# Patient Record
Sex: Female | Born: 1960 | Race: White | Hispanic: No | Marital: Single | Smoking: Never smoker
Health system: Southern US, Community
[De-identification: ages and names within clinical notes are randomized; demographics above are authoritative.]

## PROBLEM LIST (undated history)

## (undated) DIAGNOSIS — E785 Hyperlipidemia, unspecified: Secondary | ICD-10-CM

## (undated) DIAGNOSIS — I639 Cerebral infarction, unspecified: Secondary | ICD-10-CM

## (undated) DIAGNOSIS — H547 Unspecified visual loss: Secondary | ICD-10-CM

## (undated) DIAGNOSIS — I219 Acute myocardial infarction, unspecified: Secondary | ICD-10-CM

## (undated) DIAGNOSIS — E119 Type 2 diabetes mellitus without complications: Secondary | ICD-10-CM

## (undated) DIAGNOSIS — I10 Essential (primary) hypertension: Secondary | ICD-10-CM

## (undated) DIAGNOSIS — Z8669 Personal history of other diseases of the nervous system and sense organs: Secondary | ICD-10-CM

## (undated) HISTORY — PX: CARDIAC CATHETERIZATION: SHX172

## (undated) HISTORY — PX: TOTAL ABDOMINAL HYSTERECTOMY: SHX209

## (undated) HISTORY — PX: SKIN GRAFT: SHX250

## (undated) HISTORY — PX: FRACTURE SURGERY: SHX138

## (undated) HISTORY — PX: BACK SURGERY: SHX140

## (undated) HISTORY — PX: ABDOMINAL HYSTERECTOMY: SHX81

---

## 2011-02-07 DIAGNOSIS — D069 Carcinoma in situ of cervix, unspecified: Secondary | ICD-10-CM

## 2011-02-07 DIAGNOSIS — E782 Mixed hyperlipidemia: Secondary | ICD-10-CM | POA: Insufficient documentation

## 2011-02-07 DIAGNOSIS — R55 Syncope and collapse: Secondary | ICD-10-CM | POA: Insufficient documentation

## 2011-02-07 DIAGNOSIS — G43719 Chronic migraine without aura, intractable, without status migrainosus: Secondary | ICD-10-CM | POA: Insufficient documentation

## 2011-02-07 DIAGNOSIS — IMO0001 Reserved for inherently not codable concepts without codable children: Secondary | ICD-10-CM | POA: Insufficient documentation

## 2011-02-07 DIAGNOSIS — G4700 Insomnia, unspecified: Secondary | ICD-10-CM | POA: Insufficient documentation

## 2011-02-07 DIAGNOSIS — F411 Generalized anxiety disorder: Secondary | ICD-10-CM | POA: Insufficient documentation

## 2011-02-07 DIAGNOSIS — G4733 Obstructive sleep apnea (adult) (pediatric): Secondary | ICD-10-CM | POA: Insufficient documentation

## 2011-02-07 HISTORY — DX: Carcinoma in situ of cervix, unspecified: D06.9

## 2012-04-02 DIAGNOSIS — Z86711 Personal history of pulmonary embolism: Secondary | ICD-10-CM | POA: Insufficient documentation

## 2012-04-02 DIAGNOSIS — I2699 Other pulmonary embolism without acute cor pulmonale: Secondary | ICD-10-CM | POA: Insufficient documentation

## 2012-04-02 HISTORY — DX: Personal history of pulmonary embolism: Z86.711

## 2012-04-03 DIAGNOSIS — I82409 Acute embolism and thrombosis of unspecified deep veins of unspecified lower extremity: Secondary | ICD-10-CM | POA: Insufficient documentation

## 2012-04-03 HISTORY — DX: Acute embolism and thrombosis of unspecified deep veins of unspecified lower extremity: I82.409

## 2012-06-04 DIAGNOSIS — E669 Obesity, unspecified: Secondary | ICD-10-CM | POA: Insufficient documentation

## 2012-06-04 DIAGNOSIS — E66812 Obesity, class 2: Secondary | ICD-10-CM | POA: Insufficient documentation

## 2012-06-04 DIAGNOSIS — R002 Palpitations: Secondary | ICD-10-CM | POA: Insufficient documentation

## 2012-07-07 DIAGNOSIS — M25511 Pain in right shoulder: Secondary | ICD-10-CM | POA: Insufficient documentation

## 2012-07-07 DIAGNOSIS — M7541 Impingement syndrome of right shoulder: Secondary | ICD-10-CM | POA: Insufficient documentation

## 2013-06-10 DIAGNOSIS — E039 Hypothyroidism, unspecified: Secondary | ICD-10-CM | POA: Diagnosis present

## 2013-07-11 DIAGNOSIS — I10 Essential (primary) hypertension: Secondary | ICD-10-CM | POA: Diagnosis present

## 2013-08-22 DIAGNOSIS — M25562 Pain in left knee: Secondary | ICD-10-CM | POA: Insufficient documentation

## 2013-08-22 DIAGNOSIS — Z79899 Other long term (current) drug therapy: Secondary | ICD-10-CM | POA: Insufficient documentation

## 2013-10-27 DIAGNOSIS — I214 Non-ST elevation (NSTEMI) myocardial infarction: Secondary | ICD-10-CM

## 2013-10-27 HISTORY — DX: Non-ST elevation (NSTEMI) myocardial infarction: I21.4

## 2013-12-02 ENCOUNTER — Encounter: Payer: Self-pay | Admitting: Family

## 2014-01-22 DIAGNOSIS — E1169 Type 2 diabetes mellitus with other specified complication: Secondary | ICD-10-CM | POA: Insufficient documentation

## 2014-03-03 DIAGNOSIS — I639 Cerebral infarction, unspecified: Secondary | ICD-10-CM | POA: Insufficient documentation

## 2014-04-22 DIAGNOSIS — H47619 Cortical blindness, unspecified side of brain: Secondary | ICD-10-CM

## 2014-06-13 ENCOUNTER — Emergency Department: Payer: Self-pay | Admitting: Emergency Medicine

## 2014-06-13 LAB — URINALYSIS, COMPLETE
BILIRUBIN, UR: NEGATIVE
Glucose,UR: NEGATIVE mg/dL (ref 0–75)
Ketone: NEGATIVE
Nitrite: NEGATIVE
Ph: 5 (ref 4.5–8.0)
Protein: 100
RBC,UR: 6504 /HPF (ref 0–5)
SQUAMOUS EPITHELIAL: NONE SEEN
Specific Gravity: 1.012 (ref 1.003–1.030)
WBC UR: 149 /HPF (ref 0–5)

## 2014-06-14 ENCOUNTER — Emergency Department: Payer: Self-pay | Admitting: Emergency Medicine

## 2014-06-15 LAB — URINE CULTURE

## 2014-06-30 ENCOUNTER — Emergency Department: Payer: Self-pay | Admitting: Emergency Medicine

## 2014-07-01 LAB — URINALYSIS, COMPLETE
BILIRUBIN, UR: NEGATIVE
Bacteria: NONE SEEN
Glucose,UR: NEGATIVE mg/dL (ref 0–75)
Ketone: NEGATIVE
Nitrite: NEGATIVE
PH: 8 (ref 4.5–8.0)
Protein: NEGATIVE
RBC,UR: 55 /HPF (ref 0–5)
Specific Gravity: 1.012 (ref 1.003–1.030)
Squamous Epithelial: <1

## 2014-07-03 ENCOUNTER — Emergency Department: Payer: Self-pay | Admitting: Emergency Medicine

## 2014-07-03 LAB — URINE CULTURE

## 2014-08-02 ENCOUNTER — Emergency Department: Payer: Self-pay | Admitting: Emergency Medicine

## 2014-08-02 LAB — URINALYSIS, COMPLETE
Bacteria: NONE SEEN
Bilirubin,UR: NEGATIVE
Glucose,UR: NEGATIVE mg/dL (ref 0–75)
Ketone: NEGATIVE
Nitrite: NEGATIVE
Ph: 5 (ref 4.5–8.0)
Protein: 30
RBC,UR: 64 /HPF (ref 0–5)
Specific Gravity: 1.012 (ref 1.003–1.030)
Squamous Epithelial: 2
WBC UR: 408 /HPF (ref 0–5)

## 2014-08-05 ENCOUNTER — Emergency Department: Payer: Self-pay | Admitting: Emergency Medicine

## 2014-08-05 LAB — URINE CULTURE

## 2014-08-05 LAB — URINALYSIS, COMPLETE
Bilirubin,UR: NEGATIVE
GLUCOSE, UR: NEGATIVE mg/dL (ref 0–75)
Ketone: NEGATIVE
NITRITE: NEGATIVE
PH: 8 (ref 4.5–8.0)
Specific Gravity: 1.026 (ref 1.003–1.030)

## 2014-09-18 ENCOUNTER — Emergency Department: Payer: Self-pay | Admitting: Emergency Medicine

## 2014-12-18 DIAGNOSIS — K219 Gastro-esophageal reflux disease without esophagitis: Secondary | ICD-10-CM | POA: Insufficient documentation

## 2014-12-21 ENCOUNTER — Emergency Department: Payer: Medicare Other

## 2014-12-21 ENCOUNTER — Other Ambulatory Visit: Payer: Self-pay

## 2014-12-21 ENCOUNTER — Encounter: Payer: Self-pay | Admitting: *Deleted

## 2014-12-21 ENCOUNTER — Emergency Department
Admission: EM | Admit: 2014-12-21 | Discharge: 2014-12-21 | Disposition: A | Discharge status: Home / Self Care | Payer: Medicare Other | Attending: Emergency Medicine | Admitting: Emergency Medicine

## 2014-12-21 DIAGNOSIS — I10 Essential (primary) hypertension: Secondary | ICD-10-CM | POA: Insufficient documentation

## 2014-12-21 DIAGNOSIS — E119 Type 2 diabetes mellitus without complications: Secondary | ICD-10-CM | POA: Insufficient documentation

## 2014-12-21 DIAGNOSIS — M6281 Muscle weakness (generalized): Secondary | ICD-10-CM | POA: Diagnosis present

## 2014-12-21 DIAGNOSIS — G459 Transient cerebral ischemic attack, unspecified: Secondary | ICD-10-CM | POA: Diagnosis not present

## 2014-12-21 HISTORY — DX: Cerebral infarction, unspecified: I63.9

## 2014-12-21 HISTORY — DX: Essential (primary) hypertension: I10

## 2014-12-21 HISTORY — DX: Type 2 diabetes mellitus without complications: E11.9

## 2014-12-21 NOTE — ED Provider Notes (Signed)
[facility] Emergency Department Provider Note  ____________________________________________  Time seen: 10:30 AM  I have reviewed the triage vital signs and the nursing notes.   HISTORY  Chief Complaint Weakness     HPI Victoria Medina is a 54 y.o. female who presents after having an episode of worsening numbness on her left side today. 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 Today she noted to the nurse that the left side of her body felt more numb than usual but had no motor changes. She reports she has a chronic left facial droop. She is angry that she is in the emergency department she feels the nurse overreacted. She feels at baseline currently. Discussed with nurse at [facility] who also agrees chronic left facial droop     Past Medical History  Diagnosis Date  . Stroke   . Diabetes mellitus without complication   . Hypertension     There are no active problems to display for this patient.   History reviewed. No pertinent past surgical history.  No current outpatient prescriptions on file.  Allergies Review of patient's allergies indicates no known allergies.  No family history on file.  Social History History  Substance Use Topics  . Smoking status: Never Smoker   . Smokeless tobacco: Not on file  . Alcohol Use: No    Review of Systems  Constitutional: Negative for fever. Eyes: Legally blind ENT: Negative for sore throat. Cardiovascular: Negative for chest pain. Respiratory: Negative for shortness of breath. Gastrointestinal: Negative for abdominal pain, vomiting and diarrhea. Genitourinary: Negative for dysuria. Musculoskeletal: Negative for back pain. Skin: Negative for rash. Neurological: Negative for headaches, left-sided numbness resolved Psychiatric: No anxiety or depression  10-point ROS otherwise  negative.  ____________________________________________   PHYSICAL EXAM:  VITAL SIGNS: BP 144/90 mmHg  Pulse 72  Temp(Src) 98 F (36.7 C) (Oral)  Resp 22  SpO2 99%   ED Triage Vitals  Enc Vitals Group     BP --      Pulse --      Resp --      Temp --      Temp src --      SpO2 --      Weight --      Height --      Head Cir --      Peak Flow --      Pain Score --      Pain Loc --      Pain Edu? --      Excl. in GC? --      Constitutional: Alert and oriented. Well appearing and in no distress. Eyes: Conjunctivae are normal. Normal extraocular movements. ENT   Head: Normocephalic and atraumatic.   Nose: No congestion/rhinnorhea.   Mouth/Throat: Mucous membranes are moist.   Neck: No stridor. Hematological/Lymphatic/Immunilogical: No cervical lymphadenopathy. Cardiovascular: Normal rate, regular rhythm. Normal and symmetric distal pulses are present in all extremities. No murmurs, rubs, or gallops. Respiratory: Normal respiratory effort without tachypnea nor retractions. Breath sounds are clear and equal bilaterally. No wheezes/rales/rhonchi. Gastrointestinal: Soft and nontender. No distention. There is no CVA tenderness. Genitourinary: deferred Musculoskeletal: Nontender with normal range of motion in all extremities. No joint effusions.  No lower extremity tenderness nor edema. Neurologic:  Normal speech and language. Patient with left lower facial droop which probably is chronic. Speech is normal.  Skin:  Skin is warm, dry and intact. No rash noted. Psychiatric: Mood and affect are normal. Speech and behavior are normal. Patient exhibits appropriate insight and judgment.  ____________________________________________    LABS (pertinent positives/negatives)  Patient refuses to allow blood draw  ____________________________________________   EKG Interpreted by me  Date: 12/21/2014  Rate: 67  Rhythm: normal sinus rhythm  QRS Axis: normal   Intervals: normal  ST/T Wave abnormalities: normal  Conduction Disutrbances: none  Narrative Interpretation: unremarkable      ____________________________________________    RADIOLOGY  CT head with age indeterminate infarct  ____________________________________________   PROCEDURES  Procedure(s) performed:None  Critical Care performed: None    ____________________________________________   INITIAL IMPRESSION / ASSESSMENT AND PLAN / ED COURSE  Pertinent labs & imaging results that were available during my care of the patient were reviewed by me and considered in my medical decision making (see chart for details).  Patient does not feel that she has had a stroke and I don't believe so either. She is adamant that she feels at baseline. Apparently her mother is coming and I will discuss with her when she arrives  Patient is not a TPA candidate because minor if any symptoms have already resolved ____________________________________________ Discussed with neurology Dr. Hermenia Bers who notes that given no symptoms and emergency department, and dual antiplatelet therapy and eliquis appropriate blood pressure and patient's resistance to stay in the hospital ok for discharge. Patient has follow-up with her neurologist this week  FINAL CLINICAL IMPRESSION(S) / ED DIAGNOSES  Final diagnoses:  Transient cerebral ischemia, unspecified transient cerebral ischemia type     Jene Every, MD 12/21/14 1219

## 2014-12-21 NOTE — ED Notes (Signed)
Per EMS pt from [facility] with c/o left side weakness, unsure exact onset. Hx of previous stroke. VSS. Possible A-Fib on monitor. BG 333, no hx of diabetes.

## 2014-12-21 NOTE — Discharge Instructions (Signed)
Transient Ischemic Attack °A transient ischemic attack (TIA) is a "warning stroke" that causes stroke-like symptoms. A TIA does not cause lasting damage to the brain. It is important to know when to get help and what to do to prevent stroke or death.  °HOME CARE  °· Take all medicines exactly as told by your doctor. Understand all your medicine instructions. °· You may need to take aspirin or warfarin medicine. Take warfarin exactly as told. °¨ Taking too much or too little warfarin is dangerous. Blood tests must be done as often as told by your doctor. These blood tests help your doctor make sure the amount of warfarin you are taking is right. A PT blood test measures how long it takes for blood to clot. Your PT is used to calculate another value called an INR. Your PT and INR help your doctor adjust your warfarin dosage. °¨ Food can cause problems with warfarin and affect the results of your blood tests. This is true for foods high in vitamin K. Spinach, kale, broccoli, cabbage, collard and turnip greens, Brussels sprouts, peas, cauliflower, seaweed, and parsley are high in vitamin K as well as beef and pork liver, green tea, and soybean oil. Eat the same amount of food high in vitamin K. Avoid major changes in your diet. Tell your doctor before changing your diet. Talk to a food specialist (dietitian) if you have questions. °¨ Many medicines can cause problems with warfarin and affect your PT and INR. Tell your doctor about all medicines you take. This includes vitamins and dietary pills (supplements). Be careful with aspirin and medicines that relieve redness, soreness, and puffiness (inflammation). Do not take or stop medicines unless your doctor tells you to. °¨ Warfarin can cause a lot of bruising or bleeding. Hold pressure over cuts for longer than normal. Talk to your doctor about other side effects of warfarin. °¨ Avoid sports or activities that may cause injury or bleeding. °¨ Be careful when you shave,  floss your teeth, or use sharp objects. °¨ Avoid alcoholic drinks or drink very little alcohol while taking warfarin. Tell your doctor if you change how much alcohol you drink. °¨ Tell your dentist and other doctors that you take warfarin before procedures. °· Eat 5 or more servings of fruits and vegetables a day. °· Follow your diet program as told, if you are given one. °· Keep a healthy weight. °· Stay active. Try to get at least 30 minutes of activity on most or all days. °· Do not smoke. °· Limit how much alcohol you drink even if you are not taking warfarin. Moderate alcohol use is: °¨ No more than 2 drinks each day for men. °¨ No more than 1 drink each day for women who are not pregnant. °· Stop abusing drugs. °· Keep your home safe so you do not fall. Try: °¨ Putting grab bars in the bedroom and bathroom. °¨ Raising toilet seats. °¨ Putting a seat in the shower. °· Keep all doctor visits a told. °GET HELP IF: °· Your personality changes. °· You have trouble swallowing. °· You are seeing two of everything. °· You are dizzy. °· You have a fever. °· Your skin starts to break down. °GET HELP RIGHT AWAY IF:  °The symptoms below may be a sign of an emergency. Do not wait to see if the symptoms go away. Call for help (911 in [place] Do not drive yourself to the hospital. °· You have sudden weakness or numbness on   the face, arm, or leg (especially on one side of the body). °· You have sudden trouble walking or moving your arms or legs. °· You have sudden confusion. °· You have trouble talking or understanding. °· You have sudden trouble seeing in one or both eyes. °· You lose your balance or your movements are not smooth. °· You have a sudden, severe headache with no known cause. °· You have new chest pain or you feel your heart beating in a unsteady way. °· You are partly or totally unaware of what is going on around you. °MAKE SURE YOU:  °· Understand these instructions. °· Will watch your condition. °· Will get  help right away if you are not doing well or get worse. °Document Released: 04/25/2008 Document Revised: 12/01/2013 Document Reviewed: 10/22/2013 °ExitCare® Patient Information ©2015 [employer]. This information is not intended to replace advice given to you by your health care provider. Make sure you discuss any questions you have with your health care provider. ° °

## 2015-02-22 ENCOUNTER — Other Ambulatory Visit: Payer: Self-pay

## 2015-02-22 ENCOUNTER — Emergency Department: Payer: Medicare Other

## 2015-02-22 ENCOUNTER — Emergency Department
Admission: EM | Admit: 2015-02-22 | Discharge: 2015-02-22 | Disposition: A | Discharge status: Other Acute Inpatient Hospital | Payer: Medicare Other | Attending: Emergency Medicine | Admitting: Emergency Medicine

## 2015-02-22 DIAGNOSIS — M6281 Muscle weakness (generalized): Secondary | ICD-10-CM | POA: Diagnosis not present

## 2015-02-22 DIAGNOSIS — I69328 Other speech and language deficits following cerebral infarction: Secondary | ICD-10-CM | POA: Insufficient documentation

## 2015-02-22 DIAGNOSIS — I10 Essential (primary) hypertension: Secondary | ICD-10-CM | POA: Diagnosis not present

## 2015-02-22 DIAGNOSIS — N390 Urinary tract infection, site not specified: Secondary | ICD-10-CM | POA: Insufficient documentation

## 2015-02-22 DIAGNOSIS — E119 Type 2 diabetes mellitus without complications: Secondary | ICD-10-CM | POA: Insufficient documentation

## 2015-02-22 DIAGNOSIS — R410 Disorientation, unspecified: Secondary | ICD-10-CM | POA: Insufficient documentation

## 2015-02-22 DIAGNOSIS — I69398 Other sequelae of cerebral infarction: Secondary | ICD-10-CM | POA: Insufficient documentation

## 2015-02-22 DIAGNOSIS — R4182 Altered mental status, unspecified: Secondary | ICD-10-CM | POA: Diagnosis present

## 2015-02-22 DIAGNOSIS — R479 Unspecified speech disturbances: Secondary | ICD-10-CM

## 2015-02-22 DIAGNOSIS — F809 Developmental disorder of speech and language, unspecified: Secondary | ICD-10-CM

## 2015-02-22 LAB — URINALYSIS COMPLETE WITH MICROSCOPIC (ARMC ONLY)
Bilirubin Urine: NEGATIVE
Glucose, UA: NEGATIVE mg/dL
HGB URINE DIPSTICK: NEGATIVE
KETONES UR: NEGATIVE mg/dL
NITRITE: POSITIVE — AB
Protein, ur: NEGATIVE mg/dL
SPECIFIC GRAVITY, URINE: 1.004 — AB (ref 1.005–1.030)
pH: 6 (ref 5.0–8.0)

## 2015-02-22 LAB — COMPREHENSIVE METABOLIC PANEL
ALT: 18 U/L (ref 14–54)
AST: 23 U/L (ref 15–41)
Albumin: 4.1 g/dL (ref 3.5–5.0)
Alkaline Phosphatase: 103 U/L (ref 38–126)
Anion gap: 11 (ref 5–15)
BUN: 12 mg/dL (ref 6–20)
CO2: 28 mmol/L (ref 22–32)
Calcium: 9.1 mg/dL (ref 8.9–10.3)
Chloride: 102 mmol/L (ref 101–111)
Creatinine, Ser: 0.84 mg/dL (ref 0.44–1.00)
GFR calc Af Amer: >60 mL/min (ref >60)
GFR calc non Af Amer: >60 mL/min (ref >60)
Glucose, Bld: 169 mg/dL — ABNORMAL HIGH (ref 65–99)
POTASSIUM: 4.1 mmol/L (ref 3.5–5.1)
SODIUM: 141 mmol/L (ref 135–145)
TOTAL PROTEIN: 7.1 g/dL (ref 6.5–8.1)
Total Bilirubin: 0.4 mg/dL (ref 0.3–1.2)

## 2015-02-22 LAB — CBC WITH DIFFERENTIAL/PLATELET
BASOS ABS: 0 10*3/uL (ref 0–0.1)
Basophils Relative: 1 %
EOS PCT: 2 %
Eosinophils Absolute: 0.2 10*3/uL (ref 0–0.7)
HCT: 37.6 % (ref 35.0–47.0)
HEMOGLOBIN: 12.4 g/dL (ref 12.0–16.0)
LYMPHS ABS: 1.8 10*3/uL (ref 1.0–3.6)
Lymphocytes Relative: 18 %
MCH: 27.6 pg (ref 26.0–34.0)
MCHC: 33.1 g/dL (ref 32.0–36.0)
MCV: 83.4 fL (ref 80.0–100.0)
Monocytes Absolute: 0.7 10*3/uL (ref 0.2–0.9)
Monocytes Relative: 7 %
Neutro Abs: 7.3 10*3/uL — ABNORMAL HIGH (ref 1.4–6.5)
Neutrophils Relative %: 72 %
Platelets: 302 10*3/uL (ref 150–440)
RBC: 4.51 MIL/uL (ref 3.80–5.20)
RDW: 14.5 % (ref 11.5–14.5)
WBC: 10.1 10*3/uL (ref 3.6–11.0)

## 2015-02-22 LAB — APTT: APTT: 26 s (ref 24–36)

## 2015-02-22 MED ORDER — CEPHALEXIN 500 MG PO CAPS: 500.0000 mg | ORAL_CAPSULE | Freq: Three times a day (TID) | ORAL | Status: AC

## 2015-02-22 NOTE — ED Notes (Signed)
fsbs by ems 170 per dr. Zenda Alpers.

## 2015-02-22 NOTE — Discharge Instructions (Signed)
Please seek medical attention for any high fevers, chest pain, shortness of breath, change in behavior, persistent vomiting, bloody stool or any other new or concerning symptoms. ° °Urinary Tract Infection °Urinary tract infections (UTIs) can develop anywhere along your urinary tract. Your urinary tract is your body's drainage system for removing wastes and extra water. Your urinary tract includes two kidneys, two ureters, a bladder, and a urethra. Your kidneys are a pair of bean-shaped organs. Each kidney is about the size of your fist. They are located below your ribs, one on each side of your spine. °CAUSES °Infections are caused by microbes, which are microscopic organisms, including fungi, viruses, and bacteria. These organisms are so small that they can only be seen through a microscope. Bacteria are the microbes that most commonly cause UTIs. °SYMPTOMS  °Symptoms of UTIs may vary by age and gender of the patient and by the location of the infection. Symptoms in young women typically include a frequent and intense urge to urinate and a painful, burning feeling in the bladder or urethra during urination. Older women and men are more likely to be tired, shaky, and weak and have muscle aches and abdominal pain. A fever may mean the infection is in your kidneys. Other symptoms of a kidney infection include pain in your back or sides below the ribs, nausea, and vomiting. °DIAGNOSIS °To diagnose a UTI, your caregiver will ask you about your symptoms. Your caregiver also will ask to provide a urine sample. The urine sample will be tested for bacteria and white blood cells. White blood cells are made by your body to help fight infection. °TREATMENT  °Typically, UTIs can be treated with medication. Because most UTIs are caused by a bacterial infection, they usually can be treated with the use of antibiotics. The choice of antibiotic and length of treatment depend on your symptoms and the type of bacteria causing your  infection. °HOME CARE INSTRUCTIONS °· If you were prescribed antibiotics, take them exactly as your caregiver instructs you. Finish the medication even if you feel better after you have only taken some of the medication. °· Drink enough water and fluids to keep your urine clear or pale yellow. °· Avoid caffeine, tea, and carbonated beverages. They tend to irritate your bladder. °· Empty your bladder often. Avoid holding urine for long periods of time. °· Empty your bladder before and after sexual intercourse. °· After a bowel movement, women should cleanse from front to back. Use each tissue only once. °SEEK MEDICAL CARE IF:  °· You have back pain. °· You develop a fever. °· Your symptoms do not begin to resolve within 3 days. °SEEK IMMEDIATE MEDICAL CARE IF:  °· You have severe back pain or lower abdominal pain. °· You develop chills. °· You have nausea or vomiting. °· You have continued burning or discomfort with urination. °MAKE SURE YOU:  °· Understand these instructions. °· Will watch your condition. °· Will get help right away if you are not doing well or get worse. °Document Released: 04/26/2005 Document Revised: 01/16/2012 Document Reviewed: 08/25/2011 °ExitCare® Patient Information ©2015 [employer]. This information is not intended to replace advice given to you by your health care provider. Make sure you discuss any questions you have with your health care provider. ° °

## 2015-02-22 NOTE — ED Provider Notes (Signed)
-----------------------------------------   10:38 AM on 02/22/2015 -----------------------------------------  Urinalysis concerning for urinary tract infection. Will plan on discharging him back to living facility with antibiotic prescription.  Phineas Semen, MD 02/22/15 1038

## 2015-02-22 NOTE — ED Provider Notes (Signed)
[facility] Emergency Department Provider Note  ____________________________________________  Time seen: Approximately 6:06 AM  I have reviewed the triage vital signs and the nursing notes.   HISTORY  Chief Complaint Altered Mental Status    HPI Victoria Medina is a 54 y.o. female who was brought in from her assisted living facility with confusion. According to EMS this morning the patient was getting into the shower and seemed confused. The patient has had some strokes in the past that affects her left side. According to EMS the staff reported that the patient would not respond like normal and seemed to be staring off. EMS reports that the patient was able to answer questions appropriately with them and did not seem to have any difficulty. The patient reports that she does not remember exactly what happened this morning but felt that she had difficulty speaking. The patient reports that the symptoms have resolved. She does have some baseline left sided weakness due to her 2 previous strokes. The patient reports that she also has blindness due to her strokes.   Past Medical History  Diagnosis Date  . Stroke   . Diabetes mellitus without complication   . Hypertension     There are no active problems to display for this patient.   No past surgical history on file.  No current outpatient prescriptions on file.  Allergies Review of patient's allergies indicates no known allergies.  No family history on file.  Social History History  Substance Use Topics  . Smoking status: Never Smoker   . Smokeless tobacco: Not on file  . Alcohol Use: No    Review of Systems Constitutional: No fever/chills Eyes: No visual changes. ENT: No sore throat. Cardiovascular: Denies chest pain. Respiratory: Denies shortness of breath. Gastrointestinal: No abdominal pain.  No nausea, no vomiting.  No diarrhea.  No constipation. Genitourinary: Negative for  dysuria. Musculoskeletal: Negative for back pain. Skin: Negative for rash. Neurological: Speech difficulties and left-sided baseline weakness with no headache  10-point ROS otherwise negative.  ____________________________________________   PHYSICAL EXAM:  VITAL SIGNS: ED Triage Vitals  Enc Vitals Group     BP 02/22/15 0616 123/98 mmHg     Pulse Rate 02/22/15 0616 77     Resp 02/22/15 0616 14     Temp 02/22/15 0616 97.8 F (36.6 C)     Temp Source 02/22/15 0616 Oral     SpO2 02/22/15 0616 98 %     Weight 02/22/15 0616 182 lb 3 oz (82.64 kg)     Height 02/22/15 0616  (1.549 m)     Head Cir --      Peak Flow --      Pain Score --      Pain Loc --      Pain Edu? --      Excl. in GC? --     Constitutional: Alert and oriented to person and location unsure of full date. Well appearing and in no acute distress. Eyes: Conjunctivae are normal. Patient with history of blindness Head: Atraumatic. Nose: No congestion/rhinnorhea. Mouth/Throat: Mucous membranes are moist.  Oropharynx non-erythematous. Cardiovascular: Normal rate, regular rhythm. Grossly normal heart sounds.  Good peripheral circulation. Respiratory: Normal respiratory effort.  No retractions. Lungs CTAB. Gastrointestinal: Soft and nontender. No distention. Positive bowel sounds Genitourinary: Deferred Musculoskeletal: No lower extremity tenderness nor edema.  No joint effusions. Neurologic:  Normal speech and language. Patient with contractures to left upper extremity, weakness also in left upper extremity patient with  some decreased sensation as well on the left side. Otherwise good strength in right upper and lower extremity patient with left-sided facial droop which is baseline from previous strokes Skin:  Skin is warm, dry and intact. No rash noted. Psychiatric: Mood and affect are normal.  ____________________________________________   LABS (all labs ordered are listed, but only abnormal results are  displayed)  Labs Reviewed  CBC WITH DIFFERENTIAL/PLATELET - Abnormal; Notable for the following:    Neutro Abs 7.3 (*)    All other components within normal limits  APTT  COMPREHENSIVE METABOLIC PANEL  URINALYSIS COMPLETEWITH MICROSCOPIC ([facility] ONLY)   ____________________________________________  EKG  ED ECG REPORT I, Rebecka Apley, the attending physician, personally viewed and interpreted this ECG.   Date: 02/22/2015  EKG Time: 617  Rate: 76  Rhythm: normal sinus rhythm  Axis: normal  Intervals:none  ST&T Change: none  ____________________________________________  RADIOLOGY  CT head:  ____________________________________________   PROCEDURES  Procedure(s) performed: None  Critical Care performed: No  ____________________________________________   INITIAL IMPRESSION / ASSESSMENT AND PLAN / ED COURSE  Pertinent labs & imaging results that were available during my care of the patient were reviewed by me and considered in my medical decision making (see chart for details).  His is a 54 year old female with a history of 2 strokes who comes in tonight with some difficulty with speech. The patient is at her baseline now does not have any difficulty with word finding has some left-sided weakness but again that is her baseline from her previous strokes. We will check some blood work as well as the patient's urine for possible infection and do a CT scan of her head. We will determine if the patient has any infection that may be causing some reemergence of her stroke symptoms or if there may be another cause to the symptoms. The patient's care was signed out to Dr. Phineas Semen who will follow-up the results of the patient's blood work, urinalysis and CT head. ____________________________________________   FINAL CLINICAL IMPRESSION(S) / ED DIAGNOSES  Final diagnoses:  Confusion  Speech and language deficits      Rebecka Apley, MD 02/22/15 [phone]

## 2015-02-22 NOTE — ED Notes (Signed)
Called and left message with Crystal employee of [facility] assisted [facility] to inform that pt was ready for discharge

## 2015-02-22 NOTE — ED Notes (Signed)
Spoke with Crystal at [facility] and advised that pt was ready to be discharged, Crystal advised that she was at a Dr.'s appointment and when she was finished she would be to get pt

## 2015-02-22 NOTE — ED Notes (Signed)
Pt \\from  asssisted living facility per ems. Per ems staff at facility was changing pt when they felt she became confused and was not responding to them as per normal. Pt is currently alert and oriented x3, previous cva affecting left side and causing blindness. Pt with clear speech. Moves right arm and leg without difficulty.

## 2015-02-24 LAB — URINE CULTURE

## 2015-05-04 ENCOUNTER — Emergency Department: Payer: Medicare Other

## 2015-05-04 ENCOUNTER — Encounter: Payer: Self-pay | Admitting: Radiology

## 2015-05-04 ENCOUNTER — Emergency Department
Admission: EM | Admit: 2015-05-04 | Discharge: 2015-05-04 | Disposition: A | Discharge status: Home / Self Care | Payer: Medicare Other | Attending: Student | Admitting: Student

## 2015-05-04 ENCOUNTER — Other Ambulatory Visit: Payer: Self-pay

## 2015-05-04 DIAGNOSIS — N390 Urinary tract infection, site not specified: Secondary | ICD-10-CM | POA: Insufficient documentation

## 2015-05-04 DIAGNOSIS — Z91040 Latex allergy status: Secondary | ICD-10-CM | POA: Diagnosis not present

## 2015-05-04 DIAGNOSIS — Z79899 Other long term (current) drug therapy: Secondary | ICD-10-CM | POA: Insufficient documentation

## 2015-05-04 DIAGNOSIS — Y998 Other external cause status: Secondary | ICD-10-CM | POA: Diagnosis not present

## 2015-05-04 DIAGNOSIS — Y9389 Activity, other specified: Secondary | ICD-10-CM | POA: Diagnosis not present

## 2015-05-04 DIAGNOSIS — Z043 Encounter for examination and observation following other accident: Secondary | ICD-10-CM | POA: Insufficient documentation

## 2015-05-04 DIAGNOSIS — E119 Type 2 diabetes mellitus without complications: Secondary | ICD-10-CM | POA: Insufficient documentation

## 2015-05-04 DIAGNOSIS — Z7902 Long term (current) use of antithrombotics/antiplatelets: Secondary | ICD-10-CM | POA: Insufficient documentation

## 2015-05-04 DIAGNOSIS — W1839XA Other fall on same level, initial encounter: Secondary | ICD-10-CM | POA: Diagnosis not present

## 2015-05-04 DIAGNOSIS — Z7982 Long term (current) use of aspirin: Secondary | ICD-10-CM | POA: Insufficient documentation

## 2015-05-04 DIAGNOSIS — Y92128 Other place in nursing home as the place of occurrence of the external cause: Secondary | ICD-10-CM | POA: Diagnosis not present

## 2015-05-04 DIAGNOSIS — H548 Legal blindness, as defined in USA: Secondary | ICD-10-CM | POA: Diagnosis not present

## 2015-05-04 DIAGNOSIS — Z7951 Long term (current) use of inhaled steroids: Secondary | ICD-10-CM | POA: Insufficient documentation

## 2015-05-04 DIAGNOSIS — Z7901 Long term (current) use of anticoagulants: Secondary | ICD-10-CM | POA: Insufficient documentation

## 2015-05-04 DIAGNOSIS — R4182 Altered mental status, unspecified: Secondary | ICD-10-CM | POA: Diagnosis present

## 2015-05-04 DIAGNOSIS — I69992 Facial weakness following unspecified cerebrovascular disease: Secondary | ICD-10-CM | POA: Insufficient documentation

## 2015-05-04 DIAGNOSIS — I10 Essential (primary) hypertension: Secondary | ICD-10-CM | POA: Insufficient documentation

## 2015-05-04 LAB — CBC WITH DIFFERENTIAL/PLATELET
Basophils Absolute: 0 10*3/uL (ref 0–0.1)
Basophils Relative: 0 %
Eosinophils Absolute: 0.2 10*3/uL (ref 0–0.7)
Eosinophils Relative: 2 %
HEMATOCRIT: 40.2 % (ref 35.0–47.0)
Hemoglobin: 13.2 g/dL (ref 12.0–16.0)
Lymphocytes Relative: 18 %
Lymphs Abs: 2.1 10*3/uL (ref 1.0–3.6)
MCH: 27.4 pg (ref 26.0–34.0)
MCHC: 32.8 g/dL (ref 32.0–36.0)
MCV: 83.5 fL (ref 80.0–100.0)
MONO ABS: 0.7 10*3/uL (ref 0.2–0.9)
MONOS PCT: 6 %
NEUTROS ABS: 8.5 10*3/uL — AB (ref 1.4–6.5)
Neutrophils Relative %: 74 %
Platelets: 317 10*3/uL (ref 150–440)
RBC: 4.82 MIL/uL (ref 3.80–5.20)
RDW: 14.5 % (ref 11.5–14.5)
WBC: 11.6 10*3/uL — ABNORMAL HIGH (ref 3.6–11.0)

## 2015-05-04 LAB — COMPREHENSIVE METABOLIC PANEL
ALBUMIN: 4 g/dL (ref 3.5–5.0)
ALT: 14 U/L (ref 14–54)
ANION GAP: 10 (ref 5–15)
AST: 17 U/L (ref 15–41)
Alkaline Phosphatase: 99 U/L (ref 38–126)
BILIRUBIN TOTAL: 0.7 mg/dL (ref 0.3–1.2)
BUN: 9 mg/dL (ref 6–20)
CO2: 28 mmol/L (ref 22–32)
Calcium: 9.3 mg/dL (ref 8.9–10.3)
Chloride: 104 mmol/L (ref 101–111)
Creatinine, Ser: 0.73 mg/dL (ref 0.44–1.00)
GFR calc non Af Amer: >60 mL/min (ref >60)
Glucose, Bld: 110 mg/dL — ABNORMAL HIGH (ref 65–99)
POTASSIUM: 4.3 mmol/L (ref 3.5–5.1)
Sodium: 142 mmol/L (ref 135–145)
Total Protein: 7.4 g/dL (ref 6.5–8.1)

## 2015-05-04 LAB — URINALYSIS COMPLETE WITH MICROSCOPIC (ARMC ONLY)
Bilirubin Urine: NEGATIVE
GLUCOSE, UA: NEGATIVE mg/dL
HGB URINE DIPSTICK: NEGATIVE
KETONES UR: NEGATIVE mg/dL
NITRITE: POSITIVE — AB
PROTEIN: NEGATIVE mg/dL
RBC / HPF: NONE SEEN RBC/hpf (ref 0–5)
SPECIFIC GRAVITY, URINE: 1.003 — AB (ref 1.005–1.030)
pH: 6 (ref 5.0–8.0)

## 2015-05-04 LAB — TROPONIN I: Troponin I: <0.03 ng/mL (ref <0.031)

## 2015-05-04 MED ORDER — NITROFURANTOIN MONOHYD MACRO 100 MG PO CAPS
100.0000 mg | ORAL_CAPSULE | Freq: Once | ORAL | Status: AC
  Administered 2015-05-04: 100 mg via ORAL
  Filled 2015-05-04: qty 1

## 2015-05-04 MED ORDER — NITROFURANTOIN MONOHYD MACRO 100 MG PO CAPS: 100.0000 mg | ORAL_CAPSULE | Freq: Two times a day (BID) | ORAL | Status: AC

## 2015-05-04 NOTE — ED Notes (Signed)
Unable to draw labs. Tommy,medic, casey,rn, and myself all tried multiple times. Dr. Inocencio Homes notified. Lab called and to bedside to draw labs.

## 2015-05-04 NOTE — ED Provider Notes (Signed)
[facility] Emergency Department Provider Note  ____________________________________________  Time seen: On EMS arrival  I have reviewed the triage vital signs and the nursing notes.   HISTORY  Chief Complaint Altered Mental Status    HPI Victoria Medina is a 54 y.o. female with history of multiple CVAs with residual left-sided deficit, legal blindness, diabetes, hypertension who presents for evaluation of worsening confusion over the past several days, intermittent, gradual onset. Patient reports that she has felt "more confused lately". She lives at [facility]. Earlier this morning she was attempting to get up and slid out of her chair but she denies any pain complaints or injury as a result of that fall. She did not hit her head or lose consciousness. Her assisted living facility sent her here this afternoon for further evaluation because of concern for worsening confusion. The patient has no other complaints at this time. She does not feel confused at this moment. Currently her symptoms are mild. No modifying factors to she denies any chest pain, difficulty breathing, vomiting, diarrhea, fevers or chills. She denies any pain or burning with urination though she does have a history of frequent urinary tract infections. No modifying factors. She has otherwise been in her usual state of health.   Past Medical History  Diagnosis Date  . Stroke (HCC)   . Diabetes mellitus without complication (HCC)   . Hypertension     There are no active problems to display for this patient.   History reviewed. No pertinent past surgical history.  Current Outpatient Rx  Name  Route  Sig  Dispense  Refill  . acetaminophen (TYLENOL) 325 MG tablet   Oral   Take 650 mg by mouth every 6 (six) hours as needed for mild pain, fever or headache.          Marland Kitchen apixaban (ELIQUIS) 2.5 MG TABS tablet   Oral   Take 2.5 mg by mouth 2 (two) times daily.          Marland Kitchen aspirin EC 81 MG tablet   Oral   Take 81 mg by mouth daily.         Marland Kitchen atorvastatin (LIPITOR) 20 MG tablet   Oral   Take 20 mg by mouth at bedtime.         . bisacodyl (DULCOLAX) 10 MG suppository   Rectal   Place 10 mg rectally daily as needed for moderate constipation.         . busPIRone (BUSPAR) 10 MG tablet   Oral   Take 10 mg by mouth 2 (two) times daily.         . clonazePAM (KLONOPIN) 1 MG tablet   Oral   Take 1 mg by mouth every 8 (eight) hours as needed for anxiety.          . clopidogrel (PLAVIX) 75 MG tablet   Oral   Take 75 mg by mouth daily.         Marland Kitchen dexlansoprazole (DEXILANT) 60 MG capsule   Oral   Take 60 mg by mouth daily.         Marland Kitchen ezetimibe (ZETIA) 10 MG tablet   Oral   Take 10 mg by mouth daily.         . ferrous sulfate 325 (65 FE) MG tablet   Oral   Take 325 mg by mouth daily.         . fluticasone (FLONASE) 50 MCG/ACT nasal spray  Each Nare   Place 2 sprays into both nostrils 2 (two) times daily.          Marland Kitchen levothyroxine (SYNTHROID, LEVOTHROID) 75 MCG tablet   Oral   Take 75 mcg by mouth daily before breakfast.         . magnesium oxide (MAG-OX) 400 MG tablet   Oral   Take 400 mg by mouth 2 (two) times daily.         . metFORMIN (GLUCOPHAGE) 500 MG tablet   Oral   Take 500 mg by mouth 2 (two) times daily.         . metoprolol (LOPRESSOR) 100 MG tablet   Oral   Take 100 mg by mouth 2 (two) times daily.         . mirtazapine (REMERON) 15 MG tablet   Oral   Take 22.5 mg by mouth at bedtime.         . nitroGLYCERIN (NITROSTAT) 0.4 MG SL tablet   Sublingual   Place 0.4 mg under the tongue every 5 (five) minutes as needed for chest pain.          Marland Kitchen nystatin (MYCOSTATIN) powder   Topical   Apply 1 application topically 3 (three) times daily as needed (for rash in groin or on breasts).          . ondansetron (ZOFRAN) 4 MG tablet   Oral   Take 4 mg by mouth 3 (three) times daily as needed for  nausea or vomiting.          . polyethylene glycol (MIRALAX / GLYCOLAX) packet   Oral   Take 17 g by mouth daily.         Marland Kitchen Propylene Glycol (SYSTANE BALANCE) 0.6 % SOLN   Ophthalmic   Apply 1 drop to eye 4 (four) times daily as needed (for dry eyes).          . pseudoephedrine (SUDAFED) 30 MG tablet   Oral   Take 30 mg by mouth every 6 (six) hours as needed for congestion.          . risperiDONE (RISPERDAL) 2 MG tablet   Oral   Take 2 mg by mouth at bedtime.         . senna-docusate (SENOKOT-S) 8.6-50 MG tablet   Oral   Take 2 tablets by mouth 2 (two) times daily.         . Suvorexant 5 MG TABS   Oral   Take 5 mg by mouth at bedtime as needed (for sleep).          . tamsulosin (FLOMAX) 0.4 MG CAPS capsule   Oral   Take 0.4 mg by mouth daily.         Marland Kitchen tiZANidine (ZANAFLEX) 2 MG tablet   Oral   Take 2 mg by mouth 3 (three) times daily as needed for muscle spasms.         . vitamin B-12 (CYANOCOBALAMIN) 1000 MCG tablet   Oral   Take 1,000 mcg by mouth daily.           Allergies Levaquin and Latex  History reviewed. No pertinent family history.  Social History Social History  Substance Use Topics  . Smoking status: Never Smoker   . Smokeless tobacco: None  . Alcohol Use: No    Review of Systems Constitutional: No fever/chills Eyes: No visual changes. ENT: No sore throat. Cardiovascular: Denies chest pain. Respiratory: Denies shortness of breath. Gastrointestinal: No  abdominal pain.  No nausea, no vomiting.  No diarrhea.  No constipation. Genitourinary: Negative for dysuria. Musculoskeletal: Negative for back pain. Skin: Negative for rash. Neurological: Negative for headaches, positive for chronic focal weakness numbness of the left arm, leg and face  10-point ROS otherwise negative.  ____________________________________________   PHYSICAL EXAM:  VITAL SIGNS: ED Triage Vitals  Enc Vitals Group     BP --      Pulse Rate  05/04/15 1529 78     Resp --      Temp 05/04/15 1529 97.7 F (36.5 C)     Temp Source 05/04/15 1529 Oral     SpO2 05/04/15 1529 99 %     Weight 05/04/15 1529 180 lb (81.647 kg)     Height 05/04/15 1529  (1.549 m)     Head Cir --      Peak Flow --      Pain Score --      Pain Loc --      Pain Edu? --      Excl. in GC? --     Constitutional: Alert and oriented. Well appearing and in no acute distress. Eyes: Conjunctivae are normal. PERRL. EOMI. Head: Atraumatic. Nose: No congestion/rhinnorhea. Mouth/Throat: Mucous membranes are moist.  Oropharynx non-erythematous. Neck: No stridor.no midline C-spine tenderness to palpation.  Cardiovascular: Normal rate, regular rhythm. Grossly normal heart sounds.  Good peripheral circulation. Respiratory: Normal respiratory effort.  No retractions. Lungs CTAB. Gastrointestinal: Soft and nontender. No distention. No CVA tenderness. Genitourinary: deferred Musculoskeletal: No lower extremity tenderness nor edema.  No joint effusions. pelvis stable to rock and compression. No midline tenderness to palpation throat the T or L-spine.  Neurologic:  Normal speech and language. 4-5 strength in the left upper extremity and the left lower extremity as well as left facial droop which patient reports is chronic, 5 out of 5 strength in all other extremities. Decreased sensation to light touch in the left face, left arm, left leg, sensation intact on the right.  Skin:  Skin is warm, dry and intact. No rash noted. Psychiatric: Mood and affect are normal. Speech and behavior are normal.  ____________________________________________   LABS (all labs ordered are listed, but only abnormal results are displayed)  Labs Reviewed  CBC WITH DIFFERENTIAL/PLATELET - Abnormal; Notable for the following:    WBC 11.6 (*)    Neutro Abs 8.5 (*)    All other components within normal limits  COMPREHENSIVE METABOLIC PANEL - Abnormal; Notable for the following:     Glucose, Bld 110 (*)    All other components within normal limits  URINALYSIS COMPLETEWITH MICROSCOPIC ([facility] ONLY) - Abnormal; Notable for the following:    Color, Urine YELLOW (*)    APPearance CLEAR (*)    Specific Gravity, Urine 1.003 (*)    Nitrite POSITIVE (*)    Leukocytes, UA TRACE (*)    Bacteria, UA RARE (*)    Squamous Epithelial / LPF 0-5 (*)    All other components within normal limits  URINE CULTURE  TROPONIN I   ____________________________________________  EKG  ED ECG REPORT I, Gayla Doss, the attending physician, personally viewed and interpreted this ECG.   Date: 05/04/2015  EKG Time: 16:45  Rate: 77  Rhythm: normal sinus rhythm  Axis: normal  Intervals:none  ST&T Change: No acute ST change. Q waves in lead 3, aVF. T-wave inversions in V2, V3. Overall, EKG is similar to 02/22/2015  ____________________________________________  RADIOLOGY  CT head IMPRESSION:  Old bilateral occipital infarcts and thalamic lacunar infarcts. No acute intracranial abnormality.    CXR IMPRESSION: No acute cardiopulmonary abnormality seen. ____________________________________________   PROCEDURES  Procedure(s) performed: None  Critical Care performed: No  ____________________________________________   INITIAL IMPRESSION / ASSESSMENT AND PLAN / ED COURSE  Pertinent labs & imaging results that were available during my care of the patient were reviewed by me and considered in my medical decision making (see chart for details).  Victoria Medina is a 54 y.o. female with history of multiple CVAs with residual left-sided deficit, diabetes, hypertension who presents for evaluation of worsening confusion. On exam, she is very well-appearing and in no acute distress. Vital signs stable, she is afebrile. She is alert and oriented 4 with normal speech, normal language, old chronic strength sensation deficits in the left side which she reports are not worse from baseline. She  is debating whether not she wants anything done stating that she "really just wants to go home". She does not appear confused at this time and finally has agreed to evaluation/workup so we will obtain basic labs, EKG, chest x-ray, CT head and urinalysis. Reassess for disposition.   ----------------------------------------- 7:55 PM on 05/04/2015 -----------------------------------------  Patient remains alert, oriented, pain free, vital signs stable. Labs reviewed and are notable for mild leukocytosis with white blood cell count 11.6. Normal CMP. Troponin negative. Chest x-ray negative for any acute crit pulmonary abnormality. CT head unchanged from prior. Urinalysis is consistent with nitrite positive urinary tract infection. GFR, we'll treat with Macrobid and send culture. The patient is requesting discharge at this time stating that she feels well. DC back to [facility] with return precautions and close PCP follow-up. ____________________________________________   FINAL CLINICAL IMPRESSION(S) / ED DIAGNOSES  Final diagnoses:  UTI (lower urinary tract infection)      Gayla Doss, MD 05/04/15 [phone]

## 2015-05-04 NOTE — ED Notes (Signed)
Lab notified to add urine culture, spoke with Lelon Mast, states will add.

## 2015-05-04 NOTE — ED Notes (Signed)
Ems from [facility] for increasing confusion and s/p fall from recliner this am.

## 2015-05-06 LAB — URINE CULTURE
Culture: >=100000
SPECIAL REQUESTS: NORMAL

## 2015-07-21 ENCOUNTER — Emergency Department
Admission: EM | Admit: 2015-07-21 | Discharge: 2015-07-21 | Disposition: A | Discharge status: Home / Self Care | Payer: Medicare Other | Attending: Emergency Medicine | Admitting: Emergency Medicine

## 2015-07-21 DIAGNOSIS — R531 Weakness: Secondary | ICD-10-CM | POA: Diagnosis not present

## 2015-07-21 DIAGNOSIS — Z91040 Latex allergy status: Secondary | ICD-10-CM | POA: Insufficient documentation

## 2015-07-21 DIAGNOSIS — E119 Type 2 diabetes mellitus without complications: Secondary | ICD-10-CM | POA: Diagnosis not present

## 2015-07-21 DIAGNOSIS — I10 Essential (primary) hypertension: Secondary | ICD-10-CM | POA: Diagnosis not present

## 2015-07-21 DIAGNOSIS — R410 Disorientation, unspecified: Secondary | ICD-10-CM | POA: Diagnosis present

## 2015-07-21 HISTORY — DX: Unspecified visual loss: H54.7

## 2015-07-21 LAB — URINALYSIS COMPLETE WITH MICROSCOPIC (ARMC ONLY)
BACTERIA UA: NONE SEEN
BILIRUBIN URINE: NEGATIVE
Glucose, UA: NEGATIVE mg/dL
Hgb urine dipstick: NEGATIVE
KETONES UR: NEGATIVE mg/dL
Nitrite: NEGATIVE
PH: 7 (ref 5.0–8.0)
Protein, ur: NEGATIVE mg/dL
Specific Gravity, Urine: 1.004 — ABNORMAL LOW (ref 1.005–1.030)

## 2015-07-21 LAB — CBC WITH DIFFERENTIAL/PLATELET
BASOS ABS: 0 10*3/uL (ref 0–0.1)
Basophils Relative: 0 %
EOS PCT: 3 %
Eosinophils Absolute: 0.3 10*3/uL (ref 0–0.7)
HEMATOCRIT: 40.8 % (ref 35.0–47.0)
HEMOGLOBIN: 12.9 g/dL (ref 12.0–16.0)
LYMPHS ABS: 2 10*3/uL (ref 1.0–3.6)
LYMPHS PCT: 17 %
MCH: 26.6 pg (ref 26.0–34.0)
MCHC: 31.6 g/dL — ABNORMAL LOW (ref 32.0–36.0)
MCV: 84 fL (ref 80.0–100.0)
Monocytes Absolute: 1.1 10*3/uL — ABNORMAL HIGH (ref 0.2–0.9)
Monocytes Relative: 9 %
NEUTROS ABS: 8.3 10*3/uL — AB (ref 1.4–6.5)
NEUTROS PCT: 71 %
PLATELETS: 316 10*3/uL (ref 150–440)
RBC: 4.85 MIL/uL (ref 3.80–5.20)
RDW: 15.5 % — ABNORMAL HIGH (ref 11.5–14.5)
WBC: 11.7 10*3/uL — AB (ref 3.6–11.0)

## 2015-07-21 LAB — COMPREHENSIVE METABOLIC PANEL
ALK PHOS: 98 U/L (ref 38–126)
ALT: 12 U/L — AB (ref 14–54)
AST: 14 U/L — AB (ref 15–41)
Albumin: 3.9 g/dL (ref 3.5–5.0)
Anion gap: 11 (ref 5–15)
BUN: 14 mg/dL (ref 6–20)
CALCIUM: 9.3 mg/dL (ref 8.9–10.3)
CHLORIDE: 103 mmol/L (ref 101–111)
CO2: 26 mmol/L (ref 22–32)
CREATININE: 0.77 mg/dL (ref 0.44–1.00)
GFR calc Af Amer: >60 mL/min (ref >60)
Glucose, Bld: 151 mg/dL — ABNORMAL HIGH (ref 65–99)
Potassium: 4.4 mmol/L (ref 3.5–5.1)
Sodium: 140 mmol/L (ref 135–145)
Total Bilirubin: 0.4 mg/dL (ref 0.3–1.2)
Total Protein: 7.1 g/dL (ref 6.5–8.1)

## 2015-07-21 MED ORDER — DIAZEPAM 5 MG PO TABS
ORAL_TABLET | ORAL | Status: AC
  Filled 2015-07-21: qty 1

## 2015-07-21 MED ORDER — MUPIROCIN 2 % EX OINT: TOPICAL_OINTMENT | CUTANEOUS | Status: AC

## 2015-07-21 NOTE — ED Notes (Signed)
Pt comes into the ED via EMS from [facility] with c/o from staff that the pt has had some confusion, possible UTI, states this happens when she had a UTI in the past. Pt is a/o to place and name. Pt is blind

## 2015-07-21 NOTE — ED Provider Notes (Signed)
[facility] Emergency Department Provider Note     Time seen: ----------------------------------------- 11:14 AM on 07/21/2015 -----------------------------------------    I have reviewed the triage vital signs and the nursing notes.   HISTORY  Chief Complaint Urinary Tract Infection    HPI Victoria Medina is a 54 y.o. female who presents ER being brought by EMS for some confusion. She was thought to possibly have a UTI as this happens when she gets UTI in the past. She is alert and oriented to place and time, states she is blind. Patient denies any complaints at this time.   Past Medical History  Diagnosis Date  . Stroke (HCC)   . Diabetes mellitus without complication (HCC)   . Hypertension     There are no active problems to display for this patient.   No past surgical history on file.  Allergies Levaquin and Latex  Social History Social History  Substance Use Topics  . Smoking status: Never Smoker   . Smokeless tobacco: Not on file  . Alcohol Use: No    Review of Systems Constitutional: Negative for fever. Eyes: Negative for visual changes. ENT: Negative for sore throat. Cardiovascular: Negative for chest pain. Respiratory: Negative for shortness of breath. Gastrointestinal: Negative for abdominal pain, vomiting and diarrhea. Genitourinary: Negative for dysuria. Musculoskeletal: Negative for back pain. Skin: Negative for rash. Neurological: Negative for headaches, focal weakness or numbness.  10-point ROS otherwise negative.  ____________________________________________   PHYSICAL EXAM:  VITAL SIGNS: ED Triage Vitals  Enc Vitals Group     BP --      Pulse --      Resp --      Temp --      Temp src --      SpO2 --      Weight --      Height --      Head Cir --      Peak Flow --      Pain Score --      Pain Loc --      Pain Edu? --      Excl. in GC? --     Constitutional: Alert and oriented, in no  distress. ENT   Head: Normocephalic and atraumatic.   Nose: No congestion/rhinnorhea.   Mouth/Throat: Mucous membranes are moist.   Neck: No stridor. Cardiovascular: Normal rate, regular rhythm. Normal and symmetric distal pulses are present in all extremities. No murmurs, rubs, or gallops. Respiratory: Normal respiratory effort without tachypnea nor retractions. Breath sounds are clear and equal bilaterally. No wheezes/rales/rhonchi. Gastrointestinal: Soft and nontender. No distention. No abdominal bruits.  Musculoskeletal: No joint effusions.  No lower extremity tenderness nor edema. Neurologic:  Normal speech and language. No gross focal neurologic deficits are appreciated. Speech is normal.  Skin:  Skin is warm, dry and intact. No rash noted. Psychiatric: Mood and affect are normal. ____________________________________________  EKG: Interpreted by me. Normal sinus rhythm with a rate of 60 bpm, normal PR interval, normal QS with, normal QT interval. Nonspecific T-wave changes  ____________________________________________  ED COURSE:  Pertinent labs & imaging results that were available during my care of the patient were reviewed by me and considered in my medical decision making (see chart for details). We'll check basic labs and reevaluate. ____________________________________________    LABS (pertinent positives/negatives)  Labs Reviewed  CBC WITH DIFFERENTIAL/PLATELET - Abnormal; Notable for the following:    WBC 11.7 (*)    MCHC 31.6 (*)    RDW 15.5 (*)  Neutro Abs 8.3 (*)    Monocytes Absolute 1.1 (*)    All other components within normal limits  COMPREHENSIVE METABOLIC PANEL - Abnormal; Notable for the following:    Glucose, Bld 151 (*)    AST 14 (*)    ALT 12 (*)    All other components within normal limits  URINALYSIS COMPLETEWITH MICROSCOPIC ([facility] ONLY) - Abnormal; Notable for the following:    Color, Urine STRAW (*)    APPearance CLEAR (*)     Specific Gravity, Urine 1.004 (*)    Leukocytes, UA TRACE (*)    Squamous Epithelial / LPF 0-5 (*)    All other components within normal limits    ____________________________________________  FINAL ASSESSMENT AND PLAN  Weakness  Plan: Patient with labs and imaging as dictated above. Minimal leukocytosis, she is stable for outpatient follow-up with clear urine and grossly unremarkable lab work. Patient still denies complaints.   Emily Filbert, MD   Emily Filbert, MD 07/21/15 1226

## 2015-07-21 NOTE — Discharge Instructions (Signed)

## 2015-11-22 ENCOUNTER — Emergency Department: Payer: Medicare Other

## 2015-11-22 ENCOUNTER — Emergency Department
Admission: EM | Admit: 2015-11-22 | Discharge: 2015-11-22 | Disposition: A | Discharge status: Home / Self Care | Payer: Medicare Other | Attending: Emergency Medicine | Admitting: Emergency Medicine

## 2015-11-22 ENCOUNTER — Encounter: Payer: Self-pay | Admitting: Emergency Medicine

## 2015-11-22 DIAGNOSIS — I69354 Hemiplegia and hemiparesis following cerebral infarction affecting left non-dominant side: Secondary | ICD-10-CM | POA: Insufficient documentation

## 2015-11-22 DIAGNOSIS — Z91040 Latex allergy status: Secondary | ICD-10-CM | POA: Diagnosis not present

## 2015-11-22 DIAGNOSIS — E119 Type 2 diabetes mellitus without complications: Secondary | ICD-10-CM | POA: Insufficient documentation

## 2015-11-22 DIAGNOSIS — I10 Essential (primary) hypertension: Secondary | ICD-10-CM | POA: Diagnosis not present

## 2015-11-22 DIAGNOSIS — R200 Anesthesia of skin: Secondary | ICD-10-CM

## 2015-11-22 DIAGNOSIS — Z7982 Long term (current) use of aspirin: Secondary | ICD-10-CM | POA: Diagnosis not present

## 2015-11-22 DIAGNOSIS — Z7951 Long term (current) use of inhaled steroids: Secondary | ICD-10-CM | POA: Insufficient documentation

## 2015-11-22 DIAGNOSIS — Z7984 Long term (current) use of oral hypoglycemic drugs: Secondary | ICD-10-CM | POA: Diagnosis not present

## 2015-11-22 DIAGNOSIS — Z79899 Other long term (current) drug therapy: Secondary | ICD-10-CM | POA: Diagnosis not present

## 2015-11-22 DIAGNOSIS — Z7902 Long term (current) use of antithrombotics/antiplatelets: Secondary | ICD-10-CM | POA: Diagnosis not present

## 2015-11-22 LAB — URINALYSIS COMPLETE WITH MICROSCOPIC (ARMC ONLY)
BILIRUBIN URINE: NEGATIVE
Bacteria, UA: NONE SEEN
GLUCOSE, UA: NEGATIVE mg/dL
KETONES UR: NEGATIVE mg/dL
Nitrite: NEGATIVE
Protein, ur: NEGATIVE mg/dL
SPECIFIC GRAVITY, URINE: 1.005 (ref 1.005–1.030)
pH: 6 (ref 5.0–8.0)

## 2015-11-22 LAB — DIFFERENTIAL
BASOS ABS: 0.1 10*3/uL (ref 0–0.1)
Basophils Relative: 1 %
Eosinophils Absolute: 0.1 10*3/uL (ref 0–0.7)
Eosinophils Relative: 1 %
LYMPHS PCT: 18 %
Lymphs Abs: 2.2 10*3/uL (ref 1.0–3.6)
MONO ABS: 1 10*3/uL — AB (ref 0.2–0.9)
MONOS PCT: 8 %
NEUTROS ABS: 8.6 10*3/uL — AB (ref 1.4–6.5)
Neutrophils Relative %: 72 %

## 2015-11-22 LAB — CBC
HEMATOCRIT: 39.3 % (ref 35.0–47.0)
Hemoglobin: 13.2 g/dL (ref 12.0–16.0)
MCH: 27.6 pg (ref 26.0–34.0)
MCHC: 33.5 g/dL (ref 32.0–36.0)
MCV: 82.4 fL (ref 80.0–100.0)
Platelets: 339 10*3/uL (ref 150–440)
RBC: 4.78 MIL/uL (ref 3.80–5.20)
RDW: 15.7 % — ABNORMAL HIGH (ref 11.5–14.5)
WBC: 11.9 10*3/uL — AB (ref 3.6–11.0)

## 2015-11-22 LAB — COMPREHENSIVE METABOLIC PANEL
ALK PHOS: 91 U/L (ref 38–126)
ALT: 10 U/L — AB (ref 14–54)
AST: 17 U/L (ref 15–41)
Albumin: 3.8 g/dL (ref 3.5–5.0)
Anion gap: 11 (ref 5–15)
BUN: 9 mg/dL (ref 6–20)
CALCIUM: 9 mg/dL (ref 8.9–10.3)
CO2: 27 mmol/L (ref 22–32)
CREATININE: 0.7 mg/dL (ref 0.44–1.00)
Chloride: 103 mmol/L (ref 101–111)
GFR calc non Af Amer: >60 mL/min (ref >60)
Glucose, Bld: 125 mg/dL — ABNORMAL HIGH (ref 65–99)
Potassium: 4.2 mmol/L (ref 3.5–5.1)
SODIUM: 141 mmol/L (ref 135–145)
Total Bilirubin: 0.6 mg/dL (ref 0.3–1.2)
Total Protein: 6.8 g/dL (ref 6.5–8.1)

## 2015-11-22 LAB — APTT: APTT: 25 s (ref 24–36)

## 2015-11-22 LAB — TROPONIN I

## 2015-11-22 LAB — PROTIME-INR
INR: 1.03
PROTHROMBIN TIME: 13.7 s (ref 11.4–15.0)

## 2015-11-22 NOTE — ED Notes (Signed)
Patient transported to MRI, Mayra transported and will sit with pt at MRI per MRI tech request.

## 2015-11-22 NOTE — ED Notes (Addendum)
Arrived to CT at 1517

## 2015-11-22 NOTE — ED Notes (Signed)
IN/Out cath, assisted by Sunny Schlein, RN

## 2015-11-22 NOTE — ED Notes (Signed)
Unable to collect blood at this time with 2 attempts

## 2015-11-22 NOTE — ED Notes (Signed)
Lab notified to send phlebotomy tech collect blood, states will send.

## 2015-11-22 NOTE — Discharge Instructions (Signed)
There are many causes of numbness in the legs. Today, there is no evidence for new stroke. Please follow-up with your primary care physician for further evaluation.  Return to the emergency department if you develop headache, vision or speech changes, nausea or vomiting, and new numbness tingling or weakness, or any other symptoms concerning to you.  Paresthesia Paresthesia is an abnormal burning or prickling sensation. This sensation is generally felt in the hands, arms, legs, or feet. However, it may occur in any part of the body. Usually, it is not painful. The feeling may be described as:  Tingling or numbness.  Pins and needles.  Skin crawling.  Buzzing.  Limbs falling asleep.  Itching. Most people experience temporary (transient) paresthesia at some time in their lives. Paresthesia may occur when you breathe too quickly (hyperventilation). It can also occur without any apparent cause. Commonly, paresthesia occurs when pressure is placed on a nerve. The sensation quickly goes away after the pressure is removed. For some people, however, paresthesia is a long-lasting (chronic) condition that is caused by an underlying disorder. If you continue to have paresthesia, you may need further medical evaluation. HOME CARE INSTRUCTIONS Watch your condition for any changes. Taking the following actions may help to lessen any discomfort that you are feeling:  Avoid drinking alcohol.  Try acupuncture or massage to help relieve your symptoms.  Keep all follow-up visits as directed by your health care provider. This is important. SEEK MEDICAL CARE IF:  You continue to have episodes of paresthesia.  Your burning or prickling feeling gets worse when you walk.  You have pain, cramps, or dizziness.  You develop a rash. SEEK IMMEDIATE MEDICAL CARE IF:  You feel weak.  You have trouble walking or moving.  You have problems with speech, understanding, or vision.  You feel confused.  You  cannot control your bladder or bowel movements.  You have numbness after an injury.  You faint.   This information is not intended to replace advice given to you by your health care provider. Make sure you discuss any questions you have with your health care provider.   Document Released: 07/07/2002 Document Revised: 12/01/2014 Document Reviewed: 07/13/2014 Elsevier Interactive Patient Education [employer]

## 2015-11-22 NOTE — ED Notes (Signed)
Pt presents to ED via EMS with c/o left leg numbness onset around noon today. No other complaints. Pt from [facility] living.

## 2015-11-22 NOTE — ED Provider Notes (Signed)
[facility] Emergency Department Provider Note  ____________________________________________  Time seen: Approximately 3:17 PM  I have reviewed the triage vital signs and the nursing notes.   HISTORY  Chief Complaint Numbness    HPI Victoria Medina is a 55 y.o. 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 Patient reports that she was at lunch at noon and she noted that her left leg seemed more numb than its usual baseline.She denies any headache, changes in mental status or speech, or new weakness. She has not had any trauma.   Past Medical History  Diagnosis Date  . Stroke (HCC)   . Diabetes mellitus without complication (HCC)   . Hypertension   . Blind     There are no active problems to display for this patient.   Past Surgical History  Procedure Laterality Date  . Abdominal hysterectomy    . Skin graft Right   . Fracture surgery    . Back surgery      Current Outpatient Rx  Name  Route  Sig  Dispense  Refill  . acetaminophen (TYLENOL) 325 MG tablet   Oral   Take 650 mg by mouth every 6 (six) hours as needed for mild pain, fever or headache.          Marland Kitchen aspirin EC 81 MG tablet   Oral   Take 81 mg by mouth daily.         Marland Kitchen atorvastatin (LIPITOR) 20 MG tablet   Oral   Take 20 mg by mouth at bedtime.         . busPIRone (BUSPAR) 10 MG tablet   Oral   Take 10 mg by mouth 2 (two) times daily.         . clonazePAM (KLONOPIN) 1 MG tablet   Oral   Take 1 mg by mouth every 8 (eight) hours as needed for anxiety.          . clopidogrel (PLAVIX) 75 MG tablet   Oral   Take 75 mg by mouth daily.         Marland Kitchen dexlansoprazole (DEXILANT) 60 MG capsule   Oral   Take 60 mg by mouth daily.         . ferrous sulfate 325 (65 FE) MG tablet   Oral   Take 325 mg by mouth daily.          . fluticasone (FLONASE) 50 MCG/ACT nasal spray   Each Nare   Place 2 sprays into both nostrils 2 (two) times daily.          Marland Kitchen gabapentin (NEURONTIN) 300 MG capsule   Oral   Take 300 mg by mouth 2 (two) times daily.         Marland Kitchen levothyroxine (SYNTHROID, LEVOTHROID) 75 MCG tablet   Oral   Take 75 mcg by mouth daily.          . magnesium oxide (MAG-OX) 400 MG tablet   Oral   Take 400 mg by mouth 2 (two) times daily.         . metFORMIN (GLUCOPHAGE) 500 MG tablet   Oral   Take 500 mg by mouth 2 (two) times daily.         . metoprolol (LOPRESSOR) 100 MG tablet   Oral   Take 100 mg by mouth 2 (two) times daily.         Marland Kitchen  mirtazapine (REMERON) 15 MG tablet   Oral   Take 22.5 mg by mouth at bedtime.         . mupirocin ointment (BACTROBAN) 2 %      Apply to affected area 3 times daily   22 g   0   . nitroGLYCERIN (NITROSTAT) 0.4 MG SL tablet   Sublingual   Place 0.4 mg under the tongue every 5 (five) minutes as needed for chest pain.          Marland Kitchen nystatin (MYCOSTATIN) powder   Topical   Apply 1 application topically 3 (three) times daily as needed (for rash in groin or on breasts).          . ondansetron (ZOFRAN) 4 MG tablet   Oral   Take 4 mg by mouth 3 (three) times daily as needed for nausea or vomiting.          . polyethylene glycol (MIRALAX / GLYCOLAX) packet   Oral   Take 17 g by mouth daily.         Marland Kitchen Propylene Glycol (SYSTANE BALANCE) 0.6 % SOLN   Ophthalmic   Apply 1 drop to eye 4 (four) times daily as needed (for dry eyes).          . pseudoephedrine (SUDAFED) 30 MG tablet   Oral   Take 30 mg by mouth every 6 (six) hours as needed for congestion.          . risperiDONE (RISPERDAL) 2 MG tablet   Oral   Take 2 mg by mouth at bedtime.         Marland Kitchen tiZANidine (ZANAFLEX) 2 MG tablet   Oral   Take 2 mg by mouth 3 (three) times daily as needed for muscle spasms.         . vitamin B-12 (CYANOCOBALAMIN) 1000 MCG tablet    Oral   Take 1,000 mcg by mouth daily.           Allergies Levaquin and Latex  History reviewed. No pertinent family history.  Social History Social History  Substance Use Topics  . Smoking status: Never Smoker   . Smokeless tobacco: None  . Alcohol Use: No    Review of Systems Constitutional: No fever/chills. No lightheadedness or syncope. No trauma. Eyes: Blind at baseline, able to see some shadows and this is unchanged today. ENT: No sore throat. No congestion or rhinorrhea. Cardiovascular: Denies chest pain. Denies palpitations. Respiratory: Denies shortness of breath.  No cough. Gastrointestinal: No abdominal pain.  No nausea, no vomiting.  No diarrhea.  No constipation. Genitourinary: Negative for dysuria. Musculoskeletal: Negative for back pain. Skin: Negative for rash. Neurological: Negative for headaches. No new weakness; patient has baseline left upper extremity weakness with contractures, and left lower extremity weakness. Positive worse left lower extremity numbness compared to baseline.  10-point ROS otherwise negative.  ____________________________________________   PHYSICAL EXAM:  VITAL SIGNS: ED Triage Vitals  Enc Vitals Group     BP --      Pulse --      Resp --      Temp --      Temp src --      SpO2 --      Weight --      Height --      Head Cir --      Peak Flow --      Pain Score --      Pain Loc --  Pain Edu? --      Excl. in GC? --     Constitutional: Alert and oriented. Chronically ill appearing but in no acute distress. Answers questions appropriately. Eyes: Conjunctivae are normal.  EOMI; pupils are equal and symmetric. No scleral icterus. Patient is blind and able to see shadows when I put my fingers in front of her eyes but not otherwise able to make out any other shapes. Head: Atraumatic. Nose: No congestion/rhinnorhea. Mouth/Throat: Mucous membranes are moist.  Neck: No stridor.  Supple.   Cardiovascular: Normal rate,  regular rhythm. No murmurs, rubs or gallops.  Respiratory: Normal respiratory effort.  No accessory muscle use or retractions. Lungs CTAB.  No wheezes, rales or ronchi. Gastrointestinal: Overweight. Soft, nontender and nondistended.  No guarding or rebound.  No peritoneal signs. Musculoskeletal: No LE edema. No ttp in the calves or palpable cords.  Negative Homan's sign. No midline cervical, thoracic or lumbar tenderness to palpation. Neurologic: Alert and oriented 3. Speech is clear. Left-sided facial droop with asymmetric smile. Tongue is midline.  No pronator drift in the right upper extremity.. 5 out of 5 right grip, biceps, triceps, hip flexors, plantar flexion and dorsiflexion. Left upper extremity has significant contractures and she is not able to fully extend it. She has 4 out of 5 left grip strength, 5 out of 5 left biceps strength, and 3 out of 5 left triceps strength; 3 out of 5 left hip flexor strength, 5 out of 5 dorsiflexion and plantarflexion. Normal sensation to light touch in the right upper and lower extremities, and face. Decreased sensation to light touch in the left face, left upper extremity and left lower extremity. Unable to perform heel-to-shin due to left leg weakness. Skin:  Skin is warm, dry and intact. No rash noted. Psychiatric: Mood and affect are normal. Speech and behavior are normal.  Normal judgement.  ____________________________________________   LABS (all labs ordered are listed, but only abnormal results are displayed)  Labs Reviewed  CBC - Abnormal; Notable for the following:    WBC 11.9 (*)    RDW 15.7 (*)    All other components within normal limits  DIFFERENTIAL - Abnormal; Notable for the following:    Neutro Abs 8.6 (*)    Monocytes Absolute 1.0 (*)    All other components within normal limits  COMPREHENSIVE METABOLIC PANEL - Abnormal; Notable for the following:    Glucose, Bld 125 (*)    ALT 10 (*)    All other components within normal limits   URINALYSIS COMPLETEWITH MICROSCOPIC ([facility] ONLY) - Abnormal; Notable for the following:    Color, Urine YELLOW (*)    APPearance CLEAR (*)    Hgb urine dipstick 1+ (*)    Leukocytes, UA 1+ (*)    Squamous Epithelial / LPF 0-5 (*)    All other components within normal limits  APTT  PROTIME-INR  TROPONIN I   ____________________________________________  EKG  ED ECG REPORT I, Rockne Menghini, the attending physician, personally viewed and interpreted this ECG.   Date: 11/22/2015  EKG Time: 1537  Rate: 76  Rhythm: normal sinus rhythm  Axis: Normal  Intervals:none  ST&T Change: No ST elevation. Nonspecific T-wave inversions in V1 and V2.  ____________________________________________  RADIOLOGY  Ct Head Wo Contrast  11/22/2015  CLINICAL DATA:  Code stroke EXAM: CT HEAD WITHOUT CONTRAST TECHNIQUE: Contiguous axial images were obtained from the base of the skull through the vertex without intravenous contrast. COMPARISON:  05/04/2015 FINDINGS: Large old infarcts in  the acceptable lobes bilaterally, stable. Bilateral old thalamic lacunar infarcts. Diffuse cerebral atrophy, advanced for patient's age. No acute infarction or hemorrhage. No acute calvarial abnormality. Paranasal sinuses are clear. IMPRESSION: Old bilateral occipital and thalamic infarcts. Atrophy. No acute findings. Critical Value/emergent results were called by telephone at the time of interpretation on 11/22/2015 at 3:31 pm to Dr. Rockne Menghini , who verbally acknowledged these results. Electronically Signed   By: Charlett Nose M.D.   On: 11/22/2015 15:33   Mr Brain Wo Contrast  11/22/2015  CLINICAL DATA:  Left leg numbness beginning today. EXAM: MRI HEAD WITHOUT CONTRAST TECHNIQUE: Multiplanar, multiecho pulse sequences of the brain and surrounding structures were obtained without intravenous contrast. COMPARISON:  Head CT earlier today FINDINGS: There is no evidence of acute infarct, intracranial hemorrhage,  mass, or midline shift. Chronic bilateral PCA territory infarcts are again noted involving the occipital lobes, posterior temporal lobes, and thalami. There is ex vacuo dilatation of the right greater than left lateral ventricles. Chronic bilateral basal ganglia lacunar infarcts are also present. Prominent CSF spaces overlying the anterior frontal lobes are chronic and may reflect atrophy rather than subdural hygromas. Patchy cerebral white matter T2 hyperintensities are greatest in the periventricular white matter and nonspecific but compatible with mild-to-moderate chronic small vessel ischemic disease. Small chronic infarcts are noted in the right cerebellum. Orbits are unremarkable. No significant inflammatory changes are seen in the paranasal sinuses or mastoid air cells. Abnormal appearance of the distal right vertebral artery may reflect chronic occlusion. Other major intracranial vascular flow voids are preserved. IMPRESSION: 1. No acute intracranial abnormality. 2. Chronic bilateral PCA infarcts. 3. Chronic bilateral basal ganglia and right cerebellar infarcts. 4. 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 Electronically Signed   By: Sebastian Ache M.D.   On: 11/22/2015 20:01    ____________________________________________   PROCEDURES  Procedure(s) performed: None  Critical Care performed: No ____________________________________________   INITIAL IMPRESSION / ASSESSMENT AND PLAN / ED COURSE  Pertinent labs & imaging results that were available during my care of the patient were reviewed by me and considered in my medical decision making (see chart for details).  55 y.o. female with a history of CVA and resulting remote left facial droop, hemiparesis, and left lower extremity numbness presenting with acute worsening of her left lower extremity numbness which started at noon. I am evaluating the patient at 307, and we have missed the window for TPA. In addition, her  acute symptoms are mild and would not significantly impact her chronic neurologic condition. She is not a candidate for TPA, and I have discussed this with the patient.  We will evaluate the patient for stroke, but also consider other causes. I did not see any evidence of low back pain which would be suggestive of a radiculopathy causing her numbness. She does have diabetes so neuropathic pain is possible although acute onset is very unusual. I do not see any evidence of trauma, nor skin infection that would be the cause of the numbness.  After her initial evaluation in the emergency department, I will discuss her case with the neurologist on-call, and make a final disposition at that time.  ----------------------------------------- 6:15 PM on 11/22/2015 ----------------------------------------- The patient's workup in the emergency department is reassuring. Her CT scan does not show any new strokes. Her laboratory studies are reassuring, and I am awaiting her urinalysis. I have talked to the patient and she still feels like her left lower extremity numbness is above her  baseline. However, it is possible that I may be able to obtain an MRI from the emergency department, rather than admitting the patient for further evaluation. If the MRI is negative, I am not sure that admission would add anything more to her clinical course. I will talk to Dr. Thad Ranger, the neurologist on-call, to see if this sounds like a reasonable plan but it is likely that we will be able to disposition this patient from the emergency department rather than admitting her for a test that we can complete here.  ----------------------------------------- 8:16 PM on 11/22/2015 -----------------------------------------  I had spoken with Dr. Thad Ranger who agreed that if MRI is negative here, there is no additional benefit to inpatient hospitalization. At this time, the MRI has been read as negative. The patient's symptoms are unlikely  to be due from a new stroke. We'll plan to discharge her home and have her follow-up with her PMD. She understands return precautions as well as follow-up instructions per  ____________________________________________  FINAL CLINICAL IMPRESSION(S) / ED DIAGNOSES  Final diagnoses:  Numbness of left lower extremity      NEW MEDICATIONS STARTED DURING THIS VISIT:  New Prescriptions   No medications on file     Rockne Menghini, MD 11/22/15 2017

## 2015-12-11 ENCOUNTER — Emergency Department
Admission: EM | Admit: 2015-12-11 | Discharge: 2015-12-11 | Disposition: A | Discharge status: Skilled Nursing Facility | Payer: Medicare Other | Attending: Emergency Medicine | Admitting: Emergency Medicine

## 2015-12-11 ENCOUNTER — Other Ambulatory Visit: Payer: Medicare Other

## 2015-12-11 ENCOUNTER — Encounter: Payer: Self-pay | Admitting: Emergency Medicine

## 2015-12-11 ENCOUNTER — Emergency Department: Payer: Medicare Other

## 2015-12-11 DIAGNOSIS — Z0000 Encounter for general adult medical examination without abnormal findings: Secondary | ICD-10-CM | POA: Diagnosis not present

## 2015-12-11 DIAGNOSIS — I10 Essential (primary) hypertension: Secondary | ICD-10-CM | POA: Diagnosis not present

## 2015-12-11 DIAGNOSIS — R531 Weakness: Secondary | ICD-10-CM | POA: Diagnosis present

## 2015-12-11 DIAGNOSIS — E785 Hyperlipidemia, unspecified: Secondary | ICD-10-CM | POA: Insufficient documentation

## 2015-12-11 DIAGNOSIS — Z91040 Latex allergy status: Secondary | ICD-10-CM | POA: Diagnosis not present

## 2015-12-11 DIAGNOSIS — Z79899 Other long term (current) drug therapy: Secondary | ICD-10-CM | POA: Insufficient documentation

## 2015-12-11 DIAGNOSIS — E119 Type 2 diabetes mellitus without complications: Secondary | ICD-10-CM | POA: Insufficient documentation

## 2015-12-11 DIAGNOSIS — R29810 Facial weakness: Secondary | ICD-10-CM | POA: Diagnosis not present

## 2015-12-11 DIAGNOSIS — Z8673 Personal history of transient ischemic attack (TIA), and cerebral infarction without residual deficits: Secondary | ICD-10-CM | POA: Insufficient documentation

## 2015-12-11 DIAGNOSIS — Z139 Encounter for screening, unspecified: Secondary | ICD-10-CM

## 2015-12-11 DIAGNOSIS — Z7984 Long term (current) use of oral hypoglycemic drugs: Secondary | ICD-10-CM | POA: Insufficient documentation

## 2015-12-11 HISTORY — DX: Hyperlipidemia, unspecified: E78.5

## 2015-12-11 LAB — URINALYSIS COMPLETE WITH MICROSCOPIC (ARMC ONLY)
Bacteria, UA: NONE SEEN
Bilirubin Urine: NEGATIVE
GLUCOSE, UA: NEGATIVE mg/dL
HGB URINE DIPSTICK: NEGATIVE
KETONES UR: NEGATIVE mg/dL
Leukocytes, UA: NEGATIVE
NITRITE: NEGATIVE
Protein, ur: NEGATIVE mg/dL
Specific Gravity, Urine: 1.005 (ref 1.005–1.030)
pH: 6 (ref 5.0–8.0)

## 2015-12-11 LAB — BASIC METABOLIC PANEL
ANION GAP: 12 (ref 5–15)
BUN: 7 mg/dL (ref 6–20)
CALCIUM: 9.2 mg/dL (ref 8.9–10.3)
CHLORIDE: 100 mmol/L — AB (ref 101–111)
CO2: 28 mmol/L (ref 22–32)
Creatinine, Ser: 0.74 mg/dL (ref 0.44–1.00)
GFR calc non Af Amer: >60 mL/min (ref >60)
Glucose, Bld: 113 mg/dL — ABNORMAL HIGH (ref 65–99)
Potassium: 4.1 mmol/L (ref 3.5–5.1)
SODIUM: 140 mmol/L (ref 135–145)

## 2015-12-11 LAB — CBC WITH DIFFERENTIAL/PLATELET
BASOS ABS: 0 10*3/uL (ref 0–0.1)
Eosinophils Absolute: 0.1 10*3/uL (ref 0–0.7)
Eosinophils Relative: 1 %
HEMATOCRIT: 40 % (ref 35.0–47.0)
HEMOGLOBIN: 13.1 g/dL (ref 12.0–16.0)
Lymphocytes Relative: 11 %
Lymphs Abs: 1.5 10*3/uL (ref 1.0–3.6)
MCH: 27.3 pg (ref 26.0–34.0)
MCHC: 32.7 g/dL (ref 32.0–36.0)
MCV: 83.4 fL (ref 80.0–100.0)
Monocytes Absolute: 1 10*3/uL — ABNORMAL HIGH (ref 0.2–0.9)
Monocytes Relative: 7 %
NEUTROS ABS: 10.9 10*3/uL — AB (ref 1.4–6.5)
Neutrophils Relative %: 81 %
Platelets: 330 10*3/uL (ref 150–440)
RBC: 4.8 MIL/uL (ref 3.80–5.20)
RDW: 14.9 % — ABNORMAL HIGH (ref 11.5–14.5)
WBC: 13.5 10*3/uL — ABNORMAL HIGH (ref 3.6–11.0)

## 2015-12-11 LAB — TROPONIN I

## 2015-12-11 NOTE — ED Notes (Signed)
Called and spoke with Vernona Rieger from [facility] and informed her that patient is ready for discharge. Vernona Rieger will come and pick patient up.

## 2015-12-11 NOTE — ED Provider Notes (Signed)
[facility] Emergency Department Provider Note   ____________________________________________  Time seen: Seen upon arrival to the emergency department  I have reviewed the triage vital signs and the nursing notes.   HISTORY  Chief Complaint Weakness   HPI Victoria Medina is a 55 y.o. female with a history of CVA, diabetes and hypertension is presenting today with weakness. Per EMS, the patient has been weak over the last 2 days. She has a history of UTI which the concern they brought her in for. Patient states that she was leaning to the right when she went to the bathroom today which was one of the reasons why her skilled nursing facility staff was concerned. She denies any pain. Says that she doesn't know why she is here and feels fine. Denies any burning with urination. Denies any increased weakness, numbness.   Past Medical History  Diagnosis Date  . Stroke (HCC)   . Diabetes mellitus without complication (HCC)   . Hypertension   . Blind   . Hyperlipemia     There are no active problems to display for this patient.   Past Surgical History  Procedure Laterality Date  . Abdominal hysterectomy    . Skin graft Right   . Fracture surgery    . Back surgery      Current Outpatient Rx  Name  Route  Sig  Dispense  Refill  . acetaminophen (TYLENOL) 325 MG tablet   Oral   Take 650 mg by mouth every 6 (six) hours as needed for mild pain, fever or headache.          Marland Kitchen aspirin EC 81 MG tablet   Oral   Take 81 mg by mouth daily.         Marland Kitchen atorvastatin (LIPITOR) 20 MG tablet   Oral   Take 20 mg by mouth at bedtime.         . busPIRone (BUSPAR) 10 MG tablet   Oral   Take 10 mg by mouth 2 (two) times daily.         . clonazePAM (KLONOPIN) 1 MG tablet   Oral   Take 1 mg by mouth every 8 (eight) hours as needed for anxiety.          . clopidogrel (PLAVIX) 75 MG tablet   Oral   Take 75 mg by mouth daily.         Marland Kitchen dexlansoprazole  (DEXILANT) 60 MG capsule   Oral   Take 60 mg by mouth daily.         . ferrous sulfate 325 (65 FE) MG tablet   Oral   Take 325 mg by mouth daily.         . fluticasone (FLONASE) 50 MCG/ACT nasal spray   Each Nare   Place 2 sprays into both nostrils 2 (two) times daily.          Marland Kitchen gabapentin (NEURONTIN) 300 MG capsule   Oral   Take 300 mg by mouth 2 (two) times daily.         Marland Kitchen levothyroxine (SYNTHROID, LEVOTHROID) 75 MCG tablet   Oral   Take 75 mcg by mouth daily.          . magnesium oxide (MAG-OX) 400 MG tablet   Oral   Take 400 mg by mouth 2 (two) times daily.         . metFORMIN (GLUCOPHAGE) 500 MG tablet   Oral   Take 500 mg by  mouth 2 (two) times daily.         . metoprolol (LOPRESSOR) 100 MG tablet   Oral   Take 100 mg by mouth 2 (two) times daily.         . mirtazapine (REMERON) 15 MG tablet   Oral   Take 22.5 mg by mouth at bedtime.         . mupirocin ointment (BACTROBAN) 2 %      Apply to affected area 3 times daily   22 g   0   . nitroGLYCERIN (NITROSTAT) 0.4 MG SL tablet   Sublingual   Place 0.4 mg under the tongue every 5 (five) minutes as needed for chest pain.          Marland Kitchen nystatin (MYCOSTATIN) powder   Topical   Apply 1 application topically 3 (three) times daily as needed (for rash in groin or on breasts).          . ondansetron (ZOFRAN) 4 MG tablet   Oral   Take 4 mg by mouth 3 (three) times daily as needed for nausea or vomiting.          . polyethylene glycol (MIRALAX / GLYCOLAX) packet   Oral   Take 17 g by mouth daily.         Marland Kitchen Propylene Glycol (SYSTANE BALANCE) 0.6 % SOLN   Ophthalmic   Apply 1 drop to eye 4 (four) times daily as needed (for dry eyes).          . pseudoephedrine (SUDAFED) 30 MG tablet   Oral   Take 30 mg by mouth every 6 (six) hours as needed for congestion.          . risperiDONE (RISPERDAL) 2 MG tablet   Oral   Take 2 mg by mouth at bedtime.         Marland Kitchen tiZANidine (ZANAFLEX) 2  MG tablet   Oral   Take 2 mg by mouth 3 (three) times daily as needed for muscle spasms.         . vitamin B-12 (CYANOCOBALAMIN) 1000 MCG tablet   Oral   Take 1,000 mcg by mouth daily.           Allergies Levaquin and Latex  No family history on file.  Social History Social History  Substance Use Topics  . Smoking status: Never Smoker   . Smokeless tobacco: None  . Alcohol Use: No    Review of Systems Constitutional: No fever/chills Eyes: No visual changes. ENT: No sore throat. Cardiovascular: Denies chest pain. Respiratory: Denies shortness of breath. Gastrointestinal: No abdominal pain.  No nausea, no vomiting.  No diarrhea.  No constipation. Genitourinary: Negative for dysuria. Musculoskeletal: Negative for back pain. Skin: Negative for rash. Neurological: Negative for headaches, numbness.  10-point ROS otherwise negative.  ____________________________________________   PHYSICAL EXAM:  VITAL SIGNS: ED Triage Vitals  Enc Vitals Group     BP 12/11/15 1138 114/81 mmHg     Pulse Rate 12/11/15 1138 66     Resp 12/11/15 1138 18     Temp 12/11/15 1138 97.8 F (36.6 C)     Temp src --      SpO2 12/11/15 1138 97 %     Weight --      Height --      Head Cir --      Peak Flow --      Pain Score 12/11/15 1139 0     Pain Loc --  Pain Edu? --      Excl. in GC? --     Constitutional: Alert and oriented. Well appearing and in no acute distress. Eyes: Conjunctivae are normal. PERRL. EOMI. Head: Atraumatic. Nose: No congestion/rhinnorhea. Mouth/Throat: Mucous membranes are moist.  Oropharynx non-erythematous. Neck: No stridor.   Cardiovascular: Normal rate, regular rhythm. Grossly normal heart sounds.  Respiratory: Normal respiratory effort.  No retractions. Lungs CTAB. Gastrointestinal: Soft and nontender. No distention.  Musculoskeletal: No lower extremity tenderness nor edema.  No joint effusions. Neurologic:  Patient with dysarthria but able to  understand her speech. Left-sided facial droop as well as 3 out of 5 strength to the left hand with 4-5 strength to the left lower extremity. Patient says this is normal for her. Skin:  Skin is warm, dry and intact. No rash noted. Psychiatric: Mood and affect are normal.   ____________________________________________   LABS (all labs ordered are listed, but only abnormal results are displayed)  Labs Reviewed  CBC WITH DIFFERENTIAL/PLATELET - Abnormal; Notable for the following:    WBC 13.5 (*)    RDW 14.9 (*)    Neutro Abs 10.9 (*)    Monocytes Absolute 1.0 (*)    All other components within normal limits  BASIC METABOLIC PANEL - Abnormal; Notable for the following:    Chloride 100 (*)    Glucose, Bld 113 (*)    All other components within normal limits  URINALYSIS COMPLETEWITH MICROSCOPIC ([facility] ONLY) - Abnormal; Notable for the following:    Color, Urine YELLOW (*)    APPearance CLEAR (*)    Squamous Epithelial / LPF 0-5 (*)    All other components within normal limits  TROPONIN I   ____________________________________________  EKG  ED ECG REPORT I, Arelia Longest, the attending physician, personally viewed and interpreted this ECG.   Date: 12/11/2015  EKG Time: 1147  Rate: 67  Rhythm: normal sinus rhythm  Axis: Normal  Intervals:none  ST&T Change: No ST segment elevation or depression. No abnormal T-wave inversion.  ____________________________________________  RADIOLOGY     DG Chest 1 View (Final result) Result time: 12/11/15 12:46:45   Final result by Rad Results In Interface (12/11/15 12:46:45)   Narrative:   CLINICAL DATA: Non ambulatory pt brought form nursing home because she was "leaning to the right." Non smoker.  EXAM: CHEST 1 VIEW  COMPARISON: 05/04/2015  FINDINGS: Lungs are clear. Heart size and mediastinal contours are within normal limits. No effusion. No pneumothorax. Cervical fixation hardware partially  seen.  IMPRESSION: No acute cardiopulmonary disease.   Electronically Signed By: Corlis Leak M.D. On: 12/11/2015 12:46          CT Head Wo Contrast (Final result) Result time: 12/11/15 12:46:00   Final result by Rad Results In Interface (12/11/15 12:46:00)   Narrative:   CLINICAL DATA: Patient brought in by [facility] from [facility], patient was sent out for weakness that staff reports has been going on for 2 days. Patient states that she was sent out because she was leaning to the right when she got up to go to the bathroom  EXAM: CT HEAD WITHOUT CONTRAST  TECHNIQUE: Contiguous axial images were obtained from the base of the skull through the vertex without intravenous contrast.  COMPARISON: MR 11/22/2015 and previous  FINDINGS: Brain: Stable bilateral occipital lobe encephalomalacia. Old bilateral thalamic lacunar infarcts. Mild atrophy with prominence of frontal extra-axial CSF spaces as before. No evidence of acute infarction, hemorrhage, extra-axial collection, ventriculomegaly, or mass effect.  Vascular:  No hyperdense vessel or unexpected calcification. Atherosclerotic and physiologic intracranial calcifications.  Skull: Negative for fracture or focal lesion.  Sinuses/Orbits: No acute findings.  Other: None.  IMPRESSION: 1. Negative for bleed or other acute intracranial process. 2. Stable bilateral occipital and thalamic infarcts.   Electronically Signed By: Corlis Leak M.D. On: 12/11/2015 12:46     ____________________________________________   PROCEDURES  ____________________________________________   INITIAL IMPRESSION / ASSESSMENT AND PLAN / ED COURSE  Pertinent labs & imaging results that were available during my care of the patient were reviewed by me and considered in my medical decision making (see chart for details).  ----------------------------------------- 1:53 PM on  12/11/2015 -----------------------------------------  A reassuring workup. Mild leukocytosis but this appears to be chronic. The patient has no complaints at this time. No obvious infective source and the CAT scan of the brain appears stable. Will be discharged back to her skilled nursing facility. ____________________________________________   FINAL CLINICAL IMPRESSION(S) / ED DIAGNOSES  Weakness. Medical screening exam.    NEW MEDICATIONS STARTED DURING THIS VISIT:  New Prescriptions   No medications on file     Note:  This document was prepared using Dragon voice recognition software and may include unintentional dictation errors.    Myrna Blazer, MD 12/11/15 1357

## 2015-12-11 NOTE — ED Notes (Signed)
Patient brought in by [facility] from [facility], patient was sent out for weakness that staff reports has been going on for 2 days. Patient states that she was sent out because she was leaning to the right when she got up to go to the bathroom. Patient does not appear to be in any distress at this time, breathing is equal and unlabored.

## 2015-12-11 NOTE — Discharge Instructions (Signed)

## 2016-05-09 ENCOUNTER — Emergency Department: Payer: Medicare Other

## 2016-05-09 ENCOUNTER — Emergency Department
Admission: EM | Admit: 2016-05-09 | Discharge: 2016-05-09 | Disposition: A | Discharge status: Skilled Nursing Facility | Payer: Medicare Other | Attending: Emergency Medicine | Admitting: Emergency Medicine

## 2016-05-09 DIAGNOSIS — R410 Disorientation, unspecified: Secondary | ICD-10-CM | POA: Insufficient documentation

## 2016-05-09 DIAGNOSIS — Z91040 Latex allergy status: Secondary | ICD-10-CM | POA: Diagnosis not present

## 2016-05-09 DIAGNOSIS — I10 Essential (primary) hypertension: Secondary | ICD-10-CM | POA: Insufficient documentation

## 2016-05-09 DIAGNOSIS — E119 Type 2 diabetes mellitus without complications: Secondary | ICD-10-CM | POA: Diagnosis not present

## 2016-05-09 DIAGNOSIS — Z7984 Long term (current) use of oral hypoglycemic drugs: Secondary | ICD-10-CM | POA: Diagnosis not present

## 2016-05-09 DIAGNOSIS — R4182 Altered mental status, unspecified: Secondary | ICD-10-CM | POA: Diagnosis present

## 2016-05-09 LAB — COMPREHENSIVE METABOLIC PANEL
ALBUMIN: 4.1 g/dL (ref 3.5–5.0)
ALT: 9 U/L — ABNORMAL LOW (ref 14–54)
ANION GAP: 8 (ref 5–15)
AST: 13 U/L — ABNORMAL LOW (ref 15–41)
Alkaline Phosphatase: 84 U/L (ref 38–126)
BUN: 9 mg/dL (ref 6–20)
CHLORIDE: 104 mmol/L (ref 101–111)
CO2: 29 mmol/L (ref 22–32)
Calcium: 8.7 mg/dL — ABNORMAL LOW (ref 8.9–10.3)
Creatinine, Ser: 0.6 mg/dL (ref 0.44–1.00)
GFR calc non Af Amer: >60 mL/min (ref >60)
Glucose, Bld: 145 mg/dL — ABNORMAL HIGH (ref 65–99)
Potassium: 3.5 mmol/L (ref 3.5–5.1)
SODIUM: 141 mmol/L (ref 135–145)
Total Bilirubin: 0.5 mg/dL (ref 0.3–1.2)
Total Protein: 6.9 g/dL (ref 6.5–8.1)

## 2016-05-09 LAB — URINALYSIS COMPLETE WITH MICROSCOPIC (ARMC ONLY)
BACTERIA UA: NONE SEEN
BILIRUBIN URINE: NEGATIVE
Glucose, UA: NEGATIVE mg/dL
Hgb urine dipstick: NEGATIVE
Ketones, ur: NEGATIVE mg/dL
LEUKOCYTES UA: NEGATIVE
Nitrite: NEGATIVE
PH: 8 (ref 5.0–8.0)
Protein, ur: NEGATIVE mg/dL
RBC / HPF: NONE SEEN RBC/hpf (ref 0–5)
Specific Gravity, Urine: 1.008 (ref 1.005–1.030)

## 2016-05-09 LAB — TROPONIN I: Troponin I: <0.03 ng/mL (ref <0.03)

## 2016-05-09 LAB — CBC
HCT: 37.2 % (ref 35.0–47.0)
Hemoglobin: 12.6 g/dL (ref 12.0–16.0)
MCH: 27.9 pg (ref 26.0–34.0)
MCHC: 33.9 g/dL (ref 32.0–36.0)
MCV: 82.3 fL (ref 80.0–100.0)
PLATELETS: 316 10*3/uL (ref 150–440)
RBC: 4.52 MIL/uL (ref 3.80–5.20)
RDW: 13.8 % (ref 11.5–14.5)
WBC: 12.2 10*3/uL — ABNORMAL HIGH (ref 3.6–11.0)

## 2016-05-09 NOTE — ED Triage Notes (Signed)
Pt arrives via [facility] from [facility] with reports of confusion this afternoon  Pt with a history of CVA approx 2 years ago affecting her left side  She has been living at [facility] since August 2016  Pt reports that today she was confused and could not remember a staff members name   She has been ambulatory at times with one person assist and a gait belt   facial droop present - unknown if it is new

## 2016-05-09 NOTE — ED Notes (Signed)
Patient mother called, with verbal permission from patient, and updated on discharge.

## 2016-05-09 NOTE — ED Provider Notes (Signed)
[facility] Emergency Department Provider Note  Time seen: 5:33 PM  I have reviewed the triage vital signs and the nursing notes.   HISTORY  Chief Complaint Altered Mental Status and Hand Pain    HPI Victoria Medina is a 55 y.o. female with a past medical history of diabetes, hypertension, hyperlipidemia, CVA affecting her left side presents to the emergency department for memory difficulty. According to the patient she currently lives at an assisted living facility, today she was having trouble remembering people's names which is unlike her. Patient states she had a stroke 2 years ago affecting her left face and left upper extremity. She states these symptoms are unchanged. Patient ambulates with assistance, which she states is unchanged. She states the only new symptom today was having trouble remembering people's names. Sensation states she now is feeling largely back to normal. She is alert and oriented 4.  Past Medical History:  Diagnosis Date  . Blind   . Diabetes mellitus without complication (HCC)   . Hyperlipemia   . Hypertension   . Stroke [facility](HCC)     There are no active problems to display for this patient.   Past Surgical History:  Procedure Laterality Date  . ABDOMINAL HYSTERECTOMY    . BACK SURGERY    . FRACTURE SURGERY    . SKIN GRAFT Right     Prior to Admission medications   Medication Sig Start Date End Date Taking? Authorizing Provider  acetaminophen (TYLENOL) 325 MG tablet Take 650 mg by mouth every 6 (six) hours as needed for mild pain, fever or headache.     Historical Provider, MD  atorvastatin (LIPITOR) 20 MG tablet Take 20 mg by mouth every evening.     Historical Provider, MD  busPIRone (BUSPAR) 10 MG tablet Take 10 mg by mouth 2 (two) times daily.    Historical Provider, MD  clonazePAM (KLONOPIN) 0.5 MG tablet Take 0.5 mg by mouth every 8 (eight) hours as needed for anxiety. *limit 3 doses per 24 hour*    Historical Provider, MD   clopidogrel (PLAVIX) 75 MG tablet Take 75 mg by mouth daily.    Historical Provider, MD  dexlansoprazole (DEXILANT) 60 MG capsule Take 60 mg by mouth daily.    Historical Provider, MD  ezetimibe (ZETIA) 10 MG tablet Take 10 mg by mouth daily.    Historical Provider, MD  fluticasone (FLONASE) 50 MCG/ACT nasal spray Place 2 sprays into both nostrils 2 (two) times daily.     Historical Provider, MD  gabapentin (NEURONTIN) 300 MG capsule Take 300 mg by mouth 2 (two) times daily.    Historical Provider, MD  guaiFENesin-dextromethorphan (ROBITUSSIN DM) 100-10 MG/5ML syrup Take 15 mLs by mouth 2 (two) times daily as needed for cough.    Historical Provider, MD  levothyroxine (SYNTHROID, LEVOTHROID) 50 MCG tablet Take 50 mcg by mouth daily before breakfast.    Historical Provider, MD  magnesium oxide (MAG-OX) 400 MG tablet Take 400 mg by mouth daily.     Historical Provider, MD  metFORMIN (GLUCOPHAGE) 500 MG tablet Take 500 mg by mouth 2 (two) times daily.    Historical Provider, MD  metoprolol (LOPRESSOR) 100 MG tablet Take 100 mg by mouth 2 (two) times daily.    Historical Provider, MD  mirtazapine (REMERON) 15 MG tablet Take 22.5 mg by mouth at bedtime.    Historical Provider, MD  mupirocin ointment (BACTROBAN) 2 % Apply to affected area 3 times daily 07/21/15 07/20/16  Emily FilbertJonathan E Williams,  MD  nitroGLYCERIN (NITROSTAT) 0.4 MG SL tablet Place 0.4 mg under the tongue every 5 (five) minutes x 3 doses as needed for chest pain.     Historical Provider, MD  nystatin (MYCOSTATIN) powder Apply 1 application topically 3 (three) times daily as needed (for rash in groin or on breasts).     Historical Provider, MD  ondansetron (ZOFRAN) 4 MG tablet Take 4 mg by mouth 3 (three) times daily as needed for nausea or vomiting.     Historical Provider, MD  POLYETHYLENE GLYCOL 3350 PO Take 17 g by mouth daily. *Mix in 4-6 oz of fluid*    Historical Provider, MD  Propylene Glycol (SYSTANE BALANCE) 0.6 % SOLN Apply 1 drop  to eye 4 (four) times daily as needed (for dry eyes).     Historical Provider, MD  pseudoephedrine (SUDAFED) 30 MG tablet Take 30 mg by mouth every 6 (six) hours as needed for congestion.     Historical Provider, MD  risperiDONE (RISPERDAL) 2 MG tablet Take 2 mg by mouth at bedtime.    Historical Provider, MD  senna-docusate (SENOKOT S) 8.6-50 MG tablet Take 1 tablet by mouth 2 (two) times daily as needed for mild constipation or moderate constipation.    Historical Provider, MD  tamsulosin (FLOMAX) 0.4 MG CAPS capsule Take 0.4 mg by mouth daily.    Historical Provider, MD  tiZANidine (ZANAFLEX) 2 MG tablet Take 2 mg by mouth 3 (three) times daily as needed for muscle spasms.    Historical Provider, MD  vitamin B-12 (CYANOCOBALAMIN) 1000 MCG tablet Take 1,000 mcg by mouth daily.    Historical Provider, MD  Vitamin D, Ergocalciferol, (DRISDOL) 50000 units CAPS capsule Take 50,000 Units by mouth every 7 (seven) days.    Historical Provider, MD    Allergies  Allergen Reactions  . Levaquin [Levofloxacin In D5w] Anaphylaxis and Swelling  . Iodinated Diagnostic Agents Itching    Severe itching  . Latex Dermatitis    No family history on file.  Social History Social History  Substance Use Topics  . Smoking status: Never Smoker  . Smokeless tobacco: Not on file  . Alcohol use No    Review of Systems Constitutional: Negative for fever. Cardiovascular: Negative for chest pain. Respiratory: Negative for shortness of breath. Gastrointestinal: Negative for abdominal pain Musculoskeletal: Negative for back pain Neurological: Negative for Headache. 10-point ROS otherwise negative.  ____________________________________________   PHYSICAL EXAM:  Constitutional: Alert and oriented. Well appearing and in no distress. Oriented 4. Eyes: Normal exam ENT   Head: Normocephalic and atraumatic   Mouth/Throat: Mucous membranes are moist. Cardiovascular: Normal rate, regular rhythm. No  murmur Respiratory: Normal respiratory effort without tachypnea nor retractions. Breath sounds are clear Gastrointestinal: Soft and nontender. No distention.   Musculoskeletal: Nontender with normal range of motion in all extremities. Neurologic:  Patient has left upper extremity contracture as well as left facial droop which is baseline per patient. Good strength in right upper extremity, no right facial deficit. Skin:  Skin is warm, dry and intact.  Psychiatric: Mood and affect are normal. Speech and behavior are normal.   ____________________________________________    EKG  EKG reviewed and interpreted by myself shows normal sinus rhythm at 91 bpm, narrow QRS, normal axis, largely normal intervals. Nonspecific ST changes. No ST elevations.  ____________________________________________    RADIOLOGY  CT head is negative for acute abnormality.  ____________________________________________   INITIAL IMPRESSION / ASSESSMENT AND PLAN / ED COURSE  Pertinent labs & imaging results  that were available during my care of the patient were reviewed by me and considered in my medical decision making (see chart for details).  The patient presents the emergency department with difficulty remembering people's names. No other deficits per patient. She has chronic left upper extremity weakness and contracture, with left facial droop. No apparent new deficits. We will check labs including urinalysis, obtain a head CT and closely monitor. Overall the patient appears well, no distress. Nontoxic.  Labs are largely within normal limits. Slight leukocytosis of 12,000, urinalysis is normal. Troponin is negative. CT head shows no acute abnormality. Patient appears well in the emergency department. States she feels well. We will discharge the patient home with PCP follow-up tomorrow.  ____________________________________________   FINAL CLINICAL IMPRESSION(S) / ED DIAGNOSES  Memory deficit    Minna Antis, MD 05/09/16 2009

## 2016-05-09 NOTE — ED Notes (Signed)
Attempted to call [facility]. No answer at this time. Charge nurse aware.

## 2016-05-09 NOTE — ED Notes (Signed)
Patients blue crocs were left behind. Patients mother called and notified. Mother states she will pick them up tomorrow.

## 2016-05-09 NOTE — ED Notes (Signed)
Patient transported to CT 

## 2016-05-09 NOTE — ED Notes (Signed)
In and out cath completed by this RN assisted by [place], NT. Patient tolerated well.

## 2016-05-09 NOTE — ED Notes (Signed)
Assisted the pt with talking on the phone to her mother  Zuleima Chatfield [phone]  Mother requests a call back prior to transporting pt back to [facility]

## 2016-05-23 IMAGING — MR MR HEAD W/O CM
10 series · 48 of 48 positions shown · non-contrast
Comparison: Head CT earlier today

CLINICAL DATA: Left leg numbness beginning today.

EXAM:
MRI HEAD WITHOUT CONTRAST
TECHNIQUE: Multiplanar, multiecho pulse sequences of the brain and surrounding
structures were obtained without intravenous contrast.

[Series 2: T1 · sagittal · 5.0mm · 0.45mm/px · 3 of 29 slices shown (1 of 2)]
[im 1/29]
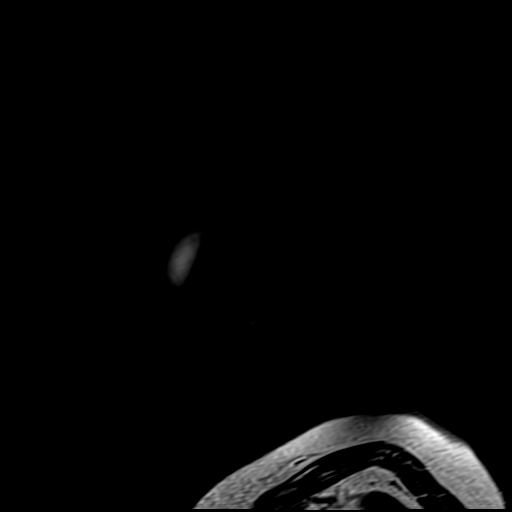
[im 15/29]
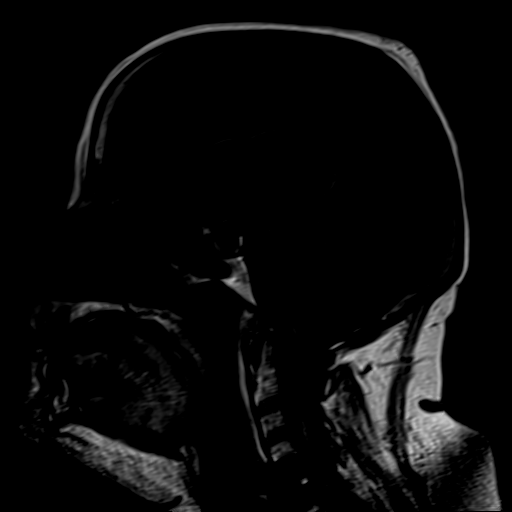
[im 29/29]
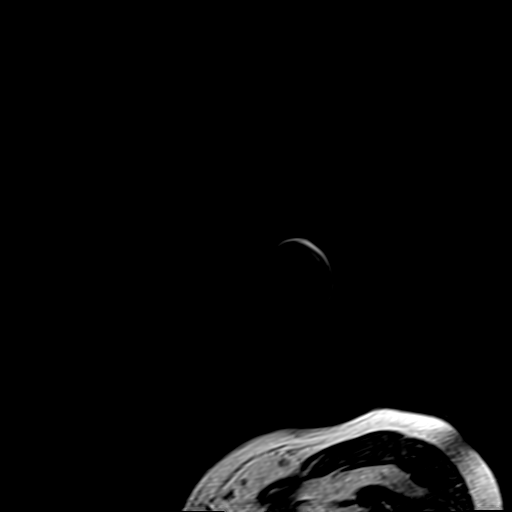

[Series 4: DWI · axial · 3.0mm · 1.80mm/px · z∈[-54,+106]mm · 7 of 55 slices shown (1 of 4)]
[im 1/55]
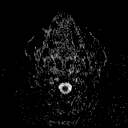
[im 10/55]
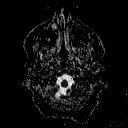
[im 19/55]
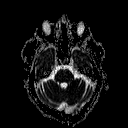
[im 28/55]
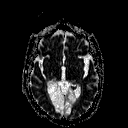
[im 37/55]
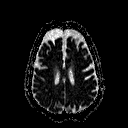
[im 46/55]
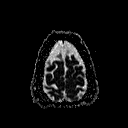
[im 55/55]
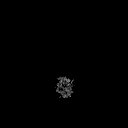

[Series 6: DWI · coronal · 3.0mm · 1.80mm/px · 6 of 49 slices shown (2 of 4)]
[im 1/49]
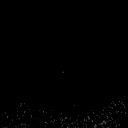
[im 10/49]
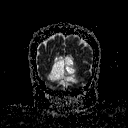
[im 20/49]
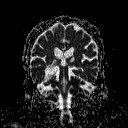
[im 29/49]
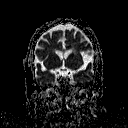
[im 39/49]
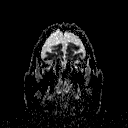
[im 49/49]
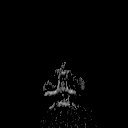

[Series 7: T2 · axial · 5.0mm · 0.60mm/px · z∈[-51,+103]mm · 3 of 25 slices shown (1 of 3)]
[im 1/25]
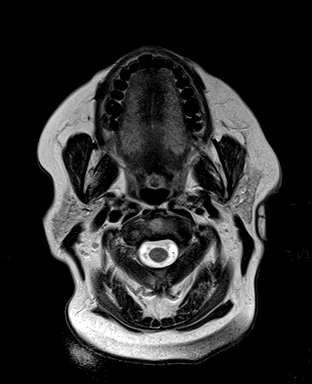
[im 13/25]
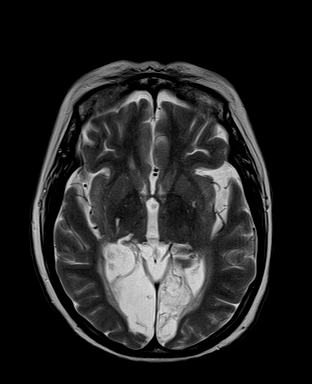
[im 25/25]
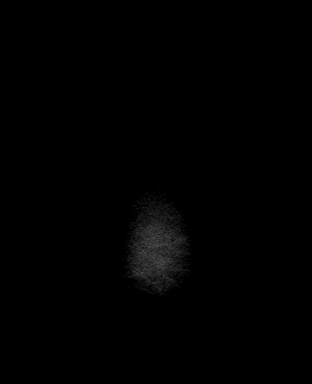

[Series 8: FLAIR · axial · 5.0mm · 0.45mm/px · z∈[-51,+103]mm · 3 of 25 slices shown]
[im 1/25]
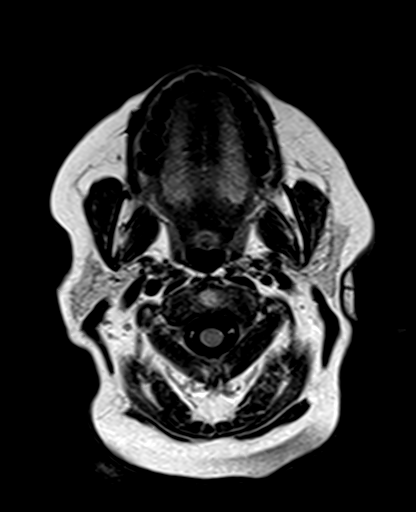
[im 13/25]
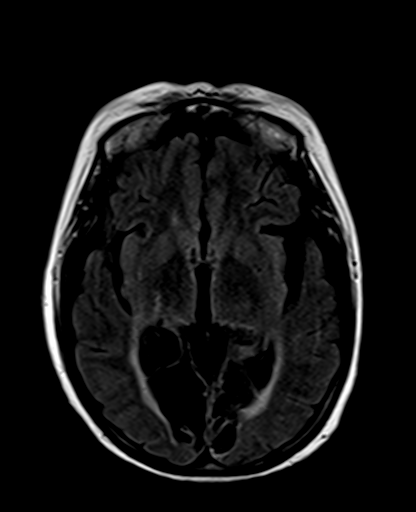
[im 25/25]
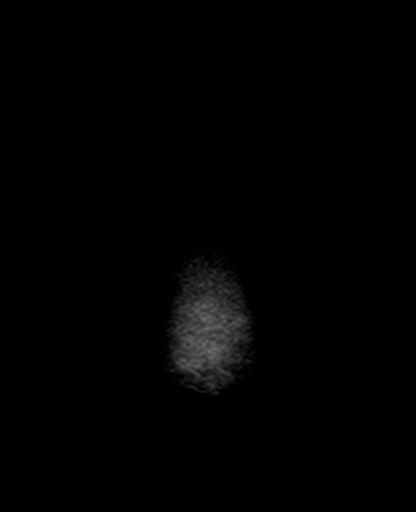

[Series 9: T2 · axial · 5.0mm · 0.45mm/px · z∈[-51,+103]mm · 3 of 25 slices shown (2 of 3)]
[im 1/25]
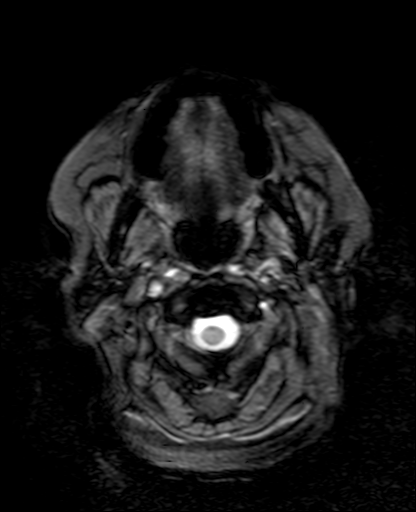
[im 13/25]
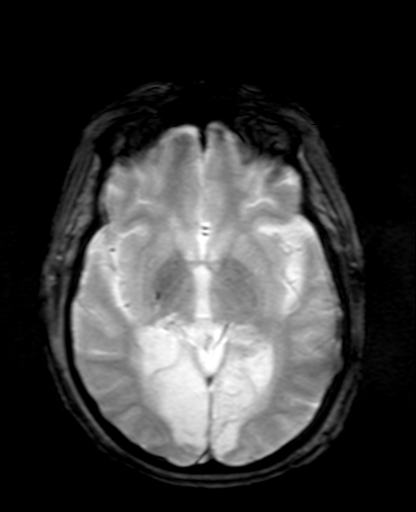
[im 25/25]
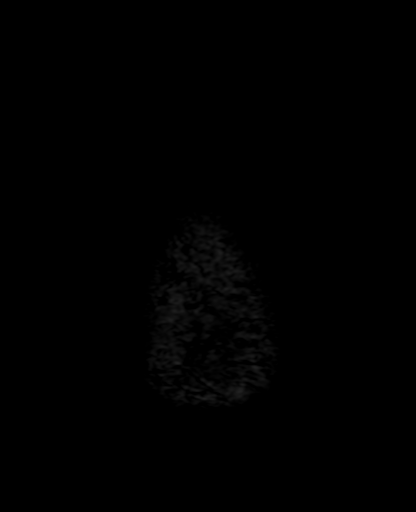

[Series 10: T1 · axial · 3.0mm · 1.00mm/px · z∈[-59,+116]mm · 7 of 60 slices shown (2 of 2)]
[im 1/60]
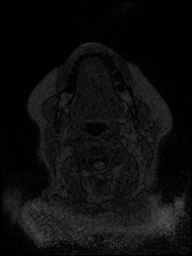
[im 10/60]
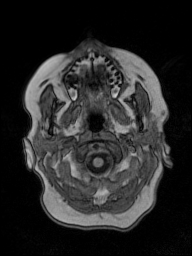
[im 20/60]
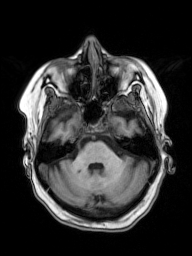
[im 30/60]
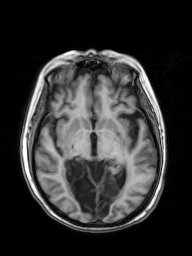
[im 40/60]
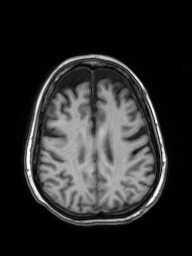
[im 50/60]
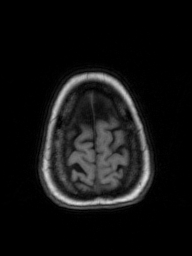
[im 60/60]
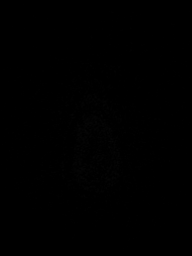

[Series 11: T2 · coronal · 5.0mm · 0.49mm/px · 3 of 29 slices shown (3 of 3)]
[im 1/29]
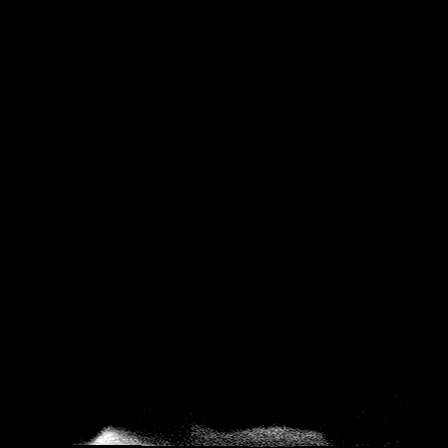
[im 15/29]
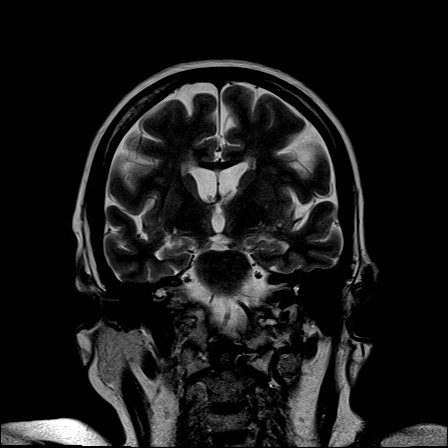
[im 29/29]
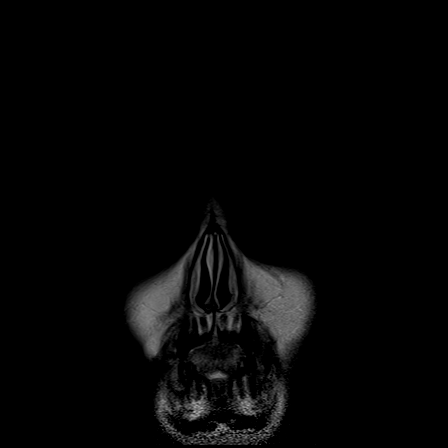

[Series 100: DWI · axial · 3.0mm · 1.80mm/px · z∈[-54,+106]mm · 7 of 55 slices shown (3 of 4)]
[im 1/55]
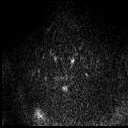
[im 10/55]
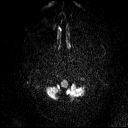
[im 19/55]
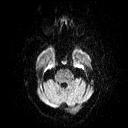
[im 28/55]
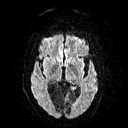
[im 37/55]
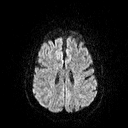
[im 46/55]
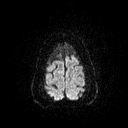
[im 55/55]
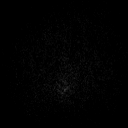

[Series 101: DWI · coronal · 3.0mm · 1.80mm/px · 6 of 49 slices shown (4 of 4)]
[im 1/49]
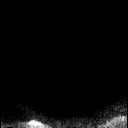
[im 10/49]
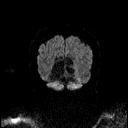
[im 20/49]
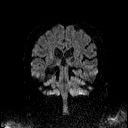
[im 29/49]
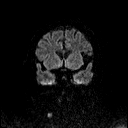
[im 39/49]
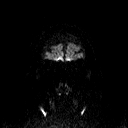
[im 49/49]
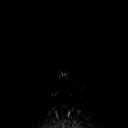

[48 of 48 positions shown; findings below may reference images not displayed]

FINDINGS: There is no evidence of acute infarct, intracranial hemorrhage,
mass, or midline shift. Chronic bilateral PCA territory infarcts are
again noted involving the occipital lobes, posterior temporal lobes,
and thalami. There is ex vacuo dilatation of the right greater than
left lateral ventricles. Chronic bilateral basal ganglia lacunar
infarcts are also present.

Prominent CSF spaces overlying the anterior frontal lobes are
chronic and may reflect atrophy rather than subdural hygromas.
Patchy cerebral white matter T2 hyperintensities are greatest in the
periventricular white matter and nonspecific but compatible with
mild-to-moderate chronic small vessel ischemic disease. Small
chronic infarcts are noted in the right cerebellum.

Orbits are unremarkable. No significant inflammatory changes are
seen in the paranasal sinuses or mastoid air cells. Abnormal
appearance of the distal right vertebral artery may reflect chronic
occlusion. Other major intracranial vascular flow voids are
preserved.
IMPRESSION: 1. No acute intracranial abnormality.
2. Chronic bilateral PCA infarcts.
3. Chronic bilateral basal ganglia and right cerebellar infarcts.
4. Suspected chronic occlusion of the distal right vertebral artery.

## 2016-05-28 ENCOUNTER — Emergency Department: Payer: Medicare Other

## 2016-05-28 ENCOUNTER — Emergency Department
Admission: EM | Admit: 2016-05-28 | Discharge: 2016-05-28 | Disposition: A | Discharge status: Home / Self Care | Payer: Medicare Other | Attending: Emergency Medicine | Admitting: Emergency Medicine

## 2016-05-28 ENCOUNTER — Encounter: Payer: Self-pay | Admitting: Intensive Care

## 2016-05-28 DIAGNOSIS — M62838 Other muscle spasm: Secondary | ICD-10-CM

## 2016-05-28 DIAGNOSIS — R200 Anesthesia of skin: Secondary | ICD-10-CM

## 2016-05-28 DIAGNOSIS — Z7984 Long term (current) use of oral hypoglycemic drugs: Secondary | ICD-10-CM | POA: Diagnosis not present

## 2016-05-28 DIAGNOSIS — I10 Essential (primary) hypertension: Secondary | ICD-10-CM | POA: Diagnosis not present

## 2016-05-28 DIAGNOSIS — E119 Type 2 diabetes mellitus without complications: Secondary | ICD-10-CM | POA: Diagnosis not present

## 2016-05-28 DIAGNOSIS — Z79899 Other long term (current) drug therapy: Secondary | ICD-10-CM | POA: Insufficient documentation

## 2016-05-28 LAB — COMPREHENSIVE METABOLIC PANEL
ALBUMIN: 3.7 g/dL (ref 3.5–5.0)
ALT: 8 U/L — ABNORMAL LOW (ref 14–54)
ANION GAP: 7 (ref 5–15)
AST: 14 U/L — ABNORMAL LOW (ref 15–41)
Alkaline Phosphatase: 80 U/L (ref 38–126)
BILIRUBIN TOTAL: 0.4 mg/dL (ref 0.3–1.2)
BUN: 14 mg/dL (ref 6–20)
CO2: 30 mmol/L (ref 22–32)
Calcium: 8.7 mg/dL — ABNORMAL LOW (ref 8.9–10.3)
Chloride: 102 mmol/L (ref 101–111)
Creatinine, Ser: 0.56 mg/dL (ref 0.44–1.00)
GFR calc Af Amer: >60 mL/min (ref >60)
GFR calc non Af Amer: >60 mL/min (ref >60)
GLUCOSE: 159 mg/dL — AB (ref 65–99)
Potassium: 3.5 mmol/L (ref 3.5–5.1)
SODIUM: 139 mmol/L (ref 135–145)
TOTAL PROTEIN: 6.7 g/dL (ref 6.5–8.1)

## 2016-05-28 LAB — TROPONIN I: Troponin I: <0.03 ng/mL (ref <0.03)

## 2016-05-28 LAB — URINALYSIS COMPLETE WITH MICROSCOPIC (ARMC ONLY)
BACTERIA UA: NONE SEEN
Bilirubin Urine: NEGATIVE
Glucose, UA: NEGATIVE mg/dL
HGB URINE DIPSTICK: NEGATIVE
Ketones, ur: NEGATIVE mg/dL
LEUKOCYTES UA: NEGATIVE
NITRITE: NEGATIVE
PH: 7 (ref 5.0–8.0)
PROTEIN: NEGATIVE mg/dL
Specific Gravity, Urine: 1.006 (ref 1.005–1.030)

## 2016-05-28 LAB — CBC WITH DIFFERENTIAL/PLATELET
BASOS PCT: 0 %
Basophils Absolute: 0 10*3/uL (ref 0–0.1)
Eosinophils Absolute: 0 10*3/uL (ref 0–0.7)
Eosinophils Relative: 0 %
HEMATOCRIT: 37.7 % (ref 35.0–47.0)
HEMOGLOBIN: 12.7 g/dL (ref 12.0–16.0)
Lymphocytes Relative: 16 %
Lymphs Abs: 1.9 10*3/uL (ref 1.0–3.6)
MCH: 27.8 pg (ref 26.0–34.0)
MCHC: 33.6 g/dL (ref 32.0–36.0)
MCV: 82.8 fL (ref 80.0–100.0)
MONOS PCT: 9 %
Monocytes Absolute: 1 10*3/uL — ABNORMAL HIGH (ref 0.2–0.9)
NEUTROS ABS: 9 10*3/uL — AB (ref 1.4–6.5)
NEUTROS PCT: 75 %
Platelets: 321 10*3/uL (ref 150–440)
RBC: 4.55 MIL/uL (ref 3.80–5.20)
RDW: 13.7 % (ref 11.5–14.5)
WBC: 12 10*3/uL — ABNORMAL HIGH (ref 3.6–11.0)

## 2016-05-28 MED ORDER — BACLOFEN 10 MG PO TABS: 10.0000 mg | ORAL_TABLET | Freq: Two times a day (BID) | ORAL | 0 refills | Status: AC

## 2016-05-28 MED ORDER — TIZANIDINE HCL 2 MG PO TABS
2.0000 mg | ORAL_TABLET | Freq: Once | ORAL | Status: DC
  Filled 2016-05-28: qty 1

## 2016-05-28 MED ORDER — DIAZEPAM 5 MG PO TABS
5.0000 mg | ORAL_TABLET | Freq: Once | ORAL | Status: AC
Start: 2016-05-28 — End: 2016-05-28
  Administered 2016-05-28: 5 mg via ORAL

## 2016-05-28 MED ORDER — DIAZEPAM 5 MG PO TABS
ORAL_TABLET | ORAL | Status: AC
  Filled 2016-05-28: qty 1

## 2016-05-28 NOTE — ED Notes (Signed)
Attempted to call MRI tech, left voicemail.

## 2016-05-28 NOTE — ED Notes (Signed)
Pt returned from CT via stretcher.

## 2016-05-28 NOTE — ED Notes (Signed)
Pt had BM, pt was cleaned by [facility], Rn.   In and out completed to collect urine sample. Pt cleaned prior to in and out. Pt tolerated well.

## 2016-05-28 NOTE — ED Notes (Signed)
MRI tech [place] on the phone screening pt before MRI.

## 2016-05-28 NOTE — ED Notes (Signed)
Pt called out stating she needed to use restroom. Pt placed on bedpan, stated she couldn't go. Stated she wanted to sit on bedpan for a few minutes in attempt to go.

## 2016-05-28 NOTE — ED Notes (Signed)
Pt returned from MRI with Judeth Cornfield EDT in stretcher.

## 2016-05-28 NOTE — ED Notes (Addendum)
Pt clothes removed and gown placed on pt. Pt has white sweatshirt, gray sweat pants. Pt jewelry removed and placed in specimen cup- 1 ring and 1 gold necklace. Pt clothes and jewelry placed in belongings bag at bedside. Pt white fuzzy socks left on feet.

## 2016-05-28 NOTE — ED Notes (Addendum)
Pt reporting L hand pain, stated "its bent". Tried to straighten hand out, fingers went back to being bent. Dr. Lenard Lance made aware. Pt hand has been bent the whole time while in ED, as well as wrist and arm on L side. Pt is able to lift arm but not straighten it.

## 2016-05-28 NOTE — ED Triage Notes (Signed)
Per EMS pt from [facility] and has a heavy feeling in her L leg. Pt denies feeling different started 1 hr ago, numbness in L side of face is more severe than normal. Per EMS h/s of diabetes, and pt is blind in both eyes.

## 2016-05-28 NOTE — ED Notes (Signed)
Pt unable to sign for D/C at this time. Pt transported back to [facility] by EMS.

## 2016-05-28 NOTE — ED Notes (Signed)
Pt stated she was done using bedpan. Chuck replaced under pt. Pt cleaned front and back. New chuck placed under pt and brief back on. Pt verbalized comfort.

## 2016-05-28 NOTE — ED Notes (Signed)
Got pt dressed, placed white sweat shirt and gray sweat pants back on pt. Placed silver and red ring on R middle finger and gold necklace around neck. Pt still wearing white fuzzy socks.

## 2016-05-28 NOTE — ED Notes (Signed)
Pt transported to MRI with Judeth Cornfield EDT

## 2016-05-28 NOTE — ED Provider Notes (Signed)
-----------------------------------------   8:23 PM on 05/28/2016 -----------------------------------------  Patient care assumed from Dr. Shaune Pollack. MRI is finally resulted showing extensive chronic infarction, but no acute intracranial abnormality. Patient continues to state some right upper extremity pain which she describes as more of a muscle cramping/spasm. Treated with oral Valium in the emergency department with some relief. I discussed the patient with Dr. Charlyne Quale, who recommends discharging with a short course of baclofen, but stop believe the patient needs to be admitted to the hospital. Patient is agreeable to this plan.   Minna Antis, MD 05/28/16 2024

## 2016-05-28 NOTE — ED Provider Notes (Signed)
[facility] Emergency Department Provider Note ____________________________________________   I have reviewed the triage vital signs and the triage nursing note.  HISTORY  Chief Complaint Weakness   Historian Patient seems to be a bit of a poor historian Additional history obtained from care provider at [facility]  HPI Victoria Medina is a 55 y.o. 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 I asked her several times and several times she stated that it was similar to prior numbness, but then I asked her if there is anything different today and she said that her leg was numb, and states it was right before she told the staff member.  I asked the staff member who states that the patient told her that her leg was numb and that it felt like a prior stroke, and so that staff member sent her in for evaluation. There is no additional concern of any new weakness, any change in mental status.  Onset it sounds like would be 1 PM.    Past Medical History:  Diagnosis Date  . Blind   . Diabetes mellitus without complication (HCC)   . Hyperlipemia   . Hypertension   . Stroke [facility])     There are no active problems to display for this patient.   Past Surgical History:  Procedure Laterality Date  . ABDOMINAL HYSTERECTOMY    . BACK SURGERY    . FRACTURE SURGERY    . SKIN GRAFT Right     Prior to Admission medications   Medication Sig Start Date End Date Taking? Authorizing Provider  atorvastatin (LIPITOR) 20 MG tablet Take 20 mg by mouth every evening.    Yes Historical Provider, MD  busPIRone (BUSPAR) 10 MG tablet Take 10 mg by mouth 2 (two) times daily.   Yes Historical Provider, MD  clonazePAM (KLONOPIN) 0.5 MG  tablet Take 0.5 mg by mouth every 8 (eight) hours as needed for anxiety. *limit 3 doses per 24 hour*   Yes Historical Provider, MD  clopidogrel (PLAVIX) 75 MG tablet Take 75 mg by mouth daily.   Yes Historical Provider, MD  dexlansoprazole (DEXILANT) 60 MG capsule Take 60 mg by mouth daily.   Yes Historical Provider, MD  ezetimibe (ZETIA) 10 MG tablet Take 10 mg by mouth daily.   Yes Historical Provider, MD  fluticasone (FLONASE) 50 MCG/ACT nasal spray Place 2 sprays into both nostrils 2 (two) times daily.    Yes Historical Provider, MD  gabapentin (NEURONTIN) 300 MG capsule Take 300 mg by mouth daily.    Yes Historical Provider, MD  guaiFENesin-dextromethorphan (ROBITUSSIN DM) 100-10 MG/5ML syrup Take 15 mLs by mouth 2 (two) times daily as needed for cough.   Yes Historical Provider, MD  levothyroxine (SYNTHROID, LEVOTHROID) 25 MCG tablet Take 25 mcg by mouth daily before breakfast.   Yes Historical Provider, MD  metFORMIN (GLUCOPHAGE) 500 MG tablet Take 500 mg by mouth 2 (two) times daily.   Yes Historical Provider, MD  metoprolol (LOPRESSOR) 100 MG tablet Take 100 mg by mouth 2 (two) times daily.   Yes Historical Provider, MD  mirtazapine (REMERON) 15 MG tablet Take 22.5 mg by mouth at bedtime.   Yes Historical Provider, MD  nitroGLYCERIN (NITROSTAT) 0.4 MG SL tablet Place 0.4 mg  under the tongue every 5 (five) minutes x 3 doses as needed for chest pain.    Yes Historical Provider, MD  nystatin (MYCOSTATIN) powder Apply 1 application topically 3 (three) times daily as needed (for rash in groin or on breasts).    Yes Historical Provider, MD  ondansetron (ZOFRAN) 4 MG tablet Take 4 mg by mouth 3 (three) times daily as needed for nausea or vomiting.    Yes Historical Provider, MD  POLYETHYLENE GLYCOL 3350 PO Take 17 g by mouth daily. *Mix in 4-6 oz of fluid*   Yes Historical Provider, MD  pseudoephedrine (SUDAFED) 30 MG tablet Take 30 mg by mouth every 6 (six) hours as needed for congestion.    Yes  Historical Provider, MD  risperiDONE (RISPERDAL) 2 MG tablet Take 2 mg by mouth at bedtime.   Yes Historical Provider, MD  tamsulosin (FLOMAX) 0.4 MG CAPS capsule Take 0.4 mg by mouth daily.   Yes Historical Provider, MD  tiZANidine (ZANAFLEX) 2 MG tablet Take 2 mg by mouth 3 (three) times daily as needed for muscle spasms.   Yes Historical Provider, MD  vitamin B-12 (CYANOCOBALAMIN) 1000 MCG tablet Take 1,000 mcg by mouth daily.   Yes Historical Provider, MD  Vitamin D, Ergocalciferol, (DRISDOL) 50000 units CAPS capsule Take 50,000 Units by mouth every 7 (seven) days.   Yes Historical Provider, MD  acetaminophen (TYLENOL) 325 MG tablet Take 650 mg by mouth every 6 (six) hours as needed for mild pain, fever or headache.     Historical Provider, MD  mupirocin ointment (BACTROBAN) 2 % Apply to affected area 3 times daily 07/21/15 07/20/16  Emily FilbertJonathan E Williams, MD  Propylene Glycol (SYSTANE BALANCE) 0.6 % SOLN Apply 1 drop to eye 4 (four) times daily as needed (for dry eyes).     Historical Provider, MD  senna-docusate (SENOKOT S) 8.6-50 MG tablet Take 1 tablet by mouth 2 (two) times daily as needed for mild constipation or moderate constipation.    Historical Provider, MD    Allergies  Allergen Reactions  . Levaquin [Levofloxacin In D5w] Anaphylaxis and Swelling  . Iodinated Diagnostic Agents Itching    Severe itching  . Latex Dermatitis    No family history on file.  Social History Social History  Substance Use Topics  . Smoking status: Never Smoker  . Smokeless tobacco: Never Used  . Alcohol use No    Review of Systems  Constitutional: Negative for fever or recent illness. Eyes: She has been blind since 2014 after a stroke, denies any new changes with the vision. ENT: Negative for sore throat. Cardiovascular: Negative for chest pain. Respiratory: Negative for shortness of breath. Gastrointestinal: Negative for abdominal pain, vomiting and diarrhea. Genitourinary: Negative for  dysuria. Musculoskeletal: Negative for back pain. Skin: Negative for rash. Neurological: Negative for headache. 10 point Review of Systems otherwise negative ____________________________________________   PHYSICAL EXAM:  VITAL SIGNS: ED Triage Vitals  Enc Vitals Group     BP 05/28/16 1425 125/86     Pulse Rate 05/28/16 1425 88     Resp 05/28/16 1425 (!) 22     Temp 05/28/16 1425 98.3 F (36.8 C)     Temp Source 05/28/16 1425 Oral     SpO2 05/28/16 1425 98 %     Weight 05/28/16 1422 168 lb (76.2 kg)     Height 05/28/16 1422 5\' 1"  (1.549 m)     Head Circumference --      Peak Flow --  Pain Score 05/28/16 1423 0     Pain Loc --      Pain Edu? --      Excl. in GC? --      Constitutional: Alert and Cooperative. Well appearing and in no distress. HEENT   Head: Normocephalic and atraumatic.      Eyes: Conjunctivae are normal. PERRL.      Ears:         Nose: No congestion/rhinnorhea.   Mouth/Throat: Mucous membranes are moist.   Neck: No stridor. Cardiovascular/Chest: Normal rate, regular rhythm.  No murmurs, rubs, or gallops. Respiratory: Normal respiratory effort without tachypnea nor retractions. Breath sounds are clear and equal bilaterally. No wheezes/rales/rhonchi. Gastrointestinal: Soft. No distention, no guarding, no rebound. Nontender.    Genitourinary/rectal:Deferred Musculoskeletal: Left arm contracture. Neurologic:  No obvious facial droop. No slurred speech. She has occasional stuttering, but seems to be making sense with her answers. She is able to follow commands. Left arm weakness, held in contracture. Seemed to have normal strength in bilateral lower extremities, and right upper extremity. Numbness/tingling in the left upper extremity and left lower extremity. Skin:  Skin is warm, dry and intact. No rash noted. Psychiatric: No agitation.   ____________________________________________  LABS (pertinent positives/negatives)  Labs Reviewed  CBC  WITH DIFFERENTIAL/PLATELET - Abnormal; Notable for the following:       Result Value   WBC 12.0 (*)    Neutro Abs 9.0 (*)    Monocytes Absolute 1.0 (*)    All other components within normal limits  COMPREHENSIVE METABOLIC PANEL  TROPONIN I  URINALYSIS COMPLETEWITH MICROSCOPIC ([facility] ONLY)    ____________________________________________    EKG I, Governor Rooks, MD, the attending physician have personally viewed and interpreted all ECGs.  85 bpm. Normal sinus rhythm. Narrow QRS. Normal axis. Normal ST and T-wave ____________________________________________  RADIOLOGY All Xrays were viewed by me. Imaging interpreted by Radiologist.  CT head without contrast: IMPRESSION: 1. No acute intracranial process to explain the patient's left lower extremity numbness. Chronic bilateral occipital infarcts. White matter changes.   MRI brain without contrast: Pending __________________________________________  PROCEDURES  Procedure(s) performed: None  Critical Care performed: None  ____________________________________________   ED COURSE / ASSESSMENT AND PLAN  Pertinent labs & imaging results that were available during my care of the patient were reviewed by me and considered in my medical decision making (see chart for details).  Ms. Mathenia was sent in by staff member after she stated that she had acute onset of worsening numbness on top of the chronic left lower extremity numbness. When I spoke with her it was very difficult initially to find out if the symptoms were actually new, but ultimately it does seem to me that she is complaining that around 1 PM she had acute change which was worsening of numbness on top of her chronic paresthesia to the left lower extremity.  Out of an abundance of caution for the possibility of acute stroke, I will go ahead and pursue this.  Per staff member, no additional concerning findings of recent illnesses, headache, altered mental status or  fevers.  I spoke with on-call neurologist, Dr. Madalyn Rob, who recommends no TPA in this patient who isn't NIH stroke scale essentially 1, due to paresthesia.In discussion, we will CT head and if negative plan for MRI of the brain. If MRI the brain does not show an acute stroke, may likely just discharged back to her facility as there would be very little added benefit to hospitalization.  CT  head shows no acute findings.  Patient care transferred to oncoming physician Dr. Lenard Lance at shift change, 3:20 PM. MRI of the brain and laboratory studies are all pending. Disposition after results and recheck.   CONSULTATIONS:  Dr. Madalyn Rob, neurologist, by phone, discussed case and formulated plan.   Patient / Family / Caregiver informed of clinical course, medical decision-making process, and agree with plan.    ___________________________________________   FINAL CLINICAL IMPRESSION(S) / ED DIAGNOSES   Final diagnoses:  Left leg numbness              Note: This dictation was prepared with Dragon dictation. Any transcriptional errors that result from this process are unintentional    Governor Rooks, MD 05/28/16 1521

## 2016-11-15 ENCOUNTER — Emergency Department: Payer: Medicare Other

## 2016-11-15 ENCOUNTER — Encounter: Payer: Self-pay | Admitting: Emergency Medicine

## 2016-11-15 ENCOUNTER — Emergency Department
Admission: EM | Admit: 2016-11-15 | Discharge: 2016-11-15 | Disposition: A | Discharge status: Home / Self Care | Payer: Medicare Other | Attending: Emergency Medicine | Admitting: Emergency Medicine

## 2016-11-15 DIAGNOSIS — W06XXXA Fall from bed, initial encounter: Secondary | ICD-10-CM | POA: Insufficient documentation

## 2016-11-15 DIAGNOSIS — Z7984 Long term (current) use of oral hypoglycemic drugs: Secondary | ICD-10-CM | POA: Insufficient documentation

## 2016-11-15 DIAGNOSIS — Y999 Unspecified external cause status: Secondary | ICD-10-CM | POA: Diagnosis not present

## 2016-11-15 DIAGNOSIS — Z79899 Other long term (current) drug therapy: Secondary | ICD-10-CM | POA: Insufficient documentation

## 2016-11-15 DIAGNOSIS — Z23 Encounter for immunization: Secondary | ICD-10-CM | POA: Diagnosis not present

## 2016-11-15 DIAGNOSIS — E119 Type 2 diabetes mellitus without complications: Secondary | ICD-10-CM | POA: Insufficient documentation

## 2016-11-15 DIAGNOSIS — Y929 Unspecified place or not applicable: Secondary | ICD-10-CM | POA: Insufficient documentation

## 2016-11-15 DIAGNOSIS — Y939 Activity, unspecified: Secondary | ICD-10-CM | POA: Diagnosis not present

## 2016-11-15 DIAGNOSIS — I10 Essential (primary) hypertension: Secondary | ICD-10-CM | POA: Diagnosis not present

## 2016-11-15 DIAGNOSIS — S0101XA Laceration without foreign body of scalp, initial encounter: Secondary | ICD-10-CM | POA: Diagnosis not present

## 2016-11-15 DIAGNOSIS — S0990XA Unspecified injury of head, initial encounter: Secondary | ICD-10-CM | POA: Diagnosis present

## 2016-11-15 DIAGNOSIS — W19XXXA Unspecified fall, initial encounter: Secondary | ICD-10-CM

## 2016-11-15 MED ORDER — TETANUS-DIPHTH-ACELL PERTUSSIS 5-2.5-18.5 LF-MCG/0.5 IM SUSP
0.5000 mL | Freq: Once | INTRAMUSCULAR | Status: AC
  Administered 2016-11-15: 0.5 mL via INTRAMUSCULAR
  Filled 2016-11-15: qty 0.5

## 2016-11-15 NOTE — ED Notes (Signed)
Patient transported to CT 

## 2016-11-15 NOTE — ED Provider Notes (Signed)
[facility] Emergency Department Provider Note  ____________________________________________   I have reviewed the triage vital signs and the nursing notes.   HISTORY  Chief Complaint Fall    HPI Victoria Medina is a 56 y.o. female was very unfortunate having had multiple prior strokes, she is on blood thinners. She states that she was having a dream last night and she refers rolling out of bed and bumping her head. Did not lose consciousness. Has no other complaints. Not up-to-date on her tetanus shot. Does not feel she injured anything else. Has a small laceration to the back of her head. No headache. No numbness or weakness of her baseline. She remembers waking up as she rolled out of bed. Her chest pain or shortness of breath no prodrome or subsequent syncopal symptoms. She feels completely normal at this time.       Past Medical History:  Diagnosis Date  . Blind   . Diabetes mellitus without complication (HCC)   . Hyperlipemia   . Hypertension   . Stroke [facility])     There are no active problems to display for this patient.   Past Surgical History:  Procedure Laterality Date  . ABDOMINAL HYSTERECTOMY    . BACK SURGERY    . FRACTURE SURGERY    . SKIN GRAFT Right     Prior to Admission medications   Medication Sig Start Date End Date Taking? Authorizing Provider  atorvastatin (LIPITOR) 20 MG tablet Take 20 mg by mouth every evening.    Yes Historical Provider, MD  busPIRone (BUSPAR) 10 MG tablet Take 10 mg by mouth 2 (two) times daily.   Yes Historical Provider, MD  clonazePAM (KLONOPIN) 0.5 MG tablet Take 0.5 mg by mouth every 8 (eight) hours as needed for anxiety. *limit 3 doses per 24 hour*   Yes Historical Provider, MD  fluticasone (FLONASE) 50 MCG/ACT nasal spray Place 2 sprays into both nostrils 2 (two) times daily.    Yes Historical Provider, MD  metFORMIN (GLUCOPHAGE) 500 MG tablet Take 500 mg by mouth 2 (two) times daily.   Yes Historical  Provider, MD  metoprolol (LOPRESSOR) 100 MG tablet Take 100 mg by mouth 2 (two) times daily.   Yes Historical Provider, MD  mirtazapine (REMERON) 15 MG tablet Take 22.5 mg by mouth at bedtime.   Yes Historical Provider, MD  nitroGLYCERIN (NITROSTAT) 0.4 MG SL tablet Place 0.4 mg under the tongue every 5 (five) minutes x 3 doses as needed for chest pain.    Yes Historical Provider, MD  pseudoephedrine (SUDAFED) 30 MG tablet Take 30 mg by mouth every 6 (six) hours as needed for congestion.    Yes Historical Provider, MD  risperiDONE (RISPERDAL) 2 MG tablet Take 2 mg by mouth at bedtime.   Yes Historical Provider, MD  tiZANidine (ZANAFLEX) 2 MG tablet Take 2 mg by mouth 3 (three) times daily as needed for muscle spasms.   Yes Historical Provider, MD  Vitamin D, Ergocalciferol, (DRISDOL) 50000 units CAPS capsule Take 50,000 Units by mouth every 7 (seven) days.   Yes Historical Provider, MD    Allergies Levaquin [levofloxacin in d5w]; Iodinated diagnostic agents; and Latex  No family history on file.  Social History Social History  Substance Use Topics  . Smoking status: Never Smoker  . Smokeless tobacco: Never Used  . Alcohol use No    Review of Systems Constitutional: No fever/chills Eyes: No visual changes. ENT: No sore throat. No stiff neck no neck pain Cardiovascular:  Denies chest pain. Respiratory: Denies shortness of breath. Gastrointestinal:   no vomiting.  No diarrhea.  No constipation. Genitourinary: Negative for dysuria. Musculoskeletal: Negative lower extremity swelling Skin: Negative for rash. Neurological: Negative for severe headaches, focal weakness or numbness. 10-point ROS otherwise negative.  ____________________________________________   PHYSICAL EXAM:  VITAL SIGNS: ED Triage Vitals  Enc Vitals Group     BP 11/15/16 0800 106/76     Pulse Rate 11/15/16 0800 (!) 51     Resp 11/15/16 0800 16     Temp --      Temp src --      SpO2 11/15/16 0800 98 %      Weight 11/15/16 0640 173 lb 4.5 oz (78.6 kg)     Height 11/15/16 0640 5' 1.5" (1.562 m)     Head Circumference --      Peak Flow --      Pain Score 11/15/16 0639 3     Pain Loc --      Pain Edu? --      Excl. in GC? --     Constitutional: Alert and oriented. Well appearing and in no acute distress. Eyes: No obvious injury Head: Small laceration, approximate 27 m noted to the occiput no active bleeding and no skull fracture. Nose: No congestion/rhinnorhea. Mouth/Throat: Mucous membranes are moist.  Oropharynx non-erythematous. Neck: No stridor.   Nontender with no meningismus Cardiovascular: Normal rate, regular rhythm. Grossly normal heart sounds.  Good peripheral circulation. Respiratory: Normal respiratory effort.  No retractions. Lungs CTAB. Abdominal: Soft and nontender. No distention. No guarding no rebound Back:  There is no focal tenderness or step off.  there is no midline tenderness there are no lesions noted. there is no CVA tenderness Musculoskeletal: No lower extremity tenderness, no upper extremity tenderness. No joint effusions, no DVT signs strong distal pulses no edema Neurologic:  Patient with left-sided contractures from prior CVA, Skin:  Skin is warm, dry and intact. No rash noted. Psychiatric: Mood and affect are normal. Speech and behavior are normal.  ____________________________________________   LABS (all labs ordered are listed, but only abnormal results are displayed)  Labs Reviewed - No data to display ____________________________________________  EKG  I personally interpreted any EKGs ordered by me or triage Sinus rate 60 bpm, baseline tremor noted, st elevation or depression, nonspecific ST changes, ____________________________________________  RADIOLOGY  I reviewed any imaging ordered by me or triage that were performed during my shift and, if possible, patient and/or family made aware of any abnormal  findings. ____________________________________________   PROCEDURES  Procedure(s) performed: LACERATION REPAIR Performed by: Jeanmarie Plant Authorized by: Jeanmarie Plant Consent: Verbal consent obtained. Risks and benefits: risks, benefits and alternatives were discussed Consent given by: patient Patient identity confirmed: provided demographic data Prepped and Draped in normal sterile fashion Wound explored  Laceration Location: Occiput  Laceration Length: 1.7 cm  No Foreign Bodies seen or palpated  Anesthesia: local infiltration  Local anesthetic: None at patient request   Anesthetic total: 0 ml  Irrigation method: syringe Amount of cleaning: standard  Skin closure: Staples   Number of sutures: 2   Technique: Staples   Patient tolerance: Patient tolerated the procedure well with no immediate complications.   Procedures  Critical Care performed: None  ____________________________________________   INITIAL IMPRESSION / ASSESSMENT AND PLAN / ED COURSE  Pertinent labs & imaging results that were available during my care of the patient were reviewed by me and considered in my medical decision making (  see chart for details).  Administration with a non-syncopal fall, rolled out of bed, has small laceration to the occiput. As she is less tenderness I did do a CT scan which trace is no evidence of significant injury. Patient is in no acute distress, at her baseline. Nothing to suggest syncope. I did offer her blood work which she would prefer to defer and there is no evidence that it is going to change management at this time. In addition, patient will be given a tetanus shot. I did repair the laceration using staples. I offered to use lidocaine but she says "just staple it is fine". She preferred not to have lidocaine as she feels that is more painful      ____________________________________________   FINAL CLINICAL IMPRESSION(S) / ED DIAGNOSES  Final  diagnoses:  Fall, initial encounter  Laceration of scalp, initial encounter      This chart was dictated using voice recognition software.  Despite best efforts to proofread,  errors can occur which can change meaning.      Jeanmarie Plant, MD 11/15/16 [phone]

## 2016-11-15 NOTE — ED Notes (Signed)
Left with [facility] back to [facility]. VSS on leaving. Pt alert and oriented.

## 2016-11-15 NOTE — ED Triage Notes (Addendum)
Patient comes in via [facility] from [facility]. Patient had a unwitnessed fall around 0300. Patient was asleep. Per EMS per the facility their guess is she rolled out of bed and hit her head on the nightstand. Per EMS patient has about a 1in laceration to the back of head. No bleeding at this time. Per patient did not black out

## 2016-11-15 NOTE — ED Notes (Signed)
Report called to diamond at [facility]. Verbalized understanding of report.

## 2017-08-21 DIAGNOSIS — I25110 Atherosclerotic heart disease of native coronary artery with unstable angina pectoris: Secondary | ICD-10-CM

## 2017-08-21 DIAGNOSIS — G8194 Hemiplegia, unspecified affecting left nondominant side: Secondary | ICD-10-CM

## 2017-08-21 DIAGNOSIS — K219 Gastro-esophageal reflux disease without esophagitis: Secondary | ICD-10-CM | POA: Diagnosis not present

## 2017-08-21 DIAGNOSIS — F39 Unspecified mood [affective] disorder: Secondary | ICD-10-CM | POA: Diagnosis not present

## 2017-08-21 DIAGNOSIS — I10 Essential (primary) hypertension: Secondary | ICD-10-CM

## 2017-08-21 DIAGNOSIS — E119 Type 2 diabetes mellitus without complications: Secondary | ICD-10-CM

## 2017-08-28 DIAGNOSIS — L259 Unspecified contact dermatitis, unspecified cause: Secondary | ICD-10-CM

## 2017-09-20 DIAGNOSIS — E119 Type 2 diabetes mellitus without complications: Secondary | ICD-10-CM

## 2017-09-20 DIAGNOSIS — I10 Essential (primary) hypertension: Secondary | ICD-10-CM

## 2017-09-20 DIAGNOSIS — G8194 Hemiplegia, unspecified affecting left nondominant side: Secondary | ICD-10-CM

## 2017-09-20 DIAGNOSIS — F39 Unspecified mood [affective] disorder: Secondary | ICD-10-CM | POA: Diagnosis not present

## 2017-09-20 DIAGNOSIS — I2510 Atherosclerotic heart disease of native coronary artery without angina pectoris: Secondary | ICD-10-CM

## 2017-10-17 DIAGNOSIS — I2510 Atherosclerotic heart disease of native coronary artery without angina pectoris: Secondary | ICD-10-CM | POA: Diagnosis not present

## 2017-10-17 DIAGNOSIS — E039 Hypothyroidism, unspecified: Secondary | ICD-10-CM

## 2017-10-17 DIAGNOSIS — I69398 Other sequelae of cerebral infarction: Secondary | ICD-10-CM

## 2017-10-17 DIAGNOSIS — F39 Unspecified mood [affective] disorder: Secondary | ICD-10-CM | POA: Diagnosis not present

## 2017-10-17 DIAGNOSIS — I10 Essential (primary) hypertension: Secondary | ICD-10-CM

## 2017-10-17 DIAGNOSIS — E119 Type 2 diabetes mellitus without complications: Secondary | ICD-10-CM | POA: Diagnosis not present

## 2017-10-17 DIAGNOSIS — K219 Gastro-esophageal reflux disease without esophagitis: Secondary | ICD-10-CM

## 2017-11-01 DIAGNOSIS — G8194 Hemiplegia, unspecified affecting left nondominant side: Secondary | ICD-10-CM

## 2017-11-01 DIAGNOSIS — K219 Gastro-esophageal reflux disease without esophagitis: Secondary | ICD-10-CM | POA: Diagnosis not present

## 2017-11-01 DIAGNOSIS — E1159 Type 2 diabetes mellitus with other circulatory complications: Secondary | ICD-10-CM

## 2017-11-01 DIAGNOSIS — I2510 Atherosclerotic heart disease of native coronary artery without angina pectoris: Secondary | ICD-10-CM

## 2017-11-01 DIAGNOSIS — F39 Unspecified mood [affective] disorder: Secondary | ICD-10-CM | POA: Diagnosis not present

## 2017-11-01 DIAGNOSIS — I10 Essential (primary) hypertension: Secondary | ICD-10-CM

## 2018-01-16 DIAGNOSIS — E039 Hypothyroidism, unspecified: Secondary | ICD-10-CM

## 2018-01-16 DIAGNOSIS — I2510 Atherosclerotic heart disease of native coronary artery without angina pectoris: Secondary | ICD-10-CM

## 2018-01-16 DIAGNOSIS — I69398 Other sequelae of cerebral infarction: Secondary | ICD-10-CM

## 2018-01-16 DIAGNOSIS — E1151 Type 2 diabetes mellitus with diabetic peripheral angiopathy without gangrene: Secondary | ICD-10-CM

## 2018-01-16 DIAGNOSIS — K219 Gastro-esophageal reflux disease without esophagitis: Secondary | ICD-10-CM

## 2018-01-16 DIAGNOSIS — F39 Unspecified mood [affective] disorder: Secondary | ICD-10-CM

## 2018-01-16 DIAGNOSIS — I10 Essential (primary) hypertension: Secondary | ICD-10-CM

## 2018-02-04 DIAGNOSIS — G479 Sleep disorder, unspecified: Secondary | ICD-10-CM

## 2018-03-14 DIAGNOSIS — F39 Unspecified mood [affective] disorder: Secondary | ICD-10-CM

## 2018-03-14 DIAGNOSIS — E1159 Type 2 diabetes mellitus with other circulatory complications: Secondary | ICD-10-CM

## 2018-03-14 DIAGNOSIS — I2510 Atherosclerotic heart disease of native coronary artery without angina pectoris: Secondary | ICD-10-CM

## 2018-03-14 DIAGNOSIS — I10 Essential (primary) hypertension: Secondary | ICD-10-CM

## 2018-03-14 DIAGNOSIS — G8194 Hemiplegia, unspecified affecting left nondominant side: Secondary | ICD-10-CM

## 2018-05-24 DIAGNOSIS — I69398 Other sequelae of cerebral infarction: Secondary | ICD-10-CM

## 2018-05-24 DIAGNOSIS — K219 Gastro-esophageal reflux disease without esophagitis: Secondary | ICD-10-CM

## 2018-05-24 DIAGNOSIS — F39 Unspecified mood [affective] disorder: Secondary | ICD-10-CM

## 2018-05-24 DIAGNOSIS — E039 Hypothyroidism, unspecified: Secondary | ICD-10-CM

## 2018-05-24 DIAGNOSIS — E1159 Type 2 diabetes mellitus with other circulatory complications: Secondary | ICD-10-CM

## 2018-05-24 DIAGNOSIS — I10 Essential (primary) hypertension: Secondary | ICD-10-CM

## 2018-05-24 DIAGNOSIS — I2510 Atherosclerotic heart disease of native coronary artery without angina pectoris: Secondary | ICD-10-CM

## 2018-07-08 DIAGNOSIS — F39 Unspecified mood [affective] disorder: Secondary | ICD-10-CM

## 2018-07-08 DIAGNOSIS — E1159 Type 2 diabetes mellitus with other circulatory complications: Secondary | ICD-10-CM

## 2018-07-08 DIAGNOSIS — G8194 Hemiplegia, unspecified affecting left nondominant side: Secondary | ICD-10-CM

## 2018-07-08 DIAGNOSIS — I10 Essential (primary) hypertension: Secondary | ICD-10-CM

## 2018-07-29 DIAGNOSIS — F411 Generalized anxiety disorder: Secondary | ICD-10-CM

## 2018-08-13 DIAGNOSIS — R112 Nausea with vomiting, unspecified: Secondary | ICD-10-CM

## 2018-09-25 DIAGNOSIS — E039 Hypothyroidism, unspecified: Secondary | ICD-10-CM

## 2018-09-25 DIAGNOSIS — I739 Peripheral vascular disease, unspecified: Secondary | ICD-10-CM

## 2018-09-25 DIAGNOSIS — E1151 Type 2 diabetes mellitus with diabetic peripheral angiopathy without gangrene: Secondary | ICD-10-CM

## 2018-09-25 DIAGNOSIS — F39 Unspecified mood [affective] disorder: Secondary | ICD-10-CM

## 2018-09-25 DIAGNOSIS — I69351 Hemiplegia and hemiparesis following cerebral infarction affecting right dominant side: Secondary | ICD-10-CM

## 2018-09-25 DIAGNOSIS — I10 Essential (primary) hypertension: Secondary | ICD-10-CM

## 2018-09-25 DIAGNOSIS — K219 Gastro-esophageal reflux disease without esophagitis: Secondary | ICD-10-CM

## 2018-10-07 DIAGNOSIS — J0140 Acute pansinusitis, unspecified: Secondary | ICD-10-CM

## 2018-11-07 DIAGNOSIS — G8194 Hemiplegia, unspecified affecting left nondominant side: Secondary | ICD-10-CM

## 2018-11-07 DIAGNOSIS — I10 Essential (primary) hypertension: Secondary | ICD-10-CM

## 2018-11-07 DIAGNOSIS — E1159 Type 2 diabetes mellitus with other circulatory complications: Secondary | ICD-10-CM

## 2018-11-07 DIAGNOSIS — F39 Unspecified mood [affective] disorder: Secondary | ICD-10-CM

## 2019-01-15 DIAGNOSIS — F39 Unspecified mood [affective] disorder: Secondary | ICD-10-CM

## 2019-01-15 DIAGNOSIS — E1140 Type 2 diabetes mellitus with diabetic neuropathy, unspecified: Secondary | ICD-10-CM

## 2019-01-15 DIAGNOSIS — I10 Essential (primary) hypertension: Secondary | ICD-10-CM

## 2019-01-15 DIAGNOSIS — I2510 Atherosclerotic heart disease of native coronary artery without angina pectoris: Secondary | ICD-10-CM

## 2019-01-15 DIAGNOSIS — E039 Hypothyroidism, unspecified: Secondary | ICD-10-CM

## 2019-01-15 DIAGNOSIS — I69351 Hemiplegia and hemiparesis following cerebral infarction affecting right dominant side: Secondary | ICD-10-CM

## 2019-01-15 DIAGNOSIS — K219 Gastro-esophageal reflux disease without esophagitis: Secondary | ICD-10-CM

## 2019-01-17 DIAGNOSIS — B351 Tinea unguium: Secondary | ICD-10-CM

## 2019-02-17 DIAGNOSIS — F419 Anxiety disorder, unspecified: Secondary | ICD-10-CM

## 2019-02-17 DIAGNOSIS — K219 Gastro-esophageal reflux disease without esophagitis: Secondary | ICD-10-CM

## 2019-03-13 DIAGNOSIS — F1320 Sedative, hypnotic or anxiolytic dependence, uncomplicated: Secondary | ICD-10-CM

## 2019-03-13 DIAGNOSIS — E1159 Type 2 diabetes mellitus with other circulatory complications: Secondary | ICD-10-CM

## 2019-03-13 DIAGNOSIS — F39 Unspecified mood [affective] disorder: Secondary | ICD-10-CM

## 2019-03-13 DIAGNOSIS — I10 Essential (primary) hypertension: Secondary | ICD-10-CM

## 2019-03-13 DIAGNOSIS — G8194 Hemiplegia, unspecified affecting left nondominant side: Secondary | ICD-10-CM

## 2019-04-21 DIAGNOSIS — R29810 Facial weakness: Secondary | ICD-10-CM

## 2019-05-01 DIAGNOSIS — F329 Major depressive disorder, single episode, unspecified: Secondary | ICD-10-CM

## 2019-05-21 DIAGNOSIS — I10 Essential (primary) hypertension: Secondary | ICD-10-CM

## 2019-05-21 DIAGNOSIS — E1159 Type 2 diabetes mellitus with other circulatory complications: Secondary | ICD-10-CM

## 2019-05-21 DIAGNOSIS — F39 Unspecified mood [affective] disorder: Secondary | ICD-10-CM

## 2019-05-21 DIAGNOSIS — I69359 Hemiplegia and hemiparesis following cerebral infarction affecting unspecified side: Secondary | ICD-10-CM

## 2019-05-21 DIAGNOSIS — H47619 Cortical blindness, unspecified side of brain: Secondary | ICD-10-CM

## 2019-05-26 DIAGNOSIS — F39 Unspecified mood [affective] disorder: Secondary | ICD-10-CM

## 2019-07-15 DIAGNOSIS — G8194 Hemiplegia, unspecified affecting left nondominant side: Secondary | ICD-10-CM

## 2019-07-15 DIAGNOSIS — F39 Unspecified mood [affective] disorder: Secondary | ICD-10-CM

## 2019-07-15 DIAGNOSIS — E1159 Type 2 diabetes mellitus with other circulatory complications: Secondary | ICD-10-CM

## 2019-07-15 DIAGNOSIS — I10 Essential (primary) hypertension: Secondary | ICD-10-CM

## 2019-08-28 DIAGNOSIS — F419 Anxiety disorder, unspecified: Secondary | ICD-10-CM

## 2019-09-12 DIAGNOSIS — I10 Essential (primary) hypertension: Secondary | ICD-10-CM

## 2019-09-12 DIAGNOSIS — F39 Unspecified mood [affective] disorder: Secondary | ICD-10-CM

## 2019-09-12 DIAGNOSIS — F1320 Sedative, hypnotic or anxiolytic dependence, uncomplicated: Secondary | ICD-10-CM

## 2019-09-12 DIAGNOSIS — E039 Hypothyroidism, unspecified: Secondary | ICD-10-CM

## 2019-09-12 DIAGNOSIS — E1159 Type 2 diabetes mellitus with other circulatory complications: Secondary | ICD-10-CM

## 2019-09-12 DIAGNOSIS — I69354 Hemiplegia and hemiparesis following cerebral infarction affecting left non-dominant side: Secondary | ICD-10-CM

## 2019-09-12 DIAGNOSIS — K219 Gastro-esophageal reflux disease without esophagitis: Secondary | ICD-10-CM

## 2019-10-28 DIAGNOSIS — M25561 Pain in right knee: Secondary | ICD-10-CM

## 2019-11-24 DIAGNOSIS — F39 Unspecified mood [affective] disorder: Secondary | ICD-10-CM

## 2019-11-24 DIAGNOSIS — G8194 Hemiplegia, unspecified affecting left nondominant side: Secondary | ICD-10-CM

## 2019-11-24 DIAGNOSIS — E1159 Type 2 diabetes mellitus with other circulatory complications: Secondary | ICD-10-CM

## 2019-11-24 DIAGNOSIS — F1320 Sedative, hypnotic or anxiolytic dependence, uncomplicated: Secondary | ICD-10-CM

## 2019-11-24 DIAGNOSIS — I10 Essential (primary) hypertension: Secondary | ICD-10-CM

## 2020-01-14 DIAGNOSIS — I10 Essential (primary) hypertension: Secondary | ICD-10-CM

## 2020-01-14 DIAGNOSIS — I69359 Hemiplegia and hemiparesis following cerebral infarction affecting unspecified side: Secondary | ICD-10-CM

## 2020-01-14 DIAGNOSIS — F39 Unspecified mood [affective] disorder: Secondary | ICD-10-CM

## 2020-01-14 DIAGNOSIS — E039 Hypothyroidism, unspecified: Secondary | ICD-10-CM

## 2020-01-14 DIAGNOSIS — K219 Gastro-esophageal reflux disease without esophagitis: Secondary | ICD-10-CM

## 2020-01-14 DIAGNOSIS — F1320 Sedative, hypnotic or anxiolytic dependence, uncomplicated: Secondary | ICD-10-CM

## 2020-01-14 DIAGNOSIS — E1151 Type 2 diabetes mellitus with diabetic peripheral angiopathy without gangrene: Secondary | ICD-10-CM

## 2020-01-22 ENCOUNTER — Emergency Department
Admission: EM | Admit: 2020-01-22 | Discharge: 2020-01-22 | Disposition: A | Discharge status: Home / Self Care | Payer: Medicare Other | Source: Home / Self Care | Attending: Emergency Medicine | Admitting: Emergency Medicine

## 2020-01-22 ENCOUNTER — Encounter: Payer: Self-pay | Admitting: *Deleted

## 2020-01-22 ENCOUNTER — Other Ambulatory Visit: Payer: Self-pay

## 2020-01-22 ENCOUNTER — Emergency Department: Payer: Medicare Other

## 2020-01-22 DIAGNOSIS — Z79899 Other long term (current) drug therapy: Secondary | ICD-10-CM | POA: Insufficient documentation

## 2020-01-22 DIAGNOSIS — I10 Essential (primary) hypertension: Secondary | ICD-10-CM | POA: Insufficient documentation

## 2020-01-22 DIAGNOSIS — I2119 ST elevation (STEMI) myocardial infarction involving other coronary artery of inferior wall: Secondary | ICD-10-CM | POA: Diagnosis not present

## 2020-01-22 DIAGNOSIS — Z91040 Latex allergy status: Secondary | ICD-10-CM | POA: Insufficient documentation

## 2020-01-22 DIAGNOSIS — R079 Chest pain, unspecified: Secondary | ICD-10-CM

## 2020-01-22 DIAGNOSIS — I213 ST elevation (STEMI) myocardial infarction of unspecified site: Secondary | ICD-10-CM | POA: Diagnosis not present

## 2020-01-22 DIAGNOSIS — E119 Type 2 diabetes mellitus without complications: Secondary | ICD-10-CM | POA: Insufficient documentation

## 2020-01-22 DIAGNOSIS — Z7984 Long term (current) use of oral hypoglycemic drugs: Secondary | ICD-10-CM | POA: Insufficient documentation

## 2020-01-22 LAB — BASIC METABOLIC PANEL
Anion gap: 15 (ref 5–15)
BUN: 14 mg/dL (ref 6–20)
CO2: 25 mmol/L (ref 22–32)
Calcium: 9.8 mg/dL (ref 8.9–10.3)
Chloride: 98 mmol/L (ref 98–111)
Creatinine, Ser: 0.89 mg/dL (ref 0.44–1.00)
GFR calc Af Amer: >60 mL/min (ref >60)
GFR calc non Af Amer: >60 mL/min (ref >60)
Glucose, Bld: 161 mg/dL — ABNORMAL HIGH (ref 70–99)
Potassium: 4.2 mmol/L (ref 3.5–5.1)
Sodium: 138 mmol/L (ref 135–145)

## 2020-01-22 LAB — CBC
HCT: 38 % (ref 36.0–46.0)
Hemoglobin: 11.6 g/dL — ABNORMAL LOW (ref 12.0–15.0)
MCH: 22.1 pg — ABNORMAL LOW (ref 26.0–34.0)
MCHC: 30.5 g/dL (ref 30.0–36.0)
MCV: 72.5 fL — ABNORMAL LOW (ref 80.0–100.0)
Platelets: 477 10*3/uL — ABNORMAL HIGH (ref 150–400)
RBC: 5.24 MIL/uL — ABNORMAL HIGH (ref 3.87–5.11)
RDW: 16.2 % — ABNORMAL HIGH (ref 11.5–15.5)
WBC: 12.9 10*3/uL — ABNORMAL HIGH (ref 4.0–10.5)
nRBC: 0 % (ref 0.0–0.2)

## 2020-01-22 LAB — HEPATIC FUNCTION PANEL
ALT: 12 U/L (ref 0–44)
AST: 17 U/L (ref 15–41)
Albumin: 4.6 g/dL (ref 3.5–5.0)
Alkaline Phosphatase: 86 U/L (ref 38–126)
Bilirubin, Direct: <0.1 mg/dL (ref 0.0–0.2)
Total Bilirubin: 0.6 mg/dL (ref 0.3–1.2)
Total Protein: 8.7 g/dL — ABNORMAL HIGH (ref 6.5–8.1)

## 2020-01-22 LAB — LIPASE, BLOOD: Lipase: 46 U/L (ref 11–51)

## 2020-01-22 LAB — TROPONIN I (HIGH SENSITIVITY)
Troponin I (High Sensitivity): 16 ng/L (ref <18)
Troponin I (High Sensitivity): 17 ng/L (ref <18)

## 2020-01-22 LAB — BRAIN NATRIURETIC PEPTIDE: B Natriuretic Peptide: 73.1 pg/mL (ref 0.0–100.0)

## 2020-01-22 MED ORDER — DIPHENHYDRAMINE HCL 25 MG PO CAPS
25.0000 mg | ORAL_CAPSULE | Freq: Once | ORAL | Status: AC
  Administered 2020-01-22: 25 mg via ORAL
  Filled 2020-01-22: qty 1

## 2020-01-22 MED ORDER — ACETAMINOPHEN 500 MG PO TABS
1000.0000 mg | ORAL_TABLET | Freq: Once | ORAL | Status: DC
  Filled 2020-01-22: qty 2

## 2020-01-22 MED ORDER — KETOROLAC TROMETHAMINE 30 MG/ML IJ SOLN
15.0000 mg | Freq: Once | INTRAMUSCULAR | Status: AC
  Administered 2020-01-22: 15 mg via INTRAVENOUS
  Filled 2020-01-22: qty 1

## 2020-01-22 MED ORDER — SODIUM CHLORIDE 0.9% FLUSH
3.0000 mL | Freq: Once | INTRAVENOUS | Status: AC
  Administered 2020-01-22: 3 mL via INTRAVENOUS

## 2020-01-22 MED ORDER — MORPHINE SULFATE (PF) 4 MG/ML IV SOLN
4.0000 mg | Freq: Once | INTRAVENOUS | Status: AC
  Administered 2020-01-22: 4 mg via INTRAVENOUS
  Filled 2020-01-22: qty 1

## 2020-01-22 MED ORDER — ONDANSETRON HCL 4 MG/2ML IJ SOLN
4.0000 mg | Freq: Once | INTRAMUSCULAR | Status: AC
  Administered 2020-01-22: 4 mg via INTRAVENOUS
  Filled 2020-01-22: qty 2

## 2020-01-22 MED ORDER — OXYCODONE HCL 5 MG PO TABS
5.0000 mg | ORAL_TABLET | Freq: Once | ORAL | Status: AC
  Administered 2020-01-22: 5 mg via ORAL
  Filled 2020-01-22: qty 1

## 2020-01-22 MED ORDER — OXYCODONE HCL 5 MG PO TABS: 5.0000 mg | ORAL_TABLET | Freq: Four times a day (QID) | ORAL | 0 refills | Status: DC | PRN

## 2020-01-22 NOTE — ED Triage Notes (Signed)
Pt brought in via ems from [facility].  Pt is having chest pain for 2 days.  States pain is in right wrist going into right arm and neck.  Pt alert.  Hx cva.

## 2020-01-22 NOTE — ED Notes (Signed)
Pt to xray

## 2020-01-22 NOTE — ED Notes (Signed)
Pt states she is allergic to opiates and usually takes benadryl with them. Will advise MD before administration.

## 2020-01-22 NOTE — ED Provider Notes (Signed)
[facility] Emergency Department Provider Note  ____________________________________________   First MD Initiated Contact with Patient 01/22/20 1841     (approximate)  I have reviewed the triage vital signs and the nursing notes.   HISTORY  Chief Complaint Chest Pain    HPI Tyson Masin is a 59 y.o. female with diabetes, hypertension, hyperlipidemia, stroke who is now blind who comes in for chest pain. Patient is coming from [facility].  Patient comes in for right-sided arm pain.  She states that the pain is over the entire arm.  It starts in her right wrist and goes up into her shoulder and across her chest.  The pain also radiates up into her neck.  Is been constant for 2 days, sharp, worse with pushing on it, nothing has made it better.  No shortness of breath.  No coughing.  Denies any falls.  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  She still able to ambulate with assistance.  She does not use a walker.  She denies any weakness in the right arm. Denies any falls/injury.          Past Medical History:  Diagnosis Date  . Blind   . Diabetes mellitus without complication (HCC)   . Hyperlipemia   . Hypertension   . Stroke [facility])     There are no problems to display for this patient.   Past Surgical History:  Procedure Laterality Date  . ABDOMINAL HYSTERECTOMY    . BACK SURGERY    . FRACTURE SURGERY    . SKIN GRAFT Right     Prior to Admission medications   Medication Sig Start Date End Date Taking? Authorizing Provider  atorvastatin (LIPITOR) 20 MG tablet Take 20 mg by mouth every evening.     [provider]  busPIRone (BUSPAR) 10 MG tablet Take 10 mg by mouth 2 (two) times daily.    [provider]  clonazePAM (KLONOPIN) 0.5 MG tablet Take 0.5 mg by mouth every 8 (eight) hours as needed for anxiety. *limit 3 doses per 24 hour*    [provider]  fluticasone (FLONASE) 50 MCG/ACT  nasal spray Place 2 sprays into both nostrils 2 (two) times daily.     [provider]  metFORMIN (GLUCOPHAGE) 500 MG tablet Take 500 mg by mouth 2 (two) times daily.    [provider]  metoprolol (LOPRESSOR) 100 MG tablet Take 100 mg by mouth 2 (two) times daily.    [provider]  mirtazapine (REMERON) 15 MG tablet Take 22.5 mg by mouth at bedtime.    [provider]  nitroGLYCERIN (NITROSTAT) 0.4 MG SL tablet Place 0.4 mg under the tongue every 5 (five) minutes x 3 doses as needed for chest pain.     [provider]  pseudoephedrine (SUDAFED) 30 MG tablet Take 30 mg by mouth every 6 (six) hours as needed for congestion.     [provider]  risperiDONE (RISPERDAL) 2 MG tablet Take 2 mg by mouth at bedtime.    [provider]  tiZANidine (ZANAFLEX) 2 MG tablet Take 2 mg by mouth 3 (three) times daily as needed for muscle spasms.    [provider]  Vitamin D, Ergocalciferol, (DRISDOL) 50000 units CAPS capsule Take 50,000 Units by mouth every 7 (seven) days.    [provider]    Allergies Levaquin [levofloxacin in d5w], Iodinated diagnostic agents, and Latex  No family history on file.  Social History Social History   Tobacco Use  . Smoking status: Never Smoker  . Smokeless tobacco: Never Used  Substance Use Topics  . Alcohol use: No  . Drug use: No      Review of Systems Constitutional: No fever/chills Eyes: No visual changes. ENT: No sore throat. Cardiovascular: Positive chest pain Respiratory: Denies shortness of breath. Gastrointestinal: No abdominal pain.  No nausea, no vomiting.  No diarrhea.  No constipation. Genitourinary: Negative for dysuria. Musculoskeletal: Negative for back pain.  Positive right arm pain Skin: Negative for rash. Neurological: Negative for headaches, focal weakness or numbness. All other ROS negative ____________________________________________   PHYSICAL  EXAM:  VITAL SIGNS: Blood pressure (!) 164/123, pulse 87, temperature 97.9 F (36.6 C), temperature source Oral, resp. rate 16, height 5\' 1"  (1.549 m), weight 77.1 kg, SpO2 100 %.  Constitutional: Alert and oriented. Well appearing and in no acute distress. Eyes: Conjunctivae are normal. Head: Atraumatic. Nose: No congestion/rhinnorhea. Mouth/Throat: Mucous membranes are moist.   Neck: No stridor. Trachea Midline. FROM Cardiovascular: Normal rate, regular rhythm. Grossly normal heart sounds.  Good peripheral circulation.  Tenderness on the right chest wall. Respiratory: Normal respiratory effort.  No retractions. Lungs CTAB. Gastrointestinal: Soft and nontender. No distention. No abdominal bruits.  Musculoskeletal: Tenderness with palpation over the right shoulder.  No swelling evident.  2+ distal pulse.  No joint effusions. Neurologic: Baseline facial droop, left arm contractures and blindness from prior stroke Skin:  Skin is warm, dry and intact. No rash noted. Psychiatric: Mood and affect are normal. Speech and behavior are normal. GU: Deferred   ____________________________________________   LABS (all labs ordered are listed, but only abnormal results are displayed)  Labs Reviewed  BASIC METABOLIC PANEL - Abnormal; Notable for the following components:      Result Value   Glucose, Bld 161 (*)    All other components within normal limits  CBC - Abnormal; Notable for the following components:   WBC 12.9 (*)    RBC 5.24 (*)    Hemoglobin 11.6 (*)    MCV 72.5 (*)    MCH 22.1 (*)    RDW 16.2 (*)    Platelets 477 (*)    All other components within normal limits  HEPATIC FUNCTION PANEL - Abnormal; Notable for the following components:   Total Protein 8.7 (*)    All other components within normal limits  LIPASE, BLOOD  BRAIN NATRIURETIC PEPTIDE  TROPONIN I (HIGH SENSITIVITY)  TROPONIN I (HIGH SENSITIVITY)   ____________________________________________   ED ECG  REPORT I, , the attending physician, personally viewed and interpreted this ECG.  No ST elevation, T wave inversion in aVL V2, normal intervals ____________________________________________  RADIOLOGY Concha Se, personally viewed and evaluated these images (plain radiographs) as part of my medical decision making, as well as reviewing the written report by the radiologist.  ED MD interpretation:  No widen mediastinum   Official radiology report(s): DG Chest 2 View  Result Date: 01/22/2020 CLINICAL DATA:  Chest pain EXAM: CHEST - 2 VIEW COMPARISON:  12/11/2015 FINDINGS: The heart size and mediastinal contours are within normal limits. Aortic atherosclerosis. Both lungs are clear. The visualized skeletal structures are unremarkable. IMPRESSION: No active cardiopulmonary disease. Electronically Signed   By: 12/13/2015 M.D.   On: 01/22/2020 19:34    ____________________________________________   PROCEDURES  Procedure(s) performed (including Critical Care):  Procedures   ____________________________________________   INITIAL IMPRESSION / ASSESSMENT AND PLAN / ED COURSE  [place] Kirchner was evaluated in Emergency Department on 01/22/2020 for the symptoms described in the history of present illness. She was evaluated in the context of the global COVID-19 pandemic, which necessitated consideration that the patient might be at risk for infection with the SARS-CoV-2 virus that causes COVID-19. Institutional protocols and algorithms that pertain to the evaluation of patients at risk for COVID-19 are in a state of rapid change based on information released by regulatory bodies including the CDC and federal and state organizations. These policies and algorithms were followed during the patient's care in the ED.    Most Likely DDx:  -MSK (atypical chest pain)  Given reproducible nature but will get cardiac markers to evaluate for ACS given risk factors/age. Will give dose of IV  morphine/zofran while waiting results.    DDx that was also considered d/t potential to cause harm, but was found less likely based on history and physical (as detailed above): -PNA (no fevers, cough but CXR to evaluate) -PNX (reassured with equal b/l breath sounds, CXR to evaluate) -Symptomatic anemia (will get H&H) -Pulmonary embolism as no sob at rest, not pleuritic in nature, no hypoxia -Aortic Dissection as no tearing pain and no radiation to the mid back, pulses equal -Pericarditis no rub on exam, EKG changes or hx to suggest dx -Tamponade (no notable SOB, tachycardic, hypotensive) -Esophageal rupture (no h/o diffuse vomitting/no crepitus)   Cardiac markers negative x2. Chest xray negative. Pt is HTN however. D/w mom at bedside and daughter that most likely this is msk. Pt does report now having choked on a peanut earlier this morning going into a terrible coughing fit and possibly straining her muscles then.  The pain is very reproducible on exam.  However, given HTN I did discuss possibility of dissection.  Seens less likely but we discussed CT scan. Pt has allergy to contrast and does not want to get CT.  She states she is DNR and even if she had dissection she would not want surgical intervention.  She just wants to have the pain dealt with. She states 3 years ago she was on oral dilaudid and they took her off of it when she came here from [place]. She states she just wants her pain to be better. Mom also understands small risk of something else going on but agrees they both do not want further workup.  I discussed we could prescribe some oxycodone to help with pain until she could get f.u with PCP.  She states oxycodone is like candy to her and does nothing.  I explained I do not feel comfortable prescribing anything stronger especially given she is already on valium and a muscle relaxant at facility. She expressed understanding and states that oxycodone would be okay then.  I  recommended she get palliaitive care involved given she really does not want any invasive medical interventions in future and just wants to focus on comfort. She and mom will look into that. No recent fills for opioids in database so given a short course of oxycodone until she f/u with PCP.  ____________________________________________   FINAL CLINICAL IMPRESSION(S) / ED DIAGNOSES   Final diagnoses:  Chest pain, unspecified type     MEDICATIONS GIVEN DURING THIS VISIT:  Medications  sodium chloride flush (NS) 0.9 % injection 3 mL (3 mLs Intravenous Given 01/22/20 2039)  morphine 4 MG/ML injection 4 mg (4 mg Intravenous Given 01/22/20 2038)  ondansetron (ZOFRAN) injection 4 mg (4 mg Intravenous Given 01/22/20 2038)  diphenhydrAMINE (BENADRYL)  capsule 25 mg (25 mg Oral Given 01/22/20 2005)  oxyCODONE (Oxy IR/ROXICODONE) immediate release tablet 5 mg (5 mg Oral Given 01/22/20 2251)  ketorolac (TORADOL) 30 MG/ML injection 15 mg (15 mg Intravenous Given 01/22/20 2251)     ED Discharge Orders         Ordered    oxyCODONE (ROXICODONE) 5 MG immediate release tablet  Every 6 hours PRN     Discontinue  Reprint     01/22/20 2258           Note:  This document was prepared using Dragon voice recognition software and may include unintentional dictation errors.   Vanessa [place], MD 01/23/20 1146

## 2020-01-22 NOTE — ED Notes (Signed)
Pt given ice as requested.Blood sent to the lab. BP taken off the right lower leg and right arm. Pt given pain meds.

## 2020-01-22 NOTE — ED Notes (Signed)
Report off to sherie rn 

## 2020-01-22 NOTE — ED Notes (Signed)
Pt brought in via ems from [facility].  Pt reports right side chest pain for 2 days.  Pt states pain in right wrist, arm and neck and right chest.  Hx cva x2 per pt.  nsr on monitor.  md at bedside

## 2020-01-22 NOTE — Discharge Instructions (Signed)
I think you most likely strained her muscles from coughing.  We are going to give you a short prescription of oxycodone to help with your pain.  You should talk with your doctors about discussing with palliative to help control your pain.  You return to the ER if you develop any worsening symptoms or any other concerns

## 2020-01-22 NOTE — ED Notes (Signed)
Pt remains hypertensive but states she wont take beta blockers or muscle relaxers. Pt states she was told she needed a CABG and refused.

## 2020-01-24 ENCOUNTER — Observation Stay (HOSPITAL_BASED_OUTPATIENT_CLINIC_OR_DEPARTMENT_OTHER)
Admission: EM | Admit: 2020-01-24 | Discharge: 2020-01-24 | Disposition: A | Discharge status: Nursing Home (Includes ICF) | Payer: Medicare Other | Source: Home / Self Care | Attending: Emergency Medicine | Admitting: Emergency Medicine

## 2020-01-24 ENCOUNTER — Emergency Department: Payer: Medicare Other

## 2020-01-24 ENCOUNTER — Other Ambulatory Visit: Payer: Self-pay

## 2020-01-24 DIAGNOSIS — Z91040 Latex allergy status: Secondary | ICD-10-CM | POA: Insufficient documentation

## 2020-01-24 DIAGNOSIS — E039 Hypothyroidism, unspecified: Secondary | ICD-10-CM | POA: Insufficient documentation

## 2020-01-24 DIAGNOSIS — I214 Non-ST elevation (NSTEMI) myocardial infarction: Secondary | ICD-10-CM

## 2020-01-24 DIAGNOSIS — F419 Anxiety disorder, unspecified: Secondary | ICD-10-CM | POA: Insufficient documentation

## 2020-01-24 DIAGNOSIS — H47612 Cortical blindness, left side of brain: Secondary | ICD-10-CM | POA: Insufficient documentation

## 2020-01-24 DIAGNOSIS — E119 Type 2 diabetes mellitus without complications: Secondary | ICD-10-CM | POA: Insufficient documentation

## 2020-01-24 DIAGNOSIS — Z881 Allergy status to other antibiotic agents status: Secondary | ICD-10-CM | POA: Insufficient documentation

## 2020-01-24 DIAGNOSIS — I10 Essential (primary) hypertension: Secondary | ICD-10-CM | POA: Diagnosis present

## 2020-01-24 DIAGNOSIS — Z79899 Other long term (current) drug therapy: Secondary | ICD-10-CM | POA: Insufficient documentation

## 2020-01-24 DIAGNOSIS — Z7982 Long term (current) use of aspirin: Secondary | ICD-10-CM | POA: Insufficient documentation

## 2020-01-24 DIAGNOSIS — H47619 Cortical blindness, unspecified side of brain: Secondary | ICD-10-CM

## 2020-01-24 DIAGNOSIS — R4781 Slurred speech: Secondary | ICD-10-CM | POA: Diagnosis present

## 2020-01-24 DIAGNOSIS — I700 Atherosclerosis of aorta: Secondary | ICD-10-CM | POA: Insufficient documentation

## 2020-01-24 DIAGNOSIS — Z888 Allergy status to other drugs, medicaments and biological substances status: Secondary | ICD-10-CM | POA: Insufficient documentation

## 2020-01-24 DIAGNOSIS — Z7984 Long term (current) use of oral hypoglycemic drugs: Secondary | ICD-10-CM | POA: Insufficient documentation

## 2020-01-24 DIAGNOSIS — K8020 Calculus of gallbladder without cholecystitis without obstruction: Secondary | ICD-10-CM | POA: Insufficient documentation

## 2020-01-24 DIAGNOSIS — Z66 Do not resuscitate: Secondary | ICD-10-CM | POA: Insufficient documentation

## 2020-01-24 DIAGNOSIS — Z5329 Procedure and treatment not carried out because of patient's decision for other reasons: Secondary | ICD-10-CM | POA: Insufficient documentation

## 2020-01-24 DIAGNOSIS — Z955 Presence of coronary angioplasty implant and graft: Secondary | ICD-10-CM | POA: Insufficient documentation

## 2020-01-24 DIAGNOSIS — I639 Cerebral infarction, unspecified: Secondary | ICD-10-CM | POA: Diagnosis present

## 2020-01-24 DIAGNOSIS — H547 Unspecified visual loss: Secondary | ICD-10-CM

## 2020-01-24 DIAGNOSIS — Z20822 Contact with and (suspected) exposure to covid-19: Secondary | ICD-10-CM | POA: Insufficient documentation

## 2020-01-24 DIAGNOSIS — E785 Hyperlipidemia, unspecified: Secondary | ICD-10-CM | POA: Insufficient documentation

## 2020-01-24 DIAGNOSIS — I69354 Hemiplegia and hemiparesis following cerebral infarction affecting left non-dominant side: Secondary | ICD-10-CM | POA: Insufficient documentation

## 2020-01-24 DIAGNOSIS — I2510 Atherosclerotic heart disease of native coronary artery without angina pectoris: Secondary | ICD-10-CM | POA: Insufficient documentation

## 2020-01-24 DIAGNOSIS — R072 Precordial pain: Secondary | ICD-10-CM

## 2020-01-24 LAB — PROTIME-INR
INR: 1 (ref 0.8–1.2)
Prothrombin Time: 12.8 seconds (ref 11.4–15.2)

## 2020-01-24 LAB — CBC
HCT: 32.7 % — ABNORMAL LOW (ref 36.0–46.0)
Hemoglobin: 10.4 g/dL — ABNORMAL LOW (ref 12.0–15.0)
MCH: 22.2 pg — ABNORMAL LOW (ref 26.0–34.0)
MCHC: 31.8 g/dL (ref 30.0–36.0)
MCV: 69.9 fL — ABNORMAL LOW (ref 80.0–100.0)
Platelets: 453 10*3/uL — ABNORMAL HIGH (ref 150–400)
RBC: 4.68 MIL/uL (ref 3.87–5.11)
RDW: 16.2 % — ABNORMAL HIGH (ref 11.5–15.5)
WBC: 14.5 10*3/uL — ABNORMAL HIGH (ref 4.0–10.5)
nRBC: 0 % (ref 0.0–0.2)

## 2020-01-24 LAB — BASIC METABOLIC PANEL
Anion gap: 12 (ref 5–15)
BUN: 13 mg/dL (ref 6–20)
CO2: 22 mmol/L (ref 22–32)
Calcium: 8.9 mg/dL (ref 8.9–10.3)
Chloride: 100 mmol/L (ref 98–111)
Creatinine, Ser: 0.8 mg/dL (ref 0.44–1.00)
GFR calc Af Amer: >60 mL/min (ref >60)
GFR calc non Af Amer: >60 mL/min (ref >60)
Glucose, Bld: 114 mg/dL — ABNORMAL HIGH (ref 70–99)
Potassium: 4.4 mmol/L (ref 3.5–5.1)
Sodium: 134 mmol/L — ABNORMAL LOW (ref 135–145)

## 2020-01-24 LAB — SARS CORONAVIRUS 2 BY RT PCR (HOSPITAL ORDER, PERFORMED IN ~~LOC~~ HOSPITAL LAB): SARS Coronavirus 2: NEGATIVE

## 2020-01-24 LAB — APTT: aPTT: 26 seconds (ref 24–36)

## 2020-01-24 LAB — TROPONIN I (HIGH SENSITIVITY)
Troponin I (High Sensitivity): 244 ng/L (ref <18)
Troponin I (High Sensitivity): 436 ng/L (ref <18)

## 2020-01-24 MED ORDER — ONDANSETRON 4 MG PO TBDP
4.0000 mg | ORAL_TABLET | Freq: Once | ORAL | Status: AC
  Administered 2020-01-24: 4 mg via ORAL
  Filled 2020-01-24: qty 1

## 2020-01-24 MED ORDER — ACETAMINOPHEN 500 MG PO TABS: 1000.0000 mg | ORAL_TABLET | Freq: Once | ORAL | Status: DC

## 2020-01-24 MED ORDER — HEPARIN BOLUS VIA INFUSION
3900.0000 [IU] | Freq: Once | INTRAVENOUS | Status: AC
  Administered 2020-01-24: 3900 [IU] via INTRAVENOUS
  Filled 2020-01-24: qty 3900

## 2020-01-24 MED ORDER — FLUTICASONE PROPIONATE 50 MCG/ACT NA SUSP: 2.0000 | Freq: Two times a day (BID) | NASAL | Status: DC

## 2020-01-24 MED ORDER — TIZANIDINE HCL 2 MG PO TABS
2.0000 mg | ORAL_TABLET | Freq: Three times a day (TID) | ORAL | Status: DC | PRN
  Filled 2020-01-24: qty 1

## 2020-01-24 MED ORDER — LORAZEPAM 2 MG/ML IJ SOLN
1.0000 mg | Freq: Once | INTRAMUSCULAR | Status: AC
  Administered 2020-01-24: 1 mg via INTRAVENOUS
  Filled 2020-01-24: qty 1

## 2020-01-24 MED ORDER — MORPHINE SULFATE (PF) 4 MG/ML IV SOLN
4.0000 mg | Freq: Once | INTRAVENOUS | Status: AC
  Administered 2020-01-24: 4 mg via INTRAVENOUS
  Filled 2020-01-24: qty 1

## 2020-01-24 MED ORDER — HEPARIN (PORCINE) 25000 UT/250ML-% IV SOLN
800.0000 [IU]/h | INTRAVENOUS | Status: DC
  Administered 2020-01-24: 800 [IU]/h via INTRAVENOUS
  Filled 2020-01-24: qty 250

## 2020-01-24 MED ORDER — METHYLPREDNISOLONE SODIUM SUCC 125 MG IJ SOLR
125.0000 mg | Freq: Once | INTRAMUSCULAR | Status: AC
  Administered 2020-01-24: 125 mg via INTRAVENOUS
  Filled 2020-01-24: qty 2

## 2020-01-24 MED ORDER — BUSPIRONE HCL 5 MG PO TABS: 10.0000 mg | ORAL_TABLET | Freq: Two times a day (BID) | ORAL | Status: DC

## 2020-01-24 MED ORDER — METFORMIN HCL 500 MG PO TABS
500.0000 mg | ORAL_TABLET | Freq: Once | ORAL | Status: AC
  Administered 2020-01-24: 500 mg via ORAL
  Filled 2020-01-24: qty 1

## 2020-01-24 MED ORDER — DIAZEPAM 5 MG PO TABS
10.0000 mg | ORAL_TABLET | Freq: Two times a day (BID) | ORAL | Status: DC
  Filled 2020-01-24: qty 2

## 2020-01-24 MED ORDER — ASPIRIN 300 MG RE SUPP: 300.0000 mg | RECTAL | Status: DC

## 2020-01-24 MED ORDER — DIAZEPAM 5 MG PO TABS
10.0000 mg | ORAL_TABLET | Freq: Once | ORAL | Status: AC
  Administered 2020-01-24: 10 mg via ORAL
  Filled 2020-01-24: qty 2

## 2020-01-24 MED ORDER — LEVOTHYROXINE SODIUM 50 MCG PO TABS: 25.0000 ug | ORAL_TABLET | Freq: Every day | ORAL | Status: DC

## 2020-01-24 MED ORDER — METOPROLOL TARTRATE 50 MG PO TABS: 50.0000 mg | ORAL_TABLET | Freq: Two times a day (BID) | ORAL | Status: DC

## 2020-01-24 MED ORDER — MORPHINE SULFATE (PF) 2 MG/ML IV SOLN: 2.0000 mg | INTRAVENOUS | Status: DC | PRN

## 2020-01-24 MED ORDER — OLOPATADINE HCL 0.1 % OP SOLN
1.0000 [drp] | Freq: Two times a day (BID) | OPHTHALMIC | Status: DC
  Filled 2020-01-24: qty 5

## 2020-01-24 MED ORDER — IBUPROFEN 600 MG PO TABS
600.0000 mg | ORAL_TABLET | Freq: Once | ORAL | Status: AC
  Administered 2020-01-24: 600 mg via ORAL
  Filled 2020-01-24: qty 1

## 2020-01-24 MED ORDER — ONDANSETRON HCL 4 MG/2ML IJ SOLN
4.0000 mg | Freq: Once | INTRAMUSCULAR | Status: AC
  Administered 2020-01-24: 4 mg via INTRAVENOUS
  Filled 2020-01-24: qty 2

## 2020-01-24 MED ORDER — BISACODYL 10 MG RE SUPP
10.0000 mg | RECTAL | Status: DC | PRN
  Filled 2020-01-24: qty 1

## 2020-01-24 MED ORDER — DIPHENHYDRAMINE HCL 50 MG/ML IJ SOLN
50.0000 mg | Freq: Once | INTRAMUSCULAR | Status: AC
  Administered 2020-01-24: 50 mg via INTRAVENOUS
  Filled 2020-01-24: qty 1

## 2020-01-24 MED ORDER — PANTOPRAZOLE SODIUM 20 MG PO TBEC
20.0000 mg | DELAYED_RELEASE_TABLET | Freq: Every day | ORAL | Status: DC
  Filled 2020-01-24 (×2): qty 1

## 2020-01-24 MED ORDER — MIRTAZAPINE 15 MG PO TABS: 22.5000 mg | ORAL_TABLET | Freq: Every day | ORAL | Status: DC

## 2020-01-24 MED ORDER — ATORVASTATIN CALCIUM 20 MG PO TABS: 80.0000 mg | ORAL_TABLET | Freq: Every day | ORAL | Status: DC

## 2020-01-24 MED ORDER — NITROGLYCERIN IN D5W 200-5 MCG/ML-% IV SOLN: 0.0000 ug/min | INTRAVENOUS | Status: DC

## 2020-01-24 MED ORDER — FENTANYL CITRATE (PF) 100 MCG/2ML IJ SOLN: 50.0000 ug | Freq: Once | INTRAMUSCULAR | Status: DC

## 2020-01-24 MED ORDER — VITAMIN B-12 1000 MCG PO TABS
1000.0000 ug | ORAL_TABLET | Freq: Every day | ORAL | Status: DC
  Filled 2020-01-24 (×2): qty 1

## 2020-01-24 MED ORDER — ONDANSETRON HCL 4 MG/2ML IJ SOLN: 4.0000 mg | Freq: Four times a day (QID) | INTRAMUSCULAR | Status: DC | PRN

## 2020-01-24 MED ORDER — NITROGLYCERIN 0.4 MG SL SUBL: 0.4000 mg | SUBLINGUAL_TABLET | SUBLINGUAL | Status: DC | PRN

## 2020-01-24 MED ORDER — ASPIRIN 81 MG PO CHEW
324.0000 mg | CHEWABLE_TABLET | Freq: Once | ORAL | Status: AC
  Administered 2020-01-24: 324 mg via ORAL
  Filled 2020-01-24: qty 4

## 2020-01-24 MED ORDER — ACETAMINOPHEN 325 MG PO TABS: 650.0000 mg | ORAL_TABLET | ORAL | Status: DC | PRN

## 2020-01-24 MED ORDER — ASPIRIN 81 MG PO CHEW: 324.0000 mg | CHEWABLE_TABLET | ORAL | Status: DC

## 2020-01-24 MED ORDER — INSULIN ASPART 100 UNIT/ML ~~LOC~~ SOLN: 0.0000 [IU] | Freq: Three times a day (TID) | SUBCUTANEOUS | Status: DC

## 2020-01-24 MED ORDER — CLONAZEPAM 0.5 MG PO TABS: 0.5000 mg | ORAL_TABLET | Freq: Three times a day (TID) | ORAL | Status: DC | PRN

## 2020-01-24 MED ORDER — IOHEXOL 350 MG/ML SOLN
75.0000 mL | Freq: Once | INTRAVENOUS | Status: AC | PRN
  Administered 2020-01-24: 75 mL via INTRAVENOUS

## 2020-01-24 MED ORDER — ASPIRIN EC 81 MG PO TBEC: 81.0000 mg | DELAYED_RELEASE_TABLET | Freq: Every day | ORAL | Status: DC

## 2020-01-24 MED ORDER — RISPERIDONE 1 MG PO TABS: 2.0000 mg | ORAL_TABLET | Freq: Every day | ORAL | Status: DC

## 2020-01-24 NOTE — ED Notes (Signed)
Introduced myself to pt. Provided pt w/ cup of ice and warm blanket.

## 2020-01-24 NOTE — ED Notes (Signed)
Pt trx to CT.  

## 2020-01-24 NOTE — TOC Progression Note (Signed)
Transition of Care [facility]) - Progression Note    Patient Details  Name: Victoria Medina MRN: [id number] Date of Birth: 1961/03/22  Transition of [facility] CM/SW [place], LCSW Phone Number: 01/24/2020, 4:57 PM  Clinical Narrative:  [facility] CM/SW met with the patient who noted that she wants to return to [facility].  Stated that she does not want to be admitted.  Stated that she would like to return to facility with palliative or hospice care.      Expected Discharge Plan and Services: Discharge to [facility] w/Hospice services.      Social Determinants of Health (SDOH) Interventions   Readmission Risk Interventions No flowsheet data found.

## 2020-01-24 NOTE — ED Notes (Signed)
Pt refused DC VS. Pt c/o BP cuff being too tight and painful.

## 2020-01-24 NOTE — ED Provider Notes (Signed)
Patient is leaving AGAINST MEDICAL ADVICE and is on palliative care, having been seen by social work   Emily Filbert, MD 01/24/20 [phone]

## 2020-01-24 NOTE — H&P (Signed)
History and Physical    [name] CWC:[id number] DOB: August 04, 1960 DOA: 01/24/2020  PCP: Venia Carbon, MD   Patient coming from: [place] have personally briefly reviewed patient's old medical records in [place]  Chief Complaint: Chest pain  HPI: Victoria Medina is a 59 y.o. 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  She describes the pain as a sharp pain rated 10 x 10 in intensity at its worst and worse with movement.  Chest pain is associated with nausea and vomiting but she denies having any shortness of breath or diaphoresis. Patient was seen in the emergency room a couple of days ago and at that time her chest pain was thought to be musculoskeletal and she was discharged home after she had 2 - troponin tests Her troponin today is elevated 244 >> 436 Twelve-lead EKG shows sinus rhythm with diffuse repolarization changes She continues to have chest pain and received IV morphine in the ER but is requesting for ibuprofen since that helps her pain better. Patient is a DO NOT RESUSCITATE   ED Course: Patient is a 59 year old with a history of CVA with left sided hemiparesis, diabetes mellitus and hypertension who presents to the emergency room for evaluation of chest pain which is midsternal with radiation to both shoulders associated with nausea and vomiting.  Patient does not have any acute ST-T wave changes on her EKG but has a bump in her troponin.  She will be admitted to the hospital for further evaluation.  Review of Systems: As per HPI otherwise 10 point review of systems negative.   Past Medical History:  Diagnosis Date  . Blind   . Diabetes mellitus without complication ([place])   . Hyperlipemia   .  Hypertension   . Stroke [facility])     Past Surgical History:  Procedure Laterality Date  . ABDOMINAL HYSTERECTOMY    . BACK SURGERY    . FRACTURE SURGERY    . SKIN GRAFT Right      reports that she has never smoked. She has never used smokeless tobacco. She reports that she does not drink alcohol and does not use drugs.  Allergies  Allergen Reactions  . Levaquin [Levofloxacin In D5w] Anaphylaxis and Swelling  . Iodinated Diagnostic Agents Itching    Severe itching  . Latex Dermatitis    History reviewed. No pertinent family history.   Prior to Admission and present the patient and I placed again medications   Medication Sig Start Date End Date Taking? Authorizing Provider  aspirin EC 81 MG tablet Take 81 mg by mouth daily. Swallow whole.   Yes [provider]  Azelastine HCl 0.15 % SOLN Place 2 sprays into both nostrils 2 (two) times daily. 01/14/20  Yes [provider]  clonazePAM (KLONOPIN) 2 MG tablet Take 0.5 mg by mouth every 8 (eight) hours as needed for anxiety. *limit 3 doses per 24 hour*    Yes [provider]  diazepam (VALIUM) 10 MG tablet Take 10 mg by mouth 2 (two) times daily.   Yes [provider]  lansoprazole (PREVACID) 15 MG capsule Take 15 mg by mouth daily at 12 noon.   Yes [provider]  levothyroxine (SYNTHROID) 25 MCG tablet Take 25  mcg by mouth daily before breakfast.   Yes [provider]  metFORMIN (GLUCOPHAGE) 500 MG tablet Take 500 mg by mouth 2 (two) times daily.   Yes [provider]  Olopatadine HCl 0.2 % SOLN Place 1 drop into both eyes daily at 6 (six) AM.   Yes [provider]  tiZANidine (ZANAFLEX) 2 MG tablet Take 2 mg by mouth 3 (three) times daily as needed for muscle spasms.   Yes [provider]  vitamin B-12 (CYANOCOBALAMIN) 1000 MCG tablet Take 1,000 mcg by mouth daily.   Yes [provider]  Vitamin D, Ergocalciferol, (DRISDOL) 50000 units CAPS capsule  Take 50,000 Units by mouth every 7 (seven) days.   Yes [provider]  atorvastatin (LIPITOR) 20 MG tablet Take 20 mg by mouth every evening.  Patient not taking: Reported on 01/22/2020    [provider]  bisacodyl (DULCOLAX) 10 MG suppository Place 10 mg rectally as needed for moderate constipation.    [provider]  busPIRone (BUSPAR) 10 MG tablet Take 10 mg by mouth 2 (two) times daily. Patient not taking: Reported on 01/22/2020    [provider]  fluticasone (FLONASE) 50 MCG/ACT nasal spray Place 2 sprays into both nostrils 2 (two) times daily.  Patient not taking: Reported on 01/22/2020    [provider]  ibuprofen (ADVIL) 200 MG tablet Take 400 mg by mouth every 8 (eight) hours as needed for mild pain.    [provider]  magnesium citrate SOLN Take 0.5 Bottles by mouth once a week.    [provider]  metoprolol (LOPRESSOR) 100 MG tablet Take 100 mg by mouth 2 (two) times daily. Patient not taking: Reported on 01/24/2020    [provider]  mirtazapine (REMERON) 15 MG tablet Take 22.5 mg by mouth at bedtime. Patient not taking: Reported on 01/22/2020    [provider]  nitroGLYCERIN (NITROSTAT) 0.4 MG SL tablet Place 0.4 mg under the tongue every 5 (five) minutes x 3 doses as needed for chest pain.     [provider]  ondansetron (ZOFRAN) 4 MG tablet Take 4 mg by mouth every 8 (eight) hours as needed for nausea or vomiting.    [provider]  oxyCODONE (ROXICODONE) 5 MG immediate release tablet Take 1 tablet (5 mg total) by mouth every 6 (six) hours as needed for up to 3 days. 01/22/20 01/25/20  Concha Se, MD  pseudoephedrine (SUDAFED) 30 MG tablet Take 30 mg by mouth every 6 (six) hours as needed for congestion.     [provider]  risperiDONE (RISPERDAL) 2 MG tablet Take 2 mg by mouth at bedtime. Patient not taking: Reported on 01/22/2020    [provider]     Physical Exam: Vitals:   01/24/20 0903 01/24/20 1032 01/24/20 1100 01/24/20 1249  BP: (!) 166/75 103/87 (!) 131/92 (!) 163/81  Pulse: 80 92 79 92  Resp: (!) 23 19 18 16   Temp:      TempSrc:      SpO2: 100% 100% 95% 99%  Weight:      Height:         Vitals:   01/24/20 0903 01/24/20 1032 01/24/20 1100 01/24/20 1249  BP: (!) 166/75 103/87 (!) 131/92 (!) 163/81  Pulse: 80 92 79 92  Resp: (!) 23 19 18 16   Temp:      TempSrc:      SpO2: 100% 100% 95% 99%  Weight:  Height:        Constitutional: NAD, alert and oriented x 3 Eyes: PERRL, lids and conjunctivae normal ENMT: Mucous membranes are moist.  Neck: normal, supple, no masses, no thyromegaly Respiratory: clear to auscultation bilaterally, no wheezing, no crackles. Normal respiratory effort. No accessory muscle use.  Cardiovascular: Regular rate and rhythm, no murmurs / rubs / gallops. No extremity edema. 2+ pedal pulses. No carotid bruits.  Abdomen: no tenderness, no masses palpated. No hepatosplenomegaly. Bowel sounds positive.  Musculoskeletal: no clubbing / cyanosis.  Left-sided hemiparesis with contracture of her left upper extremity.  Skin: no rashes, lesions, ulcers.  Neurologic: Contracture of left upper extremity and left-sided hemiparesis told her to call Dr. Psychiatric: Normal mood and affect.   Labs on Admission: I have personally reviewed following labs and imaging studies  CBC: Recent Labs  Lab 01/22/20 1906 01/24/20 0846  WBC 12.9* 14.5*  HGB 11.6* 10.4*  HCT 38.0 32.7*  MCV 72.5* 69.9*  PLT 477* 453*   Basic Metabolic Panel: Recent Labs  Lab 01/22/20 1906 01/24/20 0859  NA 138 134*  K 4.2 4.4  CL 98 100  CO2 25 22  GLUCOSE 161* 114*  BUN 14 13  CREATININE 0.89 0.80  CALCIUM 9.8 8.9   GFR: Estimated Creatinine Clearance: 72 mL/min (by C-G formula based on SCr of 0.8 mg/dL). Liver Function Tests: Recent Labs  Lab 01/22/20 1906  AST 17  ALT 12  ALKPHOS 86  BILITOT 0.6   PROT 8.7*  ALBUMIN 4.6   Recent Labs  Lab 01/22/20 1906  LIPASE 46   No results for input(s): AMMONIA in the last 168 hours. Coagulation Profile: Recent Labs  Lab 01/24/20 1315  INR 1.0   Cardiac Enzymes: No results for input(s): CKTOTAL, CKMB, CKMBINDEX, TROPONINI in the last 168 hours. BNP (last 3 results) No results for input(s): PROBNP in the last 8760 hours. HbA1C: No results for input(s): HGBA1C in the last 72 hours. CBG: No results for input(s): GLUCAP in the last 168 hours. Lipid Profile: No results for input(s): CHOL, HDL, LDLCALC, TRIG, CHOLHDL, LDLDIRECT in the last 72 hours. Thyroid Function Tests: No results for input(s): TSH, T4TOTAL, FREET4, T3FREE, THYROIDAB in the last 72 hours. Anemia Panel: No results for input(s): VITAMINB12, FOLATE, FERRITIN, TIBC, IRON, RETICCTPCT in the last 72 hours. Urine analysis:    Component Value Date/Time   COLORURINE YELLOW (A) 05/28/2016 1545   APPEARANCEUR CLEAR (A) 05/28/2016 1545   APPEARANCEUR CLOUDY 08/05/2014 1906   LABSPEC 1.006 05/28/2016 1545   LABSPEC 1.026 08/05/2014 1906   PHURINE 7.0 05/28/2016 1545   GLUCOSEU NEGATIVE 05/28/2016 1545   GLUCOSEU NEGATIVE 08/05/2014 1906   HGBUR NEGATIVE 05/28/2016 1545   BILIRUBINUR NEGATIVE 05/28/2016 1545   BILIRUBINUR NEGATIVE 08/05/2014 1906   KETONESUR NEGATIVE 05/28/2016 1545   PROTEINUR NEGATIVE 05/28/2016 1545   NITRITE NEGATIVE 05/28/2016 1545   LEUKOCYTESUR NEGATIVE 05/28/2016 1545   LEUKOCYTESUR 2+ 08/05/2014 1906    Radiological Exams on Admission: DG Chest 2 View  Result Date: 01/22/2020 CLINICAL DATA:  Chest pain EXAM: CHEST - 2 VIEW COMPARISON:  12/11/2015 FINDINGS: The heart size and mediastinal contours are within normal limits. Aortic atherosclerosis. Both lungs are clear. The visualized skeletal structures are unremarkable. IMPRESSION: No active cardiopulmonary disease. Electronically Signed   By: Jasmine Pang M.D.   On: 01/22/2020 19:34   CT  Angio Chest PE W and/or Wo Contrast  Result Date: 01/24/2020 CLINICAL DATA:  59 year old with chest wall pain. Concern for pulmonary embolism.  EXAM: CT ANGIOGRAPHY CHEST TECHNIQUE: Multidetector CT imaging through the chest using the standard protocol during bolus administration of intravenous contrast. Multiplanar reconstructed images and MIPs were obtained and reviewed to evaluate the vascular anatomy. CONTRAST:  43mL OMNIPAQUE IOHEXOL 350 MG/ML SOLN COMPARISON:  Chest radiograph 01/24/2020 FINDINGS: CTA CHEST FINDINGS Cardiovascular: Satisfactory opacification of the pulmonary arteries to the segmental level. No evidence of pulmonary embolism. Normal heart size. No pericardial effusion. Coronary artery calcifications. Normal caliber of the thoracic aorta with atherosclerotic calcifications. Mediastinum/Nodes: Small axillary lymph nodes. No significant mediastinal or hilar lymph node enlargement. Lungs/Pleura: Trachea and mainstem bronchi are patent. No large pleural effusions. Lungs are clear without airspace disease or consolidation. Upper abdomen: High-density structure in the gallbladder that measures roughly 1.1 cm and suggestive for gallstone. No acute abnormality in the upper abdomen. Musculoskeletal: No acute bone abnormality. Review of the MIP images confirms the above findings. IMPRESSION: 1. No acute chest abnormality.  No evidence for pulmonary embolism. 2. Aortic Atherosclerosis (ICD10-I70.0). Coronary artery calcifications. 3. Cholelithiasis. Electronically Signed   By: Richarda Overlie M.D.   On: 01/24/2020 12:10   DG Chest Portable 1 View  Result Date: 01/24/2020 CLINICAL DATA:  Chest pain for 5 days. EXAM: PORTABLE CHEST 1 VIEW COMPARISON:  01/22/2020 FINDINGS: Patient is rotated to the left. Low lung volumes are seen. Heart size is within normal limits. Aortic atherosclerosis noted. Both lungs are clear. IMPRESSION: Low lung volumes. No active disease. Electronically Signed   By: Danae Orleans  M.D.   On: 01/24/2020 09:55    EKG: Independently reviewed.  Sinus rhythm Nonspecific repolarization changes  Assessment/Plan Principal Problem:   NSTEMI (non-ST elevated myocardial infarction) (HCC) Active Problems:   Stroke (HCC)   Diabetes mellitus without complication (HCC)   Hypertension   Blind   Hypothyroidism (acquired)   Anxiety    Non-ST elevation MI In a patient with a history of diabetes mellitus and CVA Patient presents for evaluation of midsternal chest pain with radiation to both shoulders associated with nausea and vomiting She brought her troponin from 244 >> 436 We will start patient on a heparin drip as well as nitroglycerin drip Place patient on aspirin, statins and beta-blockers   Diabetes mellitus We will place patient on a clear liquid diet Sliding scale coverage with Accu-Cheks before meals and at bedtime   Hypertension Continue metoprolol   Cortical blindness She requires assistance with activities of daily living    Hypothyroidism Continue Synthroid   Anxiety disorder Continue buspirone, clonazepam and Valium  DVT prophylaxis: Heparin Code Status: DNR Family Communication: Greater than 50% of time was spent discussing plan of care with patient at the bedside, she verbalizes understanding and agrees with the plan of care.  All questions and concerns have been addressed. Disposition Plan: Back to previous home environment Consults called: Cardiology    Lucile Shutters MD [facility]     01/24/2020, 2:38 PM

## 2020-01-24 NOTE — ED Notes (Signed)
ED tech Cala Bradford provided pt w/ saltines and diet coke per pt request.

## 2020-01-24 NOTE — ED Notes (Signed)
Pt insisted IV be removed for comfort. I spoke w/ charge nurse Brandy and removed IV.

## 2020-01-24 NOTE — ED Notes (Signed)
Pt stated to this RN "I "died" in 19-Nov-2012. I don't want to be in the hospital I want to be comfortable in my bed".

## 2020-01-24 NOTE — ED Notes (Signed)
Spoke w/ Sammuel Cooper RN at [facility] and told her that Ms. Carton was leaving AMA and a palliative care consult had been started.

## 2020-01-24 NOTE — ED Triage Notes (Signed)
Pt comes EMS from [place] with chest wall pain starting Monday. Was seen Monday and cleared. Chest pain continued. Tender to any touch. Pt has significant cardiac and neuro history.

## 2020-01-24 NOTE — ED Notes (Signed)
Provided update to pt's sister Tawni Carnes per pt's request.

## 2020-01-24 NOTE — ED Notes (Signed)
Critical troponin of 244 reported to Dr Scotty Court

## 2020-01-24 NOTE — ED Provider Notes (Signed)
[facility] Emergency Department Provider Note  ____________________________________________  Time seen: Approximately 8:48 AM  I have reviewed the triage vital signs and the nursing notes.   HISTORY  Chief Complaint Chest Pain    HPI Victoria Medina is a 59 y.o. 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  Denies shortness of breath.  Rated as severe, 10/10.  Patient has a valid DNR accompanying her as well as a physician note indicating goal of care that patient should not be hospitalized.  Recent ED evaluation notes that patient is considering transitioning to comfort care but has not yet made this decision.  After her recent ED visit, she was prescribed muscle relaxant and pain medication without relief.   Patient also notes generalized abdominal pain for the past 5 days as well.   Past Medical History:  Diagnosis Date  . Blind   . Diabetes mellitus without complication (HCC)   . Hyperlipemia   . Hypertension   . Stroke [facility])      There are no problems to display for this patient.    Past Surgical History:  Procedure Laterality Date  . ABDOMINAL HYSTERECTOMY    . BACK SURGERY    . FRACTURE SURGERY    . SKIN GRAFT Right      Prior to Admission medications   Medication Sig Start Date End Date Taking? Authorizing Provider  aspirin EC 81 MG tablet Take 81 mg by mouth daily. Swallow whole.    [provider]  atorvastatin (LIPITOR) 20 MG tablet Take 20 mg by mouth every evening.  Patient not taking: Reported on 01/22/2020    [provider]  Azelastine HCl 0.15 % SOLN Place 2 sprays into both nostrils 2 (two) times daily. 01/14/20   [provider]  bisacodyl (DULCOLAX) 10 MG suppository Place 10 mg rectally  as needed for moderate constipation.    [provider]  busPIRone (BUSPAR) 10 MG tablet Take 10 mg by mouth 2 (two) times daily. Patient not taking: Reported on 01/22/2020    [provider]  clonazePAM (KLONOPIN) 2 MG tablet Take 0.5 mg by mouth every 8 (eight) hours as needed for anxiety. *limit 3 doses per 24 hour*     [provider]  diazepam (VALIUM) 10 MG tablet Take 10 mg by mouth 2 (two) times daily.    [provider]  fluticasone (FLONASE) 50 MCG/ACT nasal spray Place 2 sprays into both nostrils 2 (two) times daily.  Patient not taking: Reported on 01/22/2020    [provider]  ibuprofen (ADVIL) 200 MG tablet Take 400 mg by mouth every 8 (eight) hours as needed for mild pain.    [provider]  lansoprazole (PREVACID) 15 MG capsule Take 15 mg by mouth daily at 12 noon.    [provider]  levothyroxine (SYNTHROID) 25 MCG tablet Take 25 mcg by mouth daily before breakfast.    [provider]  magnesium citrate SOLN Take 0.5 Bottles by mouth once a week.    [provider]  metFORMIN (GLUCOPHAGE) 500 MG tablet Take 500 mg by mouth 2 (two) times daily.    [provider]  metoprolol (LOPRESSOR) 100 MG tablet Take 100 mg by mouth 2 (two) times daily.    [provider]  mirtazapine (REMERON) 15 MG tablet Take 22.5 mg by mouth at bedtime. Patient not taking: Reported on 01/22/2020    [provider]  nitroGLYCERIN (NITROSTAT) 0.4 MG SL tablet Place 0.4 mg under the tongue every 5 (five) minutes x 3 doses as needed for chest pain.     [provider]  Olopatadine HCl 0.2 % SOLN Place 1 drop into both eyes daily at 6 (six) AM.    [provider]  ondansetron (ZOFRAN) 4 MG tablet Take 4 mg by mouth every 8 (eight) hours as needed for nausea or vomiting.    [provider]  oxyCODONE (ROXICODONE) 5 MG immediate release tablet Take 1 tablet (5 mg total) by mouth  every 6 (six) hours as needed for up to 3 days. 01/22/20 01/25/20  Vanessa Kershaw, MD  pseudoephedrine (SUDAFED) 30 MG tablet Take 30 mg by mouth every 6 (six) hours as needed for congestion.     [provider]  risperiDONE (RISPERDAL) 2 MG tablet Take 2 mg by mouth at bedtime. Patient not taking: Reported on 01/22/2020    [provider]  tiZANidine (ZANAFLEX) 2 MG tablet Take 2 mg by mouth 3 (three) times daily as needed for muscle spasms.    [provider]  vitamin B-12 (CYANOCOBALAMIN) 1000 MCG tablet Take 1,000 mcg by mouth daily.    [provider]  Vitamin D, Ergocalciferol, (DRISDOL) 50000 units CAPS capsule Take 50,000 Units by mouth every 7 (seven) days.    [provider]     Allergies Levaquin [levofloxacin in d5w], Iodinated diagnostic agents, and Latex   History reviewed. No pertinent family history.  Social History Social History   Tobacco Use  . Smoking status: Never Smoker  . Smokeless tobacco: Never Used  Substance Use Topics  . Alcohol use: No  . Drug use: No    Review of Systems  Constitutional:   No fever or chills.  ENT:   No sore throat. No rhinorrhea. Cardiovascular: Positive chest pain as above without syncope. Respiratory:   No dyspnea or cough. Gastrointestinal:   Positive generalized abdominal pain x5 days, no vomiting constipation or diarrhea Musculoskeletal:   Chronic left-sided paralysis All other systems reviewed and are negative except as documented above in ROS and HPI.  ____________________________________________   PHYSICAL EXAM:  VITAL SIGNS: ED Triage Vitals  Enc Vitals Group     BP 01/24/20 0845 (!) 148/91     Pulse Rate 01/24/20 0845 85     Resp 01/24/20 0845 (!) 27     Temp 01/24/20 0845 98.7 F (37.1 C)     Temp Source 01/24/20 0845 Oral     SpO2 01/24/20 0845 100 %     Weight 01/24/20 0840 169 lb 12.1 oz (77 kg)     Height 01/24/20 0840 5\' 1"  (1.549 m)     Head Circumference --       Peak Flow --      Pain Score 01/24/20 0840 10     Pain Loc --      Pain Edu? --      Excl. in [place]? --     Vital signs reviewed, nursing assessments reviewed.   Constitutional:   Alert and oriented. Non-toxic appearance. Eyes:   Conjunctivae are normal. EOMI. PERRL. ENT      Head:   Normocephalic and atraumatic.      Nose:   Normal      Mouth/Throat: Moist mucosa, no tongue laceration, no pharyngeal erythema.  Neck:   No meningismus. Full ROM. Hematological/Lymphatic/Immunilogical:   No cervical lymphadenopathy. Cardiovascular:   RRR. Symmetric bilateral radial and DP pulses.  No murmurs. Cap refill less than 2 seconds. Respiratory:   Normal respiratory effort without tachypnea/retractions. Breath sounds are clear and equal bilaterally. No wheezes/rales/rhonchi. Gastrointestinal:   Soft with mild generalized tenderness, nonfocal. Non distended. There is no CVA tenderness.  No rebound, rigidity, or guarding.  Musculoskeletal:   Normal range of motion in all extremities. No joint effusions.  No lower extremity tenderness.  No edema. Neurologic:   Normal speech and language.  Right-sided motor intact, left-sided paralysis at baseline No acute focal neurologic deficits are appreciated.  Chronic neuro deficits noted, at baseline. Skin:    Skin is warm, dry and intact. No rash noted.  No petechiae, purpura, or bullae.  ____________________________________________    LABS (pertinent positives/negatives) (all labs ordered are listed, but only abnormal results are displayed) Labs Reviewed  BASIC METABOLIC PANEL - Abnormal; Notable for the following components:      Result Value   Sodium 134 (*)    Glucose, Bld 114 (*)    All other components within normal limits  CBC - Abnormal; Notable for the following components:   WBC 14.5 (*)    Hemoglobin 10.4 (*)    HCT 32.7 (*)    MCV 69.9 (*)    MCH 22.2 (*)    RDW 16.2 (*)    Platelets 453 (*)    All other components within  normal limits  TROPONIN I (HIGH SENSITIVITY) - Abnormal; Notable for the following components:   Troponin I (High Sensitivity) 244 (*)    All other components within normal limits  TROPONIN I (HIGH SENSITIVITY) - Abnormal; Notable for the following components:   Troponin I (High Sensitivity) 436 (*)    All other components within normal limits  SARS CORONAVIRUS 2 BY RT PCR (HOSPITAL ORDER, PERFORMED IN  HOSPITAL LAB)  APTT  PROTIME-INR  POC URINE PREG, ED   ____________________________________________   EKG  Interpreted by me Sinus rhythm rate of 83, normal axis and intervals.  Normal QRS ST segments and T waves.  ____________________________________________    RADIOLOGY  CT Angio Chest PE W and/or Wo Contrast  Result Date: 01/24/2020 CLINICAL DATA:  59 year old with chest wall pain. Concern for pulmonary embolism. EXAM: CT ANGIOGRAPHY CHEST TECHNIQUE: Multidetector CT imaging through the chest using the standard protocol during bolus administration of intravenous contrast. Multiplanar reconstructed images and MIPs were obtained and reviewed to evaluate the vascular anatomy. CONTRAST:  74mL OMNIPAQUE IOHEXOL 350 MG/ML SOLN COMPARISON:  Chest radiograph 01/24/2020 FINDINGS: CTA CHEST FINDINGS Cardiovascular: Satisfactory opacification of the pulmonary arteries to the segmental level. No evidence of pulmonary embolism. Normal heart size. No pericardial effusion. Coronary artery calcifications. Normal caliber of the thoracic aorta with atherosclerotic calcifications. Mediastinum/Nodes: Small axillary lymph nodes. No significant mediastinal or hilar lymph node enlargement. Lungs/Pleura: Trachea and mainstem bronchi are patent. No large pleural effusions. Lungs are clear without airspace disease or consolidation. Upper abdomen: High-density structure in the gallbladder that measures roughly 1.1 cm and suggestive for gallstone. No acute abnormality in the upper abdomen.  Musculoskeletal: No acute bone abnormality. Review of the MIP images confirms the above findings. IMPRESSION: 1. No acute chest abnormality.  No evidence for pulmonary embolism. 2. Aortic Atherosclerosis (ICD10-I70.0). Coronary artery calcifications. 3. Cholelithiasis. Electronically Signed   By: Richarda Overlie M.D.   On: 01/24/2020 12:10   DG Chest Portable 1 View  Result Date: 01/24/2020 CLINICAL DATA:  Chest pain for 5 days. EXAM: PORTABLE CHEST 1 VIEW COMPARISON:  01/22/2020 FINDINGS: Patient is rotated to the left. Low lung volumes are seen. Heart size is within normal limits. Aortic atherosclerosis noted. Both lungs are clear. IMPRESSION: Low lung volumes. No active disease. Electronically Signed   By: Danae Orleans M.D.   On: 01/24/2020 09:55    ____________________________________________   PROCEDURES .Critical Care Performed by: Sharman Cheek, MD Authorized by: Sharman Cheek, MD   Critical care provider statement:    Critical care time (minutes):  35   Critical care time was exclusive of:  Separately billable procedures and treating other patients   Critical care was necessary to treat or prevent imminent or life-threatening deterioration of the following conditions:  Cardiac failure   Critical care was time spent personally by me on the following activities:  Development of treatment plan with patient or surrogate, discussions with consultants, evaluation of patient's response to treatment, examination of patient, obtaining history from patient or surrogate, ordering and performing treatments and interventions, ordering and review of laboratory studies, ordering and review of radiographic studies, pulse oximetry, re-evaluation of patient's condition, review of old charts and blood draw for specimens Comments:        Angiocath insertion  Date/Time: 01/24/2020 1:09 PM Performed by: Sharman Cheek, MD Authorized by: Sharman Cheek, MD  Preparation: Patient was prepped and  draped in the usual sterile fashion. Local anesthesia used: no  Anesthesia: Local anesthesia used: no  Sedation: Patient sedated: no  Patient tolerance: patient tolerated the procedure well with no immediate complications Comments: Continuous [place] visualization, L basilic vein     ____________________________________________  DIFFERENTIAL DIAGNOSIS   Chest wall pain, dissection, PE, ptx  CLINICAL IMPRESSION / ASSESSMENT AND PLAN / ED COURSE  Medications ordered in the ED: Medications  nitroGLYCERIN (NITROSTAT) SL tablet 0.4 mg (has no administration in time range)  aspirin chewable tablet 324 mg (has no administration in time range)  acetaminophen (TYLENOL) tablet 1,000 mg (has no administration in time range)  fentaNYL (SUBLIMAZE) injection 50 mcg (has no administration in time range)  ondansetron (ZOFRAN) injection 4 mg (4 mg Intravenous Given 01/24/20 1020)  morphine 4 MG/ML injection 4 mg (4 mg Intravenous Given 01/24/20 1020)  diphenhydrAMINE (BENADRYL) injection 50 mg (50 mg Intravenous Given 01/24/20 1028)  methylPREDNISolone sodium succinate (SOLU-MEDROL) 125 mg/2 mL injection 125 mg (125 mg Intravenous Given 01/24/20 1028)  LORazepam (ATIVAN) injection 1 mg (1 mg Intravenous Given 01/24/20 1105)  iohexol (OMNIPAQUE) 350 MG/ML injection 75 mL (75 mLs Intravenous Contrast Given 01/24/20 1134)    Pertinent labs & imaging results that were available during my care of the patient were reviewed by me and considered in my medical decision making (see chart for details).  Abryana Lykens was evaluated in Emergency Department on 01/24/2020 for the symptoms described in the history of present illness. She was evaluated in the context of the global COVID-19 pandemic, which necessitated consideration that the patient might be at risk for infection with the SARS-CoV-2 virus that causes COVID-19. Institutional protocols and algorithms that pertain to the evaluation of patients at risk for  COVID-19 are in a state of rapid change based on information released by regulatory bodies including the CDC and federal and state organizations. These policies and algorithms were followed during the patient's care in the ED.   Patient presents with severe anterior chest pain.  Patient represents it is reproducible on exam, but she is also severely  anxious, so I wonder if she is exaggerating the tenderness.  More worried that she may be having PE or dissection or non-STEMI.  Will repeat labs, obtain CT angiogram of the chest.  Initial vital signs unremarkable.  Will give IV morphine 4 mg and Zofran for symptom relief.  Clinical Course as of Jan 24 1308  Sat Jan 24, 2020  1014 PIV placed by me. Pt confirms only contrast reaction symptom is itching, and it was long time ago. Trop 244 is new compared to 2 days ago. Pt denies having directive against hospitalization. She is amenable to further workup and management of this finding and her acute severe pain.  For her itching reaction to contrast, this is unlikely to represent a significant allergy, and the possibility of PE or TAD is potentially life threatening. Will proceed with CT without delay after giving benadryl and solumedrol .    [PS]    Clinical Course User Index [PS] Sharman Cheek, MD     ----------------------------------------- 1:09 PM on 01/24/2020 -----------------------------------------  CT scan unremarkable, no PE or dissection.  Troponin has been uptrending from 2 44-4 36.  Will treat as non-STEMI, start heparin, give aspirin, sublingual nitroglycerin for pain.  Case discussed with the hospitalist for further management.  ____________________________________________   FINAL CLINICAL IMPRESSION(S) / ED DIAGNOSES    Final diagnoses:  NSTEMI (non-ST elevated myocardial infarction) (HCC)  Precordial pain  Type 2 diabetes mellitus without complication, unspecified whether long term insulin use [facility])     ED Discharge  Orders    None      Portions of this note were generated with dragon dictation software. Dictation errors may occur despite best attempts at proofreading.   Sharman Cheek, MD 01/24/20 1311

## 2020-01-24 NOTE — ED Notes (Signed)
Turned on TV for pt. Provided pt w/ ice.

## 2020-01-24 NOTE — ED Notes (Signed)
Pt very difficult stick. Has been stuck x3 by two RNs. Only green top sent at this time. MD aware.

## 2020-01-24 NOTE — ED Notes (Addendum)
Pt called out for Ibuprofen and metformin. Messaged provider.

## 2020-01-24 NOTE — ED Notes (Signed)
Pt unable to sign ED AMA form r/t blindness.

## 2020-01-24 NOTE — ED Notes (Signed)
Pt cleansed of stool and external catheter applied.

## 2020-01-24 NOTE — Progress Notes (Signed)
Patient admitted to the hospital for non-ST elevation MI.  She has a history of coronary artery disease and is status post stent angioplasty.  Plan of care was discussed with patient again after conversation with cardiology who recommended possible coronary angiogram which she declines at this time and does not want any further medical treatment.  She will want to be discharged back to the skilled nursing facility with hospice Called and spoke to patient's sister and healthcare power of attorney Armina Galloway who is in agreement with her sister and will like hospice to be set up and the patient discharged back to [facility] and spoke to Yolande Jolly, the hospice liaison

## 2020-01-24 NOTE — Progress Notes (Signed)
ANTICOAGULATION CONSULT NOTE - Initial Consult  Pharmacy Consult for Heparin Indication: chest pain/ACS  Allergies  Allergen Reactions  . Levaquin [Levofloxacin In D5w] Anaphylaxis and Swelling  . Iodinated Diagnostic Agents Itching    Severe itching  . Latex Dermatitis    Patient Measurements: Height: 5\' 1"  (154.9 cm) Weight: 77 kg (169 lb 12.1 oz) IBW/kg (Calculated) : 47.8 Heparin Dosing Weight: 65 kg  Vital Signs: Temp: 98.7 F (37.1 C) (06/26 0845) Temp Source: Oral (06/26 0845) BP: 163/81 (06/26 1249) Pulse Rate: 92 (06/26 1249)  Labs: Recent Labs    01/22/20 1906 01/22/20 1906 01/22/20 2044 01/24/20 0846 01/24/20 0859 01/24/20 1102  HGB 11.6*  --   --  10.4*  --   --   HCT 38.0  --   --  32.7*  --   --   PLT 477*  --   --  453*  --   --   CREATININE 0.89  --   --   --  0.80  --   TROPONINIHS 16   < > 17  --  244* 436*   < > = values in this interval not displayed.    Estimated Creatinine Clearance: 72 mL/min (by C-G formula based on SCr of 0.8 mg/dL).   Medical History: Past Medical History:  Diagnosis Date  . Blind   . Diabetes mellitus without complication (HCC)   . Hyperlipemia   . Hypertension   . Stroke [facility])       Assessment: 59 yo female here with chest pain and elevated troponins to start on heparin drip. No oral anticoagulants seen on PTA list.    Goal of Therapy:  Heparin level 0.3-0.7 units/ml Monitor platelets by anticoagulation protocol: Yes   Plan:  Baseline aPTT and INR ordered Give 3900 units bolus x 1 Start heparin infusion at 800 units/hr Check anti-Xa level in 6 hours and daily while on heparin Continue to monitor H&H and platelets - CBC in AM  Pharmacy will continue to follow.   41 01/24/2020,1:08 PM

## 2020-01-24 NOTE — ED Notes (Signed)
Rounding on pt, provided pt w/ cup of ice.

## 2020-01-24 NOTE — ED Notes (Signed)
Heparin drip DC per verbal order Dr Joylene Igo.

## 2020-01-25 ENCOUNTER — Encounter
Admission: EM | Disposition: A | Discharge status: Skilled Nursing Facility | Payer: Self-pay | Source: Home / Self Care | Attending: Internal Medicine

## 2020-01-25 ENCOUNTER — Encounter: Payer: Self-pay | Admitting: Emergency Medicine

## 2020-01-25 ENCOUNTER — Inpatient Hospital Stay
Admission: EM | Admit: 2020-01-25 | Discharge: 2020-01-27 | DRG: 247 | Disposition: A | Discharge status: Skilled Nursing Facility | Payer: Medicare Other | Source: Skilled Nursing Facility | Attending: Internal Medicine | Admitting: Internal Medicine

## 2020-01-25 DIAGNOSIS — Z8679 Personal history of other diseases of the circulatory system: Secondary | ICD-10-CM | POA: Diagnosis not present

## 2020-01-25 DIAGNOSIS — Z66 Do not resuscitate: Secondary | ICD-10-CM | POA: Diagnosis present

## 2020-01-25 DIAGNOSIS — Z881 Allergy status to other antibiotic agents status: Secondary | ICD-10-CM

## 2020-01-25 DIAGNOSIS — Z87892 Personal history of anaphylaxis: Secondary | ICD-10-CM | POA: Diagnosis not present

## 2020-01-25 DIAGNOSIS — E039 Hypothyroidism, unspecified: Secondary | ICD-10-CM | POA: Diagnosis present

## 2020-01-25 DIAGNOSIS — I2510 Atherosclerotic heart disease of native coronary artery without angina pectoris: Secondary | ICD-10-CM | POA: Diagnosis present

## 2020-01-25 DIAGNOSIS — I213 ST elevation (STEMI) myocardial infarction of unspecified site: Secondary | ICD-10-CM | POA: Diagnosis present

## 2020-01-25 DIAGNOSIS — I222 Subsequent non-ST elevation (NSTEMI) myocardial infarction: Secondary | ICD-10-CM | POA: Diagnosis present

## 2020-01-25 DIAGNOSIS — I2119 ST elevation (STEMI) myocardial infarction involving other coronary artery of inferior wall: Secondary | ICD-10-CM | POA: Diagnosis present

## 2020-01-25 DIAGNOSIS — Z955 Presence of coronary angioplasty implant and graft: Secondary | ICD-10-CM

## 2020-01-25 DIAGNOSIS — I428 Other cardiomyopathies: Secondary | ICD-10-CM

## 2020-01-25 DIAGNOSIS — F419 Anxiety disorder, unspecified: Secondary | ICD-10-CM | POA: Diagnosis present

## 2020-01-25 DIAGNOSIS — I10 Essential (primary) hypertension: Secondary | ICD-10-CM | POA: Diagnosis not present

## 2020-01-25 DIAGNOSIS — Z91040 Latex allergy status: Secondary | ICD-10-CM

## 2020-01-25 DIAGNOSIS — I252 Old myocardial infarction: Secondary | ICD-10-CM | POA: Diagnosis not present

## 2020-01-25 DIAGNOSIS — I639 Cerebral infarction, unspecified: Secondary | ICD-10-CM | POA: Diagnosis present

## 2020-01-25 DIAGNOSIS — I2121 ST elevation (STEMI) myocardial infarction involving left circumflex coronary artery: Secondary | ICD-10-CM

## 2020-01-25 DIAGNOSIS — E785 Hyperlipidemia, unspecified: Secondary | ICD-10-CM

## 2020-01-25 DIAGNOSIS — Z91041 Radiographic dye allergy status: Secondary | ICD-10-CM

## 2020-01-25 DIAGNOSIS — H47619 Cortical blindness, unspecified side of brain: Secondary | ICD-10-CM

## 2020-01-25 DIAGNOSIS — I5022 Chronic systolic (congestive) heart failure: Secondary | ICD-10-CM

## 2020-01-25 DIAGNOSIS — Z20822 Contact with and (suspected) exposure to covid-19: Secondary | ICD-10-CM | POA: Diagnosis present

## 2020-01-25 DIAGNOSIS — E871 Hypo-osmolality and hyponatremia: Secondary | ICD-10-CM | POA: Diagnosis not present

## 2020-01-25 DIAGNOSIS — E119 Type 2 diabetes mellitus without complications: Secondary | ICD-10-CM

## 2020-01-25 DIAGNOSIS — Z79899 Other long term (current) drug therapy: Secondary | ICD-10-CM

## 2020-01-25 DIAGNOSIS — R4781 Slurred speech: Secondary | ICD-10-CM | POA: Diagnosis present

## 2020-01-25 DIAGNOSIS — I69354 Hemiplegia and hemiparesis following cerebral infarction affecting left non-dominant side: Secondary | ICD-10-CM | POA: Diagnosis not present

## 2020-01-25 DIAGNOSIS — H547 Unspecified visual loss: Secondary | ICD-10-CM

## 2020-01-25 DIAGNOSIS — I110 Hypertensive heart disease with heart failure: Secondary | ICD-10-CM | POA: Diagnosis present

## 2020-01-25 DIAGNOSIS — I214 Non-ST elevation (NSTEMI) myocardial infarction: Secondary | ICD-10-CM | POA: Diagnosis not present

## 2020-01-25 DIAGNOSIS — Z7982 Long term (current) use of aspirin: Secondary | ICD-10-CM | POA: Diagnosis not present

## 2020-01-25 DIAGNOSIS — I25119 Atherosclerotic heart disease of native coronary artery with unspecified angina pectoris: Secondary | ICD-10-CM | POA: Diagnosis not present

## 2020-01-25 HISTORY — PX: CORONARY/GRAFT ACUTE MI REVASCULARIZATION: CATH118305

## 2020-01-25 HISTORY — PX: LEFT HEART CATH AND CORONARY ANGIOGRAPHY: CATH118249

## 2020-01-25 HISTORY — DX: ST elevation (STEMI) myocardial infarction involving other coronary artery of inferior wall: I21.19

## 2020-01-25 LAB — CBC WITH DIFFERENTIAL/PLATELET
Abs Immature Granulocytes: 0.09 10*3/uL — ABNORMAL HIGH (ref 0.00–0.07)
Basophils Absolute: 0 10*3/uL (ref 0.0–0.1)
Basophils Relative: 0 %
Eosinophils Absolute: 0 10*3/uL (ref 0.0–0.5)
Eosinophils Relative: 0 %
HCT: 30.9 % — ABNORMAL LOW (ref 36.0–46.0)
Hemoglobin: 9.9 g/dL — ABNORMAL LOW (ref 12.0–15.0)
Immature Granulocytes: 1 %
Lymphocytes Relative: 12 %
Lymphs Abs: 1.9 10*3/uL (ref 0.7–4.0)
MCH: 22.4 pg — ABNORMAL LOW (ref 26.0–34.0)
MCHC: 32 g/dL (ref 30.0–36.0)
MCV: 70.1 fL — ABNORMAL LOW (ref 80.0–100.0)
Monocytes Absolute: 1.2 10*3/uL — ABNORMAL HIGH (ref 0.1–1.0)
Monocytes Relative: 8 %
Neutro Abs: 12.6 10*3/uL — ABNORMAL HIGH (ref 1.7–7.7)
Neutrophils Relative %: 79 %
Platelets: 480 10*3/uL — ABNORMAL HIGH (ref 150–400)
RBC: 4.41 MIL/uL (ref 3.87–5.11)
RDW: 16.2 % — ABNORMAL HIGH (ref 11.5–15.5)
WBC: 15.8 10*3/uL — ABNORMAL HIGH (ref 4.0–10.5)
nRBC: 0 % (ref 0.0–0.2)

## 2020-01-25 LAB — BASIC METABOLIC PANEL
Anion gap: 17 — ABNORMAL HIGH (ref 5–15)
BUN: 17 mg/dL (ref 6–20)
CO2: 19 mmol/L — ABNORMAL LOW (ref 22–32)
Calcium: 9.3 mg/dL (ref 8.9–10.3)
Chloride: 98 mmol/L (ref 98–111)
Creatinine, Ser: 1.1 mg/dL — ABNORMAL HIGH (ref 0.44–1.00)
GFR calc Af Amer: >60 mL/min (ref >60)
GFR calc non Af Amer: 55 mL/min — ABNORMAL LOW (ref >60)
Glucose, Bld: 137 mg/dL — ABNORMAL HIGH (ref 70–99)
Potassium: 4.3 mmol/L (ref 3.5–5.1)
Sodium: 134 mmol/L — ABNORMAL LOW (ref 135–145)

## 2020-01-25 LAB — GLUCOSE, CAPILLARY
Glucose-Capillary: 111 mg/dL — ABNORMAL HIGH (ref 70–99)
Glucose-Capillary: 167 mg/dL — ABNORMAL HIGH (ref 70–99)

## 2020-01-25 LAB — TROPONIN I (HIGH SENSITIVITY)
Troponin I (High Sensitivity): 736 ng/L (ref <18)
Troponin I (High Sensitivity): 4886 ng/L (ref <18)

## 2020-01-25 LAB — PROTIME-INR
INR: 1 (ref 0.8–1.2)
Prothrombin Time: 12.9 seconds (ref 11.4–15.2)

## 2020-01-25 LAB — MRSA PCR SCREENING: MRSA by PCR: POSITIVE — AB

## 2020-01-25 SURGERY — CORONARY/GRAFT ACUTE MI REVASCULARIZATION
Anesthesia: Moderate Sedation

## 2020-01-25 MED ORDER — LIDOCAINE HCL (PF) 1 % IJ SOLN
INTRAMUSCULAR | Status: AC
  Filled 2020-01-25: qty 30

## 2020-01-25 MED ORDER — NITROGLYCERIN 0.4 MG SL SUBL: 0.4000 mg | SUBLINGUAL_TABLET | SUBLINGUAL | Status: DC | PRN

## 2020-01-25 MED ORDER — SODIUM CHLORIDE 0.9% FLUSH: 3.0000 mL | INTRAVENOUS | Status: DC | PRN

## 2020-01-25 MED ORDER — ONDANSETRON HCL 4 MG PO TABS: 4.0000 mg | ORAL_TABLET | Freq: Three times a day (TID) | ORAL | Status: DC | PRN

## 2020-01-25 MED ORDER — DIPHENHYDRAMINE HCL 50 MG/ML IJ SOLN
INTRAMUSCULAR | Status: DC | PRN
  Administered 2020-01-25: 25 mg via INTRAVENOUS

## 2020-01-25 MED ORDER — SODIUM CHLORIDE 0.9 % IV BOLUS
500.0000 mL | Freq: Once | INTRAVENOUS | Status: AC
  Administered 2020-01-25: 500 mL via INTRAVENOUS

## 2020-01-25 MED ORDER — NITROGLYCERIN 1 MG/10 ML FOR IR/CATH LAB
INTRA_ARTERIAL | Status: DC | PRN
  Administered 2020-01-25: 200 ug via INTRACORONARY

## 2020-01-25 MED ORDER — AZELASTINE HCL 0.1 % NA SOLN
2.0000 | Freq: Two times a day (BID) | NASAL | Status: DC
  Filled 2020-01-25: qty 30

## 2020-01-25 MED ORDER — METHYLPREDNISOLONE SODIUM SUCC 125 MG IJ SOLR
INTRAMUSCULAR | Status: DC | PRN
  Administered 2020-01-25: 125 mg via INTRAVENOUS

## 2020-01-25 MED ORDER — BUSPIRONE HCL 10 MG PO TABS
10.0000 mg | ORAL_TABLET | Freq: Two times a day (BID) | ORAL | Status: DC
  Filled 2020-01-25 (×6): qty 1

## 2020-01-25 MED ORDER — ONDANSETRON HCL 4 MG/2ML IJ SOLN
INTRAMUSCULAR | Status: AC
  Filled 2020-01-25: qty 2

## 2020-01-25 MED ORDER — TICAGRELOR 90 MG PO TABS
ORAL_TABLET | ORAL | Status: AC
  Filled 2020-01-25: qty 2

## 2020-01-25 MED ORDER — OLOPATADINE HCL 0.1 % OP SOLN
1.0000 [drp] | Freq: Two times a day (BID) | OPHTHALMIC | Status: DC
  Administered 2020-01-26 – 2020-01-27 (×2): 1 [drp] via OPHTHALMIC
  Filled 2020-01-25: qty 5

## 2020-01-25 MED ORDER — MIDAZOLAM HCL 2 MG/2ML IJ SOLN
INTRAMUSCULAR | Status: AC
  Filled 2020-01-25: qty 2

## 2020-01-25 MED ORDER — HEPARIN SODIUM (PORCINE) 1000 UNIT/ML IJ SOLN
INTRAMUSCULAR | Status: DC | PRN
  Administered 2020-01-25: 7000 [IU] via INTRAVENOUS
  Administered 2020-01-25: 3000 [IU] via INTRAVENOUS

## 2020-01-25 MED ORDER — ATORVASTATIN CALCIUM 20 MG PO TABS
80.0000 mg | ORAL_TABLET | Freq: Every evening | ORAL | Status: DC
  Filled 2020-01-25: qty 4

## 2020-01-25 MED ORDER — HEPARIN SODIUM (PORCINE) 1000 UNIT/ML IJ SOLN
INTRAMUSCULAR | Status: AC
  Filled 2020-01-25: qty 1

## 2020-01-25 MED ORDER — VERAPAMIL HCL 2.5 MG/ML IV SOLN
INTRAVENOUS | Status: AC
  Filled 2020-01-25: qty 2

## 2020-01-25 MED ORDER — MIRTAZAPINE 15 MG PO TABS: 22.5000 mg | ORAL_TABLET | Freq: Every day | ORAL | Status: DC

## 2020-01-25 MED ORDER — LIDOCAINE HCL (PF) 1 % IJ SOLN
INTRAMUSCULAR | Status: DC | PRN
  Administered 2020-01-25: 2 mL via SUBCUTANEOUS

## 2020-01-25 MED ORDER — METHYLPREDNISOLONE SODIUM SUCC 125 MG IJ SOLR
INTRAMUSCULAR | Status: AC
  Filled 2020-01-25: qty 2

## 2020-01-25 MED ORDER — RISPERIDONE 1 MG PO TABS
2.0000 mg | ORAL_TABLET | Freq: Every day | ORAL | Status: DC
  Filled 2020-01-25 (×3): qty 2

## 2020-01-25 MED ORDER — IOHEXOL 300 MG/ML  SOLN
INTRAMUSCULAR | Status: DC | PRN
  Administered 2020-01-25: 100 mL

## 2020-01-25 MED ORDER — ONDANSETRON HCL 4 MG/2ML IJ SOLN: 4.0000 mg | Freq: Four times a day (QID) | INTRAMUSCULAR | Status: DC | PRN

## 2020-01-25 MED ORDER — VITAMIN D (ERGOCALCIFEROL) 1.25 MG (50000 UNIT) PO CAPS: 50000.0000 [IU] | ORAL_CAPSULE | ORAL | Status: DC

## 2020-01-25 MED ORDER — ASPIRIN EC 81 MG PO TBEC
81.0000 mg | DELAYED_RELEASE_TABLET | Freq: Every day | ORAL | Status: DC
  Administered 2020-01-26 – 2020-01-27 (×2): 81 mg via ORAL
  Filled 2020-01-25 (×2): qty 1

## 2020-01-25 MED ORDER — TICAGRELOR 90 MG PO TABS
90.0000 mg | ORAL_TABLET | Freq: Two times a day (BID) | ORAL | Status: DC
  Administered 2020-01-26 – 2020-01-27 (×3): 90 mg via ORAL
  Filled 2020-01-25 (×3): qty 1

## 2020-01-25 MED ORDER — BISACODYL 10 MG RE SUPP: 10.0000 mg | RECTAL | Status: DC | PRN

## 2020-01-25 MED ORDER — ENOXAPARIN SODIUM 40 MG/0.4ML ~~LOC~~ SOLN
40.0000 mg | SUBCUTANEOUS | Status: DC
  Filled 2020-01-25: qty 0.4

## 2020-01-25 MED ORDER — VITAMIN B-12 1000 MCG PO TABS
1000.0000 ug | ORAL_TABLET | Freq: Every day | ORAL | Status: DC
  Administered 2020-01-26 – 2020-01-27 (×2): 1000 ug via ORAL
  Filled 2020-01-25 (×2): qty 1

## 2020-01-25 MED ORDER — SODIUM CHLORIDE 0.9 % IV SOLN: 250.0000 mL | INTRAVENOUS | Status: DC | PRN

## 2020-01-25 MED ORDER — HEPARIN SODIUM (PORCINE) 5000 UNIT/ML IJ SOLN: 4000.0000 [IU] | Freq: Once | INTRAMUSCULAR | Status: DC

## 2020-01-25 MED ORDER — MAGNESIUM CITRATE PO SOLN
0.5000 | ORAL | Status: DC
  Filled 2020-01-25: qty 296

## 2020-01-25 MED ORDER — OXYCODONE HCL 5 MG PO TABS
5.0000 mg | ORAL_TABLET | Freq: Four times a day (QID) | ORAL | Status: DC | PRN
  Administered 2020-01-26: 5 mg via ORAL
  Filled 2020-01-25 (×2): qty 1

## 2020-01-25 MED ORDER — TIZANIDINE HCL 2 MG PO TABS
2.0000 mg | ORAL_TABLET | Freq: Three times a day (TID) | ORAL | Status: DC | PRN
  Filled 2020-01-25: qty 1

## 2020-01-25 MED ORDER — VERAPAMIL HCL 2.5 MG/ML IV SOLN
INTRAVENOUS | Status: DC | PRN
  Administered 2020-01-25: 2.5 mg via INTRA_ARTERIAL

## 2020-01-25 MED ORDER — METOPROLOL TARTRATE 50 MG PO TABS
100.0000 mg | ORAL_TABLET | Freq: Two times a day (BID) | ORAL | Status: DC
  Administered 2020-01-25 – 2020-01-27 (×2): 100 mg via ORAL
  Filled 2020-01-25 (×3): qty 2

## 2020-01-25 MED ORDER — FLUTICASONE PROPIONATE 50 MCG/ACT NA SUSP
2.0000 | Freq: Two times a day (BID) | NASAL | Status: DC
  Administered 2020-01-26 – 2020-01-27 (×4): 2 via NASAL
  Filled 2020-01-25: qty 16

## 2020-01-25 MED ORDER — FENTANYL CITRATE (PF) 100 MCG/2ML IJ SOLN: 100.0000 ug | Freq: Once | INTRAMUSCULAR | Status: AC

## 2020-01-25 MED ORDER — MIDAZOLAM HCL 2 MG/2ML IJ SOLN
INTRAMUSCULAR | Status: DC | PRN
  Administered 2020-01-25: 2 mg via INTRAVENOUS

## 2020-01-25 MED ORDER — TICAGRELOR 90 MG PO TABS
ORAL_TABLET | ORAL | Status: DC | PRN
  Administered 2020-01-25: 180 mg via ORAL

## 2020-01-25 MED ORDER — SODIUM CHLORIDE 0.9% FLUSH
3.0000 mL | Freq: Two times a day (BID) | INTRAVENOUS | Status: DC
  Administered 2020-01-25 – 2020-01-27 (×4): 3 mL via INTRAVENOUS

## 2020-01-25 MED ORDER — HEPARIN (PORCINE) 25000 UT/250ML-% IV SOLN: 800.0000 [IU]/h | INTRAVENOUS | Status: DC

## 2020-01-25 MED ORDER — DIPHENHYDRAMINE HCL 50 MG/ML IJ SOLN
INTRAMUSCULAR | Status: AC
  Filled 2020-01-25: qty 1

## 2020-01-25 MED ORDER — HEPARIN (PORCINE) 25000 UT/250ML-% IV SOLN
INTRAVENOUS | Status: AC
  Filled 2020-01-25: qty 250

## 2020-01-25 MED ORDER — HEPARIN BOLUS VIA INFUSION
3900.0000 [IU] | Freq: Once | INTRAVENOUS | Status: DC
  Filled 2020-01-25: qty 3900

## 2020-01-25 MED ORDER — PANTOPRAZOLE SODIUM 40 MG PO TBEC
40.0000 mg | DELAYED_RELEASE_TABLET | Freq: Every day | ORAL | Status: DC
  Administered 2020-01-26 – 2020-01-27 (×2): 40 mg via ORAL
  Filled 2020-01-25 (×2): qty 1

## 2020-01-25 MED ORDER — CLONAZEPAM 0.5 MG PO TABS
0.5000 mg | ORAL_TABLET | Freq: Three times a day (TID) | ORAL | Status: DC | PRN
  Administered 2020-01-26 (×3): 0.5 mg via ORAL
  Filled 2020-01-25 (×3): qty 1

## 2020-01-25 MED ORDER — ATROPINE SULFATE 1 MG/10ML IJ SOSY
PREFILLED_SYRINGE | INTRAMUSCULAR | Status: AC
  Filled 2020-01-25: qty 10

## 2020-01-25 MED ORDER — HEPARIN (PORCINE) IN NACL 1000-0.9 UT/500ML-% IV SOLN
INTRAVENOUS | Status: DC | PRN
  Administered 2020-01-25: 500 mL

## 2020-01-25 MED ORDER — SODIUM CHLORIDE 0.9 % IV SOLN: INTRAVENOUS | Status: AC

## 2020-01-25 MED ORDER — FENTANYL CITRATE (PF) 100 MCG/2ML IJ SOLN
INTRAMUSCULAR | Status: AC
  Administered 2020-01-25: 100 ug via INTRAVENOUS
  Filled 2020-01-25: qty 2

## 2020-01-25 MED ORDER — INSULIN ASPART 100 UNIT/ML ~~LOC~~ SOLN
0.0000 [IU] | Freq: Three times a day (TID) | SUBCUTANEOUS | Status: DC
  Administered 2020-01-25: 3 [IU] via SUBCUTANEOUS
  Filled 2020-01-25 (×3): qty 1

## 2020-01-25 MED ORDER — NITROGLYCERIN 1 MG/10 ML FOR IR/CATH LAB
INTRA_ARTERIAL | Status: AC
  Filled 2020-01-25: qty 10

## 2020-01-25 MED ORDER — FENTANYL CITRATE (PF) 100 MCG/2ML IJ SOLN
INTRAMUSCULAR | Status: AC
  Filled 2020-01-25: qty 2

## 2020-01-25 MED ORDER — ACETAMINOPHEN 325 MG PO TABS: 650.0000 mg | ORAL_TABLET | ORAL | Status: DC | PRN

## 2020-01-25 MED ORDER — PSEUDOEPHEDRINE HCL 30 MG PO TABS
30.0000 mg | ORAL_TABLET | Freq: Four times a day (QID) | ORAL | Status: DC | PRN
  Filled 2020-01-25: qty 1

## 2020-01-25 MED ORDER — ONDANSETRON HCL 4 MG/2ML IJ SOLN
INTRAMUSCULAR | Status: DC | PRN
  Administered 2020-01-25: 4 mg via INTRAVENOUS

## 2020-01-25 MED ORDER — LEVOTHYROXINE SODIUM 25 MCG PO TABS
25.0000 ug | ORAL_TABLET | Freq: Every day | ORAL | Status: DC
  Administered 2020-01-27: 25 ug via ORAL
  Filled 2020-01-25: qty 1

## 2020-01-25 SURGICAL SUPPLY — 16 items
BALLN MINITREK RX 2.0X12 (BALLOONS) ×2
BALLN ~~LOC~~ TREK RX 2.5X20 (BALLOONS) ×2
BALLOON MINITREK RX 2.0X12 (BALLOONS) ×1 IMPLANT
BALLOON ~~LOC~~ TREK RX 2.5X20 (BALLOONS) ×1 IMPLANT
CATH INFINITI JR4 5F (CATHETERS) ×2 IMPLANT
CATH LAUNCHER 6FR EBU3.5 (CATHETERS) ×2 IMPLANT
DEVICE INFLAT 30 PLUS (MISCELLANEOUS) ×2 IMPLANT
DEVICE RAD TR BAND REGULAR (VASCULAR PRODUCTS) ×2 IMPLANT
GLIDESHEATH SLEND SS 6F .021 (SHEATH) ×2 IMPLANT
GUIDEWIRE INQWIRE 1.5J.035X260 (WIRE) ×1 IMPLANT
INQWIRE 1.5J .035X260CM (WIRE) ×2
KIT MANI 3VAL PERCEP (MISCELLANEOUS) ×2 IMPLANT
PACK CARDIAC CATH (CUSTOM PROCEDURE TRAY) ×2 IMPLANT
STENT RESOLUTE ONYX 2.25X26 (Permanent Stent) ×2 IMPLANT
WIRE HITORQ VERSACORE ST 145CM (WIRE) ×2 IMPLANT
WIRE RUNTHROUGH .014X180CM (WIRE) ×2 IMPLANT

## 2020-01-25 NOTE — Discharge Summary (Signed)
Physician Discharge Summary  Victoria Medina YSA:[id number] DOB: [birth year] DOA: 01/24/2020  PCP: Karie Schwalbe, MD  Admit date: 01/24/2020 Discharge date: 01/24/20  Admitted From: SNF  Disposition:  [place]    Recommendations for Outpatient Follow-up:  1. Palliative Care follow up 2. Please obtain BMP/CBC in one week 3. Please follow up on the following pending results:  Home Health: No  Equipment/Devices: None Discharge Condition:Signed out AMA CODE STATUS:DNR Diet recommendation: Heart Healthy / Carb Modified   Brief/Interim Summary:  Victoria Medina is a 59 y.o. female with medical history significant for diabetes mellitus, hypertension, CVA with left-sided hemiplegia, coronary artery disease status post stent angioplasty and cortical blindness who presented to the emergency room for evaluation of midsternal chest pain with radiation to both arms and back which started about 5 days prior to her presentation and has been constant since then.  She described the pain as a sharp pain and rated it 10 x 10 in intensity at its worst and worse with movement.  Chest pain was associated with nausea and vomiting but she denied having any shortness of breath or diaphoresis. Patient was seen in the emergency room a couple of days ago and at that time her chest pain was thought to be musculoskeletal and she was discharged home after she had 2 - troponin tests Her troponin today is elevated 244 >> 436 Twelve-lead EKG shows sinus rhythm with diffuse repolarization changes She continues to have chest pain and received IV morphine in the ER but is requesting for ibuprofen since that helps her pain better. Patient is a DO NOT RESUSCITATE Patient was started on a heparin drip and cardiology was consulted.  Cardiologist was of the opinion the patient may need a coronary angiogram if chest pain persisted.  This was discussed with patient who declined a coronary angiogram or any further aggressive measures.   She signed out AGAINST MEDICAL ADVICE and went back to [facility] facility. Palliative care/hospice was consulted and patient will be seen in the facility   Discharge Diagnoses:  Principal Problem:   NSTEMI (non-ST elevated myocardial infarction) [facility]) Active Problems:   Stroke [facility])   Diabetes mellitus without complication (HCC)   Hypertension   Blind   Hypothyroidism (acquired)   Anxiety    Discharge Instructions   Allergies as of 01/24/2020      Reactions   Levaquin [levofloxacin In D5w] Anaphylaxis, Swelling   Iodinated Diagnostic Agents Itching   Severe itching   Latex Dermatitis      Medication List    ASK your doctor about these medications   aspirin EC 81 MG tablet Take 81 mg by mouth daily. Swallow whole.   atorvastatin 20 MG tablet Commonly known as: LIPITOR Take 20 mg by mouth every evening.   Azelastine HCl 0.15 % Soln Place 2 sprays into both nostrils 2 (two) times daily.   bisacodyl 10 MG suppository Commonly known as: DULCOLAX Place 10 mg rectally as needed for moderate constipation.   busPIRone 10 MG tablet Commonly known as: BUSPAR Take 10 mg by mouth 2 (two) times daily.   clonazePAM 2 MG tablet Commonly known as: KLONOPIN Take 0.5 mg by mouth every 8 (eight) hours as needed for anxiety. *limit 3 doses per 24 hour*   diazepam 10 MG tablet Commonly known as: VALIUM Take 10 mg by mouth 2 (two) times daily.   fluticasone 50 MCG/ACT nasal spray Commonly known as: FLONASE Place 2 sprays into both nostrils 2 (two) times daily.  ibuprofen 200 MG tablet Commonly known as: ADVIL Take 400 mg by mouth every 8 (eight) hours as needed for mild pain.   lansoprazole 15 MG capsule Commonly known as: PREVACID Take 15 mg by mouth daily at 12 noon.   levothyroxine 25 MCG tablet Commonly known as: SYNTHROID Take 25 mcg by mouth daily before breakfast.   magnesium citrate Soln Take 0.5 Bottles by mouth once a week.   metFORMIN 500 MG  tablet Commonly known as: GLUCOPHAGE Take 500 mg by mouth 2 (two) times daily.   metoprolol tartrate 100 MG tablet Commonly known as: LOPRESSOR Take 100 mg by mouth 2 (two) times daily.   mirtazapine 15 MG tablet Commonly known as: REMERON Take 22.5 mg by mouth at bedtime.   nitroGLYCERIN 0.4 MG SL tablet Commonly known as: NITROSTAT Place 0.4 mg under the tongue every 5 (five) minutes x 3 doses as needed for chest pain.   Olopatadine HCl 0.2 % Soln Place 1 drop into both eyes daily at 6 (six) AM.   ondansetron 4 MG tablet Commonly known as: ZOFRAN Take 4 mg by mouth every 8 (eight) hours as needed for nausea or vomiting.   oxyCODONE 5 MG immediate release tablet Commonly known as: Roxicodone Take 1 tablet (5 mg total) by mouth every 6 (six) hours as needed for up to 3 days.   pseudoephedrine 30 MG tablet Commonly known as: SUDAFED Take 30 mg by mouth every 6 (six) hours as needed for congestion.   risperiDONE 2 MG tablet Commonly known as: RISPERDAL Take 2 mg by mouth at bedtime.   tiZANidine 2 MG tablet Commonly known as: ZANAFLEX Take 2 mg by mouth 3 (three) times daily as needed for muscle spasms.   vitamin B-12 1000 MCG tablet Commonly known as: CYANOCOBALAMIN Take 1,000 mcg by mouth daily.   Vitamin D (Ergocalciferol) 1.25 MG (50000 UNIT) Caps capsule Commonly known as: DRISDOL Take 50,000 Units by mouth every 7 (seven) days.       Follow-up Information    Schedule an appointment as soon as possible for a visit  with Venia Carbon, MD.   Specialties: Internal Medicine, Pediatrics Why: For recheck Contact information: [address] [place] [address] [phone]              Allergies  Allergen Reactions  . Levaquin [Levofloxacin In D5w] Anaphylaxis and Swelling  . Iodinated Diagnostic Agents Itching    Severe itching  . Latex Dermatitis    Consultations:  Dr Acie Fredrickson (Cardiology)   Procedures/Studies: DG Chest 2  View  Result Date: 01/22/2020 CLINICAL DATA:  Chest pain EXAM: CHEST - 2 VIEW COMPARISON:  12/11/2015 FINDINGS: The heart size and mediastinal contours are within normal limits. Aortic atherosclerosis. Both lungs are clear. The visualized skeletal structures are unremarkable. IMPRESSION: No active cardiopulmonary disease. Electronically Signed   By: Donavan Foil M.D.   On: 01/22/2020 19:34   CT Angio Chest PE W and/or Wo Contrast  Result Date: 01/24/2020 CLINICAL DATA:  59 year old with chest wall pain. Concern for pulmonary embolism. EXAM: CT ANGIOGRAPHY CHEST TECHNIQUE: Multidetector CT imaging through the chest using the standard protocol during bolus administration of intravenous contrast. Multiplanar reconstructed images and MIPs were obtained and reviewed to evaluate the vascular anatomy. CONTRAST:  60mL OMNIPAQUE IOHEXOL 350 MG/ML SOLN COMPARISON:  Chest radiograph 01/24/2020 FINDINGS: CTA CHEST FINDINGS Cardiovascular: Satisfactory opacification of the pulmonary arteries to the segmental level. No evidence of pulmonary embolism. Normal heart size. No pericardial effusion.  Coronary artery calcifications. Normal caliber of the thoracic aorta with atherosclerotic calcifications. Mediastinum/Nodes: Small axillary lymph nodes. No significant mediastinal or hilar lymph node enlargement. Lungs/Pleura: Trachea and mainstem bronchi are patent. No large pleural effusions. Lungs are clear without airspace disease or consolidation. Upper abdomen: High-density structure in the gallbladder that measures roughly 1.1 cm and suggestive for gallstone. No acute abnormality in the upper abdomen. Musculoskeletal: No acute bone abnormality. Review of the MIP images confirms the above findings. IMPRESSION: 1. No acute chest abnormality.  No evidence for pulmonary embolism. 2. Aortic Atherosclerosis (ICD10-I70.0). Coronary artery calcifications. 3. Cholelithiasis. Electronically Signed   By: Richarda Overlie M.D.   On: 01/24/2020  12:10   DG Chest Portable 1 View  Result Date: 01/24/2020 CLINICAL DATA:  Chest pain for 5 days. EXAM: PORTABLE CHEST 1 VIEW COMPARISON:  01/22/2020 FINDINGS: Patient is rotated to the left. Low lung volumes are seen. Heart size is within normal limits. Aortic atherosclerosis noted. Both lungs are clear. IMPRESSION: Low lung volumes. No active disease. Electronically Signed   By: Danae Orleans M.D.   On: 01/24/2020 09:55    (Echo, Carotid, EGD, Colonoscopy, ERCP)    Subjective:   Discharge Exam: Vitals:   01/24/20 1448 01/24/20 1842  BP: (!) 183/88 (!) 183/156  Pulse: 89 98  Resp: 17 17  Temp:    SpO2: 98% 98%   Vitals:   01/24/20 1100 01/24/20 1249 01/24/20 1448 01/24/20 1842  BP: (!) 131/92 (!) 163/81 (!) 183/88 (!) 183/156  Pulse: 79 92 89 98  Resp: 18 16 17 17   Temp:      TempSrc:      SpO2: 95% 99% 98% 98%  Weight:      Height:        General: Pt is alert, awake, not in acute distress Cardiovascular: RRR, S1/S2 +, no rubs, no gallops Respiratory: CTA bilaterally, no wheezing, no rhonchi Abdominal: Soft, NT, ND, bowel sounds + Extremities: no edema, no cyanosis    The results of significant diagnostics from this hospitalization (including imaging, microbiology, ancillary and laboratory) are listed below for reference.     Microbiology: Recent Results (from the past 240 hour(s))  SARS Coronavirus 2 by RT PCR (hospital order, performed in [facility] hospital lab) Nasopharyngeal Nasopharyngeal Swab     Status: None   Collection Time: 01/24/20 11:02 AM   Specimen: Nasopharyngeal Swab  Result Value Ref Range Status   SARS Coronavirus 2 NEGATIVE NEGATIVE Final    Comment: (NOTE) SARS-CoV-2 target nucleic acids are NOT DETECTED.  The SARS-CoV-2 RNA is generally detectable in upper and lower respiratory specimens during the acute phase of infection. The lowest concentration of SARS-CoV-2 viral copies this assay can detect is 250 copies / mL. A negative result  does not preclude SARS-CoV-2 infection and should not be used as the sole basis for treatment or other patient management decisions.  A negative result may occur with improper specimen collection / handling, submission of specimen other than nasopharyngeal swab, presence of viral mutation(s) within the areas targeted by this assay, and inadequate number of viral copies (<250 copies / mL). A negative result must be combined with clinical observations, patient history, and epidemiological information.  Fact Sheet for Patients:   01/26/20  Fact Sheet for Healthcare Providers: BoilerBrush.com.cy  This test is not yet approved or  cleared by the https://pope.com/ FDA and has been authorized for detection and/or diagnosis of SARS-CoV-2 by FDA under an Emergency Use Authorization (EUA).  This EUA  will remain in effect (meaning this test can be used) for the duration of the COVID-19 declaration under Section 564(b)(1) of the Act, 21 U.S.C. section 360bbb-3(b)(1), unless the authorization is terminated or revoked sooner.  Performed at [facility], [address] [place], [place] [address]      Labs: BNP (last 3 results) Recent Labs    01/22/20 1906  BNP 73.1   Basic Metabolic Panel: Recent Labs  Lab 01/22/20 1906 01/24/20 0859  NA 138 134*  K 4.2 4.4  CL 98 100  CO2 25 22  GLUCOSE 161* 114*  BUN 14 13  CREATININE 0.89 0.80  CALCIUM 9.8 8.9   Liver Function Tests: Recent Labs  Lab 01/22/20 1906  AST 17  ALT 12  ALKPHOS 86  BILITOT 0.6  PROT 8.7*  ALBUMIN 4.6   Recent Labs  Lab 01/22/20 1906  LIPASE 46   No results for input(s): AMMONIA in the last 168 hours. CBC: Recent Labs  Lab 01/22/20 1906 01/24/20 0846  WBC 12.9* 14.5*  HGB 11.6* 10.4*  HCT 38.0 32.7*  MCV 72.5* 69.9*  PLT 477* 453*   Cardiac Enzymes: No results for input(s): CKTOTAL, CKMB, CKMBINDEX, TROPONINI in the last 168  hours. BNP: Invalid input(s): POCBNP CBG: No results for input(s): GLUCAP in the last 168 hours. D-Dimer No results for input(s): DDIMER in the last 72 hours. Hgb A1c No results for input(s): HGBA1C in the last 72 hours. Lipid Profile No results for input(s): CHOL, HDL, LDLCALC, TRIG, CHOLHDL, LDLDIRECT in the last 72 hours. Thyroid function studies No results for input(s): TSH, T4TOTAL, T3FREE, THYROIDAB in the last 72 hours.  Invalid input(s): FREET3 Anemia work up No results for input(s): VITAMINB12, FOLATE, FERRITIN, TIBC, IRON, RETICCTPCT in the last 72 hours. Urinalysis    Component Value Date/Time   COLORURINE YELLOW (A) 05/28/2016 1545   APPEARANCEUR CLEAR (A) 05/28/2016 1545   APPEARANCEUR CLOUDY 08/05/2014 1906   LABSPEC 1.006 05/28/2016 1545   LABSPEC 1.026 08/05/2014 1906   PHURINE 7.0 05/28/2016 1545   GLUCOSEU NEGATIVE 05/28/2016 1545   GLUCOSEU NEGATIVE 08/05/2014 1906   HGBUR NEGATIVE 05/28/2016 1545   BILIRUBINUR NEGATIVE 05/28/2016 1545   BILIRUBINUR NEGATIVE 08/05/2014 1906   KETONESUR NEGATIVE 05/28/2016 1545   PROTEINUR NEGATIVE 05/28/2016 1545   NITRITE NEGATIVE 05/28/2016 1545   LEUKOCYTESUR NEGATIVE 05/28/2016 1545   LEUKOCYTESUR 2+ 08/05/2014 1906   Sepsis Labs Invalid input(s): PROCALCITONIN,  WBC,  LACTICIDVEN Microbiology Recent Results (from the past 240 hour(s))  SARS Coronavirus 2 by RT PCR (hospital order, performed in [facility] hospital lab) Nasopharyngeal Nasopharyngeal Swab     Status: None   Collection Time: 01/24/20 11:02 AM   Specimen: Nasopharyngeal Swab  Result Value Ref Range Status   SARS Coronavirus 2 NEGATIVE NEGATIVE Final    Comment: (NOTE) SARS-CoV-2 target nucleic acids are NOT DETECTED.  The SARS-CoV-2 RNA is generally detectable in upper and lower respiratory specimens during the acute phase of infection. The lowest concentration of SARS-CoV-2 viral copies this assay can detect is 250 copies / mL. A negative  result does not preclude SARS-CoV-2 infection and should not be used as the sole basis for treatment or other patient management decisions.  A negative result may occur with improper specimen collection / handling, submission of specimen other than nasopharyngeal swab, presence of viral mutation(s) within the areas targeted by this assay, and inadequate number of viral copies (<250 copies / mL). A negative result must be combined with clinical observations,  patient history, and epidemiological information.  Fact Sheet for Patients:   BoilerBrush.com.cy  Fact Sheet for Healthcare Providers: https://pope.com/  This test is not yet approved or  cleared by the [place] FDA and has been authorized for detection and/or diagnosis of SARS-CoV-2 by FDA under an Emergency Use Authorization (EUA).  This EUA will remain in effect (meaning this test can be used) for the duration of the COVID-19 declaration under Section 564(b)(1) of the Act, 21 U.S.C. section 360bbb-3(b)(1), unless the authorization is terminated or revoked sooner.  Performed at [facility], [address] [place], [place] [address]      Time coordinating discharge: Over 30 minutes  SIGNED:   Lucile Shutters, MD  [facility] 01/25/2020, 9:34 AM Pager   If 7PM-7AM, please contact night-coverage [email or web address] Password [email or web address]

## 2020-01-25 NOTE — ED Triage Notes (Signed)
Pt to ED by EMS with c/o of chest pain. Pt seen yesterday for NSTEMI and left AMA. Pt returned due to continued c/o chest pain.

## 2020-01-25 NOTE — Progress Notes (Signed)
Pt refusing to take Lipitor, states that Lipitor makes her have heart attacks. Pt educated on medication and what it does to help the heart. Pt still refuses to take medication. Documented on MAR.

## 2020-01-25 NOTE — Progress Notes (Signed)
Civil engineer, contracting Documentation  Liaison received referral for pt to return to [facility] under either Palliative or hospice care. Case discussed with [facility] MD who agreed that palliative care may be more appropriate for pt.   Writer called pt's sister, Tawni Carnes. In depth discussion had on hospice vs palliative and sister agrees to have palliative evaluate pt once pt is discharged to [facility].   Please reach out to [facility] with any questions and thank you for the referral.   Trena Platt, RN  [facility] Liaison  [phone]

## 2020-01-25 NOTE — H&P (Signed)
History and Physical    [name] NOB:[id number] DOB: [birth year] DOA: 01/25/2020  PCP: Karie Schwalbe, MD   Patient coming from: [place]  I have personally briefly reviewed patient's old medical records in [facility]  Chief Complaint: Chest pain  HPI: [place] Medina is a 59 y.o. 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  She was seen in the emergency room yesterday and admitted for an acute non-ST elevation.  Coronary angiogram was offered which she declined and opted to be discharged home with palliative care.  She returns to the emergency room for evaluation of chest pain mostly midsternal with radiation to the back.  She describes the pain is severe and rates it a 10 x 10 in intensity at its worst.  She received nitroglycerin and aspirin without any improvement in her pain. Chest pain is associated with nausea and vomiting but she denies having any shortness of breath or diaphoresis. Patient was noted to have ST elevation in inferior lateral leads with reciprocal ST depression and T wave inversion in V2. Code STEMI was called and patient was taken emergently to the Cath Lab where she was found to have an occluded mid left circumflex and is status post stent angioplasty.  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  Patient signed out AGAINST MEDICAL ADVICE and returns to the ER this morning with worsening chest pain.  Twelve-lead EKG shows ST elevation in the  inferior lateral leads.  Patient was taken emergently to the cardiac Cath Lab.  Review of Systems: As per HPI otherwise 10 point review of systems negative.    Past Medical History:  Diagnosis Date  . Blind   . Diabetes mellitus without complication (HCC)   . Hyperlipemia   . Hypertension   . Stroke [facility])     Past Surgical History:  Procedure Laterality Date  . ABDOMINAL HYSTERECTOMY    . BACK SURGERY    . FRACTURE SURGERY    . SKIN GRAFT Right      reports that she has never smoked. She has never used smokeless tobacco. She reports that she does not drink alcohol and does not use drugs.  Allergies  Allergen Reactions  . Levaquin [Levofloxacin In D5w] Anaphylaxis and Swelling  . Iodinated Diagnostic Agents Itching    Severe itching  . Latex Dermatitis    History reviewed. No pertinent family history.   Prior to Admission medications   Medication Sig Start Date End Date Taking? Authorizing Provider  aspirin EC 81 MG tablet Take 81 mg by mouth daily. Swallow whole.   Yes [provider]  Azelastine HCl 0.15 % SOLN Place 2 sprays into both nostrils 2 (two) times daily. 01/14/20  Yes [provider]  clonazePAM (KLONOPIN) 2 MG tablet Take 0.5 mg by mouth every 8 (eight) hours as needed for anxiety. *limit 3 doses per 24 hour*    Yes [provider]  diazepam (VALIUM) 10 MG tablet Take 10 mg by mouth 2 (two) times daily.   Yes [provider]  lansoprazole (PREVACID) 15 MG capsule Take 15 mg by mouth daily at 12 noon.   Yes [provider]  levothyroxine (SYNTHROID) 25 MCG tablet Take 25 mcg by mouth daily before breakfast.   Yes [provider]  metFORMIN (GLUCOPHAGE) 500 MG tablet Take 500 mg by mouth 2 (two) times daily.   Yes [provider]  nitroGLYCERIN (NITROSTAT) 0.4 MG SL tablet Place 0.4 mg under the tongue every 5 (five) minutes x 3 doses as needed for chest pain.    Yes [provider]    Olopatadine HCl 0.2 % SOLN Place 1 drop into both eyes daily at 6 (six) AM.   Yes [provider]  tiZANidine (ZANAFLEX) 2 MG tablet Take 2 mg by mouth 3 (three) times daily as needed for muscle spasms.   Yes [provider]  vitamin B-12 (CYANOCOBALAMIN) 1000 MCG tablet Take 1,000 mcg by mouth daily.   Yes [provider]  Vitamin D, Ergocalciferol, (DRISDOL) 50000 units CAPS capsule Take 50,000 Units by mouth every 7 (seven) days.   Yes [provider]  atorvastatin (LIPITOR) 20 MG tablet Take 20 mg by mouth every evening.  Patient not taking: Reported on 01/22/2020    [provider]  bisacodyl (DULCOLAX) 10 MG suppository Place 10 mg rectally as needed for moderate constipation.    [provider]  busPIRone (BUSPAR) 10 MG tablet Take 10 mg by mouth 2 (two) times daily. Patient not taking: Reported on 01/22/2020    [provider]  fluticasone (FLONASE) 50 MCG/ACT nasal spray Place 2 sprays into both nostrils 2 (two) times daily.  Patient not taking: Reported on 01/22/2020    [provider]  ibuprofen (ADVIL) 200 MG tablet Take 400 mg by mouth every 8 (eight) hours as needed for mild pain.    [provider]  magnesium citrate SOLN Take 0.5 Bottles by mouth once a week.    [provider]  metoprolol (LOPRESSOR) 100 MG tablet Take 100 mg by mouth 2 (two) times daily. Patient not taking: Reported on 01/24/2020    [provider]  mirtazapine (REMERON) 15 MG tablet Take 22.5 mg by mouth at bedtime. Patient not taking: Reported on 01/22/2020    [provider]  ondansetron (ZOFRAN) 4 MG tablet Take 4 mg by mouth every 8 (eight) hours as needed for nausea or vomiting.    [provider]  oxyCODONE (ROXICODONE) 5 MG immediate release tablet Take 1 tablet (5 mg total) by mouth every 6 (six) hours as needed for up to 3 days. 01/22/20 01/25/20  Vanessa Adel, MD  pseudoephedrine (SUDAFED)  30 MG tablet Take 30 mg by mouth every 6 (six) hours as needed for congestion.     [provider]  risperiDONE (RISPERDAL) 2 MG tablet Take 2 mg by mouth at bedtime. Patient not taking: Reported on 01/22/2020    [provider]    Physical Exam: Vitals:   01/25/20 1019 01/25/20 1218  BP: (!) 116/103 (!) 152/90  Pulse: 77   Resp: 15 16  Temp: 97.8 F (36.6 C) (!) 96.6 F (35.9 C)  TempSrc: Oral Axillary  SpO2: 100% 100%  Weight:  80.7 kg  Height:  5\' 2"  (1.575 m)     Vitals:   01/25/20 1019 01/25/20 1218  BP: (!) 116/103 (!) 152/90  Pulse: 77   Resp: 15 16  Temp: 97.8 F (36.6 C) (!) 96.6 F (35.9 C)  TempSrc: Oral Axillary  SpO2: 100% 100%  Weight:  80.7 kg  Height:  5\' 2"  (1.575 m)    Constitutional: NAD, alert and oriented x 3 Eyes: PERRL, lids and conjunctivae normal ENMT: Mucous membranes are moist.  Neck: normal, supple, no masses, no thyromegaly Respiratory: clear to auscultation bilaterally, no wheezing, no crackles. Normal respiratory effort. No accessory muscle use.  Cardiovascular: Regular rate and rhythm, no murmurs / rubs / gallops. No extremity edema. 2+ pedal pulses. No carotid bruits.  Abdomen: no tenderness, no masses palpated. No hepatosplenomegaly. Bowel sounds positive.  Musculoskeletal: no clubbing / cyanosis. No joint deformity upper and lower extremities.  Skin: no rashes, lesions, ulcers.  Neurologic: Contracture of left upper extremity Psychiatric: Normal mood and affect.   Labs on Admission: I have personally reviewed following labs and imaging studies  CBC: Recent Labs  Lab 01/22/20 1906 01/24/20 0846 01/25/20 1029  WBC 12.9* 14.5* 15.8*  NEUTROABS  --   --  12.6*  HGB 11.6* 10.4* 9.9*  HCT 38.0 32.7* 30.9*  MCV 72.5* 69.9* 70.1*  PLT 477* 453* 480*   Basic Metabolic Panel: Recent Labs  Lab 01/22/20 1906 01/24/20 0859 01/25/20 1029  NA 138 134* 134*  K 4.2 4.4 4.3  CL 98 100 98  CO2 25 22 19*  GLUCOSE  161* 114* 137*  BUN 14 13 17   CREATININE 0.89 0.80 1.10*  CALCIUM 9.8 8.9 9.3   GFR: Estimated Creatinine Clearance: 54.8 mL/min (A) (by C-G formula based on SCr of 1.1 mg/dL (H)). Liver Function Tests: Recent Labs  Lab 01/22/20 1906  AST 17  ALT 12  ALKPHOS 86  BILITOT 0.6  PROT 8.7*  ALBUMIN 4.6   Recent Labs  Lab 01/22/20 1906  LIPASE 46   No results for input(s): AMMONIA in the last 168 hours. Coagulation Profile: Recent Labs  Lab 01/24/20 1315 01/25/20 1029  INR 1.0 1.0   Cardiac Enzymes: No results for input(s): CKTOTAL, CKMB, CKMBINDEX, TROPONINI in the last 168 hours. BNP (last 3 results) No results for input(s): PROBNP in the last 8760 hours. HbA1C: No results for input(s): HGBA1C in the last 72 hours. CBG: Recent Labs  Lab 01/25/20 1221  GLUCAP 111*   Lipid Profile: No results for input(s): CHOL, HDL, LDLCALC, TRIG, CHOLHDL, LDLDIRECT in the last 72 hours. Thyroid Function Tests: No results for input(s): TSH, T4TOTAL, FREET4, T3FREE, THYROIDAB in the last 72 hours. Anemia Panel: No results for input(s): VITAMINB12, FOLATE, FERRITIN, TIBC, IRON, RETICCTPCT in the last 72 hours. Urine analysis:    Component Value Date/Time   COLORURINE YELLOW (A) 05/28/2016 1545   APPEARANCEUR CLEAR (A) 05/28/2016 1545   APPEARANCEUR CLOUDY 08/05/2014 1906   LABSPEC 1.006 05/28/2016 1545   LABSPEC 1.026 08/05/2014 1906   PHURINE 7.0 05/28/2016 1545   GLUCOSEU NEGATIVE 05/28/2016 1545   GLUCOSEU NEGATIVE 08/05/2014 1906   HGBUR NEGATIVE 05/28/2016 1545   BILIRUBINUR NEGATIVE 05/28/2016 1545   BILIRUBINUR NEGATIVE 08/05/2014 1906   KETONESUR NEGATIVE 05/28/2016 1545   PROTEINUR NEGATIVE 05/28/2016 1545   NITRITE NEGATIVE 05/28/2016 1545   LEUKOCYTESUR NEGATIVE 05/28/2016 1545   LEUKOCYTESUR 2+ 08/05/2014 1906    Radiological Exams on Admission: CT Angio Chest PE W and/or Wo Contrast  Result Date: 01/24/2020 CLINICAL DATA:  59 year old with chest wall  pain. Concern for pulmonary embolism. EXAM: CT ANGIOGRAPHY CHEST TECHNIQUE: Multidetector CT imaging through the chest using the standard protocol during bolus administration of intravenous contrast. Multiplanar reconstructed images and MIPs were obtained and reviewed to evaluate the vascular  anatomy. CONTRAST:  10mL OMNIPAQUE IOHEXOL 350 MG/ML SOLN COMPARISON:  Chest radiograph 01/24/2020 FINDINGS: CTA CHEST FINDINGS Cardiovascular: Satisfactory opacification of the pulmonary arteries to the segmental level. No evidence of pulmonary embolism. Normal heart size. No pericardial effusion. Coronary artery calcifications. Normal caliber of the thoracic aorta with atherosclerotic calcifications. Mediastinum/Nodes: Small axillary lymph nodes. No significant mediastinal or hilar lymph node enlargement. Lungs/Pleura: Trachea and mainstem bronchi are patent. No large pleural effusions. Lungs are clear without airspace disease or consolidation. Upper abdomen: High-density structure in the gallbladder that measures roughly 1.1 cm and suggestive for gallstone. No acute abnormality in the upper abdomen. Musculoskeletal: No acute bone abnormality. Review of the MIP images confirms the above findings. IMPRESSION: 1. No acute chest abnormality.  No evidence for pulmonary embolism. 2. Aortic Atherosclerosis (ICD10-I70.0). Coronary artery calcifications. 3. Cholelithiasis. Electronically Signed   By: Richarda Overlie M.D.   On: 01/24/2020 12:10   DG Chest Portable 1 View  Result Date: 01/24/2020 CLINICAL DATA:  Chest pain for 5 days. EXAM: PORTABLE CHEST 1 VIEW COMPARISON:  01/22/2020 FINDINGS: Patient is rotated to the left. Low lung volumes are seen. Heart size is within normal limits. Aortic atherosclerosis noted. Both lungs are clear. IMPRESSION: Low lung volumes. No active disease. Electronically Signed   By: Danae Orleans M.D.   On: 01/24/2020 09:55    EKG: Independently reviewed.  ST elevation in the inferior lateral  leads  Assessment/Plan Principal Problem:   Acute ST elevation myocardial infarction (STEMI) of inferolateral wall (HCC) Active Problems:   Stroke (HCC)   Diabetes mellitus without complication (HCC)   Hypertension   Blind   Hypothyroidism (acquired)    Acute ST elevation MI In a patient with known coronary artery disease Patient is status post emergent coronary angiogram with PCI and stent to the mid LCX Continue DAPT, statins, nitrates and beta-blockers   Diabetes mellitus Maintain consistent carbohydrate diet Sliding scale coverage with Accu-Cheks before meals and at bedtime   Hypertension Continue metoprolol   Cortical blindness She requires assistance with activities of daily living    Hypothyroidism Continue Synthroid   Anxiety disorder Continue buspirone, clonazepam and Valium    DVT prophylaxis: Lovenox Code Status: DNR  Family Communication: Greater than 50 percent of time was spent discussing plan of care with patient at the bedside.  All questions and concerns were addressed.  She verbalizes understanding and agrees with the plan. Disposition Plan: Back to previous home environment Consults called: Cardiology    Lucile Shutters MD [facility]     01/25/2020, 12:32 PM

## 2020-01-25 NOTE — Progress Notes (Signed)
°   01/25/20 1400  Clinical Encounter Type  Visited With Patient  Visit Type Initial  Referral From Nurse  Consult/Referral To Chaplain  Spiritual Encounters  Spiritual Needs Emotional;Other (Comment)  CH visited patient in ICU bed 13. Pt had knees pressed against chest upon arrival. Head of bed elevated several inches. 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 No further needs expressed at this time.

## 2020-01-25 NOTE — Consult Note (Signed)
Cardiology Consultation:   Patient ID: Victoria Medina MRN: [id number]; DOB: May 15, 1961  Admit date: 01/25/2020 Date of Consult: 01/25/2020  Primary Care Provider: Venia Carbon, MD [facility] Cardiologist: Thurston Hole) previously followed at [facility] cardiology [place] Electrophysiologist:  None    Patient Profile:   Victoria Medina is a 59 y.o. female with a hx of coronary artery disease with extensive stenting in 2019, previous stroke with left-sided hemiplegia who is being seen today for the evaluation of inferolateral ST elevation myocardial infarction at the request of Dr. Joni Fears.  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  She has multiple chronic medical conditions including diabetes mellitus, essential hypertension and hyperlipidemia.  She came to the hospital yesterday with left-sided chest pain described as heaviness and tightness and was found to have mildly elevated troponin.  It was recommended to admit to the hospital and start unfractionated heparin.  However, the patient wanted to be discharged home with a palliative care approach and she left the hospital without treatment for myocardial infarction.  She week up today with worsening chest pain with associated shortness of breath and nausea.  Unlike the day before, the pain was continuous and severe and thus she came back to the ED.  She had an EKG done which was highly suspicious for posterior STEMI.  She had posterior leads that clearly showed posterior ST elevation.  The patient's EKG was consistent with inferolateral ST elevation myocardial infarction with posterior involvement. A code STEMI was activated.  I had to discuss with the  patient risks and benefits of the catheterization extensively to try to understand her goals of treatment.  I also had to explain to her that during cardiac catheterization, she will have to be full code temporarily.  She was hesitant but due to her continued chest pain and no relief with narcotic medications, she opted to proceed with emergent left heart catheterization.  I discussed the procedure in details as well as risks and benefits.   Past Medical History:  Diagnosis Date  . Blind   . Diabetes mellitus without complication ([place])   . Hyperlipemia   . Hypertension   . Stroke [facility])     Past Surgical History:  Procedure Laterality Date  . ABDOMINAL HYSTERECTOMY    . BACK SURGERY    . FRACTURE SURGERY    . SKIN GRAFT Right      Home Medications:  Prior to Admission medications   Medication Sig Start Date End Date Taking? Authorizing Provider  aspirin EC 81 MG tablet Take 81 mg by mouth daily. Swallow whole.   Yes [provider]  Azelastine HCl 0.15 % SOLN Place 2 sprays into both nostrils 2 (two) times daily. 01/14/20  Yes [provider]  clonazePAM (KLONOPIN) 2 MG tablet Take 0.5 mg by mouth every 8 (eight) hours as needed for anxiety. *limit 3 doses per 24 hour*    Yes [provider]  diazepam (VALIUM) 10 MG tablet Take 10 mg by mouth 2 (two) times daily.   Yes [provider]  lansoprazole (PREVACID) 15 MG capsule Take 15 mg by mouth daily at 12 noon.   Yes [provider]  levothyroxine (SYNTHROID) 25 MCG tablet Take  25 mcg by mouth daily before breakfast.   Yes [provider]  metFORMIN (GLUCOPHAGE) 500 MG tablet Take 500 mg by mouth 2 (two) times daily.   Yes [provider]  nitroGLYCERIN (NITROSTAT) 0.4 MG SL tablet Place 0.4 mg under the tongue every 5 (five) minutes x 3 doses as needed for chest pain.    Yes [provider]  Olopatadine HCl 0.2 % SOLN Place 1 drop into both eyes daily at 6 (six) AM.    Yes [provider]  tiZANidine (ZANAFLEX) 2 MG tablet Take 2 mg by mouth 3 (three) times daily as needed for muscle spasms.   Yes [provider]  vitamin B-12 (CYANOCOBALAMIN) 1000 MCG tablet Take 1,000 mcg by mouth daily.   Yes [provider]  Vitamin D, Ergocalciferol, (DRISDOL) 50000 units CAPS capsule Take 50,000 Units by mouth every 7 (seven) days.   Yes [provider]  atorvastatin (LIPITOR) 20 MG tablet Take 20 mg by mouth every evening.  Patient not taking: Reported on 01/22/2020    [provider]  bisacodyl (DULCOLAX) 10 MG suppository Place 10 mg rectally as needed for moderate constipation.    [provider]  busPIRone (BUSPAR) 10 MG tablet Take 10 mg by mouth 2 (two) times daily. Patient not taking: Reported on 01/22/2020    [provider]  fluticasone (FLONASE) 50 MCG/ACT nasal spray Place 2 sprays into both nostrils 2 (two) times daily.  Patient not taking: Reported on 01/22/2020    [provider]  ibuprofen (ADVIL) 200 MG tablet Take 400 mg by mouth every 8 (eight) hours as needed for mild pain.    [provider]  magnesium citrate SOLN Take 0.5 Bottles by mouth once a week.    [provider]  metoprolol (LOPRESSOR) 100 MG tablet Take 100 mg by mouth 2 (two) times daily. Patient not taking: Reported on 01/24/2020    [provider]  mirtazapine (REMERON) 15 MG tablet Take 22.5 mg by mouth at bedtime. Patient not taking: Reported on 01/22/2020    [provider]  ondansetron (ZOFRAN) 4 MG tablet Take 4 mg by mouth every 8 (eight) hours as needed for nausea or vomiting.    [provider]  oxyCODONE (ROXICODONE) 5 MG immediate release tablet Take 1 tablet (5 mg total) by mouth every 6 (six) hours as needed for up to 3 days. 01/22/20 01/25/20  Concha Se, MD  pseudoephedrine (SUDAFED) 30 MG tablet Take 30 mg by mouth every 6 (six) hours as needed for congestion.      [provider]  risperiDONE (RISPERDAL) 2 MG tablet Take 2 mg by mouth at bedtime. Patient not taking: Reported on 01/22/2020    [provider]    Inpatient Medications: Scheduled Meds: . [START ON 01/26/2020] aspirin EC  81 mg Oral Daily  . atorvastatin  80 mg Oral QPM  . azelastine  2 spray Each Nare BID  . busPIRone  10 mg Oral BID  . [START ON 01/26/2020] enoxaparin (LOVENOX) injection  40 mg Subcutaneous Q24H  . fluticasone  2 spray Each Nare BID  . insulin aspart  0-15 Units Subcutaneous TID WC  . [START ON 01/26/2020] levothyroxine  25 mcg Oral Q0600  . magnesium citrate  0.5 Bottle Oral Weekly  . metoprolol tartrate  100 mg Oral BID  . mirtazapine  22.5 mg Oral QHS  . olopatadine  1 drop Both Eyes BID  . ondansetron      . [  START ON 01/26/2020] pantoprazole  40 mg Oral Daily  . risperiDONE  2 mg Oral QHS  . sodium chloride flush  3 mL Intravenous Q12H  . ticagrelor  90 mg Oral BID  . vitamin B-12  1,000 mcg Oral Daily  . Vitamin D (Ergocalciferol)  50,000 Units Oral Q7 days   Continuous Infusions: . sodium chloride    . sodium chloride    . heparin     PRN Meds: sodium chloride, acetaminophen, bisacodyl, clonazePAM, nitroGLYCERIN, ondansetron (ZOFRAN) IV, ondansetron, oxyCODONE, pseudoephedrine, sodium chloride flush, tiZANidine  Allergies:    Allergies  Allergen Reactions  . Levaquin [Levofloxacin In D5w] Anaphylaxis and Swelling  . Iodinated Diagnostic Agents Itching    Severe itching  . Latex Dermatitis    Social History:   Social History   Socioeconomic History  . Marital status: Single    Spouse name: Not on file  . Number of children: Not on file  . Years of education: Not on file  . Highest education level: Not on file  Occupational History  . Not on file  Tobacco Use  . Smoking status: Never Smoker  . Smokeless tobacco: Never Used  Substance and Sexual Activity  . Alcohol use: No  . Drug use: No  . Sexual activity: Not on  file  Other Topics Concern  . Not on file  Social History Narrative  . Not on file   Social Determinants of Health   Financial Resource Strain:   . Difficulty of Paying Living Expenses:   Food Insecurity:   . Worried About Programme researcher, broadcasting/film/video in the Last Year:   . Barista in the Last Year:   Transportation Needs:   . Freight forwarder (Medical):   Marland Kitchen Lack of Transportation (Non-Medical):   Physical Activity:   . Days of Exercise per Week:   . Minutes of Exercise per Session:   Stress:   . Feeling of Stress :   Social Connections:   . Frequency of Communication with Friends and Family:   . Frequency of Social Gatherings with Friends and Family:   . Attends Religious Services:   . Active Member of Clubs or Organizations:   . Attends Banker Meetings:   Marland Kitchen Marital Status:   Intimate Partner Violence:   . Fear of Current or Ex-Partner:   . Emotionally Abused:   Marland Kitchen Physically Abused:   . Sexually Abused:     Family History:   No family history of premature coronary artery disease.  ROS:  Please see the history of present illness.   All other ROS reviewed and negative.     Physical Exam/Data:   Vitals:   01/25/20 1019 01/25/20 1218  BP: (!) 116/103 (!) 152/90  Pulse: 77   Resp: 15 16  Temp: 97.8 F (36.6 C) (!) 96.6 F (35.9 C)  TempSrc: Oral Axillary  SpO2: 100% 100%  Weight:  80.7 kg  Height:   (1.575 m)   No intake or output data in the 24 hours ending 01/25/20 1242 Last 3 Weights 01/25/2020 01/24/2020 01/22/2020  Weight (lbs) 177 lb 14.6 oz 169 lb 12.1 oz 170 lb  Weight (kg) 80.7 kg 77 kg 77.111 kg     Body mass index is 32.54 kg/m.  General:  Well nourished, well developed, in no acute distress HEENT: normal Lymph: no adenopathy Neck: no JVD Endocrine:  No thryomegaly Vascular: No carotid bruits; FA pulses 2+ bilaterally without bruits  Cardiac:  normal S1, S2; RRR; no murmur  Lungs:  clear to auscultation bilaterally, no  wheezing, rhonchi or rales  Abd: soft, nontender, no hepatomegaly  Ext: no edema Musculoskeletal:  No deformities, BUE and BLE strength normal and equal Skin: warm and dry  Neuro:  CNs 2-12 intact, no focal abnormalities noted Psych:  Normal affect   EKG:  The EKG was personally reviewed and demonstrates: Normal sinus rhythm with minor inferior ST elevation with significant ST depression in the anterior leads with prominent R waves highly suggestive of posterior STEMI.  Posterior lead EKG confirmed posterior ST elevation. Telemetry:  Telemetry was personally reviewed and demonstrates: Sinus rhythm with PVCs  Relevant CV Studies: Echocardiogram is pending  Laboratory Data:  High Sensitivity Troponin:   Recent Labs  Lab 01/22/20 1906 01/22/20 2044 01/24/20 0859 01/24/20 1102 01/25/20 1029  TROPONINIHS 16 17 244* 436* 736*     Chemistry Recent Labs  Lab 01/22/20 1906 01/24/20 0859 01/25/20 1029  NA 138 134* 134*  K 4.2 4.4 4.3  CL 98 100 98  CO2 25 22 19*  GLUCOSE 161* 114* 137*  BUN 14 13 17   CREATININE 0.89 0.80 1.10*  CALCIUM 9.8 8.9 9.3  GFRNONAA >60 >60 55*  GFRAA >60 >60 >60  ANIONGAP 15 12 17*    Recent Labs  Lab 01/22/20 1906  PROT 8.7*  ALBUMIN 4.6  AST 17  ALT 12  ALKPHOS 86  BILITOT 0.6   Hematology Recent Labs  Lab 01/22/20 1906 01/24/20 0846 01/25/20 1029  WBC 12.9* 14.5* 15.8*  RBC 5.24* 4.68 4.41  HGB 11.6* 10.4* 9.9*  HCT 38.0 32.7* 30.9*  MCV 72.5* 69.9* 70.1*  MCH 22.1* 22.2* 22.4*  MCHC 30.5 31.8 32.0  RDW 16.2* 16.2* 16.2*  PLT 477* 453* 480*   BNP Recent Labs  Lab 01/22/20 1906  BNP 73.1    DDimer No results for input(s): DDIMER in the last 168 hours.   Radiology/Studies:  DG Chest 2 View  Result Date: 01/22/2020 CLINICAL DATA:  Chest pain EXAM: CHEST - 2 VIEW COMPARISON:  12/11/2015 FINDINGS: The heart size and mediastinal contours are within normal limits. Aortic atherosclerosis. Both lungs are clear. The visualized  skeletal structures are unremarkable. IMPRESSION: No active cardiopulmonary disease. Electronically Signed   By: 12/13/2015 M.D.   On: 01/22/2020 19:34   CT Angio Chest PE W and/or Wo Contrast  Result Date: 01/24/2020 CLINICAL DATA:  59 year old with chest wall pain. Concern for pulmonary embolism. EXAM: CT ANGIOGRAPHY CHEST TECHNIQUE: Multidetector CT imaging through the chest using the standard protocol during bolus administration of intravenous contrast. Multiplanar reconstructed images and MIPs were obtained and reviewed to evaluate the vascular anatomy. CONTRAST:  61mL OMNIPAQUE IOHEXOL 350 MG/ML SOLN COMPARISON:  Chest radiograph 01/24/2020 FINDINGS: CTA CHEST FINDINGS Cardiovascular: Satisfactory opacification of the pulmonary arteries to the segmental level. No evidence of pulmonary embolism. Normal heart size. No pericardial effusion. Coronary artery calcifications. Normal caliber of the thoracic aorta with atherosclerotic calcifications. Mediastinum/Nodes: Small axillary lymph nodes. No significant mediastinal or hilar lymph node enlargement. Lungs/Pleura: Trachea and mainstem bronchi are patent. No large pleural effusions. Lungs are clear without airspace disease or consolidation. Upper abdomen: High-density structure in the gallbladder that measures roughly 1.1 cm and suggestive for gallstone. No acute abnormality in the upper abdomen. Musculoskeletal: No acute bone abnormality. Review of the MIP images confirms the above findings. IMPRESSION: 1. No acute chest abnormality.  No evidence for pulmonary embolism. 2. Aortic Atherosclerosis (ICD10-I70.0). Coronary artery calcifications.  3. Cholelithiasis. Electronically Signed   By: Richarda Overlie M.D.   On: 01/24/2020 12:10   CARDIAC CATHETERIZATION  Result Date: 01/25/2020  There is moderate to severe left ventricular systolic dysfunction.  LV end diastolic pressure is severely elevated.  Prox RCA lesion is 10% stenosed.  Mid RCA lesion is 50%  stenosed.  Dist RCA lesion is 30% stenosed.  Prox LAD lesion is 20% stenosed.  Prox LAD to Mid LAD lesion is 90% stenosed.  Mid LAD to Dist LAD lesion is 20% stenosed.  Mid LAD-1 lesion is 20% stenosed.  Mid LAD-2 lesion is 70% stenosed.  2nd Diag lesion is 80% stenosed.  Prox Cx lesion is 40% stenosed.  Mid Cx lesion is 100% stenosed.  3rd Mrg lesion is 20% stenosed.  Post intervention, there is a 0% residual stenosis.  A drug-eluting stent was successfully placed using a STENT RESOLUTE ONYX 2.25X26.  Dist Cx lesion is 70% stenosed.  1.  Significant underlying three-vessel coronary artery disease with multiple stents in the LAD, left circumflex and right coronary artery.  The culprit for myocardial infarction is an occluded mid left circumflex at the mid to distal edge of the previously placed stent with diffuse disease extending into the stented segment of OM 3.  Patent LAD stents.  However, multiple areas of significant stenosis between the stented segments. 2.  Moderately to severely reduced LV systolic function with an EF of 30 to 35%.  Severely elevated left ventricular end-diastolic pressure at 32 mmHg. 3.  Successful angioplasty and drug-eluting stent placement to the mid left circumflex extending into OM 3 to overlap with the previously placed stent. Recommendations: Dual antiplatelet therapy for at least 1 year. Aggressive treatment of risk factors. The patient is likely not a candidate for further revascularization given her comorbidities and desire for conservative approach. Please note that there was delay in bringing the patient to the Cath Lab given that I had to have a discussion with her about the risks and benefits of cardiac catheterization as she refused any further evaluation the day before and she left the hospital to pursue palliative care.  The patient agreed to have catheterization done when her chest pain worsened with no relief with narcotic medications.   DG Chest Portable  1 View  Result Date: 01/24/2020 CLINICAL DATA:  Chest pain for 5 days. EXAM: PORTABLE CHEST 1 VIEW COMPARISON:  01/22/2020 FINDINGS: Patient is rotated to the left. Low lung volumes are seen. Heart size is within normal limits. Aortic atherosclerosis noted. Both lungs are clear. IMPRESSION: Low lung volumes. No active disease. Electronically Signed   By: Danae Orleans M.D.   On: 01/24/2020 09:55    TIMI Risk Score for ST  Elevation MI:   The patient's TIMI risk score is 4, which indicates a 7.3% risk of all cause mortality at 30 days.     Assessment and Plan:   1. Inferolateral ST elevation myocardial infarction with posterior involvement: Emergent cardiac catheterization was done via the right radial artery which showed occluded mid left circumflex.  This was treated successfully with PCI and drug-eluting stent placement extending into OM 3.  This established TIMI-3 flow.  The patient has multiple stents in the LAD, left circumflex and right coronary arteries.  In total, I counted 6 stents.  The stents in the LAD are patent.  However, there is severe disease in multiple areas in the nonstented segments.  I doubt that she will be a candidate for further revascularization.  Recommend  continuing dual antiplatelet therapy for at least 12 months.  Aggressive treatment of risk factors recommended.  Obtain an echocardiogram which was ordered.  I referred her to cardiac rehab. 2. Chronic systolic heart failure with nonischemic cardiomyopathy: Previous reported ejection fraction was 35% at [facility] in 2015.  Current EF appears to be around 30 to 35%.  Will obtain an echocardiogram.  LVEDP was significantly elevated at 32 to 34 mmHg.  If the patient has shortness of breath, we can use IV furosemide as needed.  Continue metoprolol but we should consider switching her to carvedilol and add an ACE inhibitor/ARB if renal function allows. 3. Previous CVA with left-sided hemiplegia and blindness 4. Hyperlipidemia: I  increase atorvastatin to 80 mg daily.      For questions or updates, please contact [facility] Please consult [email or web address] for contact info under    Signed, Lorine Bears, MD  01/25/2020 12:42 PM

## 2020-01-25 NOTE — ED Provider Notes (Signed)
[facility] Emergency Department Provider Note  ____________________________________________  Time seen: Approximately 10:25 AM  I have reviewed the triage vital signs and the nursing notes.   HISTORY  Chief Complaint Chest Pain    HPI Victoria Medina is a 59 y.o. female with a history of hypertension diabetes stroke chronic left-sided hemiplegia and cortical blindness he was seen in the ED yesterday, diagnosed with non-STEMI, plan for admission on heparin and cardiology consult.  Patient decided at that time to leave the hospital AGAINST MEDICAL ADVICE because her pain had improved and she wanted to pursue palliative care.  Patient reports this morning pain has returned and is more intense, started at 7:30 AM and unremitting.  Given aspirin and nitroglycerin by EMS with no improvement.  It is constant severe radiating to the back.  No aggravating or alleviating factors.      Past Medical History:  Diagnosis Date  . Blind   . Diabetes mellitus without complication (HCC)   . Hyperlipemia   . Hypertension   . Stroke [facility])      Patient Active Problem List   Diagnosis Date Noted  . NSTEMI (non-ST elevated myocardial infarction) (HCC) 01/24/2020  . Stroke (HCC)   . Diabetes mellitus without complication (HCC)   . Hypertension   . Blind   . Hypothyroidism (acquired)   . Anxiety      Past Surgical History:  Procedure Laterality Date  . ABDOMINAL HYSTERECTOMY    . BACK SURGERY    . FRACTURE SURGERY    . SKIN GRAFT Right      Prior to Admission medications   Medication Sig Start Date End Date Taking? Authorizing Provider  aspirin EC 81 MG tablet Take 81 mg by mouth daily. Swallow whole.   Yes [provider]  Azelastine HCl 0.15 % SOLN Place 2 sprays into both nostrils 2 (two) times daily. 01/14/20  Yes [provider]  clonazePAM (KLONOPIN) 2 MG tablet Take 0.5 mg by mouth every 8 (eight) hours as needed for anxiety. *limit 3  doses per 24 hour*    Yes [provider]  diazepam (VALIUM) 10 MG tablet Take 10 mg by mouth 2 (two) times daily.   Yes [provider]  lansoprazole (PREVACID) 15 MG capsule Take 15 mg by mouth daily at 12 noon.   Yes [provider]  levothyroxine (SYNTHROID) 25 MCG tablet Take 25 mcg by mouth daily before breakfast.   Yes [provider]  metFORMIN (GLUCOPHAGE) 500 MG tablet Take 500 mg by mouth 2 (two) times daily.   Yes [provider]  nitroGLYCERIN (NITROSTAT) 0.4 MG SL tablet Place 0.4 mg under the tongue every 5 (five) minutes x 3 doses as needed for chest pain.    Yes [provider]  Olopatadine HCl 0.2 % SOLN Place 1 drop into both eyes daily at 6 (six) AM.   Yes [provider]  tiZANidine (ZANAFLEX) 2 MG tablet Take 2 mg by mouth 3 (three) times daily as needed for muscle spasms.   Yes [provider]  vitamin B-12 (CYANOCOBALAMIN) 1000 MCG tablet Take 1,000 mcg by mouth daily.   Yes [provider]  Vitamin D, Ergocalciferol, (DRISDOL) 50000 units CAPS capsule Take 50,000 Units by mouth every 7 (seven) days.   Yes [provider]  atorvastatin (LIPITOR) 20 MG tablet Take 20 mg by mouth every evening.  Patient not taking: Reported on 01/22/2020    [provider]  bisacodyl (DULCOLAX) 10  MG suppository Place 10 mg rectally as needed for moderate constipation.    [provider]  busPIRone (BUSPAR) 10 MG tablet Take 10 mg by mouth 2 (two) times daily. Patient not taking: Reported on 01/22/2020    [provider]  fluticasone (FLONASE) 50 MCG/ACT nasal spray Place 2 sprays into both nostrils 2 (two) times daily.  Patient not taking: Reported on 01/22/2020    [provider]  ibuprofen (ADVIL) 200 MG tablet Take 400 mg by mouth every 8 (eight) hours as needed for mild pain.    [provider]  magnesium citrate SOLN Take 0.5 Bottles by mouth once a week.     [provider]  metoprolol (LOPRESSOR) 100 MG tablet Take 100 mg by mouth 2 (two) times daily. Patient not taking: Reported on 01/24/2020    [provider]  mirtazapine (REMERON) 15 MG tablet Take 22.5 mg by mouth at bedtime. Patient not taking: Reported on 01/22/2020    [provider]  ondansetron (ZOFRAN) 4 MG tablet Take 4 mg by mouth every 8 (eight) hours as needed for nausea or vomiting.    [provider]  oxyCODONE (ROXICODONE) 5 MG immediate release tablet Take 1 tablet (5 mg total) by mouth every 6 (six) hours as needed for up to 3 days. 01/22/20 01/25/20  Concha Se, MD  pseudoephedrine (SUDAFED) 30 MG tablet Take 30 mg by mouth every 6 (six) hours as needed for congestion.     [provider]  risperiDONE (RISPERDAL) 2 MG tablet Take 2 mg by mouth at bedtime. Patient not taking: Reported on 01/22/2020    [provider]     Allergies Levaquin [levofloxacin in d5w], Iodinated diagnostic agents, and Latex   History reviewed. No pertinent family history.  Social History Social History   Tobacco Use  . Smoking status: Never Smoker  . Smokeless tobacco: Never Used  Substance Use Topics  . Alcohol use: No  . Drug use: No    Review of Systems  Constitutional:   No fever or chills.  ENT:   No sore throat. No rhinorrhea. Cardiovascular:   Positive chest pain without syncope. Respiratory:   No dyspnea or cough. Gastrointestinal:   Negative for abdominal pain, vomiting and diarrhea.  Musculoskeletal:   Negative for focal pain or swelling All other systems reviewed and are negative except as documented above in ROS and HPI.  ____________________________________________   PHYSICAL EXAM:  VITAL SIGNS: ED Triage Vitals  Enc Vitals Group     BP 01/25/20 1019 (!) 116/103     Pulse Rate 01/25/20 1019 77     Resp 01/25/20 1019 15     Temp 01/25/20 1019 97.8 F (36.6 C)     Temp Source 01/25/20 1019 Oral     SpO2  01/25/20 1019 100 %     Weight --      Height --      Head Circumference --      Peak Flow --      Pain Score 01/25/20 1020 10     Pain Loc --      Pain Edu? --      Excl. in GC? --     Vital signs reviewed, nursing assessments reviewed.   Constitutional:   Alert and oriented. Non-toxic appearance. Eyes:   Conjunctivae are normal. EOMI. PERRL.  Chronic blindness ENT      Head:   Normocephalic and atraumatic.      Nose:   Wearing  a mask.      Mouth/Throat:   Wearing a mask.      Neck:   No meningismus. Full ROM. Hematological/Lymphatic/Immunilogical:   No cervical lymphadenopathy. Cardiovascular:   RRR. Symmetric bilateral radial and DP pulses.  No murmurs. Cap refill less than 2 seconds. Respiratory:   Normal respiratory effort without tachypnea/retractions. Breath sounds are clear and equal bilaterally. No wheezes/rales/rhonchi. Gastrointestinal:   Soft and nontender. Non distended. There is no CVA tenderness.  No rebound, rigidity, or guarding.  Musculoskeletal:   Normal range of motion in all extremities. No joint effusions.  No lower extremity tenderness.  No edema. Neurologic:   Normal speech and language.  Motor grossly intact. No acute focal neurologic deficits are appreciated.  Skin:    Skin is warm, dry and intact. No rash noted.  No petechiae, purpura, or bullae.  ____________________________________________    LABS (pertinent positives/negatives) (all labs ordered are listed, but only abnormal results are displayed) Labs Reviewed  BASIC METABOLIC PANEL  CBC WITH DIFFERENTIAL/PLATELET  PROTIME-INR  HEPARIN LEVEL (UNFRACTIONATED)  TROPONIN I (HIGH SENSITIVITY)   ____________________________________________   EKG  EKG interpreted by me Sinus rhythm rate of 78, normal axis and intervals.  Normal QRS.  ST elevation in inferior and lateral leads with reciprocal ST depression and T wave inversion in V2. Posterior leads show ST elevation in  V7-9.  ____________________________________________    RADIOLOGY  CT Angio Chest PE W and/or Wo Contrast  Result Date: 01/24/2020 CLINICAL DATA:  59 year old with chest wall pain. Concern for pulmonary embolism. EXAM: CT ANGIOGRAPHY CHEST TECHNIQUE: Multidetector CT imaging through the chest using the standard protocol during bolus administration of intravenous contrast. Multiplanar reconstructed images and MIPs were obtained and reviewed to evaluate the vascular anatomy. CONTRAST:  40mL OMNIPAQUE IOHEXOL 350 MG/ML SOLN COMPARISON:  Chest radiograph 01/24/2020 FINDINGS: CTA CHEST FINDINGS Cardiovascular: Satisfactory opacification of the pulmonary arteries to the segmental level. No evidence of pulmonary embolism. Normal heart size. No pericardial effusion. Coronary artery calcifications. Normal caliber of the thoracic aorta with atherosclerotic calcifications. Mediastinum/Nodes: Small axillary lymph nodes. No significant mediastinal or hilar lymph node enlargement. Lungs/Pleura: Trachea and mainstem bronchi are patent. No large pleural effusions. Lungs are clear without airspace disease or consolidation. Upper abdomen: High-density structure in the gallbladder that measures roughly 1.1 cm and suggestive for gallstone. No acute abnormality in the upper abdomen. Musculoskeletal: No acute bone abnormality. Review of the MIP images confirms the above findings. IMPRESSION: 1. No acute chest abnormality.  No evidence for pulmonary embolism. 2. Aortic Atherosclerosis (ICD10-I70.0). Coronary artery calcifications. 3. Cholelithiasis. Electronically Signed   By: Richarda Overlie M.D.   On: 01/24/2020 12:10    ____________________________________________   PROCEDURES .Critical Care Performed by: Sharman Cheek, MD Authorized by: Sharman Cheek, MD   Critical care provider statement:    Critical care time (minutes):  35   Critical care time was exclusive of:  Separately billable procedures and treating  other patients   Critical care was necessary to treat or prevent imminent or life-threatening deterioration of the following conditions:  Cardiac failure   Critical care was time spent personally by me on the following activities:  Development of treatment plan with patient or surrogate, discussions with consultants, evaluation of patient's response to treatment, examination of patient, obtaining history from patient or surrogate, ordering and performing treatments and interventions, ordering and review of laboratory studies, ordering and review of radiographic studies, pulse oximetry, re-evaluation of patient's condition and review of old charts  ____________________________________________    CLINICAL IMPRESSION / ASSESSMENT AND PLAN / ED COURSE  Medications ordered in the ED: Medications  heparin 25000-0.45 UT/250ML-% infusion (has no administration in time range)  ondansetron (ZOFRAN) 4 MG/2ML injection (has no administration in time range)  heparin bolus via infusion 3,900 Units (has no administration in time range)  heparin ADULT infusion 100 units/mL (25000 units/2100mL sodium chloride 0.45%) (has no administration in time range)  sodium chloride 0.9 % bolus 500 mL (500 mLs Intravenous New Bag/Given 01/25/20 1047)  fentaNYL (SUBLIMAZE) injection 100 mcg (100 mcg Intravenous Given 01/25/20 1037)    Pertinent labs & imaging results that were available during my care of the patient were reviewed by me and considered in my medical decision making (see chart for details).  Victoria Medina was evaluated in Emergency Department on 01/25/2020 for the symptoms described in the history of present illness. She was evaluated in the context of the global COVID-19 pandemic, which necessitated consideration that the patient might be at risk for infection with the SARS-CoV-2 virus that causes COVID-19. Institutional protocols and algorithms that pertain to the evaluation of patients at risk for COVID-19  are in a state of rapid change based on information released by regulatory bodies including the CDC and federal and state organizations. These policies and algorithms were followed during the patient's care in the ED.   Patient returns today with recurrence of pain, EKG is diagnostic of STEMI.  Discussed the patient's goals of care with her and advised her of high likelihood of death if we proceed with palliative management only.  She now recants her decision for palliative care.  When advised that cardiology may wish to do an invasive cardiac catheterization procedure and place a stent and plan to hospitalize her, she now agrees with this plan.  We will start IV, check labs, give IV heparin bolus.  Code STEMI activated.  Clinical Course as of Jan 24 1101  Sun Jan 25, 2020  1043 D/w Dr. Fletcher Anon, who is en route to hospital and will eval shortly.   [PS]  1059 Dr. Fletcher Anon plans to proceed to cath. Covid test negative yesterday   [PS]    Clinical Course User Index [PS] Carrie Mew, MD     ____________________________________________   FINAL CLINICAL IMPRESSION(S) / ED DIAGNOSES    Final diagnoses:  ST elevation myocardial infarction (STEMI), unspecified artery ([place])  Type 2 diabetes mellitus without complication, unspecified whether long term insulin use [facility])     ED Discharge Orders    None      Portions of this note were generated with dragon dictation software. Dictation errors may occur despite best attempts at proofreading.   Carrie Mew, MD 01/25/20 [phone]

## 2020-01-25 NOTE — Progress Notes (Signed)
ANTICOAGULATION CONSULT NOTE - Initial Consult  Pharmacy Consult for Heparin Indication: chest pain/ACS  Allergies  Allergen Reactions  . Levaquin [Levofloxacin In D5w] Anaphylaxis and Swelling  . Iodinated Diagnostic Agents Itching    Severe itching  . Latex Dermatitis    Patient Measurements:   Heparin Dosing Weight: 65 kg  Vital Signs: Temp: 97.8 F (36.6 C) (06/27 1019) Temp Source: Oral (06/27 1019) BP: 116/103 (06/27 1019) Pulse Rate: 77 (06/27 1019)  Labs: Recent Labs    01/22/20 1906 01/22/20 1906 01/22/20 2044 01/24/20 0846 01/24/20 0859 01/24/20 1102 01/24/20 1315  HGB 11.6*  --   --  10.4*  --   --   --   HCT 38.0  --   --  32.7*  --   --   --   PLT 477*  --   --  453*  --   --   --   APTT  --   --   --   --   --   --  26  LABPROT  --   --   --   --   --   --  12.8  INR  --   --   --   --   --   --  1.0  CREATININE 0.89  --   --   --  0.80  --   --   TROPONINIHS 16   < > 17  --  244* 436*  --    < > = values in this interval not displayed.    Estimated Creatinine Clearance: 72 mL/min (by C-G formula based on SCr of 0.8 mg/dL).   Medical History: Past Medical History:  Diagnosis Date  . Blind   . Diabetes mellitus without complication (HCC)   . Hyperlipemia   . Hypertension   . Stroke [facility])       Assessment: 59 yo female here with chest pain and elevated troponins; started on heparin drip 6/26. Pt left AMA and heparin drip was stopped 6/26 at 1645. No oral anticoagulants seen on PTA list.  Pt returns 6/27 with continued chest pain.  Baseline aPTT and INR - 6/26 aPTT 26, INR 1.0  Goal of Therapy:  Heparin level 0.3-0.7 units/ml Monitor platelets by anticoagulation protocol: Yes   Plan:  Give 3900 units bolus x 1 Start heparin infusion at 800 units/hr Check anti-Xa level in 6 hours and daily while on heparin Continue to monitor H&H and platelets - CBC in AM  Pharmacy will continue to follow.   Crist Fat L 01/25/2020,10:53 AM

## 2020-01-26 ENCOUNTER — Inpatient Hospital Stay (HOSPITAL_COMMUNITY)
Admit: 2020-01-26 | Discharge: 2020-01-26 | Disposition: A | Discharge status: Home / Self Care | Payer: Medicare Other | Attending: Cardiovascular Disease | Admitting: Cardiovascular Disease

## 2020-01-26 ENCOUNTER — Encounter: Payer: Self-pay | Admitting: Cardiovascular Disease

## 2020-01-26 DIAGNOSIS — I214 Non-ST elevation (NSTEMI) myocardial infarction: Secondary | ICD-10-CM

## 2020-01-26 LAB — BASIC METABOLIC PANEL
Anion gap: 14 (ref 5–15)
BUN: 18 mg/dL (ref 6–20)
CO2: 22 mmol/L (ref 22–32)
Calcium: 9.2 mg/dL (ref 8.9–10.3)
Chloride: 94 mmol/L — ABNORMAL LOW (ref 98–111)
Creatinine, Ser: 0.87 mg/dL (ref 0.44–1.00)
GFR calc Af Amer: >60 mL/min (ref >60)
GFR calc non Af Amer: >60 mL/min (ref >60)
Glucose, Bld: 124 mg/dL — ABNORMAL HIGH (ref 70–99)
Potassium: 3.9 mmol/L (ref 3.5–5.1)
Sodium: 130 mmol/L — ABNORMAL LOW (ref 135–145)

## 2020-01-26 LAB — GLUCOSE, CAPILLARY
Glucose-Capillary: 117 mg/dL — ABNORMAL HIGH (ref 70–99)
Glucose-Capillary: 210 mg/dL — ABNORMAL HIGH (ref 70–99)
Glucose-Capillary: 130 mg/dL — ABNORMAL HIGH (ref 70–99)
Glucose-Capillary: 138 mg/dL — ABNORMAL HIGH (ref 70–99)

## 2020-01-26 LAB — CBC
HCT: 36.9 % (ref 36.0–46.0)
Hemoglobin: 11.5 g/dL — ABNORMAL LOW (ref 12.0–15.0)
MCH: 22.3 pg — ABNORMAL LOW (ref 26.0–34.0)
MCHC: 31.2 g/dL (ref 30.0–36.0)
MCV: 71.7 fL — ABNORMAL LOW (ref 80.0–100.0)
Platelets: 562 10*3/uL — ABNORMAL HIGH (ref 150–400)
RBC: 5.15 MIL/uL — ABNORMAL HIGH (ref 3.87–5.11)
RDW: 16.2 % — ABNORMAL HIGH (ref 11.5–15.5)
WBC: 24.4 10*3/uL — ABNORMAL HIGH (ref 4.0–10.5)
nRBC: 0 % (ref 0.0–0.2)

## 2020-01-26 LAB — HEMOGLOBIN A1C
Hgb A1c MFr Bld: 6.1 % — ABNORMAL HIGH (ref 4.8–5.6)
Mean Plasma Glucose: 128.37 mg/dL

## 2020-01-26 LAB — LIPID PANEL
Cholesterol: 310 mg/dL — ABNORMAL HIGH (ref 0–200)
HDL: 59 mg/dL (ref >40)
LDL Cholesterol: 197 mg/dL — ABNORMAL HIGH (ref 0–99)
Total CHOL/HDL Ratio: 5.3 RATIO
Triglycerides: 269 mg/dL — ABNORMAL HIGH (ref <150)
VLDL: 54 mg/dL — ABNORMAL HIGH (ref 0–40)

## 2020-01-26 LAB — ECHOCARDIOGRAM COMPLETE
Height: 62 in
Weight: 2846.58 oz

## 2020-01-26 LAB — POCT ACTIVATED CLOTTING TIME
Activated Clotting Time: 208 seconds
Activated Clotting Time: 219 seconds

## 2020-01-26 MED ORDER — LOSARTAN POTASSIUM 25 MG PO TABS
25.0000 mg | ORAL_TABLET | Freq: Every day | ORAL | Status: DC
  Filled 2020-01-26 (×2): qty 1

## 2020-01-26 NOTE — Progress Notes (Signed)
Patient extremely noncompliant. Refusing most medication except for oxycodone and clonazepam. Patient states "My doctor took me off of all of those months ago and half of them I've never heard of or have been on." See [facility] documentation.   Patient also expresses wishes to discharge as fast as possible and plans to go strictly to palliative care.   TR band removed around 1930, patient also noncompliant with advised limb precautions/education  Patient will not leave vitals monitor on. Educated and patient states "I don't care, I get all tangled up in them and I want to go home."

## 2020-01-26 NOTE — Progress Notes (Addendum)
Difficult day. Patient refused all sliding scale insulin today. She also pulled external catheter out of place and soaked her bed at least twice. She also refused to eat anything and tried to order sushi for dinner. Ms Hietpas also refused most of her other medications except her 81 mg ASA and Brilinta. Dr. Kirke Corin promised her she could go back to [facility] tomorrow.

## 2020-01-26 NOTE — Progress Notes (Signed)
*  PRELIMINARY RESULTS* Echocardiogram 2D Echocardiogram has been performed.  Victoria Medina 01/26/2020, 9:55 AM 

## 2020-01-26 NOTE — Progress Notes (Signed)
Progress Note    Kristyl Athens  PRF:[id number] DOB: 01-Jun-1961  DOA: 01/25/2020 PCP: Karie Schwalbe, MD      Brief Narrative:    Medical records reviewed and are as summarized below:  Akia Montalban is a 59 y.o. female       Assessment/Plan:   Principal Problem:   Acute ST elevation myocardial infarction (STEMI) of inferolateral wall (HCC) Active Problems:   Stroke (HCC)   Diabetes mellitus without complication (HCC)   Hypertension   Blind   Hypothyroidism (acquired)   Acute ST elevation MI .  Troponin peaked at 4,886 S/p left heart cath with placement of drug-eluting stent to the mid left circumflex.  EF by cath was 30 to 35%. 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  Patient has refused to take any statins. Continue antihypertensives. Of note, patient said she just wants to die and she is interested in hospice. She has refused to take most of her medicines except aspirin and Brilinta. Hospice team has been consulted. Monitor CBC  Body mass index is 32.54 kg/m.  (Morbid obesity)  Diet Order            Diet Carb Modified Fluid consistency: Thin; Room service appropriate? Yes  Diet effective now                       Medications:   . aspirin EC  81 mg Oral Daily  . azelastine  2 spray Each Nare BID  . busPIRone  10 mg Oral BID  . enoxaparin (LOVENOX) injection  40 mg Subcutaneous Q24H  . fluticasone  2 spray Each Nare BID  . insulin aspart  0-15 Units Subcutaneous TID WC  . levothyroxine  25 mcg Oral Q0600  . losartan  25 mg Oral Daily  . magnesium citrate  0.5 Bottle Oral Weekly  . metoprolol tartrate  100 mg Oral BID  . mirtazapine  22.5 mg Oral QHS  . olopatadine  1 drop Both Eyes BID  . pantoprazole   40 mg Oral Daily  . risperiDONE  2 mg Oral QHS  . sodium chloride flush  3 mL Intravenous Q12H  . ticagrelor  90 mg Oral BID  . vitamin B-12  1,000 mcg Oral Daily  . Vitamin D (Ergocalciferol)  50,000 Units Oral Q7 days   Continuous Infusions: . sodium chloride Stopped (01/25/20 1900)     Anti-infectives (From admission, onward)   None             Family Communication/Anticipated D/C date and plan/Code Status   DVT prophylaxis: enoxaparin (LOVENOX) injection 40 mg Start: 01/26/20 0800     Code Status: DNR  Family Communication: None Disposition Plan:    Status is: Inpatient  Remains inpatient appropriate because:Inpatient level of care appropriate due to severity of illness   Dispo: The patient is from: Home              Anticipated d/c is to: Home              Anticipated d/c date is: 1 day              Patient currently is not medically stable to d/c.           Subjective:   Patient said "I  just want to die. I want assisted suicide".  No chest pain or shortness of breath.  Objective:    Vitals:   01/25/20 2200 01/25/20 2300 01/26/20 0000 01/26/20 0100  BP: (!) 109/91 (!) 133/92 (!) 163/84   Pulse: 82 76 74 73  Resp: (!) 27 (!) 23 20 (!) 30  Temp:      TempSrc:      SpO2: 100% 97% 100% 100%  Weight:      Height:       No data found.   Intake/Output Summary (Last 24 hours) at 01/26/2020 0858 Last data filed at 01/26/2020 0300 Gross per 24 hour  Intake --  Output 950 ml  Net -950 ml   Filed Weights   01/25/20 1218  Weight: 80.7 kg    Exam:  GEN: NAD SKIN: Warm and dry EYES: EOMI, blind ENT: MMM CV: RRR PULM: CTA B ABD: soft, ND, NT, +BS CNS: AAO x 3, left facial palsy otherwise non focal EXT: No edema or tenderness. She has contracture of b/l hands and feet   Data Reviewed:   I have personally reviewed following labs and imaging studies:  Labs: Labs show the following:   Basic Metabolic Panel: Recent Labs  Lab  01/22/20 1906 01/22/20 1906 01/24/20 0859 01/24/20 0859 01/25/20 1029 01/26/20 0600  NA 138  --  134*  --  134* 130*  K 4.2   < > 4.4   < > 4.3 3.9  CL 98  --  100  --  98 94*  CO2 25  --  22  --  19* 22  GLUCOSE 161*  --  114*  --  137* 124*  BUN 14  --  13  --  17 18  CREATININE 0.89  --  0.80  --  1.10* 0.87  CALCIUM 9.8  --  8.9  --  9.3 9.2   < > = values in this interval not displayed.   GFR Estimated Creatinine Clearance: 69.3 mL/min (by C-G formula based on SCr of 0.87 mg/dL). Liver Function Tests: Recent Labs  Lab 01/22/20 1906  AST 17  ALT 12  ALKPHOS 86  BILITOT 0.6  PROT 8.7*  ALBUMIN 4.6   Recent Labs  Lab 01/22/20 1906  LIPASE 46   No results for input(s): AMMONIA in the last 168 hours. Coagulation profile Recent Labs  Lab 01/24/20 1315 01/25/20 1029  INR 1.0 1.0    CBC: Recent Labs  Lab 01/22/20 1906 01/24/20 0846 01/25/20 1029 01/26/20 0600  WBC 12.9* 14.5* 15.8* 24.4*  NEUTROABS  --   --  12.6*  --   HGB 11.6* 10.4* 9.9* 11.5*  HCT 38.0 32.7* 30.9* 36.9  MCV 72.5* 69.9* 70.1* 71.7*  PLT 477* 453* 480* 562*   Cardiac Enzymes: No results for input(s): CKTOTAL, CKMB, CKMBINDEX, TROPONINI in the last 168 hours. BNP (last 3 results) No results for input(s): PROBNP in the last 8760 hours. CBG: Recent Labs  Lab 01/25/20 1221 01/25/20 1609 01/26/20 0727  GLUCAP 111* 167* 117*   D-Dimer: No results for input(s): DDIMER in the last 72 hours. Hgb A1c: Recent Labs    01/25/20 1051  HGBA1C 6.1*   Lipid Profile: Recent Labs    01/26/20 0600  CHOL 310*  HDL 59  LDLCALC 197*  TRIG 269*  CHOLHDL 5.3   Thyroid function studies: No results for input(s): TSH, T4TOTAL, T3FREE, THYROIDAB in the last 72 hours.  Invalid input(s): FREET3 Anemia work up: No results for  input(s): VITAMINB12, FOLATE, FERRITIN, TIBC, IRON, RETICCTPCT in the last 72 hours. Sepsis Labs: Recent Labs  Lab 01/22/20 1906 01/24/20 0846 01/25/20 1029  01/26/20 0600  WBC 12.9* 14.5* 15.8* 24.4*    Microbiology Recent Results (from the past 240 hour(s))  SARS Coronavirus 2 by RT PCR (hospital order, performed in [facility] hospital lab) Nasopharyngeal Nasopharyngeal Swab     Status: None   Collection Time: 01/24/20 11:02 AM   Specimen: Nasopharyngeal Swab  Result Value Ref Range Status   SARS Coronavirus 2 NEGATIVE NEGATIVE Final    Comment: (NOTE) SARS-CoV-2 target nucleic acids are NOT DETECTED.  The SARS-CoV-2 RNA is generally detectable in upper and lower respiratory specimens during the acute phase of infection. The lowest concentration of SARS-CoV-2 viral copies this assay can detect is 250 copies / mL. A negative result does not preclude SARS-CoV-2 infection and should not be used as the sole basis for treatment or other patient management decisions.  A negative result may occur with improper specimen collection / handling, submission of specimen other than nasopharyngeal swab, presence of viral mutation(s) within the areas targeted by this assay, and inadequate number of viral copies (<250 copies / mL). A negative result must be combined with clinical observations, patient history, and epidemiological information.  Fact Sheet for Patients:   BoilerBrush.com.cy  Fact Sheet for Healthcare Providers: https://pope.com/  This test is not yet approved or  cleared by the [place] FDA and has been authorized for detection and/or diagnosis of SARS-CoV-2 by FDA under an Emergency Use Authorization (EUA).  This EUA will remain in effect (meaning this test can be used) for the duration of the COVID-19 declaration under Section 564(b)(1) of the Act, 21 U.S.C. section 360bbb-3(b)(1), unless the authorization is terminated or revoked sooner.  Performed at [facility], [address] [place], [place] [address]   MRSA PCR Screening     Status: Abnormal   Collection  Time: 01/25/20 12:24 PM   Specimen: Nasopharyngeal  Result Value Ref Range Status   MRSA by PCR POSITIVE (A) NEGATIVE Final    Comment:        The GeneXpert MRSA Assay (FDA approved for NASAL specimens only), is one component of a comprehensive MRSA colonization surveillance program. It is not intended to diagnose MRSA infection nor to guide or monitor treatment for MRSA infections. RESULT CALLED TO, READ BACK BY AND VERIFIED WITH: TIFFANY FAIRING AT 1336 01/25/20.PMF Performed at [facility], [address] [place], [place] [address]     Procedures and diagnostic studies:  CT Angio Chest PE W and/or Wo Contrast  Result Date: 01/24/2020 CLINICAL DATA:  59 year old with chest wall pain. Concern for pulmonary embolism. EXAM: CT ANGIOGRAPHY CHEST TECHNIQUE: Multidetector CT imaging through the chest using the standard protocol during bolus administration of intravenous contrast. Multiplanar reconstructed images and MIPs were obtained and reviewed to evaluate the vascular anatomy. CONTRAST:  70mL OMNIPAQUE IOHEXOL 350 MG/ML SOLN COMPARISON:  Chest radiograph 01/24/2020 FINDINGS: CTA CHEST FINDINGS Cardiovascular: Satisfactory opacification of the pulmonary arteries to the segmental level. No evidence of pulmonary embolism. Normal heart size. No pericardial effusion. Coronary artery calcifications. Normal caliber of the thoracic aorta with atherosclerotic calcifications. Mediastinum/Nodes: Small axillary lymph nodes. No significant mediastinal or hilar lymph node enlargement. Lungs/Pleura: Trachea and mainstem bronchi are patent. No large pleural effusions. Lungs are clear without airspace disease or consolidation. Upper abdomen: High-density structure in the gallbladder that measures roughly 1.1 cm and suggestive for gallstone. No acute abnormality  in the upper abdomen. Musculoskeletal: No acute bone abnormality. Review of the MIP images confirms the above findings. IMPRESSION: 1. No  acute chest abnormality.  No evidence for pulmonary embolism. 2. Aortic Atherosclerosis (ICD10-I70.0). Coronary artery calcifications. 3. Cholelithiasis. Electronically Signed   By: Richarda Overlie M.D.   On: 01/24/2020 12:10   CARDIAC CATHETERIZATION  Result Date: 01/25/2020  There is moderate to severe left ventricular systolic dysfunction.  LV end diastolic pressure is severely elevated.  Prox RCA lesion is 10% stenosed.  Mid RCA lesion is 50% stenosed.  Dist RCA lesion is 30% stenosed.  Prox LAD lesion is 20% stenosed.  Prox LAD to Mid LAD lesion is 90% stenosed.  Mid LAD to Dist LAD lesion is 20% stenosed.  Mid LAD-1 lesion is 20% stenosed.  Mid LAD-2 lesion is 70% stenosed.  2nd Diag lesion is 80% stenosed.  Prox Cx lesion is 40% stenosed.  Mid Cx lesion is 100% stenosed.  3rd Mrg lesion is 20% stenosed.  Post intervention, there is a 0% residual stenosis.  A drug-eluting stent was successfully placed using a STENT RESOLUTE ONYX 2.25X26.  Dist Cx lesion is 70% stenosed.  1.  Significant underlying three-vessel coronary artery disease with multiple stents in the LAD, left circumflex and right coronary artery.  The culprit for myocardial infarction is an occluded mid left circumflex at the mid to distal edge of the previously placed stent with diffuse disease extending into the stented segment of OM 3.  Patent LAD stents.  However, multiple areas of significant stenosis between the stented segments. 2.  Moderately to severely reduced LV systolic function with an EF of 30 to 35%.  Severely elevated left ventricular end-diastolic pressure at 32 mmHg. 3.  Successful angioplasty and drug-eluting stent placement to the mid left circumflex extending into OM 3 to overlap with the previously placed stent. Recommendations: Dual antiplatelet therapy for at least 1 year. Aggressive treatment of risk factors. The patient is likely not a candidate for further revascularization given her comorbidities and  desire for conservative approach. Please note that there was delay in bringing the patient to the Cath Lab given that I had to have a discussion with her about the risks and benefits of cardiac catheterization as she refused any further evaluation the day before and she left the hospital to pursue palliative care.  The patient agreed to have catheterization done when her chest pain worsened with no relief with narcotic medications.   DG Chest Portable 1 View  Result Date: 01/24/2020 CLINICAL DATA:  Chest pain for 5 days. EXAM: PORTABLE CHEST 1 VIEW COMPARISON:  01/22/2020 FINDINGS: Patient is rotated to the left. Low lung volumes are seen. Heart size is within normal limits. Aortic atherosclerosis noted. Both lungs are clear. IMPRESSION: Low lung volumes. No active disease. Electronically Signed   By: Danae Orleans M.D.   On: 01/24/2020 09:55               LOS: 1 day   Ayrton Mcvay  [facility]     01/26/2020, 8:58 AM

## 2020-01-26 NOTE — Progress Notes (Signed)
Progress Note  Patient Name: Victoria Medina Date of Encounter: 01/26/2020  Primary Cardiologist: Previously followed by [facility] cardiology, new to [facility] - consult by Kirke Corin  Subjective   Refusing most medications overnight except oxycodone and Klonipin. Due for ASA and Brilinta this morning. Post cath renal function normal. WBC 15.8-->24.4. HGB 9.9-->11.5. BP elevated this morning.   Inpatient Medications    Scheduled Meds: . aspirin EC  81 mg Oral Daily  . atorvastatin  80 mg Oral QPM  . azelastine  2 spray Each Nare BID  . busPIRone  10 mg Oral BID  . enoxaparin (LOVENOX) injection  40 mg Subcutaneous Q24H  . fluticasone  2 spray Each Nare BID  . insulin aspart  0-15 Units Subcutaneous TID WC  . levothyroxine  25 mcg Oral Q0600  . magnesium citrate  0.5 Bottle Oral Weekly  . metoprolol tartrate  100 mg Oral BID  . mirtazapine  22.5 mg Oral QHS  . olopatadine  1 drop Both Eyes BID  . pantoprazole  40 mg Oral Daily  . risperiDONE  2 mg Oral QHS  . sodium chloride flush  3 mL Intravenous Q12H  . ticagrelor  90 mg Oral BID  . vitamin B-12  1,000 mcg Oral Daily  . Vitamin D (Ergocalciferol)  50,000 Units Oral Q7 days   Continuous Infusions: . sodium chloride Stopped (01/25/20 1900)   PRN Meds: sodium chloride, acetaminophen, bisacodyl, clonazePAM, nitroGLYCERIN, ondansetron (ZOFRAN) IV, ondansetron, oxyCODONE, pseudoephedrine, sodium chloride flush, tiZANidine   Vital Signs    Vitals:   01/25/20 2200 01/25/20 2300 01/26/20 0000 01/26/20 0100  BP: (!) 109/91 (!) 133/92 (!) 163/84   Pulse: 82 76 74 73  Resp: (!) 27 (!) 23 20 (!) 30  Temp:      TempSrc:      SpO2: 100% 97% 100% 100%  Weight:      Height:        Intake/Output Summary (Last 24 hours) at 01/26/2020 0756 Last data filed at 01/26/2020 0300 Gross per 24 hour  Intake --  Output 950 ml  Net -950 ml   Filed Weights   01/25/20 1218  Weight: 80.7 kg    Telemetry    NSR, 74 bpm, prior inferior infarct,  anterolateral TWI  - Personally Reviewed  ECG    SR with rare PVCs - Personally Reviewed  Physical Exam   GEN: No acute distress.   Neck: No JVD. Cardiac: RRR, no murmurs, rubs, or gallops. Right radial cardiac cath site is normal with no hematoma.  Respiratory: Clear to auscultation bilaterally.  GI: Soft, nontender, non-distended.   MS: No edema; No deformity. Neuro:  Alert and oriented x 3; Nonfocal.  Psych: Normal affect.  Labs    Chemistry Recent Labs  Lab 01/22/20 1906 01/22/20 1906 01/24/20 0859 01/25/20 1029 01/26/20 0600  NA 138   < > 134* 134* 130*  K 4.2   < > 4.4 4.3 3.9  CL 98   < > 100 98 94*  CO2 25   < > 22 19* 22  GLUCOSE 161*   < > 114* 137* 124*  BUN 14   < > 13 17 18   CREATININE 0.89   < > 0.80 1.10* 0.87  CALCIUM 9.8   < > 8.9 9.3 9.2  PROT 8.7*  --   --   --   --   ALBUMIN 4.6  --   --   --   --   AST 17  --   --   --   --  ALT 12  --   --   --   --   ALKPHOS 86  --   --   --   --   BILITOT 0.6  --   --   --   --   GFRNONAA >60   < > >60 55* >60  GFRAA >60   < > >60 >60 >60  ANIONGAP 15   < > 12 17* 14   < > = values in this interval not displayed.     Hematology Recent Labs  Lab 01/24/20 0846 01/25/20 1029 01/26/20 0600  WBC 14.5* 15.8* 24.4*  RBC 4.68 4.41 5.15*  HGB 10.4* 9.9* 11.5*  HCT 32.7* 30.9* 36.9  MCV 69.9* 70.1* 71.7*  MCH 22.2* 22.4* 22.3*  MCHC 31.8 32.0 31.2  RDW 16.2* 16.2* 16.2*  PLT 453* 480* 562*    Cardiac EnzymesNo results for input(s): TROPONINI in the last 168 hours. No results for input(s): TROPIPOC in the last 168 hours.   BNP Recent Labs  Lab 01/22/20 1906  BNP 73.1     DDimer No results for input(s): DDIMER in the last 168 hours.   Radiology    CT Angio Chest PE W and/or Wo Contrast  Result Date: 01/24/2020 IMPRESSION: 1. No acute chest abnormality.  No evidence for pulmonary embolism. 2. Aortic Atherosclerosis (ICD10-I70.0). Coronary artery calcifications. 3. Cholelithiasis.  Electronically Signed   By: Markus Daft M.D.   On: 01/24/2020 12:10    DG Chest Portable 1 View  Result Date: 01/24/2020 IMPRESSION: Low lung volumes. No active disease. Electronically Signed   By: Marlaine Hind M.D.   On: 01/24/2020 09:55    Cardiac Studies   LHC 01/25/2020:  There is moderate to severe left ventricular systolic dysfunction.  LV end diastolic pressure is severely elevated.  Prox RCA lesion is 10% stenosed.  Mid RCA lesion is 50% stenosed.  Dist RCA lesion is 30% stenosed.  Prox LAD lesion is 20% stenosed.  Prox LAD to Mid LAD lesion is 90% stenosed.  Mid LAD to Dist LAD lesion is 20% stenosed.  Mid LAD-1 lesion is 20% stenosed.  Mid LAD-2 lesion is 70% stenosed.  2nd Diag lesion is 80% stenosed.  Prox Cx lesion is 40% stenosed.  Mid Cx lesion is 100% stenosed.  3rd Mrg lesion is 20% stenosed.  Post intervention, there is a 0% residual stenosis.  A drug-eluting stent was successfully placed using a STENT RESOLUTE ONYX 2.25X26.  Dist Cx lesion is 70% stenosed.   1.  Significant underlying three-vessel coronary artery disease with multiple stents in the LAD, left circumflex and right coronary artery.  The culprit for myocardial infarction is an occluded mid left circumflex at the mid to distal edge of the previously placed stent with diffuse disease extending into the stented segment of OM 3.  Patent LAD stents.  However, multiple areas of significant stenosis between the stented segments. 2.  Moderately to severely reduced LV systolic function with an EF of 30 to 35%.  Severely elevated left ventricular end-diastolic pressure at 32 mmHg. 3.  Successful angioplasty and drug-eluting stent placement to the mid left circumflex extending into OM 3 to overlap with the previously placed stent.  Recommendations: Dual antiplatelet therapy for at least 1 year. Aggressive treatment of risk factors. The patient is likely not a candidate for further  revascularization given her comorbidities and desire for conservative approach.  Please note that there was delay in bringing the patient to the Cath Lab given  that I had to have a discussion with her about the risks and benefits of cardiac catheterization as she refused any further evaluation the day before and she left the hospital to pursue palliative care.  The patient agreed to have catheterization done when her chest pain worsened with no relief with narcotic medications.  __________  2D echo pending  Patient Profile     59 y.o. female with history of CAD with extensive stenting in 2019, CVA with left-sided hemiplegia, HFrEF, HTN, and HLD who we are seeing for inferolateral STEMI.   Assessment & Plan    1. CAD involving the native coronary arteries with inferolateral STEMI: Status post PCI and drug-eluting stent placement to the mid left circumflex into OM 3.  Currently chest pain-free.  Unfortunately, the patient is refusing most of her medications.  I had a prolonged discussion with her and explained the risk of stent thrombosis.  She is agreeable to take dual antiplatelet therapy.  She has residual disease in the LAD but she is not a candidate for further revascularization as she is threatening to leave AMA again but she agreed to stay another day. Transfer to to 2 A with plans for discharge likely tomorrow.  2. HFrEF secondary to ICM: Continue metoprolol.  I added losartan.  Echocardiogram is pending.  3. Prior CVA: No new neurologic symptoms.  4. HTN: -Blood pressure elevated this morning -Give AM metoprolol .  I added losartan given low EF.  5. HLD: -The patient does not want to take any statins because she thinks it caused her diabetes to get out of control.  I could not convince her otherwise.  6.  Psychosocial: The patient does not seem to have trust in medical providers overall.  This would make her management extremely difficult.   7.  Leukocytosis: White cell count  gradually increased to 24,000.  This could be reactive due to myocardial infarction but underlying infection cannot be completely excluded.  Work-up per internal medicine.  Discussed plan of care with nursing.  For questions or updates, please contact [facility] Please consult [email or web address] for contact info under Cardiology/STEMI.    Signed, Lorine Bears, MD [facility] 01/26/2020, 7:56 AM

## 2020-01-27 ENCOUNTER — Telehealth: Payer: Self-pay | Admitting: Cardiovascular Disease

## 2020-01-27 DIAGNOSIS — I2119 ST elevation (STEMI) myocardial infarction involving other coronary artery of inferior wall: Principal | ICD-10-CM

## 2020-01-27 DIAGNOSIS — I5022 Chronic systolic (congestive) heart failure: Secondary | ICD-10-CM

## 2020-01-27 DIAGNOSIS — I25119 Atherosclerotic heart disease of native coronary artery with unspecified angina pectoris: Secondary | ICD-10-CM

## 2020-01-27 LAB — CBC WITH DIFFERENTIAL/PLATELET
Abs Immature Granulocytes: 0.11 10*3/uL — ABNORMAL HIGH (ref 0.00–0.07)
Basophils Absolute: 0 10*3/uL (ref 0.0–0.1)
Basophils Relative: 0 %
Eosinophils Absolute: 0.1 10*3/uL (ref 0.0–0.5)
Eosinophils Relative: 1 %
HCT: 37 % (ref 36.0–46.0)
Hemoglobin: 11.5 g/dL — ABNORMAL LOW (ref 12.0–15.0)
Immature Granulocytes: 1 %
Lymphocytes Relative: 13 %
Lymphs Abs: 1.8 10*3/uL (ref 0.7–4.0)
MCH: 22.2 pg — ABNORMAL LOW (ref 26.0–34.0)
MCHC: 31.1 g/dL (ref 30.0–36.0)
MCV: 71.6 fL — ABNORMAL LOW (ref 80.0–100.0)
Monocytes Absolute: 1.4 10*3/uL — ABNORMAL HIGH (ref 0.1–1.0)
Monocytes Relative: 10 %
Neutro Abs: 10.5 10*3/uL — ABNORMAL HIGH (ref 1.7–7.7)
Neutrophils Relative %: 75 %
Platelets: 473 10*3/uL — ABNORMAL HIGH (ref 150–400)
RBC: 5.17 MIL/uL — ABNORMAL HIGH (ref 3.87–5.11)
RDW: 16.3 % — ABNORMAL HIGH (ref 11.5–15.5)
WBC: 14 10*3/uL — ABNORMAL HIGH (ref 4.0–10.5)
nRBC: 0 % (ref 0.0–0.2)

## 2020-01-27 LAB — BASIC METABOLIC PANEL
Anion gap: 12 (ref 5–15)
BUN: 21 mg/dL — ABNORMAL HIGH (ref 6–20)
CO2: 25 mmol/L (ref 22–32)
Calcium: 8.8 mg/dL — ABNORMAL LOW (ref 8.9–10.3)
Chloride: 96 mmol/L — ABNORMAL LOW (ref 98–111)
Creatinine, Ser: 0.93 mg/dL (ref 0.44–1.00)
GFR calc Af Amer: >60 mL/min (ref >60)
GFR calc non Af Amer: >60 mL/min (ref >60)
Glucose, Bld: 134 mg/dL — ABNORMAL HIGH (ref 70–99)
Potassium: 3.3 mmol/L — ABNORMAL LOW (ref 3.5–5.1)
Sodium: 133 mmol/L — ABNORMAL LOW (ref 135–145)

## 2020-01-27 LAB — GLUCOSE, CAPILLARY: Glucose-Capillary: 133 mg/dL — ABNORMAL HIGH (ref 70–99)

## 2020-01-27 LAB — MAGNESIUM: Magnesium: 1.8 mg/dL (ref 1.7–2.4)

## 2020-01-27 MED ORDER — METOPROLOL TARTRATE 100 MG PO TABS: 100.0000 mg | ORAL_TABLET | Freq: Two times a day (BID) | ORAL | Status: DC

## 2020-01-27 MED ORDER — TICAGRELOR 90 MG PO TABS: 90.0000 mg | ORAL_TABLET | Freq: Two times a day (BID) | ORAL | Status: AC

## 2020-01-27 MED ORDER — POTASSIUM CHLORIDE CRYS ER 20 MEQ PO TBCR
40.0000 meq | EXTENDED_RELEASE_TABLET | Freq: Once | ORAL | Status: AC
  Administered 2020-01-27: 40 meq via ORAL
  Filled 2020-01-27: qty 2

## 2020-01-27 MED ORDER — CHLORHEXIDINE GLUCONATE CLOTH 2 % EX PADS: 6.0000 | MEDICATED_PAD | Freq: Every day | CUTANEOUS | Status: DC

## 2020-01-27 MED ORDER — CLONAZEPAM 0.5 MG PO TABS: 0.5000 mg | ORAL_TABLET | Freq: Three times a day (TID) | ORAL | 0 refills | Status: DC | PRN

## 2020-01-27 MED ORDER — LOSARTAN POTASSIUM 25 MG PO TABS: 25.0000 mg | ORAL_TABLET | Freq: Every day | ORAL | Status: DC

## 2020-01-27 MED ORDER — MUPIROCIN 2 % EX OINT
1.0000 "application " | TOPICAL_OINTMENT | Freq: Two times a day (BID) | CUTANEOUS | Status: DC
  Administered 2020-01-27: 1 via NASAL
  Filled 2020-01-27: qty 22

## 2020-01-27 NOTE — TOC Initial Note (Signed)
Transition of Care [facility]) - Initial/Assessment Note    Patient Details  Name: Victoria Medina MRN: [id number] Date of Birth: [birth year]  Transition of Care [facility]) CM/SW Contact:    Maree Krabbe, LCSW Phone Number: 01/27/2020, 1:36 PM  Clinical Narrative:  Pt is from [facility]. Pt will return. Facility is aware and prepared for pt.                 Expected Discharge Plan: Skilled Nursing Facility Barriers to Discharge: No Barriers Identified   Patient Goals and CMS Choice Patient states their goals for this hospitalization and ongoing recovery are:: to return to snf   Choice offered to / list presented to : Patient  Expected Discharge Plan and Services Expected Discharge Plan: Skilled Nursing Facility In-house Referral: Clinical Social Work   Post Acute Care Choice: Skilled Nursing Facility Living arrangements for the past 2 months: Skilled Nursing Facility Expected Discharge Date: 01/27/20                                    Prior Living Arrangements/Services Living arrangements for the past 2 months: Skilled Nursing Facility Lives with:: Self Patient language and need for interpreter reviewed:: Yes Do you feel safe going back to the place where you live?: Yes      Need for Family Participation in Patient Care: Yes (Comment) Care giver support system in place?: Yes (comment)   Criminal Activity/Legal Involvement Pertinent to Current Situation/Hospitalization: No - Comment as needed  Activities of Daily Living      Permission Sought/Granted Permission sought to share information with : Family Supports    Share Information with NAME: Publishing copy granted to share info w AGENCY: [facility]  Permission granted to share info w Relationship: mother     Emotional Assessment Appearance:: Appears stated age Attitude/Demeanor/Rapport: Engaged   Orientation: : Oriented to Self, Oriented to Place, Oriented to  Time, Oriented to Situation Alcohol / Substance  Use: Not Applicable Psych Involvement: No (comment)  Admission diagnosis:  ST elevation myocardial infarction (STEMI), unspecified artery (HCC) [I21.3] Type 2 diabetes mellitus without complication, unspecified whether long term insulin use (HCC) [E11.9] Acute ST elevation myocardial infarction (STEMI) of inferolateral wall (HCC) [I21.19] Patient Active Problem List   Diagnosis Date Noted  . Acute ST elevation myocardial infarction (STEMI) of inferolateral wall (HCC) 01/25/2020  . NSTEMI (non-ST elevated myocardial infarction) (HCC) 01/24/2020  . Stroke (HCC)   . Diabetes mellitus without complication (HCC)   . Hypertension   . Blind   . Hypothyroidism (acquired)   . Anxiety    PCP:  Karie Schwalbe, MD Pharmacy:   [facility] [place], [place] - [address] [place] [address] Phone: [phone] Fax: [phone]  [facility] [place], [place] - [address] [place] [address] Phone: [phone] Fax: [phone]     Social Determinants of Health (SDOH) Interventions    Readmission Risk Interventions No flowsheet data found.

## 2020-01-27 NOTE — Progress Notes (Addendum)
Progress Note  Patient Name: Victoria Medina Date of Encounter: 01/27/2020  Primary Cardiologist: New to [facility] - consult by Arida  Subjective   Transferred out of the ICU to 2A. No chest pain, dyspnea, palpitations, or dizziness. Pulled out her IV and refused to have this replaced. Refusing statin. Taking ASA and Brilinta. Tired, states she has not slept in a week. Wants to be discharged today.   Inpatient Medications    Scheduled Meds: . aspirin EC  81 mg Oral Daily  . azelastine  2 spray Each Nare BID  . busPIRone  10 mg Oral BID  . enoxaparin (LOVENOX) injection  40 mg Subcutaneous Q24H  . fluticasone  2 spray Each Nare BID  . insulin aspart  0-15 Units Subcutaneous TID WC  . levothyroxine  25 mcg Oral Q0600  . losartan  25 mg Oral Daily  . magnesium citrate  0.5 Bottle Oral Weekly  . metoprolol tartrate  100 mg Oral BID  . mirtazapine  22.5 mg Oral QHS  . olopatadine  1 drop Both Eyes BID  . pantoprazole  40 mg Oral Daily  . potassium chloride  40 mEq Oral Once  . risperiDONE  2 mg Oral QHS  . sodium chloride flush  3 mL Intravenous Q12H  . ticagrelor  90 mg Oral BID  . vitamin B-12  1,000 mcg Oral Daily  . Vitamin D (Ergocalciferol)  50,000 Units Oral Q7 days   Continuous Infusions: . sodium chloride Stopped (01/25/20 1900)   PRN Meds: sodium chloride, acetaminophen, bisacodyl, clonazePAM, nitroGLYCERIN, ondansetron (ZOFRAN) IV, ondansetron, oxyCODONE, pseudoephedrine, sodium chloride flush, tiZANidine   Vital Signs    Vitals:   01/26/20 1700 01/26/20 1800 01/26/20 2127 01/27/20 0454  BP:   119/74 108/72  Pulse: 76 71 73 73  Resp: 18 (!) 21    Temp:   98.4 F (36.9 C) 98.6 F (37 C)  TempSrc:   Oral Oral  SpO2: 100% 99% 100% 95%  Weight:    79.6 kg  Height:        Intake/Output Summary (Last 24 hours) at 01/27/2020 0746 Last data filed at 01/27/2020 0600 Gross per 24 hour  Intake 993 ml  Output 1200 ml  Net -207 ml   Filed Weights   01/25/20 1218  01/27/20 0454  Weight: 80.7 kg 79.6 kg    Telemetry    SR, 80s to 90s bpm - Personally Reviewed  ECG    No new tracings - Personally Reviewed  Physical Exam   GEN: No acute distress.   Neck: No JVD. Cardiac: RRR, no murmurs, rubs, or gallops. Femoral cath site soft with mild TTP. Respiratory: Clear to auscultation bilaterally.  GI: Soft, nontender, non-distended.   MS: No edema; No deformity. Neuro:  Alert and oriented x 3; Nonfocal.  Psych: Normal affect.  Labs    Chemistry Recent Labs  Lab 01/22/20 1906 01/24/20 0859 01/25/20 1029 01/26/20 0600 01/27/20 0559  NA 138   < > 134* 130* 133*  K 4.2   < > 4.3 3.9 3.3*  CL 98   < > 98 94* 96*  CO2 25   < > 19* 22 25  GLUCOSE 161*   < > 137* 124* 134*  BUN 14   < > 17 18 21*  CREATININE 0.89   < > 1.10* 0.87 0.93  CALCIUM 9.8   < > 9.3 9.2 8.8*  PROT 8.7*  --   --   --   --  ALBUMIN 4.6  --   --   --   --   AST 17  --   --   --   --   ALT 12  --   --   --   --   ALKPHOS 86  --   --   --   --   BILITOT 0.6  --   --   --   --   GFRNONAA >60   < > 55* >60 >60  GFRAA >60   < > >60 >60 >60  ANIONGAP 15   < > 17* 14 12   < > = values in this interval not displayed.     Hematology Recent Labs  Lab 01/25/20 1029 01/26/20 0600 01/27/20 0559  WBC 15.8* 24.4* 14.0*  RBC 4.41 5.15* 5.17*  HGB 9.9* 11.5* 11.5*  HCT 30.9* 36.9 37.0  MCV 70.1* 71.7* 71.6*  MCH 22.4* 22.3* 22.2*  MCHC 32.0 31.2 31.1  RDW 16.2* 16.2* 16.3*  PLT 480* 562* 473*    Cardiac EnzymesNo results for input(s): TROPONINI in the last 168 hours. No results for input(s): TROPIPOC in the last 168 hours.   BNP Recent Labs  Lab 01/22/20 1906  BNP 73.1     DDimer No results for input(s): DDIMER in the last 168 hours.   Radiology    CXR 01/24/2020: IMPRESSION: Low lung volumes. No active disease. __________  CTA chest 01/24/2020: IMPRESSION: 1. No acute chest abnormality.  No evidence for pulmonary embolism. 2. Aortic Atherosclerosis  (ICD10-I70.0). Coronary artery calcifications. 3. Cholelithiasis.   Cardiac Studies   LHC 01/25/2020:  There is moderate to severe left ventricular systolic dysfunction.  LV end diastolic pressure is severely elevated.  Prox RCA lesion is 10% stenosed.  Mid RCA lesion is 50% stenosed.  Dist RCA lesion is 30% stenosed.  Prox LAD lesion is 20% stenosed.  Prox LAD to Mid LAD lesion is 90% stenosed.  Mid LAD to Dist LAD lesion is 20% stenosed.  Mid LAD-1 lesion is 20% stenosed.  Mid LAD-2 lesion is 70% stenosed.  2nd Diag lesion is 80% stenosed.  Prox Cx lesion is 40% stenosed.  Mid Cx lesion is 100% stenosed.  3rd Mrg lesion is 20% stenosed.  Post intervention, there is a 0% residual stenosis.  A drug-eluting stent was successfully placed using a STENT RESOLUTE ONYX [id number].  Dist Cx lesion is 70% stenosed.  1. Significant underlying three-vessel coronary artery disease with multiple stents in the LAD, left circumflex and right coronary artery. The culprit for myocardial infarction is an occluded mid left circumflex at the mid to distal edge of the previously placed stent with diffuse disease extending into the stented segment of OM 3. Patent LAD stents. However, multiple areas of significant stenosis between the stented segments. 2. Moderately to severely reduced LV systolic function with an EF of 30 to 35%. Severely elevated left ventricular end-diastolic pressure at 32 mmHg. 3. Successful angioplasty and drug-eluting stent placement to the mid left circumflex extending into OM 3 to overlap with the previously placed stent.  Recommendations: Dual antiplatelet therapy for at least 1 year. Aggressive treatment of risk factors. The patient is likely not a candidate for further revascularization given her comorbidities and desire for conservative approach.  Please note that there was delay in bringing the patient to the Cath Lab given that I had to have a  discussion with her about the risks and benefits of cardiac catheterization as she refused any further evaluation the  day before and she left the hospital to pursue palliative care. The patient agreed to have catheterization done when her chest pain worsened with no relief with narcotic medications.  __________  2D echo 01/26/2020: 1. Left ventricular ejection fraction, by estimation, is 40 to 45%. The  left ventricle has mildly decreased function. The left ventricle  demonstrates regional wall motion abnormalities (see scoring  diagram/findings for description). There is mild left  ventricular hypertrophy of the basal-septal segment. Left ventricular  diastolic parameters were normal. There is moderate hypokinesis of the  left ventricular, entire inferior wall and inferolateral wall.  2. Right ventricular systolic function is normal. The right ventricular  size is normal. Tricuspid regurgitation signal is inadequate for assessing  PA pressure.  3. The mitral valve is normal in structure. Trivial mitral valve  regurgitation. No evidence of mitral stenosis.  4. The aortic valve is normal in structure. Aortic valve regurgitation is  not visualized. No aortic stenosis is present.  5. The inferior vena cava is normal in size with greater than 50%  respiratory variability, suggesting right atrial pressure of 3 mmHg.   Patient Profile     59 y.o. female with history of CAD with extensive stenting in 2019, CVA with left-sided hemiplegia, HFrEF, HTN, and HLD who we are seeing for inferolateral STEMI.  Assessment & Plan    1. CAD involving the native coronary arteries with inferolateral STEMI: -No further chest pain -Status post PCI/DES tot he mid LCx into the OM3 -DAPT without interruption with ASA and Brilinta for at least the next 12 months -Residual LAD disease has been medically managed at this time as she is without chest pain and has threatened to leave AMA multiple times, has  pulled out her IV and refused replacement, and indicates she "is going home today" -Metoprolol  -Losartan  -Refused statins as below  -Post cath instructions -Cardiac rehab -Patient will need follow up in our office in 7-14 days (I will send a message) -Ok for discharge from our perspective   2. HFrEF secondary to ICM: -She appears euvolemic and well compensated -Echo as above -Continue metoprolol and losartan -In follow up, escalate GDMT as able/if patient will allow, including addition of MRA or transition to [facility]   -CHF education   3. Prior CVA: -No new symptoms  4. HTN: -Blood pressure well controlled -Continue current therapy as above  5. HLD: -LDL 197 this admission with a goal LDL < 70 -Refusing statins despite long discussion -Outpatient follow up to discuss possible Zetia vs PCSK-9 inhibitor   6. Leukocytosis: -Improving -No obvious signs of infection -Likely inflammatory in the setting of her MI -Follow up with PCP as outpatient   7. Psychosocial: -She seems to have a significant distrust in the medical community which complicates her overall care, especially in the setting of her comorbid conditions   For questions or updates, please contact [facility] Please consult [email or web address] for contact info under Cardiology/STEMI.    Signed, Eula Listen, PA-C [facility] Pager: [phone] 01/27/2020, 7:46 AM

## 2020-01-27 NOTE — Progress Notes (Signed)
Patient refused to have 1200 blood sugar checked.

## 2020-01-27 NOTE — [place] FL2 (Signed)
[place] MEDICAID FL2 LEVEL OF CARE SCREENING TOOL     IDENTIFICATION  Patient Name: Victoria Medina Birthdate: [birth year] Sex: female Admission Date (Current Location): 01/25/2020  [place] and [place] Number:  Chiropodist and Address:  [facility], [address], [place], [place] [address]      Provider Number: [id number]  Attending Physician Name and Address:  Lurene Shadow, MD  Relative Name and Phone Number:       Current Level of Care: Hospital Recommended Level of Care: Skilled Nursing Facility Prior Approval Number:    Date Approved/Denied:   PASRR Number:    Discharge Plan: SNF    Current Diagnoses: Patient Active Problem List   Diagnosis Date Noted  . Acute ST elevation myocardial infarction (STEMI) of inferolateral wall (HCC) 01/25/2020  . NSTEMI (non-ST elevated myocardial infarction) (HCC) 01/24/2020  . Stroke (HCC)   . Diabetes mellitus without complication (HCC)   . Hypertension   . Blind   . Hypothyroidism (acquired)   . Anxiety     Orientation RESPIRATION BLADDER Height & Weight     Self, Time, Situation, Place  Normal External catheter, Incontinent (placed 6/26) Weight: 175 lb 8 oz (79.6 kg) Height:  5\' 2"  (157.5 cm)  BEHAVIORAL SYMPTOMS/MOOD NEUROLOGICAL BOWEL NUTRITION STATUS      Incontinent Diet (regular diet, thin liquids)  AMBULATORY STATUS COMMUNICATION OF NEEDS Skin   Limited Assist Verbally Other (Comment) (closed incision right arm)                       Personal Care Assistance Level of Assistance  Dressing, Feeding, Bathing Bathing Assistance: Limited assistance Feeding assistance: Independent Dressing Assistance: Limited assistance     Functional Limitations Info  Sight, Hearing, Speech Sight Info: Adequate Hearing Info: Adequate Speech Info: Adequate    SPECIAL CARE FACTORS FREQUENCY  PT (By licensed PT), OT (By licensed OT)     PT Frequency: 5x OT Frequency: 5x             Contractures Contractures Info: Not present    Additional Factors Info  Code Status, Allergies Code Status Info: DNR Allergies Info: Levaquin (Levofloxacin In D5w), Iodinated Diagnostic Agents, Latex           Current Medications (01/27/2020):  This is the current hospital active medication list Current Facility-Administered Medications  Medication Dose Route Frequency Provider Last Rate Last Admin  . 0.9 %  sodium chloride infusion  250 mL Intravenous PRN 01/29/2020, MD   Stopping Infusion hung by another clincian at 01/25/20 1900  . acetaminophen (TYLENOL) tablet 650 mg  650 mg Oral Q4H PRN 01/27/20, MD      . aspirin EC tablet 81 mg  81 mg Oral Daily Iran Ouch A, MD   81 mg at 01/27/20 0825  . azelastine (ASTELIN) 0.1 % nasal spray 2 spray  2 spray Each Nare BID 01/29/20 A, MD      . bisacodyl (DULCOLAX) suppository 10 mg  10 mg Rectal PRN Lorine Bears, MD      . busPIRone (BUSPAR) tablet 10 mg  10 mg Oral BID Iran Ouch A, MD      . Chlorhexidine Gluconate Cloth 2 % PADS 6 each  6 each Topical Q0600 Lorine Bears, MD      . clonazePAM Lurene Shadow) tablet 0.5 mg  0.5 mg Oral TID PRN Scarlette Calico, MD   0.5 mg at 01/26/20 2041  .  enoxaparin (LOVENOX) injection 40 mg  40 mg Subcutaneous Q24H Arida, Muhammad A, MD      . fluticasone (FLONASE) 50 MCG/ACT nasal spray 2 spray  2 spray Each Nare BID Iran Ouch, MD   2 spray at 01/27/20 0825  . insulin aspart (novoLOG) injection 0-15 Units  0-15 Units Subcutaneous TID WC Agbata, Tochukwu, MD   3 Units at 01/25/20 1652  . levothyroxine (SYNTHROID) tablet 25 mcg  25 mcg Oral Q0600 Iran Ouch, MD   25 mcg at 01/27/20 [phone]  . losartan (COZAAR) tablet 25 mg  25 mg Oral Daily Arida, Muhammad A, MD      . magnesium citrate solution 0.5 Bottle  0.5 Bottle Oral Weekly Arida, Muhammad A, MD      . metoprolol tartrate (LOPRESSOR) tablet 100 mg  100 mg Oral BID Lorine Bears A, MD   100 mg at  01/27/20 0824  . mirtazapine (REMERON) tablet 22.5 mg  22.5 mg Oral QHS Lorine Bears A, MD      . mupirocin ointment (BACTROBAN) 2 % 1 application  1 application Nasal BID Lurene Shadow, MD   1 application at 01/27/20 1203  . nitroGLYCERIN (NITROSTAT) SL tablet 0.4 mg  0.4 mg Sublingual Q5 Min x 3 PRN Lorine Bears A, MD      . olopatadine (PATANOL) 0.1 % ophthalmic solution 1 drop  1 drop Both Eyes BID Iran Ouch, MD   1 drop at 01/27/20 0828  . ondansetron (ZOFRAN) injection 4 mg  4 mg Intravenous Q6H PRN Lorine Bears A, MD      . ondansetron (ZOFRAN) tablet 4 mg  4 mg Oral Q8H PRN Lorine Bears A, MD      . oxyCODONE (Oxy IR/ROXICODONE) immediate release tablet 5 mg  5 mg Oral Q6H PRN Iran Ouch, MD   5 mg at 01/26/20 0046  . pantoprazole (PROTONIX) EC tablet 40 mg  40 mg Oral Daily Lorine Bears A, MD   40 mg at 01/27/20 0825  . pseudoephedrine (SUDAFED) tablet 30 mg  30 mg Oral Q6H PRN Lorine Bears A, MD      . risperiDONE (RISPERDAL) tablet 2 mg  2 mg Oral QHS Arida, Muhammad A, MD      . sodium chloride flush (NS) 0.9 % injection 3 mL  3 mL Intravenous Q12H Iran Ouch, MD   3 mL at 01/27/20 0829  . sodium chloride flush (NS) 0.9 % injection 3 mL  3 mL Intravenous PRN Lorine Bears A, MD      . ticagrelor (BRILINTA) tablet 90 mg  90 mg Oral BID Lorine Bears A, MD   90 mg at 01/27/20 0824  . tiZANidine (ZANAFLEX) tablet 2 mg  2 mg Oral TID PRN Iran Ouch, MD      . vitamin B-12 (CYANOCOBALAMIN) tablet 1,000 mcg  1,000 mcg Oral Daily Iran Ouch, MD   1,000 mcg at 01/27/20 0824  . Vitamin D (Ergocalciferol) (DRISDOL) capsule 50,000 Units  50,000 Units Oral Q7 days Iran Ouch, MD         Discharge Medications: Please see discharge summary for a list of discharge medications.  Relevant Imaging Results:  Relevant Lab Results:   Additional Information SSN:[id number]  Reuel Boom Darleny Sem, LCSW

## 2020-01-27 NOTE — Telephone Encounter (Signed)
-----   Message from Sondra Barges, PA-C sent at 01/27/2020  8:46 AM EDT ----- She will need TCM follow up with Dr. Kirke Corin or an APP in 7-14 days. Thanks

## 2020-01-27 NOTE — Progress Notes (Signed)
Victoria Medina to be D/C'd Skilled nursing facility per MD order.  Discussed prescriptions and follow up appointments with the shelia RN at [facility]. Prescription placed in packet. medication list explained in detail with RN at facility. Allergies as of 01/27/2020      Reactions   Levaquin [levofloxacin In D5w] Anaphylaxis, Swelling   Iodinated Diagnostic Agents Itching   Severe itching   Latex Dermatitis      Medication List    STOP taking these medications   atorvastatin 20 MG tablet Commonly known as: LIPITOR   busPIRone 10 MG tablet Commonly known as: BUSPAR   diazepam 10 MG tablet Commonly known as: VALIUM   fluticasone 50 MCG/ACT nasal spray Commonly known as: FLONASE   ibuprofen 200 MG tablet Commonly known as: ADVIL   mirtazapine 15 MG tablet Commonly known as: REMERON   oxyCODONE 5 MG immediate release tablet Commonly known as: Roxicodone   pseudoephedrine 30 MG tablet Commonly known as: SUDAFED   risperiDONE 2 MG tablet Commonly known as: RISPERDAL     TAKE these medications   aspirin EC 81 MG tablet Take 81 mg by mouth daily. Swallow whole.   Azelastine HCl 0.15 % Soln Place 2 sprays into both nostrils 2 (two) times daily.   bisacodyl 10 MG suppository Commonly known as: DULCOLAX Place 10 mg rectally as needed for moderate constipation.   clonazePAM 0.5 MG tablet Commonly known as: KLONOPIN Take 1 tablet (0.5 mg total) by mouth every 8 (eight) hours as needed for anxiety. *limit 3 doses per 24 hour* What changed: medication strength   lansoprazole 15 MG capsule Commonly known as: PREVACID Take 15 mg by mouth daily at 12 noon.   levothyroxine 25 MCG tablet Commonly known as: SYNTHROID Take 25 mcg by mouth daily before breakfast.   losartan 25 MG tablet Commonly known as: COZAAR Take 1 tablet (25 mg total) by mouth daily. Start taking on: January 28, 2020   magnesium citrate Soln Take 0.5 Bottles by mouth once a week.   metFORMIN 500 MG  tablet Commonly known as: GLUCOPHAGE Take 500 mg by mouth 2 (two) times daily.   metoprolol tartrate 100 MG tablet Commonly known as: LOPRESSOR Take 100 mg by mouth 2 (two) times daily.   nitroGLYCERIN 0.4 MG SL tablet Commonly known as: NITROSTAT Place 0.4 mg under the tongue every 5 (five) minutes x 3 doses as needed for chest pain.   Olopatadine HCl 0.2 % Soln Place 1 drop into both eyes daily at 6 (six) AM.   ondansetron 4 MG tablet Commonly known as: ZOFRAN Take 4 mg by mouth every 8 (eight) hours as needed for nausea or vomiting.   ticagrelor 90 MG Tabs tablet Commonly known as: BRILINTA Take 1 tablet (90 mg total) by mouth 2 (two) times daily.   tiZANidine 2 MG tablet Commonly known as: ZANAFLEX Take 2 mg by mouth 3 (three) times daily as needed for muscle spasms.   vitamin B-12 1000 MCG tablet Commonly known as: CYANOCOBALAMIN Take 1,000 mcg by mouth daily.   Vitamin D (Ergocalciferol) 1.25 MG (50000 UNIT) Caps capsule Commonly known as: DRISDOL Take 50,000 Units by mouth every 7 (seven) days.            Discharge Care Instructions  (From admission, onward)         Start     Ordered   01/27/20 0000  Discharge wound care:       Comments: Keep area clean and dry  01/27/20 1154          Vitals:   01/27/20 0808 01/27/20 1206  BP: 125/87 113/79  Pulse: 90 65  Resp: 18 18  Temp: 98.3 F (36.8 C)   SpO2: 100% 98%    Skin clean, dry and intact without evidence of skin break down, no evidence of skin tears noted. Cath site clean, no bleeding noted.  Pt denies pain at this time. No complaints noted.  An After Visit Summary was printed and placed in packet. Waiting for transport to [facility]   [employer]

## 2020-01-27 NOTE — Telephone Encounter (Signed)
TOC- patient is being discharged today. Patient to be called 01/28/20.

## 2020-01-27 NOTE — Progress Notes (Signed)
Waking pt up for VS, noticed that the pt's IV was out. Discussed with pt that the IV needs to be put back in . Pt stated that she does not want the IV to be put back in because she is leaving today. Attempted to educate pt as to why needing an IV on this floor. Pt still continue to say that she is leaving today and that she does not want to be stuck again. Notified NP. Will pass on to first shift.

## 2020-01-27 NOTE — Plan of Care (Signed)
  Problem: Cardiovascular: Goal: Vascular access site(s) Level 0-1 will be maintained Outcome: Progressing   

## 2020-01-27 NOTE — Progress Notes (Addendum)
Ems on floor to transport patient to [facility]. Packet was given to ems

## 2020-01-27 NOTE — Discharge Summary (Addendum)
Physician Discharge Summary  Victoria Medina ZOX:[id number] DOB: 10-24-1960 DOA: 01/25/2020  PCP: Victoria Schwalbe, MD  Admit date: 01/25/2020 Discharge date: 01/27/2020  Discharge disposition: Skilled nursing facility   Recommendations for Outpatient Follow-Up:   1. Outpatient follow-up with cardiologist in 7 to 10 days 2. Outpatient follow-up cardiac rehab as scheduled   Discharge Diagnosis:   Principal Problem:   Acute ST elevation myocardial infarction (STEMI) of inferolateral wall (HCC) Active Problems:   Stroke (HCC)   Diabetes mellitus without complication (HCC)   Hypertension   Blind   Hypothyroidism (acquired)    Discharge Condition: Stable.  Diet recommendation:  Diet Order            Diet - low sodium heart healthy           Diet Carb Modified                     Code Status: DNR     Hospital Course:   [name] is a 59 y.o. 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  When she presented to the ED the first time, she signed out AGAINST MEDICAL ADVICE.  She complained of the second time when she was found to have acute ST elevation MI.  Code STEMI was called and patient was sent to the Cath Lab for emergent left heart catheterization. A drug-eluting stent was placed in the mid left circumflex.  EF by cath was 30 to 35%. Dual antiplatelet therapy with aspirin and Brilinta was recommended.  Metoprolol and losartan have been added as well.  Patient refuses to take any statins despite explanation of benefits because she said she had side effects from it in the past.    Patient had indicated several times that she just wanted to die and that she did not want to live anymore.  Palliative  care/hospice team was notified.  She will be followed up in the outpatient setting.  Her condition has improved.  Chest pain has resolved and she is deemed stable for discharge to SNF today.  From cardiologist standpoint, she is okay for discharge.       Discharge Exam:   Vitals:   01/27/20 0808 01/27/20 1206  BP: 125/87 113/79  Pulse: 90 65  Resp: 18 18  Temp: 98.3 F (36.8 C)   SpO2: 100% 98%   Vitals:   01/26/20 2127 01/27/20 0454 01/27/20 0808 01/27/20 1206  BP: 119/74 108/72 125/87 113/79  Pulse: 73 73 90 65  Resp:   18 18  Temp: 98.4 F (36.9 C) 98.6 F (37 C) 98.3 F (36.8 C)   TempSrc: Oral Oral Oral   SpO2: 100% 95% 100% 98%  Weight:  79.6 kg    Height:         GEN: NAD SKIN: Warm and dry EYES: EOMI, blind ENT: MMM CV: RRR PULM: CTA B ABD: soft, ND, NT, +BS CNS: AAO x 3, left facial palsy. EXT: No edema or tenderness.  She has contractures of bilateral hands and feet.   The results of significant diagnostics from this hospitalization (including imaging, microbiology, ancillary and laboratory) are listed below for reference.     Procedures and Diagnostic Studies:   ECHOCARDIOGRAM COMPLETE  Result Date: 01/26/2020    ECHOCARDIOGRAM  REPORT   Patient Name:   Victoria Medina Date of Exam: 01/26/2020 Medical Rec #:  [id number]    Height:       62.0 in Accession #:    [id number]   Weight:       177.9 lb Date of Birth:  1960-08-27   BSA:          1.819 m Patient Age:    58 years     BP:           163/84 mmHg Patient Gender: F            HR:           73 bpm. Exam Location:  [facility] Procedure: 2D Echo, Color Doppler and Cardiac Doppler Indications:     Acute myocardial infarction  History:         Patient has no prior history of Echocardiogram examinations.                  Prior CABG, Stroke; Risk Factors:Hypertension, Diabetes and                  Dyslipidemia.  Sonographer:     Humphrey Rolls RDCS (AE) Referring Phys:  [id number] Diagnosing Phys: Lorine Bears  MD  Sonographer Comments: Suboptimal parasternal window. IMPRESSIONS  1. Left ventricular ejection fraction, by estimation, is 40 to 45%. The left ventricle has mildly decreased function. The left ventricle demonstrates regional wall motion abnormalities (see scoring diagram/findings for description). There is mild left ventricular hypertrophy of the basal-septal segment. Left ventricular diastolic parameters were normal. There is moderate hypokinesis of the left ventricular, entire inferior wall and inferolateral wall.  2. Right ventricular systolic function is normal. The right ventricular size is normal. Tricuspid regurgitation signal is inadequate for assessing PA pressure.  3. The mitral valve is normal in structure. Trivial mitral valve regurgitation. No evidence of mitral stenosis.  4. The aortic valve is normal in structure. Aortic valve regurgitation is not visualized. No aortic stenosis is present.  5. The inferior vena cava is normal in size with greater than 50% respiratory variability, suggesting right atrial pressure of 3 mmHg. FINDINGS  Left Ventricle: Left ventricular ejection fraction, by estimation, is 40 to 45%. The left ventricle has mildly decreased function. The left ventricle demonstrates regional wall motion abnormalities. Moderate hypokinesis of the left ventricular, entire inferior wall and inferolateral wall. The left ventricular internal cavity size was normal in size. There is mild left ventricular hypertrophy of the basal-septal segment. Left ventricular diastolic parameters were normal. Right Ventricle: The right ventricular size is normal. No increase in right ventricular wall thickness. Right ventricular systolic function is normal. Tricuspid regurgitation signal is inadequate for assessing PA pressure. Left Atrium: Left atrial size was normal in size. Right Atrium: Right atrial size was normal in size. Pericardium: There is no evidence of pericardial effusion. Mitral Valve: The  mitral valve is normal in structure. Normal mobility of the mitral valve leaflets. Trivial mitral valve regurgitation. No evidence of mitral valve stenosis. MV peak gradient, 2.4 mmHg. The mean mitral valve gradient is 1.0 mmHg. Tricuspid Valve: The tricuspid valve is normal in structure. Tricuspid valve regurgitation is not demonstrated. No evidence of tricuspid stenosis. Aortic Valve: The aortic valve is normal in structure. Aortic valve regurgitation is not visualized. No aortic stenosis is present. Aortic valve mean gradient measures 2.0 mmHg. Aortic valve peak gradient measures 3.5 mmHg. Aortic valve area, by VTI measures 1.70 cm. Pulmonic Valve:  The pulmonic valve was normal in structure. Pulmonic valve regurgitation is trivial. No evidence of pulmonic stenosis. Aorta: The aortic root is normal in size and structure. Venous: The inferior vena cava is normal in size with greater than 50% respiratory variability, suggesting right atrial pressure of 3 mmHg. IAS/Shunts: No atrial level shunt detected by color flow Doppler.  LEFT VENTRICLE PLAX 2D LVIDd:         4.04 cm  Diastology LVIDs:         3.03 cm  LV e' lateral:   4.24 cm/s LV PW:         1.16 cm  LV E/e' lateral: 13.0 LV IVS:        0.93 cm  LV e' medial:    6.64 cm/s LVOT diam:     1.90 cm  LV E/e' medial:  8.3 LV SV:         31 LV SV Index:   17 LVOT Area:     2.84 cm  LEFT ATRIUM             Index LA diam:        2.40 cm 1.32 cm/m LA Vol (A2C):   20.8 ml 11.44 ml/m LA Vol (A4C):   26.4 ml 14.51 ml/m LA Biplane Vol: 25.4 ml 13.96 ml/m  AORTIC VALVE                   PULMONIC VALVE AV Area (Vmax):    2.05 cm    PV Vmax:       0.96 m/s AV Area (Vmean):   1.90 cm    PV Vmean:      65.600 cm/s AV Area (VTI):     1.70 cm    PV VTI:        0.190 m AV Vmax:           94.00 cm/s  PV Peak grad:  3.7 mmHg AV Vmean:          67.200 cm/s PV Mean grad:  2.0 mmHg AV VTI:            0.184 m AV Peak Grad:      3.5 mmHg AV Mean Grad:      2.0 mmHg LVOT Vmax:          67.90 cm/s LVOT Vmean:        45.100 cm/s LVOT VTI:          0.110 m LVOT/AV VTI ratio: 0.60  AORTA Ao Root diam: 3.10 cm MITRAL VALVE MV Area (PHT): 4.17 cm    SHUNTS MV Peak grad:  2.4 mmHg    Systemic VTI:  0.11 m MV Mean grad:  1.0 mmHg    Systemic Diam: 1.90 cm MV Vmax:       0.77 m/s MV Vmean:      45.5 cm/s MV Decel Time: 182 msec MV E velocity: 55.30 cm/s MV A velocity: 66.80 cm/s MV E/A ratio:  0.83 Lorine Bears MD Electronically signed by Lorine Bears MD Signature Date/Time: 01/26/2020/12:17:09 PM    Final      Labs:   Basic Metabolic Panel: Recent Labs  Lab 01/22/20 1906 01/22/20 1906 01/24/20 0859 01/24/20 0859 01/25/20 1029 01/25/20 1029 01/26/20 0600 01/27/20 0559  NA 138  --  134*  --  134*  --  130* 133*  K 4.2   < > 4.4   < > 4.3   < > 3.9 3.3*  CL 98  --  100  --  98  --  94* 96*  CO2 25  --  22  --  19*  --  22 25  GLUCOSE 161*  --  114*  --  137*  --  124* 134*  BUN 14  --  13  --  17  --  18 21*  CREATININE 0.89  --  0.80  --  1.10*  --  0.87 0.93  CALCIUM 9.8  --  8.9  --  9.3  --  9.2 8.8*  MG  --   --   --   --   --   --   --  1.8   < > = values in this interval not displayed.   GFR Estimated Creatinine Clearance: 64.4 mL/min (by C-G formula based on SCr of 0.93 mg/dL). Liver Function Tests: Recent Labs  Lab 01/22/20 1906  AST 17  ALT 12  ALKPHOS 86  BILITOT 0.6  PROT 8.7*  ALBUMIN 4.6   Recent Labs  Lab 01/22/20 1906  LIPASE 46   No results for input(s): AMMONIA in the last 168 hours. Coagulation profile Recent Labs  Lab 01/24/20 1315 01/25/20 1029  INR 1.0 1.0    CBC: Recent Labs  Lab 01/22/20 1906 01/24/20 0846 01/25/20 1029 01/26/20 0600 01/27/20 0559  WBC 12.9* 14.5* 15.8* 24.4* 14.0*  NEUTROABS  --   --  12.6*  --  10.5*  HGB 11.6* 10.4* 9.9* 11.5* 11.5*  HCT 38.0 32.7* 30.9* 36.9 37.0  MCV 72.5* 69.9* 70.1* 71.7* 71.6*  PLT 477* 453* 480* 562* 473*   Cardiac Enzymes: No results for input(s): CKTOTAL, CKMB,  CKMBINDEX, TROPONINI in the last 168 hours. BNP: Invalid input(s): POCBNP CBG: Recent Labs  Lab 01/26/20 0727 01/26/20 1151 01/26/20 1557 01/26/20 1938 01/27/20 0809  GLUCAP 117* 210* 130* 138* 133*   D-Dimer No results for input(s): DDIMER in the last 72 hours. Hgb A1c Recent Labs    01/25/20 1051  HGBA1C 6.1*   Lipid Profile Recent Labs    01/26/20 0600  CHOL 310*  HDL 59  LDLCALC 197*  TRIG 269*  CHOLHDL 5.3   Thyroid function studies No results for input(s): TSH, T4TOTAL, T3FREE, THYROIDAB in the last 72 hours.  Invalid input(s): FREET3 Anemia work up No results for input(s): VITAMINB12, FOLATE, FERRITIN, TIBC, IRON, RETICCTPCT in the last 72 hours. Microbiology Recent Results (from the past 240 hour(s))  SARS Coronavirus 2 by RT PCR (hospital order, performed in [facility] hospital lab) Nasopharyngeal Nasopharyngeal Swab     Status: None   Collection Time: 01/24/20 11:02 AM   Specimen: Nasopharyngeal Swab  Result Value Ref Range Status   SARS Coronavirus 2 NEGATIVE NEGATIVE Final    Comment: (NOTE) SARS-CoV-2 target nucleic acids are NOT DETECTED.  The SARS-CoV-2 RNA is generally detectable in upper and lower respiratory specimens during the acute phase of infection. The lowest concentration of SARS-CoV-2 viral copies this assay can detect is 250 copies / mL. A negative result does not preclude SARS-CoV-2 infection and should not be used as the sole basis for treatment or other patient management decisions.  A negative result may occur with improper specimen collection / handling, submission of specimen other than nasopharyngeal swab, presence of viral mutation(s) within the areas targeted by this assay, and inadequate number of viral copies (<250 copies / mL). A negative result must be combined with clinical observations, patient history, and epidemiological information.  Fact Sheet for Patients:   BoilerBrush.com.cy  Fact  Sheet for Healthcare Providers: https://pope.com/  This test is not yet approved or  cleared by the [place] and has been authorized for detection and/or diagnosis of SARS-CoV-2 by FDA under an Emergency Use Authorization (EUA).  This EUA will remain in effect (meaning this test can be used) for the duration of the COVID-19 declaration under Section 564(b)(1) of the Act, 21 U.S.C. section 360bbb-3(b)(1), unless the authorization is terminated or revoked sooner.  Performed at [facility], [address] [place], [place] [address]   MRSA PCR Screening     Status: Abnormal   Collection Time: 01/25/20 12:24 PM   Specimen: Nasopharyngeal  Result Value Ref Range Status   MRSA by PCR POSITIVE (A) NEGATIVE Final    Comment:        The GeneXpert MRSA Assay (FDA approved for NASAL specimens only), is one component of a comprehensive MRSA colonization surveillance program. It is not intended to diagnose MRSA infection nor to guide or monitor treatment for MRSA infections. RESULT CALLED TO, READ BACK BY AND VERIFIED WITH: TIFFANY FAIRING AT 1336 01/25/20.PMF Performed at [facility], [address] [place], [place] [address]      Discharge Instructions:   Discharge Instructions    AMB Referral to Cardiac Rehabilitation - Phase II   Complete by: As directed    Diagnosis:  STEMI Coronary Stents     After initial evaluation and assessments completed: Virtual Based Care may be provided alone or in conjunction with Phase 2 Cardiac Rehab based on patient barriers.: Yes   Diet - low sodium heart healthy   Complete by: As directed    Diet Carb Modified   Complete by: As directed    Discharge wound care:   Complete by: As directed    Keep area clean and dry   Increase activity slowly   Complete by: As directed      Allergies as of 01/27/2020      Reactions   Levaquin [levofloxacin In D5w] Anaphylaxis, Swelling   Iodinated  Diagnostic Agents Itching   Severe itching   Latex Dermatitis      Medication List    STOP taking these medications   atorvastatin 20 MG tablet Commonly known as: LIPITOR   busPIRone 10 MG tablet Commonly known as: BUSPAR   diazepam 10 MG tablet Commonly known as: VALIUM   fluticasone 50 MCG/ACT nasal spray Commonly known as: FLONASE   ibuprofen 200 MG tablet Commonly known as: ADVIL   mirtazapine 15 MG tablet Commonly known as: REMERON   oxyCODONE 5 MG immediate release tablet Commonly known as: Roxicodone   pseudoephedrine 30 MG tablet Commonly known as: SUDAFED   risperiDONE 2 MG tablet Commonly known as: RISPERDAL     TAKE these medications   aspirin EC 81 MG tablet Take 81 mg by mouth daily. Swallow whole.   Azelastine HCl 0.15 % Soln Place 2 sprays into both nostrils 2 (two) times daily.   bisacodyl 10 MG suppository Commonly known as: DULCOLAX Place 10 mg rectally as needed for moderate constipation.   clonazePAM 0.5 MG tablet Commonly known as: KLONOPIN Take 1 tablet (0.5 mg total) by mouth every 8 (eight) hours as needed for anxiety. *limit 3 doses per 24 hour* What changed: medication strength   lansoprazole 15 MG capsule Commonly known as: PREVACID Take 15 mg by mouth daily at 12 noon.   levothyroxine 25 MCG tablet Commonly known as: SYNTHROID Take 25 mcg by mouth daily before breakfast.  losartan 25 MG tablet Commonly known as: COZAAR Take 1 tablet (25 mg total) by mouth daily. Start taking on: January 28, 2020   magnesium citrate Soln Take 0.5 Bottles by mouth once a week.   metFORMIN 500 MG tablet Commonly known as: GLUCOPHAGE Take 500 mg by mouth 2 (two) times daily.   metoprolol tartrate 100 MG tablet Commonly known as: LOPRESSOR Take 100 mg by mouth 2 (two) times daily.   nitroGLYCERIN 0.4 MG SL tablet Commonly known as: NITROSTAT Place 0.4 mg under the tongue every 5 (five) minutes x 3 doses as needed for chest pain.    Olopatadine HCl 0.2 % Soln Place 1 drop into both eyes daily at 6 (six) AM.   ondansetron 4 MG tablet Commonly known as: ZOFRAN Take 4 mg by mouth every 8 (eight) hours as needed for nausea or vomiting.   ticagrelor 90 MG Tabs tablet Commonly known as: BRILINTA Take 1 tablet (90 mg total) by mouth 2 (two) times daily.   tiZANidine 2 MG tablet Commonly known as: ZANAFLEX Take 2 mg by mouth 3 (three) times daily as needed for muscle spasms.   vitamin B-12 1000 MCG tablet Commonly known as: CYANOCOBALAMIN Take 1,000 mcg by mouth daily.   Vitamin D (Ergocalciferol) 1.25 MG (50000 UNIT) Caps capsule Commonly known as: DRISDOL Take 50,000 Units by mouth every 7 (seven) days.            Discharge Care Instructions  (From admission, onward)         Start     Ordered   01/27/20 0000  Discharge wound care:       Comments: Keep area clean and dry   01/27/20 1154            Time coordinating discharge: 32 minutes  Signed:  Darlin Stenseth  [facility] 01/27/2020, 12:27 PM

## 2020-01-27 NOTE — TOC Transition Note (Signed)
Transition of Care [facility]) - CM/SW Discharge Note   Patient Details  Name: Victoria Medina MRN: [id number] Date of Birth: 1961/05/02  Transition of Care [facility]) CM/SW Contact:  Maree Krabbe, LCSW Phone Number: 01/27/2020, 1:45 PM   Clinical Narrative:  Clinical Social Worker facilitated patient discharge including contacting patient family and facility to confirm patient discharge plans.  Clinical information faxed to facility and family agreeable with plan.  CSW arranged ambulance transport via [facility] to [facility] [phone]) .  RN to call [phone] for report prior to discharge.    Final next level of care: Skilled Nursing Facility Barriers to Discharge: No Barriers Identified   Patient Goals and CMS Choice Patient states their goals for this hospitalization and ongoing recovery are:: to return to snf   Choice offered to / list presented to : Patient  Discharge Placement              Patient chooses bed at: [facility] Patient to be transferred to facility by: [facility] Name of family member notified: pt aware Patient and family notified of of transfer: 01/27/20  Discharge Plan and Services In-house Referral: Clinical Social Work   Post Acute Care Choice: Skilled Nursing Facility                               Social Determinants of Health (SDOH) Interventions     Readmission Risk Interventions No flowsheet data found.

## 2020-01-27 NOTE — Telephone Encounter (Signed)
TCM....  Patient I  discharged      They are scheduled to see Dan Humphreys 7/6 at 10 am   They were seen for STEMI  They need to be seen within 7-10 days      Please call

## 2020-01-27 NOTE — Progress Notes (Signed)
Report called to shelia RN at [facility]. Discharged packet and patients status gone over with rn. Stent card and DNR form placed in packet. Patient to be discharge on Ra. Transport was called by case Research officer, political party. Will continue to monitor closely

## 2020-01-28 NOTE — Telephone Encounter (Signed)
TOC- contacted the telephone number listed in the patients chart. The person that answered the phone identified themselves as the patients sister and healthcare POA. She advised that the patient is a resident at [facility] and all f/u appt would need to be coordinated with them.  Contacted [facility] and asked to speak with the nurse taking care of the patient I was told that she was not available but that I could a message.  Left a message for the nurse. The patient has a scheduled TOC appt with [facility] on 02/04/20 @ 10am with Gillian Shields, NP. They are to contact our office if any questions or concerns.  Closing this encounter.

## 2020-02-04 ENCOUNTER — Ambulatory Visit (INDEPENDENT_AMBULATORY_CARE_PROVIDER_SITE_OTHER): Payer: Medicare Other | Admitting: Family

## 2020-02-04 ENCOUNTER — Other Ambulatory Visit: Payer: Self-pay

## 2020-02-04 ENCOUNTER — Encounter: Payer: Self-pay | Admitting: Family

## 2020-02-04 VITALS — BP 118/98 | HR 80 | Ht 61.5 in | Wt 172.5 lb

## 2020-02-04 DIAGNOSIS — I255 Ischemic cardiomyopathy: Secondary | ICD-10-CM

## 2020-02-04 DIAGNOSIS — I5020 Unspecified systolic (congestive) heart failure: Secondary | ICD-10-CM | POA: Diagnosis not present

## 2020-02-04 DIAGNOSIS — E785 Hyperlipidemia, unspecified: Secondary | ICD-10-CM

## 2020-02-04 DIAGNOSIS — I25118 Atherosclerotic heart disease of native coronary artery with other forms of angina pectoris: Secondary | ICD-10-CM

## 2020-02-04 DIAGNOSIS — I10 Essential (primary) hypertension: Secondary | ICD-10-CM | POA: Diagnosis not present

## 2020-02-04 DIAGNOSIS — I639 Cerebral infarction, unspecified: Secondary | ICD-10-CM | POA: Diagnosis not present

## 2020-02-04 NOTE — Progress Notes (Signed)
Office Visit    Patient Name: Victoria Medina Date of Encounter: 02/04/2020  Primary Care Provider:  Karie Schwalbe, MD Primary Cardiologist:  Lorine Bears, MD Electrophysiologist:  None   Chief Complaint    Victoria Medina is a 59 y.o. female with a hx of CAD with extensive stenting in 2019 and STEMI 12/2019, DM2, HTN, HLD, CVA with left-sided hemiplegia, blindness presents today for hospital follow up.    Past Medical History    Past Medical History:  Diagnosis Date  . Blind   . Diabetes mellitus without complication (HCC)   . Hyperlipemia   . Hypertension   . Stroke [facility])    Past Surgical History:  Procedure Laterality Date  . ABDOMINAL HYSTERECTOMY    . BACK SURGERY    . CORONARY/GRAFT ACUTE MI REVASCULARIZATION N/A 01/25/2020   Procedure: Coronary/Graft Acute MI Revascularization;  Surgeon: Iran Ouch, MD;  Location: [facility] INVASIVE CV LAB;  Service: Cardiovascular;  Laterality: N/A;  . FRACTURE SURGERY    . LEFT HEART CATH AND CORONARY ANGIOGRAPHY N/A 01/25/2020   Procedure: LEFT HEART CATH AND CORONARY ANGIOGRAPHY;  Surgeon: Iran Ouch, MD;  Location: [facility] INVASIVE CV LAB;  Service: Cardiovascular;  Laterality: N/A;  . SKIN GRAFT Right    Allergies  Allergies  Allergen Reactions  . Levaquin [Levofloxacin In D5w] Anaphylaxis and Swelling  . Iodinated Diagnostic Agents Itching    Severe itching  . Latex Dermatitis    History of Present Illness    Victoria Medina is a 59 y.o. female with a hx of CAD with extensive stenting in 2019 and STEMI 12/2019, DM2, HTN, HLD, CVA with left-sided hemiplegia, blindness last seen while hospitalized. She is a resident at [facility].   Seen in ED 01/22/20 for chest pain and arm pain. CXR with aortic atherosclerosis and no acute findings. Cardiac enzymes negative x2. She was discharged home and declined CT scan. Seen in ED 01/24/20 for recurrent right upper chest pain. Troponin uptrending from 244 - 436. She was started on  Heparin. However, she left the ED as she preferred treatment with palliative care approach. She re-presented to the ED 01/25/20 and was agreeable to cardiac catheterization. Her LHC showed significant underlying three vessel disease with multiple stents in LAD, LCx, RCA.  Multiple areas of significant stenosis between stented segments. Her EF was reduced with LVEF 30-35% by cath. Underwent PCi/DES to the mid LCx into the OM3. Recommended for DAPT Aspirin/Brilinta for 12 months. Her echo 01/26/20 showed LVEF 40-45%, wall motion abnormalities, mild LVH, no significant valvular abnormalities.   Present today with her mother. Shares with me that she "just wants to die peacefully" and is working with her primary care provider to see palliative care. She is not interested in additional medication interventions at this time.   Reports no recurrent chest pain, pressure, tightness nor arm pain. We reviewed the importance of Brilinta and aspirin for one year and she and her mother verbalized understanding. Her metoprolol has been discontinued since discharge for low blood pressure per her report and she is not interested in resuming.   EKGs/Labs/Other Studies Reviewed:   The following studies were reviewed today:  LHC 01/25/2020:  There is moderate to severe left ventricular systolic dysfunction.  LV end diastolic pressure is severely elevated.  Prox RCA lesion is 10% stenosed.  Mid RCA lesion is 50% stenosed.  Dist RCA lesion is 30% stenosed.  Prox LAD lesion is 20% stenosed.  Prox LAD to Mid LAD  lesion is 90% stenosed.  Mid LAD to Dist LAD lesion is 20% stenosed.  Mid LAD-1 lesion is 20% stenosed.  Mid LAD-2 lesion is 70% stenosed.  2nd Diag lesion is 80% stenosed.  Prox Cx lesion is 40% stenosed.  Mid Cx lesion is 100% stenosed.  3rd Mrg lesion is 20% stenosed.  Post intervention, there is a 0% residual stenosis.  A drug-eluting stent was successfully placed using a STENT RESOLUTE  ONYX 2.25X26.  Dist Cx lesion is 70% stenosed.   1.  Significant underlying three-vessel coronary artery disease with multiple stents in the LAD, left circumflex and right coronary artery.  The culprit for myocardial infarction is an occluded mid left circumflex at the mid to distal edge of the previously placed stent with diffuse disease extending into the stented segment of OM 3.  Patent LAD stents.  However, multiple areas of significant stenosis between the stented segments. 2.  Moderately to severely reduced LV systolic function with an EF of 30 to 35%.  Severely elevated left ventricular end-diastolic pressure at 32 mmHg. 3.  Successful angioplasty and drug-eluting stent placement to the mid left circumflex extending into OM 3 to overlap with the previously placed stent.   Recommendations: Dual antiplatelet therapy for at least 1 year. Aggressive treatment of risk factors. The patient is likely not a candidate for further revascularization given her comorbidities and desire for conservative approach.   Please note that there was delay in bringing the patient to the Cath Lab given that I had to have a discussion with her about the risks and benefits of cardiac catheterization as she refused any further evaluation the day before and she left the hospital to pursue palliative care.  The patient agreed to have catheterization done when her chest pain worsened with no relief with narcotic medications.   __________   2D echo 01/26/2020: 1. Left ventricular ejection fraction, by estimation, is 40 to 45%. The  left ventricle has mildly decreased function. The left ventricle  demonstrates regional wall motion abnormalities (see scoring  diagram/findings for description). There is mild left  ventricular hypertrophy of the basal-septal segment. Left ventricular  diastolic parameters were normal. There is moderate hypokinesis of the  left ventricular, entire inferior wall and inferolateral wall.    2. Right ventricular systolic function is normal. The right ventricular  size is normal. Tricuspid regurgitation signal is inadequate for assessing  PA pressure.   3. The mitral valve is normal in structure. Trivial mitral valve  regurgitation. No evidence of mitral stenosis.   4. The aortic valve is normal in structure. Aortic valve regurgitation is  not visualized. No aortic stenosis is present.   5. The inferior vena cava is normal in size with greater than 50%  respiratory variability, suggesting right atrial pressure of 3 mmHg.    EKG:  EKG is ordered today.  The ekg ordered today demonstrates NSR with stable posterior ST/T wave changes, no acute St/T wave changes.   Recent Labs: 01/22/2020: ALT 12; B Natriuretic Peptide 73.1 01/27/2020: BUN 21; Creatinine, Ser 0.93; Hemoglobin 11.5; Magnesium 1.8; Platelets 473; Potassium 3.3; Sodium 133  Recent Lipid Panel    Component Value Date/Time   CHOL 310 (H) 01/26/2020 0600   TRIG 269 (H) 01/26/2020 0600   HDL 59 01/26/2020 0600   CHOLHDL 5.3 01/26/2020 0600   VLDL 54 (H) 01/26/2020 0600   LDLCALC 197 (H) 01/26/2020 0600    Home Medications   Current Meds  Medication Sig  . aspirin  EC 81 MG tablet Take 81 mg by mouth daily. Swallow whole.  . Azelastine HCl 0.15 % SOLN Place 2 sprays into both nostrils 2 (two) times daily.  . bisacodyl (DULCOLAX) 10 MG suppository Place 10 mg rectally as needed for moderate constipation.  . clonazePAM (KLONOPIN) 0.5 MG tablet Take 1 tablet (0.5 mg total) by mouth every 8 (eight) hours as needed for anxiety. *limit 3 doses per 24 hour*  . lansoprazole (PREVACID) 15 MG capsule Take 15 mg by mouth daily at 12 noon.  Marland Kitchen levothyroxine (SYNTHROID) 25 MCG tablet Take 25 mcg by mouth daily before breakfast.  . losartan (COZAAR) 25 MG tablet Take 1 tablet (25 mg total) by mouth daily.  . metFORMIN (GLUCOPHAGE) 500 MG tablet Take 500 mg by mouth 2 (two) times daily.  . nitroGLYCERIN (NITROSTAT) 0.4 MG SL  tablet Place 0.4 mg under the tongue every 5 (five) minutes x 3 doses as needed for chest pain.   Marland Kitchen Olopatadine HCl 0.2 % SOLN Place 1 drop into both eyes daily at 6 (six) AM.  . ondansetron (ZOFRAN) 4 MG tablet Take 4 mg by mouth every 8 (eight) hours as needed for nausea or vomiting.  . ticagrelor (BRILINTA) 90 MG TABS tablet Take 1 tablet (90 mg total) by mouth 2 (two) times daily.  Marland Kitchen tiZANidine (ZANAFLEX) 2 MG tablet Take 2 mg by mouth 3 (three) times daily as needed for muscle spasms.  . vitamin B-12 (CYANOCOBALAMIN) 1000 MCG tablet Take 1,000 mcg by mouth daily.  . Vitamin D, Ergocalciferol, (DRISDOL) 50000 units CAPS capsule Take 50,000 Units by mouth every 7 (seven) days.  . [DISCONTINUED] magnesium citrate SOLN Take 0.5 Bottles by mouth once a week.     Review of Systems     Review of Systems  Constitutional: Positive for malaise/fatigue. Negative for chills and fever.  Cardiovascular: Negative for chest pain, dyspnea on exertion, irregular heartbeat, leg swelling, near-syncope, orthopnea, palpitations and syncope.  Respiratory: Negative for cough, shortness of breath and wheezing.   Gastrointestinal: Negative for melena, nausea and vomiting.  Genitourinary: Negative for hematuria.  Neurological: Negative for dizziness, light-headedness and weakness.  Psychiatric/Behavioral: Positive for depression.   All other systems reviewed and are otherwise negative except as noted above.  Physical Exam    VS:  BP (!) 118/98 (BP Location: Right Arm, Patient Position: Sitting, Cuff Size: Normal)   Pulse 80   Ht 5' 1.5" (1.562 m)   Wt 172 lb 8 oz (78.2 kg)   SpO2 92%   BMI 32.07 kg/m  , BMI Body mass index is 32.07 kg/m. GEN: Well nourished, overweight, well developed, in no acute distress. HEENT: normal. Neck: Supple, no JVD, carotid bruits, or masses. Cardiac: RRR, no murmurs, rubs, or gallops. No clubbing, cyanosis, edema.  Radials/DP/PT 2+ and equal bilaterally.  Respiratory:   Respirations regular and unlabored, clear to auscultation bilaterally. GI: Soft, nontender, nondistended. MS: No deformity or atrophy. Skin: Warm and dry, no rash. Scattered ecchymosis to bilateral upper extremities from recent admission and IV sticks. R radial cath site with no erythema, hematoma, nor signs of infection.  Neuro:  L sided hemiplegia.  Psych: Normal affect.  Assessment & Plan    1. CAD - Multiple previous stents with significant stenosis in-between stented segments with recent STEMI 12/2019 with DES to mid LCx into the OM3. Recommended for DAPT x12 months with Aspirin and Brilinta. Denies bleeding complications. Will defer cardiac rehab consult as she is discussing palliative consult with her primary  care provider. GDMT includes aspirin, brilinta. Metoprolol has been discontinued at her care facility and she is not interested in resuming. If she is agreeable in the future, would favor adding back low dose beta blocker due to CAD/HFrEF/ICM. No statin as she declines. If she has recurrent anginal symptoms, would favor anti-anginal therapy rather than intervention per her wishes for palliative care.   2. HFrEF secondary to ICM - EF 40-45% by echocardiogram 01/26/20. Euvolemic on exam. NYHA class difficult to ascertain due to her sedentary lifestyle and difficult history taking. GDMT includes ARB. Metoprolol discontinued at her facility for hypotension per her report and she is not interested in resuming. No indication for standing diuretic at this time. She is not interested in escalation of GDMT.   3. Hx of CVA - with subsequent L sided hemiplegia and blindness. She is interested in palliative care and her primary care provider is facilitating.  4. HTN - BP well controlled. Reports no lightheadedness, dizziness with low normal BP.   5. HLD, LDL goal <70 - She declines statin therapy despite CAD. LDL while admitted of 197.  Disposition: Follow up in 4-6 month(s) with Dr. Mardelle Matte, NP 02/04/2020, 8:56 PM

## 2020-02-04 NOTE — Patient Instructions (Addendum)
Medication Instructions:  NO medication changes today.   *If you need a refill on your cardiac medications before your next appointment, please call your pharmacy*   Lab Work: None ordered today.   Testing/Procedures: Your EKG today was stable.    Follow-Up: At [facility], you and your health needs are our priority.  As part of our continuing mission to provide you with exceptional heart care, we have created designated Provider Care Teams.  These Care Teams include your primary Cardiologist (physician) and Advanced Practice Providers (APPs -  Physician Assistants and Nurse Practitioners) who all work together to provide you with the care you need, when you need it.  We recommend signing up for the patient portal called "MyChart".  Sign up information is provided on this After Visit Summary.  MyChart is used to connect with patients for Virtual Visits (Telemedicine).  Patients are able to view lab/test results, encounter notes, upcoming appointments, etc.  Non-urgent messages can be sent to your provider as well.   To learn more about what you can do with MyChart, go to [email or web address].    Your next appointment:   4-6 month(s)  The format for your next appointment:   In Person  Provider:   You may see Lorine Bears, MD or one of the following Advanced Practice Providers on your designated Care Team:    Nicolasa Ducking, NP  Eula Listen, PA-C  Marisue Ivan, PA-C  Other Instructions  We recommend continuing your Brilinta for at least one year to protect your stent

## 2020-02-06 DIAGNOSIS — R1013 Epigastric pain: Secondary | ICD-10-CM

## 2020-02-06 DIAGNOSIS — R1011 Right upper quadrant pain: Secondary | ICD-10-CM

## 2020-02-06 DIAGNOSIS — R112 Nausea with vomiting, unspecified: Secondary | ICD-10-CM

## 2020-02-09 ENCOUNTER — Other Ambulatory Visit: Payer: Self-pay

## 2020-02-09 ENCOUNTER — Non-Acute Institutional Stay: Payer: Medicaid Other | Admitting: Adult Health Nurse Practitioner

## 2020-02-09 DIAGNOSIS — I639 Cerebral infarction, unspecified: Secondary | ICD-10-CM

## 2020-02-09 DIAGNOSIS — Z515 Encounter for palliative care: Secondary | ICD-10-CM

## 2020-02-09 NOTE — Progress Notes (Signed)
Therapist, nutritional Palliative Care Consult Note Telephone: [phone]  Fax: [phone]  PATIENT NAME: Victoria Medina DOB: 06-07-1961 MRN: [id number]  PRIMARY CARE PROVIDER:   Karie Schwalbe, MD  REFERRING PROVIDER:  Karie Schwalbe, MD [address] [place],  [place] [address]  RESPONSIBLE PARTY:   Self Victoria Medina, sister [phone] (lives in [place]) Victoria Medina, mother [phone]    RECOMMENDATIONS and PLAN:  1.  Advance care planning.  Patient is DNR.  States that she does not want aggressive interventions such as ventilation or life support.  Does state that her sister, Victoria Medina, is her POA and would also like her to be her Victoria Medina POA.  Patient is able to make her own decisions but is unable to sign for herself due to blindness.  2.  History of CVA.  Patient states that in July 2015 she had 5 MIs and then had 2 CVAs resulting in left-sided hemiparesis and blindness.  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  States that she liked to paint and to read.  Now she is unable to do the things that she used to enjoy.  States that she can walk as long as someone is assisting her.  She requires help with ADLs including feeding.  Continue supportive care at facility  3.  Anxiety.  Patient does endorse that she wished she was no longer here; referring that she would rather pass away and continue being dependent for everything and not being able to do the things she used to enjoy.  She denies depression or SI.  Does endorse having anxiety.  States that she worked with a therapist years ago and had tried many anxiety and depression medication such as BuSpar, Zoloft, Lexapro etc. states that none were effective.  States that the only thing that has helped her with her anxiety is diazepam.  States that for about 4 years she was taking diazepam 20 mg every 8 hours as needed.  States that this helped  her anxiety.  Currently she is on diazepam 10 mg every 8 hours as needed.  States that she still has anxiety.  States that her PCP will not increase her diazepam.  Discussed that 20 mg is a pretty high dose and most providers would be uncomfortable prescribing the high-dose.  Discussed providing a counselor or therapist that she can talk to about how she feels.  States that she has done this in the past and does not feel will be helpful for her right now.  Patient states that she has lost so much control and independence that controlling her medications is all that she has left.  If she does not feel something is going to be helpful then she does not want to take it.  Patient states that she is tired of people talking down to her.  States that she has degrees in biology and psychology and is able to understand what is going on with her and her medications.  As patient endorses that nothing other than the diazepam helps her anxiety and that she is unwilling to see a therapist at this time, there is really nothing that can be done until she is ready.  We will continue discussions and provide emotional support with each visit.  Continue encouragement in activities and supportive care at facility.  Denies pain, headaches, fever, shortness of breath, cough, N/V/D, constipation today.  Palliative  will continue to monitor for symptom management/decline and make recommendations as needed.  We will follow up in 2 to 4 weeks.  I spent 80 minutes providing this consultation,  from 10:30 to 11:50 including time spent with patient, chart review, provider coordination, documentation. More than 50% of the time in this consultation was spent coordinating communication.   HISTORY OF PRESENT ILLNESS:  Victoria Medina is a 59 y.o. year old female with multiple medical problems including left hemiparesis and blindness s/p CVA, DMT2, atherosclerotic Medina disease, HLD, hypothyroidism, GERD, CHF, h/o of MI. Palliative Care was asked  to help address goals of care.  Patient was recently hospitalized 6/27 through 01/27/2020 for STEMI in which she was taken to Cath Lab and drug-eluting stent placed in mid left circumflex.  She had questions about why they kept her him pain.  States that she was giving nitro as needed and was given morphine at the hospital.  States that morphine does nothing for her.  Did discuss that that is standard treatment with MI.  Denies chest pain today.   States that Thursday night she was vomiting and her abdominal muscles are still sore from this.  Has not had any nausea or vomiting since but states that she has slowly increasing what she eats.  She believes this may have been caused by something she may have eaten   CODE STATUS: DNR  PPS: 30% HOSPICE ELIGIBILITY/DIAGNOSIS: TBD  PHYSICAL EXAM:  HR 100 O2 99% on RA General: patient lying in bed in NAD Cardiovascular: regular rate and rhythm Pulmonary: Lung sounds clear; normal respiratory effort Abdomen: soft, nontender, + bowel sounds GU: no suprapubic tenderness Extremities: no edema, contractures of left hand Skin: no rashes on exposed  Neurological: Weakness but otherwise nonfocal   PAST MEDICAL HISTORY:  Past Medical History:  Diagnosis Date  . Blind   . Diabetes mellitus without complication (HCC)   . Hyperlipemia   . Hypertension   . Stroke Victoria Medina)     SOCIAL HX:  Social History   Tobacco Use  . Smoking status: Never Smoker  . Smokeless tobacco: Never Used  Substance Use Topics  . Alcohol use: No    ALLERGIES:  Allergies  Allergen Reactions  . Levaquin [Levofloxacin In D5w] Anaphylaxis and Swelling  . Iodinated Diagnostic Agents Itching    Severe itching  . Latex Dermatitis     PERTINENT MEDICATIONS:  Outpatient Encounter Medications as of 02/09/2020  Medication Sig  . aspirin EC 81 MG tablet Take 81 mg by mouth daily. Swallow whole.  . Azelastine HCl 0.15 % SOLN Place 2 sprays into both nostrils 2 (two) times daily.    . bisacodyl (DULCOLAX) 10 MG suppository Place 10 mg rectally as needed for moderate constipation.  . clonazePAM (KLONOPIN) 0.5 MG tablet Take 1 tablet (0.5 mg total) by mouth every 8 (eight) hours as needed for anxiety. *limit 3 doses per 24 hour*  . lansoprazole (PREVACID) 15 MG capsule Take 15 mg by mouth daily at 12 noon.  Marland Kitchen levothyroxine (SYNTHROID) 25 MCG tablet Take 25 mcg by mouth daily before breakfast.  . losartan (COZAAR) 25 MG tablet Take 1 tablet (25 mg total) by mouth daily.  . metFORMIN (GLUCOPHAGE) 500 MG tablet Take 500 mg by mouth 2 (two) times daily.  . nitroGLYCERIN (NITROSTAT) 0.4 MG SL tablet Place 0.4 mg under the tongue every 5 (five) minutes x 3 doses as needed for chest pain.   Marland Kitchen Olopatadine HCl 0.2 % SOLN Place 1 drop  into both eyes daily at 6 (six) AM.  . ondansetron (ZOFRAN) 4 MG tablet Take 4 mg by mouth every 8 (eight) hours as needed for nausea or vomiting.  . ticagrelor (BRILINTA) 90 MG TABS tablet Take 1 tablet (90 mg total) by mouth 2 (two) times daily.  Marland Kitchen tiZANidine (ZANAFLEX) 2 MG tablet Take 2 mg by mouth 3 (three) times daily as needed for muscle spasms.  . vitamin B-12 (CYANOCOBALAMIN) 1000 MCG tablet Take 1,000 mcg by mouth daily.  . Vitamin D, Ergocalciferol, (DRISDOL) 50000 units CAPS capsule Take 50,000 Units by mouth every 7 (seven) days.   No facility-administered encounter medications on file as of 02/09/2020.     Kambrey Hagger Marlena Clipper, NP

## 2020-02-10 ENCOUNTER — Telehealth: Payer: Self-pay | Admitting: Adult Health Nurse Practitioner

## 2020-02-10 NOTE — Telephone Encounter (Signed)
Spoke with mother to update on visit yesterday.  Mother shared some past history.  Gave her contact info and encouraged to call with any concerns.   Kacey Dysert K. Garner Nash NP

## 2020-02-26 ENCOUNTER — Telehealth: Payer: Self-pay | Admitting: Internal Medicine

## 2020-02-26 DIAGNOSIS — R339 Retention of urine, unspecified: Secondary | ICD-10-CM

## 2020-02-26 NOTE — Telephone Encounter (Signed)
Referral Request - Has patient seen PCP for this complaint? Yes.   *If NO, is insurance requiring patient see PCP for this issue before PCP can refer them? Referral for which specialty: Urologsit Preferred provider/office: [facility]  Reason for referral: urinary retention. Elnita Maxwell from [facility] stated [place] urologist called her stating referral is needing to come from PCP office and that it needs to state she is a [place] resident. Call number to inform about appointment is [phone].

## 2020-02-26 NOTE — Telephone Encounter (Signed)
Order placed

## 2020-03-05 ENCOUNTER — Other Ambulatory Visit: Payer: Self-pay

## 2020-03-05 ENCOUNTER — Ambulatory Visit (INDEPENDENT_AMBULATORY_CARE_PROVIDER_SITE_OTHER): Payer: Medicare Other | Admitting: Urology

## 2020-03-05 ENCOUNTER — Encounter: Payer: Self-pay | Admitting: Urology

## 2020-03-05 VITALS — BP 138/92 | HR 80 | Ht 66.0 in | Wt 175.0 lb

## 2020-03-05 DIAGNOSIS — N319 Neuromuscular dysfunction of bladder, unspecified: Secondary | ICD-10-CM

## 2020-03-05 DIAGNOSIS — N390 Urinary tract infection, site not specified: Secondary | ICD-10-CM | POA: Diagnosis not present

## 2020-03-05 NOTE — Progress Notes (Signed)
03/05/2020 11:06 AM   Victoria Medina Jan 20, 1961 [id number]  Referring provider: Karie Schwalbe, MD [address] [place],  [place] [address]  Chief Complaint  Patient presents with  . Urinary Retention    HPI: 59 y.o. female seen at request of Dr. Alphonsus Sias for evaluation of urinary retention.   History 2 major CVAs  Urinary retention in 2015 and had an indwelling Foley catheter for 1 year  Developed bladder spasms and leakage around the catheter which was subsequently removed  Had been doing well however over the past few months noted hesitancy and straining to urinate which is worse in the morning  Wears diaper at night and will occasionally have nocturnal enuresis however will also have sensation of full bladder and be able to void  Currently being catheterized every 12 hours  No recurrent UTI  Significant fluid intake with estimate more than a gallon of liquids per day  Recent creatinine 0.93   PMH: Past Medical History:  Diagnosis Date  . Blind   . Diabetes mellitus without complication (HCC)   . Hyperlipemia   . Hypertension   . Stroke [facility])     Surgical History: Past Surgical History:  Procedure Laterality Date  . ABDOMINAL HYSTERECTOMY    . BACK SURGERY    . CORONARY/GRAFT ACUTE MI REVASCULARIZATION N/A 01/25/2020   Procedure: Coronary/Graft Acute MI Revascularization;  Surgeon: Iran Ouch, MD;  Location: [facility] INVASIVE CV LAB;  Service: Cardiovascular;  Laterality: N/A;  . FRACTURE SURGERY    . LEFT HEART CATH AND CORONARY ANGIOGRAPHY N/A 01/25/2020   Procedure: LEFT HEART CATH AND CORONARY ANGIOGRAPHY;  Surgeon: Iran Ouch, MD;  Location: [facility] INVASIVE CV LAB;  Service: Cardiovascular;  Laterality: N/A;  . SKIN GRAFT Right     Home Medications:  Allergies as of 03/05/2020      Reactions   Levaquin [levofloxacin In D5w] Anaphylaxis, Swelling   Iodinated Diagnostic Agents Itching   Severe itching   Latex Dermatitis        Medication List       Accurate as of March 05, 2020 11:06 AM. If you have any questions, ask your nurse or doctor.        aspirin EC 81 MG tablet Take 81 mg by mouth daily. Swallow whole.   Azelastine HCl 0.15 % Soln Place 2 sprays into both nostrils 2 (two) times daily.   bisacodyl 10 MG suppository Commonly known as: DULCOLAX Place 10 mg rectally as needed for moderate constipation.   clonazePAM 0.5 MG tablet Commonly known as: KLONOPIN Take 1 tablet (0.5 mg total) by mouth every 8 (eight) hours as needed for anxiety. *limit 3 doses per 24 hour*   lansoprazole 15 MG capsule Commonly known as: PREVACID Take 15 mg by mouth daily at 12 noon.   levothyroxine 25 MCG tablet Commonly known as: SYNTHROID Take 25 mcg by mouth daily before breakfast.   losartan 25 MG tablet Commonly known as: COZAAR Take 1 tablet (25 mg total) by mouth daily.   metFORMIN 500 MG tablet Commonly known as: GLUCOPHAGE Take 500 mg by mouth 2 (two) times daily.   nitroGLYCERIN 0.4 MG SL tablet Commonly known as: NITROSTAT Place 0.4 mg under the tongue every 5 (five) minutes x 3 doses as needed for chest pain.   Olopatadine HCl 0.2 % Soln Place 1 drop into both eyes daily at 6 (six) AM.   ondansetron 4 MG tablet Commonly known as: ZOFRAN Take 4 mg by mouth  every 8 (eight) hours as needed for nausea or vomiting.   ticagrelor 90 MG Tabs tablet Commonly known as: BRILINTA Take 1 tablet (90 mg total) by mouth 2 (two) times daily.   tiZANidine 2 MG tablet Commonly known as: ZANAFLEX Take 2 mg by mouth 3 (three) times daily as needed for muscle spasms.   vitamin B-12 1000 MCG tablet Commonly known as: CYANOCOBALAMIN Take 1,000 mcg by mouth daily.   Vitamin D (Ergocalciferol) 1.25 MG (50000 UNIT) Caps capsule Commonly known as: DRISDOL Take 50,000 Units by mouth every 7 (seven) days.       Allergies:  Allergies  Allergen Reactions  . Levaquin [Levofloxacin In D5w] Anaphylaxis and  Swelling  . Iodinated Diagnostic Agents Itching    Severe itching  . Latex Dermatitis    Family History: Family History  Problem Relation Age of Onset  . Heart attack Mother   . Hypertension Mother   . Hypercholesterolemia Mother   . Diabetes Mother   . Stroke Father   . Heart attack Father   . Hypertension Father   . Hypercholesterolemia Father   . Diabetes Father   . Diabetes Maternal Grandmother   . Cancer - Other Maternal Grandmother     Social History:  reports that she has never smoked. She has never used smokeless tobacco. She reports that she does not drink alcohol and does not use drugs.   Physical Exam: BP (!) 138/92 (BP Location: Right Arm, Patient Position: Sitting, Cuff Size: Normal)   Pulse 80   Ht 5\' 6"  (1.676 m)   Wt 175 lb (79.4 kg)   BMI 28.25 kg/m   Constitutional:  Alert, No acute distress. HEENT: [place] AT, moist mucus membranes.  Trachea midline, no masses. Cardiovascular: No clubbing, cyanosis, or edema. Respiratory: Normal respiratory effort, no increased work of breathing. Skin: No rashes, bruises or suspicious lesions. Psychiatric: Normal mood and affect.   Assessment & Plan:    1.  Neurogenic bladder  Secondary to CVA  Doing well on intermittent catheterization and would increase interval to every 8 hours  Follow-up 1 year with renal ultrasound   , MD  [facility] [address] [place], [place] [id number]

## 2020-03-07 ENCOUNTER — Encounter: Payer: Self-pay | Admitting: Urology

## 2020-03-12 ENCOUNTER — Other Ambulatory Visit: Payer: Self-pay

## 2020-03-12 ENCOUNTER — Non-Acute Institutional Stay: Payer: Medicaid Other | Admitting: Adult Health Nurse Practitioner

## 2020-03-12 DIAGNOSIS — I639 Cerebral infarction, unspecified: Secondary | ICD-10-CM

## 2020-03-12 DIAGNOSIS — Z515 Encounter for palliative care: Secondary | ICD-10-CM

## 2020-03-13 NOTE — Progress Notes (Signed)
Therapist, nutritional Palliative Care Consult Note Telephone: [phone]  Fax: [phone]  PATIENT NAME: Victoria Medina DOB: 10/28/1960 MRN: [id number]  PRIMARY CARE PROVIDER:   Karie Schwalbe, MD  REFERRING PROVIDER:  Karie Schwalbe, MD [address] [place],  [place] [address]  RESPONSIBLE PARTY:   Self Xylina Rhoads, sister [phone] (lives in [place]) Nehemie Casserly, mother [phone]    RECOMMENDATIONS and PLAN:  1.  Advance care planning.  Patient is DNR.  States that she does not want aggressive interventions such as ventilation or life support. Patient is able to make her own decisions but is unable to sign for herself due to blindness.  2.  Functional status.  States that she can walk as long as someone is assisting her.  She requires help with ADLs including feeding. No reported falls.  Continue supportive care at facility  3.  Nutritional status.  Patient states that she does not like the food at the facility and her mother brings her a lot of meals.  States that she is a vegetarian.  States that she has spoken with RD at facility. Current weight is 175 pounds with BMI of 28.25.  Continue offering meals per patient preference.  4. Support.  Patient talked today about past experiences that she enjoyed and about books she liked to read.  Talked about books that she looks forward to coming out as audio books so she can listen to them.  Offered therapeutic listening as patient seemed to be enjoying the discussion    I spent 50 minutes providing this consultation,  from 11:25 to 12:15 including time spent with patient, chart review, provider coordination, documentation. More than 50% of the time in this consultation was spent coordinating communication.   HISTORY OF PRESENT ILLNESS:  Victoria Medina is a 59 y.o. year old female with multiple medical problems including left hemiparesis and blindness s/p CVA, DMT2, atherosclerotic heart  disease, HLD, hypothyroidism, GERD, CHF, h/o of MI. Palliative Care was asked to help address goals of care. in July 2015 she had 5 MIs and then had 2 CVAs resulting in left-sided hemiparesis and blindness.  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  States that she liked to paint and to read.  Now she is unable to do the things that she used to enjoy.  States that she has had stomach issues all her life and it is not unusual for her to have episodes of vomiting.  Does state that she vomited this morning.  Also states having an episode of chest pain this morning that was relieved with PRN nitro.  Denies fever, any other pain, headaches, diarrhea, constipation, increased SOB or cough, dysuria, hematuria.    CODE STATUS: DNR  PPS: 30% HOSPICE ELIGIBILITY/DIAGNOSIS: TBD  PHYSICAL EXAM:   General: patient lying in bed in NAD Cardiovascular: regular rate and rhythm Pulmonary: Lung sounds clear; normal respiratory effort Abdomen: soft, nontender, + bowel sounds GU: no suprapubic tenderness Extremities: no edema, contractures of left hand Skin: no rashes on exposed  Neurological: Weakness but otherwise nonfocal  PAST MEDICAL HISTORY:  Past Medical History:  Diagnosis Date  . Blind   . Diabetes mellitus without complication (HCC)   . Hyperlipemia   . Hypertension   . Stroke [facility])     SOCIAL HX:  Social History   Tobacco Use  . Smoking status: Never Smoker  . Smokeless tobacco:  Never Used  Substance Use Topics  . Alcohol use: No    ALLERGIES:  Allergies  Allergen Reactions  . Levaquin [Levofloxacin In D5w] Anaphylaxis and Swelling  . Iodinated Diagnostic Agents Itching    Severe itching  . Latex Dermatitis     PERTINENT MEDICATIONS:  Outpatient Encounter Medications as of 03/12/2020  Medication Sig  . aspirin EC 81 MG tablet Take 81 mg by mouth daily. Swallow whole.  . Azelastine HCl 0.15 % SOLN Place 2 sprays into both  nostrils 2 (two) times daily.  . bisacodyl (DULCOLAX) 10 MG suppository Place 10 mg rectally as needed for moderate constipation.  . clonazePAM (KLONOPIN) 0.5 MG tablet Take 1 tablet (0.5 mg total) by mouth every 8 (eight) hours as needed for anxiety. *limit 3 doses per 24 hour*  . lansoprazole (PREVACID) 15 MG capsule Take 15 mg by mouth daily at 12 noon. (Patient not taking: Reported on 03/05/2020)  . levothyroxine (SYNTHROID) 25 MCG tablet Take 25 mcg by mouth daily before breakfast.  . losartan (COZAAR) 25 MG tablet Take 1 tablet (25 mg total) by mouth daily.  . metFORMIN (GLUCOPHAGE) 500 MG tablet Take 500 mg by mouth 2 (two) times daily.  . nitroGLYCERIN (NITROSTAT) 0.4 MG SL tablet Place 0.4 mg under the tongue every 5 (five) minutes x 3 doses as needed for chest pain.   Marland Kitchen Olopatadine HCl 0.2 % SOLN Place 1 drop into both eyes daily at 6 (six) AM.  . ondansetron (ZOFRAN) 4 MG tablet Take 4 mg by mouth every 8 (eight) hours as needed for nausea or vomiting.  . ticagrelor (BRILINTA) 90 MG TABS tablet Take 1 tablet (90 mg total) by mouth 2 (two) times daily.  Marland Kitchen tiZANidine (ZANAFLEX) 2 MG tablet Take 2 mg by mouth 3 (three) times daily as needed for muscle spasms.  . vitamin B-12 (CYANOCOBALAMIN) 1000 MCG tablet Take 1,000 mcg by mouth daily.  . Vitamin D, Ergocalciferol, (DRISDOL) 50000 units CAPS capsule Take 50,000 Units by mouth every 7 (seven) days.   No facility-administered encounter medications on file as of 03/12/2020.     Corney Knighton Marlena Clipper, NP

## 2020-04-20 DIAGNOSIS — N319 Neuromuscular dysfunction of bladder, unspecified: Secondary | ICD-10-CM

## 2020-05-09 ENCOUNTER — Emergency Department: Payer: Medicare Other

## 2020-05-09 ENCOUNTER — Other Ambulatory Visit: Payer: Self-pay

## 2020-05-09 ENCOUNTER — Emergency Department
Admission: EM | Admit: 2020-05-09 | Discharge: 2020-05-09 | Discharge status: Against Medical Advice | Payer: Medicare Other | Attending: Emergency Medicine | Admitting: Emergency Medicine

## 2020-05-09 DIAGNOSIS — Z7982 Long term (current) use of aspirin: Secondary | ICD-10-CM | POA: Diagnosis not present

## 2020-05-09 DIAGNOSIS — Z7984 Long term (current) use of oral hypoglycemic drugs: Secondary | ICD-10-CM | POA: Diagnosis not present

## 2020-05-09 DIAGNOSIS — Z5329 Procedure and treatment not carried out because of patient's decision for other reasons: Secondary | ICD-10-CM | POA: Diagnosis not present

## 2020-05-09 DIAGNOSIS — I10 Essential (primary) hypertension: Secondary | ICD-10-CM | POA: Diagnosis not present

## 2020-05-09 DIAGNOSIS — Z79899 Other long term (current) drug therapy: Secondary | ICD-10-CM | POA: Insufficient documentation

## 2020-05-09 DIAGNOSIS — E119 Type 2 diabetes mellitus without complications: Secondary | ICD-10-CM | POA: Diagnosis not present

## 2020-05-09 DIAGNOSIS — Z91040 Latex allergy status: Secondary | ICD-10-CM | POA: Diagnosis not present

## 2020-05-09 DIAGNOSIS — E039 Hypothyroidism, unspecified: Secondary | ICD-10-CM | POA: Insufficient documentation

## 2020-05-09 DIAGNOSIS — R0789 Other chest pain: Secondary | ICD-10-CM | POA: Diagnosis not present

## 2020-05-09 LAB — BASIC METABOLIC PANEL
Anion gap: 12 (ref 5–15)
BUN: 13 mg/dL (ref 6–20)
CO2: 23 mmol/L (ref 22–32)
Calcium: 9 mg/dL (ref 8.9–10.3)
Chloride: 103 mmol/L (ref 98–111)
Creatinine, Ser: 0.84 mg/dL (ref 0.44–1.00)
GFR, Estimated: >60 mL/min (ref >60)
Glucose, Bld: 105 mg/dL — ABNORMAL HIGH (ref 70–99)
Potassium: 4 mmol/L (ref 3.5–5.1)
Sodium: 138 mmol/L (ref 135–145)

## 2020-05-09 LAB — TROPONIN I (HIGH SENSITIVITY): Troponin I (High Sensitivity): 8 ng/L (ref <18)

## 2020-05-09 LAB — CBC
HCT: 31.9 % — ABNORMAL LOW (ref 36.0–46.0)
Hemoglobin: 9.7 g/dL — ABNORMAL LOW (ref 12.0–15.0)
MCH: 22.3 pg — ABNORMAL LOW (ref 26.0–34.0)
MCHC: 30.4 g/dL (ref 30.0–36.0)
MCV: 73.3 fL — ABNORMAL LOW (ref 80.0–100.0)
Platelets: 501 10*3/uL — ABNORMAL HIGH (ref 150–400)
RBC: 4.35 MIL/uL (ref 3.87–5.11)
RDW: 17.2 % — ABNORMAL HIGH (ref 11.5–15.5)
WBC: 16.5 10*3/uL — ABNORMAL HIGH (ref 4.0–10.5)
nRBC: 0 % (ref 0.0–0.2)

## 2020-05-09 NOTE — ED Notes (Signed)
Pt assisted to the bathroom at this time, pt was 2 assist. Pt asking to leaved advised her again that [facility] would not allow her to return without being seen by provider.

## 2020-05-09 NOTE — ED Notes (Signed)
FIRST NURSE NOTE: Pt making comments about wanting to leave, per papework from [facility] patient will be accepted back to their facility once she is seen and evaluated and has had proper treatment and diagnosis.  Informed patient of this, pt states all she wanted was an EKG. Advised patient that EKG is not entirely diagnostic and does not explain her sxs and that lab work is needed to rule anything else out especially a heart attack.

## 2020-05-09 NOTE — ED Triage Notes (Signed)
Pt arrives via ems from [facility] with c/o CP, pain increases with deep breath. Pt denies any radiation of pain, stays in center of chest. Hx of MI, CVA, blindness that resulted from previous stroke. EMS administered 324 Asprin. Pt refused IV with ems. Ems unable to admin NTG prior to arrival. NAD noted at this time.

## 2020-05-09 NOTE — ED Provider Notes (Signed)
[facility] Emergency Department Provider Note  ____________________________________________   First MD Initiated Contact with Patient 05/09/20 1357     (approximate)  I have reviewed the triage vital signs and the nursing notes.   HISTORY  Chief Complaint Chest Pain    HPI Victoria Medina is a 59 y.o. female with history of hypertension, hyperlipidemia, diabetes, coronary disease, here with atypical chest pain.  The patient is somewhat agitated on my interview, stating that she wants to go home.  She states that she was sent here by her facility because she used nitroglycerin twice.  She states that she woke up this morning with a sharp, somewhat positional, but also pleuritic left-sided chest pain.  This pain resolved after nitroglycerin.  However, the facility was concerned given her history so sent her here.  She would like to defer any further work-up.  She has had no chest pain in the last several hours in the waiting room.  Denies any cough.  Denies any mopped assist.  Denies any lower extremity swelling.  No recent immobilization or trauma.  No other complaints.        Past Medical History:  Diagnosis Date  . Blind   . Diabetes mellitus without complication (HCC)   . Hyperlipemia   . Hypertension   . Stroke [facility])     Patient Active Problem List   Diagnosis Date Noted  . Acute ST elevation myocardial infarction (STEMI) of inferolateral wall (HCC) 01/25/2020  . NSTEMI (non-ST elevated myocardial infarction) (HCC) 01/24/2020  . Stroke (HCC)   . Diabetes mellitus without complication (HCC)   . Hypertension   . Blind   . Hypothyroidism (acquired)   . Anxiety     Past Surgical History:  Procedure Laterality Date  . ABDOMINAL HYSTERECTOMY    . BACK SURGERY    . CORONARY/GRAFT ACUTE MI REVASCULARIZATION N/A 01/25/2020   Procedure: Coronary/Graft Acute MI Revascularization;  Surgeon: Iran Ouch, MD;  Location: [facility] INVASIVE CV LAB;   Service: Cardiovascular;  Laterality: N/A;  . FRACTURE SURGERY    . LEFT HEART CATH AND CORONARY ANGIOGRAPHY N/A 01/25/2020   Procedure: LEFT HEART CATH AND CORONARY ANGIOGRAPHY;  Surgeon: Iran Ouch, MD;  Location: [facility] INVASIVE CV LAB;  Service: Cardiovascular;  Laterality: N/A;  . SKIN GRAFT Right     Prior to Admission medications   Medication Sig Start Date End Date Taking? Authorizing Provider  aspirin EC 81 MG tablet Take 81 mg by mouth daily. Swallow whole.    [provider]  Azelastine HCl 0.15 % SOLN Place 2 sprays into both nostrils 2 (two) times daily. 01/14/20   [provider]  bisacodyl (DULCOLAX) 10 MG suppository Place 10 mg rectally as needed for moderate constipation.    [provider]  clonazePAM (KLONOPIN) 0.5 MG tablet Take 1 tablet (0.5 mg total) by mouth every 8 (eight) hours as needed for anxiety. *limit 3 doses per 24 hour* 01/27/20   Lurene Shadow, MD  lansoprazole (PREVACID) 15 MG capsule Take 15 mg by mouth daily at 12 noon. Patient not taking: Reported on 03/05/2020    [provider]  levothyroxine (SYNTHROID) 25 MCG tablet Take 25 mcg by mouth daily before breakfast.    [provider]  losartan (COZAAR) 25 MG tablet Take 1 tablet (25 mg total) by mouth daily. 01/28/20   Lurene Shadow, MD  metFORMIN (GLUCOPHAGE) 500 MG tablet Take 500 mg by mouth 2 (two) times daily.    [provider]  nitroGLYCERIN (NITROSTAT) 0.4 MG SL tablet Place 0.4 mg under the tongue every 5 (five) minutes x 3 doses as needed for chest pain.     [provider]  Olopatadine HCl 0.2 % SOLN Place 1 drop into both eyes daily at 6 (six) AM.    [provider]  ondansetron (ZOFRAN) 4 MG tablet Take 4 mg by mouth every 8 (eight) hours as needed for nausea or vomiting.    [provider]  ticagrelor (BRILINTA) 90 MG TABS tablet Take 1 tablet (90 mg total) by mouth 2 (two) times daily. 01/27/20 01/26/21  Lurene Shadow, MD  tiZANidine (ZANAFLEX) 2 MG tablet Take 2 mg by mouth 3 (three) times daily as needed for muscle spasms.    [provider]  vitamin B-12 (CYANOCOBALAMIN) 1000 MCG tablet Take 1,000 mcg by mouth daily.    [provider]  Vitamin D, Ergocalciferol, (DRISDOL) 50000 units CAPS capsule Take 50,000 Units by mouth every 7 (seven) days.    [provider]    Allergies Levaquin [levofloxacin in d5w], Iodinated diagnostic agents, and Latex  Family History  Problem Relation Age of Onset  . Heart attack Mother   . Hypertension Mother   . Hypercholesterolemia Mother   . Diabetes Mother   . Stroke Father   . Heart attack Father   . Hypertension Father   . Hypercholesterolemia Father   . Diabetes Father   . Diabetes Maternal Grandmother   . Cancer - Other Maternal Grandmother     Social History Social History   Tobacco Use  . Smoking status: Never Smoker  . Smokeless tobacco: Never Used  Substance Use Topics  . Alcohol use: No  . Drug use: No    Review of Systems  Review of Systems  Constitutional: Negative for fatigue and fever.  HENT: Negative for congestion and sore throat.   Eyes: Negative for visual disturbance.  Respiratory: Negative for cough and shortness of breath.   Cardiovascular: Positive for chest pain.  Gastrointestinal: Negative for abdominal pain, diarrhea, nausea and vomiting.  Genitourinary: Negative for flank pain.  Musculoskeletal: Negative for back pain and neck pain.  Skin: Negative for rash and wound.  Neurological: Negative for weakness.  All other systems reviewed and are negative.    ____________________________________________  PHYSICAL EXAM:      VITAL SIGNS: ED Triage Vitals  Enc Vitals Group     BP 05/09/20 0900 136/84     Pulse Rate 05/09/20 0900 96     Resp 05/09/20 0900 16     Temp 05/09/20 0900 98.1 F (36.7 C)     Temp Source 05/09/20 0900 Oral     SpO2 05/09/20 0900 98 %     Weight --       Height --      Head Circumference --      Peak Flow --      Pain Score 05/09/20 0907 3     Pain Loc --      Pain Edu? --      Excl. in GC? --      Physical Exam Vitals and nursing note reviewed.  Constitutional:      General: She is not in acute distress.    Appearance: She is well-developed.  HENT:     Head: Normocephalic and atraumatic.  Eyes:     Conjunctiva/sclera: Conjunctivae normal.  Cardiovascular:     Rate and Rhythm: Normal rate and regular rhythm.  Heart sounds: Normal heart sounds. No murmur heard.  No friction rub.  Pulmonary:     Effort: Pulmonary effort is normal. No respiratory distress.     Breath sounds: Normal breath sounds. No decreased breath sounds, wheezing or rales.  Abdominal:     General: There is no distension.     Palpations: Abdomen is soft.     Tenderness: There is no abdominal tenderness.  Musculoskeletal:     Cervical back: Neck supple.  Skin:    General: Skin is warm.     Capillary Refill: Capillary refill takes less than 2 seconds.  Neurological:     Mental Status: She is alert and oriented to person, place, and time.     Motor: No abnormal muscle tone.       ____________________________________________   LABS (all labs ordered are listed, but only abnormal results are displayed)  Labs Reviewed  BASIC METABOLIC PANEL - Abnormal; Notable for the following components:      Result Value   Glucose, Bld 105 (*)    All other components within normal limits  CBC - Abnormal; Notable for the following components:   WBC 16.5 (*)    Hemoglobin 9.7 (*)    HCT 31.9 (*)    MCV 73.3 (*)    MCH 22.3 (*)    RDW 17.2 (*)    Platelets 501 (*)    All other components within normal limits  TROPONIN I (HIGH SENSITIVITY)  TROPONIN I (HIGH SENSITIVITY)    ____________________________________________  EKG: Normal sinus rhythm, ventricular rate 98.  QRS 76, QTc 469.  No acute ST elevations or depressions.  No evidence of acute ischemia or  infarction compared to prior. ________________________________________  RADIOLOGY All imaging, including plain films, CT scans, and ultrasounds, independently reviewed by me, and interpretations confirmed via formal radiology reads.  ED MD interpretation:     Official radiology report(s): No results found.  ____________________________________________  PROCEDURES   Procedure(s) performed (including Critical Care):  Procedures  ____________________________________________  INITIAL IMPRESSION / MDM / ASSESSMENT AND PLAN / ED COURSE  As part of my medical decision making, I reviewed the following data within the electronic MEDICAL RECORD NUMBER Nursing notes reviewed and incorporated, Old chart reviewed, Notes from prior ED visits, and [place] Controlled Substance Database       *Jaynell Castagnola was evaluated in Emergency Department on 05/09/2020 for the symptoms described in the history of present illness. She was evaluated in the context of the global COVID-19 pandemic, which necessitated consideration that the patient might be at risk for infection with the SARS-CoV-2 virus that causes COVID-19. Institutional protocols and algorithms that pertain to the evaluation of patients at risk for COVID-19 are in a state of rapid change based on information released by regulatory bodies including the CDC and federal and state organizations. These policies and algorithms were followed during the patient's care in the ED.  Some ED evaluations and interventions may be delayed as a result of limited staffing during the pandemic.*     Medical Decision Making: 59 year old female here with transient, now resolved chest pain.  Troponin is negative and EKG is nonischemic.  On arrival to the room, patient is adamant that she refuses further work-up and states that her symptoms have been resolved since receiving nitroglycerin.  I reviewed her prior admissions, including recent cath which showed diffuse disease which  is currently being treated medically.  Do not suspect she has any recurrent high-grade stenosis, but based on her history,  I had a very long discussion with her recommending at least second troponin, as well as possible CT angio for PE work-up.  I discussed that without doing so, I would be unable to rule out blood clot, heart attack, or other potentially life-threatening illness.  Patient is calm, able to voice the risks of refusing further treatment, and is adamant about returning to her facility.  Given resolution of her pain with normal EKG and negative troponin, I do not suspect she is having active ischemia.  She does not appear intoxicated or otherwise lacks capacity.  Will have her sign out AMA, but encouraged to return with any worsening pain, shortness of breath, or other concerning symptoms.  ____________________________________________  FINAL CLINICAL IMPRESSION(S) / ED DIAGNOSES  Final diagnoses:  Atypical chest pain  Left against medical advice     MEDICATIONS GIVEN DURING THIS VISIT:  Medications - No data to display   ED Discharge Orders    None       Note:  This document was prepared using Dragon voice recognition software and may include unintentional dictation errors.   Shaune Pollack, MD 05/09/20 [phone]

## 2020-05-09 NOTE — ED Notes (Signed)
Pt refuses further testing and feels confident and ready to leave. Will call [facility] to give AMA report.

## 2020-05-09 NOTE — ED Notes (Signed)
ED Provider at bedside. 

## 2020-05-09 NOTE — ED Notes (Signed)
Spoke with [facility] regarding pt AMA and discharge status, per MD. Was advised that per [facility] policy, if pt comes EMS they have to be d/c ems and [facility] doesn't have transport. Will work on transport.

## 2020-05-09 NOTE — Discharge Instructions (Addendum)
As we discussed, I would highly recommend staying for further work-up.  At this point, we cannot rule out heart attack, blood clot, or other possibly life-threatening illnesses.  That being said, your initial labs were reassuring.  I would recommend calling your doctor tomorrow to discuss your ER visit and set up a urgent outpatient follow-up.  If you decide that you would like further care, return to the ER.  For now, you can continue your nitroglycerin every 5 to 10 minutes as needed for chest pain.  Do not take this more than 3 times over the course of an hour.

## 2020-05-09 NOTE — ED Notes (Signed)
Refused CXR

## 2020-05-12 DIAGNOSIS — I2510 Atherosclerotic heart disease of native coronary artery without angina pectoris: Secondary | ICD-10-CM

## 2020-05-12 DIAGNOSIS — R079 Chest pain, unspecified: Secondary | ICD-10-CM

## 2020-05-17 ENCOUNTER — Encounter: Payer: Self-pay | Admitting: Physician Assistant

## 2020-05-17 ENCOUNTER — Other Ambulatory Visit: Payer: Self-pay

## 2020-05-17 ENCOUNTER — Ambulatory Visit (INDEPENDENT_AMBULATORY_CARE_PROVIDER_SITE_OTHER): Payer: Medicare Other | Admitting: Physician Assistant

## 2020-05-17 VITALS — BP 120/85 | HR 101 | Ht 66.0 in | Wt 175.0 lb

## 2020-05-17 DIAGNOSIS — N319 Neuromuscular dysfunction of bladder, unspecified: Secondary | ICD-10-CM | POA: Diagnosis not present

## 2020-05-17 DIAGNOSIS — R301 Vesical tenesmus: Secondary | ICD-10-CM

## 2020-05-17 LAB — BLADDER SCAN AMB NON-IMAGING: Scan Result: 12

## 2020-05-17 MED ORDER — SULFAMETHOXAZOLE-TRIMETHOPRIM 800-160 MG PO TABS: 1.0000 | ORAL_TABLET | Freq: Two times a day (BID) | ORAL | 0 refills | Status: AC

## 2020-05-17 MED ORDER — MIRABEGRON ER 25 MG PO TB24: 25.0000 mg | ORAL_TABLET | Freq: Every day | ORAL | 2 refills | Status: DC

## 2020-05-17 NOTE — Progress Notes (Signed)
05/17/2020 10:42 AM   [name] 12-25-1960 [id number]  CC: Chief Complaint  Patient presents with  . Urinary Retention    HPI: Victoria Medina is a 59 y.o. female with a history of neurogenic bladder managed by CIC who presents today with reports of increased difficulty urinating.  She was seen in clinic most recently by Dr. Lonna Cobb on 03/05/2020.  She was counseled to increase CIC to every 8 hours at that time.  Today she reports that the staff at her facility have been catheterizing her less than once daily.  She reports ongoing difficulty urinating and abdominal discomfort.  She states she does not have pain with urination and is not concerned for urinary tract infections, but feels that she is in urinary retention "all the time."  On further discussion, she reports sensations of urgency and lower abdominal pain associated with larger volumes of urinary drainage, 400+ mL.  However, she also reports some painful bladder sensations when she knows her bladder to be empty.  She states that the nurse at [facility] told her that she should be on medication for bladder spasms.  She also reports a history of developing rare side effects from medications and is concerned about starting a medication for bladder spasms due to this.  In-office catheterized UA today positive for trace ketones, trace intact blood, trace protein, and 1+ leukocyte esterase; urine microscopy with >30 WBCs/HPF, granular and hyaline casts, and many bacteria. PVR 56mL.  PMH: Past Medical History:  Diagnosis Date  . Blind   . Diabetes mellitus without complication (HCC)   . Hyperlipemia   . Hypertension   . Stroke [facility])     Surgical History: Past Surgical History:  Procedure Laterality Date  . ABDOMINAL HYSTERECTOMY    . BACK SURGERY    . CORONARY/GRAFT ACUTE MI REVASCULARIZATION N/A 01/25/2020   Procedure: Coronary/Graft Acute MI Revascularization;  Surgeon: Iran Ouch, MD;  Location: [facility] INVASIVE CV  LAB;  Service: Cardiovascular;  Laterality: N/A;  . FRACTURE SURGERY    . LEFT HEART CATH AND CORONARY ANGIOGRAPHY N/A 01/25/2020   Procedure: LEFT HEART CATH AND CORONARY ANGIOGRAPHY;  Surgeon: Iran Ouch, MD;  Location: [facility] INVASIVE CV LAB;  Service: Cardiovascular;  Laterality: N/A;  . SKIN GRAFT Right     Home Medications:  Allergies as of 05/17/2020      Reactions   Levaquin [levofloxacin In D5w] Anaphylaxis, Swelling   Iodinated Diagnostic Agents Itching   Severe itching   Latex Dermatitis      Medication List       Accurate as of May 17, 2020 10:42 AM. If you have any questions, ask your nurse or doctor.        aspirin EC 81 MG tablet Take 81 mg by mouth daily. Swallow whole.   Azelastine HCl 0.15 % Soln Place 2 sprays into both nostrils 2 (two) times daily.   bisacodyl 10 MG suppository Commonly known as: DULCOLAX Place 10 mg rectally as needed for moderate constipation.   clonazePAM 0.5 MG tablet Commonly known as: KLONOPIN Take 1 tablet (0.5 mg total) by mouth every 8 (eight) hours as needed for anxiety. *limit 3 doses per 24 hour*   lansoprazole 15 MG capsule Commonly known as: PREVACID Take 15 mg by mouth daily at 12 noon.   levothyroxine 25 MCG tablet Commonly known as: SYNTHROID Take 25 mcg by mouth daily before breakfast.   losartan 25 MG tablet Commonly known as: COZAAR Take 1 tablet (25 mg  total) by mouth daily.   metFORMIN 500 MG tablet Commonly known as: GLUCOPHAGE Take 500 mg by mouth 2 (two) times daily.   nitroGLYCERIN 0.4 MG SL tablet Commonly known as: NITROSTAT Place 0.4 mg under the tongue every 5 (five) minutes x 3 doses as needed for chest pain.   Olopatadine HCl 0.2 % Soln Place 1 drop into both eyes daily at 6 (six) AM.   ondansetron 4 MG tablet Commonly known as: ZOFRAN Take 4 mg by mouth every 8 (eight) hours as needed for nausea or vomiting.   ticagrelor 90 MG Tabs tablet Commonly known as: BRILINTA Take 1  tablet (90 mg total) by mouth 2 (two) times daily.   tiZANidine 2 MG tablet Commonly known as: ZANAFLEX Take 2 mg by mouth 3 (three) times daily as needed for muscle spasms.   vitamin B-12 1000 MCG tablet Commonly known as: CYANOCOBALAMIN Take 1,000 mcg by mouth daily.   Vitamin D (Ergocalciferol) 1.25 MG (50000 UNIT) Caps capsule Commonly known as: DRISDOL Take 50,000 Units by mouth every 7 (seven) days.       Allergies:  Allergies  Allergen Reactions  . Levaquin [Levofloxacin In D5w] Anaphylaxis and Swelling  . Iodinated Diagnostic Agents Itching    Severe itching  . Latex Dermatitis    Family History: Family History  Problem Relation Age of Onset  . Heart attack Mother   . Hypertension Mother   . Hypercholesterolemia Mother   . Diabetes Mother   . Stroke Father   . Heart attack Father   . Hypertension Father   . Hypercholesterolemia Father   . Diabetes Father   . Diabetes Maternal Grandmother   . Cancer - Other Maternal Grandmother     Social History:   reports that she has never smoked. She has never used smokeless tobacco. She reports that she does not drink alcohol and does not use drugs.  Physical Exam: BP 120/85   Pulse (!) 101   Ht 5\' 6"  (1.676 m)   Wt 175 lb (79.4 kg)   BMI 28.25 kg/m   Constitutional:  Alert and oriented, no acute distress, nontoxic appearing HEENT: [place], AT, blind Cardiovascular: No clubbing, cyanosis, or edema Respiratory: Normal respiratory effort, no increased work of breathing Skin: No rashes, bruises or suspicious lesions Neurologic: Grossly intact, no focal deficits, moving all 4 extremities Psychiatric: Normal mood and affect  Laboratory Data: Results for orders placed or performed in visit on 05/17/20  Microscopic Examination   Urine  Result Value Ref Range   WBC, UA >30 (A) 0 - 5 /hpf   RBC 0-2 0 - 2 /hpf   Epithelial Cells (non renal) 0-10 0 - 10 /hpf   Casts Present (A) None seen /lpf   Cast Type Granular casts  (A) N/A   Bacteria, UA Many (A) None seen/Few  Urinalysis, Complete  Result Value Ref Range   Specific Gravity, UA >1.030 (H) 1.005 - 1.030   pH, UA 5.5 5.0 - 7.5   Color, UA Orange Yellow   Appearance Ur Cloudy (A) Clear   Leukocytes,UA 1+ (A) Negative   Protein,UA Trace (A) Negative/Trace   Glucose, UA Negative Negative   Ketones, UA Trace (A) Negative   RBC, UA Trace (A) Negative   Bilirubin, UA Negative Negative   Urobilinogen, Ur 0.2 0.2 - 1.0 mg/dL   Nitrite, UA Negative Negative   Microscopic Examination See below:   BLADDER SCAN AMB NON-IMAGING  Result Value Ref Range   Scan Result 12  Assessment & Plan:   1. Neurogenic bladder PVR WNL today.  I counseled the patient that I recommend continued CIC for management of her neurogenic bladder.  Note written for [facility] staff to start CIC on a scheduled basis every 8 hours and as needed. - BLADDER SCAN AMB NON-IMAGING  2. Painful bladder spasm UA suspicious for possible infection today, will start empiric Bactrim and send for culture for further evaluation.  Additionally, starting Myrbetriq 25 mg daily for management of painful bladder spasms.  We will plan for symptom recheck in 1 month. - Urinalysis, Complete - mirabegron ER (MYRBETRIQ) 25 MG TB24 tablet; Take 1 tablet (25 mg total) by mouth daily.  Dispense: 30 tablet; Refill: 2 - CULTURE, URINE COMPREHENSIVE - sulfamethoxazole-trimethoprim (BACTRIM DS) 800-160 MG tablet; Take 1 tablet by mouth 2 (two) times daily for 5 days.  Dispense: 10 tablet; Refill: 0   Return in about 4 weeks (around 06/14/2020) for Symptom recheck with PVR.  Carman Ching, PA-C  [facility] [address] [place], [place] [address] [phone]

## 2020-05-18 LAB — MICROSCOPIC EXAMINATION: WBC, UA: >30 /hpf — AB (ref 0–5)

## 2020-05-18 LAB — URINALYSIS, COMPLETE
Bilirubin, UA: NEGATIVE
Glucose, UA: NEGATIVE
Nitrite, UA: NEGATIVE
Specific Gravity, UA: >1.03 — ABNORMAL HIGH (ref 1.005–1.030)
Urobilinogen, Ur: 0.2 mg/dL (ref 0.2–1.0)
pH, UA: 5.5 (ref 5.0–7.5)

## 2020-05-23 LAB — CULTURE, URINE COMPREHENSIVE

## 2020-05-25 ENCOUNTER — Telehealth: Payer: Self-pay | Admitting: Physician Assistant

## 2020-05-25 MED ORDER — FOSFOMYCIN TROMETHAMINE 3 G PO PACK
3.0000 g | PACK | Freq: Once | ORAL | 0 refills | Status: AC
Start: 2020-05-25 — End: 2020-05-25

## 2020-05-25 NOTE — Telephone Encounter (Signed)
Called [employer] [phone] to attempt PA, spoke with shasha and was notified PA is not done over the phone and must be completed on covermymeds. Key# [id number] given and PA was started with expedited review on covermymeds PA was approved [id number] Spoke with Larita Fife at pharmacy and prescription was filled Notified patient's POA [place]

## 2020-05-25 NOTE — Telephone Encounter (Signed)
Spoke with patient's nurse at [name] [phone]) she was notified of results. She states that the medication is not covered under her insurance and pharmacy is not able to fill. It was expressed that this is the only oral medication to treat the second bacteria and patient needs the medication. [facility] coupon [facility] was discussed. Unfortunately patient does not have a local POA. Called and spoke with patient's mother Victoria Medina [phone]) she is also in a home and unable to drive. She instructed to contact [name] patient's POA in [place]. [name] handles patient's financials and can be of more help.  Contacted [employer] [phone] and they asked for goodrx coupon to be faxed to them to see if they are able to use. They will call back if not. Medication out of pocket cost is $138

## 2020-05-25 NOTE — Telephone Encounter (Signed)
Please contact the patient and inform her that her recent urine culture grew 2 different bacteria. The first is susceptible to the Bactrim I prescribed, however the second is more resistant. Given her Levaquin allergy, the only other available oral antibiotic that may treat this second bacteria is fosfomycin. I have sent a prescription for fosfomycin to her pharmacy and we will plan to recheck her urine at her next clinic visit.

## 2020-06-21 ENCOUNTER — Encounter: Payer: Self-pay | Admitting: Physician Assistant

## 2020-06-21 ENCOUNTER — Ambulatory Visit (INDEPENDENT_AMBULATORY_CARE_PROVIDER_SITE_OTHER): Payer: Medicare Other | Admitting: Physician Assistant

## 2020-06-21 ENCOUNTER — Other Ambulatory Visit: Payer: Self-pay

## 2020-06-21 VITALS — BP 126/90 | HR 96 | Ht 60.0 in | Wt 175.0 lb

## 2020-06-21 DIAGNOSIS — N319 Neuromuscular dysfunction of bladder, unspecified: Secondary | ICD-10-CM

## 2020-06-21 NOTE — Progress Notes (Signed)
06/21/2020 3:05 PM   Victoria Medina [birth year] [id number]  CC: Chief Complaint  Patient presents with  . Other    HPI: Victoria Medina is a 59 y.o. female with a history of neurogenic bladder previously managed by CIC and painful bladder spasms who presents today for symptom recheck on Myrbetriq 25 mg daily.  I saw her in clinic most recently on 05/17/2020.  At that visit, she reported concerns for urinary retention secondary to inadequate frequency of intermittent catheterization.  Additionally, urine culture ultimately grew Klebsiella pneumoniae and Pseudomonas aeruginosa; I treated her with Bactrim DS twice daily x5 days and fosfomycin 3 g x 1 dose.  Today she reports no improvement in bladder spasms following antibiotics.  Additionally, an indwelling Foley catheter has been placed since her last clinic visit.  She is unsure when this occurred but states it was placed at her request.  She states she is much happier with the catheter in place and would like to keep a chronic Foley, as it reduces her frequency of urination and eliminates her concern for infrequent CIC.  Patient wishes to continue monthly Foley catheter changes in our clinic, stating she does not trust the staff at [facility] to do this as scheduled.  PMH: Past Medical History:  Diagnosis Date  . Blind   . Diabetes mellitus without complication (HCC)   . Hyperlipemia   . Hypertension   . Stroke [facility])     Surgical History: Past Surgical History:  Procedure Laterality Date  . ABDOMINAL HYSTERECTOMY    . BACK SURGERY    . CORONARY/GRAFT ACUTE MI REVASCULARIZATION N/A 01/25/2020   Procedure: Coronary/Graft Acute MI Revascularization;  Surgeon: Iran Ouch, MD;  Location: [facility] INVASIVE CV LAB;  Service: Cardiovascular;  Laterality: N/A;  . FRACTURE SURGERY    . LEFT HEART CATH AND CORONARY ANGIOGRAPHY N/A 01/25/2020   Procedure: LEFT HEART CATH AND CORONARY ANGIOGRAPHY;  Surgeon: Iran Ouch, MD;  Location:  [facility] INVASIVE CV LAB;  Service: Cardiovascular;  Laterality: N/A;  . SKIN GRAFT Right     Home Medications:  Allergies as of 06/21/2020      Reactions   Levaquin [levofloxacin In D5w] Anaphylaxis, Swelling   Iodinated Diagnostic Agents Itching   Severe itching   Latex Dermatitis      Medication List       Accurate as of June 21, 2020  3:05 PM. If you have any questions, ask your nurse or doctor.        aspirin EC 81 MG tablet Take 81 mg by mouth daily. Swallow whole.   Azelastine HCl 0.15 % Soln Place 2 sprays into both nostrils 2 (two) times daily.   bisacodyl 10 MG suppository Commonly known as: DULCOLAX Place 10 mg rectally as needed for moderate constipation.   clonazePAM 0.5 MG tablet Commonly known as: KLONOPIN Take 1 tablet (0.5 mg total) by mouth every 8 (eight) hours as needed for anxiety. *limit 3 doses per 24 hour*   lansoprazole 15 MG capsule Commonly known as: PREVACID Take 15 mg by mouth daily at 12 noon.   levothyroxine 25 MCG tablet Commonly known as: SYNTHROID Take 25 mcg by mouth daily before breakfast.   losartan 25 MG tablet Commonly known as: COZAAR Take 1 tablet (25 mg total) by mouth daily.   metFORMIN 500 MG tablet Commonly known as: GLUCOPHAGE Take 500 mg by mouth 2 (two) times daily.   mirabegron ER 25 MG Tb24 tablet Commonly known as: Myrbetriq Take 1  tablet (25 mg total) by mouth daily.   nitroGLYCERIN 0.4 MG SL tablet Commonly known as: NITROSTAT Place 0.4 mg under the tongue every 5 (five) minutes x 3 doses as needed for chest pain.   Olopatadine HCl 0.2 % Soln Place 1 drop into both eyes daily at 6 (six) AM.   ondansetron 4 MG tablet Commonly known as: ZOFRAN Take 4 mg by mouth every 8 (eight) hours as needed for nausea or vomiting.   ticagrelor 90 MG Tabs tablet Commonly known as: BRILINTA Take 1 tablet (90 mg total) by mouth 2 (two) times daily.   tiZANidine 2 MG tablet Commonly known as: ZANAFLEX Take 2 mg by  mouth 3 (three) times daily as needed for muscle spasms.   vitamin B-12 1000 MCG tablet Commonly known as: CYANOCOBALAMIN Take 1,000 mcg by mouth daily.   Vitamin D (Ergocalciferol) 1.25 MG (50000 UNIT) Caps capsule Commonly known as: DRISDOL Take 50,000 Units by mouth every 7 (seven) days.       Allergies:  Allergies  Allergen Reactions  . Levaquin [Levofloxacin In D5w] Anaphylaxis and Swelling  . Iodinated Diagnostic Agents Itching    Severe itching  . Latex Dermatitis    Family History: Family History  Problem Relation Age of Onset  . Heart attack Mother   . Hypertension Mother   . Hypercholesterolemia Mother   . Diabetes Mother   . Stroke Father   . Heart attack Father   . Hypertension Father   . Hypercholesterolemia Father   . Diabetes Father   . Diabetes Maternal Grandmother   . Cancer - Other Maternal Grandmother     Social History:   reports that she has never smoked. She has never used smokeless tobacco. She reports that she does not drink alcohol and does not use drugs.  Physical Exam: BP 126/90   Pulse 96   Ht 5' (1.524 m)   Wt 175 lb (79.4 kg)   BMI 34.18 kg/m   Constitutional:  Alert and oriented, no acute distress, nontoxic appearing HEENT: [place], AT Cardiovascular: No clubbing, cyanosis, or edema Respiratory: Normal respiratory effort, no increased work of breathing Skin: No rashes, bruises or suspicious lesions Neurologic: Grossly intact, no focal deficits, moving all 4 extremities Psychiatric: Normal mood and affect  Assessment & Plan:   1. Neurogenic bladder Okay to continue chronic indwelling Foley per patient preference.  I exchanged the Foley catheter in clinic today, see separate procedure note for details.  We will plan for catheter exchanges monthly in our clinic.  I explained the chronic indwelling Foley catheter increases her risk for urinary tract infection and chronic irritation from a Foley catheter may increase the risk for  bladder cancer such that she will require surveillance cystoscopy every 5 years.  She expressed understanding and wishes to proceed.  Silicone catheter placed today due to her reported latex allergy, however she arrived to clinic with a latex catheter in place and appeared to be tolerating this well.  I explained that silicone catheters are stiffer in nature and may be more uncomfortable.  Ultimately, she may benefit from SP tube placement if she cannot tolerate silicone catheters.  Return in about 4 weeks (around 07/19/2020) for Catheter exchange.  Carman Ching, PA-C  [facility] [address] [place], [place] [address] [phone]

## 2020-06-21 NOTE — Progress Notes (Signed)
Cath Change/ Replacement  Patient is present today for a catheter change due to urinary retention.  83ml of water was removed from the balloon, a 16FR latex foley cath was removed without difficulty.  Patient was cleaned and prepped in a sterile fashion with betadine. A 16 FR silicone foley cath was replaced into the bladder no complications were noted Urine return was noted 44ml and urine was yellow in color. The balloon was filled with 72ml of sterile water. A night bag was attached for drainage.  Patient declined additional drainage bags.   Performed by: Carman Ching, PA-C   Follow up: Return in about 4 weeks (around 07/19/2020) for Catheter exchange.

## 2020-06-28 ENCOUNTER — Telehealth: Payer: Self-pay

## 2020-06-28 NOTE — Telephone Encounter (Signed)
Elnita Maxwell from [facility] [phone]) called wanting to know if patient could be switched back to a latex foley. At her last exchange here in the office she was switched from latex to silicone and since patient is very uncomfortable. Ok to give verbal orders to change back?

## 2020-06-29 NOTE — Progress Notes (Addendum)
Office Visit    Patient Name: Victoria Medina Date of Encounter: 06/30/2020  Primary Care Provider:  Karie Schwalbe, MD Primary Cardiologist:  Lorine Bears, MD Electrophysiologist:  None   Chief Complaint    Victoria Medina is a 59 y.o. female with a hx of CAD with extensive stenting in 2015 and STEMI 12/2019, DM2, HTN, HLD, CVA with left-sided hemiplegia, blindness presents today for follow up of CAD.  Past Medical History    Past Medical History:  Diagnosis Date  . Blind   . Diabetes mellitus without complication (HCC)   . Hyperlipemia   . Hypertension   . Stroke [facility])    Past Surgical History:  Procedure Laterality Date  . ABDOMINAL HYSTERECTOMY    . BACK SURGERY    . CARDIAC CATHETERIZATION    . CORONARY/GRAFT ACUTE MI REVASCULARIZATION N/A 01/25/2020   Procedure: Coronary/Graft Acute MI Revascularization;  Surgeon: Iran Ouch, MD;  Location: [facility] INVASIVE CV LAB;  Service: Cardiovascular;  Laterality: N/A;  . FRACTURE SURGERY    . LEFT HEART CATH AND CORONARY ANGIOGRAPHY N/A 01/25/2020   Procedure: LEFT HEART CATH AND CORONARY ANGIOGRAPHY;  Surgeon: Iran Ouch, MD;  Location: [facility] INVASIVE CV LAB;  Service: Cardiovascular;  Laterality: N/A;  . SKIN GRAFT Right    Allergies  Allergies  Allergen Reactions  . Levaquin [Levofloxacin In D5w] Anaphylaxis and Swelling  . Iodinated Diagnostic Agents Itching    Severe itching  . Latex Dermatitis    History of Present Illness    Victoria Medina is a 59 y.o. female with a hx of CAD with extensive stenting in 2015  and STEMI 12/2019, ischemic cardiomyopathy, DM2, HTN, HLD, CVA with left-sided hemiplegia, blindness last seen 02/04/20. She is a resident at [facility].   Previous cardaic history includes CAD with NSTEMI and pCI with DES to 90% LAD and 90% RCA in 09/2013. During that admission she had echo showing LVEF 35%, akinesis of septum, apex, inferior walls. She was readmitted 11/2013 and received stent to mLAD and  dLAD. She was readmitted 12/2013 with STEMI (inferior ST elevation II, III, aVF) with reciprocal ST depressions in aVL, V2-V5. She was sent the cath lab with DES to OM1.    Her blood pressure has been difficult to control with multiple medication intolerances.  Seen in ED 01/22/20 for chest pain and arm pain. CXR with aortic atherosclerosis and no acute findings. Cardiac enzymes negative x2. She was discharged home and declined CT scan. Seen in ED 01/24/20 for recurrent right upper chest pain. Troponin uptrending from 244 - 436. She was started on Heparin. However, she left the ED as she preferred treatment with palliative care approach. She re-presented to the ED 01/25/20 and was agreeable to cardiac catheterization. Her LHC showed significant underlying three vessel disease with multiple stents in LAD, LCx, RCA.  Multiple areas of significant stenosis between stented segments. Her EF was reduced with LVEF 30-35% by cath. Underwent PCI/DES to the mid LCx into the OM3. Recommended for DAPT Aspirin/Brilinta for 12 months. Her echo 01/26/20 showed LVEF 40-45%, wall motion abnormalities, mild LVH, no significant valvular abnormalities.   She was seen in follow up 02/04/20 with her mother. Her primary goal at that visit was to see palliative care as she was not interested in additional medical interventions. She was chest pain free. She was agreeable to continue Brilinta and Aspirin after her recent stent.   She was seen in the ED 05/09/20 with chest pain. She  woke that morning with sharp pleuritic left-sided chest pain which resolved after 2 nitroglycerin. Her HS troponin was negative and EKG non-ischemic.   Presents today for follow up. Tells me her anginal equivalent is left arm pain. She reports no arm pain, chest pain, pressure, tightness. Reports no shortness of breath nor dyspnea on exertion.  edema, orthopnea, PND. Reports no palpitations. Her chief complaint today is that her urinary catheter is  uncomfortable and encouraged to discuss with her urologist at her upcoming appointment.   EKGs/Labs/Other Studies Reviewed:   The following studies were reviewed today:  LHC 01/25/2020:  There is moderate to severe left ventricular systolic dysfunction.  LV end diastolic pressure is severely elevated.  Prox RCA lesion is 10% stenosed.  Mid RCA lesion is 50% stenosed.  Dist RCA lesion is 30% stenosed.  Prox LAD lesion is 20% stenosed.  Prox LAD to Mid LAD lesion is 90% stenosed.  Mid LAD to Dist LAD lesion is 20% stenosed.  Mid LAD-1 lesion is 20% stenosed.  Mid LAD-2 lesion is 70% stenosed.  2nd Diag lesion is 80% stenosed.  Prox Cx lesion is 40% stenosed.  Mid Cx lesion is 100% stenosed.  3rd Mrg lesion is 20% stenosed.  Post intervention, there is a 0% residual stenosis.  A drug-eluting stent was successfully placed using a STENT RESOLUTE ONYX 2.25X26.  Dist Cx lesion is 70% stenosed.   1.  Significant underlying three-vessel coronary artery disease with multiple stents in the LAD, left circumflex and right coronary artery.  The culprit for myocardial infarction is an occluded mid left circumflex at the mid to distal edge of the previously placed stent with diffuse disease extending into the stented segment of OM 3.  Patent LAD stents.  However, multiple areas of significant stenosis between the stented segments. 2.  Moderately to severely reduced LV systolic function with an EF of 30 to 35%.  Severely elevated left ventricular end-diastolic pressure at 32 mmHg. 3.  Successful angioplasty and drug-eluting stent placement to the mid left circumflex extending into OM 3 to overlap with the previously placed stent.   Recommendations: Dual antiplatelet therapy for at least 1 year. Aggressive treatment of risk factors. The patient is likely not a candidate for further revascularization given her comorbidities and desire for conservative approach.   Please note that there  was delay in bringing the patient to the Cath Lab given that I had to have a discussion with her about the risks and benefits of cardiac catheterization as she refused any further evaluation the day before and she left the hospital to pursue palliative care.  The patient agreed to have catheterization done when her chest pain worsened with no relief with narcotic medications.   __________   2D echo 01/26/2020: 1. Left ventricular ejection fraction, by estimation, is 40 to 45%. The  left ventricle has mildly decreased function. The left ventricle  demonstrates regional wall motion abnormalities (see scoring  diagram/findings for description). There is mild left  ventricular hypertrophy of the basal-septal segment. Left ventricular  diastolic parameters were normal. There is moderate hypokinesis of the  left ventricular, entire inferior wall and inferolateral wall.   2. Right ventricular systolic function is normal. The right ventricular  size is normal. Tricuspid regurgitation signal is inadequate for assessing  PA pressure.   3. The mitral valve is normal in structure. Trivial mitral valve  regurgitation. No evidence of mitral stenosis.   4. The aortic valve is normal in structure. Aortic valve regurgitation  is  not visualized. No aortic stenosis is present.   5. The inferior vena cava is normal in size with greater than 50%  respiratory variability, suggesting right atrial pressure of 3 mmHg.    EKG:  EKG is not ordered today.  The ekg independently reviewed from 05/10/20 demonstrated NSR with stable posterior ST/T wave changes, no acute St/T wave changes.   Recent Labs: 01/22/2020: ALT 12; B Natriuretic Peptide 73.1 01/27/2020: Magnesium 1.8 05/09/2020: BUN 13; Creatinine, Ser 0.84; Hemoglobin 9.7; Platelets 501; Potassium 4.0; Sodium 138  Recent Lipid Panel    Component Value Date/Time   CHOL 310 (H) 01/26/2020 0600   TRIG 269 (H) 01/26/2020 0600   HDL 59 01/26/2020 0600   CHOLHDL  5.3 01/26/2020 0600   VLDL 54 (H) 01/26/2020 0600   LDLCALC 197 (H) 01/26/2020 0600    Home Medications   Current Meds  Medication Sig  . Azelastine HCl 0.15 % SOLN Place 2 sprays into both nostrils 2 (two) times daily.  . bisacodyl (DULCOLAX) 10 MG suppository Place 10 mg rectally as needed for moderate constipation.  . clonazePAM (KLONOPIN) 0.5 MG tablet Take 0.5 mg by mouth at bedtime.  . lansoprazole (PREVACID) 15 MG capsule Take 15 mg by mouth daily at 12 noon.   Marland Kitchen levothyroxine (SYNTHROID) 25 MCG tablet Take 25 mcg by mouth daily before breakfast.  . losartan (COZAAR) 25 MG tablet Take 1 tablet (25 mg total) by mouth daily.  . magnesium citrate SOLN Take 5 mLs by mouth as needed for severe constipation.  . metFORMIN (GLUCOPHAGE) 500 MG tablet Take 500 mg by mouth 2 (two) times daily.  . mirabegron ER (MYRBETRIQ) 25 MG TB24 tablet Take 1 tablet (25 mg total) by mouth daily.  . nitroGLYCERIN (NITROSTAT) 0.4 MG SL tablet Place 0.4 mg under the tongue every 5 (five) minutes x 3 doses as needed for chest pain.   Marland Kitchen ondansetron (ZOFRAN) 4 MG tablet Take 4 mg by mouth every 8 (eight) hours as needed for nausea or vomiting.  . sennosides-docusate sodium (SENOKOT-S) 8.6-50 MG tablet Take 1 tablet by mouth in the morning and at bedtime.  . ticagrelor (BRILINTA) 90 MG TABS tablet Take 1 tablet (90 mg total) by mouth 2 (two) times daily.  Marland Kitchen tiZANidine (ZANAFLEX) 2 MG tablet Take 2 mg by mouth 3 (three) times daily as needed for muscle spasms.  . vitamin B-12 (CYANOCOBALAMIN) 1000 MCG tablet Take 1,000 mcg by mouth daily.  . Vitamin D, Ergocalciferol, (DRISDOL) 50000 units CAPS capsule Take 50,000 Units by mouth every 7 (seven) days.     Review of Systems    All other systems reviewed and are otherwise negative except as noted above.  Physical Exam   VS:  BP 110/82 (BP Location: Left Arm, Patient Position: Sitting, Cuff Size: Normal)   Pulse 88   Ht 5' 1.5" (1.562 m)   Wt 160 lb (72.6 kg)    SpO2 97%   BMI 29.74 kg/m  , BMI Body mass index is 29.74 kg/m. GEN: Well nourished, overweight, well developed, in no acute distress. HEENT: normal. Neck: Supple, no JVD, carotid bruits, or masses. Cardiac: RRR, no murmurs, rubs, or gallops. No clubbing, cyanosis, edema.  Radials/DP/PT 2+ and equal bilaterally.  Respiratory:  Respirations regular and unlabored, clear to auscultation bilaterally. GI: Soft, nontender, nondistended. MS: No deformity or atrophy. Skin: Warm and dry, no rash.  Neuro:  L sided hemiplegia.  Psych: Normal affect.  Assessment & Plan   1. CAD -  Multiple previous stents with significant stenosis in-between stented segments with STEMI 12/2019 with DES to mid LCx into the OM3. Recommended for DAPT x12 months with Aspirin and Brilinta. Denies anginal symptoms. She is interested in continuing DAPT past her one year mark and we discussed reducing Brilinta to 60mg  BID at her follow up in June. Of note, Aspirin EC was not on her medication list - asked [facility] to resume Aspirin EC 81mg  daily. Will defer addition of beta blocker due to low normal BP.   2. HFrEF secondary to ICM - EF 40-45% by echocardiogram 01/26/20. Euvolemic and well compensated. No indication for loop diuretic at this time. Continue Losartan, Brilinta, Aspirin. Defer addition of beta blocker due to low normal BP.  3. Hx of CVA - With subsequent L sided hemiplegia and blindness. Continue Aspirin. Declines statin, as below.   4. HTN - BP well controlled. Reports no lightheadedness, dizziness with low normal BP.   5. HLD, LDL goal <70 - She declines statin therapy despite CAD. LDL while admitted of 197.  Disposition: Follow up June 2022 with Dr. 01/28/20, NP 06/30/2020, 11:00 AM

## 2020-06-29 NOTE — Telephone Encounter (Signed)
Notified Victoria Medina that it was OK to return to latex foley catheters if needed. Victoria Medina also would like to know if patient needs to keep her cath change appt scheduled for 12/22 since she can have [facility]. Please advise.

## 2020-06-29 NOTE — Telephone Encounter (Signed)
Yes, okay to change back to latex.  She has a latex allergy, however has tolerated latex catheters well.

## 2020-06-29 NOTE — Telephone Encounter (Signed)
Yes, she may have monthly Foley exchanges at [facility]. Patient requested this to be done in our clinic, but no reason it can't be done there.

## 2020-06-30 ENCOUNTER — Encounter: Payer: Self-pay | Admitting: Family

## 2020-06-30 ENCOUNTER — Other Ambulatory Visit: Payer: Self-pay

## 2020-06-30 ENCOUNTER — Ambulatory Visit (INDEPENDENT_AMBULATORY_CARE_PROVIDER_SITE_OTHER): Payer: Medicare Other | Admitting: Family

## 2020-06-30 VITALS — BP 110/82 | HR 88 | Ht 61.5 in | Wt 160.0 lb

## 2020-06-30 DIAGNOSIS — Z8673 Personal history of transient ischemic attack (TIA), and cerebral infarction without residual deficits: Secondary | ICD-10-CM

## 2020-06-30 DIAGNOSIS — E785 Hyperlipidemia, unspecified: Secondary | ICD-10-CM | POA: Diagnosis not present

## 2020-06-30 DIAGNOSIS — I255 Ischemic cardiomyopathy: Secondary | ICD-10-CM | POA: Diagnosis not present

## 2020-06-30 DIAGNOSIS — I25118 Atherosclerotic heart disease of native coronary artery with other forms of angina pectoris: Secondary | ICD-10-CM | POA: Diagnosis not present

## 2020-06-30 DIAGNOSIS — I10 Essential (primary) hypertension: Secondary | ICD-10-CM

## 2020-06-30 NOTE — Patient Instructions (Addendum)
Medication Instructions:  Your physician has recommended you make the following change in your medication:   START Aspirin EC 81mg  daily  CONTINUE Brilinta 90mg  twice daily  *If you need a refill on your cardiac medications before your next appointment, please call your pharmacy*   Lab Work: No medication changes today.   Testing/Procedures: None ordered today.  Follow-Up: At [facility], you and your health needs are our priority.  As part of our continuing mission to provide you with exceptional heart care, we have created designated Provider Care Teams.  These Care Teams include your primary Cardiologist (physician) and Advanced Practice Providers (APPs -  Physician Assistants and Nurse Practitioners) who all work together to provide you with the care you need, when you need it.  We recommend signing up for the patient portal called "MyChart".  Sign up information is provided on this After Visit Summary.  MyChart is used to connect with patients for Virtual Visits (Telemedicine).  Patients are able to view lab/test results, encounter notes, upcoming appointments, etc.  Non-urgent messages can be sent to your provider as well.   To learn more about what you can do with MyChart, go to .    Your next appointment:   July 2021   The format for your next appointment:   In Person  Provider:   You may see [email or web address], MD or one of the following Advanced Practice Providers on your designated Care Team:    August 2021, NP  Lorine Bears, PA-C  Nicolasa Ducking, PA-C  Cadence [place], Marisue Ivan  [place], NP

## 2020-06-30 NOTE — Telephone Encounter (Signed)
Elnita Maxwell gone for the day, wont be back till Friday, will try to relay message then.

## 2020-07-06 NOTE — Telephone Encounter (Signed)
Notified Elnita Maxwell at [facility] that patient may have catheter changes done at [facility]. Elnita Maxwell request a copy of patients culture results for patients insurance to cover Fosfomycin back in October. Culture faxed to [phone].

## 2020-07-07 ENCOUNTER — Other Ambulatory Visit: Payer: Self-pay

## 2020-07-07 ENCOUNTER — Non-Acute Institutional Stay: Payer: Medicaid Other | Admitting: Adult Health Nurse Practitioner

## 2020-07-07 DIAGNOSIS — I25119 Atherosclerotic heart disease of native coronary artery with unspecified angina pectoris: Secondary | ICD-10-CM

## 2020-07-07 DIAGNOSIS — Z515 Encounter for palliative care: Secondary | ICD-10-CM

## 2020-07-07 DIAGNOSIS — I69354 Hemiplegia and hemiparesis following cerebral infarction affecting left non-dominant side: Secondary | ICD-10-CM

## 2020-07-07 NOTE — Progress Notes (Signed)
Therapist, nutritional Palliative Care Consult Note Telephone: [phone]  Fax: [phone]  PATIENT NAME: Victoria Medina DOB: 08-12-1960 MRN: [id number]  PRIMARY CARE PROVIDER:   Karie Schwalbe, MD  REFERRING PROVIDER:  Karie Schwalbe, MD [address] [place],  [place] [address]  RESPONSIBLE PARTY:   Self Victoria Medina, sister [phone] (lives in [place]) Victoria Medina, mother [phone]  Chief complaint: Follow-up palliative visit/complex medical decision making   RECOMMENDATIONS and PLAN: 1.Advance care planning. Patient is DNR.  Today discussed whether patient wants to be sent out of the facility to go to the hospital in the event that she needed to.  Patient endorses that she wants no life prolonging procedures.  Endorses that her life ended years ago when she had her strokes and ended up in the facility.  Went over MOST form today and patient opted for DNR, comfort measures, antibiotics and IV fluids as indicated, and no feeding tube.  Had discussion that comfort measures and that she would be made comfortable at facility and not taken to the hospital.  Micah Flesher over these options with her 3 different times to make sure she understood what she was agreeing to.  Discussed these wishes with social worker at facility and with her sister via phone.  Do understand that patient does change her mind about going to the hospital when something acute is going on.  Patient blind and unable to sign for herself.  Her sister is her POA and she lives in [place].  Discussed her sister's wishes with her and she is in agreement with signing the MOST form.  Talked with social worker about best way to get this signed.  Have sent filled out MOST form to her sister in [place] via mail for her to sign.  Included stamped addressed envelope for her to return the MOST form to social worker at facility.  Spent more than 20 minutes going over ACP.  2.  Left  hemiparesis s/p CVA.  Patient is bed and wheelchair bound.  Requires assistance with ADLs.  Has chronic Foley catheter.  She is currently at baseline with no new decline noted by staff.  Continue supportive care at facility  3.  CAD.  Patient does get occasional anginal pain which is relieved with as needed nitro.  See above patient states today that she no longer wants to be sent out to hospital.  Continue to respect patient's wishes  Patient states today that there is nothing that anyone can do for her and would not like to be seen by palliative at this time.  Does agree to restart palliative services if she starts declining.  We will reach out to PCP with this request.   HISTORY OF PRESENT ILLNESS:  Victoria Medina is a 59 y.o. year old female with multiple medical problems including left hemiparesis and blindness s/p CVA, DMT2, atherosclerotic heart disease, HLD, hypothyroidism, GERD, CHF, h/o of MI. Palliative Care was asked to help address goals of care.  Upon chart review patient was evaluated in ED on 05/09/2020 for chest pain.  Records indicate patient was given nitro at facility for chest pain and was sent to ER for evaluation after 2 doses of nitro.  Records indicate that while in ED patient did not want further work-up and asked to be sent back to facility.  Reviewed lab work from this ED visit.  Patient states that she is fine today and has no new complaints.  States that everything is  the same.  10 point ROS asked and negative.  CODE STATUS: DNR  PPS: 30% HOSPICE ELIGIBILITY/DIAGNOSIS: TBD  PHYSICAL EXAM:  HR 84 O2 99% on room air General: NAD, frail appearing Eyes: Sclera anicteric and noninjected with no discharge noted ENMT: Moist mucous membranes Cardiovascular: regular rate and rhythm Pulmonary: Lung sounds clear; normal respiratory effort Abdomen: soft, nontender, + bowel sounds GU: Patient has chronic Foley catheter with clear light yellow urine noted in bag Extremities: no  edema, contractures of left hand Skin: no rashes on exposed skin Neurological: Weakness; alert and oriented x3; does have forgetfulness  PAST MEDICAL HISTORY:  Past Medical History:  Diagnosis Date  . Blind   . Diabetes mellitus without complication (HCC)   . Hyperlipemia   . Hypertension   . Stroke [facility])     SOCIAL HX:  Social History   Tobacco Use  . Smoking status: Never Smoker  . Smokeless tobacco: Never Used  Substance Use Topics  . Alcohol use: No    ALLERGIES:  Allergies  Allergen Reactions  . Levaquin [Levofloxacin In D5w] Anaphylaxis and Swelling  . Iodinated Diagnostic Agents Itching    Severe itching  . Latex Dermatitis     PERTINENT MEDICATIONS:  Outpatient Encounter Medications as of 07/07/2020  Medication Sig  . aspirin EC 81 MG tablet Take 81 mg by mouth daily. Swallow whole. (Patient not taking: Reported on 06/30/2020)  . Azelastine HCl 0.15 % SOLN Place 2 sprays into both nostrils 2 (two) times daily.  . bisacodyl (DULCOLAX) 10 MG suppository Place 10 mg rectally as needed for moderate constipation.  . clonazePAM (KLONOPIN) 0.5 MG tablet Take 0.5 mg by mouth at bedtime.  . lansoprazole (PREVACID) 15 MG capsule Take 15 mg by mouth daily at 12 noon.   Marland Kitchen levothyroxine (SYNTHROID) 25 MCG tablet Take 25 mcg by mouth daily before breakfast.  . losartan (COZAAR) 25 MG tablet Take 1 tablet (25 mg total) by mouth daily.  . magnesium citrate SOLN Take 5 mLs by mouth as needed for severe constipation.  . metFORMIN (GLUCOPHAGE) 500 MG tablet Take 500 mg by mouth 2 (two) times daily.  . mirabegron ER (MYRBETRIQ) 25 MG TB24 tablet Take 1 tablet (25 mg total) by mouth daily.  . nitroGLYCERIN (NITROSTAT) 0.4 MG SL tablet Place 0.4 mg under the tongue every 5 (five) minutes x 3 doses as needed for chest pain.   Marland Kitchen ondansetron (ZOFRAN) 4 MG tablet Take 4 mg by mouth every 8 (eight) hours as needed for nausea or vomiting.  . sennosides-docusate sodium (SENOKOT-S) 8.6-50 MG  tablet Take 1 tablet by mouth in the morning and at bedtime.  . ticagrelor (BRILINTA) 90 MG TABS tablet Take 1 tablet (90 mg total) by mouth 2 (two) times daily.  Marland Kitchen tiZANidine (ZANAFLEX) 2 MG tablet Take 2 mg by mouth 3 (three) times daily as needed for muscle spasms.  . vitamin B-12 (CYANOCOBALAMIN) 1000 MCG tablet Take 1,000 mcg by mouth daily.  . Vitamin D, Ergocalciferol, (DRISDOL) 50000 units CAPS capsule Take 50,000 Units by mouth every 7 (seven) days.   No facility-administered encounter medications on file as of 07/07/2020.      Loretta Doutt Marlena Clipper, NP

## 2020-07-15 DIAGNOSIS — I25119 Atherosclerotic heart disease of native coronary artery with unspecified angina pectoris: Secondary | ICD-10-CM

## 2020-07-15 DIAGNOSIS — E1159 Type 2 diabetes mellitus with other circulatory complications: Secondary | ICD-10-CM

## 2020-07-15 DIAGNOSIS — N319 Neuromuscular dysfunction of bladder, unspecified: Secondary | ICD-10-CM

## 2020-07-15 DIAGNOSIS — G8194 Hemiplegia, unspecified affecting left nondominant side: Secondary | ICD-10-CM

## 2020-07-15 DIAGNOSIS — F39 Unspecified mood [affective] disorder: Secondary | ICD-10-CM

## 2020-07-15 DIAGNOSIS — I5022 Chronic systolic (congestive) heart failure: Secondary | ICD-10-CM

## 2020-07-20 NOTE — Progress Notes (Signed)
Cath Change/ Replacement  Patient is present today for a catheter change due to urinary retention.  10 ml of water was removed from the balloon, a 16 FR Silicone foley cath was removed with out difficulty.  Patient was cleaned and prepped in a sterile fashion with betadine. A 16 FR foley cath was replaced into the bladder no complications were noted Urine return was noted 30 ml and urine was yellow clear in color. The balloon was filled with 34ml of sterile water. A night bag was attached for drainage.  Patient was given proper instruction on catheter care.    Performed by: Michiel Cowboy, PA-C  Follow up: One month for catheter exchange

## 2020-07-21 ENCOUNTER — Other Ambulatory Visit: Payer: Self-pay

## 2020-07-21 ENCOUNTER — Ambulatory Visit (INDEPENDENT_AMBULATORY_CARE_PROVIDER_SITE_OTHER): Payer: Medicare Other | Admitting: Urology

## 2020-07-21 DIAGNOSIS — Z466 Encounter for fitting and adjustment of urinary device: Secondary | ICD-10-CM

## 2020-07-21 MED ORDER — PREMARIN 0.625 MG/GM VA CREA: TOPICAL_CREAM | VAGINAL | 12 refills | Status: DC

## 2020-07-21 NOTE — Patient Instructions (Addendum)
Indwelling Urinary Catheter Care, Adult An indwelling urinary catheter is a thin tube that is put into your bladder. The tube helps to drain pee (urine) out of your body. The tube goes in through your urethra. Your urethra is where pee comes out of your body. Your pee will come out through the catheter, then it will go into a bag (drainage bag). Take good care of your catheter so it will work well. How to wear your catheter and bag Supplies needed  Sticky tape (adhesive tape) or a leg strap.  Alcohol wipe or soap and water (if you use tape).  A clean towel (if you use tape).  Large overnight bag.  Smaller bag (leg bag). Wearing your catheter Attach your catheter to your leg with tape or a leg strap.  Make sure the catheter is not pulled tight.  If a leg strap gets wet, take it off and put on a dry strap.  If you use tape to hold the bag on your leg: 1. Use an alcohol wipe or soap and water to wash your skin where the tape made it sticky before. 2. Use a clean towel to pat-dry that skin. 3. Use new tape to make the bag stay on your leg. Wearing your bags You should have been given a large overnight bag.  You may wear the overnight bag in the day or night.  Always have the overnight bag lower than your bladder.  Do not let the bag touch the floor.  Before you go to sleep, put a clean plastic bag in a wastebasket. Then hang the overnight bag inside the wastebasket. You should also have a smaller leg bag that fits under your clothes.  Always wear the leg bag below your knee.  Do not wear your leg bag at night. How to care for your skin and catheter Supplies needed  A clean washcloth.  Water and mild soap.  A clean towel. Caring for your skin and catheter      Clean the skin around your catheter every day: 1. Wash your hands with soap and water. 2. Wet a clean washcloth in warm water and mild soap. 3. Clean the skin around your urethra.  If you are  female:  Gently spread the folds of skin around your vagina (labia).  With the washcloth in your other hand, wipe the inner side of your labia on each side. Wipe from front to back.  If you are female:  Pull back any skin that covers the end of your penis (foreskin).  With the washcloth in your other hand, wipe your penis in small circles. Start wiping at the tip of your penis, then move away from the catheter.  Move the foreskin back in place, if needed. 4. With your free hand, hold the catheter close to where it goes into your body.  Keep holding the catheter during cleaning so it does not get pulled out. 5. With the washcloth in your other hand, clean the catheter.  Only wipe downward on the catheter.  Do not wipe upward toward your body. Doing this may push germs into your urethra and cause infection. 6. Use a clean towel to pat-dry the catheter and the skin around it. Make sure to wipe off all soap. 7. Wash your hands with soap and water.  Shower every day. Do not take baths.  Do not use cream, ointment, or lotion on the area where the catheter goes into your body, unless your doctor tells you   to.  Do not use powders, sprays, or lotions on your genital area.  Check your skin around the catheter every day for signs of infection. Check for: ? Redness, swelling, or pain. ? Fluid or blood. ? Warmth. ? Pus or a bad smell. How to empty the bag Supplies needed  Rubbing alcohol.  Gauze pad or cotton ball.  Tape or a leg strap. Emptying the bag Pour the pee out of your bag when it is ?- full, or at least 2-3 times a day. Do this for your overnight bag and your leg bag. 1. Wash your hands with soap and water. 2. Separate (detach) the bag from your leg. 3. Hold the bag over the toilet or a clean pail. Keep the bag lower than your hips and bladder. This is so the pee (urine) does not go back into the tube. 4. Open the pour spout. It is at the bottom of the bag. 5. Empty the  pee into the toilet or pail. Do not let the pour spout touch any surface. 6. Put rubbing alcohol on a gauze pad or cotton ball. 7. Use the gauze pad or cotton ball to clean the pour spout. 8. Close the pour spout. 9. Attach the bag to your leg with tape or a leg strap. 10. Wash your hands with soap and water. Follow instructions for cleaning the drainage bag:  From the product maker.  As told by your doctor. How to change the bag Supplies needed  Alcohol wipes.  A clean bag.  Tape or a leg strap. Changing the bag Replace your bag when it starts to leak, smell bad, or look dirty. 1. Wash your hands with soap and water. 2. Separate the dirty bag from your leg. 3. Pinch the catheter with your fingers so that pee does not spill out. 4. Separate the catheter tube from the bag tube where these tubes connect (at the connection valve). Do not let the tubes touch any surface. 5. Clean the end of the catheter tube with an alcohol wipe. Use a different alcohol wipe to clean the end of the bag tube. 6. Connect the catheter tube to the tube of the clean bag. 7. Attach the clean bag to your leg with tape or a leg strap. Do not make the bag tight on your leg. 8. Wash your hands with soap and water. General rules   Never pull on your catheter. Never try to take it out. Doing that can hurt you.  Always wash your hands before and after you touch your catheter or bag. Use a mild, fragrance-free soap. If you do not have soap and water, use hand sanitizer.  Always make sure there are no twists or bends (kinks) in the catheter tube.  Always make sure there are no leaks in the catheter or bag.  Drink enough fluid to keep your pee pale yellow.  Do not take baths, swim, or use a hot tub.  If you are female, wipe from front to back after you poop (have a bowel movement). Contact a doctor if:  Your pee is cloudy.  Your pee smells worse than usual.  Your catheter gets clogged.  Your catheter  leaks.  Your bladder feels full. Get help right away if:  You have redness, swelling, or pain where the catheter goes into your body.  You have fluid, blood, pus, or a bad smell coming from the area where the catheter goes into your body.  Your skin feels warm where   the catheter goes into your body.  You have a fever.  You have pain in your: ? Belly (abdomen). ? Legs. ? Lower back. ? Bladder.  You see blood in the catheter.  Your pee is pink or red.  You feel sick to your stomach (nauseous).  You throw up (vomit).  You have chills.  Your pee is not draining into the bag.  Your catheter gets pulled out. Summary  An indwelling urinary catheter is a thin tube that is placed into the bladder to help drain pee (urine) out of the body.  The catheter is placed into the part of the body that drains pee from the bladder (urethra).  Taking good care of your catheter will keep it working properly and help prevent problems.  Always wash your hands before and after touching your catheter or bag.  Never pull on your catheter or try to take it out. This information is not intended to replace advice given to you by your health care provider. Make sure you discuss any questions you have with your health care provider. Document Revised: 11/08/2018 Document Reviewed: 03/02/2017 Elsevier Patient Education  2020 [employer]  

## 2020-08-16 DIAGNOSIS — R0782 Intercostal pain: Secondary | ICD-10-CM

## 2020-08-20 ENCOUNTER — Other Ambulatory Visit: Payer: Self-pay

## 2020-08-20 ENCOUNTER — Ambulatory Visit (INDEPENDENT_AMBULATORY_CARE_PROVIDER_SITE_OTHER): Payer: Medicare Other | Admitting: Physician Assistant

## 2020-08-20 DIAGNOSIS — Z466 Encounter for fitting and adjustment of urinary device: Secondary | ICD-10-CM

## 2020-08-20 NOTE — Progress Notes (Signed)
Cath Change/ Replacement  Patient is present today for a catheter change due to urinary retention.  41ml of water was removed from the balloon, a 16FR foley cath was removed without difficulty.  Patient was cleaned and prepped in a sterile fashion with betadine. A 16 FR foley cath was replaced into the bladder no complications were noted Urine return was noted 75ml and urine was yellow in color. The balloon was filled with 90ml of sterile water. A night bag was attached for drainage.   Performed by: Carman Ching, PA-C and Franchot Erichsen, CMA  Follow up: Return in about 4 weeks (around 09/17/2020) for Catheter exchange.

## 2020-09-17 DIAGNOSIS — I25119 Atherosclerotic heart disease of native coronary artery with unspecified angina pectoris: Secondary | ICD-10-CM

## 2020-09-17 DIAGNOSIS — I69354 Hemiplegia and hemiparesis following cerebral infarction affecting left non-dominant side: Secondary | ICD-10-CM

## 2020-09-17 DIAGNOSIS — E1151 Type 2 diabetes mellitus with diabetic peripheral angiopathy without gangrene: Secondary | ICD-10-CM

## 2020-09-17 DIAGNOSIS — F1320 Sedative, hypnotic or anxiolytic dependence, uncomplicated: Secondary | ICD-10-CM

## 2020-09-17 DIAGNOSIS — K219 Gastro-esophageal reflux disease without esophagitis: Secondary | ICD-10-CM

## 2020-09-17 DIAGNOSIS — E039 Hypothyroidism, unspecified: Secondary | ICD-10-CM

## 2020-09-17 DIAGNOSIS — N311 Reflex neuropathic bladder, not elsewhere classified: Secondary | ICD-10-CM

## 2020-09-17 DIAGNOSIS — F39 Unspecified mood [affective] disorder: Secondary | ICD-10-CM

## 2020-09-20 NOTE — Progress Notes (Signed)
Cath Change/ Replacement  Patient is upset today as she states [facility] placed in overnight bag on her Foley catheter due to the frequent requests of her to empty her bag.  She is frustrated because she cannot ambulate as well without having to drag this overnight bag behind her.  Patient is present today for a catheter change due to urinary retention.  8 ml of water was removed from the balloon, a 16 FR foley cath was removed with out difficulty.  Patient was cleaned and prepped in a sterile fashion with betadine. A 16 FR foley cath was replaced into the bladder no complications were notedUrine return was noted 20 ml and urine was yellow clear in color. The balloon was filled with 39ml of sterile water. A leg bag was attached for drainage.  Patient was given proper instruction on catheter care.  A rash is noted in the inguinal area bilaterally near the buttocks.  Likely irritation from her depends.   Performed by: Michiel Cowboy, PA-C and Honor Loh, CMA  Follow up: One month for Foley catheter exchange.  Mycolog cream prescribed to be applied twice daily to the rash.

## 2020-09-21 ENCOUNTER — Telehealth: Payer: Self-pay | Admitting: *Deleted

## 2020-09-21 ENCOUNTER — Ambulatory Visit (INDEPENDENT_AMBULATORY_CARE_PROVIDER_SITE_OTHER): Payer: Medicare Other | Admitting: Urology

## 2020-09-21 ENCOUNTER — Other Ambulatory Visit: Payer: Self-pay

## 2020-09-21 DIAGNOSIS — R21 Rash and other nonspecific skin eruption: Secondary | ICD-10-CM

## 2020-09-21 DIAGNOSIS — T7840XA Allergy, unspecified, initial encounter: Secondary | ICD-10-CM

## 2020-09-21 DIAGNOSIS — Z466 Encounter for fitting and adjustment of urinary device: Secondary | ICD-10-CM

## 2020-09-21 MED ORDER — NYSTATIN-TRIAMCINOLONE 100000-0.1 UNIT/GM-% EX OINT
1.0000 | TOPICAL_OINTMENT | Freq: Two times a day (BID) | CUTANEOUS | 0 refills | Status: DC
Start: 2020-09-21 — End: 2021-01-21

## 2020-09-21 NOTE — Telephone Encounter (Signed)
[facility] is calling and states the nystatin-triamcinolone ointment (MYCOLOG) is not covered. Can you send in anything else?  [phone] [facility]

## 2020-10-03 ENCOUNTER — Other Ambulatory Visit: Payer: Self-pay

## 2020-10-03 ENCOUNTER — Encounter: Payer: Self-pay | Admitting: Emergency Medicine

## 2020-10-03 ENCOUNTER — Emergency Department
Admission: EM | Admit: 2020-10-03 | Discharge: 2020-10-03 | Disposition: A | Discharge status: Home / Self Care | Payer: Medicare Other | Attending: Emergency Medicine | Admitting: Emergency Medicine

## 2020-10-03 DIAGNOSIS — E782 Mixed hyperlipidemia: Secondary | ICD-10-CM | POA: Insufficient documentation

## 2020-10-03 DIAGNOSIS — Z79899 Other long term (current) drug therapy: Secondary | ICD-10-CM | POA: Diagnosis not present

## 2020-10-03 DIAGNOSIS — F329 Major depressive disorder, single episode, unspecified: Secondary | ICD-10-CM | POA: Insufficient documentation

## 2020-10-03 DIAGNOSIS — E1169 Type 2 diabetes mellitus with other specified complication: Secondary | ICD-10-CM | POA: Insufficient documentation

## 2020-10-03 DIAGNOSIS — Z7984 Long term (current) use of oral hypoglycemic drugs: Secondary | ICD-10-CM | POA: Diagnosis not present

## 2020-10-03 DIAGNOSIS — Z046 Encounter for general psychiatric examination, requested by authority: Secondary | ICD-10-CM | POA: Diagnosis present

## 2020-10-03 DIAGNOSIS — Z91040 Latex allergy status: Secondary | ICD-10-CM | POA: Diagnosis not present

## 2020-10-03 DIAGNOSIS — F331 Major depressive disorder, recurrent, moderate: Secondary | ICD-10-CM | POA: Diagnosis not present

## 2020-10-03 DIAGNOSIS — I10 Essential (primary) hypertension: Secondary | ICD-10-CM | POA: Diagnosis not present

## 2020-10-03 DIAGNOSIS — E039 Hypothyroidism, unspecified: Secondary | ICD-10-CM | POA: Diagnosis not present

## 2020-10-03 DIAGNOSIS — F32A Depression, unspecified: Secondary | ICD-10-CM

## 2020-10-03 NOTE — Discharge Instructions (Addendum)
Please seek medical attention and help for any thoughts about wanting to harm yourself, harm others, any concerning change in behavior, severe depression, inappropriate drug use or any other new or concerning symptoms. ° °

## 2020-10-03 NOTE — ED Provider Notes (Signed)
[facility] Emergency Department Provider Note  ____________________________________________   I have reviewed the triage vital signs and the nursing notes.   HISTORY  Chief Complaint Psychiatric Evaluation   History limited by: Not Limited   HPI Victoria Medina is a 60 y.o. female who presents to the emergency department today from living facility because of concerns for possible spider.  Patient states that she feels like her life ended when she had a stroke and heart attacks.  The patient states she has thought about how she might want to hurt herself. She says that her family has not been supportive. She denies any medical complaints.   Records reviewed. Per medical record review patient has a history of HLD, CVA.   Past Medical History:  Diagnosis Date  . Blind   . Diabetes mellitus without complication (HCC)   . Hyperlipemia   . Hypertension   . Stroke [facility])     Patient Active Problem List   Diagnosis Date Noted  . Acute ST elevation myocardial infarction (STEMI) of inferolateral wall (HCC) 01/25/2020  . NSTEMI (non-ST elevated myocardial infarction) (HCC) 01/24/2020  . Stroke (HCC)   . Diabetes mellitus without complication (HCC)   . Hypertension   . Blind   . Hypothyroidism (acquired)   . Anxiety   . GERD (gastroesophageal reflux disease) 12/18/2014  . Diabetes mellitus type 2 in obese (HCC) 01/22/2014  . Left knee pain 08/22/2013  . Impingement syndrome of right shoulder 07/07/2012  . Right shoulder pain 07/07/2012  . Obesity 06/04/2012  . Palpitations 06/04/2012  . DVT (deep venous thrombosis) (HCC) 04/03/2012  . PE (pulmonary thromboembolism) (HCC) 04/02/2012  . Cervical intraepithelial glandular neoplasia 02/07/2011  . Generalized anxiety disorder 02/07/2011  . Insomnia 02/07/2011  . Intractable chronic migraine without aura 02/07/2011  . Mixed hyperlipidemia 02/07/2011  . Myalgia and myositis, unspecified 02/07/2011  .  Obstructive sleep apnea (adult) (pediatric) 02/07/2011  . Syncope and collapse 02/07/2011    Past Surgical History:  Procedure Laterality Date  . ABDOMINAL HYSTERECTOMY    . BACK SURGERY    . CARDIAC CATHETERIZATION    . CORONARY/GRAFT ACUTE MI REVASCULARIZATION N/A 01/25/2020   Procedure: Coronary/Graft Acute MI Revascularization;  Surgeon: Iran Ouch, MD;  Location: [facility] INVASIVE CV LAB;  Service: Cardiovascular;  Laterality: N/A;  . FRACTURE SURGERY    . LEFT HEART CATH AND CORONARY ANGIOGRAPHY N/A 01/25/2020   Procedure: LEFT HEART CATH AND CORONARY ANGIOGRAPHY;  Surgeon: Iran Ouch, MD;  Location: [facility] INVASIVE CV LAB;  Service: Cardiovascular;  Laterality: N/A;  . SKIN GRAFT Right     Prior to Admission medications   Medication Sig Start Date End Date Taking? Authorizing Provider  aspirin EC 81 MG tablet Take 81 mg by mouth daily. Swallow whole. Patient not taking: Reported on 06/30/2020    [provider]  Azelastine HCl 0.15 % SOLN Place 2 sprays into both nostrils 2 (two) times daily. 01/14/20   [provider]  bisacodyl (DULCOLAX) 10 MG suppository Place 10 mg rectally as needed for moderate constipation.    [provider]  clonazePAM (KLONOPIN) 0.5 MG tablet Take 0.5 mg by mouth at bedtime.    [provider]  conjugated estrogens (PREMARIN) vaginal cream Apply 0.5mg  (pea-sized amount)  just inside the vaginal introitus with a finger-tip on  Monday, Wednesday and Friday nights. 07/21/20   Michiel Cowboy A, PA-C  lansoprazole (PREVACID) 15 MG capsule Take 15 mg by mouth daily at 12 noon.  [provider]  levothyroxine (SYNTHROID) 25 MCG tablet Take 25 mcg by mouth daily before breakfast.    [provider]  losartan (COZAAR) 25 MG tablet Take 1 tablet (25 mg total) by mouth daily. 01/28/20   Lurene Shadow, MD  magnesium citrate SOLN Take 5 mLs by mouth as needed for severe constipation.    [provider]  metFORMIN (GLUCOPHAGE) 500 MG tablet Take 500 mg by mouth 2 (two) times daily.    [provider]  mirabegron ER (MYRBETRIQ) 25 MG TB24 tablet Take 1 tablet (25 mg total) by mouth daily. 05/17/20   Vaillancourt, Lelon Mast, PA-C  nitroGLYCERIN (NITROSTAT) 0.4 MG SL tablet Place 0.4 mg under the tongue every 5 (five) minutes x 3 doses as needed for chest pain.     [provider]  nystatin-triamcinolone ointment (MYCOLOG) Apply 1 application topically 2 (two) times daily. 09/21/20   Michiel Cowboy A, PA-C  ondansetron (ZOFRAN) 4 MG tablet Take 4 mg by mouth every 8 (eight) hours as needed for nausea or vomiting.    [provider]  sennosides-docusate sodium (SENOKOT-S) 8.6-50 MG tablet Take 1 tablet by mouth in the morning and at bedtime.    [provider]  ticagrelor (BRILINTA) 90 MG TABS tablet Take 1 tablet (90 mg total) by mouth 2 (two) times daily. 01/27/20 01/26/21  Lurene Shadow, MD  tiZANidine (ZANAFLEX) 2 MG tablet Take 2 mg by mouth 3 (three) times daily as needed for muscle spasms.    [provider]  vitamin B-12 (CYANOCOBALAMIN) 1000 MCG tablet Take 1,000 mcg by mouth daily.    [provider]  Vitamin D, Ergocalciferol, (DRISDOL) 50000 units CAPS capsule Take 50,000 Units by mouth every 7 (seven) days.    [provider]    Allergies Levaquin [levofloxacin in d5w], Iodinated diagnostic agents, and Latex  Family History  Problem Relation Age of Onset  . Heart attack Mother   . Hypertension Mother   . Hypercholesterolemia Mother   . Diabetes Mother   . Stroke Father   . Heart attack Father   . Hypertension Father   . Hypercholesterolemia Father   . Diabetes Father   . Diabetes Maternal Grandmother   . Cancer - Other Maternal Grandmother     Social History Social History   Tobacco Use  . Smoking status: Never Smoker  . Smokeless tobacco: Never Used  Vaping Use  . Vaping Use: Never used   Substance Use Topics  . Alcohol use: No  . Drug use: No    Review of Systems Constitutional: No fever/chills Eyes: Chronic blindness. ENT: No sore throat. Cardiovascular: Denies chest pain. Respiratory: Denies shortness of breath. Gastrointestinal: No abdominal pain.  No nausea, no vomiting.  No diarrhea.   Genitourinary: Negative for dysuria. Musculoskeletal: Negative for back pain. Skin: Negative for rash. Neurological: Chronic left sided weakness.  ____________________________________________   PHYSICAL EXAM:  VITAL SIGNS: ED Triage Vitals [10/03/20 1203]  Enc Vitals Group     BP (!) 126/101     Pulse Rate 88     Resp 20     Temp 98.2 F (36.8 C)     Temp Source Oral     SpO2 99 %     Weight 185 lb (83.9 kg)     Height 4\' 10"  (1.473 m)     Head Circumference      Peak Flow      Pain Score 0   Constitutional: Alert and oriented.  Eyes:  Conjunctivae are normal.  ENT      Head: Normocephalic and atraumatic.      Nose: No congestion/rhinnorhea.      Mouth/Throat: Mucous membranes are moist.      Neck: No stridor. Hematological/Lymphatic/Immunilogical: No cervical lymphadenopathy. Cardiovascular: Normal rate, regular rhythm.  No murmurs, rubs, or gallops.  Respiratory: Normal respiratory effort without tachypnea nor retractions. Breath sounds are clear and equal bilaterally. No wheezes/rales/rhonchi. Gastrointestinal: Soft and non tender. No rebound. No guarding.  Genitourinary: Deferred Musculoskeletal: Normal range of motion in all extremities. No lower extremity edema. Neurologic:  Blind. Decreased strength in left upper extremity.  Skin:  Skin is warm, dry and intact. No rash noted. Psychiatric: Mood and affect are normal. Speech and behavior are normal. Patient exhibits appropriate insight and judgment.  ____________________________________________    LABS (pertinent  positives/negatives)  None  ____________________________________________   EKG  None  ____________________________________________    RADIOLOGY  None  ____________________________________________   PROCEDURES  Procedures  ____________________________________________   INITIAL IMPRESSION / ASSESSMENT AND PLAN / ED COURSE  Pertinent labs & imaging results that were available during my care of the patient were reviewed by me and considered in my medical decision making (see chart for details).   Patient presented to the emergency department today for psychiatric evaluation.  On exam patient does express what appears to be passive suicidal ideation.  Secondary to her chronic medical condition.  Will have psychiatry evaluate.  The patient has been placed in psychiatric observation due to the need to provide a safe environment for the patient while obtaining psychiatric consultation and evaluation, as well as ongoing medical and medication management to treat the patient's condition.  The patient has not been placed under full IVC at this time.   ____________________________________________   FINAL CLINICAL IMPRESSION(S) / ED DIAGNOSES  Final diagnoses:  Depression, unspecified depression type     Note: This dictation was prepared with Dragon dictation. Any transcriptional errors that result from this process are unintentional     Phineas Semen, MD 10/03/20 1342
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
## 2020-10-03 NOTE — ED Notes (Signed)
Psych team TTS and provider at bedside at this time.

## 2020-10-03 NOTE — ED Triage Notes (Addendum)
Pt presents to the ED from [facility]. Staff sent pt out for a psych evaluation. Per EMS reports staff said she was expressing thoughts of SI and HI. When asked if she has any thoughts of SI pt states, "have I felt like I wanted to die, yes. Can I do it, no. I was just joking" When asked if she thought about hurting anyone else pt states, "No but I have told nurses that I don't that I will throw things at them." Pt is A&Ox4 and NAD. Pt   EMS gave patient a Valium

## 2020-10-03 NOTE — ED Notes (Signed)
Patient refused DC VS. Patient gave verbal consent for DC d/t patient being blind and unable to sign.

## 2020-10-03 NOTE — ED Notes (Signed)
Attempted to call report to [facility].
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
## 2020-10-04 DIAGNOSIS — F39 Unspecified mood [affective] disorder: Secondary | ICD-10-CM

## 2020-10-20 ENCOUNTER — Ambulatory Visit: Payer: Self-pay | Admitting: Urology

## 2020-10-20 NOTE — Progress Notes (Deleted)
Cath Change/ Replacement  Patient is upset today as she states [facility] placed in overnight bag on her Foley catheter due to the frequent requests of her to empty her bag.  She is frustrated because she cannot ambulate as well without having to drag this overnight bag behind her.  Patient is present today for a catheter change due to urinary retention.  8 ml of water was removed from the balloon, a 16 FR foley cath was removed with out difficulty.  Patient was cleaned and prepped in a sterile fashion with betadine. A 16 FR foley cath was replaced into the bladder no complications were notedUrine return was noted 20 ml and urine was yellow clear in color. The balloon was filled with 22ml of sterile water. A leg bag was attached for drainage.  Patient was given proper instruction on catheter care.  A rash is noted in the inguinal area bilaterally near the buttocks.  Likely irritation from her depends.   Performed by: Michiel Cowboy, PA-C and Honor Loh, CMA  Follow up: One month for Foley catheter exchange.  Mycolog cream prescribed to be applied twice daily to the rash.

## 2020-10-21 ENCOUNTER — Ambulatory Visit: Payer: Medicare Other | Admitting: Urology

## 2020-10-21 DIAGNOSIS — Z466 Encounter for fitting and adjustment of urinary device: Secondary | ICD-10-CM

## 2020-10-22 ENCOUNTER — Other Ambulatory Visit: Payer: Self-pay

## 2020-10-22 ENCOUNTER — Encounter: Payer: Self-pay | Admitting: Urology

## 2020-10-22 ENCOUNTER — Ambulatory Visit (INDEPENDENT_AMBULATORY_CARE_PROVIDER_SITE_OTHER): Payer: Medicare Other | Admitting: Urology

## 2020-10-22 VITALS — BP 138/85 | HR 80 | Ht 61.0 in | Wt 185.0 lb

## 2020-10-22 DIAGNOSIS — Z466 Encounter for fitting and adjustment of urinary device: Secondary | ICD-10-CM | POA: Diagnosis not present

## 2020-10-22 NOTE — Progress Notes (Signed)
Cath Change/ Replacement  Patient is present today for a catheter change due to urinary retention.  9 ml of water was removed from the balloon, a 16 FR foley cath was removed with out difficulty.  Patient was cleaned and prepped in a sterile fashion with betadine. A 16 FR foley cath was replaced into the bladder no complications were noted   Urine return was noted 20 ml and urine was yellow clear in color. The balloon was filled with 66ml of sterile water. A leg bag was attached for drainage.  A night bag was also given to the patient and patient was given instruction on how to change from one bag to another. Patient was given proper instruction on catheter care.    Performed by: Michiel Cowboy, PA-C  Follow up: One month for Foley catheter exchange

## 2020-11-22 NOTE — Progress Notes (Signed)
Cath Change/ Replacement  Patient is present today for a catheter change due to urinary retention.  9 ml of water was removed from the balloon, a 16 FR foley cath was removed with out difficulty.  Patient was cleaned and prepped in a sterile fashion with betadine. A 16 FR foley cath was replaced into the bladder no complications were noted Urine return was noted 10 ml and urine was yellow clear in color. The balloon was filled with 64ml of sterile water.   A night bag was given to the patient.  Patient was given proper instruction on catheter care.    Performed by: Myself  Follow up: Patient states that she desires a SPT at this time, but that local anesthetics are not effective for her and she will require general anesthesia for suprapubic tube placement.  Therefore due to this requirement and her comorbidities I will refer her to [facility] for consideration of suprapubic tube placement.  She is hoping that this will result in fewer bladder spasms and her pain with the catheter, but I advised her that she still may experience catheter pain and bladder spasms with a suprapubic tube in place and that it would be trial and error to see if it would relieve the pain and spasms.

## 2020-11-23 ENCOUNTER — Other Ambulatory Visit: Payer: Self-pay

## 2020-11-23 ENCOUNTER — Ambulatory Visit (INDEPENDENT_AMBULATORY_CARE_PROVIDER_SITE_OTHER): Payer: Medicare Other | Admitting: Urology

## 2020-11-23 DIAGNOSIS — E1159 Type 2 diabetes mellitus with other circulatory complications: Secondary | ICD-10-CM

## 2020-11-23 DIAGNOSIS — G8194 Hemiplegia, unspecified affecting left nondominant side: Secondary | ICD-10-CM

## 2020-11-23 DIAGNOSIS — I25119 Atherosclerotic heart disease of native coronary artery with unspecified angina pectoris: Secondary | ICD-10-CM

## 2020-11-23 DIAGNOSIS — F39 Unspecified mood [affective] disorder: Secondary | ICD-10-CM

## 2020-11-23 DIAGNOSIS — F1320 Sedative, hypnotic or anxiolytic dependence, uncomplicated: Secondary | ICD-10-CM

## 2020-11-23 DIAGNOSIS — I5022 Chronic systolic (congestive) heart failure: Secondary | ICD-10-CM

## 2020-11-23 DIAGNOSIS — Z466 Encounter for fitting and adjustment of urinary device: Secondary | ICD-10-CM | POA: Diagnosis not present

## 2020-12-22 NOTE — Progress Notes (Signed)
Cath Change/ Replacement  Patient is present today for a catheter change due to urinary retention.  9 ml of water was removed from the balloon, a 16 FR foley cath was removed with out difficulty.  Patient was cleaned and prepped in a sterile fashion with betadine. A 16 FR foley cath was replaced into the bladder no complications were noted Urine return was noted 50 ml and urine was yellow urine in color. The balloon was filled with 69ml of sterile water. An overnight bag was attached for drainage. Patient was given proper instruction on catheter care.    Performed by: Michiel Cowboy, PA-C  Follow up: One month for Foley catheter exchange.  [facility] is going to see her in September for consultation for SPT placement.

## 2020-12-23 ENCOUNTER — Other Ambulatory Visit: Payer: Self-pay

## 2020-12-23 ENCOUNTER — Ambulatory Visit (INDEPENDENT_AMBULATORY_CARE_PROVIDER_SITE_OTHER): Payer: Medicare Other | Admitting: Urology

## 2020-12-23 DIAGNOSIS — Z466 Encounter for fitting and adjustment of urinary device: Secondary | ICD-10-CM | POA: Diagnosis not present

## 2020-12-31 DIAGNOSIS — F39 Unspecified mood [affective] disorder: Secondary | ICD-10-CM

## 2020-12-31 DIAGNOSIS — I25119 Atherosclerotic heart disease of native coronary artery with unspecified angina pectoris: Secondary | ICD-10-CM

## 2020-12-31 DIAGNOSIS — I10 Essential (primary) hypertension: Secondary | ICD-10-CM

## 2020-12-31 DIAGNOSIS — I69354 Hemiplegia and hemiparesis following cerebral infarction affecting left non-dominant side: Secondary | ICD-10-CM

## 2020-12-31 DIAGNOSIS — I5020 Unspecified systolic (congestive) heart failure: Secondary | ICD-10-CM

## 2020-12-31 DIAGNOSIS — E039 Hypothyroidism, unspecified: Secondary | ICD-10-CM

## 2020-12-31 DIAGNOSIS — K219 Gastro-esophageal reflux disease without esophagitis: Secondary | ICD-10-CM

## 2020-12-31 DIAGNOSIS — R339 Retention of urine, unspecified: Secondary | ICD-10-CM

## 2020-12-31 DIAGNOSIS — F1320 Sedative, hypnotic or anxiolytic dependence, uncomplicated: Secondary | ICD-10-CM

## 2020-12-31 DIAGNOSIS — E1159 Type 2 diabetes mellitus with other circulatory complications: Secondary | ICD-10-CM

## 2021-01-20 NOTE — Progress Notes (Signed)
Cath Change/ Replacement  Patient is present today for a catheter change due to urinary retention.  9 ml of water was removed from the balloon, a 16 FR foley cath was removed with out difficulty.  Patient was cleaned and prepped in a sterile fashion with betadine. A 16 FR foley cath was replaced into the bladder no complications were noted Urine return was noted 40 ml and urine was yellow clear in color. The balloon was filled with 38ml of sterile water. A night bag was attached for drainage.  Patient was given proper instruction on catheter care.    Performed by: Quintella Reichert, PA-S and Michiel Cowboy, PA-C  Follow up: One month for catheter exchange.  A renewal of her Mycolog script is printed for tinea crusis.

## 2021-01-21 ENCOUNTER — Other Ambulatory Visit: Payer: Self-pay

## 2021-01-21 ENCOUNTER — Ambulatory Visit (INDEPENDENT_AMBULATORY_CARE_PROVIDER_SITE_OTHER): Payer: Medicare Other | Admitting: Urology

## 2021-01-21 DIAGNOSIS — Z466 Encounter for fitting and adjustment of urinary device: Secondary | ICD-10-CM

## 2021-01-21 MED ORDER — NYSTATIN-TRIAMCINOLONE 100000-0.1 UNIT/GM-% EX OINT: 1.0000 "application " | TOPICAL_OINTMENT | Freq: Two times a day (BID) | CUTANEOUS | 0 refills | Status: DC

## 2021-02-16 ENCOUNTER — Other Ambulatory Visit: Payer: Self-pay

## 2021-02-16 ENCOUNTER — Ambulatory Visit
Admission: RE | Admit: 2021-02-16 | Discharge: 2021-02-16 | Disposition: A | Discharge status: Home / Self Care | Payer: Medicare Other | Source: Ambulatory Visit | Attending: Urology | Admitting: Urology

## 2021-02-16 DIAGNOSIS — N390 Urinary tract infection, site not specified: Secondary | ICD-10-CM | POA: Diagnosis present

## 2021-02-16 DIAGNOSIS — N319 Neuromuscular dysfunction of bladder, unspecified: Secondary | ICD-10-CM | POA: Insufficient documentation

## 2021-02-24 NOTE — Progress Notes (Signed)
Cath Change/ Replacement  Patient is present today for a catheter change due to urinary retention.  9 ml of water was removed from the balloon, a 16 FR foley cath was removed with out difficulty.  Patient was cleaned and prepped in a sterile fashion with betadine. A 16 FR foley cath was replaced into the bladder no complications were noted Urine return was noted 30 ml and urine was yellow clear in color. The balloon was filled with 60ml of sterile water. A night bag was attached for drainage.  Patient was given proper instruction on catheter care.    Performed by: Michiel Cowboy, PA-C and Emi Belfast, CMA  Follow up: One month

## 2021-02-25 ENCOUNTER — Encounter: Payer: Self-pay | Admitting: Urology

## 2021-02-25 ENCOUNTER — Ambulatory Visit (INDEPENDENT_AMBULATORY_CARE_PROVIDER_SITE_OTHER): Payer: Medicare Other | Admitting: Urology

## 2021-02-25 ENCOUNTER — Other Ambulatory Visit: Payer: Self-pay

## 2021-02-25 VITALS — BP 176/100 | HR 74 | Ht 61.0 in | Wt 185.0 lb

## 2021-02-25 DIAGNOSIS — Z466 Encounter for fitting and adjustment of urinary device: Secondary | ICD-10-CM

## 2021-03-09 ENCOUNTER — Ambulatory Visit (INDEPENDENT_AMBULATORY_CARE_PROVIDER_SITE_OTHER): Payer: Medicare Other | Admitting: Urology

## 2021-03-09 ENCOUNTER — Other Ambulatory Visit: Payer: Self-pay

## 2021-03-09 ENCOUNTER — Encounter: Payer: Self-pay | Admitting: Urology

## 2021-03-09 VITALS — BP 154/100 | HR 76 | Ht 61.0 in

## 2021-03-09 DIAGNOSIS — R339 Retention of urine, unspecified: Secondary | ICD-10-CM | POA: Diagnosis not present

## 2021-03-09 DIAGNOSIS — N319 Neuromuscular dysfunction of bladder, unspecified: Secondary | ICD-10-CM | POA: Diagnosis not present

## 2021-03-09 NOTE — Progress Notes (Signed)
03/09/2021 4:41 PM   Victoria Medina 1960/10/15 [id number]  Referring provider: Karie Schwalbe, MD [address] [place],  [place] [address]  Chief Complaint  Patient presents with   Follow-up    HPI: 60 y.o. female presents for annual follow-up.  History 2 major CVAs with urinary retention At last visit was on twice which was increased to every 8 hours daily intermittent catheterization Apparently had problems with regular intermittent catheterization and a Foley catheter was placed November 2021 and she requested a chronic indwelling Foley Catheter was last changed 02/25/2021 Complains of urethral irritation and occasional bladder spasms She states she is scheduled for an appointment at [facility] next month for consideration of suprapubic tube placement Denies recurrent UTI RUS performed last month showed no upper tract abnormalities  PMH: Past Medical History:  Diagnosis Date   Blind    Diabetes mellitus without complication (HCC)    Hyperlipemia    Hypertension    Stroke [facility])     Surgical History: Past Surgical History:  Procedure Laterality Date   ABDOMINAL HYSTERECTOMY     BACK SURGERY     CARDIAC CATHETERIZATION     CORONARY/GRAFT ACUTE MI REVASCULARIZATION N/A 01/25/2020   Procedure: Coronary/Graft Acute MI Revascularization;  Surgeon: Iran Ouch, MD;  Location: [facility] INVASIVE CV LAB;  Service: Cardiovascular;  Laterality: N/A;   FRACTURE SURGERY     LEFT HEART CATH AND CORONARY ANGIOGRAPHY N/A 01/25/2020   Procedure: LEFT HEART CATH AND CORONARY ANGIOGRAPHY;  Surgeon: Iran Ouch, MD;  Location: [facility] INVASIVE CV LAB;  Service: Cardiovascular;  Laterality: N/A;   SKIN GRAFT Right     Home Medications:  Allergies as of 03/09/2021       Reactions   Levaquin [levofloxacin In D5w] Anaphylaxis, Swelling   Iodinated Diagnostic Agents Itching   Severe itching   Latex Dermatitis        Medication List        Accurate as of March 09, 2021   4:41 PM. If you have any questions, ask your nurse or doctor.          aspirin EC 81 MG tablet Take 81 mg by mouth daily. Swallow whole.   Azelastine HCl 0.15 % Soln Place 2 sprays into both nostrils 2 (two) times daily.   bisacodyl 10 MG suppository Commonly known as: DULCOLAX Place 10 mg rectally as needed for moderate constipation.   clonazePAM 0.5 MG tablet Commonly known as: KLONOPIN Take 0.5 mg by mouth at bedtime.   diazepam 10 MG tablet Commonly known as: VALIUM Take 10 mg by mouth every 4 (four) hours.   lansoprazole 15 MG capsule Commonly known as: PREVACID Take 15 mg by mouth daily at 12 noon.   levothyroxine 25 MCG tablet Commonly known as: SYNTHROID Take 25 mcg by mouth daily before breakfast.   losartan 25 MG tablet Commonly known as: COZAAR Take 1 tablet (25 mg total) by mouth daily.   magnesium citrate Soln Take 5 mLs by mouth as needed for severe constipation.   metFORMIN 500 MG tablet Commonly known as: GLUCOPHAGE Take 500 mg by mouth 2 (two) times daily.   mirabegron ER 25 MG Tb24 tablet Commonly known as: Myrbetriq Take 1 tablet (25 mg total) by mouth daily.   nitroGLYCERIN 0.4 MG SL tablet Commonly known as: NITROSTAT Place 0.4 mg under the tongue every 5 (five) minutes x 3 doses as needed for chest pain.   nystatin-triamcinolone ointment Commonly known as: MYCOLOG Apply 1  application topically 2 (two) times daily.   ondansetron 4 MG tablet Commonly known as: ZOFRAN Take 4 mg by mouth every 8 (eight) hours as needed for nausea or vomiting.   Premarin vaginal cream Generic drug: conjugated estrogens Apply 0.5mg  (pea-sized amount)  just inside the vaginal introitus with a finger-tip on  Monday, Wednesday and Friday nights.   sennosides-docusate sodium 8.6-50 MG tablet Commonly known as: SENOKOT-S Take 1 tablet by mouth in the morning and at bedtime.   tiZANidine 2 MG tablet Commonly known as: ZANAFLEX Take 2 mg by mouth 3  (three) times daily as needed for muscle spasms.   vitamin B-12 1000 MCG tablet Commonly known as: CYANOCOBALAMIN Take 1,000 mcg by mouth daily.   Vitamin D (Ergocalciferol) 1.25 MG (50000 UNIT) Caps capsule Commonly known as: DRISDOL Take 50,000 Units by mouth every 7 (seven) days.        Allergies:  Allergies  Allergen Reactions   Levaquin [Levofloxacin In D5w] Anaphylaxis and Swelling   Iodinated Diagnostic Agents Itching    Severe itching   Latex Dermatitis    Family History: Family History  Problem Relation Age of Onset   Heart attack Mother    Hypertension Mother    Hypercholesterolemia Mother    Diabetes Mother    Stroke Father    Heart attack Father    Hypertension Father    Hypercholesterolemia Father    Diabetes Father    Diabetes Maternal Grandmother    Cancer - Other Maternal Grandmother     Social History:  reports that she has never smoked. She has never used smokeless tobacco. She reports that she does not drink alcohol and does not use drugs.   Physical Exam: BP (!) 154/100   Pulse 76   Ht 5\' 1"  (1.549 m)   BMI 34.96 kg/m   Constitutional:  Alert, No acute distress. HEENT: [place] AT, moist mucus membranes.  Trachea midline, no masses. Cardiovascular: No clubbing, cyanosis, or edema. Respiratory: Normal respiratory effort, no increased work of breathing. GU: Foley catheter draining clear urine   Pertinent Imaging:   Ultrasound renal complete  Narrative CLINICAL DATA:  Neurogenic bladder  Recurring UTI  EXAM: RENAL / URINARY TRACT ULTRASOUND COMPLETE  COMPARISON:  None.  FINDINGS: Right Kidney:  Renal measurements: 9.5 x 3.9 x 4.1 cm = volume: 80 mL. Echogenicity within normal limits. No mass or hydronephrosis visualized.  Left Kidney:  Renal measurements: 9.6 x 4.9 x 4.1 cm = volume: 100 mL. Echogenicity within normal limits. No mass or hydronephrosis visualized.  Bladder:  Limited evaluation due to collapse configuration.  Foley catheter balloon is noted within the bladder lumen.  Other:  None.  IMPRESSION: No significant sonographic abnormality of the kidneys.   Electronically Signed By: M.D. On: 02/16/2021 11:34   Assessment & Plan:    1.  Neurogenic bladder with urinary retention Continue Foley catheter drainage and annual follow-up According to patient she is scheduled next month for an appointment at [facility] to consider SP tube placement   [facility], MD  [facility] [address] [place], [place] [phone]

## 2021-03-12 ENCOUNTER — Encounter: Payer: Self-pay | Admitting: Urology

## 2021-03-14 DIAGNOSIS — E1159 Type 2 diabetes mellitus with other circulatory complications: Secondary | ICD-10-CM

## 2021-03-14 DIAGNOSIS — F39 Unspecified mood [affective] disorder: Secondary | ICD-10-CM

## 2021-03-14 DIAGNOSIS — I5022 Chronic systolic (congestive) heart failure: Secondary | ICD-10-CM

## 2021-03-14 DIAGNOSIS — G8194 Hemiplegia, unspecified affecting left nondominant side: Secondary | ICD-10-CM

## 2021-03-14 DIAGNOSIS — I25119 Atherosclerotic heart disease of native coronary artery with unspecified angina pectoris: Secondary | ICD-10-CM

## 2021-03-23 ENCOUNTER — Other Ambulatory Visit: Payer: Self-pay

## 2021-03-23 ENCOUNTER — Emergency Department
Admission: EM | Admit: 2021-03-23 | Discharge: 2021-03-23 | Disposition: A | Discharge status: Home / Self Care | Payer: Medicare Other | Attending: Emergency Medicine | Admitting: Emergency Medicine

## 2021-03-23 DIAGNOSIS — E039 Hypothyroidism, unspecified: Secondary | ICD-10-CM | POA: Insufficient documentation

## 2021-03-23 DIAGNOSIS — Z7984 Long term (current) use of oral hypoglycemic drugs: Secondary | ICD-10-CM | POA: Insufficient documentation

## 2021-03-23 DIAGNOSIS — Z7982 Long term (current) use of aspirin: Secondary | ICD-10-CM | POA: Insufficient documentation

## 2021-03-23 DIAGNOSIS — Z79899 Other long term (current) drug therapy: Secondary | ICD-10-CM | POA: Diagnosis not present

## 2021-03-23 DIAGNOSIS — R454 Irritability and anger: Secondary | ICD-10-CM | POA: Insufficient documentation

## 2021-03-23 DIAGNOSIS — I10 Essential (primary) hypertension: Secondary | ICD-10-CM | POA: Diagnosis not present

## 2021-03-23 DIAGNOSIS — Z955 Presence of coronary angioplasty implant and graft: Secondary | ICD-10-CM | POA: Insufficient documentation

## 2021-03-23 DIAGNOSIS — E119 Type 2 diabetes mellitus without complications: Secondary | ICD-10-CM | POA: Diagnosis not present

## 2021-03-23 DIAGNOSIS — F32A Depression, unspecified: Secondary | ICD-10-CM

## 2021-03-23 DIAGNOSIS — Z91040 Latex allergy status: Secondary | ICD-10-CM | POA: Insufficient documentation

## 2021-03-23 MED ORDER — IBUPROFEN 600 MG PO TABS
600.0000 mg | ORAL_TABLET | Freq: Once | ORAL | Status: AC
  Administered 2021-03-23: 600 mg via ORAL
  Filled 2021-03-23: qty 1

## 2021-03-23 MED ORDER — DIAZEPAM 5 MG PO TABS
10.0000 mg | ORAL_TABLET | Freq: Once | ORAL | Status: AC
  Administered 2021-03-23: 10 mg via ORAL
  Filled 2021-03-23: qty 2

## 2021-03-23 NOTE — ED Notes (Signed)
Called [employer] to arrange transportation waiting on [facility] to arrive

## 2021-03-23 NOTE — ED Triage Notes (Addendum)
Pt arrives via ems from [facility], pt states that "today has been a crappy day, but that most days are crappy" pt states that she has had mi's and strokes that have disabled her and caused her to go blind, pt states that today her call bell was on the floor so she was yelling out for help to empty her catheter because she states that it fills up fast. Pt states that she was angry that it took the staff a long time to respond and was venting and the aide took it that she was suicidal and needed a psych eval.  Pt states that she hates living like this so if that makes her suicidal than she guesses that she is, pt states as of 05-12-2014 everything was taken from her she is powerless, states that she is sleep deprived and states that she has asked for therapy multiple times for feeling this way

## 2021-03-23 NOTE — ED Triage Notes (Signed)
Pt comes ems from [facility] for psych eval. Pt states that she wanted her catheter emptied but no one would answer the call bell so she started yelling and staff called ems on her. Denies SI/HI/AVH at this time. Pt was cooperative for EMS.

## 2021-03-23 NOTE — ED Provider Notes (Signed)
[facility] Emergency Department Provider Note  Time seen: 6:50 PM  I have reviewed the triage vital signs and the nursing notes.   HISTORY  Chief Complaint Agitation   HPI Victoria Medina is a 60 y.o. female with a past medical history of diabetes, hypertension, hyperlipidemia, major CVA in 2015, presents to the emergency department for possible psychiatric evaluation.  According to the patient she states she got "pissed off" because nobody at her nursing facility would come empty her catheter.  Patient states she got very frustrated and admits to making statements that she is tired of living like this.  Patient states she meant that she is tired of living like this but states she would not kill herself.  Patient states she is has been asking her facility for a therapist, someone she could talk to regarding her situation.  Patient states they have offered a psychiatrist but she states she does not want to take medication she just wants someone to talk to them.  Patient again denies active suicidal ideation.  Does admit that she is tired of living like this but states she would never kill her self.   Past Medical History:  Diagnosis Date   Blind    Diabetes mellitus without complication (HCC)    Hyperlipemia    Hypertension    Stroke [facility])     Patient Active Problem List   Diagnosis Date Noted   Acute ST elevation myocardial infarction (STEMI) of inferolateral wall (HCC) 01/25/2020   NSTEMI (non-ST elevated myocardial infarction) (HCC) 01/24/2020   Stroke (HCC)    Diabetes mellitus without complication (HCC)    Hypertension    Blind    Hypothyroidism (acquired)    Anxiety    GERD (gastroesophageal reflux disease) 12/18/2014   Diabetes mellitus type 2 in obese (HCC) 01/22/2014   Left knee pain 08/22/2013   Impingement syndrome of right shoulder 07/07/2012   Right shoulder pain 07/07/2012   Obesity 06/04/2012   Palpitations 06/04/2012   DVT (deep venous  thrombosis) (HCC) 04/03/2012   PE (pulmonary thromboembolism) (HCC) 04/02/2012   Cervical intraepithelial glandular neoplasia 02/07/2011   Generalized anxiety disorder 02/07/2011   Insomnia 02/07/2011   Intractable chronic migraine without aura 02/07/2011   Mixed hyperlipidemia 02/07/2011   Myalgia and myositis, unspecified 02/07/2011   Obstructive sleep apnea (adult) (pediatric) 02/07/2011   Syncope and collapse 02/07/2011    Past Surgical History:  Procedure Laterality Date   ABDOMINAL HYSTERECTOMY     BACK SURGERY     CARDIAC CATHETERIZATION     CORONARY/GRAFT ACUTE MI REVASCULARIZATION N/A 01/25/2020   Procedure: Coronary/Graft Acute MI Revascularization;  Surgeon: Iran Ouch, MD;  Location: [facility] INVASIVE CV LAB;  Service: Cardiovascular;  Laterality: N/A;   FRACTURE SURGERY     LEFT HEART CATH AND CORONARY ANGIOGRAPHY N/A 01/25/2020   Procedure: LEFT HEART CATH AND CORONARY ANGIOGRAPHY;  Surgeon: Iran Ouch, MD;  Location: [facility] INVASIVE CV LAB;  Service: Cardiovascular;  Laterality: N/A;   SKIN GRAFT Right     Prior to Admission medications   Medication Sig Start Date End Date Taking? Authorizing Provider  aspirin EC 81 MG tablet Take 81 mg by mouth daily. Swallow whole.    [provider]  Azelastine HCl 0.15 % SOLN Place 2 sprays into both nostrils 2 (two) times daily. 01/14/20   [provider]  bisacodyl (DULCOLAX) 10 MG suppository Place 10 mg rectally as needed for moderate constipation.    [provider]  clonazePAM (KLONOPIN) 0.5 MG tablet Take 0.5 mg by mouth at bedtime.    [provider]  conjugated estrogens (PREMARIN) vaginal cream Apply 0.5mg  (pea-sized amount)  just inside the vaginal introitus with a finger-tip on  Monday, Wednesday and Friday nights. 07/21/20   Michiel Cowboy A, PA-C  diazepam (VALIUM) 10 MG tablet Take 10 mg by mouth every 4 (four) hours.    [provider]  lansoprazole (PREVACID) 15  MG capsule Take 15 mg by mouth daily at 12 noon.     [provider]  levothyroxine (SYNTHROID) 25 MCG tablet Take 25 mcg by mouth daily before breakfast.    [provider]  losartan (COZAAR) 25 MG tablet Take 1 tablet (25 mg total) by mouth daily. 01/28/20   Lurene Shadow, MD  magnesium citrate SOLN Take 5 mLs by mouth as needed for severe constipation.    [provider]  metFORMIN (GLUCOPHAGE) 500 MG tablet Take 500 mg by mouth 2 (two) times daily.    [provider]  mirabegron ER (MYRBETRIQ) 25 MG TB24 tablet Take 1 tablet (25 mg total) by mouth daily. 05/17/20   Vaillancourt, Lelon Mast, PA-C  nitroGLYCERIN (NITROSTAT) 0.4 MG SL tablet Place 0.4 mg under the tongue every 5 (five) minutes x 3 doses as needed for chest pain.     [provider]  nystatin-triamcinolone ointment (MYCOLOG) Apply 1 application topically 2 (two) times daily. 01/21/21   Michiel Cowboy A, PA-C  ondansetron (ZOFRAN) 4 MG tablet Take 4 mg by mouth every 8 (eight) hours as needed for nausea or vomiting.    [provider]  sennosides-docusate sodium (SENOKOT-S) 8.6-50 MG tablet Take 1 tablet by mouth in the morning and at bedtime.    [provider]  tiZANidine (ZANAFLEX) 2 MG tablet Take 2 mg by mouth 3 (three) times daily as needed for muscle spasms.    [provider]  vitamin B-12 (CYANOCOBALAMIN) 1000 MCG tablet Take 1,000 mcg by mouth daily.    [provider]  Vitamin D, Ergocalciferol, (DRISDOL) 50000 units CAPS capsule Take 50,000 Units by mouth every 7 (seven) days.    [provider]    Allergies  Allergen Reactions   Levaquin [Levofloxacin In D5w] Anaphylaxis and Swelling   Iodinated Diagnostic Agents Itching    Severe itching   Latex Dermatitis    Family History  Problem Relation Age of Onset   Heart attack Mother    Hypertension Mother    Hypercholesterolemia Mother    Diabetes Mother    Stroke Father     Heart attack Father    Hypertension Father    Hypercholesterolemia Father    Diabetes Father    Diabetes Maternal Grandmother    Cancer - Other Maternal Grandmother     Social History Social History   Tobacco Use   Smoking status: Never   Smokeless tobacco: Never  Vaping Use   Vaping Use: Never used  Substance Use Topics   Alcohol use: No   Drug use: No    Review of Systems Constitutional: Negative for fever. Cardiovascular: Negative for chest pain. Respiratory: Negative for shortness of breath. Gastrointestinal: Negative for abdominal pain, vomiting Genitourinary: Negative for urinary compaints Musculoskeletal: Negative for musculoskeletal complaints Neurological: Negative for headache All other ROS negative  ____________________________________________   PHYSICAL EXAM:  VITAL SIGNS: ED Triage Vitals  Enc Vitals Group     BP 03/23/21 1817 (!) 161/105     Pulse Rate 03/23/21 1817 87  Resp 03/23/21 1817 16     Temp 03/23/21 1817 98.3 F (36.8 C)     Temp Source 03/23/21 1817 Oral     SpO2 03/23/21 1817 97 %     Weight 03/23/21 1820 180 lb (81.6 kg)     Height 03/23/21 1820 5' (1.524 m)     Head Circumference --      Peak Flow --      Pain Score 03/23/21 1819 10     Pain Loc --      Pain Edu? --      Excl. in GC? --    Constitutional: Alert and oriented. Well appearing and in no distress. ENT      Head: Normocephalic and atraumatic.      Mouth/Throat: Mucous membranes are moist. Cardiovascular: Normal rate, regular rhythm. Respiratory: Normal respiratory effort without tachypnea nor retractions. Breath sounds are clear  Gastrointestinal: Soft and nontender. No distention.  Musculoskeletal: Contracted left upper extremity Neurologic:  Normal speech and language. No gross focal neurologic deficits Skin:  Skin is warm, dry and intact.  Psychiatric: Patient is somewhat upset, but denies SI.  ____________________________________________  INITIAL  IMPRESSION / ASSESSMENT AND PLAN / ED COURSE  Pertinent labs & imaging results that were available during my care of the patient were reviewed by me and considered in my medical decision making (see chart for details).   Patient presents emergency department sent from her nursing facility for suicidal statement.  Patient states she did not want to come here, states she is upset that they sent her here.  Patient told the facility that it was a waste of time that she does not want to speak to a psychiatrist that she wants a behavioral therapist.  Patient admits to making the statement however states she was just upset and she has no intention to kill her self.  Patient is understandably upset given her current medical situation and I do believe the behavioral therapist would be of benefit to the patient.  We will send a note to the social worker to see if there is anything that they could help with in regard to this.  However as the patient has no suicidal thoughts at this time and states she had no desire to kill her self.  We will allow the patient to be discharged back to her facility.  Patient agreeable to plan of care.  Ilaisaane Marts was evaluated in Emergency Department on 03/23/2021 for the symptoms described in the history of present illness. She was evaluated in the context of the global COVID-19 pandemic, which necessitated consideration that the patient might be at risk for infection with the SARS-CoV-2 virus that causes COVID-19. Institutional protocols and algorithms that pertain to the evaluation of patients at risk for COVID-19 are in a state of rapid change based on information released by regulatory bodies including the CDC and federal and state organizations. These policies and algorithms were followed during the patient's care in the ED.  ____________________________________________   FINAL CLINICAL IMPRESSION(S) / ED DIAGNOSES  Anger episode   Minna Antis, MD 03/23/21 [phone]

## 2021-03-24 DIAGNOSIS — F32A Depression, unspecified: Secondary | ICD-10-CM

## 2021-03-28 DIAGNOSIS — S01312A Laceration without foreign body of left ear, initial encounter: Secondary | ICD-10-CM

## 2021-03-28 NOTE — Progress Notes (Signed)
Cath Change/ Replacement  Patient is present today for a catheter change due to urinary retention.  9 ml of water was removed from the balloon, a 16 FR foley cath was removed with out difficulty.  Patient was cleaned and prepped in a sterile fashion with betadine. A 16 FR foley cath was replaced into the bladder no complications were noted Urine return was noted 40 ml and urine was clear in color. The balloon was filled with 71ml of sterile water. A night bag was attached for drainage.    Performed by: Honor Loh, CMA  Follow up: Appointment with [facility] 04/15/2021.  Follow up in one month for catheter exchange.

## 2021-03-29 ENCOUNTER — Other Ambulatory Visit: Payer: Self-pay

## 2021-03-29 ENCOUNTER — Ambulatory Visit (INDEPENDENT_AMBULATORY_CARE_PROVIDER_SITE_OTHER): Payer: Medicare Other | Admitting: Urology

## 2021-03-29 DIAGNOSIS — N319 Neuromuscular dysfunction of bladder, unspecified: Secondary | ICD-10-CM

## 2021-04-01 ENCOUNTER — Telehealth: Payer: Self-pay | Admitting: Urology

## 2021-04-01 MED ORDER — TROSPIUM CHLORIDE 20 MG PO TABS: 20.0000 mg | ORAL_TABLET | Freq: Two times a day (BID) | ORAL | 0 refills | Status: DC

## 2021-04-01 NOTE — Telephone Encounter (Signed)
Victoria Medina from [facility] [phone]) called the office this afternoon.  Patient is complaining of bladder spasms.  She is requesting a prescription.  I advised her that it will be Tuesday 9/6 before we can get back to her.  She expressed understanding.

## 2021-04-01 NOTE — Telephone Encounter (Signed)
Per Victoria Medina was sent to [facility] for patient to try for bladder spasms.

## 2021-04-07 ENCOUNTER — Ambulatory Visit (INDEPENDENT_AMBULATORY_CARE_PROVIDER_SITE_OTHER): Payer: Medicare Other | Admitting: Dermatology

## 2021-04-07 ENCOUNTER — Other Ambulatory Visit: Payer: Self-pay

## 2021-04-07 DIAGNOSIS — S01312A Laceration without foreign body of left ear, initial encounter: Secondary | ICD-10-CM | POA: Diagnosis not present

## 2021-04-07 NOTE — Patient Instructions (Signed)

## 2021-04-07 NOTE — Progress Notes (Signed)
   New Patient Visit  Subjective  Victoria Medina is a 60 y.o. female who presents for the following: Skin Problem (Pt states that her earring ripped out of her ear on Saturday 04/02/21. Pt interested in getting this repaired. ).  Objective  Well appearing patient in no apparent distress; mood and affect are within normal limits.  A focused examination was performed including left ear. Relevant physical exam findings are noted in the Assessment and Plan.  Assessment & Plan  Laceration of left earlobe, initial encounter Left Earlobe  Will plan to repair in an in office surgery. Pt will schedule.   Do not recommend repiercing for at least 6 weeks. Cannot pierce in the same spot.  Discuss slight change and shape of earlobe that will occur with earlobe repair.  Patient understands.  She is on blood thinners but has no contraindication to surgery at this time. Return for first available surgery slot for earlobe repair.  IEpifania Gore, CMA, am acting as scribe for Armida Sans, MD. Documentation: I have reviewed the above documentation for accuracy and completeness, and I agree with the above.  Armida Sans, MD

## 2021-04-12 ENCOUNTER — Encounter: Payer: Self-pay | Admitting: Dermatology

## 2021-04-22 ENCOUNTER — Other Ambulatory Visit: Payer: Self-pay

## 2021-04-22 ENCOUNTER — Emergency Department: Payer: Medicare Other

## 2021-04-22 ENCOUNTER — Inpatient Hospital Stay
Admission: EM | Admit: 2021-04-22 | Discharge: 2021-04-25 | DRG: 444 | Discharge status: Against Medical Advice | Payer: Medicare Other | Attending: Internal Medicine | Admitting: Internal Medicine

## 2021-04-22 DIAGNOSIS — Z79890 Hormone replacement therapy (postmenopausal): Secondary | ICD-10-CM

## 2021-04-22 DIAGNOSIS — R1011 Right upper quadrant pain: Secondary | ICD-10-CM | POA: Diagnosis not present

## 2021-04-22 DIAGNOSIS — K819 Cholecystitis, unspecified: Secondary | ICD-10-CM | POA: Diagnosis not present

## 2021-04-22 DIAGNOSIS — D509 Iron deficiency anemia, unspecified: Secondary | ICD-10-CM | POA: Diagnosis present

## 2021-04-22 DIAGNOSIS — Z8249 Family history of ischemic heart disease and other diseases of the circulatory system: Secondary | ICD-10-CM

## 2021-04-22 DIAGNOSIS — Z5321 Procedure and treatment not carried out due to patient leaving prior to being seen by health care provider: Secondary | ICD-10-CM | POA: Diagnosis present

## 2021-04-22 DIAGNOSIS — Z955 Presence of coronary angioplasty implant and graft: Secondary | ICD-10-CM

## 2021-04-22 DIAGNOSIS — F411 Generalized anxiety disorder: Secondary | ICD-10-CM | POA: Diagnosis present

## 2021-04-22 DIAGNOSIS — Z87891 Personal history of nicotine dependence: Secondary | ICD-10-CM

## 2021-04-22 DIAGNOSIS — Z91041 Radiographic dye allergy status: Secondary | ICD-10-CM

## 2021-04-22 DIAGNOSIS — I252 Old myocardial infarction: Secondary | ICD-10-CM

## 2021-04-22 DIAGNOSIS — E66812 Obesity, class 2: Secondary | ICD-10-CM | POA: Diagnosis present

## 2021-04-22 DIAGNOSIS — Z66 Do not resuscitate: Secondary | ICD-10-CM | POA: Diagnosis present

## 2021-04-22 DIAGNOSIS — G4733 Obstructive sleep apnea (adult) (pediatric): Secondary | ICD-10-CM | POA: Diagnosis present

## 2021-04-22 DIAGNOSIS — N319 Neuromuscular dysfunction of bladder, unspecified: Secondary | ICD-10-CM | POA: Diagnosis present

## 2021-04-22 DIAGNOSIS — I25118 Atherosclerotic heart disease of native coronary artery with other forms of angina pectoris: Secondary | ICD-10-CM | POA: Diagnosis present

## 2021-04-22 DIAGNOSIS — Z79899 Other long term (current) drug therapy: Secondary | ICD-10-CM

## 2021-04-22 DIAGNOSIS — I214 Non-ST elevation (NSTEMI) myocardial infarction: Secondary | ICD-10-CM | POA: Diagnosis present

## 2021-04-22 DIAGNOSIS — Z7982 Long term (current) use of aspirin: Secondary | ICD-10-CM

## 2021-04-22 DIAGNOSIS — D72829 Elevated white blood cell count, unspecified: Secondary | ICD-10-CM | POA: Diagnosis present

## 2021-04-22 DIAGNOSIS — I10 Essential (primary) hypertension: Secondary | ICD-10-CM | POA: Diagnosis present

## 2021-04-22 DIAGNOSIS — Z833 Family history of diabetes mellitus: Secondary | ICD-10-CM

## 2021-04-22 DIAGNOSIS — K8020 Calculus of gallbladder without cholecystitis without obstruction: Secondary | ICD-10-CM | POA: Diagnosis not present

## 2021-04-22 DIAGNOSIS — Z6833 Body mass index (BMI) 33.0-33.9, adult: Secondary | ICD-10-CM

## 2021-04-22 DIAGNOSIS — Z9119 Patient's noncompliance with other medical treatment and regimen: Secondary | ICD-10-CM

## 2021-04-22 DIAGNOSIS — E782 Mixed hyperlipidemia: Secondary | ICD-10-CM | POA: Diagnosis present

## 2021-04-22 DIAGNOSIS — K8010 Calculus of gallbladder with chronic cholecystitis without obstruction: Secondary | ICD-10-CM | POA: Diagnosis not present

## 2021-04-22 DIAGNOSIS — G4700 Insomnia, unspecified: Secondary | ICD-10-CM | POA: Diagnosis present

## 2021-04-22 DIAGNOSIS — E669 Obesity, unspecified: Secondary | ICD-10-CM | POA: Diagnosis present

## 2021-04-22 DIAGNOSIS — F419 Anxiety disorder, unspecified: Secondary | ICD-10-CM | POA: Diagnosis present

## 2021-04-22 DIAGNOSIS — Z888 Allergy status to other drugs, medicaments and biological substances status: Secondary | ICD-10-CM

## 2021-04-22 DIAGNOSIS — Z91040 Latex allergy status: Secondary | ICD-10-CM

## 2021-04-22 DIAGNOSIS — Z20822 Contact with and (suspected) exposure to covid-19: Secondary | ICD-10-CM | POA: Diagnosis present

## 2021-04-22 DIAGNOSIS — I69354 Hemiplegia and hemiparesis following cerebral infarction affecting left non-dominant side: Secondary | ICD-10-CM

## 2021-04-22 DIAGNOSIS — E119 Type 2 diabetes mellitus without complications: Secondary | ICD-10-CM

## 2021-04-22 DIAGNOSIS — H548 Legal blindness, as defined in USA: Secondary | ICD-10-CM | POA: Diagnosis present

## 2021-04-22 DIAGNOSIS — K219 Gastro-esophageal reflux disease without esophagitis: Secondary | ICD-10-CM | POA: Diagnosis present

## 2021-04-22 DIAGNOSIS — D75839 Thrombocytosis, unspecified: Secondary | ICD-10-CM | POA: Diagnosis present

## 2021-04-22 DIAGNOSIS — H47619 Cortical blindness, unspecified side of brain: Secondary | ICD-10-CM | POA: Diagnosis present

## 2021-04-22 DIAGNOSIS — R109 Unspecified abdominal pain: Secondary | ICD-10-CM

## 2021-04-22 DIAGNOSIS — Z83438 Family history of other disorder of lipoprotein metabolism and other lipidemia: Secondary | ICD-10-CM

## 2021-04-22 DIAGNOSIS — Z823 Family history of stroke: Secondary | ICD-10-CM

## 2021-04-22 DIAGNOSIS — E039 Hypothyroidism, unspecified: Secondary | ICD-10-CM | POA: Diagnosis present

## 2021-04-22 HISTORY — DX: Cholecystitis, unspecified: K81.9

## 2021-04-22 LAB — CBC
HCT: 27.3 % — ABNORMAL LOW (ref 36.0–46.0)
Hemoglobin: 8.2 g/dL — ABNORMAL LOW (ref 12.0–15.0)
MCH: 19.3 pg — ABNORMAL LOW (ref 26.0–34.0)
MCHC: 30 g/dL (ref 30.0–36.0)
MCV: 64.4 fL — ABNORMAL LOW (ref 80.0–100.0)
Platelets: 566 10*3/uL — ABNORMAL HIGH (ref 150–400)
RBC: 4.24 MIL/uL (ref 3.87–5.11)
RDW: 17.8 % — ABNORMAL HIGH (ref 11.5–15.5)
WBC: 16.8 10*3/uL — ABNORMAL HIGH (ref 4.0–10.5)
nRBC: 0 % (ref 0.0–0.2)

## 2021-04-22 LAB — BASIC METABOLIC PANEL
Anion gap: 7 (ref 5–15)
BUN: 9 mg/dL (ref 6–20)
CO2: 29 mmol/L (ref 22–32)
Calcium: 8.4 mg/dL — ABNORMAL LOW (ref 8.9–10.3)
Chloride: 94 mmol/L — ABNORMAL LOW (ref 98–111)
Creatinine, Ser: 0.8 mg/dL (ref 0.44–1.00)
GFR, Estimated: >60 mL/min (ref >60)
Glucose, Bld: 115 mg/dL — ABNORMAL HIGH (ref 70–99)
Potassium: 3.8 mmol/L (ref 3.5–5.1)
Sodium: 130 mmol/L — ABNORMAL LOW (ref 135–145)

## 2021-04-22 LAB — URINALYSIS, ROUTINE W REFLEX MICROSCOPIC
Bilirubin Urine: NEGATIVE
Glucose, UA: NEGATIVE mg/dL
Ketones, ur: NEGATIVE mg/dL
Nitrite: POSITIVE — AB
Protein, ur: NEGATIVE mg/dL
Specific Gravity, Urine: 1.006 (ref 1.005–1.030)
Squamous Epithelial / HPF: NONE SEEN (ref 0–5)
pH: 7 (ref 5.0–8.0)

## 2021-04-22 LAB — RESP PANEL BY RT-PCR (FLU A&B, COVID) ARPGX2
Influenza A by PCR: NEGATIVE
Influenza B by PCR: NEGATIVE
SARS Coronavirus 2 by RT PCR: NEGATIVE

## 2021-04-22 LAB — TROPONIN I (HIGH SENSITIVITY): Troponin I (High Sensitivity): 4 ng/L (ref <18)

## 2021-04-22 LAB — HEPATIC FUNCTION PANEL
ALT: 9 U/L (ref 0–44)
AST: 10 U/L — ABNORMAL LOW (ref 15–41)
Albumin: 3.8 g/dL (ref 3.5–5.0)
Alkaline Phosphatase: 84 U/L (ref 38–126)
Bilirubin, Direct: <0.1 mg/dL (ref 0.0–0.2)
Total Bilirubin: 0.6 mg/dL (ref 0.3–1.2)
Total Protein: 7.5 g/dL (ref 6.5–8.1)

## 2021-04-22 LAB — D-DIMER, QUANTITATIVE: D-Dimer, Quant: 1.21 ug/mL-FEU — ABNORMAL HIGH (ref 0.00–0.50)

## 2021-04-22 LAB — LIPASE, BLOOD: Lipase: 34 U/L (ref 11–51)

## 2021-04-22 MED ORDER — DIAZEPAM 5 MG PO TABS
5.0000 mg | ORAL_TABLET | Freq: Three times a day (TID) | ORAL | Status: DC | PRN
  Administered 2021-04-22: 5 mg via ORAL
  Filled 2021-04-22: qty 1

## 2021-04-22 MED ORDER — DIPHENHYDRAMINE HCL 50 MG/ML IJ SOLN: 50.0000 mg | Freq: Once | INTRAMUSCULAR | Status: DC

## 2021-04-22 MED ORDER — ACETAMINOPHEN 325 MG PO TABS: 650.0000 mg | ORAL_TABLET | Freq: Four times a day (QID) | ORAL | Status: DC | PRN

## 2021-04-22 MED ORDER — ONDANSETRON HCL 4 MG/2ML IJ SOLN
4.0000 mg | Freq: Four times a day (QID) | INTRAMUSCULAR | Status: DC | PRN
Start: 2021-04-22 — End: 2021-04-25

## 2021-04-22 MED ORDER — PIPERACILLIN-TAZOBACTAM 3.375 G IVPB 30 MIN
3.3750 g | Freq: Once | INTRAVENOUS | Status: AC
  Administered 2021-04-22: 3.375 g via INTRAVENOUS
  Filled 2021-04-22: qty 50

## 2021-04-22 MED ORDER — ONDANSETRON HCL 4 MG PO TABS
4.0000 mg | ORAL_TABLET | Freq: Four times a day (QID) | ORAL | Status: DC | PRN
Start: 2021-04-22 — End: 2021-04-25

## 2021-04-22 MED ORDER — DIPHENHYDRAMINE HCL 25 MG PO CAPS: 50.0000 mg | ORAL_CAPSULE | Freq: Once | ORAL | Status: DC

## 2021-04-22 MED ORDER — HYDROCORTISONE SOD SUC (PF) 250 MG IJ SOLR
200.0000 mg | Freq: Once | INTRAMUSCULAR | Status: DC
  Filled 2021-04-22: qty 200

## 2021-04-22 MED ORDER — PANTOPRAZOLE SODIUM 20 MG PO TBEC
20.0000 mg | DELAYED_RELEASE_TABLET | Freq: Every day | ORAL | Status: DC
  Administered 2021-04-23 – 2021-04-24 (×2): 20 mg via ORAL
  Filled 2021-04-22 (×3): qty 1

## 2021-04-22 MED ORDER — NITROGLYCERIN 0.4 MG SL SUBL: 0.4000 mg | SUBLINGUAL_TABLET | SUBLINGUAL | Status: DC | PRN

## 2021-04-22 MED ORDER — ASPIRIN EC 81 MG PO TBEC: 81.0000 mg | DELAYED_RELEASE_TABLET | Freq: Every day | ORAL | Status: DC

## 2021-04-22 MED ORDER — HEPARIN SODIUM (PORCINE) 5000 UNIT/ML IJ SOLN
5000.0000 [IU] | Freq: Three times a day (TID) | INTRAMUSCULAR | Status: DC
  Administered 2021-04-22: 5000 [IU] via SUBCUTANEOUS

## 2021-04-22 MED ORDER — CLONAZEPAM 0.5 MG PO TABS
0.5000 mg | ORAL_TABLET | Freq: Every day | ORAL | Status: DC
  Administered 2021-04-22 – 2021-04-24 (×3): 0.5 mg via ORAL
  Filled 2021-04-22 (×3): qty 1

## 2021-04-22 MED ORDER — SODIUM CHLORIDE 0.9 % IV BOLUS
500.0000 mL | Freq: Once | INTRAVENOUS | Status: AC
  Administered 2021-04-22: 500 mL via INTRAVENOUS

## 2021-04-22 MED ORDER — ACETAMINOPHEN 325 MG RE SUPP: 650.0000 mg | Freq: Four times a day (QID) | RECTAL | Status: DC | PRN

## 2021-04-22 MED ORDER — TIZANIDINE HCL 2 MG PO TABS
2.0000 mg | ORAL_TABLET | Freq: Three times a day (TID) | ORAL | Status: DC | PRN
  Filled 2021-04-22 (×2): qty 1

## 2021-04-22 MED ORDER — MORPHINE SULFATE (PF) 4 MG/ML IV SOLN
4.0000 mg | INTRAVENOUS | Status: DC | PRN
  Administered 2021-04-22: 4 mg via INTRAVENOUS
  Filled 2021-04-22: qty 1

## 2021-04-22 MED ORDER — LEVOTHYROXINE SODIUM 50 MCG PO TABS
25.0000 ug | ORAL_TABLET | Freq: Every day | ORAL | Status: DC
  Administered 2021-04-25: 25 ug via ORAL
  Filled 2021-04-22: qty 1

## 2021-04-22 MED ORDER — LIDOCAINE 5 % EX PTCH
1.0000 | MEDICATED_PATCH | CUTANEOUS | Status: DC
  Administered 2021-04-22 – 2021-04-23 (×2): 1 via TRANSDERMAL
  Filled 2021-04-22 (×4): qty 1

## 2021-04-22 MED ORDER — PIPERACILLIN-TAZOBACTAM 3.375 G IVPB
3.3750 g | Freq: Three times a day (TID) | INTRAVENOUS | Status: DC
  Administered 2021-04-22 – 2021-04-23 (×3): 3.375 g via INTRAVENOUS
  Filled 2021-04-22 (×5): qty 50

## 2021-04-22 MED ORDER — DIAZEPAM 5 MG PO TABS
10.0000 mg | ORAL_TABLET | Freq: Three times a day (TID) | ORAL | Status: DC | PRN
  Administered 2021-04-22: 10 mg via ORAL
  Filled 2021-04-22: qty 2

## 2021-04-22 MED ORDER — IBUPROFEN 400 MG PO TABS
800.0000 mg | ORAL_TABLET | Freq: Four times a day (QID) | ORAL | Status: AC | PRN
  Filled 2021-04-22: qty 2

## 2021-04-22 MED ORDER — MIDAZOLAM HCL 5 MG/5ML IJ SOLN
2.0000 mg | Freq: Once | INTRAMUSCULAR | Status: DC
  Filled 2021-04-22: qty 5

## 2021-04-22 NOTE — Consult Note (Signed)
Pharmacy Antibiotic Note  Victoria Medina is a 60 y.o. female admitted on 04/22/2021 with  intra-abdominal infection .    Pharmacy has been consulted for Zosyn dosing.  Plan: Zosyn 3.375g IV q8h (4 hour infusion).  Height: 5\' 1"  (154.9 cm) Weight: 79.4 kg (175 lb) IBW/kg (Calculated) : 47.8  Temp (24hrs), Avg:98.7 F (37.1 C), Min:98.5 F (36.9 C), Max:98.8 F (37.1 C)  Recent Labs  Lab 04/22/21 1334  WBC 16.8*  CREATININE 0.80    Estimated Creatinine Clearance: 72.2 mL/min (by C-G formula based on SCr of 0.8 mg/dL).    Allergies  Allergen Reactions   Levaquin [Levofloxacin In D5w] Anaphylaxis and Swelling   Iodinated Diagnostic Agents Itching    Severe itching   Latex Dermatitis    Antimicrobials this admission: 9/23 Zosyn >>  Dose adjustments this admission: None  Microbiology results: 9/23 Bcx pending  Thank you for allowing pharmacy to be a part of this patient's care.  10/23, PharmD, BCPS Clinical Pharmacist 04/22/2021 5:45 PM

## 2021-04-22 NOTE — H&P (Signed)
History and Physical   Victoria Medina BJS:[id number] DOB: 08-27-1960 DOA: 04/22/2021  PCP: Karie Schwalbe, MD  Patient coming from: Facility  I have personally briefly reviewed patient's old medical records in [facility] EMR.  Chief Concern: Abdominal pain  HPI: Victoria Medina is a 60 y.o. female with medical history significant for history of stroke, multiple heart attacks, with cortical blindness, residual left-sided hemiplegia, CAD status post extensive stenting in 2019, history of STEMI in 2021, who presents for abdominal pain.  [name] is able to tell me her name, age, current location, and current calendar year.   She reports the right upper quadrant abdominal pain, like someone punched her, started a few weeks ago, persistent, not consistent with PO intake or anorexia. The pain would radiate to her right shoulder and right mid back. 10/10. Nothing makes it better. She ednroses associated poor sleep.   She denies fever, chills, chest pain, diarrhea.  She endorses chronic suprapubic pain that has been chronic since having an indwelling catheter  At bedside, patient states that she is in so much pain and that this is no way to live.  She states that she would like to be DNR/DNI at this time.  She would just like her surgery so that her pain will go away.  She understands the risk of OR for the gallbladder removal and that this could result in her death.  She states that she would rather die on the OR table then to continue living with this pain.  Social history: She lives at [facility]. She endorses former tobacco use. She denies current etoh. Formerly she drank, and she occassiojaly drank 1/5 of alcohol. She has not had a drink in a long time. 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 She studied traumatic brain injury.   Vaccination history: 2 doses of Moderna, two doses and one booster.   ROS: Constitutional: no weight change, no  fever ENT/Mouth: no sore throat, no rhinorrhea Eyes: no eye pain, no vision changes Cardiovascular: no chest pain, no dyspnea,  no edema, no palpitations Respiratory: no cough, no sputum, no wheezing Gastrointestinal: no nausea, no vomiting, no diarrhea, no constipation Genitourinary: no urinary incontinence, no dysuria, no hematuria Musculoskeletal: no arthralgias, no myalgias Skin: no skin lesions, no pruritus, Neuro: + weakness, no loss of consciousness, no syncope Psych: no anxiety, no depression, + decrease appetite Heme/Lymph: no bruising, no bleeding  ED Course: Discussed with emergency medicine provider, patient requiring hospitalization for chief concerns of cholecystitis.  Assessment/Plan  Principal Problem:   Cholecystitis Active Problems:   NSTEMI (non-ST elevated myocardial infarction) (HCC)   Diabetes mellitus without complication (HCC)   Hypertension   Hypothyroidism (acquired)   Anxiety   Generalized anxiety disorder   Mixed hyperlipidemia   Obesity   Leukocytosis   # Right upper quadrant abdominal pain with leukocytosis concerning for cholecystitis # leukocytosis - General surgery has been consulted - Continue Zosyn - General surgery placed a epic consult for cardiology - A.m. team to consult cardiology as appropriate - N.p.o. at at this time - Pain control: Morphine 4 mg IV every 2 hours as needed for severe pain, 1 day ordered, Dilaudid 1 mg IV every 4 hours as needed for refractory pain, 2 doses ordered  # Neurogenic bladder status post chronic Foley catheter-Foley catheter present on admission  # Extensive history of CAD and stroke -aspirin 81 mg and brilinta has been held at this time pending general  surgery for possible intervention -Nitroglycerin 0.4 mg sublingual every 5 minutes as needed for chest pain  # Anxiety-resumed home dose of Valium 10 mg p.o. every 8 hours as needed for anxiety, 2 doses ordered -Patient states that she is on Valium,  however with med reconciliation this was not part of her med medication -A.m. team to complete med reconciliation regarding Valium -Clonazepam 0.5 mg p.o. nightly  # Hypothyroid-levothyroxine 25 mcg before breakfast  # GERD-PPI  # DVT prophylaxis-a.m. team to resume pharmacologic DVT prophylaxis when appropriate  Chart reviewed.   DVT prophylaxis: Heparin 5000 units, total of 2 doses ordered Code Status: DNR/DNI Diet: NPO Family Communication: No Disposition Plan: pending clinical course Consults called: General surgery Admission status: MedSurg, observation, telemetry  Past Medical History:  Diagnosis Date   Blind    Diabetes mellitus without complication (HCC)    Hyperlipemia    Hypertension    Stroke [facility])    Past Surgical History:  Procedure Laterality Date   ABDOMINAL HYSTERECTOMY     BACK SURGERY     CARDIAC CATHETERIZATION     CORONARY/GRAFT ACUTE MI REVASCULARIZATION N/A 01/25/2020   Procedure: Coronary/Graft Acute MI Revascularization;  Surgeon: Iran Ouch, MD;  Location: [facility] INVASIVE CV LAB;  Service: Cardiovascular;  Laterality: N/A;   FRACTURE SURGERY     LEFT HEART CATH AND CORONARY ANGIOGRAPHY N/A 01/25/2020   Procedure: LEFT HEART CATH AND CORONARY ANGIOGRAPHY;  Surgeon: Iran Ouch, MD;  Location: [facility] INVASIVE CV LAB;  Service: Cardiovascular;  Laterality: N/A;   SKIN GRAFT Right    Social History:  reports that she has never smoked. She has never used smokeless tobacco. She reports that she does not drink alcohol and does not use drugs.  Allergies  Allergen Reactions   Levaquin [Levofloxacin In D5w] Anaphylaxis and Swelling   Iodinated Diagnostic Agents Itching    Severe itching   Latex Dermatitis   Family History  Problem Relation Age of Onset   Heart attack Mother    Hypertension Mother    Hypercholesterolemia Mother    Diabetes Mother    Stroke Father    Heart attack Father    Hypertension Father    Hypercholesterolemia Father     Diabetes Father    Diabetes Maternal Grandmother    Cancer - Other Maternal Grandmother    Family history: Family history reviewed and not pertinent  Prior to Admission medications   Medication Sig Start Date End Date Taking? Authorizing Provider  aspirin EC 81 MG tablet Take 81 mg by mouth daily. Swallow whole.   Yes [provider]  Azelastine HCl 0.15 % SOLN Place 2 sprays into both nostrils 2 (two) times daily. 01/14/20  Yes [provider]  bisacodyl (DULCOLAX) 10 MG suppository Place 10 mg rectally as needed for moderate constipation.   Yes [provider]  clonazePAM (KLONOPIN) 0.5 MG tablet Take 0.5 mg by mouth at bedtime.   Yes [provider]  ibuprofen (ADVIL) 800 MG tablet Take 1 tablet by mouth every 8 (eight) hours as needed.   Yes [provider]  lansoprazole (PREVACID) 15 MG capsule Take 15 mg by mouth daily at 12 noon.    Yes [provider]  levothyroxine (SYNTHROID) 25 MCG tablet Take 25 mcg by mouth daily before breakfast.   Yes [provider]  magnesium citrate SOLN Take 5 mLs by mouth as needed for severe constipation.   Yes [provider]  nitroGLYCERIN (NITROSTAT) 0.4 MG SL tablet  Place 0.4 mg under the tongue every 5 (five) minutes x 3 doses as needed for chest pain.    Yes [provider]  nystatin-triamcinolone ointment (MYCOLOG) Apply 1 application topically 2 (two) times daily. 01/21/21  Yes McGowan, Carollee Herter A, PA-C  ticagrelor (BRILINTA) 60 MG TABS tablet Take 1 tablet by mouth 2 (two) times daily.   Yes [provider]  tiZANidine (ZANAFLEX) 2 MG tablet Take 2 mg by mouth 3 (three) times daily as needed for muscle spasms.   Yes [provider]  Vitamin D, Ergocalciferol, (DRISDOL) 50000 units CAPS capsule Take 50,000 Units by mouth every Monday.   Yes [provider]  conjugated estrogens (PREMARIN) vaginal cream Apply 0.5mg  (pea-sized amount)  just  inside the vaginal introitus with a finger-tip on  Monday, Wednesday and Friday nights. Patient not taking: No sig reported 07/21/20   Michiel Cowboy A, PA-C  losartan (COZAAR) 25 MG tablet Take 1 tablet (25 mg total) by mouth daily. Patient not taking: No sig reported 01/28/20   Lurene Shadow, MD  metFORMIN (GLUCOPHAGE) 500 MG tablet Take 500 mg by mouth 2 (two) times daily. Patient not taking: Reported on 04/22/2021    [provider]  mirabegron ER (MYRBETRIQ) 25 MG TB24 tablet Take 1 tablet (25 mg total) by mouth daily. Patient not taking: No sig reported 05/17/20   Carman Ching, PA-C  sennosides-docusate sodium (SENOKOT-S) 8.6-50 MG tablet Take 1 tablet by mouth in the morning and at bedtime. Patient not taking: No sig reported    [provider]  trospium (SANCTURA) 20 MG tablet Take 1 tablet (20 mg total) by mouth 2 (two) times daily. Patient not taking: No sig reported 04/01/21   Michiel Cowboy A, PA-C  vitamin B-12 (CYANOCOBALAMIN) 1000 MCG tablet Take 1,000 mcg by mouth daily. Patient not taking: Reported on 04/22/2021    [provider]   Physical Exam: Vitals:   04/22/21 1313 04/22/21 1315 04/22/21 1715  BP: 136/84  140/70  Pulse: 80  81  Resp: 17  17  Temp: 98.5 F (36.9 C)  98.8 F (37.1 C)  TempSrc: Oral  Oral  SpO2: 98%  98%  Weight:  79.4 kg   Height:  5\' 1"  (1.549 m)    Constitutional: appears older than chronological age, NAD, calm, comfortable Eyes: PERRL, lids and conjunctivae normal ENMT: Mucous membranes are moist. Posterior pharynx clear of any exudate or lesions. Age-appropriate dentition. Hearing appropriate Neck: normal, supple, no masses, no thyromegaly Respiratory: clear to auscultation bilaterally, no wheezing, no crackles. Normal respiratory effort. No accessory muscle use.  Cardiovascular: Regular rate and rhythm, no murmurs / rubs / gallops. No extremity edema. 2+ pedal pulses. No carotid bruits.  Abdomen: Obese  abdomen, no tenderness, no masses palpated, no hepatosplenomegaly. Bowel sounds positive.  Musculoskeletal: no clubbing / cyanosis. No joint deformity upper and lower extremities. Good ROM, + contractures, no atrophy. Normal muscle tone.  GU: Indwelling Foley catheter present Skin: no rashes, lesions, ulcers. No induration Neurologic: Sensation intact. Strength 5/5 in all 4.  Psychiatric: Normal judgment and insight. Alert and oriented x 3. Normal mood.   EKG: independently reviewed, showing sinus rhythm with rate of 79, QTc 426  Chest x-ray on Admission: I personally reviewed and I agree with radiologist reading as below.  DG Chest 2 View  Result Date: 04/22/2021 CLINICAL DATA:  Chest pain. EXAM: CHEST - 2 VIEW COMPARISON:  January 24, 2020. FINDINGS: The heart size and mediastinal contours are within normal limits.  Both lungs are clear. The visualized skeletal structures are unremarkable. IMPRESSION: No active cardiopulmonary disease. Electronically Signed   By: Lupita Raider M.D.   On: 04/22/2021 14:38   US ABDOMEN LIMITED RUQ (LIVER/GB)  Result Date: 04/22/2021 CLINICAL DATA:  Abdominal pain for 1 month EXAM: ULTRASOUND ABDOMEN LIMITED RIGHT UPPER QUADRANT COMPARISON:  CT chest 01/24/2020. FINDINGS: Gallbladder: 9 mm echogenic, shadowing stone within the gallbladder. Small amount of layering sludge. Mild diffuse gallbladder wall thickening of up to 3.7 mm. Technologist did not indicate whether a sonographic Murphy's sign was present. Common bile duct: Diameter: 4 mm. Liver: No focal lesion identified. Within normal limits in parenchymal echogenicity. Portal vein is patent on color Doppler imaging with normal direction of blood flow towards the liver. Other: Trace perihepatic free fluid. IMPRESSION: 1. Cholelithiasis with mild gallbladder wall thickening, which may reflect cholecystitis. 2. Trace perihepatic free fluid. Electronically Signed   By: Duanne Guess D.O.   On: 04/22/2021 15:22     Labs on Admission: I have personally reviewed following labs  CBC: Recent Labs  Lab 04/22/21 1334  WBC 16.8*  HGB 8.2*  HCT 27.3*  MCV 64.4*  PLT 566*   Basic Metabolic Panel: Recent Labs  Lab 04/22/21 1334  NA 130*  K 3.8  CL 94*  CO2 29  GLUCOSE 115*  BUN 9  CREATININE 0.80  CALCIUM 8.4*   GFR: Estimated Creatinine Clearance: 72.2 mL/min (by C-G formula based on SCr of 0.8 mg/dL).  Liver Function Tests: Recent Labs  Lab 04/22/21 1334  AST 10*  ALT 9  ALKPHOS 84  BILITOT 0.6  PROT 7.5  ALBUMIN 3.8   Recent Labs  Lab 04/22/21 1334  LIPASE 34   Urine analysis:    Component Value Date/Time   COLORURINE YELLOW (A) 05/28/2016 1545   APPEARANCEUR Cloudy (A) 05/17/2020 1136   LABSPEC 1.006 05/28/2016 1545   LABSPEC 1.026 08/05/2014 1906   PHURINE 7.0 05/28/2016 1545   GLUCOSEU Negative 05/17/2020 1136   GLUCOSEU NEGATIVE 08/05/2014 1906   HGBUR NEGATIVE 05/28/2016 1545   BILIRUBINUR Negative 05/17/2020 1136   BILIRUBINUR NEGATIVE 08/05/2014 1906   KETONESUR NEGATIVE 05/28/2016 1545   PROTEINUR Trace (A) 05/17/2020 1136   PROTEINUR NEGATIVE 05/28/2016 1545   NITRITE Negative 05/17/2020 1136   NITRITE NEGATIVE 05/28/2016 1545   LEUKOCYTESUR 1+ (A) 05/17/2020 1136   LEUKOCYTESUR 2+ 08/05/2014 1906   Dr. Sedalia Muta [facility]  If 7PM-7AM, please contact overnight-coverage provider If 7AM-7PM, please contact day coverage provider [email or web address]  04/22/2021, 5:33 PM

## 2021-04-22 NOTE — ED Notes (Signed)
Ed md placed iv left arm , iv unable to flush

## 2021-04-22 NOTE — ED Triage Notes (Signed)
Pt comes into the ED via [facility] from [facility] c/o chest pain that is worse with breathing as well as with palpation.  Per the patient she has had multiple cardiac stents and CVA's.  EKG was NSR at 82.  Pt is blind in both eyes.   134/82

## 2021-04-22 NOTE — ED Provider Notes (Signed)
[facility] Emergency Department Provider Note  ____________________________________________   Event Date/Time   First MD Initiated Contact with Patient 04/22/21 1338     (approximate)  I have reviewed the triage vital signs and the nursing notes.   HISTORY  Chief Complaint Chest Pain    HPI Victoria Medina is a 60 y.o. female hypertension diabetes stroke chronic left-sided hemiplegia and cortical blindness, coronary artery disease status post stents who comes in with concern for chest pain.  Patient comes in from [facility] for chest pain that is worse with breathing as well as with palpation.  The pain is underneath her right breast, constant, nothing makes it better, pain with breathing and with pushing on it.          Past Medical History:  Diagnosis Date   Blind    Diabetes mellitus without complication (HCC)    Hyperlipemia    Hypertension    Stroke [facility])     Patient Active Problem List   Diagnosis Date Noted   Acute ST elevation myocardial infarction (STEMI) of inferolateral wall (HCC) 01/25/2020   NSTEMI (non-ST elevated myocardial infarction) (HCC) 01/24/2020   Stroke (HCC)    Diabetes mellitus without complication (HCC)    Hypertension    Blind    Hypothyroidism (acquired)    Anxiety    GERD (gastroesophageal reflux disease) 12/18/2014   Diabetes mellitus type 2 in obese (HCC) 01/22/2014   Left knee pain 08/22/2013   Impingement syndrome of right shoulder 07/07/2012   Right shoulder pain 07/07/2012   Obesity 06/04/2012   Palpitations 06/04/2012   DVT (deep venous thrombosis) (HCC) 04/03/2012   PE (pulmonary thromboembolism) (HCC) 04/02/2012   Cervical intraepithelial glandular neoplasia 02/07/2011   Generalized anxiety disorder 02/07/2011   Insomnia 02/07/2011   Intractable chronic migraine without aura 02/07/2011   Mixed hyperlipidemia 02/07/2011   Myalgia and myositis, unspecified 02/07/2011   Obstructive sleep apnea  (adult) (pediatric) 02/07/2011   Syncope and collapse 02/07/2011    Past Surgical History:  Procedure Laterality Date   ABDOMINAL HYSTERECTOMY     BACK SURGERY     CARDIAC CATHETERIZATION     CORONARY/GRAFT ACUTE MI REVASCULARIZATION N/A 01/25/2020   Procedure: Coronary/Graft Acute MI Revascularization;  Surgeon: Iran Ouch, MD;  Location: [facility] INVASIVE CV LAB;  Service: Cardiovascular;  Laterality: N/A;   FRACTURE SURGERY     LEFT HEART CATH AND CORONARY ANGIOGRAPHY N/A 01/25/2020   Procedure: LEFT HEART CATH AND CORONARY ANGIOGRAPHY;  Surgeon: Iran Ouch, MD;  Location: [facility] INVASIVE CV LAB;  Service: Cardiovascular;  Laterality: N/A;   SKIN GRAFT Right     Prior to Admission medications   Medication Sig Start Date End Date Taking? Authorizing Provider  aspirin EC 81 MG tablet Take 81 mg by mouth daily. Swallow whole.    [provider]  Azelastine HCl 0.15 % SOLN Place 2 sprays into both nostrils 2 (two) times daily. 01/14/20   [provider]  bisacodyl (DULCOLAX) 10 MG suppository Place 10 mg rectally as needed for moderate constipation.    [provider]  clonazePAM (KLONOPIN) 0.5 MG tablet Take 0.5 mg by mouth at bedtime.    [provider]  conjugated estrogens (PREMARIN) vaginal cream Apply 0.5mg  (pea-sized amount)  just inside the vaginal introitus with a finger-tip on  Monday, Wednesday and Friday nights. 07/21/20   Michiel Cowboy A, PA-C  diazepam (VALIUM) 10 MG tablet Take 10 mg by mouth every 4 (four) hours.  [provider]  lansoprazole (PREVACID) 15 MG capsule Take 15 mg by mouth daily at 12 noon.     [provider]  levothyroxine (SYNTHROID) 25 MCG tablet Take 25 mcg by mouth daily before breakfast.    [provider]  losartan (COZAAR) 25 MG tablet Take 1 tablet (25 mg total) by mouth daily. 01/28/20   Lurene Shadow, MD  magnesium citrate SOLN Take 5 mLs by mouth as needed for severe  constipation.    [provider]  metFORMIN (GLUCOPHAGE) 500 MG tablet Take 500 mg by mouth 2 (two) times daily.    [provider]  mirabegron ER (MYRBETRIQ) 25 MG TB24 tablet Take 1 tablet (25 mg total) by mouth daily. 05/17/20   Vaillancourt, Lelon Mast, PA-C  nitroGLYCERIN (NITROSTAT) 0.4 MG SL tablet Place 0.4 mg under the tongue every 5 (five) minutes x 3 doses as needed for chest pain.     [provider]  nystatin-triamcinolone ointment (MYCOLOG) Apply 1 application topically 2 (two) times daily. 01/21/21   Michiel Cowboy A, PA-C  ondansetron (ZOFRAN) 4 MG tablet Take 4 mg by mouth every 8 (eight) hours as needed for nausea or vomiting.    [provider]  sennosides-docusate sodium (SENOKOT-S) 8.6-50 MG tablet Take 1 tablet by mouth in the morning and at bedtime.    [provider]  tiZANidine (ZANAFLEX) 2 MG tablet Take 2 mg by mouth 3 (three) times daily as needed for muscle spasms.    [provider]  trospium (SANCTURA) 20 MG tablet Take 1 tablet (20 mg total) by mouth 2 (two) times daily. 04/01/21   Michiel Cowboy A, PA-C  vitamin B-12 (CYANOCOBALAMIN) 1000 MCG tablet Take 1,000 mcg by mouth daily.    [provider]  Vitamin D, Ergocalciferol, (DRISDOL) 50000 units CAPS capsule Take 50,000 Units by mouth every 7 (seven) days.    [provider]    Allergies Levaquin [levofloxacin in d5w], Iodinated diagnostic agents, and Latex  Family History  Problem Relation Age of Onset   Heart attack Mother    Hypertension Mother    Hypercholesterolemia Mother    Diabetes Mother    Stroke Father    Heart attack Father    Hypertension Father    Hypercholesterolemia Father    Diabetes Father    Diabetes Maternal Grandmother    Cancer - Other Maternal Grandmother     Social History Social History   Tobacco Use   Smoking status: Never   Smokeless tobacco: Never  Vaping Use   Vaping Use: Never used  Substance  Use Topics   Alcohol use: No   Drug use: No      Review of Systems Constitutional: No fever/chills Eyes: No visual changes. ENT: No sore throat. Cardiovascular: Positive chest pain Respiratory: Positive pain with breathing Gastrointestinal: Positive pain in the upper abdomen no nausea, no vomiting.  No diarrhea.  No constipation. Genitourinary: Negative for dysuria. Musculoskeletal: Negative for back pain. Skin: Negative for rash. Neurological: Negative for headaches, focal weakness or numbness. All other ROS negative ____________________________________________   PHYSICAL EXAM:  VITAL SIGNS: ED Triage Vitals  Enc Vitals Group     BP 04/22/21 1313 136/84     Pulse Rate 04/22/21 1313 80     Resp 04/22/21 1313 17     Temp 04/22/21 1313 98.5 F (36.9 C)     Temp Source 04/22/21 1313 Oral     SpO2 04/22/21 1313 98 %     Weight 04/22/21  1315 175 lb (79.4 kg)     Height 04/22/21 1315 5\' 1"  (1.549 m)     Head Circumference --      Peak Flow --      Pain Score 04/22/21 1315 8     Pain Loc --      Pain Edu? --      Excl. in GC? --     Constitutional: Alert and oriented. Well appearing and in no acute distress. Eyes: Conjunctivae are normal. EOMI. Head: Atraumatic. Nose: No congestion/rhinnorhea. Mouth/Throat: Mucous membranes are moist.   Neck: No stridor. Trachea Midline. FROM Cardiovascular: Normal rate, regular rhythm. Grossly normal heart sounds.  Good peripheral circulation. Respiratory: Normal respiratory effort.  No retractions. Lungs CTAB. Gastrointestinal: Tender in the right upper quadrant no distention. No abdominal bruits.  No rash noted on the abdomen Musculoskeletal: No lower extremity tenderness nor edema.  No joint effusions. Neurologic:  Normal speech and language. No gross focal neurologic deficits are appreciated.  Skin:  Skin is warm, dry and intact. No rash noted. Psychiatric: Mood and affect are normal. Speech and behavior are normal. GU: Deferred    ____________________________________________   LABS (all labs ordered are listed, but only abnormal results are displayed)  Labs Reviewed  BASIC METABOLIC PANEL - Abnormal; Notable for the following components:      Result Value   Sodium 130 (*)    Chloride 94 (*)    Glucose, Bld 115 (*)    Calcium 8.4 (*)    All other components within normal limits  CBC - Abnormal; Notable for the following components:   WBC 16.8 (*)    Hemoglobin 8.2 (*)    HCT 27.3 (*)    MCV 64.4 (*)    MCH 19.3 (*)    RDW 17.8 (*)    Platelets 566 (*)    All other components within normal limits  HEPATIC FUNCTION PANEL - Abnormal; Notable for the following components:   AST 10 (*)    All other components within normal limits  D-DIMER, QUANTITATIVE - Abnormal; Notable for the following components:   D-Dimer, Quant 1.21 (*)    All other components within normal limits  LIPASE, BLOOD  URINALYSIS, ROUTINE W REFLEX MICROSCOPIC  POC URINE PREG, ED  TROPONIN I (HIGH SENSITIVITY)   ____________________________________________   ED ECG REPORT I, 04/24/21, the attending physician, personally viewed and interpreted this ECG.  Normal sinus rate of 79, no ST elevation, T wave version in V2, normal intervals ____________________________________________  RADIOLOGY Concha Se, personally viewed and evaluated these images (plain radiographs) as part of my medical decision making, as well as reviewing the written report by the radiologist.  ED MD interpretation:  no pna   Official radiology report(s): DG Chest 2 View  Result Date: 04/22/2021 CLINICAL DATA:  Chest pain. EXAM: CHEST - 2 VIEW COMPARISON:  January 24, 2020. FINDINGS: The heart size and mediastinal contours are within normal limits. Both lungs are clear. The visualized skeletal structures are unremarkable. IMPRESSION: No active cardiopulmonary disease. Electronically Signed   By: January 26, 2020 M.D.   On: 04/22/2021 14:38     ____________________________________________   PROCEDURES  Procedure(s) performed (including Critical Care):  .1-3 Lead EKG Interpretation Performed by: 04/24/2021, MD Authorized by: Concha Se, MD     Interpretation: normal     ECG rate:  80s   ECG rate assessment: normal     Rhythm: sinus rhythm  Ectopy: none     Conduction: normal     ____________________________________________   INITIAL IMPRESSION / ASSESSMENT AND PLAN / ED COURSE   Victoria Medina was evaluated in Emergency Department on 04/22/2021 for the symptoms described in the history of present illness. She was evaluated in the context of the global COVID-19 pandemic, which necessitated consideration that the patient might be at risk for infection with the SARS-CoV-2 virus that causes COVID-19. Institutional protocols and algorithms that pertain to the evaluation of patients at risk for COVID-19 are in a state of rapid change based on information released by regulatory bodies including the CDC and federal and state organizations. These policies and algorithms were followed during the patient's care in the ED.    Most Likely DDx:  -Patient reports with pain in her chest pain with breathing.  Chest x-ray ordered to evaluate for pneumothorax.  Ultrasound evaluate for gallbladder pathology, D-dimer to evaluate for PE.  Will get EKG and cardiac markers and keep on the cardiac monitor to evaluate for ACS.  Difficulty with getting IV due to patient moving around will give some IM Versed to help facilitate   DDx that was also considered d/t potential to cause harm, but was found less likely based on history and physical (as detailed above): -PNA (no fevers, cough but CXR to evaluate) -PNX (reassured with equal b/l breath sounds, CXR to evaluate) -Symptomatic anemia (will get H&H) -Aortic Dissection as no tearing pain and no radiation to the mid back, pulses equal -Pericarditis no rub on exam, EKG changes or hx to  suggest dx -Tamponade (no notable SOB, tachycardic, hypotensive) -Esophageal rupture (no h/o diffuse vomitting/no crepitus)  D-dimer is elevated.  Patient declined anything for pain given she states that she just has allergies to everything.  Patient had oncoming team pending ultrasound.  We will add on CT images assume this is negative patient will be prepped for contrast.         ____________________________________________   FINAL CLINICAL IMPRESSION(S) / ED DIAGNOSES   Final diagnoses:  Abdominal pain     MEDICATIONS GIVEN DURING THIS VISIT:  Medications  hydrocortisone sodium succinate (SOLU-CORTEF) injection 200 mg (has no administration in time range)  diphenhydrAMINE (BENADRYL) capsule 50 mg (has no administration in time range)    Or  diphenhydrAMINE (BENADRYL) injection 50 mg (has no administration in time range)     ED Discharge Orders     None        Note:  This document was prepared using Dragon voice recognition software and may include unintentional dictation errors.    Concha Se, MD 04/22/21 1520

## 2021-04-22 NOTE — ED Provider Notes (Signed)
I sent care of the patient approximately 1600.  Please see (note for full details regarding patient's initial evaluation assessment.  In brief patient presents for assessment of a couple days of some worsening right upper quadrant abdominal pain.  Right upper quadrant ultrasound is concerning for acute cholecystitis.  Patient does not appear septic but does have elevated white blood cell count.  I did consult surgery and Dr. Lady Gary did come and see the patient.  She recommended hospitalist admission given comorbidities and medical complexity.  Patient will be admitted to medicine service for further evaluation and management.   Gilles Chiquito, MD 04/22/21 [phone]

## 2021-04-22 NOTE — Progress Notes (Signed)
Pt has large area Skin grafts & scarring on Rt arm - Lt arm is contracted due to old stroke. Unable to access any IV site with or without ultrasound ED RN & ED MD aware

## 2021-04-22 NOTE — ED Triage Notes (Signed)
Pt c/o right sided chest pain that radiates into the right arm and back since last night, worse with deep breathing. Pt is in NAD on arrival, see first nurse note for further info

## 2021-04-22 NOTE — ED Notes (Signed)
Pt refusing versed states it does not work

## 2021-04-22 NOTE — Consult Note (Signed)
Reason for Consult: Right upper quadrant pain, possible acute cholecystitis Referring Physician: Antoine Primas, MD (emergency medicine)  Victoria Medina is an 60 y.o. female.  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  She resides at [facility] and came to the emergency department today with chest pain.  She actually localizes the pain to her epigastrium and right upper quadrant.  She was evaluated in the emergency department and found to have an elevated white blood cell count at 16.8K, as well as a right upper quadrant ultrasound that demonstrated mild gallbladder wall thickening, cholelithiasis and layering sludge.  Normal common bile duct.  The radiologist interpreted this as cholelithiasis with mild gallbladder wall thickening which may reflect cholecystitis.  The technologist did not indicate whether a sonographic Eulah Pont sign was present.  General surgery has been consulted in this context for further evaluation and management.  The patient reports that she has had this pain for over a month.  She says that the staff at her living facility told her that she just was constipated and needed to have a bowel movement.  She does have chronic constipation due to her history of stroke and some of the medications she takes.  The pain does not seem to be exacerbated by eating and nothing specific seems to alleviate it.  She says that narcotic pain medications do not work and that she gets itchy if she takes them without Benadryl; she is able to tolerate them if she takes Benadryl at the same time, however.  She denies any nausea or vomiting.  No subjective fevers or chills.  She is not aware of having had prior issues with her gallbladder, nor was she  aware that she had gallstones.  Past Medical History:  Diagnosis Date   Blind    Diabetes mellitus without complication (HCC)    Hyperlipemia    Hypertension    Stroke [facility])    Patient Active Problem List   Diagnosis Date Noted   Cholecystitis 04/22/2021   Leukocytosis 04/22/2021   Acute ST elevation myocardial infarction (STEMI) of inferolateral wall (HCC) 01/25/2020   NSTEMI (non-ST elevated myocardial infarction) (HCC) 01/24/2020   Stroke (HCC)    Diabetes mellitus without complication (HCC)    Hypertension    Blind    Hypothyroidism (acquired)    Anxiety    GERD (gastroesophageal reflux disease) 12/18/2014   Diabetes mellitus type 2 in obese (HCC) 01/22/2014   Left knee pain 08/22/2013   Impingement syndrome of right shoulder 07/07/2012   Right shoulder pain 07/07/2012   Obesity 06/04/2012   Palpitations 06/04/2012   DVT (deep venous thrombosis) (HCC) 04/03/2012   PE (pulmonary thromboembolism) (HCC) 04/02/2012   Cervical intraepithelial glandular neoplasia 02/07/2011   Generalized anxiety disorder 02/07/2011   Insomnia 02/07/2011   Intractable chronic migraine without aura 02/07/2011   Mixed hyperlipidemia 02/07/2011   Myalgia and myositis, unspecified 02/07/2011   Obstructive sleep apnea (adult) (pediatric) 02/07/2011   Syncope and collapse 02/07/2011     Past Surgical History:  Procedure Laterality Date   ABDOMINAL HYSTERECTOMY     BACK SURGERY     CARDIAC CATHETERIZATION     CORONARY/GRAFT ACUTE MI REVASCULARIZATION N/A 01/25/2020   Procedure: Coronary/Graft Acute MI Revascularization;  Surgeon: Iran Ouch, MD;  Location: [facility] INVASIVE CV LAB;  Service: Cardiovascular;  Laterality: N/A;   FRACTURE SURGERY     LEFT HEART CATH AND CORONARY ANGIOGRAPHY N/A 01/25/2020   Procedure: LEFT HEART CATH AND CORONARY ANGIOGRAPHY;  Surgeon: Iran Ouch, MD;  Location: [facility] INVASIVE CV LAB;  Service: Cardiovascular;  Laterality: N/A;   SKIN GRAFT Right      Family History  Problem Relation Age of Onset   Heart attack Mother    Hypertension Mother    Hypercholesterolemia Mother    Diabetes Mother    Stroke Father    Heart attack Father    Hypertension Father    Hypercholesterolemia Father    Diabetes Father    Diabetes Maternal Grandmother    Cancer - Other Maternal Grandmother     Social History:  reports that she has never smoked. She has never used smokeless tobacco. She reports that she does not drink alcohol and does not use drugs.  Allergies:  Allergies  Allergen Reactions   Levaquin [Levofloxacin In D5w] Anaphylaxis and Swelling   Iodinated Diagnostic Agents Itching    Severe itching   Latex Dermatitis    Medications: I have reviewed the patient's current medications.  Results for orders placed or performed during the hospital encounter of 04/22/21 (from the past 48 hour(s))  Basic metabolic panel     Status: Abnormal   Collection Time: 04/22/21  1:34 PM  Result Value Ref Range   Sodium 130 (L) 135 - 145 mmol/L   Potassium 3.8 3.5 - 5.1 mmol/L   Chloride 94 (L) 98 - 111 mmol/L   CO2 29 22 - 32 mmol/L   Glucose, Bld 115 (H) 70 - 99 mg/dL    Comment: Glucose reference range applies only to samples taken after fasting for at least 8 hours.   BUN 9 6 - 20 mg/dL   Creatinine, Ser 4.58 0.44 - 1.00 mg/dL   Calcium 8.4 (L) 8.9 - 10.3 mg/dL   GFR, Estimated >09 >98 mL/min    Comment: (NOTE) Calculated using the CKD-EPI Creatinine Equation (2021)    Anion gap 7 5 - 15    Comment: Performed at [facility], [address] [place], [place] [address]  CBC     Status: Abnormal   Collection Time: 04/22/21  1:34 PM  Result Value Ref Range   WBC 16.8 (H) 4.0 - 10.5 K/uL   RBC 4.24 3.87 - 5.11 MIL/uL   Hemoglobin 8.2 (L) 12.0 - 15.0 g/dL    Comment: Reticulocyte Hemoglobin testing may be clinically indicated, consider ordering this additional test [id number]    HCT 27.3 (L) 36.0 - 46.0 %   MCV 64.4 (L) 80.0  - 100.0 fL   MCH 19.3 (L) 26.0 - 34.0 pg   MCHC 30.0 30.0 - 36.0 g/dL   RDW 41.9 (H) 37.9 - 02.4 %   Platelets 566 (H) 150 - 400 K/uL   nRBC 0.0 0.0 - 0.2 %    Comment: Performed at [facility], [address] [place], [place] [address]  Troponin I (High Sensitivity)     Status: None   Collection Time: 04/22/21  1:34 PM  Result Value Ref Range   Troponin I (High Sensitivity) 4 <18 ng/L    Comment: (NOTE) Elevated high sensitivity troponin I (hsTnI) values and significant  changes across serial measurements may suggest ACS but many other  chronic and acute conditions are known to elevate hsTnI results.  Refer to the Links section for chest pain algorithms and additional  guidance.  Performed at [facility], [address] [place], [place] [address]   Hepatic function panel     Status: Abnormal   Collection Time: 04/22/21  1:34 PM  Result Value Ref Range   Total Protein 7.5 6.5 - 8.1 g/dL   Albumin 3.8 3.5 - 5.0 g/dL   AST 10 (L) 15 - 41 U/L   ALT 9 0 - 44 U/L   Alkaline Phosphatase 84 38 - 126 U/L   Total Bilirubin 0.6 0.3 - 1.2 mg/dL   Bilirubin, Direct <3.2 0.0 - 0.2 mg/dL   Indirect Bilirubin NOT CALCULATED 0.3 - 0.9 mg/dL    Comment: Performed at [facility], [address] [place], [place] [address]  Lipase, blood     Status: None   Collection Time: 04/22/21  1:34 PM  Result Value Ref Range   Lipase 34 11 - 51 U/L    Comment: Performed at [facility], [address] [place], [place] [address]  D-dimer, quantitative     Status: Abnormal   Collection Time: 04/22/21  1:40 PM  Result Value Ref Range   D-Dimer, Quant 1.21 (H) 0.00 - 0.50 ug/mL-FEU    Comment: (NOTE) At the manufacturer cut-off value of 0.5 g/mL FEU, this assay has a negative predictive value of 95-100%.This assay is intended for use in conjunction with a clinical pretest probability (PTP) assessment model to exclude pulmonary embolism (PE) and deep venous  thrombosis (DVT) in outpatients suspected of PE or DVT. Results should be correlated with clinical presentation. Performed at [facility], [address] [place], [place] [address]     DG Chest 2 View  Result Date: 04/22/2021 CLINICAL DATA:  Chest pain. EXAM: CHEST - 2 VIEW COMPARISON:  January 24, 2020. FINDINGS: The heart size and mediastinal contours are within normal limits. Both lungs are clear. The visualized skeletal structures are unremarkable. IMPRESSION: No active cardiopulmonary disease. Electronically Signed   By: Lupita Raider M.D.   On: 04/22/2021 14:38   US ABDOMEN LIMITED RUQ (LIVER/GB)  Result Date: 04/22/2021 CLINICAL DATA:  Abdominal pain for 1 month EXAM: ULTRASOUND ABDOMEN LIMITED RIGHT UPPER QUADRANT COMPARISON:  CT chest 01/24/2020. FINDINGS: Gallbladder: 9 mm echogenic, shadowing stone within the gallbladder. Small amount of layering sludge. Mild diffuse gallbladder wall thickening of up to 3.7 mm. Technologist did not indicate whether a sonographic Murphy's sign was present. Common bile duct: Diameter: 4 mm. Liver: No focal lesion identified. Within normal limits in parenchymal echogenicity. Portal vein is patent on color Doppler imaging with normal direction of blood flow towards the liver. Other: Trace perihepatic free fluid. IMPRESSION: 1. Cholelithiasis with mild gallbladder wall thickening, which may reflect cholecystitis. 2. Trace perihepatic free fluid. Electronically Signed   By: Duanne Guess D.O.   On: 04/22/2021 15:22    Review of Systems  Constitutional:  Positive for fatigue.  Cardiovascular:  Positive for chest pain.  Gastrointestinal:  Positive for abdominal pain.  Neurological:        Residual left hemiplegia from stroke  Blood pressure 140/70, pulse 81, temperature 98.8 F (37.1 C), temperature source Oral, resp. rate 17, height 5\' 1"  (1.549 m), weight 79.4 kg, SpO2 98 %. Body mass index is 33.07 kg/m.  Physical Exam Constitutional:       General: She is not in acute distress.    Appearance: She is obese.  HENT:     Head: Normocephalic and atraumatic.     Nose:     Comments: Covered  with a mask Eyes:     General: No scleral icterus.       Right eye: No discharge.        Left eye: No discharge.     Comments: Roving eye movements consistent with her history of cortical blindness.  Cardiovascular:     Rate and Rhythm: Normal rate and regular rhythm.  Abdominal:     Palpations: Abdomen is soft.     Tenderness: There is abdominal tenderness. There is no guarding or rebound.     Comments: Tender to palpation in the right upper quadrant and epigastrium.  Murphy sign is negative.  Genitourinary:    Comments: Chronic indwelling Foley catheter.  Patient states that she is seeking to get a suprapubic catheter secondary to urethral breakdown. Musculoskeletal:        General: No swelling.     Comments: Left arm contracture  Skin:    General: Skin is warm and dry.     Coloration: Skin is not jaundiced.     Comments: Skin graft on right arm  Neurological:     Mental Status: She is alert and oriented to person, place, and time.     Comments: Left hemiplegia with left arm in contracture.  Psychiatric:        Mood and Affect: Mood normal.        Behavior: Behavior normal.    Assessment/Plan: This is a 60 year old woman with significant and complex medical comorbidities including multiple strokes with residual hemiplegia and cortical blindness, multiple myocardial infarctions with placement of stents; most recent ST elevation MI was June 2021 and she continues to be on dual antiplatelet therapy.  She has also had DVT and PE in the past.  She presented to the emergency department with chest pain, but upon further evaluation the pain was actually more in her right upper quadrant.  Although the ultrasound imaging is not as impressive, her physical examination is fairly consistent with acute cholecystitis.  She also has other  complicating factors including an indwelling Foley catheter with urethral erosion (per patient, could not verify in the electronic medical record).  She did have a recent urinalysis when she was seen at [facility] urology that was negative for leukocytes and nitrites.  It seems less likely that her leukocytosis is secondary to urinary factors and probably reflects intra-abdominal infection.  Due to being on aspirin and Brilinta, I think proceeding to the operating room at this time would expose her to an unacceptable risk of bleeding, given that she is currently not septic.  I have placed a consult to cardiology for recommendations regarding operative optimization versus need for percutaneous cholecystostomy tube.  Although she has thrombocytosis, we may actually need to transfuse functional platelets in order to facilitate an operation or other invasive procedure, versus continuing with medical management with antibiotics alone.  Due to her medical complexity, I have recommended that she be admitted to the hospital medicine service and we will follow along as consultants.  In addition, due to her extremely poor vascular access, I have taken the liberty of ordering a PICC line to be placed by interventional radiology  Duanne Guess 04/22/2021, 5:34 PM

## 2021-04-22 NOTE — Progress Notes (Signed)
Patient arrived to the floor from ED. Has a chronic foley. Refused Telemetry and refused labs. Cursed at [place] calling hospital a "craphole".

## 2021-04-22 NOTE — ED Notes (Signed)
Messaged physician on pts request for valium

## 2021-04-22 NOTE — ED Notes (Signed)
Attempted to place a [place] iv , pt yells once needle touches pts skin , unable to continue , ed md made aware.

## 2021-04-22 NOTE — ED Provider Notes (Addendum)
HPI: Pt is a 60 y.o. female who presents with complaints of chest pain/sob and RUQ pain.   The patient p/w  RUQ pain, chest pain sob started yesterday.   ROS: Denies fever  Past Medical History:  Diagnosis Date   Blind    Diabetes mellitus without complication (HCC)    Hyperlipemia    Hypertension    Stroke (HCC)    Vitals:   04/22/21 1313  BP: 136/84  Pulse: 80  Resp: 17  Temp: 98.5 F (36.9 C)  SpO2: 98%    Focused Physical Exam: Gen: No acute distress Head: atraumatic, normocephalic Eyes: Extraocular movements grossly intact; conjunctiva clear CV: RRR Lung: No increased WOB, no stridor GI: ND, no obvious masses, tender RUQ  Neuro: Alert and awake  Medical Decision Making and Plan: Given the patient's initial medical screening exam, the following diagnostic evaluation has been ordered. The patient will be placed in the appropriate treatment space, once one is available, to complete the evaluation and treatment. I have discussed the plan of care with the patient and I have advised the patient that an ED physician or mid-level practitioner will reevaluate their condition after the test results have been received, as the results may give them additional insight into the type of treatment they may need.   Diagnostics: [place] labs   Treatments: none    Concha Se, MD 04/22/21 1323    Concha Se, MD 04/22/21 1323

## 2021-04-22 NOTE — Progress Notes (Signed)
Informed patient's sister, Kaylinn Dedic, that patient is here per patient's request.

## 2021-04-22 NOTE — ED Notes (Signed)
Phlebotomy unable to get additional labs.

## 2021-04-22 NOTE — ED Notes (Signed)
Pt continue to push call light almost every 5 minutes. Ed rn addresses pts concerns prior to leaving

## 2021-04-22 NOTE — ED Notes (Signed)
Pt continues to push call light every 5 minutes , pts bed adjusted multiple time, remote given to patient ed rn changed tv channels multiple times , pt continues to complain about the bed and tv, ed rn messaged dr cox regarding pt wanting additional valium , awaiting to hear back and orders, pt has a clean bed on floor so a more comfortable bed will be provided , pt made aware.

## 2021-04-23 ENCOUNTER — Encounter: Payer: Self-pay | Admitting: Internal Medicine

## 2021-04-23 ENCOUNTER — Observation Stay: Payer: Medicare Other

## 2021-04-23 DIAGNOSIS — H47619 Cortical blindness, unspecified side of brain: Secondary | ICD-10-CM | POA: Diagnosis present

## 2021-04-23 DIAGNOSIS — H548 Legal blindness, as defined in USA: Secondary | ICD-10-CM | POA: Diagnosis present

## 2021-04-23 DIAGNOSIS — I10 Essential (primary) hypertension: Secondary | ICD-10-CM | POA: Diagnosis present

## 2021-04-23 DIAGNOSIS — N319 Neuromuscular dysfunction of bladder, unspecified: Secondary | ICD-10-CM | POA: Diagnosis present

## 2021-04-23 DIAGNOSIS — D509 Iron deficiency anemia, unspecified: Secondary | ICD-10-CM

## 2021-04-23 DIAGNOSIS — R1011 Right upper quadrant pain: Secondary | ICD-10-CM | POA: Diagnosis not present

## 2021-04-23 DIAGNOSIS — I214 Non-ST elevation (NSTEMI) myocardial infarction: Secondary | ICD-10-CM | POA: Diagnosis present

## 2021-04-23 DIAGNOSIS — I69354 Hemiplegia and hemiparesis following cerebral infarction affecting left non-dominant side: Secondary | ICD-10-CM | POA: Diagnosis not present

## 2021-04-23 DIAGNOSIS — K8010 Calculus of gallbladder with chronic cholecystitis without obstruction: Secondary | ICD-10-CM | POA: Diagnosis present

## 2021-04-23 DIAGNOSIS — Z823 Family history of stroke: Secondary | ICD-10-CM | POA: Diagnosis not present

## 2021-04-23 DIAGNOSIS — D649 Anemia, unspecified: Secondary | ICD-10-CM | POA: Diagnosis not present

## 2021-04-23 DIAGNOSIS — Z833 Family history of diabetes mellitus: Secondary | ICD-10-CM | POA: Diagnosis not present

## 2021-04-23 DIAGNOSIS — Z83438 Family history of other disorder of lipoprotein metabolism and other lipidemia: Secondary | ICD-10-CM | POA: Diagnosis not present

## 2021-04-23 DIAGNOSIS — Z20822 Contact with and (suspected) exposure to covid-19: Secondary | ICD-10-CM | POA: Diagnosis present

## 2021-04-23 DIAGNOSIS — Z01810 Encounter for preprocedural cardiovascular examination: Secondary | ICD-10-CM | POA: Diagnosis not present

## 2021-04-23 DIAGNOSIS — E119 Type 2 diabetes mellitus without complications: Secondary | ICD-10-CM | POA: Diagnosis not present

## 2021-04-23 DIAGNOSIS — I252 Old myocardial infarction: Secondary | ICD-10-CM | POA: Diagnosis not present

## 2021-04-23 DIAGNOSIS — I25118 Atherosclerotic heart disease of native coronary artery with other forms of angina pectoris: Secondary | ICD-10-CM | POA: Diagnosis present

## 2021-04-23 DIAGNOSIS — Z6833 Body mass index (BMI) 33.0-33.9, adult: Secondary | ICD-10-CM | POA: Diagnosis not present

## 2021-04-23 DIAGNOSIS — E669 Obesity, unspecified: Secondary | ICD-10-CM | POA: Diagnosis present

## 2021-04-23 DIAGNOSIS — F411 Generalized anxiety disorder: Secondary | ICD-10-CM | POA: Diagnosis present

## 2021-04-23 DIAGNOSIS — E782 Mixed hyperlipidemia: Secondary | ICD-10-CM | POA: Diagnosis present

## 2021-04-23 DIAGNOSIS — K219 Gastro-esophageal reflux disease without esophagitis: Secondary | ICD-10-CM | POA: Diagnosis present

## 2021-04-23 DIAGNOSIS — Z8249 Family history of ischemic heart disease and other diseases of the circulatory system: Secondary | ICD-10-CM | POA: Diagnosis not present

## 2021-04-23 DIAGNOSIS — K819 Cholecystitis, unspecified: Secondary | ICD-10-CM | POA: Diagnosis not present

## 2021-04-23 DIAGNOSIS — Z66 Do not resuscitate: Secondary | ICD-10-CM | POA: Diagnosis present

## 2021-04-23 DIAGNOSIS — D72828 Other elevated white blood cell count: Secondary | ICD-10-CM | POA: Diagnosis not present

## 2021-04-23 DIAGNOSIS — D75839 Thrombocytosis, unspecified: Secondary | ICD-10-CM | POA: Diagnosis not present

## 2021-04-23 DIAGNOSIS — E039 Hypothyroidism, unspecified: Secondary | ICD-10-CM | POA: Diagnosis present

## 2021-04-23 DIAGNOSIS — K8020 Calculus of gallbladder without cholecystitis without obstruction: Secondary | ICD-10-CM | POA: Diagnosis not present

## 2021-04-23 LAB — TROPONIN I (HIGH SENSITIVITY): Troponin I (High Sensitivity): 9 ng/L (ref <18)

## 2021-04-23 LAB — BASIC METABOLIC PANEL
Anion gap: 10 (ref 5–15)
BUN: 9 mg/dL (ref 6–20)
CO2: 27 mmol/L (ref 22–32)
Calcium: 8.6 mg/dL — ABNORMAL LOW (ref 8.9–10.3)
Chloride: 99 mmol/L (ref 98–111)
Creatinine, Ser: 0.74 mg/dL (ref 0.44–1.00)
GFR, Estimated: >60 mL/min (ref >60)
Glucose, Bld: 146 mg/dL — ABNORMAL HIGH (ref 70–99)
Potassium: 3.9 mmol/L (ref 3.5–5.1)
Sodium: 136 mmol/L (ref 135–145)

## 2021-04-23 LAB — HIV ANTIBODY (ROUTINE TESTING W REFLEX): HIV Screen 4th Generation wRfx: NONREACTIVE

## 2021-04-23 LAB — HEPATIC FUNCTION PANEL
ALT: 8 U/L (ref 0–44)
AST: 12 U/L — ABNORMAL LOW (ref 15–41)
Albumin: 3.8 g/dL (ref 3.5–5.0)
Alkaline Phosphatase: 89 U/L (ref 38–126)
Bilirubin, Direct: <0.1 mg/dL (ref 0.0–0.2)
Total Bilirubin: 0.5 mg/dL (ref 0.3–1.2)
Total Protein: 7.3 g/dL (ref 6.5–8.1)

## 2021-04-23 LAB — CBC
HCT: 27.4 % — ABNORMAL LOW (ref 36.0–46.0)
Hemoglobin: 7.9 g/dL — ABNORMAL LOW (ref 12.0–15.0)
MCH: 18.8 pg — ABNORMAL LOW (ref 26.0–34.0)
MCHC: 28.8 g/dL — ABNORMAL LOW (ref 30.0–36.0)
MCV: 65.2 fL — ABNORMAL LOW (ref 80.0–100.0)
Platelets: 576 10*3/uL — ABNORMAL HIGH (ref 150–400)
RBC: 4.2 MIL/uL (ref 3.87–5.11)
RDW: 18.1 % — ABNORMAL HIGH (ref 11.5–15.5)
WBC: 16.2 10*3/uL — ABNORMAL HIGH (ref 4.0–10.5)
nRBC: 0 % (ref 0.0–0.2)

## 2021-04-23 LAB — PROCALCITONIN: Procalcitonin: 0.21 ng/mL

## 2021-04-23 MED ORDER — DIAZEPAM 5 MG PO TABS
10.0000 mg | ORAL_TABLET | Freq: Two times a day (BID) | ORAL | Status: DC | PRN
  Administered 2021-04-23 – 2021-04-24 (×4): 10 mg via ORAL
  Filled 2021-04-23 (×4): qty 2

## 2021-04-23 MED ORDER — HYDROMORPHONE HCL 1 MG/ML IJ SOLN
1.0000 mg | INTRAMUSCULAR | Status: AC | PRN
  Administered 2021-04-23 (×2): 1 mg via INTRAVENOUS
  Filled 2021-04-23 (×2): qty 1

## 2021-04-23 MED ORDER — MORPHINE SULFATE (PF) 4 MG/ML IV SOLN
4.0000 mg | INTRAVENOUS | Status: DC | PRN
Start: 2021-04-23 — End: 2021-04-23

## 2021-04-23 MED ORDER — ENSURE ENLIVE PO LIQD
237.0000 mL | Freq: Three times a day (TID) | ORAL | Status: DC
  Administered 2021-04-23: 100 mL via ORAL

## 2021-04-23 MED ORDER — DIAZEPAM 5 MG PO TABS: 10.0000 mg | ORAL_TABLET | Freq: Three times a day (TID) | ORAL | Status: DC | PRN

## 2021-04-23 MED ORDER — HEPARIN SODIUM (PORCINE) 5000 UNIT/ML IJ SOLN: 5000.0000 [IU] | Freq: Three times a day (TID) | INTRAMUSCULAR | Status: AC

## 2021-04-23 MED ORDER — MORPHINE SULFATE (PF) 4 MG/ML IV SOLN: 4.0000 mg | INTRAVENOUS | Status: DC | PRN

## 2021-04-23 MED ORDER — TECHNETIUM TC 99M MEBROFENIN IV KIT: 5.0000 | PACK | Freq: Once | INTRAVENOUS | Status: DC | PRN

## 2021-04-23 MED ORDER — CHLORHEXIDINE GLUCONATE CLOTH 2 % EX PADS
6.0000 | MEDICATED_PAD | Freq: Every day | CUTANEOUS | Status: DC
  Administered 2021-04-23: 6 via TOPICAL

## 2021-04-23 NOTE — Consult Note (Signed)
Reason for Consult: Right upper quadrant pain, possible acute cholecystitis Referring Physician: Antoine Primas, MD (emergency medicine)  Victoria Medina is an 60 y.o. female.  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  She resides at [facility] and came to the emergency department today with chest pain.  She actually localizes the pain to her epigastrium and right upper quadrant.  She was evaluated in the emergency department and found to have an elevated white blood cell count at 16.8K, as well as a right upper quadrant ultrasound that demonstrated mild gallbladder wall thickening, cholelithiasis and layering sludge.  Normal common bile duct.  The radiologist interpreted this as cholelithiasis with mild gallbladder wall thickening which may reflect cholecystitis.  The technologist did not indicate whether a sonographic Eulah Pont sign was present.  General surgery has been consulted in this context for further evaluation and management.  The patient reports that she has had this pain for over a month.  She says that the staff at her living facility told her that she just was constipated and needed to have a bowel movement.  She does have chronic constipation due to her history of stroke and some of the medications she takes.  The pain does not seem to be exacerbated by eating and nothing specific seems to alleviate it.  She says that narcotic pain medications do not work and that she gets itchy if she takes them without Benadryl; she is able to tolerate them if she takes Benadryl at the same time, however.  She denies any nausea or vomiting.  No subjective fevers or chills.  She is not aware of having had prior issues with her gallbladder, nor was she  aware that she had gallstones.  Interval History 04/23/20: HIDA scan ordered by hospitalist service, but unable to be performed as patient reports having not eaten for several days prior to admission.  Per radiology tech, this can result in a false positive scan.  Further review of the electronic medical record demonstrates that she has persistently demonstrated an elevated white blood count going back to at least 2016.  She also has chronic thrombocytosis.  Cardiology consult placed, but they have not yet seen her. This afternoon when I saw her, she was sitting up and complaining of chest pain.  She stated that this did not feel like the chest pain she had had associated with her cardiac events, nor did it feel like GERD.  Past Medical History:  Diagnosis Date   Blind    Diabetes mellitus without complication (HCC)    Hyperlipemia    Hypertension    Stroke [facility])    Patient Active Problem List   Diagnosis Date Noted   Cholecystitis 04/22/2021   Leukocytosis 04/22/2021   Acute ST elevation myocardial infarction (STEMI) of inferolateral wall (HCC) 01/25/2020   NSTEMI (non-ST elevated myocardial infarction) (HCC) 01/24/2020   Stroke (HCC)    Diabetes mellitus without complication (HCC)    Hypertension    Blind    Hypothyroidism (acquired)    Anxiety    GERD (gastroesophageal reflux disease) 12/18/2014   Diabetes mellitus type 2 in obese (HCC) 01/22/2014   Left knee pain 08/22/2013   Impingement syndrome of right shoulder 07/07/2012   Right shoulder pain 07/07/2012   Obesity 06/04/2012   Palpitations 06/04/2012  DVT (deep venous thrombosis) (HCC) 04/03/2012   PE (pulmonary thromboembolism) (HCC) 04/02/2012   Cervical intraepithelial glandular neoplasia 02/07/2011   Generalized anxiety disorder 02/07/2011   Insomnia 02/07/2011   Intractable chronic migraine without aura 02/07/2011   Mixed hyperlipidemia 02/07/2011   Myalgia and myositis, unspecified 02/07/2011   Obstructive sleep  apnea (adult) (pediatric) 02/07/2011   Syncope and collapse 02/07/2011     Past Surgical History:  Procedure Laterality Date   ABDOMINAL HYSTERECTOMY     BACK SURGERY     CARDIAC CATHETERIZATION     CORONARY/GRAFT ACUTE MI REVASCULARIZATION N/A 01/25/2020   Procedure: Coronary/Graft Acute MI Revascularization;  Surgeon: Iran Ouch, MD;  Location: [facility] INVASIVE CV LAB;  Service: Cardiovascular;  Laterality: N/A;   FRACTURE SURGERY     LEFT HEART CATH AND CORONARY ANGIOGRAPHY N/A 01/25/2020   Procedure: LEFT HEART CATH AND CORONARY ANGIOGRAPHY;  Surgeon: Iran Ouch, MD;  Location: [facility] INVASIVE CV LAB;  Service: Cardiovascular;  Laterality: N/A;   SKIN GRAFT Right     Family History  Problem Relation Age of Onset   Heart attack Mother    Hypertension Mother    Hypercholesterolemia Mother    Diabetes Mother    Stroke Father    Heart attack Father    Hypertension Father    Hypercholesterolemia Father    Diabetes Father    Diabetes Maternal Grandmother    Cancer - Other Maternal Grandmother     Social History:  reports that she has never smoked. She has never used smokeless tobacco. She reports that she does not drink alcohol and does not use drugs.  Allergies:  Allergies  Allergen Reactions   Levaquin [Levofloxacin In D5w] Anaphylaxis and Swelling   Iodinated Diagnostic Agents Itching    Severe itching   Latex Dermatitis    Medications: I have reviewed the patient's current medications.  Results for orders placed or performed during the hospital encounter of 04/22/21 (from the past 48 hour(s))  Urinalysis, Routine w reflex microscopic     Status: Abnormal   Collection Time: 04/22/21  1:30 PM  Result Value Ref Range   Color, Urine YELLOW (A) YELLOW   APPearance HAZY (A) CLEAR   Specific Gravity, Urine 1.006 1.005 - 1.030   pH 7.0 5.0 - 8.0   Glucose, UA NEGATIVE NEGATIVE mg/dL   Hgb urine dipstick SMALL (A) NEGATIVE   Bilirubin Urine NEGATIVE NEGATIVE    Ketones, ur NEGATIVE NEGATIVE mg/dL   Protein, ur NEGATIVE NEGATIVE mg/dL   Nitrite POSITIVE (A) NEGATIVE   Leukocytes,Ua LARGE (A) NEGATIVE   RBC / HPF 0-5 0 - 5 RBC/hpf   WBC, UA 11-20 0 - 5 WBC/hpf   Bacteria, UA FEW (A) NONE SEEN   Squamous Epithelial / LPF NONE SEEN 0 - 5    Comment: Performed at [facility], [address] [place], [place] [address]  Basic metabolic panel     Status: Abnormal   Collection Time: 04/22/21  1:34 PM  Result Value Ref Range   Sodium 130 (L) 135 - 145 mmol/L   Potassium 3.8 3.5 - 5.1 mmol/L   Chloride 94 (L) 98 - 111 mmol/L   CO2 29 22 - 32 mmol/L   Glucose, Bld 115 (H) 70 - 99 mg/dL    Comment: Glucose reference range applies only to samples taken after fasting for at least 8 hours.   BUN 9 6 - 20 mg/dL   Creatinine, Ser 3.22 0.44 - 1.00 mg/dL  Calcium 8.4 (L) 8.9 - 10.3 mg/dL   GFR, Estimated >40 >98 mL/min    Comment: (NOTE) Calculated using the CKD-EPI Creatinine Equation (2021)    Anion gap 7 5 - 15    Comment: Performed at [facility], [address] [place], [place] [address]  CBC     Status: Abnormal   Collection Time: 04/22/21  1:34 PM  Result Value Ref Range   WBC 16.8 (H) 4.0 - 10.5 K/uL   RBC 4.24 3.87 - 5.11 MIL/uL   Hemoglobin 8.2 (L) 12.0 - 15.0 g/dL    Comment: Reticulocyte Hemoglobin testing may be clinically indicated, consider ordering this additional test [id number]    HCT 27.3 (L) 36.0 - 46.0 %   MCV 64.4 (L) 80.0 - 100.0 fL   MCH 19.3 (L) 26.0 - 34.0 pg   MCHC 30.0 30.0 - 36.0 g/dL   RDW 30.8 (H) 65.7 - 84.6 %   Platelets 566 (H) 150 - 400 K/uL   nRBC 0.0 0.0 - 0.2 %    Comment: Performed at [facility], [address] [place], [place] [address]  Troponin I (High Sensitivity)     Status: None   Collection Time: 04/22/21  1:34 PM  Result Value Ref Range   Troponin I (High Sensitivity) 4 <18 ng/L    Comment: (NOTE) Elevated high sensitivity troponin I (hsTnI) values and  significant  changes across serial measurements may suggest ACS but many other  chronic and acute conditions are known to elevate hsTnI results.  Refer to the Links section for chest pain algorithms and additional  guidance. Performed at [facility], [address] [place], [place] [address]   Hepatic function panel     Status: Abnormal   Collection Time: 04/22/21  1:34 PM  Result Value Ref Range   Total Protein 7.5 6.5 - 8.1 g/dL   Albumin 3.8 3.5 - 5.0 g/dL   AST 10 (L) 15 - 41 U/L   ALT 9 0 - 44 U/L   Alkaline Phosphatase 84 38 - 126 U/L   Total Bilirubin 0.6 0.3 - 1.2 mg/dL   Bilirubin, Direct <2.4 0.0 - 0.2 mg/dL   Indirect Bilirubin NOT CALCULATED 0.3 - 0.9 mg/dL    Comment: Performed at [facility], [address] [place], [place] [address]  Lipase, blood     Status: None   Collection Time: 04/22/21  1:34 PM  Result Value Ref Range   Lipase 34 11 - 51 U/L    Comment: Performed at [facility], [address] [place], [place] [address]  D-dimer, quantitative     Status: Abnormal   Collection Time: 04/22/21  1:40 PM  Result Value Ref Range   D-Dimer, Quant 1.21 (H) 0.00 - 0.50 ug/mL-FEU    Comment: (NOTE) At the manufacturer cut-off value of 0.5 g/mL FEU, this assay has a negative predictive value of 95-100%.This assay is intended for use in conjunction with a clinical pretest probability (PTP) assessment model to exclude pulmonary embolism (PE) and deep venous thrombosis (DVT) in outpatients suspected of PE or DVT. Results should be correlated with clinical presentation. Performed at [facility], [address] [place], [place] [address]   Resp Panel by RT-PCR (Flu A&B, Covid) Nasopharyngeal Swab     Status: None   Collection Time: 04/22/21  2:28 PM   Specimen: Nasopharyngeal Swab; Nasopharyngeal(NP) swabs in vial transport medium  Result Value Ref Range   SARS Coronavirus 2 by RT  PCR NEGATIVE NEGATIVE    Comment:  (NOTE) SARS-CoV-2 target nucleic acids are NOT DETECTED.  The SARS-CoV-2 RNA is generally detectable in upper respiratory specimens during the acute phase of infection. The lowest concentration of SARS-CoV-2 viral copies this assay can detect is 138 copies/mL. A negative result does not preclude SARS-Cov-2 infection and should not be used as the sole basis for treatment or other patient management decisions. A negative result may occur with  improper specimen collection/handling, submission of specimen other than nasopharyngeal swab, presence of viral mutation(s) within the areas targeted by this assay, and inadequate number of viral copies(<138 copies/mL). A negative result must be combined with clinical observations, patient history, and epidemiological information. The expected result is Negative.  Fact Sheet for Patients:  BloggerCourse.com  Fact Sheet for Healthcare Providers:  SeriousBroker.it  This test is no t yet approved or cleared by the [place] FDA and  has been authorized for detection and/or diagnosis of SARS-CoV-2 by FDA under an Emergency Use Authorization (EUA). This EUA will remain  in effect (meaning this test can be used) for the duration of the COVID-19 declaration under Section 564(b)(1) of the Act, 21 U.S.C.section 360bbb-3(b)(1), unless the authorization is terminated  or revoked sooner.       Influenza A by PCR NEGATIVE NEGATIVE   Influenza B by PCR NEGATIVE NEGATIVE    Comment: (NOTE) The Xpert Xpress SARS-CoV-2/FLU/RSV plus assay is intended as an aid in the diagnosis of influenza from Nasopharyngeal swab specimens and should not be used as a sole basis for treatment. Nasal washings and aspirates are unacceptable for Xpert Xpress SARS-CoV-2/FLU/RSV testing.  Fact Sheet for Patients: BloggerCourse.com  Fact Sheet for Healthcare  Providers: SeriousBroker.it  This test is not yet approved or cleared by the [place] FDA and has been authorized for detection and/or diagnosis of SARS-CoV-2 by FDA under an Emergency Use Authorization (EUA). This EUA will remain in effect (meaning this test can be used) for the duration of the COVID-19 declaration under Section 564(b)(1) of the Act, 21 U.S.C. section 360bbb-3(b)(1), unless the authorization is terminated or revoked.  Performed at [facility], [address] [place], [place] [address]   Basic metabolic panel     Status: Abnormal   Collection Time: 04/23/21  6:12 AM  Result Value Ref Range   Sodium 136 135 - 145 mmol/L   Potassium 3.9 3.5 - 5.1 mmol/L   Chloride 99 98 - 111 mmol/L   CO2 27 22 - 32 mmol/L   Glucose, Bld 146 (H) 70 - 99 mg/dL    Comment: Glucose reference range applies only to samples taken after fasting for at least 8 hours.   BUN 9 6 - 20 mg/dL   Creatinine, Ser 6.04 0.44 - 1.00 mg/dL   Calcium 8.6 (L) 8.9 - 10.3 mg/dL   GFR, Estimated >54 >09 mL/min    Comment: (NOTE) Calculated using the CKD-EPI Creatinine Equation (2021)    Anion gap 10 5 - 15    Comment: Performed at [facility], [address] [place], [place] [address]  CBC     Status: Abnormal   Collection Time: 04/23/21  6:12 AM  Result Value Ref Range   WBC 16.2 (H) 4.0 - 10.5 K/uL   RBC 4.20 3.87 - 5.11 MIL/uL   Hemoglobin 7.9 (L) 12.0 - 15.0 g/dL    Comment: Reticulocyte Hemoglobin testing may be clinically indicated, consider ordering this additional test [id number]    HCT 27.4 (L) 36.0 -  46.0 %   MCV 65.2 (L) 80.0 - 100.0 fL   MCH 18.8 (L) 26.0 - 34.0 pg   MCHC 28.8 (L) 30.0 - 36.0 g/dL   RDW 16.1 (H) 09.6 - 04.5 %   Platelets 576 (H) 150 - 400 K/uL   nRBC 0.0 0.0 - 0.2 %    Comment: Performed at [facility], [address] [place], [place] [address]  Hepatic function panel     Status: Abnormal    Collection Time: 04/23/21  6:12 AM  Result Value Ref Range   Total Protein 7.3 6.5 - 8.1 g/dL   Albumin 3.8 3.5 - 5.0 g/dL   AST 12 (L) 15 - 41 U/L   ALT 8 0 - 44 U/L   Alkaline Phosphatase 89 38 - 126 U/L   Total Bilirubin 0.5 0.3 - 1.2 mg/dL   Bilirubin, Direct <1.9 0.0 - 0.2 mg/dL   Indirect Bilirubin NOT CALCULATED 0.3 - 0.9 mg/dL    Comment: Performed at [facility], [address] [place], [place] [address]  Procalcitonin     Status: None   Collection Time: 04/23/21  6:12 AM  Result Value Ref Range   Procalcitonin 0.21 ng/mL    Comment:        Interpretation: PCT (Procalcitonin) <= 0.5 ng/mL: Systemic infection (sepsis) is not likely. Local bacterial infection is possible. (NOTE)       Sepsis PCT Algorithm           Lower Respiratory Tract                                      Infection PCT Algorithm    ----------------------------     ----------------------------         PCT < 0.25 ng/mL                PCT < 0.10 ng/mL          Strongly encourage             Strongly discourage   discontinuation of antibiotics    initiation of antibiotics    ----------------------------     -----------------------------       PCT 0.25 - 0.50 ng/mL            PCT 0.10 - 0.25 ng/mL               OR       >80% decrease in PCT            Discourage initiation of                                            antibiotics      Encourage discontinuation           of antibiotics    ----------------------------     -----------------------------         PCT >= 0.50 ng/mL              PCT 0.26 - 0.50 ng/mL               AND        <80% decrease in PCT             Encourage initiation of  antibiotics       Encourage continuation           of antibiotics    ----------------------------     -----------------------------        PCT >= 0.50 ng/mL                  PCT > 0.50 ng/mL               AND         increase in PCT                  Strongly  encourage                                      initiation of antibiotics    Strongly encourage escalation           of antibiotics                                     -----------------------------                                           PCT <= 0.25 ng/mL                                                 OR                                        > 80% decrease in PCT                                      Discontinue / Do not initiate                                             antibiotics  Performed at [facility], [address], [place], [place] [address]   Troponin I (High Sensitivity)     Status: None   Collection Time: 04/23/21  6:12 AM  Result Value Ref Range   Troponin I (High Sensitivity) 9 <18 ng/L    Comment: (NOTE) Elevated high sensitivity troponin I (hsTnI) values and significant  changes across serial measurements may suggest ACS but many other  chronic and acute conditions are known to elevate hsTnI results.  Refer to the "Links" section for chest pain algorithms and additional  guidance. Performed at [facility], [address] [place], [place] [address]     DG Chest 2 View  Result Date: 04/22/2021 CLINICAL DATA:  Chest pain. EXAM: CHEST - 2 VIEW COMPARISON:  January 24, 2020. FINDINGS: The heart size and mediastinal contours are within normal limits. Both lungs are clear. The visualized skeletal structures are unremarkable. IMPRESSION: No active cardiopulmonary disease. Electronically Signed   By: Lupita Raider  M.D.   On: 04/22/2021 14:38   US ABDOMEN LIMITED RUQ (LIVER/GB)  Result Date: 04/22/2021 CLINICAL DATA:  Abdominal pain for 1 month EXAM: ULTRASOUND ABDOMEN LIMITED RIGHT UPPER QUADRANT COMPARISON:  CT chest 01/24/2020. FINDINGS: Gallbladder: 9 mm echogenic, shadowing stone within the gallbladder. Small amount of layering sludge. Mild diffuse gallbladder wall thickening of up to 3.7 mm. Technologist did not indicate whether a sonographic  Murphy's sign was present. Common bile duct: Diameter: 4 mm. Liver: No focal lesion identified. Within normal limits in parenchymal echogenicity. Portal vein is patent on color Doppler imaging with normal direction of blood flow towards the liver. Other: Trace perihepatic free fluid. IMPRESSION: 1. Cholelithiasis with mild gallbladder wall thickening, which may reflect cholecystitis. 2. Trace perihepatic free fluid. Electronically Signed   By: Duanne Guess D.O.   On: 04/22/2021 15:22    Review of Systems  Constitutional:  Positive for fatigue.  Cardiovascular:  Positive for chest pain.  Gastrointestinal:  Positive for abdominal pain.  Neurological:        Residual left hemiplegia from stroke    Today's Vitals   04/22/21 2037 04/23/21 0218 04/23/21 0815 04/23/21 0823  BP:    (!) 156/88  Pulse:    95  Resp:    20  Temp:    98.4 F (36.9 C)  TempSrc:    Oral  SpO2:    93%  Weight:      Height:      PainSc: 10-Worst pain ever Asleep 10-Worst pain ever    Body mass index is 33.07 kg/m.   Physical Exam Constitutional:      General: She is not in acute distress.    Appearance: She is obese.  HENT:     Head: Normocephalic and atraumatic.     Nose:     Comments: Covered with a mask Eyes:     General: No scleral icterus.       Right eye: No discharge.        Left eye: No discharge.     Comments: Roving eye movements consistent with her history of cortical blindness.  Cardiovascular:     Rate and Rhythm: Normal rate and regular rhythm.  Chest:     Comments: Chest pain is reproducible with palpation at the clavicles and sternum. Abdominal:     Palpations: Abdomen is soft.     Tenderness: There is abdominal tenderness. There is no guarding or rebound.     Comments: Tender to palpation in the right upper quadrant and epigastrium.  Murphy sign is negative.  Genitourinary:    Comments: Chronic indwelling Foley catheter.  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 Musculoskeletal:        General: No swelling.     Comments: Left arm contracture  Skin:    General: Skin is warm and dry.     Coloration: Skin is not jaundiced.     Comments: Skin graft on right arm  Neurological:     Mental Status: She is alert and oriented to person, place, and time.     Comments: Left hemiplegia with left arm in contracture.  Psychiatric:        Mood and Affect: Mood normal.        Behavior: Behavior normal.    Assessment/Plan: This is a 60 year old woman with significant and complex medical comorbidities including multiple strokes with residual hemiplegia and cortical blindness, multiple myocardial infarctions with placement of stents; most  recent ST elevation MI was June 2021 and she continues to be on dual antiplatelet therapy.  She has also had DVT and PE in the past.  She presented to the emergency department with chest pain, but upon further evaluation the pain was actually more in her right upper quadrant.  Imaging was suggestive of possible acute cholecystitis.  Patient is a suboptimal surgical candidate.  -- Cardiology consult pending  -- HIDA scan tomorrow.  Patient given a diet with n.p.o. orders after midnight and orders to withhold narcotics after midnight.  --Leukocytosis appears to be chronic and may not actually reflect infection at this time.  --Continue to hold Brilinta in case operative intervention is required.  --Continue IV antibiotics  Duanne Guess 04/23/2021, 3:18 PM

## 2021-04-23 NOTE — Progress Notes (Addendum)
PROGRESS NOTE    Victoria Medina  YBW:[id number] DOB: 05-17-1961 DOA: 04/22/2021 PCP: Karie Schwalbe, MD   Assessment & Plan:   Principal Problem:   Cholecystitis Active Problems:   NSTEMI (non-ST elevated myocardial infarction) (HCC)   Diabetes mellitus without complication (HCC)   Hypertension   Hypothyroidism (acquired)   Anxiety   Generalized anxiety disorder   Mixed hyperlipidemia   Obesity   Leukocytosis  Cholecystitis: full liquid diet today and NPO after midnight including all narcotics including but not limited to po, IM, IV or sublingual. HIDA scan tomorrow. Morphine prn for pain. Continue on IV zosyn. General surg following and recs apprec   Leukocytosis: likely secondary to above. Continue on IV abxs   Thrombocytosis: etiology unclear, likely reactive  Microcytic anemia: will check iron panel    Neurogenic bladder: continue w/ chronic foley    Hx of CVA: hold home dose of aspirin   Anxiety: severity unknown. Continue on valium prn    Hypothyroidism: continue on home dose of levothyroxine    GERD: continue on PPI   Obesity: BMI 33.0. Complicates overall care & prognosis  DVT prophylaxis: heparin SQ Code Status: DNR Family Communication: Disposition Plan: likely back to home facility.  Level of care: Med-Surg  Status is: Observation  The patient remains OBS appropriate and will d/c before 2 midnights.  Dispo: The patient is from:  home facility               Anticipated d/c is to:  home facility               Patient currently is not medically stable to d/c.   Difficult to place patient : unclear   Consultants:  General surg   Procedures:   Antimicrobials: zosyn    Subjective: Pt c/o being thirsty   Objective: Vitals:   04/22/21 1315 04/22/21 1715 04/22/21 1943 04/22/21 2033  BP:  140/70 (!) 141/77 (!) 138/50  Pulse:  81 80 83  Resp:  17 17 18   Temp:  98.8 F (37.1 C) 98.3 F (36.8 C) 98.7 F (37.1 C)  TempSrc:  Oral Oral Oral   SpO2:  98% 98% 99%  Weight: 79.4 kg     Height: 5\' 1"  (1.549 m)       Intake/Output Summary (Last 24 hours) at 04/23/2021 Last data filed at 04/23/2021 0500 Gross per 24 hour  Intake 600 ml  Output 200 ml  Net 400 ml   Filed Weights   04/22/21 1315  Weight: 79.4 kg    Examination:  General exam: Appears agitated & frustrated Respiratory system: Clear to auscultation. Respiratory effort normal. Cardiovascular system: S1 & S2+. No  rubs, gallops or clicks.  Gastrointestinal system: Abdomen is nondistended, soft and nontender.  Normal bowel sounds heard. Central nervous system: Alert and oriented.Moves all extremities  Psychiatry: Judgement and insight appear normal. Agitated and frustrated     Data Reviewed: I have personally reviewed following labs and imaging studies  CBC: Recent Labs  Lab 04/22/21 1334 04/23/21 0612  WBC 16.8* 16.2*  HGB 8.2* 7.9*  HCT 27.3* 27.4*  MCV 64.4* 65.2*  PLT 566* 576*   Basic Metabolic Panel: Recent Labs  Lab 04/22/21 1334  NA 130*  K 3.8  CL 94*  CO2 29  GLUCOSE 115*  BUN 9  CREATININE 0.80  CALCIUM 8.4*   GFR: Estimated Creatinine Clearance: 72.2 mL/min (by C-G formula based on SCr of 0.8 mg/dL). Liver Function Tests: Recent Labs  Lab 04/22/21 1334  AST 10*  ALT 9  ALKPHOS 84  BILITOT 0.6  PROT 7.5  ALBUMIN 3.8   Recent Labs  Lab 04/22/21 1334  LIPASE 34   No results for input(s): AMMONIA in the last 168 hours. Coagulation Profile: No results for input(s): INR, PROTIME in the last 168 hours. Cardiac Enzymes: No results for input(s): CKTOTAL, CKMB, CKMBINDEX, TROPONINI in the last 168 hours. BNP (last 3 results) No results for input(s): PROBNP in the last 8760 hours. HbA1C: No results for input(s): HGBA1C in the last 72 hours. CBG: No results for input(s): GLUCAP in the last 168 hours. Lipid Profile: No results for input(s): CHOL, HDL, LDLCALC, TRIG, CHOLHDL, LDLDIRECT in the last 72 hours. Thyroid  Function Tests: No results for input(s): TSH, T4TOTAL, FREET4, T3FREE, THYROIDAB in the last 72 hours. Anemia Panel: No results for input(s): VITAMINB12, FOLATE, FERRITIN, TIBC, IRON, RETICCTPCT in the last 72 hours. Sepsis Labs: No results for input(s): PROCALCITON, LATICACIDVEN in the last 168 hours.  Recent Results (from the past 240 hour(s))  Resp Panel by RT-PCR (Flu A&B, Covid) Nasopharyngeal Swab     Status: None   Collection Time: 04/22/21  2:28 PM   Specimen: Nasopharyngeal Swab; Nasopharyngeal(NP) swabs in vial transport medium  Result Value Ref Range Status   SARS Coronavirus 2 by RT PCR NEGATIVE NEGATIVE Final    Comment: (NOTE) SARS-CoV-2 target nucleic acids are NOT DETECTED.  The SARS-CoV-2 RNA is generally detectable in upper respiratory specimens during the acute phase of infection. The lowest concentration of SARS-CoV-2 viral copies this assay can detect is 138 copies/mL. A negative result does not preclude SARS-Cov-2 infection and should not be used as the sole basis for treatment or other patient management decisions. A negative result may occur with  improper specimen collection/handling, submission of specimen other than nasopharyngeal swab, presence of viral mutation(s) within the areas targeted by this assay, and inadequate number of viral copies(<138 copies/mL). A negative result must be combined with clinical observations, patient history, and epidemiological information. The expected result is Negative.  Fact Sheet for Patients:  BloggerCourse.com  Fact Sheet for Healthcare Providers:  SeriousBroker.it  This test is no t yet approved or cleared by the [place] FDA and  has been authorized for detection and/or diagnosis of SARS-CoV-2 by FDA under an Emergency Use Authorization (EUA). This EUA will remain  in effect (meaning this test can be used) for the duration of the COVID-19 declaration under  Section 564(b)(1) of the Act, 21 U.S.C.section 360bbb-3(b)(1), unless the authorization is terminated  or revoked sooner.       Influenza A by PCR NEGATIVE NEGATIVE Final   Influenza B by PCR NEGATIVE NEGATIVE Final    Comment: (NOTE) The Xpert Xpress SARS-CoV-2/FLU/RSV plus assay is intended as an aid in the diagnosis of influenza from Nasopharyngeal swab specimens and should not be used as a sole basis for treatment. Nasal washings and aspirates are unacceptable for Xpert Xpress SARS-CoV-2/FLU/RSV testing.  Fact Sheet for Patients: BloggerCourse.com  Fact Sheet for Healthcare Providers: SeriousBroker.it  This test is not yet approved or cleared by the [place] FDA and has been authorized for detection and/or diagnosis of SARS-CoV-2 by FDA under an Emergency Use Authorization (EUA). This EUA will remain in effect (meaning this test can be used) for the duration of the COVID-19 declaration under Section 564(b)(1) of the Act, 21 U.S.C. section 360bbb-3(b)(1), unless the authorization is terminated or revoked.  Performed at [facility], [address]  [address] [place], [place] [address]          Radiology Studies: DG Chest 2 View  Result Date: 04/22/2021 CLINICAL DATA:  Chest pain. EXAM: CHEST - 2 VIEW COMPARISON:  January 24, 2020. FINDINGS: The heart size and mediastinal contours are within normal limits. Both lungs are clear. The visualized skeletal structures are unremarkable. IMPRESSION: No active cardiopulmonary disease. Electronically Signed   By: Lupita Raider M.D.   On: 04/22/2021 14:38   US ABDOMEN LIMITED RUQ (LIVER/GB)  Result Date: 04/22/2021 CLINICAL DATA:  Abdominal pain for 1 month EXAM: ULTRASOUND ABDOMEN LIMITED RIGHT UPPER QUADRANT COMPARISON:  CT chest 01/24/2020. FINDINGS: Gallbladder: 9 mm echogenic, shadowing stone within the gallbladder. Small amount of layering sludge. Mild diffuse gallbladder  wall thickening of up to 3.7 mm. Technologist did not indicate whether a sonographic Murphy's sign was present. Common bile duct: Diameter: 4 mm. Liver: No focal lesion identified. Within normal limits in parenchymal echogenicity. Portal vein is patent on color Doppler imaging with normal direction of blood flow towards the liver. Other: Trace perihepatic free fluid. IMPRESSION: 1. Cholelithiasis with mild gallbladder wall thickening, which may reflect cholecystitis. 2. Trace perihepatic free fluid. Electronically Signed   By: Duanne Guess D.O.   On: 04/22/2021 15:22        Scheduled Meds:  Chlorhexidine Gluconate Cloth  6 each Topical Daily   clonazePAM  0.5 mg Oral QHS   heparin  5,000 Units Subcutaneous Q8H   levothyroxine  25 mcg Oral QAC breakfast   lidocaine  1 patch Transdermal Q24H   pantoprazole  20 mg Oral Daily   Continuous Infusions:  piperacillin-tazobactam (ZOSYN)  IV Stopped (04/23/21 0150)     LOS: 0 days    Time spent: 32 mins     Charise Killian, MD [facility] Pager [phone]  If 7PM-7AM, please contact night-coverage 04/23/2021, 7:18 AM

## 2021-04-23 NOTE — Progress Notes (Signed)
Pt refused PIV. States she is a very difficult stick, and ultrasound has not been helpful. Pt scheduled for a CVAD tomorrow. Primary RN aware.

## 2021-04-24 ENCOUNTER — Other Ambulatory Visit: Payer: Medicare Other

## 2021-04-24 DIAGNOSIS — E119 Type 2 diabetes mellitus without complications: Secondary | ICD-10-CM

## 2021-04-24 DIAGNOSIS — K819 Cholecystitis, unspecified: Secondary | ICD-10-CM

## 2021-04-24 DIAGNOSIS — D509 Iron deficiency anemia, unspecified: Secondary | ICD-10-CM | POA: Diagnosis not present

## 2021-04-24 DIAGNOSIS — D649 Anemia, unspecified: Secondary | ICD-10-CM

## 2021-04-24 DIAGNOSIS — Z01810 Encounter for preprocedural cardiovascular examination: Secondary | ICD-10-CM

## 2021-04-24 DIAGNOSIS — F411 Generalized anxiety disorder: Secondary | ICD-10-CM

## 2021-04-24 DIAGNOSIS — D72828 Other elevated white blood cell count: Secondary | ICD-10-CM

## 2021-04-24 LAB — IRON AND TIBC
Iron: 9 ug/dL — ABNORMAL LOW (ref 28–170)
Saturation Ratios: 2 % — ABNORMAL LOW (ref 10.4–31.8)
TIBC: 420 ug/dL (ref 250–450)
UIBC: 411 ug/dL

## 2021-04-24 LAB — COMPREHENSIVE METABOLIC PANEL
ALT: 8 U/L (ref 0–44)
AST: 10 U/L — ABNORMAL LOW (ref 15–41)
Albumin: 3.5 g/dL (ref 3.5–5.0)
Alkaline Phosphatase: 84 U/L (ref 38–126)
Anion gap: 8 (ref 5–15)
BUN: 10 mg/dL (ref 6–20)
CO2: 26 mmol/L (ref 22–32)
Calcium: 8.6 mg/dL — ABNORMAL LOW (ref 8.9–10.3)
Chloride: 103 mmol/L (ref 98–111)
Creatinine, Ser: 0.74 mg/dL (ref 0.44–1.00)
GFR, Estimated: >60 mL/min (ref >60)
Glucose, Bld: 118 mg/dL — ABNORMAL HIGH (ref 70–99)
Potassium: 3.7 mmol/L (ref 3.5–5.1)
Sodium: 137 mmol/L (ref 135–145)
Total Bilirubin: 0.6 mg/dL (ref 0.3–1.2)
Total Protein: 7.1 g/dL (ref 6.5–8.1)

## 2021-04-24 LAB — CBC
HCT: 25.4 % — ABNORMAL LOW (ref 36.0–46.0)
Hemoglobin: 7.7 g/dL — ABNORMAL LOW (ref 12.0–15.0)
MCH: 19.6 pg — ABNORMAL LOW (ref 26.0–34.0)
MCHC: 30.3 g/dL (ref 30.0–36.0)
MCV: 64.6 fL — ABNORMAL LOW (ref 80.0–100.0)
Platelets: 555 10*3/uL — ABNORMAL HIGH (ref 150–400)
RBC: 3.93 MIL/uL (ref 3.87–5.11)
RDW: 17.8 % — ABNORMAL HIGH (ref 11.5–15.5)
WBC: 12.4 10*3/uL — ABNORMAL HIGH (ref 4.0–10.5)
nRBC: 0 % (ref 0.0–0.2)

## 2021-04-24 LAB — FERRITIN: Ferritin: 29 ng/mL (ref 11–307)

## 2021-04-24 MED ORDER — AMOXICILLIN-POT CLAVULANATE 875-125 MG PO TABS: 1.0000 | ORAL_TABLET | Freq: Two times a day (BID) | ORAL | Status: DC

## 2021-04-24 MED ORDER — AMOXICILLIN-POT CLAVULANATE 875-125 MG PO TABS
1.0000 | ORAL_TABLET | Freq: Two times a day (BID) | ORAL | Status: DC
  Administered 2021-04-24: 1 via ORAL
  Filled 2021-04-24: qty 1

## 2021-04-24 MED ORDER — FLUTICASONE PROPIONATE 50 MCG/ACT NA SUSP
1.0000 | Freq: Every day | NASAL | Status: DC
  Administered 2021-04-24: 1 via NASAL
  Filled 2021-04-24: qty 16

## 2021-04-24 NOTE — Progress Notes (Signed)
PROGRESS NOTE    Victoria Medina  TML:[id number] DOB: 03-07-1961 DOA: 04/22/2021 PCP: Karie Schwalbe, MD   Assessment & Plan:   Principal Problem:   Cholecystitis Active Problems:   NSTEMI (non-ST elevated myocardial infarction) (HCC)   Diabetes mellitus without complication (HCC)   Hypertension   Hypothyroidism (acquired)   Anxiety   Generalized anxiety disorder   Mixed hyperlipidemia   Obesity   Leukocytosis  Cholecystitis: pt refused ensure & liquid diet yesterday so HIDA scan unable to be done today. Will restart liquid diet today & NPO after midnight including all narcotics including but not limited to po, IM, IV or sublingual. Hopefully, HIDA scan tomorrow. Morphine prn for pain. Continue on IV zosyn. General surg following and recs apprec   Leukocytosis: likely secondary to above. Continue on abxs   Thrombocytosis: etiology unclear. Likely reactive   Microcytic anemia: iron panel is pending. Will transfuse if Hb < 7.0   Neurogenic bladder: continue w/ chronic foley    Hx of CVA: hold home dose of aspirin   Anxiety: severity unknown. Continue on valium prn    Hypothyroidism: continue on home dose of levothyroxine    GERD: continue on PPI   Obesity: BMI 33.0. Complicates overall care & prognosis   DVT prophylaxis: heparin SQ Code Status: DNR Family Communication: Disposition Plan: likely back to home facility.  Level of care: Med-Surg  Status is: Inpatient  Remains inpatient appropriate because:Ongoing diagnostic testing needed not appropriate for outpatient work up, IV treatments appropriate due to intensity of illness or inability to take PO, and Inpatient level of care appropriate due to severity of illness  Dispo: The patient is from:  home facility              Anticipated d/c is to:  home facility               Patient currently is not medically stable to d/c.   Difficult to place patient : unclear    Consultants:  General surg   Procedures:    Antimicrobials: zosyn    Subjective: Pt c/o malaise  Objective: Vitals:   04/23/21 1606 04/23/21 1957 04/24/21 0354 04/24/21 0812  BP: (!) 148/77 (!) 155/78 (!) 144/62 (!) 143/78  Pulse: 92 96 80 83  Resp: 16 18 16 20   Temp: 98.9 F (37.2 C) 98.7 F (37.1 C) 97.9 F (36.6 C) 98.4 F (36.9 C)  TempSrc: Oral Oral Oral Oral  SpO2: 92% 98%  99%  Weight:      Height:        Intake/Output Summary (Last 24 hours) at 04/24/2021 1110 Last data filed at 04/24/2021 0306 Gross per 24 hour  Intake 29.19 ml  Output 800 ml  Net -770.81 ml   Filed Weights   04/22/21 1315  Weight: 79.4 kg    Examination:  General exam: Appears calm but frustrated Respiratory system: Clear breath sounds b/l  Cardiovascular system: S1/S2+. No rubs or clicks  Gastrointestinal system: Abd is soft, NT, obese & hypoactive bowel sounds Central nervous system: alert and oriented. Moves all extremities  Psychiatry: Judgement and insight appears poor. Flat mood and affect     Data Reviewed: I have personally reviewed following labs and imaging studies  CBC: Recent Labs  Lab 04/22/21 1334 04/23/21 0612 04/24/21 0956  WBC 16.8* 16.2* 12.4*  HGB 8.2* 7.9* 7.7*  HCT 27.3* 27.4* 25.4*  MCV 64.4* 65.2* 64.6*  PLT 566* 576* 555*   Basic Metabolic Panel: Recent Labs  Lab 04/22/21 1334 04/23/21 0612 04/24/21 0956  NA 130* 136 137  K 3.8 3.9 3.7  CL 94* 99 103  CO2 29 27 26   GLUCOSE 115* 146* 118*  BUN 9 9 10   CREATININE 0.80 0.74 0.74  CALCIUM 8.4* 8.6* 8.6*   GFR: Estimated Creatinine Clearance: 72.2 mL/min (by C-G formula based on SCr of 0.74 mg/dL). Liver Function Tests: Recent Labs  Lab 04/22/21 1334 04/23/21 0612 04/24/21 0956  AST 10* 12* 10*  ALT 9 8 8   ALKPHOS 84 89 84  BILITOT 0.6 0.5 0.6  PROT 7.5 7.3 7.1  ALBUMIN 3.8 3.8 3.5   Recent Labs  Lab 04/22/21 1334  LIPASE 34   No results for input(s): AMMONIA in the last 168 hours. Coagulation Profile: No results for  input(s): INR, PROTIME in the last 168 hours. Cardiac Enzymes: No results for input(s): CKTOTAL, CKMB, CKMBINDEX, TROPONINI in the last 168 hours. BNP (last 3 results) No results for input(s): PROBNP in the last 8760 hours. HbA1C: No results for input(s): HGBA1C in the last 72 hours. CBG: No results for input(s): GLUCAP in the last 168 hours. Lipid Profile: No results for input(s): CHOL, HDL, LDLCALC, TRIG, CHOLHDL, LDLDIRECT in the last 72 hours. Thyroid Function Tests: No results for input(s): TSH, T4TOTAL, FREET4, T3FREE, THYROIDAB in the last 72 hours. Anemia Panel: Recent Labs    04/24/21 0956  FERRITIN 29  TIBC 420  IRON 9*   Sepsis Labs: Recent Labs  Lab 04/23/21 0612  PROCALCITON 0.21    Recent Results (from the past 240 hour(s))  Resp Panel by RT-PCR (Flu A&B, Covid) Nasopharyngeal Swab     Status: None   Collection Time: 04/22/21  2:28 PM   Specimen: Nasopharyngeal Swab; Nasopharyngeal(NP) swabs in vial transport medium  Result Value Ref Range Status   SARS Coronavirus 2 by RT PCR NEGATIVE NEGATIVE Final    Comment: (NOTE) SARS-CoV-2 target nucleic acids are NOT DETECTED.  The SARS-CoV-2 RNA is generally detectable in upper respiratory specimens during the acute phase of infection. The lowest concentration of SARS-CoV-2 viral copies this assay can detect is 138 copies/mL. A negative result does not preclude SARS-Cov-2 infection and should not be used as the sole basis for treatment or other patient management decisions. A negative result may occur with  improper specimen collection/handling, submission of specimen other than nasopharyngeal swab, presence of viral mutation(s) within the areas targeted by this assay, and inadequate number of viral copies(<138 copies/mL). A negative result must be combined with clinical observations, patient history, and epidemiological information. The expected result is Negative.  Fact Sheet for Patients:   04/26/21  Fact Sheet for Healthcare Providers:  04/25/21  This test is no t yet approved or cleared by the 04/24/21 FDA and  has been authorized for detection and/or diagnosis of SARS-CoV-2 by FDA under an Emergency Use Authorization (EUA). This EUA will remain  in effect (meaning this test can be used) for the duration of the COVID-19 declaration under Section 564(b)(1) of the Act, 21 U.S.C.section 360bbb-3(b)(1), unless the authorization is terminated  or revoked sooner.       Influenza A by PCR NEGATIVE NEGATIVE Final   Influenza B by PCR NEGATIVE NEGATIVE Final    Comment: (NOTE) The Xpert Xpress SARS-CoV-2/FLU/RSV plus assay is intended as an aid in the diagnosis of influenza from Nasopharyngeal swab specimens and should not be used as a sole basis for treatment. Nasal washings and aspirates are unacceptable for Xpert Xpress SARS-CoV-2/FLU/RSV testing.  Fact Sheet for Patients: BloggerCourse.com  Fact Sheet for Healthcare Providers: SeriousBroker.it  This test is not yet approved or cleared by the [place] FDA and has been authorized for detection and/or diagnosis of SARS-CoV-2 by FDA under an Emergency Use Authorization (EUA). This EUA will remain in effect (meaning this test can be used) for the duration of the COVID-19 declaration under Section 564(b)(1) of the Act, 21 U.S.C. section 360bbb-3(b)(1), unless the authorization is terminated or revoked.  Performed at [facility], [address] [place], [place] [address]   Culture, blood (Routine X 2) w Reflex to ID Panel     Status: None (Preliminary result)   Collection Time: 04/23/21  6:12 AM   Specimen: BLOOD  Result Value Ref Range Status   Specimen Description BLOOD BLOOD RIGHT HAND  Final   Special Requests   Final    BOTTLES DRAWN AEROBIC AND ANAEROBIC Blood Culture  adequate volume   Culture   Final    NO GROWTH < 24 HOURS Performed at [facility], [address], [place], [place] [address]    Report Status PENDING  Incomplete  Culture, blood (Routine X 2) w Reflex to ID Panel     Status: None (Preliminary result)   Collection Time: 04/23/21  3:31 PM   Specimen: BLOOD  Result Value Ref Range Status   Specimen Description BLOOD RIGHT HAND  Final   Special Requests   Final    BOTTLES DRAWN AEROBIC AND ANAEROBIC Blood Culture adequate volume   Culture   Final    NO GROWTH < 24 HOURS Performed at [facility], [address] [place], [place] [address]    Report Status PENDING  Incomplete         Radiology Studies: DG Chest 2 View  Result Date: 04/22/2021 CLINICAL DATA:  Chest pain. EXAM: CHEST - 2 VIEW COMPARISON:  January 24, 2020. FINDINGS: The heart size and mediastinal contours are within normal limits. Both lungs are clear. The visualized skeletal structures are unremarkable. IMPRESSION: No active cardiopulmonary disease. Electronically Signed   By: Lupita Raider M.D.   On: 04/22/2021 14:38   US ABDOMEN LIMITED RUQ (LIVER/GB)  Result Date: 04/22/2021 CLINICAL DATA:  Abdominal pain for 1 month EXAM: ULTRASOUND ABDOMEN LIMITED RIGHT UPPER QUADRANT COMPARISON:  CT chest 01/24/2020. FINDINGS: Gallbladder: 9 mm echogenic, shadowing stone within the gallbladder. Small amount of layering sludge. Mild diffuse gallbladder wall thickening of up to 3.7 mm. Technologist did not indicate whether a sonographic Murphy's sign was present. Common bile duct: Diameter: 4 mm. Liver: No focal lesion identified. Within normal limits in parenchymal echogenicity. Portal vein is patent on color Doppler imaging with normal direction of blood flow towards the liver. Other: Trace perihepatic free fluid. IMPRESSION: 1. Cholelithiasis with mild gallbladder wall thickening, which may reflect cholecystitis. 2. Trace perihepatic free fluid. Electronically  Signed   By: Duanne Guess D.O.   On: 04/22/2021 15:22        Scheduled Meds:  Chlorhexidine Gluconate Cloth  6 each Topical Daily   clonazePAM  0.5 mg Oral QHS   feeding supplement  237 mL Oral TID   levothyroxine  25 mcg Oral QAC breakfast   lidocaine  1 patch Transdermal Q24H   pantoprazole  20 mg Oral Daily   Continuous Infusions:  piperacillin-tazobactam (ZOSYN)  IV Stopped (04/23/21 1858)     LOS: 1 day    Time spent: 34 mins     Charise Killian, MD  [facility] Pager [phone]  If 7PM-7AM, please contact night-coverage 04/24/2021, 11:10 AM

## 2021-04-24 NOTE — Progress Notes (Signed)
Pt. Refuse some meds and patient care. Notify the MD that pt. Wants to leave. Per MD medically unstable, explained to patient. Will continue to follow up.

## 2021-04-24 NOTE — Progress Notes (Addendum)
Patient has a black rope type necklace that was given to security. 2 security guards and one nurse was able to sign off on the valuable. Victoria Medina #[id number] is assigned to this patient. Upon discharge, patient will receive belongings again.

## 2021-04-24 NOTE — Progress Notes (Addendum)
Patient upon assessment does not have an IV in place. She has an order for IV zosyn to be given. I offered this medication however she states people had to attempt for long period of time to get previous IV and it did not stay in. She states it "fell out". I asked patient if she wanted to get another iv for her Zosyn but she states no she does not and she wants an antibiotic given by mouth. Will continue to monitor patient.  Dr. Arville Care placed orders for PO antibiotics for now.   Patient is refusing to do surgery has been eating ice chips tonight.

## 2021-04-24 NOTE — Consult Note (Signed)
Reason for Consult: Right upper quadrant pain, possible acute cholecystitis Referring Physician: Antoine Primas, MD (emergency medicine)  Victoria Medina is an 60 y.o. female.  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  She resides at [facility] and came to the emergency department today with chest pain.  She actually localizes the pain to her epigastrium and right upper quadrant.  She was evaluated in the emergency department and found to have an elevated white blood cell count at 16.8K, as well as a right upper quadrant ultrasound that demonstrated mild gallbladder wall thickening, cholelithiasis and layering sludge.  Normal common bile duct.  The radiologist interpreted this as cholelithiasis with mild gallbladder wall thickening which may reflect cholecystitis.  The technologist did not indicate whether a sonographic Eulah Pont sign was present.  General surgery has been consulted in this context for further evaluation and management.  The patient reports that she has had this pain for over a month.  She says that the staff at her living facility told her that she just was constipated and needed to have a bowel movement.  She does have chronic constipation due to her history of stroke and some of the medications she takes.  The pain does not seem to be exacerbated by eating and nothing specific seems to alleviate it.  She says that narcotic pain medications do not work and that she gets itchy if she takes them without Benadryl; she is able to tolerate them if she takes Benadryl at the same time, however.  She denies any nausea or vomiting.  No subjective fevers or chills.  She is not aware of having had prior issues with her gallbladder, nor was she  aware that she had gallstones.  Interval History 04/24/20: Patient refused to eat and therefore the HIDA scan could not be performed.  She lost her IV access overnight and has refused to allow anyone to be placed order to have a PICC line placed.  She also states that she refuses to have the HIDA scan and does not want any surgery.  She states that her abdominal pain is gone and that she wants to leave.  She also had a number of vague complaints about the hospital and how she believes she has been treated, as well as her various care providers, including myself.  She could not further clarify what specific issues were bothering her, only that she wanted to leave and would refuse any surgery, test, or other intervention.  Past Medical History:  Diagnosis Date   Blind    Diabetes mellitus without complication (HCC)    Hyperlipemia    Hypertension    Stroke [facility])    Patient Active Problem List   Diagnosis Date Noted   Cholecystitis 04/22/2021   Leukocytosis 04/22/2021   Acute ST elevation myocardial infarction (STEMI) of inferolateral wall (HCC) 01/25/2020   NSTEMI (non-ST elevated myocardial infarction) (HCC) 01/24/2020   Stroke (HCC)    Diabetes mellitus without complication (HCC)    Hypertension    Blind    Hypothyroidism (acquired)    Anxiety    GERD (gastroesophageal reflux disease) 12/18/2014   Diabetes mellitus type 2 in obese (HCC) 01/22/2014   Left knee pain 08/22/2013   Impingement syndrome of right shoulder 07/07/2012   Right shoulder pain 07/07/2012  Obesity 06/04/2012   Palpitations 06/04/2012   DVT (deep venous thrombosis) (HCC) 04/03/2012   PE (pulmonary thromboembolism) (HCC) 04/02/2012   Cervical intraepithelial glandular neoplasia 02/07/2011   Generalized anxiety disorder 02/07/2011   Insomnia 02/07/2011   Intractable chronic migraine without aura 02/07/2011   Mixed hyperlipidemia 02/07/2011   Myalgia and myositis, unspecified 02/07/2011   Obstructive sleep  apnea (adult) (pediatric) 02/07/2011   Syncope and collapse 02/07/2011     Past Surgical History:  Procedure Laterality Date   ABDOMINAL HYSTERECTOMY     BACK SURGERY     CARDIAC CATHETERIZATION     CORONARY/GRAFT ACUTE MI REVASCULARIZATION N/A 01/25/2020   Procedure: Coronary/Graft Acute MI Revascularization;  Surgeon: Iran Ouch, MD;  Location: [facility] INVASIVE CV LAB;  Service: Cardiovascular;  Laterality: N/A;   FRACTURE SURGERY     LEFT HEART CATH AND CORONARY ANGIOGRAPHY N/A 01/25/2020   Procedure: LEFT HEART CATH AND CORONARY ANGIOGRAPHY;  Surgeon: Iran Ouch, MD;  Location: [facility] INVASIVE CV LAB;  Service: Cardiovascular;  Laterality: N/A;   SKIN GRAFT Right     Family History  Problem Relation Age of Onset   Heart attack Mother    Hypertension Mother    Hypercholesterolemia Mother    Diabetes Mother    Stroke Father    Heart attack Father    Hypertension Father    Hypercholesterolemia Father    Diabetes Father    Diabetes Maternal Grandmother    Cancer - Other Maternal Grandmother     Social History:  reports that she has never smoked. She has never used smokeless tobacco. She reports that she does not drink alcohol and does not use drugs.  Allergies:  Allergies  Allergen Reactions   Levaquin [Levofloxacin In D5w] Anaphylaxis and Swelling   Iodinated Diagnostic Agents Itching    Severe itching   Latex Dermatitis    Medications: I have reviewed the patient's current medications.  Results for orders placed or performed during the hospital encounter of 04/22/21 (from the past 48 hour(s))  Basic metabolic panel     Status: Abnormal   Collection Time: 04/23/21  6:12 AM  Result Value Ref Range   Sodium 136 135 - 145 mmol/L   Potassium 3.9 3.5 - 5.1 mmol/L   Chloride 99 98 - 111 mmol/L   CO2 27 22 - 32 mmol/L   Glucose, Bld 146 (H) 70 - 99 mg/dL    Comment: Glucose reference range applies only to samples taken after fasting for at least 8 hours.   BUN  9 6 - 20 mg/dL   Creatinine, Ser 1.61 0.44 - 1.00 mg/dL   Calcium 8.6 (L) 8.9 - 10.3 mg/dL   GFR, Estimated >09 >60 mL/min    Comment: (NOTE) Calculated using the CKD-EPI Creatinine Equation (2021)    Anion gap 10 5 - 15    Comment: Performed at [facility], [address] [place], [place] [address]  CBC     Status: Abnormal   Collection Time: 04/23/21  6:12 AM  Result Value Ref Range   WBC 16.2 (H) 4.0 - 10.5 K/uL   RBC 4.20 3.87 - 5.11 MIL/uL   Hemoglobin 7.9 (L) 12.0 - 15.0 g/dL    Comment: Reticulocyte Hemoglobin testing may be clinically indicated, consider ordering this additional test [id number]    HCT 27.4 (L) 36.0 - 46.0 %   MCV 65.2 (L) 80.0 - 100.0 fL   MCH 18.8 (L) 26.0 - 34.0 pg   MCHC 28.8 (L) 30.0 -  36.0 g/dL   RDW 16.1 (H) 09.6 - 04.5 %   Platelets 576 (H) 150 - 400 K/uL   nRBC 0.0 0.0 - 0.2 %    Comment: Performed at [facility], [address] [place], [place] [address]  Hepatic function panel     Status: Abnormal   Collection Time: 04/23/21  6:12 AM  Result Value Ref Range   Total Protein 7.3 6.5 - 8.1 g/dL   Albumin 3.8 3.5 - 5.0 g/dL   AST 12 (L) 15 - 41 U/L   ALT 8 0 - 44 U/L   Alkaline Phosphatase 89 38 - 126 U/L   Total Bilirubin 0.5 0.3 - 1.2 mg/dL   Bilirubin, Direct <1.9 0.0 - 0.2 mg/dL   Indirect Bilirubin NOT CALCULATED 0.3 - 0.9 mg/dL    Comment: Performed at [facility], [address] [place], [place] [address]  Procalcitonin     Status: None   Collection Time: 04/23/21  6:12 AM  Result Value Ref Range   Procalcitonin 0.21 ng/mL    Comment:        Interpretation: PCT (Procalcitonin) <= 0.5 ng/mL: Systemic infection (sepsis) is not likely. Local bacterial infection is possible. (NOTE)       Sepsis PCT Algorithm           Lower Respiratory Tract                                      Infection PCT Algorithm    ----------------------------     ----------------------------         PCT < 0.25 ng/mL                 PCT < 0.10 ng/mL          Strongly encourage             Strongly discourage   discontinuation of antibiotics    initiation of antibiotics    ----------------------------     -----------------------------       PCT 0.25 - 0.50 ng/mL            PCT 0.10 - 0.25 ng/mL               OR       >80% decrease in PCT            Discourage initiation of                                            antibiotics      Encourage discontinuation           of antibiotics    ----------------------------     -----------------------------         PCT >= 0.50 ng/mL              PCT 0.26 - 0.50 ng/mL               AND        <80% decrease in PCT             Encourage initiation of  antibiotics       Encourage continuation           of antibiotics    ----------------------------     -----------------------------        PCT >= 0.50 ng/mL                  PCT > 0.50 ng/mL               AND         increase in PCT                  Strongly encourage                                      initiation of antibiotics    Strongly encourage escalation           of antibiotics                                     -----------------------------                                           PCT <= 0.25 ng/mL                                                 OR                                        > 80% decrease in PCT                                      Discontinue / Do not initiate                                             antibiotics  Performed at [facility], [address], [place], [place] [address]   Troponin I (High Sensitivity)     Status: None   Collection Time: 04/23/21  6:12 AM  Result Value Ref Range   Troponin I (High Sensitivity) 9 <18 ng/L    Comment: (NOTE) Elevated high sensitivity troponin I (hsTnI) values and significant  changes across serial measurements may suggest ACS but many other  chronic and acute conditions are known to elevate  hsTnI results.  Refer to the "Links" section for chest pain algorithms and additional  guidance. Performed at [facility], [address] [place], [place] [address]   Culture, blood (Routine X 2) w Reflex to ID Panel     Status: None (Preliminary result)   Collection Time: 04/23/21  6:12 AM   Specimen: BLOOD  Result Value Ref Range   Specimen Description BLOOD BLOOD RIGHT HAND    Special Requests      BOTTLES DRAWN AEROBIC AND ANAEROBIC Blood Culture  adequate volume   Culture      NO GROWTH < 24 HOURS Performed at [facility], [address] [place], [place] [address]    Report Status PENDING   HIV Antibody (routine testing w rflx)     Status: None   Collection Time: 04/23/21  6:12 AM  Result Value Ref Range   HIV Screen 4th Generation wRfx Non Reactive Non Reactive    Comment: Performed at [facility], [address], [place], [place] [address]  Culture, blood (Routine X 2) w Reflex to ID Panel     Status: None (Preliminary result)   Collection Time: 04/23/21  3:31 PM   Specimen: BLOOD  Result Value Ref Range   Specimen Description BLOOD RIGHT HAND    Special Requests      BOTTLES DRAWN AEROBIC AND ANAEROBIC Blood Culture adequate volume   Culture      NO GROWTH < 24 HOURS Performed at [facility], [address] [place], [place] [address]    Report Status PENDING   CBC     Status: Abnormal   Collection Time: 04/24/21  9:56 AM  Result Value Ref Range   WBC 12.4 (H) 4.0 - 10.5 K/uL   RBC 3.93 3.87 - 5.11 MIL/uL   Hemoglobin 7.7 (L) 12.0 - 15.0 g/dL    Comment: Reticulocyte Hemoglobin testing may be clinically indicated, consider ordering this additional test [id number]    HCT 25.4 (L) 36.0 - 46.0 %   MCV 64.6 (L) 80.0 - 100.0 fL   MCH 19.6 (L) 26.0 - 34.0 pg   MCHC 30.3 30.0 - 36.0 g/dL   RDW 10.2 (H) 58.5 - 27.7 %   Platelets 555 (H) 150 - 400 K/uL   nRBC 0.0 0.0 - 0.2 %    Comment: Performed at [facility], [address] [place], [place] [address]  Comprehensive metabolic panel     Status: Abnormal   Collection Time: 04/24/21  9:56 AM  Result Value Ref Range   Sodium 137 135 - 145 mmol/L   Potassium 3.7 3.5 - 5.1 mmol/L   Chloride 103 98 - 111 mmol/L   CO2 26 22 - 32 mmol/L   Glucose, Bld 118 (H) 70 - 99 mg/dL    Comment: Glucose reference range applies only to samples taken after fasting for at least 8 hours.   BUN 10 6 - 20 mg/dL   Creatinine, Ser 5.36 0.44 - 1.00 mg/dL   Calcium 8.6 (L) 8.9 - 10.3 mg/dL   Total Protein 7.1 6.5 - 8.1 g/dL   Albumin 3.5 3.5 - 5.0 g/dL   AST 10 (L) 15 - 41 U/L   ALT 8 0 - 44 U/L   Alkaline Phosphatase 84 38 - 126 U/L   Total Bilirubin 0.6 0.3 - 1.2 mg/dL   GFR, Estimated >14 >43 mL/min    Comment: (NOTE) Calculated using the CKD-EPI Creatinine Equation (2021)    Anion gap 8 5 - 15    Comment: Performed at [facility], [address] [place], [place] [address]  Iron and TIBC     Status: Abnormal   Collection Time: 04/24/21  9:56 AM  Result Value Ref Range   Iron 9 (L) 28 - 170 ug/dL   TIBC 867 619 - 509 ug/dL   Saturation Ratios 2 (L) 10.4 - 31.8 %   UIBC 411 ug/dL    Comment: Performed at [facility], [address] [place],  [place] [address]  Ferritin     Status: None   Collection Time: 04/24/21  9:56 AM  Result Value Ref Range   Ferritin 29 11 - 307 ng/mL    Comment: Performed at [facility], [address] [place], [place] [address]    US ABDOMEN LIMITED RUQ (LIVER/GB)  Result Date: 04/22/2021 CLINICAL DATA:  Abdominal pain for 1 month EXAM: ULTRASOUND ABDOMEN LIMITED RIGHT UPPER QUADRANT COMPARISON:  CT chest 01/24/2020. FINDINGS: Gallbladder: 9 mm echogenic, shadowing stone within the gallbladder. Small amount of layering sludge. Mild diffuse gallbladder wall thickening of up to 3.7 mm. Technologist did not indicate whether a sonographic Murphy's sign was present. Common bile duct: Diameter: 4 mm. Liver:  No focal lesion identified. Within normal limits in parenchymal echogenicity. Portal vein is patent on color Doppler imaging with normal direction of blood flow towards the liver. Other: Trace perihepatic free fluid. IMPRESSION: 1. Cholelithiasis with mild gallbladder wall thickening, which may reflect cholecystitis. 2. Trace perihepatic free fluid. Electronically Signed   By: Duanne Guess D.O.   On: 04/22/2021 15:22    Review of Systems  Constitutional:  Negative for fatigue.  Cardiovascular:  Negative for chest pain.  Gastrointestinal:  Negative for abdominal pain.  Neurological:        Residual left hemiplegia from stroke    Today's Vitals   04/23/21 1930 04/23/21 1957 04/24/21 0354 04/24/21 0812  BP:  (!) 155/78 (!) 144/62 (!) 143/78  Pulse:  96 80 83  Resp:  18 16 20   Temp:  98.7 F (37.1 C) 97.9 F (36.6 C) 98.4 F (36.9 C)  TempSrc:  Oral Oral Oral  SpO2:  98%  99%  Weight:      Height:      PainSc: 0-No pain      Body mass index is 33.07 kg/m.   Physical Exam Constitutional:      General: She is not in acute distress.    Appearance: She is obese.  HENT:     Head: Normocephalic and atraumatic.     Nose: Nose normal.  Eyes:     General: No scleral icterus.       Right eye: No discharge.        Left eye: No discharge.     Comments: Roving eye movements consistent with her history of cortical blindness.  Cardiovascular:     Rate and Rhythm: Normal rate.  Abdominal:     Palpations: Abdomen is soft.     Tenderness: There is abdominal tenderness. There is no guarding or rebound.     Comments: Patient refused abdominal exam.  Genitourinary:    Comments: Chronic indwelling Foley catheter.  Patient states that she is seeking to get a suprapubic catheter secondary to urethral breakdown. Musculoskeletal:        General: No swelling.     Comments: Left arm contracture  Skin:    General: Skin is warm and dry.     Coloration: Skin is not jaundiced.     Comments:  Skin graft on right arm  Neurological:     Mental Status: She is alert.     Comments: Left hemiplegia with left arm in contracture.  Psychiatric:     Comments: She is somewhat belligerent and agitated.    Assessment/Plan: This is a 60 year old woman with significant and complex medical comorbidities including multiple strokes with residual hemiplegia and cortical blindness, multiple myocardial infarctions with placement of stents; most recent ST elevation MI was June 2021 and  she continues to be on dual antiplatelet therapy.  She has also had DVT and PE in the past.  She presented to the emergency department with chest pain, but upon further evaluation the pain was actually more in her right upper quadrant.  Imaging was suggestive of possible acute cholecystitis.  Patient is a suboptimal surgical candidate.  -- Cardiology consult completed; okay to hold Brilinta for 5 days prior to any surgical intervention, however the patient is currently refusing all tests, studies, or surgery.  -- Unable to obtain HIDA scan secondary to patient noncompliance with instructions.  --Leukocytosis appears to be chronic and may not actually reflect infection at this time, although it is slightly decreased today  --Continue to hold Brilinta in case operative intervention is required and patient has a change of heart regarding undergoing any treatment at this hospital  --Continue IV antibiotics while patient is in-house.   -- If patient remains in the hospital and does not leave, we will continue to follow.  Duanne Guess 04/24/2021, 2:35 PM

## 2021-04-24 NOTE — Progress Notes (Signed)
Nuclear Medicine notified nurse of the need to reattempt to scan the patient. Patient's scan cannot be done due to not eating a meal. Patient was offered ensure three times before midnight without success. Patient is now resting and is still refusing care. No IV acces is available at this time. Patient refused lab draw as well. Patient is educated on the importance of compliance with her plan of care as it is beneficial to her full recovery. After verbal education is given to the patient by this nurse, the patient is repositioned in bed to her comofort, call light is given to the patient and the bed is in the lowest position. No acute distress is noted.

## 2021-04-24 NOTE — TOC Progression Note (Addendum)
Transition of Care [facility]) - Progression Note    Patient Details  Name: Victoria Medina MRN: [id number] Date of Birth: [birth year]  [facility] CM/SW Contact  Bing Quarry, RN Phone Number: 04/24/2021, 5:06 PM  Clinical Narrative:  Received a message from RN taking care of patient. Patient is wanting to sign out AMA, despite surgical consults and her condition. Pain has receded, per consult note, and patient wants to leave AMA, and also has complaints about her care. Patient has refused to take the oral ensure for the HIDA scan at appropriate time, and now refuses test, IV, PICC, and other interventions discussed by provider/surgical consults. Provider does not want to discharge per Unit RN.   RN CM called [facility] and spoke to [employer] and RN that usually take care of patient at [phone]. RN stated that patient is unusually catered to and her behavior is not a surprise. "That is Toshia". Noted that sister Tawni Carnes, listed as responsible party, had been called by patient just before RN CM called. Patient wanted sister to arrange transportation away from [facility]. Mother is also at [facility]   RN CM explained/discussed situation/options with [facility], and whether facility could/would take patient back if she signed out AMA, or stayed and was discharged, providing provider feels that is safe, and transport can be arranged back to [facility]. Facility will take her back tomorrow as she has a contracted bed. IF she signs out AMA, they would try to arrange transportation but were unsure how today. RN would notify supervisor or potentialities.   Relayed all this to Unit RN. Unit RN explained to patient what AMA would mean in terms of hospital not being able to assist patient as it would with proper discharge. Patient wanted RN to sign paper. Patient stated she understood AMA. RN also explained if she waited and provider felt discharge was safe/appropriate, [facility] can arrange safe  transport back to facility. Surgical consult note of today indicated surgical options being evaluated/explored and also noted her complaints/refusals. Attending notified of this progress note.  Gabriel Cirri RN CM          Expected Discharge Plan and Services                                                 Social Determinants of Health (SDOH) Interventions    Readmission Risk Interventions No flowsheet data found.

## 2021-04-24 NOTE — Progress Notes (Signed)
Pt is refusing to take P.O. ensure. Pt is now N.P.O for HIDA scan. No IV access due to patient removing the line on previous shift.

## 2021-04-24 NOTE — Consult Note (Signed)
Cardiology Consultation:   Patient ID: Victoria Medina MRN: [id number]; DOB: 11/16/1960  Admit date: 04/22/2021 Date of Consult: 04/24/2021  PCP:  Victoria Schwalbe, MD   [facility] Providers Cardiologist:  Victoria Bears, MD   { Physician requesting consult: Dr. Lady Medina Reason for consult: Preop cardiovascular evaluation, chest pain, known coronary disease, multiple stents, management of antiplatelet therapy   Patient Profile:   Victoria Medina is a 60 y.o. 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, cortical blindness, presenting from [facility] Pain is underneath right breast, constant, nothing making it better, pain worse with breathing and pushing on it Evaluated by surgical team, noted to have elevated white blood cell count 16.8, localized pain in her epigastrium, right upper quadrant Right upper quadrant ultrasound demonstrating mild gallbladder wall thickening, cholelithiasis, layering sludge Data concerning for cholecystitis Patient indicates pain for over 1 month Narcotics do not seem to alleviate her discomfort She has been unable to have her HIDA scan as has not eaten for several days, refusing to eat when needed for the scan She has no IV access, lost her IV access last night She does report episode left side chest pain yesterday.  States that she needed a nitroglycerin.  Review of medications indicates she did not receive nitro.  She has received Dilaudid,  Valium, Klonopin, PPI, antibiotics  Past Medical History:  Diagnosis Date   Blind    Diabetes mellitus without complication (HCC)    Hyperlipemia    Hypertension    Stroke [facility])     Past Surgical History:  Procedure Laterality Date   ABDOMINAL HYSTERECTOMY     BACK SURGERY     CARDIAC CATHETERIZATION     CORONARY/GRAFT ACUTE MI REVASCULARIZATION N/A 01/25/2020   Procedure: Coronary/Graft Acute MI Revascularization;  Surgeon: Victoria Ouch, MD;  Location: [facility] INVASIVE CV LAB;  Service: Cardiovascular;  Laterality: N/A;   FRACTURE SURGERY     LEFT HEART CATH AND CORONARY ANGIOGRAPHY N/A 01/25/2020   Procedure: LEFT HEART CATH AND CORONARY ANGIOGRAPHY;  Surgeon: Victoria Ouch, MD;  Location: [facility] INVASIVE CV LAB;  Service: Cardiovascular;  Laterality: N/A;   SKIN GRAFT Right      Home Medications:  Prior to Admission medications   Medication Sig Start Date End Date Taking? Authorizing Provider  aspirin EC 81 MG tablet Take 81 mg by mouth daily. Swallow whole.   Yes [provider]  Azelastine HCl 0.15 % SOLN Place 2 sprays into both nostrils 2 (two) times daily. 01/14/20  Yes [provider]  bisacodyl (DULCOLAX) 10 MG suppository Place 10 mg rectally as needed for moderate constipation.   Yes [provider]  clonazePAM (KLONOPIN) 0.5 MG tablet Take 0.5 mg by mouth at bedtime.   Yes [provider]  ibuprofen (ADVIL) 800 MG tablet Take 1 tablet by mouth every 8 (eight) hours as needed.   Yes [provider]  lansoprazole (PREVACID) 15 MG capsule Take 15 mg by mouth daily at 12 noon.    Yes [provider]  levothyroxine (SYNTHROID) 25 MCG tablet Take 25 mcg by mouth daily before breakfast.   Yes [provider]  magnesium citrate SOLN Take 5 mLs by mouth as needed for severe constipation.   Yes [provider]  nitroGLYCERIN (NITROSTAT) 0.4 MG SL tablet Place 0.4 mg under the tongue every 5 (five) minutes x  3 doses as needed for chest pain.    Yes [provider]  nystatin-triamcinolone ointment (MYCOLOG) Apply 1 application topically 2 (two) times daily. 01/21/21  Yes McGowan, Carollee Herter A, Medina  ticagrelor (BRILINTA) 60 MG TABS tablet Take 1 tablet by mouth 2 (two) times daily.   Yes [provider]  tiZANidine (ZANAFLEX) 2 MG tablet Take 2 mg by mouth 3 (three) times daily as needed for muscle spasms.   Yes [provider]  Vitamin D, Ergocalciferol, (DRISDOL) 50000 units CAPS capsule Take 50,000 Units by mouth every Monday.   Yes [provider]  conjugated estrogens (PREMARIN) vaginal cream Apply 0.5mg  (pea-sized amount)  just inside the vaginal introitus with a finger-tip on  Monday, Wednesday and Friday nights. Patient not taking: No sig reported 07/21/20   Michiel Cowboy A, Medina  losartan (COZAAR) 25 MG tablet Take 1 tablet (25 mg total) by mouth daily. Patient not taking: No sig reported 01/28/20   Lurene Shadow, MD  metFORMIN (GLUCOPHAGE) 500 MG tablet Take 500 mg by mouth 2 (two) times daily. Patient not taking: Reported on 04/22/2021    [provider]  mirabegron ER (MYRBETRIQ) 25 MG TB24 tablet Take 1 tablet (25 mg total) by mouth daily. Patient not taking: No sig reported 05/17/20   Carman Ching, Medina  sennosides-docusate sodium (SENOKOT-S) 8.6-50 MG tablet Take 1 tablet by mouth in the morning and at bedtime. Patient not taking: No sig reported    [provider]  trospium (SANCTURA) 20 MG tablet Take 1 tablet (20 mg total) by mouth 2 (two) times daily. Patient not taking: No sig reported 04/01/21   Michiel Cowboy A, Medina  vitamin B-12 (CYANOCOBALAMIN) 1000 MCG tablet Take 1,000 mcg by mouth daily. Patient not taking: Reported on 04/22/2021    [provider]    Inpatient Medications: Scheduled Meds:  Chlorhexidine Gluconate Cloth  6 each Topical Daily   clonazePAM  0.5 mg Oral QHS   feeding supplement  237 mL  Oral TID   levothyroxine  25 mcg Oral QAC breakfast   lidocaine  1 patch Transdermal Q24H   pantoprazole  20 mg Oral Daily   Continuous Infusions:  piperacillin-tazobactam (ZOSYN)  IV Stopped (04/23/21 1858)   PRN Meds: diazepam, ibuprofen, morphine injection, nitroGLYCERIN, ondansetron **OR** ondansetron (ZOFRAN) IV, technetium TC 34M mebrofenin, tiZANidine  Allergies:    Allergies  Allergen Reactions   Levaquin [Levofloxacin In D5w] Anaphylaxis and Swelling   Iodinated Diagnostic Agents Itching    Severe itching   Latex Dermatitis    Social History:   Social History   Socioeconomic History   Marital status: Single    Spouse name: Not on file   Number of children: Not on file   Years of education: Not on file   Highest education level: Not on file  Occupational History   Not on file  Tobacco Use   Smoking status: Never   Smokeless tobacco: Never  Vaping Use   Vaping Use: Never used  Substance and Sexual Activity   Alcohol use: No   Drug use: No   Sexual activity: Not on file  Other Topics  Concern   Not on file  Social History Narrative   Not on file   Social Determinants of Health   Financial Resource Strain: Not on file  Food Insecurity: Not on file  Transportation Needs: Not on file  Physical Activity: Not on file  Stress: Not on file  Social Connections: Not on file  Intimate Partner Violence: Not on file    Family History:    Family History  Problem Relation Age of Onset   Heart attack Mother    Hypertension Mother    Hypercholesterolemia Mother    Diabetes Mother    Stroke Father    Heart attack Father    Hypertension Father    Hypercholesterolemia Father    Diabetes Father    Diabetes Maternal Grandmother    Cancer - Other Maternal Grandmother      ROS:  Please see the history of present illness.  Review of Systems  Constitutional: Negative.   HENT: Negative.    Respiratory: Negative.    Cardiovascular:  Positive for chest pain.   Gastrointestinal:  Positive for abdominal pain.  Musculoskeletal: Negative.   Neurological: Negative.   Psychiatric/Behavioral: Negative.    All other systems reviewed and are negative.   Physical Exam/Data:   Vitals:   04/23/21 1606 04/23/21 1957 04/24/21 0354 04/24/21 0812  BP: (!) 148/77 (!) 155/78 (!) 144/62 (!) 143/78  Pulse: 92 96 80 83  Resp: 16 18 16 20   Temp: 98.9 F (37.2 C) 98.7 F (37.1 C) 97.9 F (36.6 C) 98.4 F (36.9 C)  TempSrc: Oral Oral Oral Oral  SpO2: 92% 98%  99%  Weight:      Height:        Intake/Output Summary (Last 24 hours) at 04/24/2021 1257 Last data filed at 04/24/2021 0306 Gross per 24 hour  Intake --  Output 800 ml  Net -800 ml   Last 3 Weights 04/22/2021 03/23/2021 03/09/2021  Weight (lbs) 175 lb 180 lb (No Data)  Weight (kg) 79.379 kg 81.647 kg (No Data)     Body mass index is 33.07 kg/m.  General:  Well nourished, well developed, in no acute distress HEENT: normal Neck: no JVD Vascular: No carotid bruits; Distal pulses 2+ bilaterally Cardiac:  normal S1, S2; RRR; no murmur Lungs:  clear to auscultation bilaterally, no wheezing, rhonchi or rales  Abd: soft, nontender, no hepatomegaly  Ext: no edema Musculoskeletal:  No deformities, BUE and BLE strength normal and equal Skin: warm and dry  Neuro:  CNs 2-12 intact, no focal abnormalities noted Psych:  Normal affect   EKG:  The EKG was personally reviewed and demonstrates:   Normal sinus rhythm rate 79 bpm nonspecific ST and T wave abnormality  Telemetry:  Telemetry was personally reviewed and demonstrates:   Normal sinus rhythm  Relevant CV Studies:   Laboratory Data:  High Sensitivity Troponin:   Recent Labs  Lab 04/22/21 1334 04/23/21 0612  TROPONINIHS 4 9     Chemistry Recent Labs  Lab 04/22/21 1334 04/23/21 0612 04/24/21 0956  NA 130* 136 137  K 3.8 3.9 3.7  CL 94* 99 103  CO2 29 27 26   GLUCOSE 115* 146* 118*  BUN 9 9 10   CREATININE 0.80 0.74 0.74   CALCIUM 8.4* 8.6* 8.6*  GFRNONAA >60 >60 >60  ANIONGAP 7 10 8     Recent Labs  Lab 04/22/21 1334 04/23/21 0612 04/24/21 0956  PROT 7.5 7.3 7.1  ALBUMIN 3.8 3.8 3.5  AST 10* 12* 10*  ALT 9 8 8   ALKPHOS 84 89 84  BILITOT 0.6 0.5 0.6   Lipids No results for input(s): CHOL, TRIG, HDL, LABVLDL, LDLCALC, CHOLHDL in the last 168 hours.  Hematology Recent Labs  Lab 04/22/21 1334 04/23/21 0612 04/24/21 0956  WBC 16.8* 16.2* 12.4*  RBC 4.24 4.20 3.93  HGB 8.2* 7.9* 7.7*  HCT 27.3* 27.4* 25.4*  MCV 64.4* 65.2* 64.6*  MCH 19.3* 18.8* 19.6*  MCHC 30.0 28.8* 30.3  RDW 17.8* 18.1* 17.8*  PLT 566* 576* 555*   Thyroid No results for input(s): TSH, FREET4 in the last 168 hours.  BNPNo results for input(s): BNP, PROBNP in the last 168 hours.  DDimer  Recent Labs  Lab 04/22/21 1340  DDIMER 1.21*    Radiology/Studies:  DG Chest 2 View  Result Date: 04/22/2021 CLINICAL DATA:  Chest pain. EXAM: CHEST - 2 VIEW COMPARISON:  January 24, 2020. FINDINGS: The heart size and mediastinal contours are within normal limits. Both lungs are clear. The visualized skeletal structures are unremarkable. IMPRESSION: No active cardiopulmonary disease. Electronically Signed   By: January 26, 2020 M.D.   On: 04/22/2021 14:38   04/24/2021 ABDOMEN LIMITED RUQ (LIVER/GB)  Result Date: 04/22/2021 CLINICAL DATA:  Abdominal pain for 1 month EXAM: ULTRASOUND ABDOMEN LIMITED RIGHT UPPER QUADRANT COMPARISON:  CT chest 01/24/2020. FINDINGS: Gallbladder: 9 mm echogenic, shadowing stone within the gallbladder. Small amount of layering sludge. Mild diffuse gallbladder wall thickening of up to 3.7 mm. Technologist did not indicate whether a sonographic Murphy's sign was present. Common bile duct: Diameter: 4 mm. Liver: No focal lesion identified. Within normal limits in parenchymal echogenicity. Portal vein is patent on color Doppler imaging with normal direction of blood flow towards the liver. Other: Trace perihepatic free fluid.  IMPRESSION: 1. Cholelithiasis with mild gallbladder wall thickening, which may reflect cholecystitis. 2. Trace perihepatic free fluid. Electronically Signed   By: 01/26/2020 D.O.   On: 04/22/2021 15:22     Assessment and Plan:   Abdominal pain/cholecystitis Evaluated by surgery, concerning for cholecystitis Initial plan had been for HIDA scan, patient declining at this time On antibiotics If surgery indicated, Brilinta can be held for 5 days, would stay on aspirin At least moderate risk for surgery given severe underlying coronary disease.  She denies unstable angina symptoms.  No further cardiac testing at this time, she is adamant on minimizing any testing, surgeries  2.  Coronary artery disease with stable angina Followed by Dr. 04/24/2021, has been seen in the clinic several times since her STEMI June 2021, stable angina being treated medically She is medication compliant with aspirin and Brilinta 60 twice daily, medications provided by [facility] nursing staff -Describes episode of chest pain requiring nitro yesterday, no documentation of this.  Nitro was not given, she did receive Dilaudid -She is declining any further cardiac work-up No medication changes made  3.  Anemia Low iron History of chronic constipation Given very difficult IV access, iron infusion likely not an option -Would be best treated with iron supplement, stool softeners  4.  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

## 2021-04-25 DIAGNOSIS — D75839 Thrombocytosis, unspecified: Secondary | ICD-10-CM

## 2021-04-25 DIAGNOSIS — D509 Iron deficiency anemia, unspecified: Secondary | ICD-10-CM | POA: Diagnosis not present

## 2021-04-25 DIAGNOSIS — K819 Cholecystitis, unspecified: Secondary | ICD-10-CM | POA: Diagnosis not present

## 2021-04-25 LAB — CBC
HCT: 27.5 % — ABNORMAL LOW (ref 36.0–46.0)
Hemoglobin: 8.2 g/dL — ABNORMAL LOW (ref 12.0–15.0)
MCH: 19.2 pg — ABNORMAL LOW (ref 26.0–34.0)
MCHC: 29.8 g/dL — ABNORMAL LOW (ref 30.0–36.0)
MCV: 64.6 fL — ABNORMAL LOW (ref 80.0–100.0)
Platelets: 568 10*3/uL — ABNORMAL HIGH (ref 150–400)
RBC: 4.26 MIL/uL (ref 3.87–5.11)
RDW: 17.6 % — ABNORMAL HIGH (ref 11.5–15.5)
WBC: 9.6 10*3/uL (ref 4.0–10.5)
nRBC: 0 % (ref 0.0–0.2)

## 2021-04-25 LAB — COMPREHENSIVE METABOLIC PANEL
ALT: 7 U/L (ref 0–44)
AST: 12 U/L — ABNORMAL LOW (ref 15–41)
Albumin: 3.6 g/dL (ref 3.5–5.0)
Alkaline Phosphatase: 82 U/L (ref 38–126)
Anion gap: 10 (ref 5–15)
BUN: 8 mg/dL (ref 6–20)
CO2: 25 mmol/L (ref 22–32)
Calcium: 8.7 mg/dL — ABNORMAL LOW (ref 8.9–10.3)
Chloride: 101 mmol/L (ref 98–111)
Creatinine, Ser: 0.74 mg/dL (ref 0.44–1.00)
GFR, Estimated: >60 mL/min (ref >60)
Glucose, Bld: 103 mg/dL — ABNORMAL HIGH (ref 70–99)
Potassium: 3.4 mmol/L — ABNORMAL LOW (ref 3.5–5.1)
Sodium: 136 mmol/L (ref 135–145)
Total Bilirubin: 0.7 mg/dL (ref 0.3–1.2)
Total Protein: 7.2 g/dL (ref 6.5–8.1)

## 2021-04-25 LAB — RESP PANEL BY RT-PCR (FLU A&B, COVID) ARPGX2
Influenza A by PCR: NEGATIVE
Influenza B by PCR: NEGATIVE
SARS Coronavirus 2 by RT PCR: NEGATIVE

## 2021-04-25 MED ORDER — AMOXICILLIN-POT CLAVULANATE 875-125 MG PO TABS: 1.0000 | ORAL_TABLET | Freq: Two times a day (BID) | ORAL | 0 refills | Status: AC

## 2021-04-25 NOTE — Discharge Summary (Signed)
Physician Discharge Summary  Victoria Medina ZJQ:[id number] DOB: 09/19/1960 DOA: 04/22/2021  PCP: Victoria Schwalbe, MD  Admit date: 04/22/2021 Discharge date: 04/25/2021  Admitted From: Victoria Medina Disposition:  Pt left AMA  Recommendations for Outpatient Follow-up:  Follow up with PCP in 1-2 weeks F/u w/ general surg, Victoria Medina, within 1 week  Home Health: no  Equipment/Devices:  Discharge Condition: Pt left AMA CODE STATUS: full  Diet recommendation: Heart Healthy  Brief/Interim Summary: HPI was taken from Victoria Medina: Victoria Medina is a 60 y.o. female with medical history significant for history of stroke, multiple heart attacks, with cortical blindness, residual left-sided hemiplegia, CAD status post extensive stenting in 2019, history of STEMI in 2021, who presents for abdominal pain.   Victoria Medina is able to tell me her name, age, current location, and current calendar year.    She reports the right upper quadrant abdominal pain, like someone punched her, started a few weeks ago, persistent, not consistent with PO intake or anorexia. The pain would radiate to her right shoulder and right mid back. 10/10. Nothing makes it better. She ednroses associated poor sleep.    She denies fever, chills, chest pain, diarrhea.   She endorses chronic suprapubic pain that has been chronic since having an indwelling catheter   At bedside, patient states that she is in so much pain and that this is no way to live.  She states that she would like to be DNR/DNI at this time.  She would just like her surgery so that her pain will go away.  She understands the risk of OR for the gallbladder removal and that this could result in her death.  She states that she would rather die on the OR table then to continue living with this pain.   Hospital course from Victoria Medina 9/24-9/26/22: Pt presented w/ abd pain which was likely secondary to cholecystitis. Pt was initially put IV zosyn. General surg evaluated the  pt and recommended a HIDA scan but pt refused the HIDA scan and cholecystectomy if that was indicated. Pt requested oral abxs as the pt refused to have another IV placed as the pt's previous IV came out some how. Pt refused to have anymore medical treatment and signed out AMA. I discussed the potential risks with the pt for signing out AMA and pt verbalized her understanding.  Discharge Diagnoses:  Principal Problem:   Cholecystitis Active Problems:   NSTEMI (non-ST elevated myocardial infarction) (HCC)   Diabetes mellitus without complication (HCC)   Hypertension   Hypothyroidism (acquired)   Anxiety   Generalized anxiety disorder   Mixed hyperlipidemia   Obesity   Leukocytosis  Cholecystitis: pt refused HIDA scan and cholecystectomy if indicated. Pt refused to have another IV put in and requested oral abxs. The overnight hospitalist changed the pt's abs to po augmentin  Leukocytosis: resolved   Thrombocytosis: etiology unclear. Likely reactive   IDA: will need iron supplements    Neurogenic bladder: continue w/ chronic foley    Hx of CVA: hold home dose of aspirin while inpatient    Anxiety: severity unknown. Continue on valium prn    Hypothyroidism: continue on home dose of levothyroxine    GERD: continue on PPI   Obesity: BMI 33.0. Complicates overall care & prognosis   Discharge Instructions   Allergies as of 04/25/2021       Reactions   Levaquin [levofloxacin In D5w] Anaphylaxis, Swelling   Iodinated Diagnostic Agents Itching   Severe itching  Latex Dermatitis        Medication List     STOP taking these medications    ibuprofen 800 MG tablet Commonly known as: ADVIL       TAKE these medications    amoxicillin-clavulanate 875-125 MG tablet Commonly known as: AUGMENTIN Take 1 tablet by mouth 2 (two) times daily with a meal for 5 days.   aspirin EC 81 MG tablet Take 81 mg by mouth daily. Swallow whole.   Azelastine HCl 0.15 % Soln Place 2  sprays into both nostrils 2 (two) times daily.   bisacodyl 10 MG suppository Commonly known as: DULCOLAX Place 10 mg rectally as needed for moderate constipation.   clonazePAM 0.5 MG tablet Commonly known as: KLONOPIN Take 0.5 mg by mouth at bedtime.   lansoprazole 15 MG capsule Commonly known as: PREVACID Take 15 mg by mouth daily at 12 noon.   levothyroxine 25 MCG tablet Commonly known as: SYNTHROID Take 25 mcg by mouth daily before breakfast.   losartan 25 MG tablet Commonly known as: COZAAR Take 1 tablet (25 mg total) by mouth daily.   magnesium citrate Soln Take 5 mLs by mouth as needed for severe constipation.   metFORMIN 500 MG tablet Commonly known as: GLUCOPHAGE Take 500 mg by mouth 2 (two) times daily.   mirabegron ER 25 MG Tb24 tablet Commonly known as: Myrbetriq Take 1 tablet (25 mg total) by mouth daily.   nitroGLYCERIN 0.4 MG SL tablet Commonly known as: NITROSTAT Place 0.4 mg under the tongue every 5 (five) minutes x 3 doses as needed for chest pain.   nystatin-triamcinolone ointment Commonly known as: MYCOLOG Apply 1 application topically 2 (two) times daily.   Premarin vaginal cream Generic drug: conjugated estrogens Apply 0.5mg  (pea-sized amount)  just inside the vaginal introitus with a finger-tip on  Monday, Wednesday and Friday nights.   sennosides-docusate sodium 8.6-50 MG tablet Commonly known as: SENOKOT-S Take 1 tablet by mouth in the morning and at bedtime.   ticagrelor 60 MG Tabs tablet Commonly known as: BRILINTA Take 1 tablet by mouth 2 (two) times daily.   tiZANidine 2 MG tablet Commonly known as: ZANAFLEX Take 2 mg by mouth 3 (three) times daily as needed for muscle spasms.   trospium 20 MG tablet Commonly known as: SANCTURA Take 1 tablet (20 mg total) by mouth 2 (two) times daily.   vitamin B-12 1000 MCG tablet Commonly known as: CYANOCOBALAMIN Take 1,000 mcg by mouth daily.   Vitamin D (Ergocalciferol) 1.25 MG (50000  UNIT) Caps capsule Commonly known as: DRISDOL Take 50,000 Units by mouth every Monday.        Allergies  Allergen Reactions   Levaquin [Levofloxacin In D5w] Anaphylaxis and Swelling   Iodinated Diagnostic Agents Itching    Severe itching   Latex Dermatitis    Consultations: General surg    Procedures/Studies: DG Chest 2 View  Result Date: 04/22/2021 CLINICAL DATA:  Chest pain. EXAM: CHEST - 2 VIEW COMPARISON:  January 24, 2020. FINDINGS: The heart size and mediastinal contours are within normal limits. Both lungs are clear. The visualized skeletal structures are unremarkable. IMPRESSION: No active cardiopulmonary disease. Electronically Signed   By: Lupita Raider M.D.   On: 04/22/2021 14:38   US ABDOMEN LIMITED RUQ (LIVER/GB)  Result Date: 04/22/2021 CLINICAL DATA:  Abdominal pain for 1 month EXAM: ULTRASOUND ABDOMEN LIMITED RIGHT UPPER QUADRANT COMPARISON:  CT chest 01/24/2020. FINDINGS: Gallbladder: 9 mm echogenic, shadowing stone within the gallbladder. Small amount of  layering sludge. Mild diffuse gallbladder wall thickening of up to 3.7 mm. Technologist did not indicate whether a sonographic Murphy's sign was present. Common bile duct: Diameter: 4 mm. Liver: No focal lesion identified. Within normal limits in parenchymal echogenicity. Portal vein is patent on color Doppler imaging with normal direction of blood flow towards the liver. Other: Trace perihepatic free fluid. IMPRESSION: 1. Cholelithiasis with mild gallbladder wall thickening, which may reflect cholecystitis. 2. Trace perihepatic free fluid. Electronically Signed   By: Duanne Guess D.O.   On: 04/22/2021 15:22   (Echo, Carotid, EGD, Colonoscopy, ERCP)    Subjective: pt denies any complaints    Discharge Exam: Vitals:   04/25/21 0457 04/25/21 0900  BP: 140/88 (!) 171/86  Pulse: 85 84  Resp: 18 18  Temp: 98.4 F (36.9 C) 98.2 F (36.8 C)  SpO2: 97% 98%   Vitals:   04/24/21 0812 04/24/21 2230 04/25/21  0457 04/25/21 0900  BP: (!) 143/78 (!) 158/85 140/88 (!) 171/86  Pulse: 83 83 85 84  Resp: 20 17 18 18   Temp: 98.4 F (36.9 C) 98.8 F (37.1 C) 98.4 F (36.9 C) 98.2 F (36.8 C)  TempSrc: Oral Oral Oral Oral  SpO2: 99% 97% 97% 98%  Weight:      Height:        General: Pt is alert, awake, not in acute distress Cardiovascular:  S1/S2 +, no rubs, no gallops Respiratory: CTA bilaterally, no wheezing, no rhonchi Abdominal: Soft, NT, obese, bowel sounds + Extremities: no cyanosis    The results of significant diagnostics from this hospitalization (including imaging, microbiology, ancillary and laboratory) are listed below for reference.     Microbiology: Recent Results (from the past 240 hour(s))  Resp Panel by RT-PCR (Flu A&B, Covid) Nasopharyngeal Swab     Status: None   Collection Time: 04/22/21  2:28 PM   Specimen: Nasopharyngeal Swab; Nasopharyngeal(NP) swabs in vial transport medium  Result Value Ref Range Status   SARS Coronavirus 2 by RT PCR NEGATIVE NEGATIVE Final    Comment: (NOTE) SARS-CoV-2 target nucleic acids are NOT DETECTED.  The SARS-CoV-2 RNA is generally detectable in upper respiratory specimens during the acute phase of infection. The lowest concentration of SARS-CoV-2 viral copies this assay can detect is 138 copies/mL. A negative result does not preclude SARS-Cov-2 infection and should not be used as the sole basis for treatment or other patient management decisions. A negative result may occur with  improper specimen collection/handling, submission of specimen other than nasopharyngeal swab, presence of viral mutation(s) within the areas targeted by this assay, and inadequate number of viral copies(<138 copies/mL). A negative result must be combined with clinical observations, patient history, and epidemiological information. The expected result is Negative.  Fact Sheet for Patients:  04/24/21  Fact Sheet for  Healthcare Providers:  BloggerCourse.com  This test is no t yet approved or cleared by the [place] FDA and  has been authorized for detection and/or diagnosis of SARS-CoV-2 by FDA under an Emergency Use Authorization (EUA). This EUA will remain  in effect (meaning this test can be used) for the duration of the COVID-19 declaration under Section 564(b)(1) of the Act, 21 U.S.C.section 360bbb-3(b)(1), unless the authorization is terminated  or revoked sooner.       Influenza A by PCR NEGATIVE NEGATIVE Final   Influenza B by PCR NEGATIVE NEGATIVE Final    Comment: (NOTE) The Xpert Xpress SARS-CoV-2/FLU/RSV plus assay is intended as an aid in the diagnosis of influenza from  Nasopharyngeal swab specimens and should not be used as a sole basis for treatment. Nasal washings and aspirates are unacceptable for Xpert Xpress SARS-CoV-2/FLU/RSV testing.  Fact Sheet for Patients: BloggerCourse.com  Fact Sheet for Healthcare Providers: SeriousBroker.it  This test is not yet approved or cleared by the [place] FDA and has been authorized for detection and/or diagnosis of SARS-CoV-2 by FDA under an Emergency Use Authorization (EUA). This EUA will remain in effect (meaning this test can be used) for the duration of the COVID-19 declaration under Section 564(b)(1) of the Act, 21 U.S.C. section 360bbb-3(b)(1), unless the authorization is terminated or revoked.  Performed at [facility], [address] [place], [place] [address]   Culture, blood (Routine X 2) w Reflex to ID Panel     Status: None (Preliminary result)   Collection Time: 04/23/21  6:12 AM   Specimen: BLOOD  Result Value Ref Range Status   Specimen Description BLOOD BLOOD RIGHT HAND  Final   Special Requests   Final    BOTTLES DRAWN AEROBIC AND ANAEROBIC Blood Culture adequate volume   Culture   Final    NO GROWTH 2 DAYS Performed  at [facility], [address], [place], [place] [address]    Report Status PENDING  Incomplete  Culture, blood (Routine X 2) w Reflex to ID Panel     Status: None (Preliminary result)   Collection Time: 04/23/21  3:31 PM   Specimen: BLOOD  Result Value Ref Range Status   Specimen Description BLOOD RIGHT HAND  Final   Special Requests   Final    BOTTLES DRAWN AEROBIC AND ANAEROBIC Blood Culture adequate volume   Culture   Final    NO GROWTH 2 DAYS Performed at [facility], [address], [place], [place] [address]    Report Status PENDING  Incomplete     Labs: BNP (last 3 results) No results for input(s): BNP in the last 8760 hours. Basic Metabolic Panel: Recent Labs  Lab 04/22/21 1334 04/23/21 0612 04/24/21 0956 04/25/21 0522  NA 130* 136 137 136  K 3.8 3.9 3.7 3.4*  CL 94* 99 103 101  CO2 29 27 26 25   GLUCOSE 115* 146* 118* 103*  BUN 9 9 10 8   CREATININE 0.80 0.74 0.74 0.74  CALCIUM 8.4* 8.6* 8.6* 8.7*   Liver Function Tests: Recent Labs  Lab 04/22/21 1334 04/23/21 0612 04/24/21 0956 04/25/21 0522  AST 10* 12* 10* 12*  ALT 9 8 8 7   ALKPHOS 84 89 84 82  BILITOT 0.6 0.5 0.6 0.7  PROT 7.5 7.3 7.1 7.2  ALBUMIN 3.8 3.8 3.5 3.6   Recent Labs  Lab 04/22/21 1334  LIPASE 34   No results for input(s): AMMONIA in the last 168 hours. CBC: Recent Labs  Lab 04/22/21 1334 04/23/21 0612 04/24/21 0956 04/25/21 0522  WBC 16.8* 16.2* 12.4* 9.6  HGB 8.2* 7.9* 7.7* 8.2*  HCT 27.3* 27.4* 25.4* 27.5*  MCV 64.4* 65.2* 64.6* 64.6*  PLT 566* 576* 555* 568*   Cardiac Enzymes: No results for input(s): CKTOTAL, CKMB, CKMBINDEX, TROPONINI in the last 168 hours. BNP: Invalid input(s): POCBNP CBG: No results for input(s): GLUCAP in the last 168 hours. D-Dimer Recent Labs    04/22/21 1340  DDIMER 1.21*   Hgb A1c No results for input(s): HGBA1C in the last 72 hours. Lipid Profile No results for input(s): CHOL, HDL, LDLCALC, TRIG, CHOLHDL,  LDLDIRECT in the last 72 hours. Thyroid function studies No results for  input(s): TSH, T4TOTAL, T3FREE, THYROIDAB in the last 72 hours.  Invalid input(s): FREET3 Anemia work up Recent Labs    04/24/21 0956  FERRITIN 29  TIBC 420  IRON 9*   Urinalysis    Component Value Date/Time   COLORURINE YELLOW (A) 04/22/2021 1330   APPEARANCEUR HAZY (A) 04/22/2021 1330   APPEARANCEUR Cloudy (A) 05/17/2020 1136   LABSPEC 1.006 04/22/2021 1330   LABSPEC 1.026 08/05/2014 1906   PHURINE 7.0 04/22/2021 1330   GLUCOSEU NEGATIVE 04/22/2021 1330   GLUCOSEU NEGATIVE 08/05/2014 1906   HGBUR SMALL (A) 04/22/2021 1330   BILIRUBINUR NEGATIVE 04/22/2021 1330   BILIRUBINUR Negative 05/17/2020 1136   BILIRUBINUR NEGATIVE 08/05/2014 1906   KETONESUR NEGATIVE 04/22/2021 1330   PROTEINUR NEGATIVE 04/22/2021 1330   NITRITE POSITIVE (A) 04/22/2021 1330   LEUKOCYTESUR LARGE (A) 04/22/2021 1330   LEUKOCYTESUR 2+ 08/05/2014 1906   Sepsis Labs Invalid input(s): PROCALCITONIN,  WBC,  LACTICIDVEN Microbiology Recent Results (from the past 240 hour(s))  Resp Panel by RT-PCR (Flu A&B, Covid) Nasopharyngeal Swab     Status: None   Collection Time: 04/22/21  2:28 PM   Specimen: Nasopharyngeal Swab; Nasopharyngeal(NP) swabs in vial transport medium  Result Value Ref Range Status   SARS Coronavirus 2 by RT PCR NEGATIVE NEGATIVE Final    Comment: (NOTE) SARS-CoV-2 target nucleic acids are NOT DETECTED.  The SARS-CoV-2 RNA is generally detectable in upper respiratory specimens during the acute phase of infection. The lowest concentration of SARS-CoV-2 viral copies this assay can detect is 138 copies/mL. A negative result does not preclude SARS-Cov-2 infection and should not be used as the sole basis for treatment or other patient management decisions. A negative result may occur with  improper specimen collection/handling, submission of specimen other than nasopharyngeal swab, presence of viral mutation(s)  within the areas targeted by this assay, and inadequate number of viral copies(<138 copies/mL). A negative result must be combined with clinical observations, patient history, and epidemiological information. The expected result is Negative.  Fact Sheet for Patients:  BloggerCourse.com  Fact Sheet for Healthcare Providers:  SeriousBroker.it  This test is no t yet approved or cleared by the [place] FDA and  has been authorized for detection and/or diagnosis of SARS-CoV-2 by FDA under an Emergency Use Authorization (EUA). This EUA will remain  in effect (meaning this test can be used) for the duration of the COVID-19 declaration under Section 564(b)(1) of the Act, 21 U.S.C.section 360bbb-3(b)(1), unless the authorization is terminated  or revoked sooner.       Influenza A by PCR NEGATIVE NEGATIVE Final   Influenza B by PCR NEGATIVE NEGATIVE Final    Comment: (NOTE) The Xpert Xpress SARS-CoV-2/FLU/RSV plus assay is intended as an aid in the diagnosis of influenza from Nasopharyngeal swab specimens and should not be used as a sole basis for treatment. Nasal washings and aspirates are unacceptable for Xpert Xpress SARS-CoV-2/FLU/RSV testing.  Fact Sheet for Patients: BloggerCourse.com  Fact Sheet for Healthcare Providers: SeriousBroker.it  This test is not yet approved or cleared by the [place] FDA and has been authorized for detection and/or diagnosis of SARS-CoV-2 by FDA under an Emergency Use Authorization (EUA). This EUA will remain in effect (meaning this test can be used) for the duration of the COVID-19 declaration under Section 564(b)(1) of the Act, 21 U.S.C. section 360bbb-3(b)(1), unless the authorization is terminated or revoked.  Performed at [facility], [address], [place], [place] [address]   Culture, blood (Routine X 2)  w Reflex to ID  Panel     Status: None (Preliminary result)   Collection Time: 04/23/21  6:12 AM   Specimen: BLOOD  Result Value Ref Range Status   Specimen Description BLOOD BLOOD RIGHT HAND  Final   Special Requests   Final    BOTTLES DRAWN AEROBIC AND ANAEROBIC Blood Culture adequate volume   Culture   Final    NO GROWTH 2 DAYS Performed at [facility], [address], [place], [place] [address]    Report Status PENDING  Incomplete  Culture, blood (Routine X 2) w Reflex to ID Panel     Status: None (Preliminary result)   Collection Time: 04/23/21  3:31 PM   Specimen: BLOOD  Result Value Ref Range Status   Specimen Description BLOOD RIGHT HAND  Final   Special Requests   Final    BOTTLES DRAWN AEROBIC AND ANAEROBIC Blood Culture adequate volume   Culture   Final    NO GROWTH 2 DAYS Performed at [facility], [address] [place], [place] [address]    Report Status PENDING  Incomplete     Time coordinating discharge: Over 30 minutes  SIGNED:   Charise Killian, MD  [facility] 04/25/2021, 10:38 AM Pager   If 7PM-7AM, please contact night-coverage

## 2021-04-25 NOTE — NC FL2 (Signed)
[place] MEDICAID FL2 LEVEL OF CARE SCREENING TOOL     IDENTIFICATION  Patient Name: Victoria Medina Birthdate: Jan 11, 1961 Sex: female Admission Date (Current Location): 04/22/2021  [facility] and [place] Number:  Chiropodist and Address:         Provider Number: [id number]  Attending Physician Name and Address:  Charise Killian, MD  Relative Name and Phone Number:       Current Level of Care: SNF Recommended Level of Care: Skilled Nursing Facility Prior Approval Number:    Date Approved/Denied:   PASRR Number: [id number]  Discharge Plan: SNF    Current Diagnoses: Patient Active Problem List   Diagnosis Date Noted   Cholecystitis 04/22/2021   Leukocytosis 04/22/2021   Acute ST elevation myocardial infarction (STEMI) of inferolateral wall (HCC) 01/25/2020   NSTEMI (non-ST elevated myocardial infarction) (HCC) 01/24/2020   Stroke (HCC)    Diabetes mellitus without complication (HCC)    Hypertension    Blind    Hypothyroidism (acquired)    Anxiety    GERD (gastroesophageal reflux disease) 12/18/2014   Diabetes mellitus type 2 in obese (HCC) 01/22/2014   Left knee pain 08/22/2013   Impingement syndrome of right shoulder 07/07/2012   Right shoulder pain 07/07/2012   Obesity 06/04/2012   Palpitations 06/04/2012   DVT (deep venous thrombosis) (HCC) 04/03/2012   PE (pulmonary thromboembolism) (HCC) 04/02/2012   Cervical intraepithelial glandular neoplasia 02/07/2011   Generalized anxiety disorder 02/07/2011   Insomnia 02/07/2011   Intractable chronic migraine without aura 02/07/2011   Mixed hyperlipidemia 02/07/2011   Myalgia and myositis, unspecified 02/07/2011   Obstructive sleep apnea (adult) (pediatric) 02/07/2011   Syncope and collapse 02/07/2011    Orientation RESPIRATION BLADDER Height & Weight     Self, Time, Situation, Place  Normal Indwelling catheter Weight: 79.4 kg Height:  5\' 1"  (154.9 cm)  BEHAVIORAL SYMPTOMS/MOOD NEUROLOGICAL BOWEL  NUTRITION STATUS      Continent Diet (soft)  AMBULATORY STATUS COMMUNICATION OF NEEDS Skin   Total Care Verbally Normal                       Personal Care Assistance Level of Assistance              Functional Limitations Info             SPECIAL CARE FACTORS FREQUENCY                       Contractures Contractures Info: Not present    Additional Factors Info  Code Status, Allergies Code Status Info: DNR Allergies Info: Levaquin (Levofloxacin In D5w), Iodinated Diagnostic Agents, Latex           Current Medications (04/25/2021):  This is the current hospital active medication list Current Facility-Administered Medications  Medication Dose Route Frequency Provider Last Rate Last Admin   amoxicillin-clavulanate (AUGMENTIN) 875-125 MG per tablet 1 tablet  1 tablet Oral BID WC Mansy, Jan A, MD   1 tablet at 04/24/21 2348   Chlorhexidine Gluconate Cloth 2 % PADS 6 each  6 each Topical Daily Cox, Amy N, DO   6 each at 04/23/21 1233   clonazePAM (KLONOPIN) tablet 0.5 mg  0.5 mg Oral QHS Cox, Amy N, DO   0.5 mg at 04/24/21 2212   diazepam (VALIUM) tablet 10 mg  10 mg Oral Q12H PRN 2213, MD   10 mg at 04/24/21 2348   feeding supplement (ENSURE ENLIVE /  ENSURE PLUS) liquid 237 mL  237 mL Oral TID Charise Killian, MD   100 mL at 04/23/21 2217   fluticasone (FLONASE) 50 MCG/ACT nasal spray 1 spray  1 spray Each Nare Daily Charise Killian, MD   1 spray at 04/24/21 1614   levothyroxine (SYNTHROID) tablet 25 mcg  25 mcg Oral QAC breakfast Cox, Amy N, DO   25 mcg at 04/25/21 0629   lidocaine (LIDODERM) 5 % 1 patch  1 patch Transdermal Q24H Cox, Amy N, DO   1 patch at 04/23/21 1724   morphine 4 MG/ML injection 4 mg  4 mg Intravenous Q2H PRN Charise Killian, MD       nitroGLYCERIN (NITROSTAT) SL tablet 0.4 mg  0.4 mg Sublingual Q5 Min x 3 PRN Cox, Amy N, DO       ondansetron (ZOFRAN) tablet 4 mg  4 mg Oral Q6H PRN Cox, Amy N, DO       Or    ondansetron (ZOFRAN) injection 4 mg  4 mg Intravenous Q6H PRN Cox, Amy N, DO       pantoprazole (PROTONIX) EC tablet 20 mg  20 mg Oral Daily Cox, Amy N, DO   20 mg at 04/24/21 1109   technetium TC 70M mebrofenin (CHOLETEC) injection 5 millicurie  5 millicurie Intravenous Once PRN Patel, Hetal P, MD       tiZANidine (ZANAFLEX) tablet 2 mg  2 mg Oral TID PRN Cox, Amy N, DO         Discharge Medications: Please see discharge summary for a list of discharge medications.  Relevant Imaging Results:  Relevant Lab Results:   Additional Information SSN:[id number]  Chapman Fitch, RN

## 2021-04-25 NOTE — Progress Notes (Signed)
Chaplain Maggie visited with patient just before transport came to pick her up to return to [facility]. Room was made for storytelling and listening. Pt identified herself as Emergency planning/management officer. Chaplain affirmed her belief and invited pt that pastoral care does not need to be religious or spiritual and may simply be relating person to person. Pt expressed tears when invited to relate in that manner. The visit was well received and offered space for mutuality. Pt discharging today so there will be no follow up.

## 2021-04-26 DIAGNOSIS — K810 Acute cholecystitis: Secondary | ICD-10-CM

## 2021-04-26 DIAGNOSIS — I25119 Atherosclerotic heart disease of native coronary artery with unspecified angina pectoris: Secondary | ICD-10-CM

## 2021-04-27 ENCOUNTER — Other Ambulatory Visit: Payer: Self-pay

## 2021-04-27 ENCOUNTER — Ambulatory Visit (INDEPENDENT_AMBULATORY_CARE_PROVIDER_SITE_OTHER): Payer: Medicare Other | Admitting: Urology

## 2021-04-27 ENCOUNTER — Encounter: Payer: Self-pay | Admitting: Urology

## 2021-04-27 DIAGNOSIS — Z466 Encounter for fitting and adjustment of urinary device: Secondary | ICD-10-CM

## 2021-04-27 NOTE — Progress Notes (Signed)
Cath Change/ Replacement  Patient is present today for a catheter change due to urinary retention.  8 ml of water was removed from the balloon, a 16 FR foley cath was removed with out difficulty.  Patient was cleaned and prepped in a sterile fashion with betadine. A 16 FR foley cath was replaced into the bladder no complications were noted Urine return was noted 20 ml and urine was yellow in color. The balloon was filled with 81ml of sterile water. A night bag was attached for drainage.  Patient was given proper instruction on catheter care.    Performed by: Michiel Cowboy, PA-C and Lizbeth Bark, CMA  Follow up: one month for Foley cath exchange   While we are allowing the exam table, the gooseneck lamp base got caught underneath the table causing the lamp to tilt.  The lamp of the gooseneck tapped Victoria Medina on the left cheek.  After the incident, no apparent injuries were noted.  I did advise the aide and that SNIF to continue to monitor for bruising.  I also submitted a safety zone event.

## 2021-04-28 LAB — CULTURE, BLOOD (ROUTINE X 2)
Culture: NO GROWTH
Culture: NO GROWTH
Special Requests: ADEQUATE
Special Requests: ADEQUATE

## 2021-05-10 DIAGNOSIS — F419 Anxiety disorder, unspecified: Secondary | ICD-10-CM | POA: Diagnosis present

## 2021-05-11 DIAGNOSIS — F1320 Sedative, hypnotic or anxiolytic dependence, uncomplicated: Secondary | ICD-10-CM

## 2021-05-11 DIAGNOSIS — F39 Unspecified mood [affective] disorder: Secondary | ICD-10-CM | POA: Diagnosis not present

## 2021-05-11 DIAGNOSIS — I5020 Unspecified systolic (congestive) heart failure: Secondary | ICD-10-CM | POA: Diagnosis not present

## 2021-05-11 DIAGNOSIS — E039 Hypothyroidism, unspecified: Secondary | ICD-10-CM

## 2021-05-11 DIAGNOSIS — E1159 Type 2 diabetes mellitus with other circulatory complications: Secondary | ICD-10-CM | POA: Diagnosis not present

## 2021-05-11 DIAGNOSIS — K219 Gastro-esophageal reflux disease without esophagitis: Secondary | ICD-10-CM

## 2021-05-11 DIAGNOSIS — R339 Retention of urine, unspecified: Secondary | ICD-10-CM

## 2021-05-11 DIAGNOSIS — I69354 Hemiplegia and hemiparesis following cerebral infarction affecting left non-dominant side: Secondary | ICD-10-CM

## 2021-05-11 DIAGNOSIS — I25119 Atherosclerotic heart disease of native coronary artery with unspecified angina pectoris: Secondary | ICD-10-CM | POA: Diagnosis not present

## 2021-05-24 ENCOUNTER — Ambulatory Visit (INDEPENDENT_AMBULATORY_CARE_PROVIDER_SITE_OTHER): Payer: Medicare Other | Admitting: Dermatology

## 2021-05-24 ENCOUNTER — Other Ambulatory Visit: Payer: Self-pay

## 2021-05-24 DIAGNOSIS — S01312D Laceration without foreign body of left ear, subsequent encounter: Secondary | ICD-10-CM

## 2021-05-24 NOTE — Progress Notes (Signed)
   Follow-Up Visit   Subjective  Victoria Medina is a 60 y.o. female who presents for the following: Other (Laceration of left earlobe - Repair today).  The following portions of the chart were reviewed this encounter and updated as appropriate:   Tobacco  Allergies  Meds  Problems  Med Hx  Surg Hx  Fam Hx     Review of Systems:  No other skin or systemic complaints except as noted in HPI or Assessment and Plan.  Objective  Well appearing patient in no apparent distress; mood and affect are within normal limits.  A focused examination was performed including left ear. Relevant physical exam findings are noted in the Assessment and Plan.   Assessment & Plan  Laceration of left earlobe Left Ear  Skin excision - Left Ear  Informed consent: discussed and consent obtained   Timeout: patient name, date of birth, surgical site, and procedure verified   Procedure prep:  Patient was prepped and draped in usual sterile fashion Prep type:  Isopropyl alcohol and povidone-iodine Anesthesia: the lesion was anesthetized in a standard fashion   Anesthetic:  1% lidocaine w/ epinephrine 1-100,000 buffered w/ 8.4% NaHCO3 Instrument used comment:  15 c Hemostasis achieved with: pressure   Hemostasis achieved with comment:  Electrocautery Outcome: patient tolerated procedure well with no complications   Post-procedure details: sterile dressing applied and wound care instructions given   Dressing type: bandage and pressure dressing (mupirocin)    Skin repair - Left Ear Complexity:  Complex Final length (cm):  2.6 Reason for type of repair: reduce tension to allow closure, reduce the risk of dehiscence, infection, and necrosis, reduce subcutaneous dead space and avoid a hematoma, allow closure of the large defect, preserve normal anatomy, preserve normal anatomical and functional relationships and enhance both functionality and cosmetic results   Undermining: area extensively undermined    Undermining comment:  Undermining defect 0.5 cm Subcutaneous layers (deep stitches):  Suture size:  5-0 Suture type: Vicryl (polyglactin 910)   Subcutaneous suture technique: inverted dermal. Fine/surface layer approximation (top stitches):  Suture size:  5-0 Suture type: nylon   Stitches: simple running   Suture removal (days):  7 Hemostasis achieved with: suture and pressure Outcome: patient tolerated procedure well with no complications   Post-procedure details: sterile dressing applied and wound care instructions given   Dressing type: bandage and pressure dressing (mupirocin)    Return in about 1 week (around 05/31/2021) for suture removal.  I, Joanie Coddington, CMA, am acting as scribe for Armida Sans, MD . Documentation: I have reviewed the above documentation for accuracy and completeness, and I agree with the above.  Armida Sans, MD

## 2021-05-24 NOTE — Patient Instructions (Signed)
Wound Care Instructions  Cleanse wound gently with soap and water once a day then pat dry with clean gauze. Apply a thing coat of Petrolatum (petroleum jelly, "Vaseline") over the wound (unless you have an allergy to this). We recommend that you use a new, sterile tube of Vaseline. Do not pick or remove scabs. Do not remove the yellow or white "healing tissue" from the base of the wound.  Cover the wound with fresh, clean, nonstick gauze and secure with paper tape. You may use Band-Aids in place of gauze and tape if the would is small enough, but would recommend trimming much of the tape off as there is often too much. Sometimes Band-Aids can irritate the skin.  You should call the office for your biopsy report after 1 week if you have not already been contacted.  If you experience any problems, such as abnormal amounts of bleeding, swelling, significant bruising, significant pain, or evidence of infection, please call the office immediately.  FOR ADULT SURGERY PATIENTS: If you need something for pain relief you may take 1 extra strength Tylenol (acetaminophen) AND 2 Ibuprofen (200mg each) together every 4 hours as needed for pain. (do not take these if you are allergic to them or if you have a reason you should not take them.) Typically, you may only need pain medication for 1 to 3 days.   If you have any questions or concerns for your doctor, please call our main line at [phone] and press option 4 to reach your doctor's medical assistant. If no one answers, please leave a voicemail as directed and we will return your call as soon as possible. Messages left after 4 pm will be answered the following business day.   You may also send us a message via MyChart. We typically respond to MyChart messages within 1-2 business days.  For prescription refills, please ask your pharmacy to contact our office. Our fax number is [phone].  If you have an urgent issue when the clinic is closed that  cannot wait until the next business day, you can page your doctor at the number below.    Please note that while we do our best to be available for urgent issues outside of office hours, we are not available 24/7.   If you have an urgent issue and are unable to reach us, you may choose to seek medical care at your doctor's office, retail clinic, urgent care center, or emergency room.  If you have a medical emergency, please immediately call 911 or go to the emergency department.  Pager Numbers  - Dr. Kowalski: [phone]  - Dr. Moye: [phone]  - Dr. Stewart: [phone]  In the event of inclement weather, please call our main line at [phone] for an update on the status of any delays or closures.  Dermatology Medication Tips: Please keep the boxes that topical medications come in in order to help keep track of the instructions about where and how to use these. Pharmacies typically print the medication instructions only on the boxes and not directly on the medication tubes.   If your medication is too expensive, please contact our office at [phone] option 4 or send us a message through MyChart.   We are unable to tell what your co-pay for medications will be in advance as this is different depending on your insurance coverage. However, we may be able to find a substitute medication at lower cost or fill out paperwork to get insurance to cover a needed   medication.   If a prior authorization is required to get your medication covered by your insurance company, please allow us 1-2 business days to complete this process.  Drug prices often vary depending on where the prescription is filled and some pharmacies may offer cheaper prices.  The website www.goodrx.com contains coupons for medications through different pharmacies. The prices here do not account for what the cost may be with help from insurance (it may be cheaper with your insurance), but the website can give you the  price if you did not use any insurance.  - You can print the associated coupon and take it with your prescription to the pharmacy.  - You may also stop by our office during regular business hours and pick up a GoodRx coupon card.  - If you need your prescription sent electronically to a different pharmacy, notify our office through [facility] MyChart or by phone at [phone] option 4.   

## 2021-05-25 ENCOUNTER — Encounter: Payer: Self-pay | Admitting: Dermatology

## 2021-05-25 ENCOUNTER — Telehealth: Payer: Self-pay

## 2021-05-25 NOTE — Progress Notes (Signed)
Cath Change/ Replacement  Patient is present today for a catheter change due to urinary retention.  8 ml of water was removed from the balloon, a 16 FR foley cath was removed with out difficulty.  Patient was cleaned and prepped in a sterile fashion with betadine. A 16 FR foley cath was replaced into the bladder no complications were noted Urine return was noted 40 ml and urine was yellow clear in color. The balloon was filled with 35ml of sterile water. A night bag was attached for drainage.  Patient was given proper instruction on catheter care.    Performed by: Michiel Cowboy, PA-C   Follow up: One month for Foley exchange.

## 2021-05-25 NOTE — Telephone Encounter (Signed)
Left message for patient regarding surgery/hd  

## 2021-05-26 ENCOUNTER — Other Ambulatory Visit: Payer: Self-pay

## 2021-05-26 ENCOUNTER — Encounter: Payer: Self-pay | Admitting: Urology

## 2021-05-26 ENCOUNTER — Ambulatory Visit (INDEPENDENT_AMBULATORY_CARE_PROVIDER_SITE_OTHER): Payer: Medicare Other | Admitting: Urology

## 2021-05-26 VITALS — BP 138/84 | HR 78 | Ht 61.0 in | Wt 175.0 lb

## 2021-05-26 DIAGNOSIS — Z466 Encounter for fitting and adjustment of urinary device: Secondary | ICD-10-CM

## 2021-05-31 ENCOUNTER — Telehealth: Payer: Self-pay | Admitting: Urology

## 2021-05-31 ENCOUNTER — Ambulatory Visit (INDEPENDENT_AMBULATORY_CARE_PROVIDER_SITE_OTHER): Payer: Medicare Other | Admitting: Dermatology

## 2021-05-31 ENCOUNTER — Encounter: Payer: Self-pay | Admitting: Dermatology

## 2021-05-31 ENCOUNTER — Other Ambulatory Visit: Payer: Self-pay

## 2021-05-31 DIAGNOSIS — Z4802 Encounter for removal of sutures: Secondary | ICD-10-CM

## 2021-05-31 DIAGNOSIS — T1490XD Injury, unspecified, subsequent encounter: Secondary | ICD-10-CM

## 2021-05-31 NOTE — Telephone Encounter (Signed)
Victoria Medina from [facility] called office asking about pt getting SPT tube put in under local anesthesia, not putting her to sleep.  She would like for someone to call her back.  [phone]

## 2021-05-31 NOTE — Telephone Encounter (Signed)
Spoke with Victoria Medina and they have been trying to get SPT at [facility] since April, since pt stated she has to be put to sleep. Now pt states someone in our office told her we can place the spt at our office and now pt is requesting spt . I advised that she would be sent to IR, pt would like spt now due to bladder spasms and the foley being uncomfortable. Per cheryl pt does not take bladder spasm meds like she should. Please advise

## 2021-05-31 NOTE — Telephone Encounter (Signed)
Michiel Cowboy A, PA-C  You 9 minutes ago (2:09 PM)   We need to get her in with Dr. Lonna Cobb for a cystoscopy prior to SPT placement, but we need to emphasize that she can still have spasms with an SPT in place.

## 2021-05-31 NOTE — Patient Instructions (Signed)

## 2021-05-31 NOTE — Telephone Encounter (Signed)
Spoke with cheryl at [facility] and she will speak with patient and see what she wants to do.

## 2021-05-31 NOTE — Progress Notes (Signed)
   Follow-Up Visit   Subjective  Victoria Medina is a 60 y.o. female who presents for the following: suture removal (S/P ear lobe repair of the L ear. Patient c/o itching at site after surgery, but no other issues. ).  The following portions of the chart were reviewed this encounter and updated as appropriate:   Tobacco  Allergies  Meds  Problems  Med Hx  Surg Hx  Fam Hx     Review of Systems:  No other skin or systemic complaints except as noted in HPI or Assessment and Plan.  Objective  Well appearing patient in no apparent distress; mood and affect are within normal limits.  A focused examination was performed including the L ear lobe. Relevant physical exam findings are noted in the Assessment and Plan.  Left Ear Healing excision site.   Assessment & Plan  Healing wound Left Ear  Encounter for Removal of Sutures - Incision site at the L ear lobe is clean, dry and intact - Wound cleansed, sutures removed, wound cleansed and steri strips applied.  - Patient advised to keep steri-strips dry until they fall off. - Scars remodel for a full year. - Once steri-strips fall off, patient can apply over-the-counter silicone scar cream each night to help with scar remodeling if desired. - Patient advised to call with any concerns or if they notice any new or changing lesions.  Do not have ear piercing until after 6 weeks post surgery.  Advised that we do ear piercings here at [facility] for $90 which includes surgical steel earrings.   If we do not pierce it here in the dermatology office, make sure that the ear is not pierced within the scar.  The patient states she would not likely have a repiercing as she has other piercings already.  Return if symptoms worsen or fail to improve.  Maylene Roes, CMA, am acting as scribe for Armida Sans, MD . Documentation: I have reviewed the above documentation for accuracy and completeness, and I agree with the above.  Armida Sans, MD

## 2021-06-01 NOTE — Telephone Encounter (Signed)
Victoria Medina w/[facility] returned call.  She s/w pt and she would like to proceed with treatments.

## 2021-06-06 NOTE — Telephone Encounter (Signed)
Victoria Medina w/[facility] stating pt would like to proceed with having SPT Tube placed.  She would like for someone to return her call to let her know what they need to do next.  [phone]

## 2021-06-06 NOTE — Telephone Encounter (Signed)
Victoria Medina, per Carollee Herter message below, she needs cysto with Dr. Lonna Cobb

## 2021-06-09 ENCOUNTER — Encounter: Payer: Self-pay | Admitting: Urology

## 2021-06-09 ENCOUNTER — Other Ambulatory Visit: Payer: Self-pay

## 2021-06-09 ENCOUNTER — Ambulatory Visit (INDEPENDENT_AMBULATORY_CARE_PROVIDER_SITE_OTHER): Payer: Medicare Other | Admitting: Urology

## 2021-06-09 DIAGNOSIS — R339 Retention of urine, unspecified: Secondary | ICD-10-CM

## 2021-06-09 DIAGNOSIS — N319 Neuromuscular dysfunction of bladder, unspecified: Secondary | ICD-10-CM

## 2021-06-09 NOTE — Progress Notes (Signed)
Cath Change/ Replacement  Patient is present today for a catheter change due to urinary retention.  8 ml of water was removed from the balloon, a 16FR foley cath was removed with out difficulty.  Patient was cleaned and prepped in a sterile fashion with betadine. A 16 FR foley cath was replaced into the bladder no complications were noted Urine return was noted 69ml and urine was yellow in color. The balloon was filled with 71ml of sterile water. A nigjht bag was attached for drainage.  A night bag was also given to the patient and patient was given instruction on how to change from one bag to another. Patient was given proper instruction on catheter care.    Performed by:  Ples Specter CMA

## 2021-06-09 NOTE — Progress Notes (Signed)
   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 A&Ox3.   No respiratory distress   Abd soft, NT, ND Normal external genitalia with patent urethral meatus  Cystoscopy Procedure Note  Patient identification was confirmed, informed consent was obtained, and patient was prepped using Betadine solution.  Lidocaine jelly was administered per urethral meatus.    Procedure: - Flexible cystoscope introduced, without any difficulty.   - Thorough search of the bladder revealed:    normal urethral meatus    Mild erythema bladder base secondary to chronic catheter    no stones    no ulcers     no tumors    no urethral polyps    no trabeculation  - Ureteral orifices were normal in position and appearance.  Post-Procedure: - Patient tolerated the procedure well  Assessment/ Plan: No significant mucosal abnormalities on cystoscopy Foley catheter was changed Continue monthly catheter changes Consider follow-up cystoscopy in 5 years    Riki Altes, MD

## 2021-06-21 ENCOUNTER — Telehealth: Payer: Self-pay

## 2021-06-21 DIAGNOSIS — N319 Neuromuscular dysfunction of bladder, unspecified: Secondary | ICD-10-CM

## 2021-06-21 NOTE — Telephone Encounter (Signed)
-----   Message from Harle Battiest, PA-C sent at 06/12/2021  5:29 PM EST ----- Regarding: Suprapubic Tube Placement [name] would like a suprapubic tube placed.

## 2021-06-28 ENCOUNTER — Ambulatory Visit: Payer: Medicare Other | Admitting: Urology

## 2021-06-28 NOTE — Telephone Encounter (Signed)
Order placed and form faxed to IR. Message sent to IR scheduling, patient will need clearance to stop Brilinta 5 days prior to placement- will send clearance note to Dr. Alphonsus Sias. Once clearance is received will update scheduling for a placement date & time

## 2021-06-29 ENCOUNTER — Ambulatory Visit: Payer: Medicare Other | Admitting: Urology

## 2021-07-01 NOTE — Telephone Encounter (Signed)
REQUEST FOR CLEARANCE   Date: 07/01/21  Request Clearance from Dr. Alphonsus Sias  Faxed to: [phone]  Procedure: CT guided Suprapubic Tube Placement  Date of Procedure: TBD  Provider: Michiel Cowboy, [facility]  Clearance Needed : Yes  Reason: Stop Brilinta 5 days prior to procedure   Risk Assessment:    Low   []       Moderate   []     High   []           Physician Signature:__________________________________   Printed Name: ________________________________________   Date: _________________

## 2021-07-01 NOTE — Telephone Encounter (Signed)
Called [facility] [phone]) and spoke with patient's caretaker Shanda Bumps to notify her of this update. She verbalized understanding and states she will update the patient.

## 2021-07-04 NOTE — Telephone Encounter (Signed)
Cheryl from [facility] called regard clearance for DC of Brilinta. Please call back at [phone]

## 2021-07-05 NOTE — Telephone Encounter (Signed)
Called and spoke with Elnita Maxwell from [facility] she states that she spoke with Dr. Alphonsus Sias and he gave verbal ok to DC Brilinta prior to procedure they would just need the date. It was explained that IR was waiting to schedule based on clearance status. Clearance note was sent to [facility]'s fax att: Elnita Maxwell . Note was re faxed to updated number, will await response

## 2021-07-06 NOTE — Progress Notes (Signed)
Cath Change/ Replacement  Patient is present today for a catheter change due to urinary retention.  9 ml of water was removed from the balloon, a 16 FR foley cath was removed with out difficulty.  Patient was cleaned and prepped in a sterile fashion with betadine. A 16 FR silicone coated foley cath was replaced into the bladder no complications were noted Urine return was noted 50 ml and urine was yellow in color. The balloon was filled with 44ml of sterile water. A night bag was attached for drainage.    Performed by: Michiel Cowboy, PA-C and Gerarda Gunther, CMA  Follow up: For SPT placement with IR

## 2021-07-07 ENCOUNTER — Ambulatory Visit (INDEPENDENT_AMBULATORY_CARE_PROVIDER_SITE_OTHER): Payer: Medicare Other | Admitting: Urology

## 2021-07-07 ENCOUNTER — Other Ambulatory Visit: Payer: Self-pay

## 2021-07-07 DIAGNOSIS — N319 Neuromuscular dysfunction of bladder, unspecified: Secondary | ICD-10-CM

## 2021-07-11 NOTE — Telephone Encounter (Signed)
Per IR scheduling patient is scheduled for 12/19 at 10a with a 9a arrival time. IR Nurse will call to review instructions prior to the procedure.  The last dose of Brilinta should be tomorrow 12/13. Patient should NOT take it after that until after the procedure.  Called [facility] [phone]) and spoke with caregiver Elnita Maxwell. She was notified of apt and instructions to stop Brilinta and NPO from midnight prior. Up sizing and scheduling 4-6wks post placement was also reviewed. Elnita Maxwell verbalized understanding

## 2021-07-11 NOTE — Telephone Encounter (Signed)
Clearance not received, patient is cleared to stop, IR scheduling updated awaiting apt response

## 2021-07-14 NOTE — Progress Notes (Signed)
Patient for SPT placement 07/18/2021, called and spoke with [name]/sister of this patient with pre procedure instructions given. Made aware to be here @ 0900 on 12/19, NPO after MN prior to procedure, as well as have been holding brilinta since 12/13 for procedure. Will need ride post procedure after discharge. Stated understanding.

## 2021-07-15 ENCOUNTER — Other Ambulatory Visit: Payer: Self-pay | Admitting: Physician Assistant

## 2021-07-18 ENCOUNTER — Other Ambulatory Visit: Payer: Self-pay

## 2021-07-18 ENCOUNTER — Other Ambulatory Visit: Payer: Self-pay | Admitting: Urology

## 2021-07-18 ENCOUNTER — Ambulatory Visit
Admission: RE | Admit: 2021-07-18 | Discharge: 2021-07-18 | Disposition: A | Discharge status: Home / Self Care | Payer: Medicare Other | Source: Ambulatory Visit | Attending: Urology | Admitting: Urology

## 2021-07-18 ENCOUNTER — Encounter: Payer: Self-pay | Admitting: Radiology

## 2021-07-18 DIAGNOSIS — N319 Neuromuscular dysfunction of bladder, unspecified: Secondary | ICD-10-CM

## 2021-07-18 DIAGNOSIS — Z8673 Personal history of transient ischemic attack (TIA), and cerebral infarction without residual deficits: Secondary | ICD-10-CM | POA: Diagnosis not present

## 2021-07-18 HISTORY — DX: Acute myocardial infarction, unspecified: I21.9

## 2021-07-18 HISTORY — DX: Personal history of other diseases of the nervous system and sense organs: Z86.69

## 2021-07-18 MED ORDER — SODIUM CHLORIDE 0.9 % IV SOLN: INTRAVENOUS | Status: DC

## 2021-07-18 MED ORDER — FENTANYL CITRATE (PF) 100 MCG/2ML IJ SOLN
INTRAMUSCULAR | Status: AC | PRN
  Administered 2021-07-18 (×2): 50 ug via INTRAVENOUS

## 2021-07-18 MED ORDER — MIDAZOLAM HCL 2 MG/2ML IJ SOLN
INTRAMUSCULAR | Status: AC | PRN
Start: 2021-07-18 — End: 2021-07-18
  Administered 2021-07-18 (×3): 1 mg via INTRAVENOUS

## 2021-07-18 MED ORDER — FENTANYL CITRATE (PF) 100 MCG/2ML IJ SOLN
INTRAMUSCULAR | Status: AC
  Filled 2021-07-18: qty 2

## 2021-07-18 MED ORDER — DIPHENHYDRAMINE HCL 50 MG/ML IJ SOLN: 50.0000 mg | Freq: Once | INTRAMUSCULAR | Status: AC

## 2021-07-18 MED ORDER — MIDAZOLAM HCL 2 MG/2ML IJ SOLN
INTRAMUSCULAR | Status: AC
  Filled 2021-07-18: qty 4

## 2021-07-18 MED ORDER — GADOBUTROL 1 MMOL/ML IV SOLN
5.0000 mL | Freq: Once | INTRAVENOUS | Status: AC | PRN
  Administered 2021-07-18: 13:00:00 5 mL
  Filled 2021-07-18: qty 6

## 2021-07-18 MED ORDER — LIDOCAINE HCL 1 % IJ SOLN
INTRAMUSCULAR | Status: AC
  Administered 2021-07-18: 12:00:00 20 mL
  Filled 2021-07-18: qty 20

## 2021-07-18 MED ORDER — DIPHENHYDRAMINE HCL 50 MG/ML IJ SOLN
INTRAMUSCULAR | Status: AC
  Administered 2021-07-18: 12:00:00 50 mg via INTRAVENOUS
  Filled 2021-07-18: qty 1

## 2021-07-18 NOTE — Procedures (Signed)
Interventional Radiology Procedure Note  Date of Procedure: 07/18/2021  Procedure: SPT placement   Findings:  1. Successful placement of 16 Fr suprapubic catheter with [place] and fluoro guidance  2. Connected to gravity bag    Complications: No immediate complications noted.   Estimated Blood Loss: minimal  Follow-up and Recommendations: 1. Foley can be removed in recovery  2. Pt will return to IR in 2-4 weeks for planned exchange and upsize of SPT to 18 Fr Foley catheter    Olive Bass, MD  Vascular & Interventional Radiology  07/18/2021 12:57 PM

## 2021-07-18 NOTE — Consult Note (Signed)
Chief Complaint: Patient was seen in consultation today for neurogenic bladder at the request of McGowan,Shannon A  Referring Physician(s): McGowan,Shannon A  Supervising Physician: Pernell Dupre  Patient Status: [facility] - Out-pt  History of Present Illness: Victoria Medina is a 60 y.o. female with PMHx significant for CVA and neurogenic bladder currently with indwelling foley catheter in place. Urology has requested IR image guided suprapubic catheter placement with moderate sedation. The patient denies any current abdominal/pelvic pain, chest pain or shortness of breath. She has stopped her Brilinta x 5 days. The patient denies any recent infections, fever or chills. The patient denies any history of sleep apnea or chronic oxygen use. She has no known complications to sedation. She does have an iodinated contrast allergy with unknown reaction 40 years ago, but states she is unable to have this.   Past Medical History:  Diagnosis Date   Blind    History of migraine headaches    Hyperlipemia    Myocardial infarction [facility])    Stroke [facility])     Past Surgical History:  Procedure Laterality Date   ABDOMINAL HYSTERECTOMY     BACK SURGERY     CARDIAC CATHETERIZATION     CORONARY/GRAFT ACUTE MI REVASCULARIZATION N/A 01/25/2020   Procedure: Coronary/Graft Acute MI Revascularization;  Surgeon: Iran Ouch, MD;  Location: [facility] INVASIVE CV LAB;  Service: Cardiovascular;  Laterality: N/A;   FRACTURE SURGERY     LEFT HEART CATH AND CORONARY ANGIOGRAPHY N/A 01/25/2020   Procedure: LEFT HEART CATH AND CORONARY ANGIOGRAPHY;  Surgeon: Iran Ouch, MD;  Location: [facility] INVASIVE CV LAB;  Service: Cardiovascular;  Laterality: N/A;   SKIN GRAFT Right     Allergies: Levaquin [levofloxacin in d5w], Iodinated diagnostic agents, Other, and Latex  Medications: Prior to Admission medications   Medication Sig Start Date End Date Taking? Authorizing Provider  Azelastine HCl 0.15 % SOLN Place  2 sprays into both nostrils 2 (two) times daily. 01/14/20  Yes [provider]  bisacodyl (DULCOLAX) 10 MG suppository Place 10 mg rectally as needed for moderate constipation.   Yes [provider]  clonazePAM (KLONOPIN) 0.5 MG tablet Take 0.5 mg by mouth at bedtime.   Yes [provider]  lansoprazole (PREVACID) 15 MG capsule Take 15 mg by mouth daily at 12 noon.    Yes [provider]  levothyroxine (SYNTHROID) 25 MCG tablet Take 25 mcg by mouth daily before breakfast.   Yes [provider]  Vitamin D, Ergocalciferol, (DRISDOL) 50000 units CAPS capsule Take 50,000 Units by mouth every Monday.   Yes [provider]  aspirin EC 81 MG tablet Take 81 mg by mouth daily. Swallow whole.    [provider]  conjugated estrogens (PREMARIN) vaginal cream Apply 0.5mg  (pea-sized amount)  just inside the vaginal introitus with a finger-tip on  Monday, Wednesday and Friday nights. Patient taking differently: Apply 0.5mg  (pea-sized amount)  just inside the vaginal introitus with a finger-tip on  Monday, Wednesday and Friday nights. 07/21/20   Michiel Cowboy A, PA-C  magnesium citrate SOLN Take 5 mLs by mouth as needed for severe constipation.    [provider]  metFORMIN (GLUCOPHAGE) 500 MG tablet Take 500 mg by mouth 2 (two) times daily.    [provider]  nitroGLYCERIN (NITROSTAT) 0.4 MG SL tablet Place 0.4 mg under the tongue every 5 (five) minutes x 3 doses as needed for chest pain.     [provider]  nystatin-triamcinolone ointment [facility]) Apply  1 application topically 2 (two) times daily. 01/21/21   Michiel Cowboy A, PA-C  sennosides-docusate sodium (SENOKOT-S) 8.6-50 MG tablet Take 1 tablet by mouth in the morning and at bedtime.    [provider]  ticagrelor (BRILINTA) 60 MG TABS tablet Take 1 tablet by mouth 2 (two) times daily.    [provider]  tiZANidine (ZANAFLEX) 2 MG tablet Take 2  mg by mouth 3 (three) times daily as needed for muscle spasms.    [provider]  trospium (SANCTURA) 20 MG tablet Take 1 tablet (20 mg total) by mouth 2 (two) times daily. 04/01/21   Harle Battiest, PA-C     Family History  Problem Relation Age of Onset   Heart attack Mother    Hypertension Mother    Hypercholesterolemia Mother    Diabetes Mother    Stroke Father    Heart attack Father    Hypertension Father    Hypercholesterolemia Father    Diabetes Father    Diabetes Maternal Grandmother    Cancer - Other Maternal Grandmother     Social History   Socioeconomic History   Marital status: Single    Spouse name: Not on file   Number of children: Not on file   Years of education: Not on file   Highest education level: Not on file  Occupational History   Not on file  Tobacco Use   Smoking status: Never   Smokeless tobacco: Never  Vaping Use   Vaping Use: Never used  Substance and Sexual Activity   Alcohol use: Yes    Comment: occasional   Drug use: No   Sexual activity: Not on file  Other Topics Concern   Not on file  Social History Narrative   Not on file   Social Determinants of Health   Financial Resource Strain: Not on file  Food Insecurity: Not on file  Transportation Needs: Not on file  Physical Activity: Not on file  Stress: Not on file  Social Connections: Not on file   Review of Systems: A 12 point ROS discussed and pertinent positives are indicated in the HPI above.  All other systems are negative.  Review of Systems  Vital Signs: BP 139/85    Pulse 78    Temp 98.4 F (36.9 C) (Oral)    Resp 18    Ht 5' 1.2" (1.554 m)    Wt 175 lb (79.4 kg)    SpO2 94%    BMI 32.85 kg/m   Physical Exam Constitutional:      Appearance: Normal appearance.  HENT:     Head: Normocephalic and atraumatic.  Cardiovascular:     Rate and Rhythm: Normal rate and regular rhythm.  Pulmonary:     Effort: Pulmonary effort is normal. No respiratory distress.      Breath sounds: Normal breath sounds.  Neurological:     Mental Status: She is alert and oriented to person, place, and time.  Foley catheter intact with clear yellow urine.  Imaging: No results found.  Labs:  CBC: Recent Labs    04/22/21 1334 04/23/21 0612 04/24/21 0956 04/25/21 0522  WBC 16.8* 16.2* 12.4* 9.6  HGB 8.2* 7.9* 7.7* 8.2*  HCT 27.3* 27.4* 25.4* 27.5*  PLT 566* 576* 555* 568*    COAGS: No results for input(s): INR, APTT in the last 8760 hours.  BMP: Recent Labs    04/22/21 1334 04/23/21 0612 04/24/21 0956 04/25/21 0522  NA 130* 136 137 136  K  3.8 3.9 3.7 3.4*  CL 94* 99 103 101  CO2 29 27 26 25   GLUCOSE 115* 146* 118* 103*  BUN 9 9 10 8   CALCIUM 8.4* 8.6* 8.6* 8.7*  CREATININE 0.80 0.74 0.74 0.74  GFRNONAA >60 >60 >60 >60    LIVER FUNCTION TESTS: Recent Labs    04/22/21 1334 04/23/21 0612 04/24/21 0956 04/25/21 0522  BILITOT 0.6 0.5 0.6 0.7  AST 10* 12* 10* 12*  ALT 9 8 8 7   ALKPHOS 84 89 84 82  PROT 7.5 7.3 7.1 7.2  ALBUMIN 3.8 3.8 3.5 3.6    Assessment and Plan: 60 year old female with PMHx significant for CVA and neurogenic bladder currently with indwelling foley catheter in place. Urology has requested IR image guided suprapubic catheter placement with moderate sedation.   The patient has been NPO, no blood thinners taken-Brilinta has been held 5 days, labs and vitals have been reviewed. Iodinated contrast allergy- will use Gadolinium, this has been discussed today with Dr. 04/27/21.   Risks and benefits discussed with the patient including bleeding, infection, damage to adjacent structures, need for additional upsizing and exchange of suprapubic catheter procedures and routine exchanges.  All of the patient's questions were answered, patient is agreeable to proceed. Consent signed and in chart.   Thank you for this interesting consult.  I greatly enjoyed meeting Victoria Medina and look forward to participating in their care.  A  copy of this report was sent to the requesting provider on this date.  Electronically Signed: 67, PA-C 07/18/2021, 11:54 AM   I spent a total of 15 Minutes in face to face in clinical consultation, greater than 50% of which was counseling/coordinating care for neurogenic bladder.

## 2021-07-21 DIAGNOSIS — I5022 Chronic systolic (congestive) heart failure: Secondary | ICD-10-CM | POA: Diagnosis not present

## 2021-07-21 DIAGNOSIS — E1159 Type 2 diabetes mellitus with other circulatory complications: Secondary | ICD-10-CM | POA: Diagnosis not present

## 2021-07-21 DIAGNOSIS — K219 Gastro-esophageal reflux disease without esophagitis: Secondary | ICD-10-CM

## 2021-07-21 DIAGNOSIS — F39 Unspecified mood [affective] disorder: Secondary | ICD-10-CM

## 2021-07-21 DIAGNOSIS — K8080 Other cholelithiasis without obstruction: Secondary | ICD-10-CM

## 2021-07-21 DIAGNOSIS — Z4901 Encounter for fitting and adjustment of extracorporeal dialysis catheter: Secondary | ICD-10-CM

## 2021-07-21 DIAGNOSIS — G8194 Hemiplegia, unspecified affecting left nondominant side: Secondary | ICD-10-CM | POA: Diagnosis not present

## 2021-07-21 DIAGNOSIS — I25119 Atherosclerotic heart disease of native coronary artery with unspecified angina pectoris: Secondary | ICD-10-CM | POA: Diagnosis not present

## 2021-08-03 ENCOUNTER — Other Ambulatory Visit (HOSPITAL_COMMUNITY): Payer: Self-pay | Admitting: Interventional Radiology

## 2021-08-03 DIAGNOSIS — N319 Neuromuscular dysfunction of bladder, unspecified: Secondary | ICD-10-CM

## 2021-08-09 ENCOUNTER — Other Ambulatory Visit: Payer: Self-pay | Admitting: Family Medicine

## 2021-08-09 ENCOUNTER — Telehealth: Payer: Self-pay | Admitting: Family Medicine

## 2021-08-09 ENCOUNTER — Other Ambulatory Visit: Payer: Self-pay | Admitting: Radiology

## 2021-08-09 DIAGNOSIS — N319 Neuromuscular dysfunction of bladder, unspecified: Secondary | ICD-10-CM

## 2021-08-09 NOTE — Telephone Encounter (Signed)
The order for SPT exchange has been filled out and faxed to IR. I will notified patient of date and time.

## 2021-08-09 NOTE — Telephone Encounter (Signed)
Appointment is scheduled for 08/10/2021 @12 :30. NPO 6 hours prior arrive at 11:30. from [facility] notified and voiced understanding.

## 2021-08-10 ENCOUNTER — Ambulatory Visit
Admission: RE | Admit: 2021-08-10 | Discharge: 2021-08-10 | Disposition: A | Discharge status: Home / Self Care | Payer: Medicare Other | Source: Ambulatory Visit | Attending: Urology | Admitting: Urology

## 2021-08-10 ENCOUNTER — Ambulatory Visit (HOSPITAL_COMMUNITY): Admission: RE | Admit: 2021-08-10 | Payer: Medicare Other | Source: Ambulatory Visit

## 2021-08-10 DIAGNOSIS — N319 Neuromuscular dysfunction of bladder, unspecified: Secondary | ICD-10-CM | POA: Insufficient documentation

## 2021-08-10 HISTORY — PX: IR CATHETER TUBE CHANGE: IMG717

## 2021-08-10 MED ORDER — LIDOCAINE HCL 1 % IJ SOLN
INTRAMUSCULAR | Status: AC
  Administered 2021-08-10: 5 mL
  Filled 2021-08-10: qty 20

## 2021-08-10 MED ORDER — GADOBUTROL 1 MMOL/ML IV SOLN
10.0000 mL | Freq: Once | INTRAVENOUS | Status: AC | PRN
  Administered 2021-08-10: 10 mL
  Filled 2021-08-10: qty 10

## 2021-08-14 ENCOUNTER — Emergency Department
Admission: EM | Admit: 2021-08-14 | Discharge: 2021-08-14 | Disposition: A | Discharge status: Skilled Nursing Facility | Payer: Medicare Other | Attending: Emergency Medicine | Admitting: Emergency Medicine

## 2021-08-14 ENCOUNTER — Other Ambulatory Visit: Payer: Self-pay

## 2021-08-14 DIAGNOSIS — T83091A Other mechanical complication of indwelling urethral catheter, initial encounter: Secondary | ICD-10-CM | POA: Insufficient documentation

## 2021-08-14 DIAGNOSIS — Y846 Urinary catheterization as the cause of abnormal reaction of the patient, or of later complication, without mention of misadventure at the time of the procedure: Secondary | ICD-10-CM | POA: Insufficient documentation

## 2021-08-14 DIAGNOSIS — T83010A Breakdown (mechanical) of cystostomy catheter, initial encounter: Secondary | ICD-10-CM

## 2021-08-14 DIAGNOSIS — T83011A Breakdown (mechanical) of indwelling urethral catheter, initial encounter: Secondary | ICD-10-CM

## 2021-08-14 NOTE — ED Triage Notes (Signed)
Pt BIB via EMS for suprapubic cath issues. Per EMS, facility stated that the surpapubic cath hasn't been draining for 8-10 hours. RN able to see urine is drainage tubing.

## 2021-08-14 NOTE — ED Provider Notes (Signed)
[facility] Provider Note    None    (approximate)   History   Suprapubic Cath Issues    HPI  Victoria Medina is a 61 y.o. female who presents to the ED for evaluation of Suprapubic Cath Issues    I review outpatient urology visit from 12/8.  Patient has a history of neurogenic bladder and a suprapubic catheter in place.  Blind.  She presents to the ED from her local SNF for evaluation of catheter malfunction because the suture holding the catheter to her suprapubic skin entrance site broke free from the skin.  When she arrives to the ED, her catheter still draining urine and balloon appears to still be in place as I met resistance when pulling gentle traction.  Denies any worsening pain.   Physical Exam   Triage Vital Signs: ED Triage Vitals  Enc Vitals Group     BP 08/14/21 1935 (!) 146/98     Pulse Rate 08/14/21 1935 89     Resp 08/14/21 1935 20     Temp 08/14/21 1935 97.8 F (36.6 C)     Temp src --      SpO2 08/14/21 1935 99 %     Weight --      Height --      Head Circumference --      Peak Flow --      Pain Score 08/14/21 1933 6     Pain Loc --      Pain Edu? --      Excl. in [place]? --     Most recent vital signs: Vitals:   08/14/21 1935  BP: (!) 146/98  Pulse: 89  Resp: 20  Temp: 97.8 F (36.6 C)  SpO2: 99%    General: Awake, no distress.  CV:  Good peripheral perfusion.  Resp:  Normal effort.  Abd:  No distention.  Benign exam. MSK:  No deformity noted.  Neuro:  No focal deficits appreciated. Other:  Suprapubic catheter entrance site is clean, dry and intact.  There is a suture wrapped tightly around the catheter right at the skin entrance site, but is not attached to the skin.  Draining clear urine.   ED Results / Procedures / Treatments   Labs (all labs ordered are listed, but only abnormal results are displayed) Labs Reviewed - No data to display  EKG    RADIOLOGY   Official radiology report(s): No results  found.  PROCEDURES and INTERVENTIONS:  Procedures  I gently remove the pre-existing suture from the suprapubic catheter tubing.  I cleaned the skin widely around the entrance site of the suprapubic catheter with iodine solution.  Placed a single 3-0 nylon stitch at the skin, tied at the skin, and then firmly tied around the suprapubic catheter. Continues to drain appropriately.  Balloon remains inflated and resists traction.  Tolerated well without evidence of immediate complication.  Medications - No data to display   IMPRESSION / MDM / [place] / ED COURSE  I reviewed the triage vital signs and the nursing notes.  61 year old female presents to the ED with the suture removed from her suprapubic catheter entrance site, requiring replacement, but suitable for return to facility after this.  Patient looks clinically well.  Catheter remains in place and draining appropriately, and balloon remains inflated.  I replaced the stitch, as above, which is well-tolerated.  We will return to facility.      FINAL CLINICAL IMPRESSION(S) / ED DIAGNOSES  Final diagnoses:  Malfunction of indwelling urinary catheter, initial encounter [facility])  Suprapubic catheter dysfunction, initial encounter [facility])     Rx / DC Orders   ED Discharge Orders     None        Note:  This document was prepared using Dragon voice recognition software and may include unintentional dictation errors.   Vladimir Crofts, MD 08/14/21 2012

## 2021-08-14 NOTE — Discharge Instructions (Addendum)
We replaced the stitch holding her catheter to her belly.  Catheter seems to be working fine.

## 2021-08-24 ENCOUNTER — Telehealth: Payer: Self-pay | Admitting: Family Medicine

## 2021-08-24 MED ORDER — OXYBUTYNIN CHLORIDE 5 MG PO TABS: 5.0000 mg | ORAL_TABLET | Freq: Two times a day (BID) | ORAL | 1 refills | Status: DC

## 2021-08-24 NOTE — Telephone Encounter (Signed)
The nurse at [facility] called and states patient has been getting wet around the catheter tube. She states there is urine in bag and they have emptied it several times today. I informed her that she is probably having bladder spasms. I spoke to [facility] and Oxybutynin was sent to the pharmacy for patient to take 2 times daily as need for bladder spasms.

## 2021-08-30 ENCOUNTER — Other Ambulatory Visit: Payer: Self-pay | Admitting: Family Medicine

## 2021-08-30 DIAGNOSIS — R339 Retention of urine, unspecified: Secondary | ICD-10-CM

## 2021-08-30 DIAGNOSIS — N319 Neuromuscular dysfunction of bladder, unspecified: Secondary | ICD-10-CM

## 2021-09-02 ENCOUNTER — Telehealth: Payer: Self-pay | Admitting: Family Medicine

## 2021-09-02 NOTE — Telephone Encounter (Signed)
Left message on Victoria Medina number and gave her the date and time of the IR SPT upsize. Monday 09/05/21 arrive at 9:00. I notified [facility] and they will have her at the appointment time.

## 2021-09-05 ENCOUNTER — Ambulatory Visit: Admission: RE | Admit: 2021-09-05 | Payer: Medicare Other | Source: Ambulatory Visit | Admitting: Radiology

## 2021-09-05 NOTE — Progress Notes (Signed)
Suprapubic Cath Change  Patient is present today for a suprapubic catheter change due to urinary retention.  9 ml of water was drained from the balloon, a 16 FR council-tip foley cath was removed from the tract with out difficulty.  Site was cleaned and prepped in a sterile fashion with betadine.  A 16FR foley cath was replaced into the tract no complications were noted. Urine return was noted, 10 ml of sterile water was inflated into the balloon and a leg bag was attached for drainage.  Patient tolerated well. A night bag was given to patient and proper instruction was given on how to switch bags.    Performed by: Michiel Cowboy, PA-C  and Honor Loh, CMA  Follow up: One month for SPT exchange.  Will consider up sizing to a an 56fr if she continues to leak.

## 2021-09-06 ENCOUNTER — Other Ambulatory Visit: Payer: Self-pay

## 2021-09-06 ENCOUNTER — Ambulatory Visit (INDEPENDENT_AMBULATORY_CARE_PROVIDER_SITE_OTHER): Payer: Medicare Other | Admitting: Urology

## 2021-09-06 DIAGNOSIS — Z466 Encounter for fitting and adjustment of urinary device: Secondary | ICD-10-CM | POA: Diagnosis not present

## 2021-09-08 ENCOUNTER — Ambulatory Visit: Payer: Medicare Other | Admitting: Urology

## 2021-09-14 DIAGNOSIS — K8018 Calculus of gallbladder with other cholecystitis without obstruction: Secondary | ICD-10-CM

## 2021-09-14 DIAGNOSIS — I5020 Unspecified systolic (congestive) heart failure: Secondary | ICD-10-CM | POA: Diagnosis not present

## 2021-09-14 DIAGNOSIS — I69354 Hemiplegia and hemiparesis following cerebral infarction affecting left non-dominant side: Secondary | ICD-10-CM

## 2021-09-14 DIAGNOSIS — E039 Hypothyroidism, unspecified: Secondary | ICD-10-CM

## 2021-09-14 DIAGNOSIS — I10 Essential (primary) hypertension: Secondary | ICD-10-CM

## 2021-09-14 DIAGNOSIS — F1320 Sedative, hypnotic or anxiolytic dependence, uncomplicated: Secondary | ICD-10-CM

## 2021-09-14 DIAGNOSIS — K219 Gastro-esophageal reflux disease without esophagitis: Secondary | ICD-10-CM | POA: Diagnosis not present

## 2021-09-14 DIAGNOSIS — E1159 Type 2 diabetes mellitus with other circulatory complications: Secondary | ICD-10-CM | POA: Diagnosis not present

## 2021-09-14 DIAGNOSIS — F39 Unspecified mood [affective] disorder: Secondary | ICD-10-CM | POA: Diagnosis not present

## 2021-09-14 DIAGNOSIS — I25110 Atherosclerotic heart disease of native coronary artery with unstable angina pectoris: Secondary | ICD-10-CM

## 2021-09-19 DIAGNOSIS — R110 Nausea: Secondary | ICD-10-CM | POA: Diagnosis not present

## 2021-10-06 NOTE — Progress Notes (Signed)
Suprapubic Cath Change ? ?Patient is present today for a suprapubic catheter change due to urinary retention.  9 ml of water was drained from the balloon, a 16 FR foley cath was removed from the tract with out difficulty.  Site was cleaned and prepped in a sterile fashion with betadine.  A  18 FR foley cath was replaced into the tract no complications were noted. Urine return was noted, 10 ml of sterile water was inflated into the balloon and a leg bag was attached for drainage.  Patient tolerated well. A night bag was given to patient and proper instruction was given on how to switch bags.   ? ?Performed by: Michiel Cowboy, PA-C and Gerarda Gunther, CMA and Lizbeth Bark, CMA ? ?Follow up: One month for SPT exchange. Please attach a leg pain for patient when she is engaged in activity  ? ? ?I,Kailey Littlejohn,acting as a Neurosurgeon for [employer], PA-C.,have documented all relevant documentation on the behalf of Victoria Meiring, PA-C,as directed by  [facility], PA-C while in the presence of Jet Traynham, PA-C. ? ?I have reviewed the above documentation for accuracy and completeness, and I agree with the above.   ? ?Michiel Cowboy, PA-C  ?

## 2021-10-07 ENCOUNTER — Other Ambulatory Visit: Payer: Self-pay

## 2021-10-07 ENCOUNTER — Ambulatory Visit (INDEPENDENT_AMBULATORY_CARE_PROVIDER_SITE_OTHER): Payer: Medicare Other | Admitting: Urology

## 2021-10-07 DIAGNOSIS — Z466 Encounter for fitting and adjustment of urinary device: Secondary | ICD-10-CM

## 2021-10-07 DIAGNOSIS — R339 Retention of urine, unspecified: Secondary | ICD-10-CM | POA: Diagnosis not present

## 2021-10-09 ENCOUNTER — Observation Stay
Admission: EM | Admit: 2021-10-09 | Discharge: 2021-10-09 | Discharge status: Against Medical Advice | Payer: Medicare Other | Attending: Internal Medicine | Admitting: Internal Medicine

## 2021-10-09 ENCOUNTER — Observation Stay: Payer: Medicare Other

## 2021-10-09 ENCOUNTER — Emergency Department: Payer: Medicare Other

## 2021-10-09 ENCOUNTER — Other Ambulatory Visit: Payer: Self-pay

## 2021-10-09 ENCOUNTER — Encounter: Payer: Self-pay | Admitting: Emergency Medicine

## 2021-10-09 DIAGNOSIS — R29818 Other symptoms and signs involving the nervous system: Secondary | ICD-10-CM | POA: Diagnosis not present

## 2021-10-09 DIAGNOSIS — I10 Essential (primary) hypertension: Secondary | ICD-10-CM | POA: Diagnosis not present

## 2021-10-09 DIAGNOSIS — H547 Unspecified visual loss: Secondary | ICD-10-CM | POA: Diagnosis not present

## 2021-10-09 DIAGNOSIS — F411 Generalized anxiety disorder: Secondary | ICD-10-CM | POA: Diagnosis present

## 2021-10-09 DIAGNOSIS — G8114 Spastic hemiplegia affecting left nondominant side: Secondary | ICD-10-CM | POA: Insufficient documentation

## 2021-10-09 DIAGNOSIS — I25110 Atherosclerotic heart disease of native coronary artery with unstable angina pectoris: Secondary | ICD-10-CM | POA: Diagnosis present

## 2021-10-09 DIAGNOSIS — E782 Mixed hyperlipidemia: Secondary | ICD-10-CM | POA: Diagnosis present

## 2021-10-09 DIAGNOSIS — Z7902 Long term (current) use of antithrombotics/antiplatelets: Secondary | ICD-10-CM | POA: Diagnosis not present

## 2021-10-09 DIAGNOSIS — R9431 Abnormal electrocardiogram [ECG] [EKG]: Secondary | ICD-10-CM | POA: Insufficient documentation

## 2021-10-09 DIAGNOSIS — Z9359 Other cystostomy status: Secondary | ICD-10-CM

## 2021-10-09 DIAGNOSIS — E785 Hyperlipidemia, unspecified: Secondary | ICD-10-CM | POA: Diagnosis present

## 2021-10-09 DIAGNOSIS — I2510 Atherosclerotic heart disease of native coronary artery without angina pectoris: Secondary | ICD-10-CM | POA: Diagnosis not present

## 2021-10-09 DIAGNOSIS — Z8673 Personal history of transient ischemic attack (TIA), and cerebral infarction without residual deficits: Secondary | ICD-10-CM | POA: Diagnosis not present

## 2021-10-09 DIAGNOSIS — Z86718 Personal history of other venous thrombosis and embolism: Secondary | ICD-10-CM | POA: Diagnosis not present

## 2021-10-09 DIAGNOSIS — I6782 Cerebral ischemia: Secondary | ICD-10-CM | POA: Diagnosis not present

## 2021-10-09 DIAGNOSIS — G43909 Migraine, unspecified, not intractable, without status migrainosus: Secondary | ICD-10-CM | POA: Diagnosis not present

## 2021-10-09 DIAGNOSIS — R471 Dysarthria and anarthria: Secondary | ICD-10-CM | POA: Diagnosis not present

## 2021-10-09 DIAGNOSIS — I25118 Atherosclerotic heart disease of native coronary artery with other forms of angina pectoris: Secondary | ICD-10-CM | POA: Diagnosis present

## 2021-10-09 DIAGNOSIS — I639 Cerebral infarction, unspecified: Secondary | ICD-10-CM | POA: Insufficient documentation

## 2021-10-09 DIAGNOSIS — R4781 Slurred speech: Secondary | ICD-10-CM | POA: Diagnosis present

## 2021-10-09 DIAGNOSIS — E119 Type 2 diabetes mellitus without complications: Secondary | ICD-10-CM | POA: Diagnosis not present

## 2021-10-09 DIAGNOSIS — E039 Hypothyroidism, unspecified: Secondary | ICD-10-CM | POA: Diagnosis present

## 2021-10-09 DIAGNOSIS — G319 Degenerative disease of nervous system, unspecified: Secondary | ICD-10-CM | POA: Diagnosis not present

## 2021-10-09 DIAGNOSIS — Z20822 Contact with and (suspected) exposure to covid-19: Secondary | ICD-10-CM | POA: Diagnosis not present

## 2021-10-09 DIAGNOSIS — R252 Cramp and spasm: Secondary | ICD-10-CM | POA: Diagnosis not present

## 2021-10-09 DIAGNOSIS — N3001 Acute cystitis with hematuria: Secondary | ICD-10-CM | POA: Insufficient documentation

## 2021-10-09 DIAGNOSIS — R4182 Altered mental status, unspecified: Secondary | ICD-10-CM | POA: Insufficient documentation

## 2021-10-09 DIAGNOSIS — R1310 Dysphagia, unspecified: Secondary | ICD-10-CM | POA: Insufficient documentation

## 2021-10-09 DIAGNOSIS — D649 Anemia, unspecified: Secondary | ICD-10-CM | POA: Insufficient documentation

## 2021-10-09 DIAGNOSIS — Z8719 Personal history of other diseases of the digestive system: Secondary | ICD-10-CM | POA: Diagnosis not present

## 2021-10-09 DIAGNOSIS — D509 Iron deficiency anemia, unspecified: Secondary | ICD-10-CM | POA: Diagnosis present

## 2021-10-09 DIAGNOSIS — H47619 Cortical blindness, unspecified side of brain: Secondary | ICD-10-CM

## 2021-10-09 DIAGNOSIS — R29810 Facial weakness: Secondary | ICD-10-CM | POA: Insufficient documentation

## 2021-10-09 DIAGNOSIS — Z86711 Personal history of pulmonary embolism: Secondary | ICD-10-CM | POA: Diagnosis not present

## 2021-10-09 HISTORY — DX: Personal history of transient ischemic attack (TIA), and cerebral infarction without residual deficits: Z86.73

## 2021-10-09 LAB — URINALYSIS, ROUTINE W REFLEX MICROSCOPIC
Bilirubin Urine: NEGATIVE
Glucose, UA: NEGATIVE mg/dL
Ketones, ur: NEGATIVE mg/dL
Nitrite: POSITIVE — AB
Protein, ur: NEGATIVE mg/dL
Specific Gravity, Urine: 1.002 — ABNORMAL LOW (ref 1.005–1.030)
Squamous Epithelial / HPF: NONE SEEN (ref 0–5)
pH: 6 (ref 5.0–8.0)

## 2021-10-09 LAB — COMPREHENSIVE METABOLIC PANEL
ALT: 8 U/L (ref 0–44)
AST: 15 U/L (ref 15–41)
Albumin: 3.8 g/dL (ref 3.5–5.0)
Alkaline Phosphatase: 79 U/L (ref 38–126)
Anion gap: 8 (ref 5–15)
BUN: 8 mg/dL (ref 6–20)
CO2: 25 mmol/L (ref 22–32)
Calcium: 8.9 mg/dL (ref 8.9–10.3)
Chloride: 99 mmol/L (ref 98–111)
Creatinine, Ser: 0.94 mg/dL (ref 0.44–1.00)
GFR, Estimated: >60 mL/min (ref >60)
Glucose, Bld: 118 mg/dL — ABNORMAL HIGH (ref 70–99)
Potassium: 3.9 mmol/L (ref 3.5–5.1)
Sodium: 132 mmol/L — ABNORMAL LOW (ref 135–145)
Total Bilirubin: 0.5 mg/dL (ref 0.3–1.2)
Total Protein: 7.2 g/dL (ref 6.5–8.1)

## 2021-10-09 LAB — URINE DRUG SCREEN, QUALITATIVE (ARMC ONLY)
Amphetamines, Ur Screen: NOT DETECTED
Barbiturates, Ur Screen: NOT DETECTED
Benzodiazepine, Ur Scrn: POSITIVE — AB
Cannabinoid 50 Ng, Ur ~~LOC~~: NOT DETECTED
Cocaine Metabolite,Ur ~~LOC~~: NOT DETECTED
MDMA (Ecstasy)Ur Screen: NOT DETECTED
Methadone Scn, Ur: NOT DETECTED
Opiate, Ur Screen: NOT DETECTED
Phencyclidine (PCP) Ur S: NOT DETECTED
Tricyclic, Ur Screen: NOT DETECTED

## 2021-10-09 LAB — DIFFERENTIAL
Abs Immature Granulocytes: 0.04 10*3/uL (ref 0.00–0.07)
Basophils Absolute: 0.1 10*3/uL (ref 0.0–0.1)
Basophils Relative: 1 %
Eosinophils Absolute: 0.2 10*3/uL (ref 0.0–0.5)
Eosinophils Relative: 2 %
Immature Granulocytes: 0 %
Lymphocytes Relative: 18 %
Lymphs Abs: 1.8 10*3/uL (ref 0.7–4.0)
Monocytes Absolute: 0.8 10*3/uL (ref 0.1–1.0)
Monocytes Relative: 8 %
Neutro Abs: 7.1 10*3/uL (ref 1.7–7.7)
Neutrophils Relative %: 71 %
Smear Review: INCREASED

## 2021-10-09 LAB — APTT: aPTT: 30 seconds (ref 24–36)

## 2021-10-09 LAB — CBC
HCT: 27.7 % — ABNORMAL LOW (ref 36.0–46.0)
Hemoglobin: 7.8 g/dL — ABNORMAL LOW (ref 12.0–15.0)
MCH: 17.9 pg — ABNORMAL LOW (ref 26.0–34.0)
MCHC: 28.2 g/dL — ABNORMAL LOW (ref 30.0–36.0)
MCV: 63.5 fL — ABNORMAL LOW (ref 80.0–100.0)
Platelets: 576 10*3/uL — ABNORMAL HIGH (ref 150–400)
RBC: 4.36 MIL/uL (ref 3.87–5.11)
RDW: 18.8 % — ABNORMAL HIGH (ref 11.5–15.5)
WBC: 9.9 10*3/uL (ref 4.0–10.5)
nRBC: 0 % (ref 0.0–0.2)

## 2021-10-09 LAB — PROTIME-INR
INR: 1 (ref 0.8–1.2)
Prothrombin Time: 13.1 seconds (ref 11.4–15.2)

## 2021-10-09 LAB — RESP PANEL BY RT-PCR (FLU A&B, COVID) ARPGX2
Influenza A by PCR: NEGATIVE
Influenza B by PCR: NEGATIVE
SARS Coronavirus 2 by RT PCR: NEGATIVE

## 2021-10-09 MED ORDER — SODIUM CHLORIDE 0.9 % IV SOLN
1.0000 g | Freq: Once | INTRAVENOUS | Status: DC
  Administered 2021-10-09: 1 g via INTRAVENOUS
  Filled 2021-10-09: qty 10

## 2021-10-09 MED ORDER — ONDANSETRON HCL 4 MG/2ML IJ SOLN: 4.0000 mg | Freq: Three times a day (TID) | INTRAMUSCULAR | Status: DC | PRN

## 2021-10-09 NOTE — ED Notes (Signed)
Pt refused to sign ama form , pt wants to leave  ?

## 2021-10-09 NOTE — ED Provider Notes (Signed)
[facility] Provider Note    Event Date/Time   First MD Initiated Contact with Patient 10/09/21 [phone]     (approximate)   History   Dysarthria   HPI  Victoria Medina is a 61 y.o. female with a history of CVA with left-sided hemiparesis, migraine headaches, blindness, hyperlipidemia, CAD on Brilinta who presents for evaluation of slurred speech.  Patient reports going to bed around 9:30 PM and being in her usual state of health.  She woke up at 4:30 AM with slurred speech.  She has no difficulty finding words, she denies any changes in her left-sided weakness or any new weaknesses.  She denies headache, recent fall or head trauma.  No chest pain or shortness of breath.     Past Medical History:  Diagnosis Date   Acute ST elevation myocardial infarction (STEMI) of inferolateral wall (HCC) 01/25/2020   Blind    Cholecystitis 04/22/2021   History of migraine headaches    Hyperlipemia    Myocardial infarction [facility])    Stroke [facility])     Past Surgical History:  Procedure Laterality Date   ABDOMINAL HYSTERECTOMY     BACK SURGERY     CARDIAC CATHETERIZATION     CORONARY/GRAFT ACUTE MI REVASCULARIZATION N/A 01/25/2020   Procedure: Coronary/Graft Acute MI Revascularization;  Surgeon: Iran Ouch, MD;  Location: [facility] INVASIVE CV LAB;  Service: Cardiovascular;  Laterality: N/A;   FRACTURE SURGERY     IR CATHETER TUBE CHANGE  08/10/2021   LEFT HEART CATH AND CORONARY ANGIOGRAPHY N/A 01/25/2020   Procedure: LEFT HEART CATH AND CORONARY ANGIOGRAPHY;  Surgeon: Iran Ouch, MD;  Location: [facility] INVASIVE CV LAB;  Service: Cardiovascular;  Laterality: N/A;   SKIN GRAFT Right      Physical Exam   Triage Vital Signs: ED Triage Vitals  Enc Vitals Group     BP      Pulse      Resp      Temp      Temp src      SpO2      Weight      Height      Head Circumference      Peak Flow      Pain Score      Pain Loc      Pain Edu?      Excl. in [place]?     Most  recent vital signs: Vitals:   10/09/21 0528 10/09/21 0535  BP:  (!) 161/64  Pulse:  76  Resp:  17  Temp:  98.2 F (36.8 C)  SpO2: 100% 100%     Constitutional: Alert and oriented. in no apparent distress. HEENT:      Head: Normocephalic and atraumatic.         Eyes: Conjunctivae are normal. Sclera is non-icteric.       Mouth/Throat: Mucous membranes are moist.       Neck: Supple with no signs of meningismus. Cardiovascular: Regular rate and rhythm. No murmurs, gallops, or rubs. 2+ symmetrical distal pulses are present in all extremities.  Respiratory: Normal respiratory effort. Lungs are clear to auscultation bilaterally.  Gastrointestinal: Soft, non tender, and non distended with positive bowel sounds. No rebound or guarding. Genitourinary: No CVA tenderness. Musculoskeletal:  No edema, cyanosis, or erythema of extremities. Neurologic: Slurred speech, left-sided facial droop, left-sided spastic hemiparesis, intact strength on the right upper and lower extremity Skin: Skin is warm, dry and intact. No rash noted. Psychiatric: Mood and  affect are normal. Speech and behavior are normal.  ED Results / Procedures / Treatments   Labs (all labs ordered are listed, but only abnormal results are displayed) Labs Reviewed  CBC - Abnormal; Notable for the following components:      Result Value   Hemoglobin 7.8 (*)    HCT 27.7 (*)    MCV 63.5 (*)    MCH 17.9 (*)    MCHC 28.2 (*)    RDW 18.8 (*)    Platelets 576 (*)    All other components within normal limits  COMPREHENSIVE METABOLIC PANEL - Abnormal; Notable for the following components:   Sodium 132 (*)    Glucose, Bld 118 (*)    All other components within normal limits  URINE DRUG SCREEN, QUALITATIVE ([facility] ONLY) - Abnormal; Notable for the following components:   Benzodiazepine, Ur Scrn POSITIVE (*)    All other components within normal limits  URINALYSIS, ROUTINE W REFLEX MICROSCOPIC - Abnormal; Notable for the following  components:   Color, Urine STRAW (*)    APPearance CLEAR (*)    Specific Gravity, Urine 1.002 (*)    Hgb urine dipstick SMALL (*)    Nitrite POSITIVE (*)    Leukocytes,Ua LARGE (*)    Bacteria, UA FEW (*)    All other components within normal limits  RESP PANEL BY RT-PCR (FLU A&B, COVID) ARPGX2  URINE CULTURE  PROTIME-INR  APTT  DIFFERENTIAL  ETHANOL  PREGNANCY, URINE     EKG  ED ECG REPORT I, Nita Sickle, the attending physician, personally viewed and interpreted this ECG.  EKG has significant artifact due to patient's hemiparesis.  Normal sinus rhythm with a rate of 75 with no ST elevations or depressions   RADIOLOGY I, Nita Sickle, attending MD, have personally viewed and interpreted the images obtained during this visit as below:  CT with no acute finding   ___________________________________________________ Interpretation by Radiologist:  CT HEAD WO CONTRAST  Result Date: 10/09/2021 CLINICAL DATA:  61 year old female with history of dysphagia and slurred speech. Prior history of strokes. EXAM: CT HEAD WITHOUT CONTRAST TECHNIQUE: Contiguous axial images were obtained from the base of the skull through the vertex without intravenous contrast. RADIATION DOSE REDUCTION: This exam was performed according to the departmental dose-optimization program which includes automated exposure control, adjustment of the mA and/or kV according to patient size and/or use of iterative reconstruction technique. COMPARISON:  Head CT 05/28/2016. FINDINGS: Brain: Moderate cerebral and mild cerebellar atrophy. Patchy and confluent areas of decreased attenuation are noted throughout the deep and periventricular white matter of the cerebral hemispheres bilaterally, compatible with chronic microvascular ischemic disease. Extensive low attenuation regions in the medial aspects of the occipital regions bilaterally, where there is substantial volume loss and cortical atrophy, compatible with  encephalomalacia from remote infarcts. No evidence of acute infarction, hemorrhage, hydrocephalus, extra-axial collection or mass lesion/mass effect. Vascular: No hyperdense vessel or unexpected calcification. Skull: Normal. Negative for fracture or focal lesion. Sinuses/Orbits: No acute finding. Other: None. IMPRESSION: 1. No acute intracranial abnormalities. 2. Moderate cerebral and mild cerebellar atrophy with extensive chronic microvascular ischemic changes in the cerebral white matter, and old bilateral occipital infarcts, similar to the prior examination. Electronically Signed   By: Trudie Reed M.D.   On: 10/09/2021 06:06      PROCEDURES:  Critical Care performed: No  Procedures    IMPRESSION / MDM / ASSESSMENT AND PLAN / ED COURSE  I reviewed the triage vital signs and the nursing notes.  61 y.o. female with a history of CVA with left-sided hemiparesis, migraine headaches, blindness, hyperlipidemia, CAD on Brilinta who presents for evaluation of slurred speech.  With new onset of slurred speech.  Last seen normal was 9:30 PM last night when she went to bed.  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  She does have new slurred speech.  Ddx: Stroke versus brain bleed versus TIA versus reactivation of old stroke   Plan: CT head, labs including CBC, chemistry panel, coags, ethanol, UDS, urinalysis, COVID, EKG.  Will place patient on telemetry for close monitoring.   MEDICATIONS GIVEN IN ED: Medications  cefTRIAXone (ROCEPHIN) 1 g in sodium chloride 0.9 % 100 mL IVPB (1 g Intravenous New Bag/Given 10/09/21 0620)     ED COURSE: UA positive for UTI, patient was given Rocephin.  Culture has been sent.  CT head showing no acute findings.  CBC showing stable chronic anemia with a hemoglobin of 7.8, no signs of sepsis with no fever or leukocytosis.  No significant electrolyte derangements or  signs of AKI.  MRI has been ordered.  Hospitalist has been consulted and after discussion has accepted patient to their service for stroke evaluation   Consults: Hospitalist   EMR reviewed including records from patient's last admission to the hospital from September 2022 for cholecystitis    FINAL CLINICAL IMPRESSION(S) / ED DIAGNOSES   Final diagnoses:  Acute cystitis with hematuria  Slurred speech     Rx / DC Orders   ED Discharge Orders     None        Note:  This document was prepared using Dragon voice recognition software and may include unintentional dictation errors.   Please note:  Patient was evaluated in Emergency Department today for the symptoms described in the history of present illness. Patient was evaluated in the context of the global COVID-19 pandemic, which necessitated consideration that the patient might be at risk for infection with the SARS-CoV-2 virus that causes COVID-19. Institutional protocols and algorithms that pertain to the evaluation of patients at risk for COVID-19 are in a state of rapid change based on information released by regulatory bodies including the CDC and federal and state organizations. These policies and algorithms were followed during the patient's care in the ED.  Some ED evaluations and interventions may be delayed as a result of limited staffing during the pandemic.       Don Perking, [place], MD 10/09/21 [phone]

## 2021-10-09 NOTE — Progress Notes (Signed)
? ?  This is a no charge note ? 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  CT of the head is negative for acute intracranial abnormalities.  MRI of brain showed acute small vessel infarct in the left corona radiata. ? ?Patient needed to be admitted for stroke work-up with neurology consult, however patient strongly refused admission.  I highly recommended her to stay in the hospital, and discussed with her twice, but patient still refused admission. Dr. Cyril Loosen of ED also discussed with patient and has tried to convince patient to stay in the hospital, but the patient is adamantly refusing and wants to leave AMA.  I explained the risk of leaving hospital at current condition, she fully understands that this could lead to permanent disability or even death.  She accepts this risk.  Patient will be leaving hospital AMA.  Admission is canceled. ? ? ?[name], MD ? ?[facility] ? ? ?If 7PM-7AM, please contact night-coverage ?[email or web address] ?10/09/2021, 9:10 AM ? ?

## 2021-10-09 NOTE — ED Triage Notes (Signed)
Pt arrives via [facility].  C/O dysphasia, slurring beginning at 0430.  LKW 2130 last night.  Pt has pre-existing left sided deficits d/t previous strokes. Pt denies pain/SHOB/numbness. ?

## 2021-10-09 NOTE — ED Provider Notes (Signed)
Patient is refusing admission.  Myself the hospitalist and the nurse discussed with the patient that her MRI demonstrates an acute stroke that we would strongly recommend staying in the hospital.  She is adamantly refusing and wants to leave AMA.  She does have decisional capacity and she understands that this could lead to permanent disability and/or death and she accepts this risk ?  ?Victoria Every, MD ?10/09/21 [phone] ? ?

## 2021-10-09 NOTE — Discharge Instructions (Addendum)
Your MRI showed a stroke, we recommended admission however you refused and wanted to leave AMA, you can return at any time ?

## 2021-10-11 DIAGNOSIS — Z8673 Personal history of transient ischemic attack (TIA), and cerebral infarction without residual deficits: Secondary | ICD-10-CM | POA: Diagnosis not present

## 2021-10-11 LAB — URINE CULTURE: Culture: >=100000 — AB

## 2021-11-08 NOTE — Progress Notes (Signed)
Suprapubic Cath Change ? ?Patient is present today for a suprapubic catheter change due to urinary retention.  9 ml of water was drained from the balloon, a 18 FR foley cath was removed from the tract with out difficulty.  Site was cleaned and prepped in a sterile fashion with betadine.  A 18 Coude FR foley cath (that was what was given to me) was replaced into the tract no complications were noted. Urine return was noted, 10 ml of sterile water was inflated into the balloon and a leg bag was attached for drainage.  Patient tolerated well. A night bag was given to patient and proper instruction was given on how to switch bags.   ? ?Performed by: Michiel Cowboy, PA-C  ? ?Follow up: One month for SPT exchange   ?

## 2021-11-09 ENCOUNTER — Ambulatory Visit (INDEPENDENT_AMBULATORY_CARE_PROVIDER_SITE_OTHER): Payer: Medicare Other | Admitting: Urology

## 2021-11-09 DIAGNOSIS — Z466 Encounter for fitting and adjustment of urinary device: Secondary | ICD-10-CM

## 2021-11-17 ENCOUNTER — Ambulatory Visit (INDEPENDENT_AMBULATORY_CARE_PROVIDER_SITE_OTHER): Payer: Medicare Other | Admitting: Urology

## 2021-11-17 DIAGNOSIS — T8384XA Pain from genitourinary prosthetic devices, implants and grafts, initial encounter: Secondary | ICD-10-CM | POA: Diagnosis not present

## 2021-11-17 DIAGNOSIS — Z466 Encounter for fitting and adjustment of urinary device: Secondary | ICD-10-CM

## 2021-11-17 DIAGNOSIS — N319 Neuromuscular dysfunction of bladder, unspecified: Secondary | ICD-10-CM | POA: Diagnosis not present

## 2021-11-17 DIAGNOSIS — I639 Cerebral infarction, unspecified: Secondary | ICD-10-CM

## 2021-11-17 DIAGNOSIS — R339 Retention of urine, unspecified: Secondary | ICD-10-CM | POA: Diagnosis not present

## 2021-11-17 DIAGNOSIS — R301 Vesical tenesmus: Secondary | ICD-10-CM

## 2021-11-17 NOTE — Progress Notes (Signed)
11/23/21 ?8:17 AM  ? ?Victoria Medina ?Jun 15, 1961 ?[id number] ? ?Referring provider:  ?Venia Carbon, MD ?[address] ?[place],  [place] [address] ?Chief Complaint  ?Patient presents with  ? Other  ? ? ?Urological history  ?Neurogenic bladder  ?- Managed with SPT ? ?HPI: ?Victoria Medina is a 61 y.o.female who presents today for further evaluation SPT pain. ? ?She is accompanied by [facility] staff [facility]. She is very emotional today she is having pain in SPT area.  She wants the suprapubic tube to be removed. She reports that her pain got worse when her SPT was exchanged last week.  ? ?Patient denies any gross hematuria, dysuria or suprapubic/flank pain.  Patient denies any fevers, chills, nausea or vomiting.  ? ?PMH: ?Past Medical History:  ?Diagnosis Date  ? Acute ST elevation myocardial infarction (STEMI) of inferolateral wall ([place]) 01/25/2020  ? Blind   ? Cholecystitis 04/22/2021  ? History of migraine headaches   ? Hyperlipemia   ? Myocardial infarction [facility])   ? Stroke [facility])   ? ? ?Surgical History: ?Past Surgical History:  ?Procedure Laterality Date  ? ABDOMINAL HYSTERECTOMY    ? BACK SURGERY    ? CARDIAC CATHETERIZATION    ? CORONARY/GRAFT ACUTE MI REVASCULARIZATION N/A 01/25/2020  ? Procedure: Coronary/Graft Acute MI Revascularization;  Surgeon: Wellington Hampshire, MD;  Location: [facility];  Service: Cardiovascular;  Laterality: N/A;  ? FRACTURE SURGERY    ? IR CATHETER TUBE CHANGE  08/10/2021  ? LEFT HEART CATH AND CORONARY ANGIOGRAPHY N/A 01/25/2020  ? Procedure: LEFT HEART CATH AND CORONARY ANGIOGRAPHY;  Surgeon: Wellington Hampshire, MD;  Location: [facility];  Service: Cardiovascular;  Laterality: N/A;  ? SKIN GRAFT Right   ? ? ?Home Medications:  ?Allergies as of 11/17/2021   ? ?   Reactions  ? Levaquin [levofloxacin In D5w] Anaphylaxis, Swelling  ? Iodinated Contrast Media Itching  ? Severe itching  ? Other   ? Dust mites and opiates (all of them)  ? Latex Dermatitis  ? ?  ? ?   ?Medication List  ?  ? ?  ? Accurate as of November 17, 2021 11:59 PM. If you have any questions, ask your nurse or doctor.  ?  ?  ? ?  ? ?aspirin EC 81 MG tablet ?Take 81 mg by mouth daily. Swallow whole. ?  ?Azelastine HCl 0.15 % Soln ?Place 2 sprays into both nostrils 2 (two) times daily. ?  ?bisacodyl 10 MG suppository ?Commonly known as: DULCOLAX ?Place 10 mg rectally as needed for moderate constipation. ?  ?clonazePAM 0.5 MG tablet ?Commonly known as: KLONOPIN ?Take 0.5 mg by mouth at bedtime. ?  ?diazepam 10 MG tablet ?Commonly known as: VALIUM ?Take 10 mg by mouth every 6 (six) hours as needed for anxiety. ?  ?guaiFENesin 100 MG/5ML liquid ?Commonly known as: ROBITUSSIN ?Take 5 mLs by mouth every 4 (four) hours as needed for cough or to loosen phlegm. ?  ?ibuprofen 800 MG tablet ?Commonly known as: ADVIL ?Take 800 mg by mouth 3 (three) times daily. ?  ?lansoprazole 15 MG capsule ?Commonly known as: PREVACID ?Take 15 mg by mouth daily at 12 noon. ?  ?levothyroxine 25 MCG tablet ?Commonly known as: SYNTHROID ?Take 25 mcg by mouth daily before breakfast. ?  ?magnesium citrate Soln ?Take 5 mLs by mouth as needed for severe constipation. ?  ?magnesium hydroxide 400 MG/5ML suspension ?Commonly known as: MILK OF MAGNESIA ?Take 5 mLs by  mouth daily as needed for mild constipation. ?  ?metFORMIN 500 MG tablet ?Commonly known as: GLUCOPHAGE ?Take 500 mg by mouth 2 (two) times daily. ?  ?nitroGLYCERIN 0.4 MG SL tablet ?Commonly known as: NITROSTAT ?Place 0.4 mg under the tongue every 5 (five) minutes x 3 doses as needed for chest pain. ?  ?nystatin-triamcinolone ointment ?Commonly known as: MYCOLOG ?Apply 1 application topically 2 (two) times daily. ?  ?ondansetron 4 MG disintegrating tablet ?Commonly known as: ZOFRAN-ODT ?Take 4 mg by mouth every 8 (eight) hours as needed for nausea or vomiting. ?  ?oxybutynin 5 MG tablet ?Commonly known as: DITROPAN ?Take 1 tablet (5 mg total) by mouth 2 (two) times daily. ?  ?Premarin  vaginal cream ?Generic drug: conjugated estrogens ?Apply 0.5mg  (pea-sized amount)  just inside the vaginal introitus with a finger-tip on  Monday, Wednesday and Friday nights. ?  ?promethazine 25 MG/ML injection ?Commonly known as: PHENERGAN ?Inject 25 mg into the vein every 6 (six) hours as needed for nausea or vomiting. ?  ?sennosides-docusate sodium 8.6-50 MG tablet ?Commonly known as: SENOKOT-S ?Take 1 tablet by mouth in the morning and at bedtime. ?  ?ticagrelor 60 MG Tabs tablet ?Commonly known as: BRILINTA ?Take 1 tablet by mouth 2 (two) times daily. ?  ?tiZANidine 2 MG tablet ?Commonly known as: ZANAFLEX ?Take 2 mg by mouth 3 (three) times daily as needed for muscle spasms. ?  ?Vagisil 1 % Crea ?Generic drug: Hydrocortisone Acetate ?Apply topically. ?  ?Vitamin D (Ergocalciferol) 1.25 MG (50000 UNIT) Caps capsule ?Commonly known as: DRISDOL ?Take 50,000 Units by mouth every Monday. ?  ? ?  ? ? ?Allergies:  ?Allergies  ?Allergen Reactions  ? Levaquin [Levofloxacin In D5w] Anaphylaxis and Swelling  ? Iodinated Contrast Media Itching  ?  Severe itching  ? Other   ?  Dust mites and opiates (all of them)  ? Latex Dermatitis  ? ? ?Family History: ?Family History  ?Problem Relation Age of Onset  ? Heart attack Mother   ? Hypertension Mother   ? Hypercholesterolemia Mother   ? Diabetes Mother   ? Stroke Father   ? Heart attack Father   ? Hypertension Father   ? Hypercholesterolemia Father   ? Diabetes Father   ? Diabetes Maternal Grandmother   ? Cancer - Other Maternal Grandmother   ? ? ?Social History:  reports that she has never smoked. She has never used smokeless tobacco. She reports current alcohol use. She reports that she does not use drugs. ? ? ?Physical Exam: ?Constitutional:  Alert and oriented, No acute distress. ?HEENT: [place] AT, moist mucus membranes.  Trachea midline, no masses. ?Cardiovascular: No clubbing, cyanosis, or edema. ?Respiratory: Normal respiratory effort, no increased work of  breathing. ?Neurologic: Grossly intact, no focal deficits, moving all 4 extremities. ?Psychiatric: Normal mood and affect. ? ?Laboratory Data: ?Lab Results  ?Component Value Date  ? CREATININE 0.94 10/09/2021  ? ? ?Suprapubic Cath Change ? ?Patient is present today for a suprapubic catheter change due to urinary retention.  25ml of water was drained from the balloon, a 18 FR foley cath was removed from the tract with out difficulty.  Site was cleaned and prepped in a sterile fashion with betadine.  A 20 FR foley cath was replaced into the tract no complications were noted. Urine return was noted, 10 ml of sterile water was inflated into the balloon and a  bag was attached for drainage.  Patient tolerated well. A night bag was given to  patient and proper instruction was given on how to switch bags.   ? ?Assessment & Plan:   ?SPT pain  ?- Ongoing since exchange last week  ?- She was offered exchange to 84 FR today she is interested. Catheter was placed.  ? ?No follow-ups on file. ? ?[facility] ?[address], [address] ?[place], [place] [address] ?[phone] ? ?I,Kailey Littlejohn,acting as a Education administrator for [employer], PA-C.,have documented all relevant documentation on the behalf of [place], PA-C,as directed by  [facility], PA-C while in the presence of Laura, PA-C. ? ?I have reviewed the above documentation for accuracy and completeness, and I agree with the above.   ? ?Zara Council, PA-C  ? ?I spent 15 minutes on the day of the encounter to include pre-visit record review, face-to-face time with the patient, and post-visit ordering of tests.  ?

## 2021-11-22 DIAGNOSIS — K219 Gastro-esophageal reflux disease without esophagitis: Secondary | ICD-10-CM

## 2021-11-22 DIAGNOSIS — F39 Unspecified mood [affective] disorder: Secondary | ICD-10-CM

## 2021-11-22 DIAGNOSIS — I25119 Atherosclerotic heart disease of native coronary artery with unspecified angina pectoris: Secondary | ICD-10-CM

## 2021-11-22 DIAGNOSIS — I5022 Chronic systolic (congestive) heart failure: Secondary | ICD-10-CM

## 2021-11-22 DIAGNOSIS — G8194 Hemiplegia, unspecified affecting left nondominant side: Secondary | ICD-10-CM | POA: Diagnosis not present

## 2021-11-22 DIAGNOSIS — E1159 Type 2 diabetes mellitus with other circulatory complications: Secondary | ICD-10-CM

## 2021-11-23 ENCOUNTER — Encounter: Payer: Self-pay | Admitting: Urology

## 2021-11-26 ENCOUNTER — Emergency Department
Admission: EM | Admit: 2021-11-26 | Discharge: 2021-11-26 | Disposition: A | Discharge status: Skilled Nursing Facility | Payer: Medicare Other | Attending: Emergency Medicine | Admitting: Emergency Medicine

## 2021-11-26 ENCOUNTER — Other Ambulatory Visit: Payer: Self-pay

## 2021-11-26 ENCOUNTER — Encounter: Payer: Self-pay | Admitting: Emergency Medicine

## 2021-11-26 DIAGNOSIS — Z951 Presence of aortocoronary bypass graft: Secondary | ICD-10-CM | POA: Diagnosis not present

## 2021-11-26 DIAGNOSIS — T83198A Other mechanical complication of other urinary devices and implants, initial encounter: Secondary | ICD-10-CM | POA: Insufficient documentation

## 2021-11-26 DIAGNOSIS — R109 Unspecified abdominal pain: Secondary | ICD-10-CM | POA: Diagnosis present

## 2021-11-26 DIAGNOSIS — T83090A Other mechanical complication of cystostomy catheter, initial encounter: Secondary | ICD-10-CM

## 2021-11-26 DIAGNOSIS — Y732 Prosthetic and other implants, materials and accessory gastroenterology and urology devices associated with adverse incidents: Secondary | ICD-10-CM | POA: Diagnosis not present

## 2021-11-26 NOTE — ED Notes (Signed)
Number for [facility] to call report and for ride [phone] ?

## 2021-11-26 NOTE — ED Notes (Signed)
Report given and care endorsed to oncoming shift 

## 2021-11-26 NOTE — ED Notes (Signed)
Pt refused v/s..

## 2021-11-26 NOTE — ED Triage Notes (Signed)
Pt reports she has an indwelling catheter, reports it was changed a few ago. Pt reports feeling that catheter is not draining properly, reports having low abdominal pressure. RN attempted to look at abdomen pt verbalized that she does not want RN to touch her and RN explained the need to do a bladder scan pt refuses. Pt did allow [place] to take VS. Pt reports " I don't have an infection just feel like catheter is not draining right.  ?

## 2021-11-26 NOTE — ED Provider Notes (Signed)
? ?[facility] ?Provider Note ? ? ? Event Date/Time  ? First MD Initiated Contact with Patient 11/26/21 0445   ?  (approximate) ? ? ?History  ? ?Abdominal Pain ? ? ?HPI ? ?Victoria Medina is a 61 y.o. female who presents for an obstructed suprapubic catheter.  Patient reports that it was last exchanged 1 week ago.  Since then she has noticed some leaking around the catheter and then today she noticed that the catheter is no longer fully empty her bladder.  She denies abdominal pain, vomiting, nausea, fever or chills.  She has noticed some leakage of urine through her urethra. ?  ? ? ?Past Medical History:  ?Diagnosis Date  ? Acute ST elevation myocardial infarction (STEMI) of inferolateral wall ([facility]) 01/25/2020  ? Blind   ? Cholecystitis 04/22/2021  ? History of migraine headaches   ? Hyperlipemia   ? Myocardial infarction [facility])   ? Stroke [facility])   ? ? ?Past Surgical History:  ?Procedure Laterality Date  ? ABDOMINAL HYSTERECTOMY    ? BACK SURGERY    ? CARDIAC CATHETERIZATION    ? CORONARY/GRAFT ACUTE MI REVASCULARIZATION N/A 01/25/2020  ? Procedure: Coronary/Graft Acute MI Revascularization;  Surgeon: Iran Ouch, MD;  Location: [facility];  Service: Cardiovascular;  Laterality: N/A;  ? FRACTURE SURGERY    ? IR CATHETER TUBE CHANGE  08/10/2021  ? LEFT HEART CATH AND CORONARY ANGIOGRAPHY N/A 01/25/2020  ? Procedure: LEFT HEART CATH AND CORONARY ANGIOGRAPHY;  Surgeon: Iran Ouch, MD;  Location: [facility];  Service: Cardiovascular;  Laterality: N/A;  ? SKIN GRAFT Right   ? ? ? ?Physical Exam  ? ?Triage Vital Signs: ?ED Triage Vitals  ?Enc Vitals Group  ?   BP --   ?   Pulse --   ?   Resp --   ?   Temp --   ?   Temp src --   ?   SpO2 --   ?   Weight 11/26/21 0209 200 lb (90.7 kg)  ?   Height 11/26/21 0209 5\' 1"  (1.549 m)  ?   Head Circumference --   ?   Peak Flow --   ?   Pain Score 11/26/21 0208 10  ?   Pain Loc --   ?   Pain Edu? --   ?   Excl. in GC? --   ? ? ?Most  recent vital signs: ?There were no vitals filed for this visit. ? ? ?Constitutional: Alert and oriented. Well appearing and in no apparent distress. ?HEENT: ?     Head: Normocephalic and atraumatic.    ?     Eyes: Conjunctivae are normal. Sclera is non-icteric.  ?     Mouth/Throat: Mucous membranes are moist.  ?     Neck: Supple with no signs of meningismus. ?Cardiovascular: Regular rate and rhythm.  ?Respiratory: Normal respiratory effort.  ?Gastrointestinal: Soft, non tender, and non distended with positive bowel sounds. No rebound or guarding. ?Genitourinary: Suprapubic Foley catheter in place ?Musculoskeletal:  No edema, cyanosis, or erythema of extremities. ?Skin: Skin is warm, dry and intact. No rash noted. ?Psychiatric: Mood and affect are normal. Speech and behavior are normal. ? ?ED Results / Procedures / Treatments  ? ?Labs ?(all labs ordered are listed, but only abnormal results are displayed) ?Labs Reviewed - No data to display ? ? ?EKG ? ?none ? ? ?RADIOLOGY ?none ? ? ?PROCEDURES: ? ?Critical Care performed: No ? ?  SUPRAPUBIC TUBE PLACEMENT ? ?Date/Time: 11/26/2021 5:47 AM ?Performed by: Nita Sickle, MD ?Authorized by: Nita Sickle, MD  ? ?Consent:  ?  Consent obtained:  Verbal ?  Consent given by:  Patient ?  Risks discussed:  Bleeding, bowel perforation, infection and pain ?  Alternatives discussed:  Delayed treatment ?Procedure details:  ?  Complexity:  Simple ?  Catheter type:  Foley ?  Catheter size:  20 Fr ?  Number of attempts:  1 ?  Urine characteristics:  Yellow ?Post-procedure details:  ?  Procedure completion:  Tolerated well, no immediate complications ? ? ? ? ?IMPRESSION / MDM / ASSESSMENT AND PLAN / ED COURSE  ?I reviewed the triage vital signs and the nursing notes. ? ?61 y.o. female who presents for an obstructed suprapubic catheter.  Bladder scan showing 350 cc concerning for an obstructed catheter.  A new 20 French suprapubic catheter was placed with return of urine.   Bladder was empty appropriately.  Patient denies any fever or chills.  Procedure was then sterilely.  No antibiotics indicated.  Recommended close follow-up with her urologist.  Discussed Foley catheter care ? ?MEDICATIONS GIVEN IN ED: ?Medications - No data to display ? ? ? ?EMR reviewed including last visit with her urologist from 9 days ago. ? ? ? ?FINAL CLINICAL IMPRESSION(S) / ED DIAGNOSES  ? ?Final diagnoses:  ?Obstruction of suprapubic catheter, initial encounter ([facility])  ? ? ? ?Rx / DC Orders  ? ?ED Discharge Orders   ? ? None  ? ?  ? ? ? ?Note:  This document was prepared using Dragon voice recognition software and may include unintentional dictation errors. ? ? ?Please note:  Patient was evaluated in Emergency Department today for the symptoms described in the history of present illness. Patient was evaluated in the context of the global COVID-19 pandemic, which necessitated consideration that the patient might be at risk for infection with the SARS-CoV-2 virus that causes COVID-19. Institutional protocols and algorithms that pertain to the evaluation of patients at risk for COVID-19 are in a state of rapid change based on information released by regulatory bodies including the CDC and federal and state organizations. These policies and algorithms were followed during the patient's care in the ED.  Some ED evaluations and interventions may be delayed as a result of limited staffing during the pandemic. ? ? ? ? ?  ?Nita Sickle, MD ?11/26/21 [phone] ? ?

## 2021-11-26 NOTE — Discharge Instructions (Signed)

## 2021-11-26 NOTE — ED Notes (Signed)
Provider at bedside to change suprapubic cath at this time. Pt refuses repeat vital signs or monitoring. Educated on benefits of, however pt continues to refuse.  ?

## 2021-11-29 ENCOUNTER — Telehealth: Payer: Self-pay | Admitting: *Deleted

## 2021-11-29 NOTE — Telephone Encounter (Signed)
[facility] called and left a message on voice mail staying that the patient want to have her suprapubic cath out.  I tried to call the number back and no answer and no voice mail . Patient has a appt on 12/07/2022 with shannon .  ? ?

## 2021-12-01 ENCOUNTER — Telehealth: Payer: Self-pay | Admitting: *Deleted

## 2021-12-01 NOTE — Telephone Encounter (Signed)
Nurse from [facility] calling stating that pt is complaining and picking at her SPT and has irritated it. Nurse states she told pt that she has to have a catheter whether it be indwelling or SPT. Pt doesn't want either, nurse asking if someone could talk with patient and reiterate that she needs a catheter. Pt is getting 800mg  of motrin every 8 hrs, pt is wanting dilaudid and the dr will NOT give to pt.  ?

## 2021-12-01 NOTE — Telephone Encounter (Signed)
Nurse Sheryl states that the facility can do in and out cath's. She reports that pt used to do in and out cath's regularly before she had foley cath placed about 3 years ago. Advised Sheryl Victoria Medina is due to come in for an appt next week and Larene Beach would talk with her ab it then.  ?

## 2021-12-05 NOTE — Progress Notes (Signed)
12/06/2021 3:07 PM   Victoria Medina 07/15/1961 [id number]  Referring provider: Karie Schwalbe, MD [address] [place],  [place] [address]   Urological history: 1. Neurogenic bladder -contributing factors of strokes and diabetes -RUS 01/2021 - NED -cysto 05/2021 - NED  -managed with a SPT  Chief Complaint  Patient presents with   Urinary Retention    HPI: Victoria Medina is a 61 y.o. female who presents today to discuss her SPT.  The nursing staff at her SNF had called last week stating that Mrs. Victoria Medina was demanding that the SPT be taken out.  We did contact the facility and they are capable of performing in and out catheterizations.  When I spoke with Victoria Medina, she stated that she did not want the suprapubic taken out and that it does not cause her discomfort.  She said it is the tubing of the suprapubic tube that bothers her and she wants a leg bag.  I spoke with Victoria Medina at the facility and they stated the reason they do not give the leg bag is because the patient lays in bed with her knees up and therefore does not allow the leg bag to drain and they need to change it to an overnight bag for her safety.  Victoria Medina denied that she lays in bed with her knees up.    PMH: Past Medical History:  Diagnosis Date   Acute ST elevation myocardial infarction (STEMI) of inferolateral wall (HCC) 01/25/2020   Blind    Cholecystitis 04/22/2021   History of migraine headaches    Hyperlipemia    Myocardial infarction [facility])    Stroke [facility])     Surgical History: Past Surgical History:  Procedure Laterality Date   ABDOMINAL HYSTERECTOMY     BACK SURGERY     CARDIAC CATHETERIZATION     CORONARY/GRAFT ACUTE MI REVASCULARIZATION N/A 01/25/2020   Procedure: Coronary/Graft Acute MI Revascularization;  Surgeon: Iran Ouch, MD;  Location: [facility] INVASIVE CV LAB;  Service: Cardiovascular;  Laterality: N/A;   FRACTURE SURGERY     IR CATHETER TUBE CHANGE  08/10/2021   LEFT HEART  CATH AND CORONARY ANGIOGRAPHY N/A 01/25/2020   Procedure: LEFT HEART CATH AND CORONARY ANGIOGRAPHY;  Surgeon: Iran Ouch, MD;  Location: [facility] INVASIVE CV LAB;  Service: Cardiovascular;  Laterality: N/A;   SKIN GRAFT Right     Home Medications:  Allergies as of 12/06/2021       Reactions   Levaquin [levofloxacin In D5w] Anaphylaxis, Swelling   Iodinated Contrast Media Itching   Severe itching   Other    Dust mites and opiates (all of them)   Latex Dermatitis        Medication List        Accurate as of Dec 06, 2021  3:07 PM. If you have any questions, ask your nurse or doctor.          aspirin EC 81 MG tablet Take 81 mg by mouth daily. Swallow whole.   Azelastine HCl 0.15 % Soln Place 2 sprays into both nostrils 2 (two) times daily.   bisacodyl 10 MG suppository Commonly known as: DULCOLAX Place 10 mg rectally as needed for moderate constipation.   clonazePAM 0.5 MG tablet Commonly known as: KLONOPIN Take 0.5 mg by mouth at bedtime.   diazepam 10 MG tablet Commonly known as: VALIUM Take 10 mg by mouth every 6 (six) hours as needed for anxiety.   guaiFENesin 100 MG/5ML liquid Commonly  known as: ROBITUSSIN Take 5 mLs by mouth every 4 (four) hours as needed for cough or to loosen phlegm.   ibuprofen 800 MG tablet Commonly known as: ADVIL Take 800 mg by mouth 3 (three) times daily.   lansoprazole 15 MG capsule Commonly known as: PREVACID Take 15 mg by mouth daily at 12 noon.   levothyroxine 25 MCG tablet Commonly known as: SYNTHROID Take 25 mcg by mouth daily before breakfast.   magnesium citrate Soln Take 5 mLs by mouth as needed for severe constipation.   magnesium hydroxide 400 MG/5ML suspension Commonly known as: MILK OF MAGNESIA Take 5 mLs by mouth daily as needed for mild constipation.   metFORMIN 500 MG tablet Commonly known as: GLUCOPHAGE Take 500 mg by mouth 2 (two) times daily.   nitroGLYCERIN 0.4 MG SL tablet Commonly known as:  NITROSTAT Place 0.4 mg under the tongue every 5 (five) minutes x 3 doses as needed for chest pain.   nystatin-triamcinolone ointment Commonly known as: MYCOLOG Apply 1 application topically 2 (two) times daily.   ondansetron 4 MG disintegrating tablet Commonly known as: ZOFRAN-ODT Take 4 mg by mouth every 8 (eight) hours as needed for nausea or vomiting.   oxybutynin 5 MG tablet Commonly known as: DITROPAN Take 1 tablet (5 mg total) by mouth 2 (two) times daily.   Premarin vaginal cream Generic drug: conjugated estrogens Apply 0.5mg  (pea-sized amount)  just inside the vaginal introitus with a finger-tip on  Monday, Wednesday and Friday nights.   promethazine 25 MG/ML injection Commonly known as: PHENERGAN Inject 25 mg into the vein every 6 (six) hours as needed for nausea or vomiting.   sennosides-docusate sodium 8.6-50 MG tablet Commonly known as: SENOKOT-S Take 1 tablet by mouth in the morning and at bedtime.   ticagrelor 60 MG Tabs tablet Commonly known as: BRILINTA Take 1 tablet by mouth 2 (two) times daily.   tiZANidine 2 MG tablet Commonly known as: ZANAFLEX Take 2 mg by mouth 3 (three) times daily as needed for muscle spasms.   Vagisil 1 % Crea Generic drug: Hydrocortisone Acetate Apply topically.   Vitamin D (Ergocalciferol) 1.25 MG (50000 UNIT) Caps capsule Commonly known as: DRISDOL Take 50,000 Units by mouth every Monday.        Allergies:  Allergies  Allergen Reactions   Levaquin [Levofloxacin In D5w] Anaphylaxis and Swelling   Iodinated Contrast Media Itching    Severe itching   Other     Dust mites and opiates (all of them)   Latex Dermatitis    Family History: Family History  Problem Relation Age of Onset   Heart attack Mother    Hypertension Mother    Hypercholesterolemia Mother    Diabetes Mother    Stroke Father    Heart attack Father    Hypertension Father    Hypercholesterolemia Father    Diabetes Father    Diabetes Maternal  Grandmother    Cancer - Other Maternal Grandmother     Social History:  reports that she has never smoked. She has never used smokeless tobacco. She reports current alcohol use. She reports that she does not use drugs.  ROS: Pertinent ROS in HPI  Physical Exam: Constitutional:  Well nourished. Alert and oriented, No acute distress. HEENT: [place] AT, moist mucus membranes.  Trachea midline, no masses. Cardiovascular: No clubbing, cyanosis, or edema. Respiratory: Normal respiratory effort, no increased work of breathing. Neurologic: Grossly intact, no focal deficits, moving all 4 extremities. Psychiatric: Normal mood and affect.  Laboratory Data: Lab Results  Component Value Date   WBC 9.9 10/09/2021   HGB 7.8 (L) 10/09/2021   HCT 27.7 (L) 10/09/2021   MCV 63.5 (L) 10/09/2021   PLT 576 (H) 10/09/2021    Lab Results  Component Value Date   CREATININE 0.94 10/09/2021   Lab Results  Component Value Date   AST 15 10/09/2021   Lab Results  Component Value Date   ALT 8 10/09/2021    Urinalysis    Component Value Date/Time   COLORURINE STRAW (A) 10/09/2021 0530   APPEARANCEUR CLEAR (A) 10/09/2021 0530   APPEARANCEUR Cloudy (A) 05/17/2020 1136   LABSPEC 1.002 (L) 10/09/2021 0530   LABSPEC 1.026 08/05/2014 1906   PHURINE 6.0 10/09/2021 0530   GLUCOSEU NEGATIVE 10/09/2021 0530   GLUCOSEU NEGATIVE 08/05/2014 1906   HGBUR SMALL (A) 10/09/2021 0530   BILIRUBINUR NEGATIVE 10/09/2021 0530   BILIRUBINUR Negative 05/17/2020 1136   BILIRUBINUR NEGATIVE 08/05/2014 1906   KETONESUR NEGATIVE 10/09/2021 0530   PROTEINUR NEGATIVE 10/09/2021 0530   NITRITE POSITIVE (A) 10/09/2021 0530   LEUKOCYTESUR LARGE (A) 10/09/2021 0530   LEUKOCYTESUR 2+ 08/05/2014 1906  I have reviewed the labs.   Pertinent Imaging: No recent urological imaging  Suprapubic Cath Change  Patient is present today for a suprapubic catheter change due to urinary retention.  28 ml of water was drained from the  balloon, a 20 FR foley cath was removed from the tract with out difficulty.  Site was cleaned and prepped in a sterile fashion with betadine.  A 18 FR Silastic foley cath was replaced into the tract no complications were noted. Urine return was noted, 10 ml of sterile water was inflated into the balloon and a night bag was attached for drainage.  Patient tolerated well.   Performed by: Michiel Cowboy, PA-C and Crysta S. Gloris Manchester, CMA   Assessment & Plan:    1. Neurogenic bladder -Unfortunately the staff at her facility states that she is mostly nonambulatory, keeps her knees up not allowing a leg bag to drain and Mrs. Victoria Medina denies that this is happening stating that she does walk a lot and lays completely supine in bed -we switched the SPT today with the overnight bag per the facilities request   Return in about 1 month (around 01/06/2022) for SPT exchange .  These notes generated with voice recognition software. I apologize for typographical errors.  Michiel Cowboy, PA-C  [facility] [address]  [address] [place], [place] [address] [phone]   I spent 15 minutes on the day of the encounter to include pre-visit record review, face-to-face time with the patient, and post-visit ordering of tests.

## 2021-12-06 ENCOUNTER — Encounter: Payer: Self-pay | Admitting: Urology

## 2021-12-06 ENCOUNTER — Ambulatory Visit (INDEPENDENT_AMBULATORY_CARE_PROVIDER_SITE_OTHER): Payer: Medicare Other | Admitting: Urology

## 2021-12-06 DIAGNOSIS — I639 Cerebral infarction, unspecified: Secondary | ICD-10-CM

## 2021-12-06 DIAGNOSIS — N319 Neuromuscular dysfunction of bladder, unspecified: Secondary | ICD-10-CM | POA: Diagnosis not present

## 2022-01-03 NOTE — Progress Notes (Unsigned)
Suprapubic Cath Change  Patient is present today for a suprapubic catheter change due to urinary retention.  8 ml of water was drained from the balloon, a 18 FR foley cath was removed from the tract with out difficulty.  Site was cleaned and prepped in a sterile fashion with betadine.  A 18 FR foley cath was replaced into the tract no complications were noted. Urine return was noted, 10 ml of sterile water was inflated into the balloon and a night bag was attached for drainage.  Patient tolerated well.   Performed by: Michiel Cowboy, PA-C and Lizbeth Bark, CMA   Follow up: One month for SPT exchange.  Pubic hair was shaved today in the office as patient states it is uncomfortable when the suprapubic tubing gets caught up in her pubic hair.

## 2022-01-04 ENCOUNTER — Ambulatory Visit (INDEPENDENT_AMBULATORY_CARE_PROVIDER_SITE_OTHER): Payer: Medicare Other | Admitting: Urology

## 2022-01-04 DIAGNOSIS — N319 Neuromuscular dysfunction of bladder, unspecified: Secondary | ICD-10-CM | POA: Diagnosis not present

## 2022-01-04 DIAGNOSIS — R339 Retention of urine, unspecified: Secondary | ICD-10-CM | POA: Diagnosis not present

## 2022-01-13 DIAGNOSIS — I69351 Hemiplegia and hemiparesis following cerebral infarction affecting right dominant side: Secondary | ICD-10-CM | POA: Diagnosis not present

## 2022-01-13 DIAGNOSIS — K219 Gastro-esophageal reflux disease without esophagitis: Secondary | ICD-10-CM

## 2022-01-13 DIAGNOSIS — I5020 Unspecified systolic (congestive) heart failure: Secondary | ICD-10-CM | POA: Diagnosis not present

## 2022-01-13 DIAGNOSIS — I10 Essential (primary) hypertension: Secondary | ICD-10-CM

## 2022-01-13 DIAGNOSIS — F39 Unspecified mood [affective] disorder: Secondary | ICD-10-CM

## 2022-01-13 DIAGNOSIS — I25119 Atherosclerotic heart disease of native coronary artery with unspecified angina pectoris: Secondary | ICD-10-CM | POA: Diagnosis not present

## 2022-01-13 DIAGNOSIS — E039 Hypothyroidism, unspecified: Secondary | ICD-10-CM

## 2022-01-13 DIAGNOSIS — F1320 Sedative, hypnotic or anxiolytic dependence, uncomplicated: Secondary | ICD-10-CM

## 2022-01-13 DIAGNOSIS — E1159 Type 2 diabetes mellitus with other circulatory complications: Secondary | ICD-10-CM | POA: Diagnosis not present

## 2022-01-31 NOTE — Progress Notes (Unsigned)
Suprapubic Cath Change  Patient is present today for a suprapubic catheter change due to urinary retention.  9 ml of water was drained from the balloon, a 18 FR Silastic foley cath was removed from the tract with out difficulty.  Site was cleaned and prepped in a sterile fashion with betadine.  A 18 FR foley cath was replaced into the tract no complications were noted. Urine return was noted, 10 ml of sterile water was inflated into the balloon and a night bag was attached for drainage.  Patient tolerated well.    Performed by: Michiel Cowboy, PA-C and Honor Loh, CMA  Follow up: One month for SPT exchange

## 2022-02-01 ENCOUNTER — Ambulatory Visit (INDEPENDENT_AMBULATORY_CARE_PROVIDER_SITE_OTHER): Payer: Medicare Other | Admitting: Urology

## 2022-02-01 ENCOUNTER — Encounter: Payer: Self-pay | Admitting: Urology

## 2022-02-01 DIAGNOSIS — R339 Retention of urine, unspecified: Secondary | ICD-10-CM | POA: Diagnosis not present

## 2022-02-01 DIAGNOSIS — Z466 Encounter for fitting and adjustment of urinary device: Secondary | ICD-10-CM

## 2022-02-09 ENCOUNTER — Emergency Department: Payer: Medicare Other

## 2022-02-09 ENCOUNTER — Emergency Department
Admission: EM | Admit: 2022-02-09 | Discharge: 2022-02-09 | Disposition: A | Discharge status: Home / Self Care | Payer: Medicare Other | Attending: Emergency Medicine | Admitting: Emergency Medicine

## 2022-02-09 ENCOUNTER — Encounter: Payer: Self-pay | Admitting: Emergency Medicine

## 2022-02-09 DIAGNOSIS — R109 Unspecified abdominal pain: Secondary | ICD-10-CM | POA: Insufficient documentation

## 2022-02-09 DIAGNOSIS — T83091A Other mechanical complication of indwelling urethral catheter, initial encounter: Secondary | ICD-10-CM | POA: Insufficient documentation

## 2022-02-09 DIAGNOSIS — N390 Urinary tract infection, site not specified: Secondary | ICD-10-CM

## 2022-02-09 LAB — URINALYSIS, ROUTINE W REFLEX MICROSCOPIC
Bacteria, UA: NONE SEEN
Bilirubin Urine: NEGATIVE
Glucose, UA: NEGATIVE mg/dL
Ketones, ur: NEGATIVE mg/dL
Nitrite: POSITIVE — AB
Protein, ur: NEGATIVE mg/dL
Specific Gravity, Urine: 1.006 (ref 1.005–1.030)
pH: 6 (ref 5.0–8.0)

## 2022-02-09 MED ORDER — DIAZEPAM 5 MG PO TABS
10.0000 mg | ORAL_TABLET | Freq: Once | ORAL | Status: AC
  Administered 2022-02-09: 10 mg via ORAL
  Filled 2022-02-09: qty 2

## 2022-02-09 MED ORDER — OXYBUTYNIN CHLORIDE 5 MG PO TABS: 5.0000 mg | ORAL_TABLET | Freq: Three times a day (TID) | ORAL | 0 refills | Status: DC

## 2022-02-09 MED ORDER — FOSFOMYCIN TROMETHAMINE 3 G PO PACK
3.0000 g | PACK | ORAL | 0 refills | Status: AC
Start: 2022-02-12 — End: 2022-02-16

## 2022-02-09 MED ORDER — TICAGRELOR 60 MG PO TABS
60.0000 mg | ORAL_TABLET | Freq: Once | ORAL | Status: AC
  Administered 2022-02-09: 60 mg via ORAL
  Filled 2022-02-09: qty 1

## 2022-02-09 MED ORDER — OXYBUTYNIN CHLORIDE 5 MG PO TABS
5.0000 mg | ORAL_TABLET | Freq: Once | ORAL | Status: AC
  Administered 2022-02-09: 5 mg via ORAL
  Filled 2022-02-09: qty 1

## 2022-02-09 MED ORDER — FOSFOMYCIN TROMETHAMINE 3 G PO PACK
3.0000 g | PACK | Freq: Once | ORAL | Status: AC
  Administered 2022-02-09: 3 g via ORAL
  Filled 2022-02-09: qty 3

## 2022-02-09 MED ORDER — LIDOCAINE HCL URETHRAL/MUCOSAL 2 % EX GEL
1.0000 | Freq: Once | CUTANEOUS | Status: AC
  Administered 2022-02-09: 1 via TOPICAL
  Filled 2022-02-09: qty 10

## 2022-02-09 MED ORDER — IBUPROFEN 800 MG PO TABS
800.0000 mg | ORAL_TABLET | Freq: Once | ORAL | Status: AC
  Administered 2022-02-09: 800 mg via ORAL
  Filled 2022-02-09: qty 1

## 2022-02-09 MED ORDER — PANTOPRAZOLE SODIUM 40 MG PO TBEC
40.0000 mg | DELAYED_RELEASE_TABLET | Freq: Once | ORAL | Status: AC
  Administered 2022-02-09: 40 mg via ORAL
  Filled 2022-02-09: qty 1

## 2022-02-09 MED ORDER — PHENAZOPYRIDINE HCL 100 MG PO TABS: 100.0000 mg | ORAL_TABLET | Freq: Three times a day (TID) | ORAL | 0 refills | Status: AC

## 2022-02-09 MED ORDER — PHENAZOPYRIDINE HCL 200 MG PO TABS
200.0000 mg | ORAL_TABLET | Freq: Once | ORAL | Status: AC
  Administered 2022-02-09: 200 mg via ORAL
  Filled 2022-02-09: qty 1

## 2022-02-09 MED ORDER — LEVOTHYROXINE SODIUM 50 MCG PO TABS
25.0000 ug | ORAL_TABLET | Freq: Once | ORAL | Status: AC
  Administered 2022-02-09: 25 ug via ORAL
  Filled 2022-02-09: qty 1

## 2022-02-09 NOTE — ED Provider Notes (Signed)
[facility] Provider Note    Event Date/Time   First MD Initiated Contact with Patient 02/09/22 [phone]     (approximate)   History   Abdominal Pain   HPI  Victoria Medina is a 61 y.o. female here with abdominal pain, dysuria.  The patient has a chronic, indwelling, suprapubic catheter.  It is replaced every month.  She states that normally, after replacement, she has stinging pain and discomfort for several days but this then resolves.  She states that since the change 1 month ago as well as the most recent change a week ago, she has had nearly constant, burning, aching, suprapubic pain with what she describes as intermittent bladder spasms.  She had associated discomfort in her abdomen.  No fevers.  No flank pain.  No nausea or vomiting.  She is blind so she does not know what her urine does look like but she was told by nursing at her facility that the urine appeared darker.  No other complaints.  She sees Radiation protection practitioner with [facility] urology.Marland Kitchen   Physical Exam   Triage Vital Signs: ED Triage Vitals [02/09/22 0154]  Enc Vitals Group     BP 130/73     Pulse Rate 74     Resp 17     Temp 98 F (36.7 C)     Temp Source Oral     SpO2 97 %     Weight      Height      Head Circumference      Peak Flow      Pain Score      Pain Loc      Pain Edu?      Excl. in GC?     Most recent vital signs: Vitals:   02/09/22 0515 02/09/22 0700  BP: 128/70 (!) 161/79  Pulse: 70 65  Resp: 18 16  Temp:    SpO2: 99% 100%     General: Awake, no distress.  CV:  Good peripheral perfusion.  Regular rate and rhythm. Resp:  Normal effort.  Abd:  No distention.  Minimal suprapubic tenderness.  Suprapubic catheter is in place, small amount of serous drainage around the catheter site, but no skin redness or induration.  Moderate surrounding tenderness.  No CVA tenderness bilaterally.  No distention.  No peritonitis. Other:  Moist mucous membranes.  Nontoxic-appearing.   ED  Results / Procedures / Treatments   Labs (all labs ordered are listed, but only abnormal results are displayed) Labs Reviewed  LIPASE, BLOOD  COMPREHENSIVE METABOLIC PANEL  CBC  URINALYSIS, ROUTINE W REFLEX MICROSCOPIC     EKG    RADIOLOGY CT stone: Suprapubic catheter decompressing the bladder, no inflammatory changes, no hydronephrosis, chronic changes   I also independently reviewed and agree with radiologist interpretations.   PROCEDURES:  Critical Care performed: No  SUPRAPUBIC TUBE PLACEMENT  Date/Time: 02/09/2022 11:50 AM  Performed by: Shaune Pollack, MD Authorized by: Shaune Pollack, MD   Consent:    Consent obtained:  Verbal   Consent given by:  Patient Comments:     64 French suprapubic catheter replaced by me at bedside.  There was return of yellow, dark urine.  Tolerated well.  No pain.  Catheter balloon inflated without pain.  Catheter secured.   MEDICATIONS ORDERED IN ED: Medications - No data to display   IMPRESSION / MDM / ASSESSMENT AND PLAN / ED COURSE  I reviewed the triage vital signs and the nursing notes.  The patient is on the cardiac monitor to evaluate for evidence of arrhythmia and/or significant heart rate changes.   Ddx:  Differential includes the following, with pertinent life- or limb-threatening emergencies considered:  UTI, bladder spasm, neurogenic bladder, Foley complication, colitis, proctitis, diverticulitis, obstruction  Patient's presentation is most consistent with acute illness / injury with system symptoms.  MDM:  61 year old female with past medical history of paraplegia, blindness, stroke, chronic urinary retention, here with intermittent suprapubic pain and discomfort.  Suspect UTI with bladder spasms.  Patient refuses lab work.  She understands that without labs, cannot confirm or rule out sepsis or acute kidney injury.  That being said, she clinically appears very well.  Vital  signs are stable.  She is not tachycardic.  She is afebrile.  Urinalysis is consistent with UTI.  Reviewed her previous cultures, which show Pseudomonas and Klebsiella.  Will give a dose of fosfomycin as well as to repeat doses at home.  No evidence to suggest pyelonephritis clinically.  CT stone obtained, reviewed, shows no obstruction or other complications.  Patient updated and in agreement.  Suprapubic catheter was replaced by me using full sterile technique.  Will discharge with outpatient urology follow-up.  MEDICATIONS GIVEN IN ED: Medications - No data to display   Consults:     EMR reviewed  Prior urine culture results, prior ED visits, urology visit l earlier than this month for suprapubic change, which seemed uncomplicated     FINAL CLINICAL IMPRESSION(S) / ED DIAGNOSES   Final diagnoses:  None     Rx / DC Orders   ED Discharge Orders     None        Note:  This document was prepared using Dragon voice recognition software and may include unintentional dictation errors.   Shaune Pollack, MD 02/09/22 1150

## 2022-02-09 NOTE — ED Notes (Signed)
Pt refused blood work in triage.  

## 2022-02-09 NOTE — ED Notes (Signed)
Patient transported to CT 

## 2022-02-09 NOTE — ED Triage Notes (Signed)
Pt arrived via [facility] from home with c/o pain at suprapubic catheter site that was placed on Wednesday, 7/12. Pt denies N/V/D.   Pt is blind and has previous lefts sided deficits but is ambulatory.

## 2022-02-09 NOTE — ED Notes (Signed)
[facility]  CALLED  FOR  TRANSPORT  TO  [facility] 

## 2022-02-10 LAB — URINE CULTURE

## 2022-03-01 ENCOUNTER — Emergency Department
Admission: EM | Admit: 2022-03-01 | Discharge: 2022-03-01 | Disposition: A | Discharge status: Home / Self Care | Payer: Medicare Other | Attending: Emergency Medicine | Admitting: Emergency Medicine

## 2022-03-01 ENCOUNTER — Encounter: Payer: Self-pay | Admitting: Emergency Medicine

## 2022-03-01 ENCOUNTER — Emergency Department: Payer: Medicare Other

## 2022-03-01 ENCOUNTER — Other Ambulatory Visit: Payer: Self-pay

## 2022-03-01 DIAGNOSIS — Z9359 Other cystostomy status: Secondary | ICD-10-CM | POA: Insufficient documentation

## 2022-03-01 DIAGNOSIS — R079 Chest pain, unspecified: Secondary | ICD-10-CM | POA: Diagnosis not present

## 2022-03-01 DIAGNOSIS — I10 Essential (primary) hypertension: Secondary | ICD-10-CM | POA: Diagnosis not present

## 2022-03-01 DIAGNOSIS — I252 Old myocardial infarction: Secondary | ICD-10-CM | POA: Insufficient documentation

## 2022-03-01 DIAGNOSIS — Z8673 Personal history of transient ischemic attack (TIA), and cerebral infarction without residual deficits: Secondary | ICD-10-CM | POA: Diagnosis not present

## 2022-03-01 DIAGNOSIS — I2510 Atherosclerotic heart disease of native coronary artery without angina pectoris: Secondary | ICD-10-CM | POA: Diagnosis not present

## 2022-03-01 DIAGNOSIS — E871 Hypo-osmolality and hyponatremia: Secondary | ICD-10-CM | POA: Insufficient documentation

## 2022-03-01 DIAGNOSIS — R1030 Lower abdominal pain, unspecified: Secondary | ICD-10-CM | POA: Diagnosis present

## 2022-03-01 LAB — COMPREHENSIVE METABOLIC PANEL
ALT: 8 U/L (ref 0–44)
AST: 25 U/L (ref 15–41)
Albumin: 4.2 g/dL (ref 3.5–5.0)
Alkaline Phosphatase: 73 U/L (ref 38–126)
Anion gap: 9 (ref 5–15)
BUN: <5 mg/dL — ABNORMAL LOW (ref 6–20)
CO2: 26 mmol/L (ref 22–32)
Calcium: 8.9 mg/dL (ref 8.9–10.3)
Chloride: 95 mmol/L — ABNORMAL LOW (ref 98–111)
Creatinine, Ser: 0.81 mg/dL (ref 0.44–1.00)
GFR, Estimated: >60 mL/min (ref >60)
Glucose, Bld: 119 mg/dL — ABNORMAL HIGH (ref 70–99)
Potassium: 3.5 mmol/L (ref 3.5–5.1)
Sodium: 130 mmol/L — ABNORMAL LOW (ref 135–145)
Total Bilirubin: 0.6 mg/dL (ref 0.3–1.2)
Total Protein: 7.2 g/dL (ref 6.5–8.1)

## 2022-03-01 LAB — CBC WITH DIFFERENTIAL/PLATELET
Abs Immature Granulocytes: 0.03 10*3/uL (ref 0.00–0.07)
Basophils Absolute: 0 10*3/uL (ref 0.0–0.1)
Basophils Relative: 1 %
Eosinophils Absolute: 0.1 10*3/uL (ref 0.0–0.5)
Eosinophils Relative: 1 %
HCT: 30.2 % — ABNORMAL LOW (ref 36.0–46.0)
Hemoglobin: 9 g/dL — ABNORMAL LOW (ref 12.0–15.0)
Immature Granulocytes: 0 %
Lymphocytes Relative: 12 %
Lymphs Abs: 1 10*3/uL (ref 0.7–4.0)
MCH: 19.5 pg — ABNORMAL LOW (ref 26.0–34.0)
MCHC: 29.8 g/dL — ABNORMAL LOW (ref 30.0–36.0)
MCV: 65.4 fL — ABNORMAL LOW (ref 80.0–100.0)
Monocytes Absolute: 0.6 10*3/uL (ref 0.1–1.0)
Monocytes Relative: 6 %
Neutro Abs: 7 10*3/uL (ref 1.7–7.7)
Neutrophils Relative %: 80 %
Platelets: 524 10*3/uL — ABNORMAL HIGH (ref 150–400)
RBC: 4.62 MIL/uL (ref 3.87–5.11)
RDW: 18.6 % — ABNORMAL HIGH (ref 11.5–15.5)
Smear Review: NORMAL
WBC: 8.7 10*3/uL (ref 4.0–10.5)
nRBC: 0 % (ref 0.0–0.2)

## 2022-03-01 LAB — URINALYSIS, ROUTINE W REFLEX MICROSCOPIC
Bilirubin Urine: NEGATIVE
Glucose, UA: NEGATIVE mg/dL
Hgb urine dipstick: NEGATIVE
Ketones, ur: NEGATIVE mg/dL
Leukocytes,Ua: NEGATIVE
Nitrite: POSITIVE — AB
Protein, ur: NEGATIVE mg/dL
Specific Gravity, Urine: 1.002 — ABNORMAL LOW (ref 1.005–1.030)
Squamous Epithelial / HPF: NONE SEEN (ref 0–5)
pH: 6 (ref 5.0–8.0)

## 2022-03-01 LAB — TROPONIN I (HIGH SENSITIVITY)
Troponin I (High Sensitivity): 7 ng/L (ref <18)
Troponin I (High Sensitivity): 7 ng/L (ref <18)

## 2022-03-01 NOTE — ED Notes (Signed)
[facility]  CALLED  AGAIN  FOR  TRANSPORT  TO  [facility]

## 2022-03-01 NOTE — ED Provider Notes (Addendum)
[facility] Provider Note    Event Date/Time   First MD Initiated Contact with Patient 03/01/22 0802     (approximate)   History   No chief complaint on file.   HPI  Victoria Medina is a 61 y.o. female past with history of CAD, HTN, HDL, CVA and neurogenic bladder with chronic suprapubic Foley who presents for evaluation of some suprapubic pressure she states was a result of her back becoming overfilled.  She states she was asking for help having the bag emptied all night but no one helped her and that when she came to emergency room and the bag was emptied she will complete relief of her pain.  States it does not burn UTI that she has had in the past and she does not think she is spasming at all.  She has no other acute complaints at this time including any fevers, ongoing pain, headache earache, sore throat, nausea, vomiting or diarrhea or any bloody urine output.  She states she does not want to wait for room and wishes to be discharged back to her nursing facility.    Past Medical History:  Diagnosis Date   Acute ST elevation myocardial infarction (STEMI) of inferolateral wall (HCC) 01/25/2020   Blind    Cholecystitis 04/22/2021   History of migraine headaches    Hyperlipemia    Myocardial infarction [facility])    Stroke [facility])      Physical Exam  Triage Vital Signs: ED Triage Vitals  Enc Vitals Group     BP 03/01/22 0737 (!) 155/76     Pulse Rate 03/01/22 0737 66     Resp 03/01/22 0737 17     Temp 03/01/22 0737 98.8 F (37.1 C)     Temp Source 03/01/22 0737 Oral     SpO2 03/01/22 0737 97 %     Weight 03/01/22 0739 198 lb 6.6 oz (90 kg)     Height 03/01/22 0739 5\' 1"  (1.549 m)     Head Circumference --      Peak Flow --      Pain Score 03/01/22 0738 0     Pain Loc --      Pain Edu? --      Excl. in GC? --     Most recent vital signs: Vitals:   03/01/22 0737 03/01/22 1057  BP: (!) 155/76 (!) 153/96  Pulse: 66 69  Resp: 17 18  Temp: 98.8 F  (37.1 C) 98.4 F (36.9 C)  SpO2: 97% 97%    General: Awake, no distress.  CV:  Good peripheral perfusion.  Resp:  Normal effort.  Abd:  No distention.  Soft throughout with suprapubic in place.  There is yellow urine in the tubing.  No significant abdominal tenderness. Other:     ED Results / Procedures / Treatments  Labs (all labs ordered are listed, but only abnormal results are displayed) Labs Reviewed  URINALYSIS, ROUTINE W REFLEX MICROSCOPIC - Abnormal; Notable for the following components:      Result Value   Color, Urine AMBER (*)    APPearance CLEAR (*)    Specific Gravity, Urine 1.002 (*)    Nitrite POSITIVE (*)    Bacteria, UA RARE (*)    All other components within normal limits  CBC WITH DIFFERENTIAL/PLATELET - Abnormal; Notable for the following components:   Hemoglobin 9.0 (*)    HCT 30.2 (*)    MCV 65.4 (*)    MCH 19.5 (*)  MCHC 29.8 (*)    RDW 18.6 (*)    Platelets 524 (*)    All other components within normal limits  COMPREHENSIVE METABOLIC PANEL - Abnormal; Notable for the following components:   Sodium 130 (*)    Chloride 95 (*)    Glucose, Bld 119 (*)    BUN <5 (*)    All other components within normal limits  URINE CULTURE  TROPONIN I (HIGH SENSITIVITY)  TROPONIN I (HIGH SENSITIVITY)     EKG    RADIOLOGY   PROCEDURES:  Critical Care performed: No  Procedures    MEDICATIONS ORDERED IN ED: Medications - No data to display   IMPRESSION / MDM / ASSESSMENT AND PLAN / ED COURSE  I reviewed the triage vital signs and the nursing notes. Patient's presentation is most consistent with acute, uncomplicated illness.                               Differential diagnosis includes, but is not limited to, symptoms related to overfilled distended bladder possibly from catheter bag that had been overfilled.  Patient had complete relief of her symptoms after it was empty.  She her abdomen is otherwise soft and she has no fever otherwise have a  low suspicion for clinically significant cystitis, kidney injury or catheter malfunction.  He states she does not wish to be observed any longer as I stated we could ensure it was making appropriate yellow urine over the next hour or so and then she continued to be pain-free but she does not wish to be observed any longer and wishes to be discharged.  Urinalysis was sent from patient's chronic suprapubic indwelling Foley that is positive for nitrates and rare bacteria but has no WBCs or leukocyte esterase.  Given patient is currently pain-free without any tenderness or fever suspect this may represent some chronic colonization of patient's Foley with a lower suspicion for clinically significant cystitis at this time patient will follow-up with her outpatient providers.  Discharged in stable condition.   Patient discharged approximately 9:15 AM.  While awaiting EMS transfer back to facility she started complaining of some left-sided chest pain around 10:50 AM.  On my assessment she states it is substernal and achy.  States he has a history of CAD.  Will obtain x-ray troponin chest x-ray and labs.  She has no other acute concerns at this time.  EKG is remarkable for sinus rhythm with a ventricular rate of 65 with nonspecific ST changes in lead III and aVF as well as V2 and V3 without other evidence of acute ischemia or significant arrhythmia.  Chest reviewed by myself shows no focal consoidation, effusion, edema, pneumothorax or other clear acute thoracic process. I also reviewed radiology interpretation and agree with findings described.  CMP shows chronic hyponatremia with sodium of 130, at 132 4 months ago without any other significant lecture metabolic derangements.  CBC without leukocytosis and hemoglobin of 9 compared to 7.84 months ago.  Platelets are 524 compared to 576.  Repeat troponins x2 are negative.  Patient is denying any chest pain on my reassessment.  She has continued to have stable  vitals.  At this point I think she is appropriate for outpatient follow-up.  Discharged in stable condition.     FINAL CLINICAL IMPRESSION(S) / ED DIAGNOSES   Final diagnoses:  Suprapubic catheter (HCC)  Chest pain, unspecified type     Rx / DC Orders  ED Discharge Orders     None        Note:  This document was prepared using Dragon voice recognition software and may include unintentional dictation errors.   Lucrezia Starch, MD 03/01/22 0815    Lucrezia Starch, MD 03/01/22 [id number]    Lucrezia Starch, MD 03/01/22 1500

## 2022-03-01 NOTE — ED Triage Notes (Signed)
Pt via EMS from [facility]. Pt has a suprapubic cath states she has pain at the cath site. States the pain goes away when they empty. Cath is emptying relieves the pain. Pt is blind. Pt is A&Ox4 and NAD.   158/86 70 HR  96% on RA

## 2022-03-01 NOTE — ED Notes (Signed)
Pt reports that she was having pain around her catheter site. Pt reports that since the nurse at [facility] emptied her catheter the pain has went away.

## 2022-03-01 NOTE — ED Notes (Signed)
[facility]  CALLED  FOR  TRANSPORT  TO  [facility] 

## 2022-03-01 NOTE — ED Notes (Signed)
Pt alert, IV removed, discharge instructions given to EMS.

## 2022-03-01 NOTE — ED Triage Notes (Signed)
Pt via EMS from [facility], pt states she has been having pain since last night. States that staff did not empty her catheter since last night and states that when they emptied it this AM, pain was relieved. Said there is a medication they normally give her but they told her they couldn't have it till 8. Pt is blind. Pt is A&OX4 and NAD

## 2022-03-03 LAB — URINE CULTURE: Culture: 80000 — AB

## 2022-03-04 NOTE — Consult Note (Signed)
ED Antimicrobial Stewardship Positive Culture Follow Up   Victoria Medina is an 61 y.o. female who presented to [facility] on 03/01/2022 with a chief complaint of suprapubic pressure. PMH significant for CAD, HTN, HDL, CVA and neurogenic bladder with chronic suprapubic Foley. Patient reported complete relief of pain after her catheter bag was changed, and patient was discharged home same day. Patient was afebrile and WBC WNL. Clean catch UCx from 03/01/2022 grew 80,000 colonies/mL Corynebacterium spp after patient discharge. Discussed positive culture with MD and decided against treatment at this time.   Recent Results (from the past 720 hour(s))  Urine Culture     Status: Abnormal   Collection Time: 02/09/22  7:37 AM   Specimen: Urine, Clean Catch  Result Value Ref Range Status   Specimen Description   Final    URINE, CLEAN CATCH Performed at [facility], [address], [place], [place] [address]    Special Requests   Final    NONE Performed at [facility], [address] [place], [place] [address]    Culture MULTIPLE SPECIES PRESENT, SUGGEST RECOLLECTION (A)  Final   Report Status 02/10/2022 FINAL  Final  Urine Culture     Status: Abnormal   Collection Time: 03/01/22  7:39 AM   Specimen: Urine, Clean Catch  Result Value Ref Range Status   Specimen Description   Final    URINE, CLEAN CATCH Performed at [facility], [address], [place], [place] [address]    Special Requests   Final    NONE Performed at [facility], [address] [place], [place] [address]    Culture (A)  Final    80,000 COLONIES/mL CORYNEBACTERIUM SPECIES Standardized susceptibility testing for this organism is not available. Performed at [facility], [address] [place], [place] [address]    Report Status 03/03/2022 FINAL  Final     [x]  Patient discharged originally without antimicrobial agent and treatment is not indicated  ED Provider: , MD  Antoine Primas, PharmD PGY1 Pharmacy Resident 03/04/2022 3:26 PM

## 2022-03-08 ENCOUNTER — Ambulatory Visit (INDEPENDENT_AMBULATORY_CARE_PROVIDER_SITE_OTHER): Payer: Medicare Other | Admitting: Physician Assistant

## 2022-03-08 DIAGNOSIS — R339 Retention of urine, unspecified: Secondary | ICD-10-CM

## 2022-03-08 DIAGNOSIS — I639 Cerebral infarction, unspecified: Secondary | ICD-10-CM

## 2022-03-08 DIAGNOSIS — Z466 Encounter for fitting and adjustment of urinary device: Secondary | ICD-10-CM | POA: Diagnosis not present

## 2022-03-08 DIAGNOSIS — R301 Vesical tenesmus: Secondary | ICD-10-CM

## 2022-03-08 MED ORDER — GEMTESA 75 MG PO TABS: 75.0000 mg | ORAL_TABLET | Freq: Every day | ORAL | 11 refills | Status: AC

## 2022-03-08 MED ORDER — URIBEL 118 MG PO CAPS: 1.0000 | ORAL_CAPSULE | Freq: Four times a day (QID) | ORAL | 3 refills | Status: DC | PRN

## 2022-03-08 NOTE — Patient Instructions (Addendum)
Stop pyridium (Azo). Start Uribel instead. This will turn your urine blue instead of orange. It will help with your burning. Stop oxybutyinin. Start Nelson instead. This will help with your leakage and bladder spasms.

## 2022-03-08 NOTE — Progress Notes (Unsigned)
Suprapubic Cath Change  Patient is present today for a suprapubic catheter change due to urinary retention.  8ml of water was drained from the balloon, a 18FR silicone foley cath was removed from the tract without difficulty.  Site was cleaned and prepped in a sterile fashion with betadine.  A 18FR Silastic foley cath was replaced into the tract no complications were noted. Urine return was noted, 10 ml of sterile water was inflated into the balloon and a night bag was attached for drainage.  Patient tolerated well.     Performed by: Carman Ching, PA-C and Franchot Erichsen, CMA  Additional Notes: Patient is very concerned about ongoing bladder burning and pain that she states is relieved when they empty her Foley bag.  She thinks this represents UTI.  She later reports that she is already on antibiotics for possible UTI, amoxicillin with her recent urine culture growing low colony counts of corynebacterium.  This should be culture appropriate.  Okay to continue antibiotics for possible UTI.  She has been taking Azo continuously, which I strongly advised her against.  I sent in a prescription for Uribel as an alternative and advised her that it would turn her urine blue instead of orange.  I also sent in a prescription for [facility] for management of bladder spasms.  I think she is having bladder spasms due to her burning discomfort and reports of intermittent urinary leakage from around her catheter tubing and from the urethra.  She was very hesitant to start medication for bladder spasms, stating that she knows when she has bladder spasms and she does not think that is her problem.  I told her then she could decline the medication that I prescribed for her.  I advised her that if she wishes to try the medication that I prescribed for her that I think would help her symptoms, she should continue the medication for a full 4 weeks before stopping it as it will take at least 2 weeks to make a difference  in her symptoms.  She seemed doubtful of this.  Follow up: Return in about 4 weeks (around 04/05/2022) for SPT exchange.  I spent 20 minutes on the day of the encounter to include pre-visit record review, face-to-face time with the patient, and post-visit ordering of tests.

## 2022-03-09 ENCOUNTER — Ambulatory Visit: Payer: Medicaid Other | Admitting: Urology

## 2022-03-14 ENCOUNTER — Ambulatory Visit: Payer: Medicare Other | Admitting: Urology

## 2022-04-05 NOTE — Progress Notes (Signed)
Suprapubic Cath Change  Patient is present today for a suprapubic catheter change due to urinary retention.  8 ml of water was drained from the balloon, a 18 FR Silastic foley cath was removed from the tract with out difficulty.  Site was cleaned and prepped in a sterile fashion with betadine.  A 18 FR Silastic foley cath was replaced into the tract no complications were noted. Urine return was noted, 10 ml of sterile water was inflated into the balloon and a night bag was attached for drainage.  Patient tolerated well.   Performed by: Michiel Cowboy, PA-C and Leonie Douglas, CMA  Follow up: One month SPT exchange.  She states that she is having burning in her bladder.  She states she feels like her urine is like acid.  She was seen in the emergency room in August and a urine culture grew out Corynebacterium sp.  At the time, she was not treated with antibiotics as she was afebrile did not have symptoms and her WBC count was within normal limits.  We will go ahead and treat with doxycycline 100 mg twice daily for 7 days.  We will reassess upon return.  I spent 20 minutes on the day of the encounter to include pre-visit record review, face-to-face time with the patient, and post-visit ordering of tests.

## 2022-04-06 ENCOUNTER — Ambulatory Visit (INDEPENDENT_AMBULATORY_CARE_PROVIDER_SITE_OTHER): Payer: Medicare Other | Admitting: Urology

## 2022-04-06 ENCOUNTER — Encounter: Payer: Self-pay | Admitting: Urology

## 2022-04-06 DIAGNOSIS — R3989 Other symptoms and signs involving the genitourinary system: Secondary | ICD-10-CM

## 2022-04-06 DIAGNOSIS — Z466 Encounter for fitting and adjustment of urinary device: Secondary | ICD-10-CM | POA: Diagnosis not present

## 2022-04-06 DIAGNOSIS — R339 Retention of urine, unspecified: Secondary | ICD-10-CM | POA: Diagnosis not present

## 2022-04-06 DIAGNOSIS — I639 Cerebral infarction, unspecified: Secondary | ICD-10-CM | POA: Diagnosis not present

## 2022-04-06 MED ORDER — DOXYCYCLINE HYCLATE 100 MG PO CAPS: 100.0000 mg | ORAL_CAPSULE | Freq: Two times a day (BID) | ORAL | 0 refills | Status: DC

## 2022-04-13 LAB — BASIC METABOLIC PANEL
BUN: 11 (ref 4–21)
CO2: 27 — AB (ref 13–22)
Chloride: 99 (ref 99–108)
Creatinine: 0.8 (ref 0.5–1.1)
Glucose: 95
Potassium: 4.2 mEq/L (ref 3.5–5.1)
Sodium: 134 — AB (ref 137–147)

## 2022-04-13 LAB — IRON,TIBC AND FERRITIN PANEL
%SAT: 5
Iron: 17
TIBC: 349

## 2022-04-13 LAB — COMPREHENSIVE METABOLIC PANEL
Albumin: 3.7 (ref 3.5–5.0)
Calcium: 8.8 (ref 8.7–10.7)
Globulin: 2.6

## 2022-04-13 LAB — CBC AND DIFFERENTIAL
HCT: 29 — AB (ref 36–46)
Hemoglobin: 8.8 — AB (ref 12.0–16.0)
Platelets: 419 10*3/uL — AB (ref 150–400)
WBC: 10

## 2022-04-13 LAB — HEPATIC FUNCTION PANEL
ALT: 7 U/L (ref 7–35)
AST: 11 — AB (ref 13–35)
Alkaline Phosphatase: 101 (ref 25–125)
Bilirubin, Total: 0.4

## 2022-04-13 LAB — CBC: RBC: 4.37 (ref 3.87–5.11)

## 2022-05-03 ENCOUNTER — Other Ambulatory Visit: Payer: Self-pay | Admitting: Nurse Practitioner

## 2022-05-03 MED ORDER — CLONAZEPAM 0.5 MG PO TABS: 0.5000 mg | ORAL_TABLET | Freq: Every day | ORAL | 5 refills | Status: DC

## 2022-05-08 NOTE — Progress Notes (Unsigned)
Suprapubic Cath Change  Patient is present today for a suprapubic catheter change due to urinary retention.  8 ml of water was drained from the balloon, a 18 FR foley cath was removed from the tract with out difficulty.  Site was cleaned and prepped in a sterile fashion with betadine.  A 18 FR foley cath was replaced into the tract no complications were noted. Urine return was noted, 10 ml of sterile water was inflated into the balloon and a night bag was attached for drainage.  Patient tolerated well. A night bag was given to patient and proper instruction was given on how to switch bags.    Performed by: Zara Council, PA-C and Evelina Bucy, CMA   Follow up: She continues to have bladder pain specifically when the tube is manipulated.  She states that she feels the pain all the way through the pubic area and through the vagina and it makes her feel like she has to urinate.  She also continues to have leaking through the suprapubic tube insertion site and from the urethra below.  She states the Uribel does provide some relief for about an hour and is requesting to take it every 3 hours.  I explained to her that that would be an overdose of the Uribel and we cannot do that, but I will order a CT scan for further evaluation regarding her symptoms.  She has not had fevers, chills, nausea or vomiting.  Her urine is clear blue-yellow.  UA 11-30 WBCs, 3-10 RBCs and moderate bacteria.   Urine culture pending  I spent 15 minutes on the day of the encounter to include pre-visit record review, face-to-face time with the patient, and post-visit ordering of tests.

## 2022-05-09 ENCOUNTER — Ambulatory Visit (INDEPENDENT_AMBULATORY_CARE_PROVIDER_SITE_OTHER): Payer: Medicare Other | Admitting: Urology

## 2022-05-09 ENCOUNTER — Encounter: Payer: Self-pay | Admitting: Urology

## 2022-05-09 DIAGNOSIS — I639 Cerebral infarction, unspecified: Secondary | ICD-10-CM | POA: Diagnosis not present

## 2022-05-09 DIAGNOSIS — Z9359 Other cystostomy status: Secondary | ICD-10-CM | POA: Diagnosis not present

## 2022-05-09 DIAGNOSIS — R339 Retention of urine, unspecified: Secondary | ICD-10-CM

## 2022-05-09 DIAGNOSIS — R1084 Generalized abdominal pain: Secondary | ICD-10-CM

## 2022-05-10 LAB — URINALYSIS, COMPLETE
Bilirubin, UA: NEGATIVE
Glucose, UA: NEGATIVE
Ketones, UA: NEGATIVE
Nitrite, UA: NEGATIVE
Protein,UA: NEGATIVE
Specific Gravity, UA: <1.005 — ABNORMAL LOW (ref 1.005–1.030)
Urobilinogen, Ur: 0.2 mg/dL (ref 0.2–1.0)
pH, UA: 6.5 (ref 5.0–7.5)

## 2022-05-10 LAB — MICROSCOPIC EXAMINATION

## 2022-05-12 ENCOUNTER — Telehealth: Payer: Self-pay

## 2022-05-12 DIAGNOSIS — R3989 Other symptoms and signs involving the genitourinary system: Secondary | ICD-10-CM

## 2022-05-12 LAB — CULTURE, URINE COMPREHENSIVE

## 2022-05-12 MED ORDER — FOSFOMYCIN TROMETHAMINE 3 G PO PACK: 3.0000 g | PACK | ORAL | Status: AC

## 2022-05-12 NOTE — Telephone Encounter (Signed)
-----   Message from Nori Riis, PA-C sent at 05/12/2022 10:13 AM EDT ----- Please let Mrs. Netzley know that her urine culture was positive for infection and I would like for her to take Fosfomycin 3 grams every 48 hours for three doses.

## 2022-05-12 NOTE — Telephone Encounter (Signed)
Notified Cheryl at [facility] of positive culture and incoming Abx Rx. Cheryl expressed understanding. Abx Rx sent to pharmacy.

## 2022-05-22 ENCOUNTER — Ambulatory Visit
Admission: RE | Admit: 2022-05-22 | Discharge: 2022-05-22 | Disposition: A | Discharge status: Home / Self Care | Payer: Medicare Other | Source: Ambulatory Visit | Attending: Urology | Admitting: Urology

## 2022-05-22 DIAGNOSIS — R1084 Generalized abdominal pain: Secondary | ICD-10-CM | POA: Diagnosis present

## 2022-05-22 DIAGNOSIS — Z9359 Other cystostomy status: Secondary | ICD-10-CM | POA: Insufficient documentation

## 2022-05-28 ENCOUNTER — Other Ambulatory Visit: Payer: Self-pay

## 2022-05-28 ENCOUNTER — Emergency Department
Admission: EM | Admit: 2022-05-28 | Discharge: 2022-05-28 | Disposition: A | Discharge status: Home / Self Care | Payer: Medicare Other | Attending: Emergency Medicine | Admitting: Emergency Medicine

## 2022-05-28 ENCOUNTER — Encounter: Payer: Self-pay | Admitting: Emergency Medicine

## 2022-05-28 DIAGNOSIS — I10 Essential (primary) hypertension: Secondary | ICD-10-CM | POA: Diagnosis not present

## 2022-05-28 DIAGNOSIS — N390 Urinary tract infection, site not specified: Secondary | ICD-10-CM | POA: Diagnosis not present

## 2022-05-28 DIAGNOSIS — E119 Type 2 diabetes mellitus without complications: Secondary | ICD-10-CM | POA: Insufficient documentation

## 2022-05-28 DIAGNOSIS — N3289 Other specified disorders of bladder: Secondary | ICD-10-CM | POA: Diagnosis present

## 2022-05-28 DIAGNOSIS — E039 Hypothyroidism, unspecified: Secondary | ICD-10-CM | POA: Insufficient documentation

## 2022-05-28 DIAGNOSIS — I2510 Atherosclerotic heart disease of native coronary artery without angina pectoris: Secondary | ICD-10-CM | POA: Insufficient documentation

## 2022-05-28 LAB — URINALYSIS, ROUTINE W REFLEX MICROSCOPIC
Bilirubin Urine: NEGATIVE
Glucose, UA: NEGATIVE mg/dL
Ketones, ur: NEGATIVE mg/dL
Nitrite: POSITIVE — AB
Protein, ur: NEGATIVE mg/dL
Specific Gravity, Urine: 1.003 — ABNORMAL LOW (ref 1.005–1.030)
Squamous Epithelial / HPF: NONE SEEN (ref 0–5)
WBC, UA: >50 WBC/hpf — ABNORMAL HIGH (ref 0–5)
pH: 6 (ref 5.0–8.0)

## 2022-05-28 LAB — CBC
HCT: 33.8 % — ABNORMAL LOW (ref 36.0–46.0)
Hemoglobin: 10 g/dL — ABNORMAL LOW (ref 12.0–15.0)
MCH: 20.3 pg — ABNORMAL LOW (ref 26.0–34.0)
MCHC: 29.6 g/dL — ABNORMAL LOW (ref 30.0–36.0)
MCV: 68.6 fL — ABNORMAL LOW (ref 80.0–100.0)
Platelets: 436 10*3/uL — ABNORMAL HIGH (ref 150–400)
RBC: 4.93 MIL/uL (ref 3.87–5.11)
RDW: 17.9 % — ABNORMAL HIGH (ref 11.5–15.5)
WBC: 10.8 10*3/uL — ABNORMAL HIGH (ref 4.0–10.5)
nRBC: 0 % (ref 0.0–0.2)

## 2022-05-28 LAB — BASIC METABOLIC PANEL
Anion gap: 8 (ref 5–15)
BUN: 14 mg/dL (ref 6–20)
CO2: 27 mmol/L (ref 22–32)
Calcium: 8.6 mg/dL — ABNORMAL LOW (ref 8.9–10.3)
Chloride: 100 mmol/L (ref 98–111)
Creatinine, Ser: 0.98 mg/dL (ref 0.44–1.00)
GFR, Estimated: >60 mL/min (ref >60)
Glucose, Bld: 137 mg/dL — ABNORMAL HIGH (ref 70–99)
Potassium: 3.2 mmol/L — ABNORMAL LOW (ref 3.5–5.1)
Sodium: 135 mmol/L (ref 135–145)

## 2022-05-28 MED ORDER — CEFDINIR 300 MG PO CAPS: 300.0000 mg | ORAL_CAPSULE | Freq: Two times a day (BID) | ORAL | Status: DC

## 2022-05-28 MED ORDER — CEFDINIR 300 MG PO CAPS: 300.0000 mg | ORAL_CAPSULE | Freq: Two times a day (BID) | ORAL | 0 refills | Status: DC

## 2022-05-28 MED ORDER — CEFDINIR 300 MG PO CAPS
300.0000 mg | ORAL_CAPSULE | Freq: Two times a day (BID) | ORAL | Status: DC
  Administered 2022-05-28: 300 mg via ORAL

## 2022-05-28 MED ORDER — CEFDINIR 300 MG PO CAPS
300.0000 mg | ORAL_CAPSULE | Freq: Two times a day (BID) | ORAL | Status: DC
  Filled 2022-05-28: qty 1

## 2022-05-28 NOTE — ED Provider Notes (Signed)
[facility] Provider Note    Event Date/Time   First MD Initiated Contact with Patient 05/28/22 [id number]     (approximate)   History   Bladder Spasms   HPI  Victoria Medina is a 61 y.o. female   is brought to the ED via EMS [facility] with complaint of bladder spasms.  Patient states that she has a long standing history of urinary problems and currently has an indwelling suprapubic catheter and sees urology for scheduled visits.  Patient currently is taking an antispasmodic.  She denies any other symptoms such as fever, chills, nausea, vomiting or flank pain.  Patient has history of hypertension, diabetes, hypothyroidism, GERD, history of CVA, CAD, non-STEMI, hypothyroidism and blindness.      Physical Exam   Triage Vital Signs: ED Triage Vitals  Enc Vitals Group     BP 05/28/22 0211 137/85     Pulse Rate 05/28/22 0211 77     Resp 05/28/22 0211 20     Temp 05/28/22 0211 98.6 F (37 C)     Temp Source 05/28/22 0211 Oral     SpO2 05/28/22 0211 96 %     Weight 05/28/22 0212 198 lb 6.6 oz (90 kg)     Height 05/28/22 0212 5\' 1"  (1.549 m)     Head Circumference --      Peak Flow --      Pain Score 05/28/22 0211 10     Pain Loc --      Pain Edu? --      Excl. in [place]? --     Most recent vital signs: Vitals:   05/28/22 0500 05/28/22 1146  BP: (!) 142/80 138/78  Pulse: 74 70  Resp: 18 18  Temp: 98.4 F (36.9 C) 98 F (36.7 C)  SpO2: 97% 98%     General: Awake, no distress.  CV:  Good peripheral perfusion.  Resp:  Normal effort.  Lungs are clear bilaterally. Abd:  No distention.  Soft, nontender, bowel sounds present. Other:     ED Results / Procedures / Treatments   Labs (all labs ordered are listed, but only abnormal results are displayed) Labs Reviewed  URINALYSIS, ROUTINE W REFLEX MICROSCOPIC - Abnormal; Notable for the following components:      Result Value   Color, Urine STRAW (*)    APPearance HAZY (*)    Specific Gravity,  Urine 1.003 (*)    Hgb urine dipstick MODERATE (*)    Nitrite POSITIVE (*)    Leukocytes,Ua LARGE (*)    WBC, UA >50 (*)    Bacteria, UA RARE (*)    All other components within normal limits  BASIC METABOLIC PANEL - Abnormal; Notable for the following components:   Potassium 3.2 (*)    Glucose, Bld 137 (*)    Calcium 8.6 (*)    All other components within normal limits  CBC - Abnormal; Notable for the following components:   WBC 10.8 (*)    Hemoglobin 10.0 (*)    HCT 33.8 (*)    MCV 68.6 (*)    MCH 20.3 (*)    MCHC 29.6 (*)    RDW 17.9 (*)    Platelets 436 (*)    All other components within normal limits  URINE CULTURE      PROCEDURES:  Critical Care performed:   Procedures   MEDICATIONS ORDERED IN ED: Medications  cefdinir (OMNICEF) capsule 300 mg (300 mg Oral Given 05/28/22 0901)  IMPRESSION / MDM / ASSESSMENT AND PLAN / ED COURSE  I reviewed the triage vital signs and the nursing notes.   Differential diagnosis includes, but is not limited to, dysuria, bladder spasms, urinary tract infection, recurrent UTI.  61 year old female is brought to the ED via EMS from [facility] nursing facility with history of longstanding urinary symptoms.  She states that she is now having bladder spasms which she has had in the past and is currently on medication that the urologist told her to stay on.  Urinalysis does show that she does have greater than 50 WBCs with rare bacteria and 11-20 RBCs.  BMP showed glucose 137 and potassium slightly low at 3.2.  WBC 10.8 and hemoglobin 10/hematocrit 33.8.  Urine culture was ordered.  Patient was given cefdinir while in the ED and a prescription for the same was sent to the pharmacy.  Patient is being transferred back to [facility] by EMS.  She is to follow-up with urology if any continued problems and return to the emergency department if any severe worsening of her symptoms.       Patient's presentation is most consistent with acute  complicated illness / injury requiring diagnostic workup.  FINAL CLINICAL IMPRESSION(S) / ED DIAGNOSES   Final diagnoses:  Acute lower urinary tract infection     Rx / DC Orders   ED Discharge Orders          Ordered    cefdinir (OMNICEF) 300 MG capsule  2 times daily        05/28/22 1008             Note:  This document was prepared using Dragon voice recognition software and may include unintentional dictation errors.   Tommi Rumps, PA-C 05/28/22 1305    Concha Se, MD 05/29/22 1005

## 2022-05-28 NOTE — ED Triage Notes (Signed)
Pt with surpapubic catheter on assessment. Pt states bladder pain has been ongoing for several months. Pt blind at baseline. Pt states has been treated several times for UTI.   Pt states pain has been relieved in the past with medication that turns her urine blue and numbs her bladder

## 2022-05-28 NOTE — Discharge Instructions (Signed)
Follow-up with your primary care provider if any continued problems or concerns.  A prescription for Omnicef (cefdinir) was sent to the pharmacy.  You are also given this medication while in the emergency department and will need to take 1 also this evening which the facility already has.  Also follow-up with your urologist if any continued problems.  A culture was sent of your urine to see if any bacteria grow.

## 2022-05-28 NOTE — ED Triage Notes (Signed)
To triage via [facility] from [facility]. Pt c/o bladder pain x several months. Pain increased over past several days.  Pt has indwelling catheter. Pt alert, oriented, ambulatory. Pt is blind.

## 2022-05-29 ENCOUNTER — Encounter: Payer: Self-pay | Admitting: Student

## 2022-05-29 ENCOUNTER — Non-Acute Institutional Stay (SKILLED_NURSING_FACILITY): Payer: Medicare Other | Admitting: Student

## 2022-05-29 DIAGNOSIS — Z9359 Other cystostomy status: Secondary | ICD-10-CM

## 2022-05-29 DIAGNOSIS — N390 Urinary tract infection, site not specified: Secondary | ICD-10-CM

## 2022-05-29 DIAGNOSIS — E039 Hypothyroidism, unspecified: Secondary | ICD-10-CM

## 2022-05-29 DIAGNOSIS — I2119 ST elevation (STEMI) myocardial infarction involving other coronary artery of inferior wall: Secondary | ICD-10-CM

## 2022-05-29 DIAGNOSIS — T83510D Infection and inflammatory reaction due to cystostomy catheter, subsequent encounter: Secondary | ICD-10-CM | POA: Diagnosis not present

## 2022-05-29 DIAGNOSIS — E669 Obesity, unspecified: Secondary | ICD-10-CM

## 2022-05-29 DIAGNOSIS — R4781 Slurred speech: Secondary | ICD-10-CM

## 2022-05-29 DIAGNOSIS — I2699 Other pulmonary embolism without acute cor pulmonale: Secondary | ICD-10-CM | POA: Diagnosis not present

## 2022-05-29 DIAGNOSIS — Z8673 Personal history of transient ischemic attack (TIA), and cerebral infarction without residual deficits: Secondary | ICD-10-CM

## 2022-05-29 DIAGNOSIS — K5901 Slow transit constipation: Secondary | ICD-10-CM

## 2022-05-29 DIAGNOSIS — I10 Essential (primary) hypertension: Secondary | ICD-10-CM

## 2022-05-29 DIAGNOSIS — E1169 Type 2 diabetes mellitus with other specified complication: Secondary | ICD-10-CM

## 2022-05-29 DIAGNOSIS — H47619 Cortical blindness, unspecified side of brain: Secondary | ICD-10-CM

## 2022-05-29 NOTE — Progress Notes (Signed)
Location:  Other [facility]) Nursing Home Room Number: 405-A Place of Service:  SNF [phone]) Provider:  Tomasa Rand, MD  Patient Care Team: Dewayne Shorter, MD as PCP - General (Family Medicine) Wellington Hampshire, MD as PCP - Cardiology (Cardiology)  Extended Emergency Contact Information Primary Emergency Contact: Antonieta Loveless of [place] Phone: [phone] Mobile Phone: [phone] Relation: Sister Secondary Emergency Contact: Ranae Pila Address: [address] [place], [place] [address] Johnnette Litter of [place] Phone: [phone] Relation: Mother  Code Status:  DNR Goals of care: Advanced Directive information    05/29/2022   10:02 AM  Advanced Directives  Does Patient Have a Medical Advance Directive? Yes  Type of Advance Directive Out of facility DNR (pink MOST or yellow form)  Does patient want to make changes to medical advance directive? No - Patient declined    Chief Complaint  Patient presents with   Medical Management of Chronic Issues    HPI:  Pt is a 61 y.o. female seen today for a routine visit for bladder pain. Patient has a chronic indwelling catheter in place. She has had it for 8 years. 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 When all of that happened she had been working and she lost all of her possessions, cat, ect. 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  She has allergies and uses Flonase. Benadryl only for when she has morphine problems.   ED visit yesterday for bladder pain. Started on antibiotic for CAUTI. Urine culture grew Pseudomonas Aeruginosa. Sensitivities pending. She could tell a difference with the very first pill.  She grew up in [facility] [place] [facility] [place].   Denies chest pain, shortness of breath. She uses suppositories daily for difficulties with bowel movements  etc.   She worked her way through school. Very hard working. 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 She discovered a way to do immunohistochemical staining for TNF-alpha.  She is alert and oriented x3. 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   Past Medical History:  Diagnosis Date   Acute ST elevation myocardial infarction (STEMI) of inferolateral wall ([place]) 01/25/2020   Blind    Cholecystitis 04/22/2021   History of migraine headaches    Hyperlipemia    Myocardial infarction [facility])    Stroke [facility])    Past Surgical History:  Procedure Laterality Date   ABDOMINAL HYSTERECTOMY     BACK SURGERY     CARDIAC CATHETERIZATION     CORONARY/GRAFT ACUTE MI REVASCULARIZATION N/A 01/25/2020   Procedure: Coronary/Graft Acute MI Revascularization;  Surgeon: Wellington Hampshire, MD;  Location: [place] [facility];  Service: Cardiovascular;  Laterality: N/A;   FRACTURE SURGERY     IR CATHETER TUBE CHANGE  08/10/2021   LEFT HEART CATH AND CORONARY ANGIOGRAPHY N/A 01/25/2020   Procedure: LEFT HEART CATH AND CORONARY ANGIOGRAPHY;  Surgeon: Wellington Hampshire, MD;  Location: [place] [facility];  Service: Cardiovascular;  Laterality: N/A;   SKIN GRAFT Right     Allergies  Allergen Reactions   Levaquin [Levofloxacin In D5w] Anaphylaxis and Swelling   Iodinated Contrast Media Itching    Severe itching   Other     Dust mites and opiates (all of them)   Latex Dermatitis  Outpatient Encounter Medications as of 05/29/2022  Medication Sig   aspirin EC 81 MG tablet Take 81 mg by mouth daily. Swallow whole.   Azelastine HCl 0.15 % SOLN Place 2 sprays into both nostrils 2 (two) times daily.   bisacodyl (DULCOLAX) 10 MG suppository Place 10 mg rectally as needed for moderate constipation.   cefdinir (OMNICEF) 300 MG capsule Take 1 capsule (300 mg total) by mouth 2 (two) times daily.   clonazePAM (KLONOPIN) 0.5 MG tablet Take 1 tablet (0.5 mg total) by  mouth at bedtime.   diphenhydrAMINE (BENADRYL) 25 MG tablet Take 25 mg by mouth every 8 (eight) hours as needed.   guaiFENesin (ROBITUSSIN) 100 MG/5ML liquid Take 5 mLs by mouth every 4 (four) hours as needed for cough or to loosen phlegm.   Hydrocortisone Acetate (VAGISIL) 1 % CREA Apply 1 Application topically every 12 (twelve) hours as needed (For itching).   ibuprofen (ADVIL) 800 MG tablet Take 800 mg by mouth 3 (three) times daily.   lansoprazole (PREVACID) 15 MG capsule Take 15 mg by mouth daily at 12 noon.    levothyroxine (SYNTHROID) 25 MCG tablet Take 25 mcg by mouth daily before breakfast.   magnesium citrate SOLN Take 5 mLs by mouth as directed. every 168 hours as needed for CONSTIPATION   magnesium hydroxide (MILK OF MAGNESIA) 400 MG/5ML suspension Take 5 mLs by mouth every 6 (six) hours as needed for mild constipation.   Meth-Hyo-M Bl-Na Phos-Ph Sal (URIBEL) 118 MG CAPS Take 1 capsule (118 mg total) by mouth 4 (four) times daily as needed.   nitroGLYCERIN (NITROSTAT) 0.4 MG SL tablet Place 0.4 mg under the tongue every 5 (five) minutes x 3 doses as needed for chest pain.    nystatin-triamcinolone ointment (MYCOLOG) Apply 1 application topically 2 (two) times daily.   ondansetron (ZOFRAN-ODT) 4 MG disintegrating tablet Take 4 mg by mouth every 8 (eight) hours as needed for nausea or vomiting.   tiZANidine (ZANAFLEX) 2 MG tablet Take 2 mg by mouth every 8 (eight) hours as needed for muscle spasms.   Vibegron (GEMTESA) 75 MG TABS Take 75 mg by mouth daily.   Vitamin D, Ergocalciferol, (DRISDOL) 50000 units CAPS capsule Take 50,000 Units by mouth every Monday.   [DISCONTINUED] conjugated estrogens (PREMARIN) vaginal cream Apply 0.5mg  (pea-sized amount)  just inside the vaginal introitus with a finger-tip on  Monday, Wednesday and Friday nights.   [DISCONTINUED] diazepam (VALIUM) 10 MG tablet Take 10 mg by mouth every 6 (six) hours as needed for anxiety.   [DISCONTINUED] doxycycline  (VIBRAMYCIN) 100 MG capsule Take 1 capsule (100 mg total) by mouth every 12 (twelve) hours.   [DISCONTINUED] metFORMIN (GLUCOPHAGE) 500 MG tablet Take 500 mg by mouth 2 (two) times daily.   [DISCONTINUED] promethazine (PHENERGAN) 25 MG/ML injection Inject 25 mg into the vein every 6 (six) hours as needed for nausea or vomiting.   [DISCONTINUED] sennosides-docusate sodium (SENOKOT-S) 8.6-50 MG tablet Take 1 tablet by mouth in the morning and at bedtime.   [DISCONTINUED] ticagrelor (BRILINTA) 60 MG TABS tablet Take 1 tablet by mouth 2 (two) times daily.   No facility-administered encounter medications on file as of 05/29/2022.    Review of Systems  All other systems reviewed and are negative.   Immunization History  Administered Date(s) Administered   Influenza-Unspecified 04/28/2011, 06/14/2012   Moderna Sars-Covid-2 Vaccination 09/08/2019   Tdap 09/03/2013, 11/15/2016   Pertinent  Health Maintenance Due  Topic Date Due   FOOT EXAM  Never  done   OPHTHALMOLOGY EXAM  Never done   PAP SMEAR-Modifier  Never done   COLONOSCOPY (Pts 45-47yrs Insurance coverage will need to be confirmed)  Never done   MAMMOGRAM  Never done   HEMOGLOBIN A1C  07/26/2020   INFLUENZA VACCINE  02/28/2022      10/09/2021    5:42 AM 11/26/2021    2:10 AM 02/09/2022    1:51 AM 03/01/2022    7:43 AM 05/28/2022    2:13 AM  Fall Risk  Patient Fall Risk Level Moderate fall risk High fall risk Moderate fall risk Moderate fall risk Moderate fall risk   Functional Status Survey:    Vitals:   05/29/22 0946  BP: (!) 140/80  Pulse: 80  Resp: 20  Temp: 98.1 F (36.7 C)  SpO2: 95%  Height: 5\' 1"  (1.549 m)   Body mass index is 37.49 kg/m. Physical Exam Vitals reviewed.  Eyes:     Comments: Dis-concordant gaze with erratic eye movements.   Cardiovascular:     Rate and Rhythm: Normal rate and regular rhythm.     Pulses: Normal pulses.  Pulmonary:     Effort: Pulmonary effort is normal.     Breath sounds:  Normal breath sounds.  Abdominal:     General: Abdomen is flat.     Palpations: Abdomen is soft.  Skin:    Capillary Refill: Capillary refill takes less than 2 seconds.  Neurological:     Mental Status: She is alert and oriented to person, place, and time.     Labs reviewed: Recent Labs    10/09/21 0530 03/01/22 1251 04/13/22 0000 05/28/22 0220  NA 132* 130* 134* 135  K 3.9 3.5 4.2 3.2*  CL 99 95* 99 100  CO2 25 26 27* 27  GLUCOSE 118* 119*  --  137*  BUN 8 <5* 11 14  CREATININE 0.94 0.81 0.8 0.98  CALCIUM 8.9 8.9 8.8 8.6*   Recent Labs    10/09/21 0530 03/01/22 1251 04/13/22 0000  AST 15 25 11*  ALT 8 8 7   ALKPHOS 79 73 101  BILITOT 0.5 0.6  --   PROT 7.2 7.2  --   ALBUMIN 3.8 4.2 3.7   Recent Labs    10/09/21 0530 03/01/22 1251 04/13/22 0000 05/28/22 0220  WBC 9.9 8.7 10.0 10.8*  NEUTROABS 7.1 7.0  --   --   HGB 7.8* 9.0* 8.8* 10.0*  HCT 27.7* 30.2* 29* 33.8*  MCV 63.5* 65.4*  --  68.6*  PLT 576* 524* 419* 436*   No results found for: "TSH" Lab Results  Component Value Date   HGBA1C 6.1 (H) 01/25/2020   Lab Results  Component Value Date   CHOL 310 (H) 01/26/2020   HDL 59 01/26/2020   LDLCALC 197 (H) 01/26/2020   TRIG 269 (H) 01/26/2020   CHOLHDL 5.3 01/26/2020    Significant Diagnostic Results in last 30 days:  CT RENAL STONE STUDY  Result Date: 05/23/2022 CLINICAL DATA:  Severe bladder pain when changing suprapubic catheter EXAM: CT ABDOMEN AND PELVIS WITHOUT CONTRAST TECHNIQUE: Multidetector CT imaging of the abdomen and pelvis was performed following the standard protocol without IV contrast. RADIATION DOSE REDUCTION: This exam was performed according to the departmental dose-optimization program which includes automated exposure control, adjustment of the mA and/or kV according to patient size and/or use of iterative reconstruction technique. COMPARISON:  02/09/2022 FINDINGS: Lower chest: No acute abnormality.  Coronary artery  calcifications. Hepatobiliary: No solid liver abnormality is seen. Gallstones.  No gallbladder wall thickening, or biliary dilatation. Pancreas: Unremarkable. No pancreatic ductal dilatation or surrounding inflammatory changes. Spleen: Normal in size without significant abnormality. Adrenals/Urinary Tract: Adrenal glands are unremarkable. Kidneys are normal, without renal calculi, solid lesion, or hydronephrosis. Bladder is decompressed and contains a suprapubic catheter. No evident abnormality of the catheter or tract. Stomach/Bowel: Stomach is within normal limits. Appendix appears normal. No evidence of bowel wall thickening, distention, or inflammatory changes. Vascular/Lymphatic: Aortic atherosclerosis. No enlarged abdominal or pelvic lymph nodes. Reproductive: Status post hysterectomy. Other: Diastasis rectus with a left-sided spigelian hernia containing nonobstructed sigmoid colon (series 2, image 60). No ascites. Musculoskeletal: No acute or significant osseous findings. IMPRESSION: 1. Bladder is decompressed and contains a suprapubic catheter. No evident abnormality of the catheter or tract. 2. 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 3. Cholelithiasis. 4. Coronary artery disease. Aortic Atherosclerosis (ICD10-I70.0). Electronically Signed   By: Jearld Lesch M.D.   On: 05/23/2022 10:25    Assessment/Plan 1. Suprapubic catheter (HCC) 2. Urinary tract infection associated with cystostomy catheter, subsequent encounter Patient with suprapubic catheter for 8 years since her stroke. She had significant pain in the suprapubic area. No fevers, chills. CBC notable for slight leukocytosis. Urine culture shows Pseudomonas aeruginosa. Susceptibilities are pending. For now, will continue Omnicef 300 mg BID for 10 days given CAUTI. CTM for systemic symptoms. Continue vibegron 75 mg daily.   3. History of stroke 10. Slurred speech  7. 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 Continues to have slurred speech. 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 Continue ASA 81 mg daily. Continue PRN tizanidine and clonazepam for spaciticity.   4. PE (pulmonary thromboembolism) (HCC) Hx of PE. No signs or symptoms of PE or DVT at this time.   5. Acute ST elevation myocardial infarction (STEMI) of inferolateral wall (HCC) No symptoms at this time. Continue PRN nitroglycerin 0.4 mg SL tablet. Continue ASA 81 mg.   6. Essential hypertension BP slightly elevated today. BP fluctuates greatly per chart review, with some normal range and some elevated. Initial encounter with patient. She was prevoiusly on valsartan for BP control. Patient will follow up with NP in a couple of weeks to recheck blood pressures. If persistently elevated above 135/90, will likely need to restart a medication. Collect BMP and TSH. Continue discussion for discontinuing ibuprofen given impact on kidney function and BP.   8. Hypothyroidism (acquired) Patient has been stable on 25 mcg of levothyroxine. Recent TSH 3.1 with former PCP. Will plan to continue for now. Recheck TSH.   9. Diabetes mellitus type 2 in obese (HCC) A1c 5.7 03/01/2022  10. GERD Symptoms well-controlled with prevacid 15 mg capsules.   11. Constipation Patient has required daily suppository since her stroke. Likely neurogenic based on her bladder spacticity as well. Continue Daily dulcolax, PRN MOM. PRN mag Citrate.     Family/ staff Communication: nursing  Labs/tests ordered:  CBC, BMP, A1c, TSH  Coralyn Helling, MD, [facility] [phone]

## 2022-05-31 LAB — URINE CULTURE: Culture: >=100000 — AB

## 2022-06-01 LAB — BASIC METABOLIC PANEL
BUN: 7 (ref 4–21)
CO2: 26 — AB (ref 13–22)
Chloride: 100 (ref 99–108)
Creatinine: 0.9 (ref 0.5–1.1)
Glucose: 100
Potassium: 4.1 mEq/L (ref 3.5–5.1)
Sodium: 135 — AB (ref 137–147)

## 2022-06-01 LAB — CBC AND DIFFERENTIAL
HCT: 31 — AB (ref 36–46)
Hemoglobin: 9.3 — AB (ref 12.0–16.0)
Neutrophils Absolute: 5165
Platelets: 419 10*3/uL — AB (ref 150–400)
WBC: 7.4

## 2022-06-01 LAB — HEPATIC FUNCTION PANEL
ALT: 6 U/L — AB (ref 7–35)
AST: 12 — AB (ref 13–35)
Alkaline Phosphatase: 86 (ref 25–125)
Bilirubin, Total: 0.4

## 2022-06-01 LAB — COMPREHENSIVE METABOLIC PANEL
Albumin: 3.8 (ref 3.5–5.0)
Calcium: 8.8 (ref 8.7–10.7)
Globulin: 2.8
eGFR: 72

## 2022-06-01 LAB — TSH: TSH: 4.24 (ref 0.41–5.90)

## 2022-06-01 LAB — CBC: RBC: 4.62 (ref 3.87–5.11)

## 2022-06-01 LAB — HEMOGLOBIN A1C: Hemoglobin A1C: 6.3

## 2022-06-02 NOTE — Progress Notes (Signed)
ED Antimicrobial Stewardship Positive Culture Follow Up   Victoria Medina is an 61 y.o. female who presented to [facility] on 05/28/2022 with a chief complaint of  Chief Complaint  Patient presents with   Bladder Spasms    Recent Results (from the past 720 hour(s))  CULTURE, URINE COMPREHENSIVE     Status: Abnormal   Collection Time: 05/09/22  3:18 PM   Specimen: Urine   UR  Result Value Ref Range Status   Urine Culture, Comprehensive Final report (A)  Final   Organism ID, Bacteria Comment (A)  Final    Comment: Pseudomonas aeruginosa 50,000-100,000 colony forming units per mL    ANTIMICROBIAL SUSCEPTIBILITY Comment  Final    Comment:       ** S = Susceptible; I = Intermediate; R = Resistant **                    P = Positive; N = Negative             MICS are expressed in micrograms per mL    Antibiotic                 RSLT#1    RSLT#2    RSLT#3    RSLT#4 Amikacin                       S Cefepime                       S Ceftazidime                    S Ciprofloxacin                  S Gentamicin                     S Imipenem                       S Levofloxacin                   S Meropenem                      S Piperacillin                   S Ticarcillin                    S Tobramycin                     S   Microscopic Examination     Status: Abnormal   Collection Time: 05/09/22  3:18 PM   Urine  Result Value Ref Range Status   WBC, UA 11-30 (A) 0 - 5 /hpf Final   RBC, Urine 3-10 (A) 0 - 2 /hpf Final   Epithelial Cells (non renal) 0-10 0 - 10 /hpf Final   Crystals Present (A) N/A Final   Crystal Type Amorphous Sediment N/A Final   Bacteria, UA Moderate (A) None seen/Few Final  Urine Culture     Status: Abnormal   Collection Time: 05/28/22  3:41 AM   Specimen: Urine, Random  Result Value Ref Range Status   Specimen Description   Final    URINE, RANDOM Performed at [facility], [address] [place], [place] [address]    Special Requests  Final     NONE Performed at [facility], [address] [place], [place] [address]    Culture >=100,000 COLONIES/mL PSEUDOMONAS AERUGINOSA (A)  Final   Report Status 05/31/2022 FINAL  Final   Organism ID, Bacteria PSEUDOMONAS AERUGINOSA (A)  Final      Susceptibility   Pseudomonas aeruginosa - MIC*    CEFTAZIDIME <=1 SENSITIVE Sensitive     CIPROFLOXACIN <=0.25 SENSITIVE Sensitive     GENTAMICIN <=1 SENSITIVE Sensitive     IMIPENEM 2 SENSITIVE Sensitive     PIP/TAZO <=4 SENSITIVE Sensitive     CEFEPIME 2 SENSITIVE Sensitive     * >=100,000 COLONIES/mL PSEUDOMONAS AERUGINOSA    [x]  Treated with cefdinir 300 mg twice daily, organism resistant to prescribed antimicrobial []  Patient discharged originally without antimicrobial agent and treatment is now indicated  Discussed culture results with provider. No orders for any changes.  Provider: Dewayne Shorter, MD  Dara Hoyer, PharmD PGY-1 Pharmacy Resident 06/02/2022 3:18 PM

## 2022-06-05 ENCOUNTER — Telehealth: Payer: Self-pay

## 2022-06-05 NOTE — Progress Notes (Unsigned)
Suprapubic Cath Change  Patient is present today for a suprapubic catheter change due to urinary retention.  8 ml of water was drained from the balloon, a 18 FR Silastic foley cath was removed from the tract with out difficulty.  Site was cleaned and prepped in a sterile fashion with betadine.  A 18 FR Silastic foley cath was replaced into the tract no complications were noted. Urine return was noted, 10 ml of sterile water was inflated into the balloon and a night bag was attached for drainage.  Patient tolerated well.   Performed by: Zara Council, PA-C   Follow up: She has been seen in the emergency department since her last catheter exchange for bladder spasms.  Urinalysis showed straw hazy urine, specific gravity of 1.003, pH 6.0, moderate blood, nitrite positive, large leukocytes, 11-30 RBCs, greater than 50 WBCs, rare bacteria and mucus present.  Urine culture results positive for pseudomonas aeruginosa.  She was given cefdinir 300 mg twice daily for 10 days.    She continues to have abdominal pain and exchanging suprapubic tube was painful for her today.  She states that the bladder spasms she was having that caused her to seek treatment in the ED have resolved with the antibiotic.  Today's UA pending.   Cysto (05/2021) NED   CT renal stone study (04/2022) noted diastasis rectus with a left-sided spigelian hernia containing nonobstructed sigmoid colon.  I am not very familiar with this type of hernia and not certain whether or not this is contributing to her pain during suprapubic catheter exchanges and at other times.  I will go ahead and refer to general surgery to obtain their opinion.  She declined referral to general surgery stating she was not going to have the hernia repaired because in the past, general anesthesia does not put her to sleep and she is awake during the entire surgery.    I would recommend to observe for any signs of an incarcerated/strangulated hernia, such as intense  abdominal pain, nausea and vomiting, constipation or diarrhea, a hard painful knot in the abdomen that changes color, fever and/or chills.  If she experiences any of these to seek care in the emergency department immediately.    I spent 30 minutes on the day of the encounter to include pre-visit record review, face-to-face time with the patient, and post-visit ordering of tests.

## 2022-06-05 NOTE — Telephone Encounter (Signed)
Patient Victoria Medina on triage line 06/02/22 requesting results from CT scan. Please advise.

## 2022-06-06 ENCOUNTER — Ambulatory Visit (INDEPENDENT_AMBULATORY_CARE_PROVIDER_SITE_OTHER): Payer: Medicare Other | Admitting: Urology

## 2022-06-06 DIAGNOSIS — Z466 Encounter for fitting and adjustment of urinary device: Secondary | ICD-10-CM | POA: Diagnosis not present

## 2022-06-06 DIAGNOSIS — R3129 Other microscopic hematuria: Secondary | ICD-10-CM | POA: Diagnosis not present

## 2022-06-06 DIAGNOSIS — R339 Retention of urine, unspecified: Secondary | ICD-10-CM

## 2022-06-06 DIAGNOSIS — R301 Vesical tenesmus: Secondary | ICD-10-CM | POA: Diagnosis not present

## 2022-06-06 LAB — URINALYSIS, COMPLETE
Bilirubin, UA: NEGATIVE
Glucose, UA: NEGATIVE
Ketones, UA: NEGATIVE
Nitrite, UA: NEGATIVE
Protein,UA: NEGATIVE
Specific Gravity, UA: <1.005 — ABNORMAL LOW (ref 1.005–1.030)
Urobilinogen, Ur: 0.2 mg/dL (ref 0.2–1.0)
pH, UA: 6 (ref 5.0–7.5)

## 2022-06-06 LAB — MICROSCOPIC EXAMINATION: RBC, Urine: NONE SEEN /hpf (ref 0–2)

## 2022-06-09 ENCOUNTER — Non-Acute Institutional Stay (SKILLED_NURSING_FACILITY): Payer: Medicare Other | Admitting: Student

## 2022-06-09 ENCOUNTER — Encounter: Payer: Self-pay | Admitting: Student

## 2022-06-09 DIAGNOSIS — R441 Visual hallucinations: Secondary | ICD-10-CM | POA: Diagnosis not present

## 2022-06-09 DIAGNOSIS — I5020 Unspecified systolic (congestive) heart failure: Secondary | ICD-10-CM | POA: Insufficient documentation

## 2022-06-09 DIAGNOSIS — F514 Sleep terrors [night terrors]: Secondary | ICD-10-CM

## 2022-06-09 DIAGNOSIS — R440 Auditory hallucinations: Secondary | ICD-10-CM | POA: Diagnosis not present

## 2022-06-09 DIAGNOSIS — I5022 Chronic systolic (congestive) heart failure: Secondary | ICD-10-CM | POA: Insufficient documentation

## 2022-06-09 DIAGNOSIS — F331 Major depressive disorder, recurrent, moderate: Secondary | ICD-10-CM | POA: Diagnosis not present

## 2022-06-09 NOTE — Progress Notes (Signed)
Location:  Other [place] Room Number: [place] [address] Place of Service:  SNF [phone]) Provider:  Dr. Amada Kingfisher, MD  Patient Care Team: Dewayne Shorter, MD as PCP - General (Family Medicine) Wellington Hampshire, MD as PCP - Cardiology (Cardiology)  Extended Emergency Contact Information Primary Emergency Contact: Antonieta Loveless of [place] Phone: [phone] Mobile Phone: [phone] Relation: Sister Secondary Emergency Contact: Ranae Pila Address: [address] [place], [place] [address] Johnnette Litter of [place] Phone: [phone] Relation: Mother  Code Status:  DNR Goals of care: Advanced Directive information    06/09/2022    1:11 PM  Advanced Directives  Does Patient Have a Medical Advance Directive? Yes  Type of Advance Directive Out of facility DNR (pink MOST or yellow form)  Does patient want to make changes to medical advance directive? No - Patient declined     Chief Complaint  Patient presents with   Acute Visit    Altered Mental Status    HPI:  Pt is a 61 y.o. female seen today for an acute visit for changes in mental status.   Patient states she has had constipation since her strokes. Now has a hernia seen on CT scan. She is wondering if the UTI was even treated   This week she has not felt like herself. It's been a bad week. Having issues with her older sister. She has had night terrors in the past. She says they are so scary. She takes her clonazepam 2 mg nightly. When she has night terror she has visual and auditory hallucinations. She will be asleep and think she is awake. Sometimes she can snap herself out of it, but not always. She sees things in her sleep and feels like they are coming true. She is having true hallucinations.   I'd rather have something occasionally than take a drug that messess with my brain.   Denies homicidal or suicidal thoughts.   She feels like  a prisoner here and is unhappy, but doesn't want to kill herself. She was on trazodone before and doesn't want a medication for depression at this time. She has had CBT before  Yes she has had hallucinations. Chicken feathers everywhere. She sees them all over.   Has she ever taken prazosin? She says alpha blockers could set off a migraine.     Past Medical History:  Diagnosis Date   Acute ST elevation myocardial infarction (STEMI) of inferolateral wall ([place]) 01/25/2020   Blind    Cholecystitis 04/22/2021   History of migraine headaches    Hyperlipemia    Myocardial infarction [facility])    Stroke [facility])    Past Surgical History:  Procedure Laterality Date   ABDOMINAL HYSTERECTOMY     BACK SURGERY     CARDIAC CATHETERIZATION     CORONARY/GRAFT ACUTE MI REVASCULARIZATION N/A 01/25/2020   Procedure: Coronary/Graft Acute MI Revascularization;  Surgeon: Wellington Hampshire, MD;  Location: [place] LAB;  Service: Cardiovascular;  Laterality: N/A;   FRACTURE SURGERY     IR CATHETER TUBE CHANGE  08/10/2021   LEFT HEART CATH AND CORONARY ANGIOGRAPHY N/A 01/25/2020   Procedure: LEFT HEART CATH AND CORONARY ANGIOGRAPHY;  Surgeon: Wellington Hampshire, MD;  Location: [place] CV [facility];  Service: Cardiovascular;  Laterality: N/A;   SKIN GRAFT Right     Allergies  Allergen Reactions   Levaquin [Levofloxacin In D5w] Anaphylaxis and Swelling  Iodinated Contrast Media Itching    Severe itching   Other     Dust mites and opiates (all of them)   Latex Dermatitis    Outpatient Encounter Medications as of 06/09/2022  Medication Sig   aluminum hydroxide (DERMAGRAN) ointment Apply to Supra Pubic site topically every 24 hours as needed   aspirin EC 81 MG tablet Take 81 mg by mouth daily. Swallow whole.   Azelastine HCl 0.15 % SOLN Place 2 sprays into both nostrils 2 (two) times daily.   bisacodyl (DULCOLAX) 10 MG suppository Place 10 mg rectally as needed for moderate constipation.   clonazePAM  (KLONOPIN) 0.5 MG tablet Take 1 tablet (0.5 mg total) by mouth at bedtime.   diazepam (VALIUM) 10 MG tablet Take 10 mg by mouth 4 (four) times daily.   diphenhydrAMINE (BENADRYL) 25 MG tablet Take 25 mg by mouth every 8 (eight) hours as needed.   guaiFENesin (ROBITUSSIN) 100 MG/5ML liquid Take 10 mLs by mouth every 4 (four) hours as needed for cough or to loosen phlegm.   Hydrocortisone Acetate (VAGISIL) 1 % CREA Apply 1 Application topically every 12 (twelve) hours as needed (For itching).   ibuprofen (ADVIL) 800 MG tablet Take 800 mg by mouth 3 (three) times daily.   lansoprazole (PREVACID) 15 MG capsule Take 15 mg by mouth daily at 12 noon.    levothyroxine (SYNTHROID) 25 MCG tablet Take 25 mcg by mouth daily before breakfast.   magnesium citrate SOLN Take 5 mLs by mouth as directed. every 168 hours as needed for CONSTIPATION   magnesium hydroxide (MILK OF MAGNESIA) 400 MG/5ML suspension Take 5 mLs by mouth every 6 (six) hours as needed for mild constipation.   Meth-Hyo-M Bl-Na Phos-Ph Sal (URIBEL) 118 MG CAPS Take 1 capsule (118 mg total) by mouth 4 (four) times daily as needed.   nitroGLYCERIN (NITROSTAT) 0.4 MG SL tablet Place 0.4 mg under the tongue every 5 (five) minutes x 3 doses as needed for chest pain.    nystatin-triamcinolone ointment (MYCOLOG) Apply 1 application topically 2 (two) times daily.   ondansetron (ZOFRAN-ODT) 4 MG disintegrating tablet Take 4 mg by mouth every 8 (eight) hours as needed for nausea or vomiting.   tiZANidine (ZANAFLEX) 2 MG tablet Take 2 mg by mouth every 8 (eight) hours as needed for muscle spasms.   Vibegron (GEMTESA) 75 MG TABS Take 75 mg by mouth daily.   Vitamin D, Ergocalciferol, (DRISDOL) 50000 units CAPS capsule Take 50,000 Units by mouth every Monday.   [DISCONTINUED] cefdinir (OMNICEF) 300 MG capsule Take 1 capsule (300 mg total) by mouth 2 (two) times daily.   No facility-administered encounter medications on file as of 06/09/2022.    Review of  Systems  Immunization History  Administered Date(s) Administered   Influenza-Unspecified 04/28/2011, 06/14/2012   Moderna Sars-Covid-2 Vaccination 09/08/2019   Tdap 09/03/2013, 11/15/2016   Pertinent  Health Maintenance Due  Topic Date Due   FOOT EXAM  Never done   OPHTHALMOLOGY EXAM  Never done   PAP SMEAR-Modifier  Never done   COLONOSCOPY (Pts 45-59yrs Insurance coverage will need to be confirmed)  Never done   MAMMOGRAM  Never done   HEMOGLOBIN A1C  11/30/2022   INFLUENZA VACCINE  Discontinued      10/09/2021    5:42 AM 11/26/2021    2:10 AM 02/09/2022    1:51 AM 03/01/2022    7:43 AM 05/28/2022    2:13 AM  Fall Risk  Patient Fall Risk Level Moderate fall  risk High fall risk Moderate fall risk Moderate fall risk Moderate fall risk   Functional Status Survey:    There were no vitals filed for this visit. There is no height or weight on file to calculate BMI. Physical Exam Cardiovascular:     Rate and Rhythm: Normal rate and regular rhythm.  Neurological:     Mental Status: She is alert.     Comments: Delirium evaluation: Alert Day of the week - Friday Year - 2035 [facility] Can say months of the year forward, will not attempt backwards. Endorses longstanding history of auditory and visual hallucinations.       Labs reviewed: Recent Labs    10/09/21 0530 03/01/22 1251 04/13/22 0000 05/28/22 0220 06/01/22 0000  NA 132* 130* 134* 135 135*  K 3.9 3.5 4.2 3.2* 4.1  CL 99 95* 99 100 100  CO2 25 26 27* 27 26*  GLUCOSE 118* 119*  --  137*  --   BUN 8 <5* 11 14 7   CREATININE 0.94 0.81 0.8 0.98 0.9  CALCIUM 8.9 8.9 8.8 8.6* 8.8   Recent Labs    10/09/21 0530 03/01/22 1251 04/13/22 0000 06/01/22 0000  AST 15 25 11* 12*  ALT 8 8 7  6*  ALKPHOS 79 73 101 86  BILITOT 0.5 0.6  --   --   PROT 7.2 7.2  --   --   ALBUMIN 3.8 4.2 3.7 3.8   Recent Labs    10/09/21 0530 03/01/22 1251 04/13/22 0000 05/28/22 0220 06/01/22 0000  WBC 9.9 8.7 10.0 10.8* 7.4   NEUTROABS 7.1 7.0  --   --  5,165.00  HGB 7.8* 9.0* 8.8* 10.0* 9.3*  HCT 27.7* 30.2* 29* 33.8* 31*  MCV 63.5* 65.4*  --  68.6*  --   PLT 576* 524* 419* 436* 419*   Lab Results  Component Value Date   TSH 4.24 06/01/2022   Lab Results  Component Value Date   HGBA1C 6.3 06/01/2022   Lab Results  Component Value Date   CHOL 310 (H) 01/26/2020   HDL 59 01/26/2020   LDLCALC 197 (H) 01/26/2020   TRIG 269 (H) 01/26/2020   CHOLHDL 5.3 01/26/2020    Significant Diagnostic Results in last 30 days:  CT RENAL STONE STUDY  Result Date: 05/23/2022 CLINICAL DATA:  Severe bladder pain when changing suprapubic catheter EXAM: CT ABDOMEN AND PELVIS WITHOUT CONTRAST TECHNIQUE: Multidetector CT imaging of the abdomen and pelvis was performed following the standard protocol without IV contrast. RADIATION DOSE REDUCTION: This exam was performed according to the departmental dose-optimization program which includes automated exposure control, adjustment of the mA and/or kV according to patient size and/or use of iterative reconstruction technique. COMPARISON:  02/09/2022 FINDINGS: Lower chest: No acute abnormality.  Coronary artery calcifications. Hepatobiliary: No solid liver abnormality is seen. Gallstones. No gallbladder wall thickening, or biliary dilatation. Pancreas: Unremarkable. No pancreatic ductal dilatation or surrounding inflammatory changes. Spleen: Normal in size without significant abnormality. Adrenals/Urinary Tract: Adrenal glands are unremarkable. Kidneys are normal, without renal calculi, solid lesion, or hydronephrosis. Bladder is decompressed and contains a suprapubic catheter. No evident abnormality of the catheter or tract. Stomach/Bowel: Stomach is within normal limits. Appendix appears normal. No evidence of bowel wall thickening, distention, or inflammatory changes. Vascular/Lymphatic: Aortic atherosclerosis. No enlarged abdominal or pelvic lymph nodes. Reproductive: Status post  hysterectomy. Other: Diastasis rectus with a left-sided spigelian hernia containing nonobstructed sigmoid colon (series 2, image 60). No ascites. Musculoskeletal: No acute or significant osseous findings.  IMPRESSION: 1. Bladder is decompressed and contains a suprapubic catheter. No evident abnormality of the catheter or tract. 2. 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 3. Cholelithiasis. 4. Coronary artery disease. Aortic Atherosclerosis (ICD10-I70.0). Electronically Signed   By: Delanna Ahmadi M.D.   On: 05/23/2022 10:25    Assessment/Plan 1. Hallucinations, visual 2. Auditory hallucinations 4. Major depressive disorder, recurrent, moderate (HCC) 5. Night Terrors.  Patient with longstanding history of hallucinations she states they are scary but she will not have treatment. She is most interested in pursuing CBT since that has been successful in the past. She is open to Video visits since she is blind, but would need assistance with coordination of this type of visit. For now will not make medication changes or additional work up since she is at her baseline orientation. Continue clonazepam nightly for night terrors. Will coordinate with social work Advertising account executive to schedule a therapy appointment. Will order behavioral health referral   3. HFrEF (heart failure with reduced ejection fraction) ([place]) Patient appears euvolemic on exam. Kidney function stable. CTM.     No results found for: "CBC" Creatinine  Date/Time Value Ref Range Status  06/01/2022 12:00 AM 0.9 0.5 - 1.1 Final   Creatinine, Ser  Date/Time Value Ref Range Status  05/28/2022 02:20 AM 0.98 0.44 - 1.00 mg/dL Final     Family/ staff Communication: nursing  Labs/tests ordered:  none

## 2022-07-05 NOTE — Progress Notes (Unsigned)
Suprapubic Cath Change  Patient is present today for a suprapubic catheter change due to urinary retention.  8 ml of water was drained from the balloon, a 18 FR Silastic foley cath was removed from the tract with out difficulty.  Site was cleaned and prepped in a sterile fashion with betadine.  A 18 FR Silastic foley cath was replaced into the tract no complications were noted. Urine return was noted, 10 ml of sterile water was inflated into the balloon and a night bag was attached for drainage.  Patient tolerated well.   Performed by: Michiel Cowboy, PA-C and Lizbeth Bark, CMA   Follow up: One month for SPT exchange

## 2022-07-06 ENCOUNTER — Ambulatory Visit: Payer: Medicare Other | Admitting: Urology

## 2022-07-06 ENCOUNTER — Ambulatory Visit (INDEPENDENT_AMBULATORY_CARE_PROVIDER_SITE_OTHER): Payer: Medicare Other | Admitting: Urology

## 2022-07-06 DIAGNOSIS — N319 Neuromuscular dysfunction of bladder, unspecified: Secondary | ICD-10-CM

## 2022-07-06 DIAGNOSIS — R339 Retention of urine, unspecified: Secondary | ICD-10-CM | POA: Diagnosis not present

## 2022-07-25 ENCOUNTER — Other Ambulatory Visit: Payer: Self-pay | Admitting: Adult Health

## 2022-07-25 MED ORDER — DIAZEPAM 10 MG PO TABS: 10.0000 mg | ORAL_TABLET | Freq: Four times a day (QID) | ORAL | 0 refills | Status: DC

## 2022-07-27 ENCOUNTER — Non-Acute Institutional Stay (SKILLED_NURSING_FACILITY): Payer: Medicare Other | Admitting: Nurse Practitioner

## 2022-07-27 ENCOUNTER — Encounter: Payer: Self-pay | Admitting: Nurse Practitioner

## 2022-07-27 DIAGNOSIS — I10 Essential (primary) hypertension: Secondary | ICD-10-CM | POA: Diagnosis not present

## 2022-07-27 DIAGNOSIS — E1169 Type 2 diabetes mellitus with other specified complication: Secondary | ICD-10-CM

## 2022-07-27 DIAGNOSIS — Z9359 Other cystostomy status: Secondary | ICD-10-CM

## 2022-07-27 DIAGNOSIS — Z8673 Personal history of transient ischemic attack (TIA), and cerebral infarction without residual deficits: Secondary | ICD-10-CM

## 2022-07-27 DIAGNOSIS — F39 Unspecified mood [affective] disorder: Secondary | ICD-10-CM

## 2022-07-27 DIAGNOSIS — E782 Mixed hyperlipidemia: Secondary | ICD-10-CM | POA: Diagnosis not present

## 2022-07-27 DIAGNOSIS — E039 Hypothyroidism, unspecified: Secondary | ICD-10-CM

## 2022-07-27 DIAGNOSIS — E669 Obesity, unspecified: Secondary | ICD-10-CM

## 2022-07-27 DIAGNOSIS — E559 Vitamin D deficiency, unspecified: Secondary | ICD-10-CM

## 2022-07-27 DIAGNOSIS — K219 Gastro-esophageal reflux disease without esophagitis: Secondary | ICD-10-CM

## 2022-07-27 NOTE — Progress Notes (Signed)
Location:  Other [facility] Room Number: [place] of Service:  SNF (31)  Dewayne Shorter, MD  Patient Care Team: Dewayne Shorter, MD as PCP - General (Family Medicine) Wellington Hampshire, MD as PCP - Cardiology (Cardiology)  Extended Emergency Contact Information Primary Emergency Contact: Antonieta Loveless of [place] Phone: [phone] Mobile Phone: [phone] Relation: Sister Secondary Emergency Contact: Ranae Pila Address: [address] [place], [place] [address] Johnnette Litter of [place] Phone: [phone] Relation: Mother  Goals of care: Advanced Directive information    07/27/2022    3:46 PM  Advanced Directives  Does Patient Have a Medical Advance Directive? Yes  Type of Advance Directive Out of facility DNR (pink MOST or yellow form)  Does patient want to make changes to medical advance directive? No - Patient declined  Pre-existing out of facility DNR order (yellow form or pink MOST form) Yellow form placed in chart (order not valid for inpatient use)     Chief Complaint  Patient presents with   Medical Management of Chronic Issues    Routine follow up visit   Immunizations    Hepatitis C screening, shingrix vaccine due   Quality Metric Gaps    Medicare annual wellness visit, foot exam, eye exam, diabetic kidney evaluation, Pap smear, colonoscopy and mammogram due    HPI:  Pt is a 61 y.o. female seen today for an routine follow up visit for medication management.  Pt reports she "does not have a life" ended in 2015 with her stoke. Denies depression, reports she would want to see a psychologist but nursing reports when appt was made she showed out in the waiting room and therefor was not seen.  She states she is in pain always due to her suprapubic catheter. She does not take ibuprofen (is now PRN) States she does not want to see me and does not consent for a physical exam.  She does report that her blood  pressure is fine where it is, will not take medications.  Also will not take medication to lower cholesterol. Has been educated on risk of stroke/MI    Past Medical History:  Diagnosis Date   Acute ST elevation myocardial infarction (STEMI) of inferolateral wall ([place]) 01/25/2020   Blind    Cholecystitis 04/22/2021   History of migraine headaches    Hyperlipemia    Myocardial infarction [facility])    Stroke [facility]    Past Surgical History:  Procedure Laterality Date   ABDOMINAL HYSTERECTOMY     BACK SURGERY     CARDIAC CATHETERIZATION     CORONARY/GRAFT ACUTE MI REVASCULARIZATION N/A 01/25/2020   Procedure: Coronary/Graft Acute MI Revascularization;  Surgeon: Wellington Hampshire, MD;  Location: [place] [facility];  Service: Cardiovascular;  Laterality: N/A;   FRACTURE SURGERY     IR CATHETER TUBE CHANGE  08/10/2021   LEFT HEART CATH AND CORONARY ANGIOGRAPHY N/A 01/25/2020   Procedure: LEFT HEART CATH AND CORONARY ANGIOGRAPHY;  Surgeon: Wellington Hampshire, MD;  Location: [place];  Service: Cardiovascular;  Laterality: N/A;   SKIN GRAFT Right     Allergies  Allergen Reactions   Levaquin [Levofloxacin In D5w] Anaphylaxis and Swelling   Iodinated Contrast Media Itching    Severe itching   Other     Dust mites and opiates (all of them)   Latex Dermatitis    Outpatient Encounter Medications as of 07/27/2022  Medication Sig  aluminum hydroxide (DERMAGRAN) ointment Apply to Supra Pubic site topically every 24 hours as needed   aspirin EC 81 MG tablet Take 81 mg by mouth daily. Swallow whole.   Azelastine HCl 0.15 % SOLN Place 2 sprays into both nostrils 2 (two) times daily.   bisacodyl (DULCOLAX) 10 MG suppository Place 10 mg rectally as needed for moderate constipation.   clonazePAM (KLONOPIN) 0.5 MG tablet Take 1 tablet (0.5 mg total) by mouth at bedtime.   diazepam (VALIUM) 10 MG tablet Take 1 tablet (10 mg total) by mouth 4 (four) times daily.   guaiFENesin (ROBITUSSIN)  100 MG/5ML liquid Take 10 mLs by mouth every 4 (four) hours as needed for cough or to loosen phlegm.   Hydrocortisone Acetate (VAGISIL) 1 % CREA Apply 1 Application topically every 12 (twelve) hours as needed (For itching).   ibuprofen (ADVIL) 800 MG tablet Take 800 mg by mouth 3 (three) times daily.   lansoprazole (PREVACID) 15 MG capsule Take 15 mg by mouth daily at 12 noon.    levothyroxine (SYNTHROID) 25 MCG tablet Take 25 mcg by mouth daily before breakfast.   magnesium citrate SOLN Take 5 mLs by mouth as directed. every 168 hours as needed for CONSTIPATION   magnesium hydroxide (MILK OF MAGNESIA) 400 MG/5ML suspension Take 5 mLs by mouth every 6 (six) hours as needed for mild constipation.   nitroGLYCERIN (NITROSTAT) 0.4 MG SL tablet Place 0.4 mg under the tongue every 5 (five) minutes x 3 doses as needed for chest pain.    nystatin-triamcinolone ointment (MYCOLOG) Apply 1 application topically 2 (two) times daily.   ondansetron (ZOFRAN-ODT) 4 MG disintegrating tablet Take 4 mg by mouth every 8 (eight) hours as needed for nausea or vomiting.   tiZANidine (ZANAFLEX) 2 MG tablet Take 2 mg by mouth every 8 (eight) hours as needed for muscle spasms.   Vibegron (GEMTESA) 75 MG TABS Take 75 mg by mouth daily.   Vitamin D, Ergocalciferol, (DRISDOL) 50000 units CAPS capsule Take 50,000 Units by mouth every Monday.   Meth-Hyo-M Bl-Na Phos-Ph Sal (URIBEL) 118 MG CAPS Take 1 capsule (118 mg total) by mouth 4 (four) times daily as needed.   [DISCONTINUED] diphenhydrAMINE (BENADRYL) 25 MG tablet Take 25 mg by mouth every 8 (eight) hours as needed.   No facility-administered encounter medications on file as of 07/27/2022.    Review of Systems  Constitutional:  Negative for activity change, appetite change, fatigue and unexpected weight change.  HENT:  Negative for congestion and hearing loss.   Eyes: Negative.   Respiratory:  Negative for cough and shortness of breath.   Cardiovascular:  Negative for  chest pain, palpitations and leg swelling.  Gastrointestinal:  Negative for abdominal pain, constipation and diarrhea.  Genitourinary:  Negative for difficulty urinating and dysuria.  Musculoskeletal:  Positive for arthralgias. Negative for myalgias.  Skin:  Negative for color change and wound.  Neurological:  Negative for dizziness and weakness.  Psychiatric/Behavioral:  Positive for agitation. Negative for behavioral problems and confusion.     Immunization History  Administered Date(s) Administered   Influenza-Unspecified 04/28/2011, 06/14/2012   Moderna Sars-Covid-2 Vaccination 09/08/2019   Tdap 09/03/2013, 11/15/2016   Pertinent  Health Maintenance Due  Topic Date Due   FOOT EXAM  Never done   OPHTHALMOLOGY EXAM  Never done   PAP SMEAR-Modifier  Never done   COLONOSCOPY (Pts 45-30yrs Insurance coverage will need to be confirmed)  Never done   MAMMOGRAM  Never done   HEMOGLOBIN A1C  11/30/2022   INFLUENZA VACCINE  Discontinued      10/09/2021    5:42 AM 11/26/2021    2:10 AM 02/09/2022    1:51 AM 03/01/2022    7:43 AM 05/28/2022    2:13 AM  Fall Risk  Patient Fall Risk Level Moderate fall risk High fall risk Moderate fall risk Moderate fall risk Moderate fall risk   Functional Status Survey:    Vitals:   07/27/22 1542  BP: (!) 150/87  Pulse: 72  Resp: 19  Temp: 97.7 F (36.5 C)  SpO2: 97%  Weight: 167 lb 6.4 oz (75.9 kg)  Height: 5\' 1"  (1.549 m)   Body mass index is 31.63 kg/m. Physical Exam Constitutional:      Appearance: Normal appearance.  Pulmonary:     Effort: Pulmonary effort is normal.  Neurological:     Mental Status: She is alert. Mental status is at baseline.  Psychiatric:        Mood and Affect: Mood normal.     Labs reviewed: Recent Labs    10/09/21 0530 03/01/22 1251 04/13/22 0000 05/28/22 0220 06/01/22 0000  NA 132* 130* 134* 135 135*  K 3.9 3.5 4.2 3.2* 4.1  CL 99 95* 99 100 100  CO2 25 26 27* 27 26*  GLUCOSE 118* 119*  --   137*  --   BUN 8 <5* 11 14 7   CREATININE 0.94 0.81 0.8 0.98 0.9  CALCIUM 8.9 8.9 8.8 8.6* 8.8   Recent Labs    10/09/21 0530 03/01/22 1251 04/13/22 0000 06/01/22 0000  AST 15 25 11* 12*  ALT 8 8 7  6*  ALKPHOS 79 73 101 86  BILITOT 0.5 0.6  --   --   PROT 7.2 7.2  --   --   ALBUMIN 3.8 4.2 3.7 3.8   Recent Labs    10/09/21 0530 03/01/22 1251 04/13/22 0000 05/28/22 0220 06/01/22 0000  WBC 9.9 8.7 10.0 10.8* 7.4  NEUTROABS 7.1 7.0  --   --  5,165.00  HGB 7.8* 9.0* 8.8* 10.0* 9.3*  HCT 27.7* 30.2* 29* 33.8* 31*  MCV 63.5* 65.4*  --  68.6*  --   PLT 576* 524* 419* 436* 419*   Lab Results  Component Value Date   TSH 4.24 06/01/2022   Lab Results  Component Value Date   HGBA1C 6.3 06/01/2022   Lab Results  Component Value Date   CHOL 310 (H) 01/26/2020   HDL 59 01/26/2020   LDLCALC 197 (H) 01/26/2020   TRIG 269 (H) 01/26/2020   CHOLHDL 5.3 01/26/2020    Significant Diagnostic Results in last 30 days:  No results found.  Assessment/Plan 2. Suprapubic catheter ([place]) -ongoing, followed by urology who changes monthly  3. History of stroke -left with left sided weakness, and blindness, she will take ASA but refuses bp and lipid control.   4. Essential hypertension -bp elevated, she refuses bp to be taken most of the time. States she will not take blood pressure medication anyways. Aware of risk of another stroke.   5. Mixed hyperlipidemia -elevated LDL on last lab, will not take medication despite being educated on risk.   6. Vitamin D deficiency -continues on supplement.   7. Gastroesophageal reflux disease without esophagitis -stable on lansoprazole.   8. Hypothyroidism (acquired) TSH stable on synthroid  9. Diabetes mellitus type 2 in obese (HCC) -A1c controlled.  10. GAD (generalized anxiety disorder) -ongoing, would benefit from therapy, but very selective on who she wants to  talk to or see. Continues on valium.     Janene Harvey. Biagio Borg [facility] [phone]

## 2022-08-07 ENCOUNTER — Encounter: Payer: Self-pay | Admitting: Student

## 2022-08-07 ENCOUNTER — Non-Acute Institutional Stay (SKILLED_NURSING_FACILITY): Payer: Medicare Other | Admitting: Student

## 2022-08-07 DIAGNOSIS — Z711 Person with feared health complaint in whom no diagnosis is made: Secondary | ICD-10-CM

## 2022-08-07 DIAGNOSIS — Z9359 Other cystostomy status: Secondary | ICD-10-CM | POA: Diagnosis not present

## 2022-08-07 DIAGNOSIS — I10 Essential (primary) hypertension: Secondary | ICD-10-CM | POA: Diagnosis not present

## 2022-08-07 NOTE — Progress Notes (Signed)
Location:  Other [place]  Nursing Home Room Number: [facility] 405A Place of Service:  SNF [phone]) Provider:  Dr. Amada Kingfisher, MD  Patient Care Team: Dewayne Shorter, MD as PCP - General (Family Medicine) Wellington Hampshire, MD as PCP - Cardiology (Cardiology)  Extended Emergency Contact Information Primary Emergency Contact: Antonieta Loveless of [place] Phone: [phone] Mobile Phone: [phone] Relation: Sister Secondary Emergency Contact: Ranae Pila Address: [address] [place], [place] [address] Johnnette Litter of [place] Phone: [phone] Relation: Mother  Code Status:  DNR Goals of care: Advanced Directive information    08/07/2022    9:38 AM  Advanced Directives  Does Patient Have a Medical Advance Directive? Yes  Type of Advance Directive Out of facility DNR (pink MOST or yellow form)  Does patient want to make changes to medical advance directive? No - Patient declined     Chief Complaint  Patient presents with   Acute Visit    Sore Throat    HPI:  Pt is a 62 y.o. female seen today for an acute visit for sore throat. Patient states she no longer has sore throat. Strept throat culture collected on 1/5 still pending. Patient has had increased drainage and tenderness from her suprapubic catheter. Per nursing, patient often puts her hands from mouth to tube and pulls on the tube periodicallly.    Past Medical History:  Diagnosis Date   Acute ST elevation myocardial infarction (STEMI) of inferolateral wall ([place]) 01/25/2020   Blind    Cholecystitis 04/22/2021   History of migraine headaches    Hyperlipemia    Myocardial infarction [facility])    Stroke [facility])    Past Surgical History:  Procedure Laterality Date   ABDOMINAL HYSTERECTOMY     BACK SURGERY     CARDIAC CATHETERIZATION     CORONARY/GRAFT ACUTE MI REVASCULARIZATION N/A 01/25/2020   Procedure: Coronary/Graft Acute MI  Revascularization;  Surgeon: Wellington Hampshire, MD;  Location: [place] [facility];  Service: Cardiovascular;  Laterality: N/A;   FRACTURE SURGERY     IR CATHETER TUBE CHANGE  08/10/2021   LEFT HEART CATH AND CORONARY ANGIOGRAPHY N/A 01/25/2020   Procedure: LEFT HEART CATH AND CORONARY ANGIOGRAPHY;  Surgeon: Wellington Hampshire, MD;  Location: [place] [facility] LAB;  Service: Cardiovascular;  Laterality: N/A;   SKIN GRAFT Right     Allergies  Allergen Reactions   Levaquin [Levofloxacin In D5w] Anaphylaxis and Swelling   Iodinated Contrast Media Itching    Severe itching   Other     Dust mites and opiates (all of them)   Latex Dermatitis    Outpatient Encounter Medications as of 08/07/2022  Medication Sig   aluminum hydroxide (DERMAGRAN) ointment Apply to Supra Pubic site topically every 24 hours as needed   aspirin EC 81 MG tablet Take 81 mg by mouth daily. Swallow whole.   Azelastine HCl 0.15 % SOLN Place 2 sprays into both nostrils 2 (two) times daily.   bisacodyl (DULCOLAX) 10 MG suppository Place 10 mg rectally as needed for moderate constipation.   clonazePAM (KLONOPIN) 0.5 MG tablet Take 1 tablet (0.5 mg total) by mouth at bedtime.   diazepam (VALIUM) 10 MG tablet Take 1 tablet (10 mg total) by mouth 4 (four) times daily.   guaiFENesin (ROBITUSSIN) 100 MG/5ML liquid Take 10 mLs by mouth every 4 (four) hours as needed for cough or to loosen phlegm.  Hydrocortisone Acetate (VAGISIL) 1 % CREA Apply 1 Application topically every 12 (twelve) hours as needed (For itching).   ibuprofen (ADVIL) 800 MG tablet Take 800 mg by mouth 3 (three) times daily.   lansoprazole (PREVACID) 15 MG capsule Take 15 mg by mouth daily at 12 noon.    levothyroxine (SYNTHROID) 25 MCG tablet Take 25 mcg by mouth daily before breakfast.   magnesium citrate SOLN Take 5 mLs by mouth as directed. every 168 hours as needed for CONSTIPATION   magnesium hydroxide (MILK OF MAGNESIA) 400 MG/5ML suspension Take 5 mLs by  mouth every 6 (six) hours as needed for mild constipation.   nitroGLYCERIN (NITROSTAT) 0.4 MG SL tablet Place 0.4 mg under the tongue every 5 (five) minutes x 3 doses as needed for chest pain.    nystatin-triamcinolone ointment (MYCOLOG) Apply 1 application topically 2 (two) times daily.   ondansetron (ZOFRAN-ODT) 4 MG disintegrating tablet Take 4 mg by mouth every 8 (eight) hours as needed for nausea or vomiting.   tiZANidine (ZANAFLEX) 2 MG tablet Take 2 mg by mouth every 8 (eight) hours as needed for muscle spasms.   Vibegron (GEMTESA) 75 MG TABS Take 75 mg by mouth daily.   Vitamin D, Ergocalciferol, (DRISDOL) 50000 units CAPS capsule Take 50,000 Units by mouth every Monday.   [DISCONTINUED] Meth-Hyo-M Bl-Na Phos-Ph Sal (URIBEL) 118 MG CAPS Take 1 capsule (118 mg total) by mouth 4 (four) times daily as needed.   No facility-administered encounter medications on file as of 08/07/2022.    Review of Systems  All other systems reviewed and are negative.   Immunization History  Administered Date(s) Administered   Influenza-Unspecified 04/28/2011, 06/14/2012   Moderna Sars-Covid-2 Vaccination 09/08/2019, 10/06/2019   Tdap 09/03/2013, 11/15/2016   Pertinent  Health Maintenance Due  Topic Date Due   FOOT EXAM  Never done   OPHTHALMOLOGY EXAM  Never done   PAP SMEAR-Modifier  Never done   COLONOSCOPY (Pts 45-21yrs Insurance coverage will need to be confirmed)  Never done   MAMMOGRAM  Never done   HEMOGLOBIN A1C  11/30/2022   INFLUENZA VACCINE  Discontinued      10/09/2021    5:42 AM 11/26/2021    2:10 AM 02/09/2022    1:51 AM 03/01/2022    7:43 AM 05/28/2022    2:13 AM  Fall Risk  Patient Fall Risk Level Moderate fall risk High fall risk Moderate fall risk Moderate fall risk Moderate fall risk   Functional Status Survey:    Vitals:   08/07/22 0928  BP: (!) 141/73  Pulse: 72  Resp: 16  Temp: (!) 97.2 F (36.2 C)  Weight: 168 lb 9.6 oz (76.5 kg)  Height: 5\' 1"  (1.549 m)    Body mass index is 31.86 kg/m. Physical Exam Vitals reviewed.  Cardiovascular:     Rate and Rhythm: Normal rate.     Pulses: Normal pulses.  Pulmonary:     Effort: Pulmonary effort is normal.  Neurological:     General: No focal deficit present.     Mental Status: She is alert.     Labs reviewed: Recent Labs    10/09/21 0530 03/01/22 1251 04/13/22 0000 05/28/22 0220 06/01/22 0000  NA 132* 130* 134* 135 135*  K 3.9 3.5 4.2 3.2* 4.1  CL 99 95* 99 100 100  CO2 25 26 27* 27 26*  GLUCOSE 118* 119*  --  137*  --   BUN 8 <5* 11 14 7   CREATININE 0.94 0.81 0.8  0.98 0.9  CALCIUM 8.9 8.9 8.8 8.6* 8.8   Recent Labs    10/09/21 0530 03/01/22 1251 04/13/22 0000 06/01/22 0000  AST 15 25 11* 12*  ALT 8 8 7  6*  ALKPHOS 79 73 101 86  BILITOT 0.5 0.6  --   --   PROT 7.2 7.2  --   --   ALBUMIN 3.8 4.2 3.7 3.8   Recent Labs    10/09/21 0530 03/01/22 1251 04/13/22 0000 05/28/22 0220 06/01/22 0000  WBC 9.9 8.7 10.0 10.8* 7.4  NEUTROABS 7.1 7.0  --   --  5,165.00  HGB 7.8* 9.0* 8.8* 10.0* 9.3*  HCT 27.7* 30.2* 29* 33.8* 31*  MCV 63.5* 65.4*  --  68.6*  --   PLT 576* 524* 419* 436* 419*   Lab Results  Component Value Date   TSH 4.24 06/01/2022   Lab Results  Component Value Date   HGBA1C 6.3 06/01/2022   Lab Results  Component Value Date   CHOL 310 (H) 01/26/2020   HDL 59 01/26/2020   LDLCALC 197 (H) 01/26/2020   TRIG 269 (H) 01/26/2020   CHOLHDL 5.3 01/26/2020    Significant Diagnostic Results in last 30 days:  No results found.  Assessment/Plan Worried well  Suprapubic catheter (HCC)  Essential hypertension Patient no longer with sore throat and rapid resolution of symptoms without interventions. Suprapubic catheter to be changed tomorrow with urology. Discourage touching by patient. BP slightly above goal at this time. Will continue to monitor, if persistently >150/90 will initiate therapy.    Family/ staff Communication: nursing  Labs/tests  ordered:  none

## 2022-08-07 NOTE — Progress Notes (Unsigned)
Suprapubic Cath Change  Patient is present today for a suprapubic catheter change due to urinary retention.  8ml of water was drained from the balloon, a 18FR foley cath was removed from the tract with out difficulty.  Site was cleaned and prepped in a sterile fashion with betadine.  A 18FR foley cath was replaced into the tract no complications were noted. Urine return was noted, 10 ml of sterile water was inflated into the balloon and a Night bag was attached for drainage.  Patient tolerated well. A night bag was given to patient and proper instruction was given on how to switch bags.    Performed by: Carman Ching, PA-C, Ples Specter, CMA, and Teressa Lower, CMA  Follow up: One month

## 2022-08-08 ENCOUNTER — Ambulatory Visit (INDEPENDENT_AMBULATORY_CARE_PROVIDER_SITE_OTHER): Payer: Medicare Other | Admitting: Physician Assistant

## 2022-08-08 DIAGNOSIS — Z9359 Other cystostomy status: Secondary | ICD-10-CM | POA: Diagnosis not present

## 2022-08-08 DIAGNOSIS — R339 Retention of urine, unspecified: Secondary | ICD-10-CM | POA: Diagnosis not present

## 2022-08-09 ENCOUNTER — Other Ambulatory Visit: Payer: Self-pay | Admitting: Student

## 2022-08-09 DIAGNOSIS — J020 Streptococcal pharyngitis: Secondary | ICD-10-CM

## 2022-08-09 MED ORDER — AMOXICILLIN-POT CLAVULANATE 875-125 MG PO TABS: 1.0000 | ORAL_TABLET | Freq: Two times a day (BID) | ORAL | 0 refills | Status: DC

## 2022-08-09 NOTE — Progress Notes (Signed)
Throat Culture positive for GAS. Plan to start augmentin 875-125 BID for 10 days.

## 2022-08-29 ENCOUNTER — Encounter: Payer: Self-pay | Admitting: Nurse Practitioner

## 2022-08-29 ENCOUNTER — Non-Acute Institutional Stay (SKILLED_NURSING_FACILITY): Payer: Medicare Other | Admitting: Nurse Practitioner

## 2022-08-29 DIAGNOSIS — Z9359 Other cystostomy status: Secondary | ICD-10-CM

## 2022-08-29 DIAGNOSIS — R102 Pelvic and perineal pain: Secondary | ICD-10-CM

## 2022-08-29 DIAGNOSIS — K5901 Slow transit constipation: Secondary | ICD-10-CM

## 2022-08-29 NOTE — Progress Notes (Unsigned)
Location:   [facility] Room Number: [phone] Place of Service:  SNF [phone]) Provider:  Sherrie Mustache, NP  Dewayne Shorter, MD  Patient Care Team: Dewayne Shorter, MD as PCP - General (Family Medicine) Wellington Hampshire, MD as PCP - Cardiology (Cardiology)  Extended Emergency Contact Information Primary Emergency Contact: Antonieta Loveless of [place] Phone: [phone] Mobile Phone: [phone] Relation: Sister Secondary Emergency Contact: Ranae Pila Address: [address] [place], [place] [address] Johnnette Litter of [place] Phone: [phone] Relation: Mother  Code Status:  DNR Goals of care: Advanced Directive information    08/07/2022    9:38 AM  Advanced Directives  Does Patient Have a Medical Advance Directive? Yes  Type of Advance Directive Out of facility DNR (pink MOST or yellow form)  Does patient want to make changes to medical advance directive? No - Patient declined     Chief Complaint  Patient presents with   Acute Visit    Bladder pain    HPI:  Pt is a 62 y.o. female seen today for an acute visit for bladder pain. Pt with hx of suprapubic catheter going to urology monthly to have replaced. Pt reports she has had suprapubic/bladder pain since her catheter was changed last month. Nursing states she chronically has suprapubic/bladder pain. No acute pain. No nausea, vomiting, diarrhea. Reports she is constipated. Urine in bag flowing freely and clear.  No fever or chills.    Past Medical History:  Diagnosis Date   Acute ST elevation myocardial infarction (STEMI) of inferolateral wall ([place]) 01/25/2020   Blind    Cholecystitis 04/22/2021   History of migraine headaches    Hyperlipemia    Myocardial infarction [facility])    Stroke [facility])    Past Surgical History:  Procedure Laterality Date   ABDOMINAL HYSTERECTOMY     BACK SURGERY     CARDIAC CATHETERIZATION     CORONARY/GRAFT  ACUTE MI REVASCULARIZATION N/A 01/25/2020   Procedure: Coronary/Graft Acute MI Revascularization;  Surgeon: Wellington Hampshire, MD;  Location: [place] [facility] LAB;  Service: Cardiovascular;  Laterality: N/A;   FRACTURE SURGERY     IR CATHETER TUBE CHANGE  08/10/2021   LEFT HEART CATH AND CORONARY ANGIOGRAPHY N/A 01/25/2020   Procedure: LEFT HEART CATH AND CORONARY ANGIOGRAPHY;  Surgeon: Wellington Hampshire, MD;  Location: [place] [facility] LAB;  Service: Cardiovascular;  Laterality: N/A;   SKIN GRAFT Right     Allergies  Allergen Reactions   Levaquin [Levofloxacin In D5w] Anaphylaxis and Swelling   Iodinated Contrast Media Itching    Severe itching   Other     Dust mites and opiates (all of them)   Latex Dermatitis    Allergies as of 08/29/2022       Reactions   Levaquin [levofloxacin In D5w] Anaphylaxis, Swelling   Iodinated Contrast Media Itching   Severe itching   Other    Dust mites and opiates (all of them)   Latex Dermatitis        Medication List        Accurate as of August 29, 2022  2:06 PM. If you have any questions, ask your nurse or doctor.          STOP taking these medications    amoxicillin-clavulanate 875-125 MG tablet Commonly known as: AUGMENTIN Stopped by: Lauree Chandler, NP   magnesium citrate Soln Stopped by: Lauree Chandler, NP  TAKE these medications    aluminum hydroxide ointment Commonly known as: DERMAGRAN Apply to Supra Pubic site topically every 24 hours as needed   aspirin EC 81 MG tablet Take 81 mg by mouth daily. Swallow whole.   Azelastine HCl 0.15 % Soln Place 2 sprays into both nostrils 2 (two) times daily.   bisacodyl 10 MG suppository Commonly known as: DULCOLAX Place 10 mg rectally as needed for moderate constipation.   clonazePAM 0.5 MG tablet Commonly known as: KLONOPIN Take 1 tablet (0.5 mg total) by mouth at bedtime.   diazepam 10 MG tablet Commonly known as: VALIUM Take 1 tablet (10 mg total) by  mouth 4 (four) times daily.   Gemtesa 75 MG Tabs Generic drug: Vibegron Take 75 mg by mouth daily.   guaiFENesin 100 MG/5ML liquid Commonly known as: ROBITUSSIN Take 10 mLs by mouth every 4 (four) hours as needed for cough or to loosen phlegm.   ibuprofen 800 MG tablet Commonly known as: ADVIL Take 800 mg by mouth every 8 (eight) hours as needed (Bladder pain/discomfort).   lansoprazole 15 MG capsule Commonly known as: PREVACID Take 15 mg by mouth daily at 12 noon.   levothyroxine 25 MCG tablet Commonly known as: SYNTHROID Take 25 mcg by mouth daily before breakfast.   magnesium hydroxide 400 MG/5ML suspension Commonly known as: MILK OF MAGNESIA Take 5 mLs by mouth every 6 (six) hours as needed for mild constipation.   menthol-cetylpyridinium 3 MG lozenge Commonly known as: CEPACOL Take 1 lozenge by mouth every 6 (six) hours as needed for sore throat.   nitroGLYCERIN 0.4 MG SL tablet Commonly known as: NITROSTAT Place 0.4 mg under the tongue every 5 (five) minutes x 3 doses as needed for chest pain.   nystatin-triamcinolone ointment Commonly known as: MYCOLOG Apply 1 application topically 2 (two) times daily.   ondansetron 4 MG disintegrating tablet Commonly known as: ZOFRAN-ODT Take 4 mg by mouth every 8 (eight) hours as needed for nausea or vomiting.   tiZANidine 2 MG tablet Commonly known as: ZANAFLEX Take 2 mg by mouth every 8 (eight) hours as needed for muscle spasms.   Vagisil 1 % Crea Generic drug: Hydrocortisone Acetate Apply 1 Application topically every 12 (twelve) hours as needed (For itching).   Vitamin D (Ergocalciferol) 1.25 MG (50000 UNIT) Caps capsule Commonly known as: DRISDOL Take 50,000 Units by mouth every Monday.        Review of Systems  Constitutional:  Negative for activity change, appetite change, fatigue and unexpected weight change.  HENT:  Negative for congestion and hearing loss.   Eyes: Negative.   Respiratory:  Negative for  cough and shortness of breath.   Cardiovascular:  Negative for chest pain, palpitations and leg swelling.  Gastrointestinal:  Positive for constipation. Negative for abdominal pain and diarrhea.  Genitourinary:        Suprapubic discomfort  Skin:  Negative for color change and wound.  Neurological:  Negative for dizziness and weakness.    Immunization History  Administered Date(s) Administered   Influenza-Unspecified 04/28/2011, 06/14/2012   Moderna Sars-Covid-2 Vaccination 09/08/2019, 10/06/2019   Tdap 09/03/2013, 11/15/2016   Pertinent  Health Maintenance Due  Topic Date Due   FOOT EXAM  Never done   OPHTHALMOLOGY EXAM  Never done   PAP SMEAR-Modifier  Never done   COLONOSCOPY (Pts 45-42yrs Insurance coverage will need to be confirmed)  Never done   MAMMOGRAM  Never done   HEMOGLOBIN A1C  11/30/2022   INFLUENZA VACCINE  Discontinued      10/09/2021    5:42 AM 11/26/2021    2:10 AM 02/09/2022    1:51 AM 03/01/2022    7:43 AM 05/28/2022    2:13 AM  Fall Risk  (RETIRED) Patient Fall Risk Level Moderate fall risk High fall risk Moderate fall risk Moderate fall risk Moderate fall risk   Functional Status Survey:    Vitals:   08/29/22 1337  BP: (!) 150/87  Pulse: 72  Resp: 19  Temp: (!) 97.4 F (36.3 C)  SpO2: 97%  Weight: 168 lb 9.6 oz (76.5 kg)  Height: 5\' 1"  (1.549 m)   Body mass index is 31.86 kg/m. Physical Exam Constitutional:      General: She is not in acute distress.    Appearance: She is well-developed. She is not diaphoretic.  HENT:     Head: Normocephalic and atraumatic.     Mouth/Throat:     Pharynx: No oropharyngeal exudate.  Eyes:     Conjunctiva/sclera: Conjunctivae normal.     Pupils: Pupils are equal, round, and reactive to light.  Cardiovascular:     Rate and Rhythm: Normal rate and regular rhythm.     Heart sounds: Normal heart sounds.  Pulmonary:     Effort: Pulmonary effort is normal.     Breath sounds: Normal breath sounds.  Abdominal:      General: Bowel sounds are normal.     Palpations: Abdomen is soft.     Comments: Suprapubic noted- site without increase in erythema or drainage. Urine clear   Musculoskeletal:     Cervical back: Normal range of motion and neck supple.     Right lower leg: No edema.     Left lower leg: No edema.  Skin:    General: Skin is warm and dry.  Neurological:     Mental Status: She is alert. Mental status is at baseline.  Psychiatric:        Mood and Affect: Mood normal.     Labs reviewed: Recent Labs    10/09/21 0530 03/01/22 1251 04/13/22 0000 05/28/22 0220 06/01/22 0000  NA 132* 130* 134* 135 135*  K 3.9 3.5 4.2 3.2* 4.1  CL 99 95* 99 100 100  CO2 25 26 27* 27 26*  GLUCOSE 118* 119*  --  137*  --   BUN 8 <5* 11 14 7   CREATININE 0.94 0.81 0.8 0.98 0.9  CALCIUM 8.9 8.9 8.8 8.6* 8.8   Recent Labs    10/09/21 0530 03/01/22 1251 04/13/22 0000 06/01/22 0000  AST 15 25 11* 12*  ALT 8 8 7  6*  ALKPHOS 79 73 101 86  BILITOT 0.5 0.6  --   --   PROT 7.2 7.2  --   --   ALBUMIN 3.8 4.2 3.7 3.8   Recent Labs    10/09/21 0530 03/01/22 1251 04/13/22 0000 05/28/22 0220 06/01/22 0000  WBC 9.9 8.7 10.0 10.8* 7.4  NEUTROABS 7.1 7.0  --   --  5,165.00  HGB 7.8* 9.0* 8.8* 10.0* 9.3*  HCT 27.7* 30.2* 29* 33.8* 31*  MCV 63.5* 65.4*  --  68.6*  --   PLT 576* 524* 419* 436* 419*   Lab Results  Component Value Date   TSH 4.24 06/01/2022   Lab Results  Component Value Date   HGBA1C 6.3 06/01/2022   Lab Results  Component Value Date   CHOL 310 (H) 01/26/2020   HDL 59 01/26/2020   LDLCALC 197 (H) 01/26/2020  TRIG 269 (H) 01/26/2020   CHOLHDL 5.3 01/26/2020    Significant Diagnostic Results in last 30 days:  No results found.  Assessment/Plan 1. Suprapubic catheter ([place]) In place, draining well. Followed by urology monthly  2. Suprapubic discomfort Chronic without worsening of pain- plans to discuss ongoing concern with urology at her next appt. She gets catheter  changed monthly. No signs of infection noted. To notify as needed-  3. Constipation by delayed colonic transit -will add miralax 17 gm daily   [facility] Ige K. [place], [facility] [phone]

## 2022-08-31 LAB — COMPREHENSIVE METABOLIC PANEL
Calcium: 8.6 — AB (ref 8.7–10.7)
eGFR: 69

## 2022-08-31 LAB — BASIC METABOLIC PANEL
BUN: 4 (ref 4–21)
CO2: 24 — AB (ref 13–22)
Chloride: 95 — AB (ref 99–108)
Creatinine: 0.9 (ref 0.5–1.1)
Glucose: 61
Potassium: 4.8 mEq/L (ref 3.5–5.1)
Sodium: 130 — AB (ref 137–147)

## 2022-08-31 LAB — CBC AND DIFFERENTIAL
HCT: 37 (ref 36–46)
Hemoglobin: 11.2 — AB (ref 12.0–16.0)
Neutrophils Absolute: 5390
Platelets: 345 10*3/uL (ref 150–400)
WBC: 7

## 2022-08-31 LAB — CBC: RBC: 5.29 — AB (ref 3.87–5.11)

## 2022-09-04 NOTE — Progress Notes (Unsigned)
Suprapubic Cath Change  Patient is present today for a suprapubic catheter change due to urinary retention.  8 ml of water was drained from the balloon, a 18 FR Silastic foley cath was removed from the tract with out difficulty.  Site was cleaned and prepped in a sterile fashion with betadine.  A 18 FR Silastic foley cath was replaced into the tract no complications were noted. Urine return was noted, 10 ml of sterile water was inflated into the balloon and a night bag was attached for drainage.  Patient tolerated well.   Performed by: Michiel Cowboy, PA-C and Honor Loh, CMA  Follow up: One month for SPT exchange    Facility is requesting urinalysis and urine culture as she is complaining of bladder pain.  She states that when the suprapubic site is manipulated or palpated is very painful.  I was able to exchange the suprapubic tube today without any complaint of pain from her.    CATH UA pending  Urine culture pending.

## 2022-09-05 ENCOUNTER — Ambulatory Visit (INDEPENDENT_AMBULATORY_CARE_PROVIDER_SITE_OTHER): Payer: Medicare Other | Admitting: Urology

## 2022-09-05 DIAGNOSIS — R339 Retention of urine, unspecified: Secondary | ICD-10-CM

## 2022-09-05 DIAGNOSIS — R3989 Other symptoms and signs involving the genitourinary system: Secondary | ICD-10-CM

## 2022-09-05 DIAGNOSIS — Z9359 Other cystostomy status: Secondary | ICD-10-CM

## 2022-09-06 LAB — URINALYSIS, COMPLETE
Bilirubin, UA: NEGATIVE
Glucose, UA: NEGATIVE
Ketones, UA: NEGATIVE
Nitrite, UA: NEGATIVE
Specific Gravity, UA: <1.005 — ABNORMAL LOW (ref 1.005–1.030)
Urobilinogen, Ur: 0.2 mg/dL (ref 0.2–1.0)
pH, UA: 6 (ref 5.0–7.5)

## 2022-09-06 LAB — MICROSCOPIC EXAMINATION: WBC, UA: >30 /hpf — AB (ref 0–5)

## 2022-09-08 ENCOUNTER — Other Ambulatory Visit: Payer: Self-pay | Admitting: Student

## 2022-09-08 MED ORDER — DIAZEPAM 10 MG PO TABS: 10.0000 mg | ORAL_TABLET | Freq: Four times a day (QID) | ORAL | 0 refills | Status: DC

## 2022-09-08 NOTE — Progress Notes (Signed)
Refill of chronic valium rx.

## 2022-09-10 LAB — CULTURE, URINE COMPREHENSIVE

## 2022-09-11 ENCOUNTER — Non-Acute Institutional Stay (SKILLED_NURSING_FACILITY): Payer: Medicare Other | Admitting: Student

## 2022-09-11 DIAGNOSIS — F29 Unspecified psychosis not due to a substance or known physiological condition: Secondary | ICD-10-CM

## 2022-09-11 DIAGNOSIS — R410 Disorientation, unspecified: Secondary | ICD-10-CM
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
## 2022-09-12 ENCOUNTER — Encounter: Payer: Self-pay | Admitting: Student

## 2022-09-13 ENCOUNTER — Inpatient Hospital Stay
Admission: EM | Admit: 2022-09-13 | Discharge: 2022-09-16 | DRG: 699 | Disposition: A | Discharge status: Skilled Nursing Facility | Payer: Medicare Other | Source: Skilled Nursing Facility | Attending: Family Medicine | Admitting: Family Medicine

## 2022-09-13 ENCOUNTER — Encounter: Payer: Self-pay | Admitting: Internal Medicine

## 2022-09-13 ENCOUNTER — Emergency Department: Payer: Medicare Other

## 2022-09-13 DIAGNOSIS — E785 Hyperlipidemia, unspecified: Secondary | ICD-10-CM | POA: Diagnosis present

## 2022-09-13 DIAGNOSIS — I25118 Atherosclerotic heart disease of native coronary artery with other forms of angina pectoris: Secondary | ICD-10-CM | POA: Diagnosis present

## 2022-09-13 DIAGNOSIS — R059 Cough, unspecified: Secondary | ICD-10-CM | POA: Diagnosis present

## 2022-09-13 DIAGNOSIS — I69334 Monoplegia of upper limb following cerebral infarction affecting left non-dominant side: Secondary | ICD-10-CM | POA: Diagnosis not present

## 2022-09-13 DIAGNOSIS — Z6831 Body mass index (BMI) 31.0-31.9, adult: Secondary | ICD-10-CM

## 2022-09-13 DIAGNOSIS — E119 Type 2 diabetes mellitus without complications: Secondary | ICD-10-CM | POA: Diagnosis present

## 2022-09-13 DIAGNOSIS — N390 Urinary tract infection, site not specified: Secondary | ICD-10-CM | POA: Diagnosis not present

## 2022-09-13 DIAGNOSIS — B9561 Methicillin susceptible Staphylococcus aureus infection as the cause of diseases classified elsewhere: Secondary | ICD-10-CM | POA: Diagnosis present

## 2022-09-13 DIAGNOSIS — Y846 Urinary catheterization as the cause of abnormal reaction of the patient, or of later complication, without mention of misadventure at the time of the procedure: Secondary | ICD-10-CM | POA: Diagnosis present

## 2022-09-13 DIAGNOSIS — Z91040 Latex allergy status: Secondary | ICD-10-CM

## 2022-09-13 DIAGNOSIS — Z823 Family history of stroke: Secondary | ICD-10-CM

## 2022-09-13 DIAGNOSIS — H547 Unspecified visual loss: Secondary | ICD-10-CM | POA: Diagnosis present

## 2022-09-13 DIAGNOSIS — E039 Hypothyroidism, unspecified: Secondary | ICD-10-CM | POA: Diagnosis present

## 2022-09-13 DIAGNOSIS — R410 Disorientation, unspecified: Principal | ICD-10-CM

## 2022-09-13 DIAGNOSIS — D509 Iron deficiency anemia, unspecified: Secondary | ICD-10-CM | POA: Diagnosis present

## 2022-09-13 DIAGNOSIS — Z833 Family history of diabetes mellitus: Secondary | ICD-10-CM

## 2022-09-13 DIAGNOSIS — Z83438 Family history of other disorder of lipoprotein metabolism and other lipidemia: Secondary | ICD-10-CM

## 2022-09-13 DIAGNOSIS — Z79890 Hormone replacement therapy (postmenopausal): Secondary | ICD-10-CM | POA: Diagnosis not present

## 2022-09-13 DIAGNOSIS — F32A Depression, unspecified: Secondary | ICD-10-CM | POA: Diagnosis present

## 2022-09-13 DIAGNOSIS — F331 Major depressive disorder, recurrent, moderate: Secondary | ICD-10-CM | POA: Diagnosis present

## 2022-09-13 DIAGNOSIS — T83518A Infection and inflammatory reaction due to other urinary catheter, initial encounter: Principal | ICD-10-CM | POA: Diagnosis present

## 2022-09-13 DIAGNOSIS — I639 Cerebral infarction, unspecified: Secondary | ICD-10-CM | POA: Diagnosis present

## 2022-09-13 DIAGNOSIS — Z9359 Other cystostomy status: Secondary | ICD-10-CM | POA: Diagnosis not present

## 2022-09-13 DIAGNOSIS — E669 Obesity, unspecified: Secondary | ICD-10-CM | POA: Diagnosis present

## 2022-09-13 DIAGNOSIS — F05 Delirium due to known physiological condition: Secondary | ICD-10-CM | POA: Diagnosis present

## 2022-09-13 DIAGNOSIS — Z1152 Encounter for screening for COVID-19: Secondary | ICD-10-CM

## 2022-09-13 DIAGNOSIS — I2510 Atherosclerotic heart disease of native coronary artery without angina pectoris: Secondary | ICD-10-CM | POA: Diagnosis present

## 2022-09-13 DIAGNOSIS — B9562 Methicillin resistant Staphylococcus aureus infection as the cause of diseases classified elsewhere: Secondary | ICD-10-CM | POA: Diagnosis present

## 2022-09-13 DIAGNOSIS — B965 Pseudomonas (aeruginosa) (mallei) (pseudomallei) as the cause of diseases classified elsewhere: Secondary | ICD-10-CM | POA: Diagnosis present

## 2022-09-13 DIAGNOSIS — F29 Unspecified psychosis not due to a substance or known physiological condition: Secondary | ICD-10-CM | POA: Diagnosis present

## 2022-09-13 DIAGNOSIS — I25110 Atherosclerotic heart disease of native coronary artery with unstable angina pectoris: Secondary | ICD-10-CM | POA: Diagnosis present

## 2022-09-13 DIAGNOSIS — H47619 Cortical blindness, unspecified side of brain: Secondary | ICD-10-CM | POA: Diagnosis present

## 2022-09-13 DIAGNOSIS — Z79899 Other long term (current) drug therapy: Secondary | ICD-10-CM

## 2022-09-13 DIAGNOSIS — E876 Hypokalemia: Secondary | ICD-10-CM | POA: Diagnosis present

## 2022-09-13 DIAGNOSIS — I252 Old myocardial infarction: Secondary | ICD-10-CM | POA: Diagnosis not present

## 2022-09-13 DIAGNOSIS — Z90710 Acquired absence of both cervix and uterus: Secondary | ICD-10-CM

## 2022-09-13 DIAGNOSIS — F419 Anxiety disorder, unspecified: Secondary | ICD-10-CM | POA: Diagnosis present

## 2022-09-13 DIAGNOSIS — Z8249 Family history of ischemic heart disease and other diseases of the circulatory system: Secondary | ICD-10-CM

## 2022-09-13 DIAGNOSIS — Z881 Allergy status to other antibiotic agents status: Secondary | ICD-10-CM

## 2022-09-13 DIAGNOSIS — Z7982 Long term (current) use of aspirin: Secondary | ICD-10-CM

## 2022-09-13 DIAGNOSIS — Z66 Do not resuscitate: Secondary | ICD-10-CM | POA: Diagnosis present

## 2022-09-13 DIAGNOSIS — Z8673 Personal history of transient ischemic attack (TIA), and cerebral infarction without residual deficits: Secondary | ICD-10-CM

## 2022-09-13 DIAGNOSIS — Z91041 Radiographic dye allergy status: Secondary | ICD-10-CM

## 2022-09-13 LAB — URINALYSIS, ROUTINE W REFLEX MICROSCOPIC
Bilirubin Urine: NEGATIVE
Glucose, UA: NEGATIVE mg/dL
Ketones, ur: 20 mg/dL — AB
Nitrite: POSITIVE — AB
Protein, ur: 30 mg/dL — AB
Specific Gravity, Urine: 1.018 (ref 1.005–1.030)
Squamous Epithelial / HPF: NONE SEEN /HPF (ref 0–5)
WBC, UA: >50 WBC/hpf (ref 0–5)
pH: 5 (ref 5.0–8.0)

## 2022-09-13 LAB — COMPREHENSIVE METABOLIC PANEL
ALT: 19 U/L (ref 0–44)
AST: 50 U/L — ABNORMAL HIGH (ref 15–41)
Albumin: 4.1 g/dL (ref 3.5–5.0)
Alkaline Phosphatase: 86 U/L (ref 38–126)
Anion gap: 11 (ref 5–15)
BUN: 16 mg/dL (ref 8–23)
CO2: 21 mmol/L — ABNORMAL LOW (ref 22–32)
Calcium: 9.1 mg/dL (ref 8.9–10.3)
Chloride: 105 mmol/L (ref 98–111)
Creatinine, Ser: 1.07 mg/dL — ABNORMAL HIGH (ref 0.44–1.00)
GFR, Estimated: 59 mL/min — ABNORMAL LOW (ref >60)
Glucose, Bld: 136 mg/dL — ABNORMAL HIGH (ref 70–99)
Potassium: 3.4 mmol/L — ABNORMAL LOW (ref 3.5–5.1)
Sodium: 137 mmol/L (ref 135–145)
Total Bilirubin: 1.3 mg/dL — ABNORMAL HIGH (ref 0.3–1.2)
Total Protein: 7.8 g/dL (ref 6.5–8.1)

## 2022-09-13 LAB — CBG MONITORING, ED: Glucose-Capillary: 117 mg/dL — ABNORMAL HIGH (ref 70–99)

## 2022-09-13 LAB — CBC
HCT: 34.9 % — ABNORMAL LOW (ref 36.0–46.0)
Hemoglobin: 10.5 g/dL — ABNORMAL LOW (ref 12.0–15.0)
MCH: 20.8 pg — ABNORMAL LOW (ref 26.0–34.0)
MCHC: 30.1 g/dL (ref 30.0–36.0)
MCV: 69 fL — ABNORMAL LOW (ref 80.0–100.0)
Platelets: 428 10*3/uL — ABNORMAL HIGH (ref 150–400)
RBC: 5.06 MIL/uL (ref 3.87–5.11)
RDW: 17.2 % — ABNORMAL HIGH (ref 11.5–15.5)
WBC: 13.2 10*3/uL — ABNORMAL HIGH (ref 4.0–10.5)
nRBC: 0 % (ref 0.0–0.2)

## 2022-09-13 LAB — AMMONIA: Ammonia: 24 umol/L (ref 9–35)

## 2022-09-13 LAB — RESP PANEL BY RT-PCR (FLU A&B, COVID) ARPGX2
Influenza A by PCR: NEGATIVE
Influenza B by PCR: NEGATIVE
SARS Coronavirus 2 by RT PCR: NEGATIVE

## 2022-09-13 LAB — TSH: TSH: 1.988 u[IU]/mL (ref 0.350–4.500)

## 2022-09-13 MED ORDER — DOCUSATE SODIUM 100 MG PO CAPS
100.0000 mg | ORAL_CAPSULE | Freq: Two times a day (BID) | ORAL | Status: DC
  Administered 2022-09-13 – 2022-09-16 (×7): 100 mg via ORAL
  Filled 2022-09-13 (×7): qty 1

## 2022-09-13 MED ORDER — ONDANSETRON HCL 4 MG PO TABS: 4.0000 mg | ORAL_TABLET | Freq: Four times a day (QID) | ORAL | Status: DC | PRN

## 2022-09-13 MED ORDER — SODIUM CHLORIDE 0.9% FLUSH
3.0000 mL | Freq: Two times a day (BID) | INTRAVENOUS | Status: DC
  Administered 2022-09-13 – 2022-09-16 (×3): 3 mL via INTRAVENOUS

## 2022-09-13 MED ORDER — TIZANIDINE HCL 4 MG PO TABS: 2.0000 mg | ORAL_TABLET | Freq: Three times a day (TID) | ORAL | Status: DC | PRN

## 2022-09-13 MED ORDER — OXYCODONE HCL 5 MG PO TABS: 5.0000 mg | ORAL_TABLET | ORAL | Status: DC | PRN

## 2022-09-13 MED ORDER — GUAIFENESIN 100 MG/5ML PO LIQD: 10.0000 mL | ORAL | Status: DC | PRN

## 2022-09-13 MED ORDER — ONDANSETRON HCL 4 MG/2ML IJ SOLN: 4.0000 mg | Freq: Four times a day (QID) | INTRAMUSCULAR | Status: DC | PRN

## 2022-09-13 MED ORDER — ASPIRIN 81 MG PO TBEC
81.0000 mg | DELAYED_RELEASE_TABLET | Freq: Every day | ORAL | Status: DC
  Administered 2022-09-14 – 2022-09-16 (×3): 81 mg via ORAL
  Filled 2022-09-13 (×3): qty 1

## 2022-09-13 MED ORDER — LEVOTHYROXINE SODIUM 25 MCG PO TABS
25.0000 ug | ORAL_TABLET | Freq: Every day | ORAL | Status: DC
  Administered 2022-09-14 – 2022-09-16 (×3): 25 ug via ORAL
  Filled 2022-09-13 (×3): qty 1

## 2022-09-13 MED ORDER — SODIUM CHLORIDE 0.9 % IV SOLN
2.0000 g | Freq: Once | INTRAVENOUS | Status: AC
  Administered 2022-09-13: 2 g via INTRAVENOUS
  Filled 2022-09-13: qty 20

## 2022-09-13 MED ORDER — LACTATED RINGERS IV SOLN: INTRAVENOUS | Status: DC

## 2022-09-13 MED ORDER — ORAL CARE MOUTH RINSE
15.0000 mL | OROMUCOSAL | Status: DC
  Administered 2022-09-14 – 2022-09-16 (×8): 15 mL via OROMUCOSAL
  Filled 2022-09-13 (×5): qty 15

## 2022-09-13 MED ORDER — POLYETHYLENE GLYCOL 3350 17 G PO PACK
17.0000 g | PACK | Freq: Every day | ORAL | Status: DC
  Administered 2022-09-13 – 2022-09-14 (×2): 17 g via ORAL
  Filled 2022-09-13 (×3): qty 1

## 2022-09-13 MED ORDER — ACETAMINOPHEN 650 MG RE SUPP: 650.0000 mg | Freq: Four times a day (QID) | RECTAL | Status: DC | PRN

## 2022-09-13 MED ORDER — HYDRALAZINE HCL 20 MG/ML IJ SOLN: 5.0000 mg | INTRAMUSCULAR | Status: DC | PRN

## 2022-09-13 MED ORDER — BISACODYL 10 MG RE SUPP
10.0000 mg | Freq: Every day | RECTAL | Status: DC
  Filled 2022-09-13: qty 1

## 2022-09-13 MED ORDER — ENOXAPARIN SODIUM 40 MG/0.4ML IJ SOSY
0.5000 mg/kg | PREFILLED_SYRINGE | INTRAMUSCULAR | Status: DC
  Administered 2022-09-13 – 2022-09-15 (×2): 37.5 mg via SUBCUTANEOUS
  Filled 2022-09-13 (×3): qty 0.4

## 2022-09-13 MED ORDER — CLONAZEPAM 0.5 MG PO TABS
0.5000 mg | ORAL_TABLET | Freq: Every day | ORAL | Status: DC
  Administered 2022-09-13 – 2022-09-15 (×3): 0.5 mg via ORAL
  Filled 2022-09-13 (×3): qty 1

## 2022-09-13 MED ORDER — OLANZAPINE 5 MG PO TABS
2.5000 mg | ORAL_TABLET | Freq: Two times a day (BID) | ORAL | Status: DC
  Administered 2022-09-13 – 2022-09-16 (×7): 2.5 mg via ORAL
  Filled 2022-09-13 (×7): qty 1

## 2022-09-13 MED ORDER — ORAL CARE MOUTH RINSE
15.0000 mL | OROMUCOSAL | Status: DC | PRN
  Administered 2022-09-15: 15 mL via OROMUCOSAL

## 2022-09-13 MED ORDER — AZELASTINE HCL 0.1 % NA SOLN: 2.0000 | Freq: Two times a day (BID) | NASAL | Status: DC | PRN

## 2022-09-13 MED ORDER — MIRABEGRON ER 25 MG PO TB24
25.0000 mg | ORAL_TABLET | Freq: Every day | ORAL | Status: DC
  Administered 2022-09-13 – 2022-09-16 (×4): 25 mg via ORAL
  Filled 2022-09-13 (×5): qty 1

## 2022-09-13 MED ORDER — MAGNESIUM CITRATE PO SOLN: 1.0000 | ORAL | Status: DC | PRN

## 2022-09-13 MED ORDER — DIAZEPAM 5 MG PO TABS
10.0000 mg | ORAL_TABLET | Freq: Four times a day (QID) | ORAL | Status: DC
  Administered 2022-09-13 – 2022-09-16 (×10): 10 mg via ORAL
  Filled 2022-09-13 (×12): qty 2

## 2022-09-13 MED ORDER — LORAZEPAM 2 MG/ML IJ SOLN
1.0000 mg | Freq: Once | INTRAMUSCULAR | Status: AC
  Administered 2022-09-13: 1 mg via INTRAVENOUS
  Filled 2022-09-13: qty 1

## 2022-09-13 MED ORDER — PANTOPRAZOLE SODIUM 40 MG PO TBEC
40.0000 mg | DELAYED_RELEASE_TABLET | Freq: Every day | ORAL | Status: DC
  Administered 2022-09-13 – 2022-09-16 (×4): 40 mg via ORAL
  Filled 2022-09-13 (×4): qty 1

## 2022-09-13 MED ORDER — SODIUM CHLORIDE 0.9 % IV SOLN
2.0000 g | Freq: Two times a day (BID) | INTRAVENOUS | Status: DC
  Administered 2022-09-13 – 2022-09-14 (×2): 2 g via INTRAVENOUS
  Filled 2022-09-13 (×2): qty 12.5

## 2022-09-13 MED ORDER — ACETAMINOPHEN 325 MG PO TABS
650.0000 mg | ORAL_TABLET | Freq: Four times a day (QID) | ORAL | Status: DC | PRN
  Filled 2022-09-13: qty 2

## 2022-09-13 NOTE — ED Notes (Signed)
X-ray at bedside

## 2022-09-13 NOTE — Consult Note (Signed)
Pharmacy Antibiotic Note  Victoria Medina is a 62 y.o. female admitted on 09/13/2022 with UTI. Patient has history of Pseudomonas UTI in 04/2022.  Pharmacy has been consulted for cefepime dosing.  Plan: -Start Cefepime 2 grams IV every 12 hours -Follow renal function for adjustments  Height: 5' 1"$  (154.9 cm) Weight: 76.5 kg (168 lb 10.4 oz) IBW/kg (Calculated) : 47.8  Temp (24hrs), Avg:99.2 F (37.3 C), Min:99.2 F (37.3 C), Max:99.2 F (37.3 C)  Recent Labs  Lab 09/13/22 1016  WBC 13.2*  CREATININE 1.07*    Estimated Creatinine Clearance: 51.7 mL/min (A) (by C-G formula based on SCr of 1.07 mg/dL (H)).    Allergies  Allergen Reactions   Levaquin [Levofloxacin In D5w] Anaphylaxis and Swelling   Iodinated Contrast Media Itching    Severe itching   Other     Dust mites and opiates (all of them)   Latex Dermatitis    Antimicrobials this admission: Ceftriaxone  2/14 x 1 Cefepime 2/14 >>   Dose adjustments this admission: N/A  Microbiology results: 2/14 UCx: pending    Thank you for allowing pharmacy to be a part of this patient's care.  Lorin Picket, PharmD 09/13/2022 2:18 PM
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
## 2022-09-13 NOTE — ED Notes (Signed)
Pt refuses to receive blood pressure. Pt states she is tired of receiving medical care, and that she is a DNR.

## 2022-09-13 NOTE — H&P (Signed)
History and Physical    Patient: Victoria Medina DOB: [birth year] DOA: 09/13/2022 DOS: the patient was seen and examined on 09/13/2022 PCP: Dewayne Shorter, MD  Patient coming from: SNF - [facility]; NOK: [place], [place], [phone]   Chief Complaint: AMS  HPI: Victoria Medina is a 62 y.o. female with medical history significant of CAD, CVA, urinary retention with indwelling suprapubic catheter, and HLD presenting with AMS.  She was seen at her facility about this on 2/12 and was noted to be psychotic which was different from her usual hallucinations.  The patient reports that she fell and hurts all over.  She also reports concern for UTI with drainage from her suprapubic cath and some URI symptoms for the last couple of days.    ER Course:  UTI.  PCP saw 2 days ago, has underlying psychiatric illness but worse in the last few days.  Agitated, combative.  Likely superimposed delirium from complicated UTI.       Review of Systems: As mentioned in the history of present illness. All other systems reviewed and are negative. Past Medical History:  Diagnosis Date   Acute ST elevation myocardial infarction (STEMI) of inferolateral wall ([place]) 01/25/2020   Blind    Cholecystitis 04/22/2021   History of migraine headaches    Hyperlipemia    Myocardial infarction [facility])    Stroke [facility])    Past Surgical History:  Procedure Laterality Date   ABDOMINAL HYSTERECTOMY     BACK SURGERY     CARDIAC CATHETERIZATION     CORONARY/GRAFT ACUTE MI REVASCULARIZATION N/A 01/25/2020   Procedure: Coronary/Graft Acute MI Revascularization;  Surgeon: Wellington Hampshire, MD;  Location: [place] [facility];  Service: Cardiovascular;  Laterality: N/A;   FRACTURE SURGERY     IR CATHETER TUBE CHANGE  08/10/2021   LEFT HEART CATH AND CORONARY ANGIOGRAPHY N/A 01/25/2020   Procedure: LEFT HEART CATH AND CORONARY ANGIOGRAPHY;  Surgeon: Wellington Hampshire, MD;  Location: [place] [facility];  Service:  Cardiovascular;  Laterality: N/A;   SKIN GRAFT Right    Social History:  reports that she has never smoked. She has never used smokeless tobacco. She reports current alcohol use. She reports that she does not use drugs.  Allergies  Allergen Reactions   Levaquin [Levofloxacin In D5w] Anaphylaxis and Swelling   Iodinated Contrast Media Itching    Severe itching   Other     Dust mites and opiates (all of them)   Latex Dermatitis    Family History  Problem Relation Age of Onset   Heart attack Mother    Hypertension Mother    Hypercholesterolemia Mother    Diabetes Mother    Stroke Father    Heart attack Father    Hypertension Father    Hypercholesterolemia Father    Diabetes Father    Diabetes Maternal Grandmother    Cancer - Other Maternal Grandmother     Prior to Admission medications   Medication Sig Start Date End Date Taking? Authorizing Provider  aluminum hydroxide [facility]) ointment Apply to Supra Pubic site topically every 24 hours as needed    [provider]  aspirin EC 81 MG tablet Take 81 mg by mouth daily. Swallow whole.    [provider]  Azelastine HCl 0.15 % SOLN Place 2 sprays into both nostrils 2 (two) times daily. 01/14/20   [provider]  bisacodyl (DULCOLAX) 10 MG suppository Place 10 mg rectally as needed for moderate constipation.  [provider]  clonazePAM (KLONOPIN) 0.5 MG tablet Take 1 tablet (0.5 mg total) by mouth at bedtime. 05/03/22   Lauree Chandler, NP  diazepam (VALIUM) 10 MG tablet Take 1 tablet (10 mg total) by mouth 4 (four) times daily. 09/08/22   Dewayne Shorter, MD  guaiFENesin (ROBITUSSIN) 100 MG/5ML liquid Take 10 mLs by mouth every 4 (four) hours as needed for cough or to loosen phlegm.    [provider]  Hydrocortisone Acetate (VAGISIL) 1 % CREA Apply 1 Application topically every 12 (twelve) hours as needed (For itching).    [provider]  ibuprofen (ADVIL) 800 MG tablet  Take 800 mg by mouth every 8 (eight) hours as needed (Bladder pain/discomfort). 10/06/21   [provider]  lansoprazole (PREVACID) 15 MG capsule Take 15 mg by mouth daily at 12 noon.     [provider]  levothyroxine (SYNTHROID) 25 MCG tablet Take 25 mcg by mouth daily before breakfast.    [provider]  magnesium hydroxide (MILK OF MAGNESIA) 400 MG/5ML suspension Take 5 mLs by mouth every 6 (six) hours as needed for mild constipation.    [provider]  menthol-cetylpyridinium (CEPACOL) 3 MG lozenge Take 1 lozenge by mouth every 6 (six) hours as needed for sore throat.    [provider]  nitroGLYCERIN (NITROSTAT) 0.4 MG SL tablet Place 0.4 mg under the tongue every 5 (five) minutes x 3 doses as needed for chest pain.     [provider]  nystatin-triamcinolone ointment (MYCOLOG) Apply 1 application topically 2 (two) times daily. 01/21/21   Zara Council A, PA-C  ondansetron (ZOFRAN-ODT) 4 MG disintegrating tablet Take 4 mg by mouth every 8 (eight) hours as needed for nausea or vomiting.    [provider]  tiZANidine (ZANAFLEX) 2 MG tablet Take 2 mg by mouth every 8 (eight) hours as needed for muscle spasms.    [provider]  Vibegron (GEMTESA) 75 MG TABS Take 75 mg by mouth daily. 03/08/22   Debroah Loop, PA-C  Vitamin D, Ergocalciferol, (DRISDOL) 50000 units CAPS capsule Take 50,000 Units by mouth every Monday.    [provider]    Physical Exam: Vitals:   09/13/22 1013 09/13/22 1015  BP: 132/73   Pulse: (!) 102   Resp: 20   Temp: 99.2 F (37.3 C)   SpO2: 98%   Weight:  76.5 kg  Height:  5' 1"$  (1.549 m)   General:  Appears calm and comfortable and is in NAD Eyes:   normal lids, iris; chronic visual impairment with nystagmus ENT:  grossly normal hearing, dry lips & tongue, mildly dry mm Neck:  no LAD, masses or thyromegaly Cardiovascular:  RRR, no m/r/g. No LE edema.  Respiratory:   CTA  bilaterally with no wheezes/rales/rhonchi.  Normal respiratory effort. Abdomen:  soft, NT other than suprapubic, ND; suprapubic catheter with mild purulent drainage Skin:  no rash or induration seen on limited exam Musculoskeletal:  LUE with contractures, no mobility; she is able to lift both her LE similarly Psychiatric:  cantankerous mood and affect, speech fluent and appropriate, denies hallucinations Neurologic:   moves all extremities in coordinated fashion   Radiological Exams on Admission: Independently reviewed - see discussion in A/P where applicable  CT Head Wo Contrast  Result Date: 09/13/2022 CLINICAL DATA:  Delirium, fall EXAM: CT HEAD WITHOUT CONTRAST TECHNIQUE: Contiguous axial images were obtained from the base of the skull through the vertex without intravenous contrast. RADIATION DOSE REDUCTION:  This exam was performed according to the departmental dose-optimization program which includes automated exposure control, adjustment of the mA and/or kV according to patient size and/or use of iterative reconstruction technique. COMPARISON:  CT head dated 10/09/2021 FINDINGS: Brain: No evidence of acute infarction, hemorrhage, hydrocephalus, extra-axial collection or mass lesion/mass effect. Encephalomalacic changes related to prior PCA distribution infarct. Extensive subcortical white matter and periventricular small vessel ischemic changes. Global cortical atrophy. Vascular: Intracranial atherosclerosis. Skull: Normal. Negative for fracture or focal lesion. Sinuses/Orbits: The visualized paranasal sinuses are essentially clear. The mastoid air cells are unopacified. Other: None. IMPRESSION: No evidence of acute intracranial abnormality. Atrophy with extensive small vessel ischemic changes. Old bilateral PCA distribution infarcts. Electronically Signed   By: Julian Hy M.D.   On: 09/13/2022 10:56   DG Chest Port 1 View  Result Date: 09/13/2022 CLINICAL DATA:  Confusion.  Altered  mental status. EXAM: PORTABLE CHEST 1 VIEW COMPARISON:  03/01/2022 FINDINGS: The lungs are clear without focal pneumonia, edema, pneumothorax or pleural effusion. The cardiopericardial silhouette is within normal limits for size. The visualized bony structures of the thorax are unremarkable. Telemetry leads overlie the chest. IMPRESSION: No active disease. Electronically Signed   By: Misty Stanley M.D.   On: 09/13/2022 10:44    EKG: Independently reviewed.  NSR with rate 90; nonspecific ST changes with no evidence of acute ischemia   Labs on Admission: I have personally reviewed the available labs and imaging studies at the time of the admission.  Pertinent labs:    Glucose 136 BUN 16/Creatinine 1.07/GFR 59 AST 50 Bili 1.3 WBC 13.2 Hgb 10.5 Platelets 428 TSH 1.988 COVID/flu negative UA: moderate Hgb, 20 ketones, large LE, + nitrite, 30 protein, few bacteria   Assessment and Plan: Principal Problem:   Complicated UTI (urinary tract infection) Active Problems:   History of stroke   Cortical blindness   Hypothyroidism (acquired)   Suprapubic catheter (HCC)   CAD (coronary artery disease)   Psychosis ([place])    Complicated UTI -Patient with chronic suprapubic catheter associated with urinary retention -She presented with psychosis - baseline? But also likely exacerbated by current infection -Patient presenting without sepsis physiology  -Multiple prior infections, most recently with Pseudomonas (05/28/22, 05/09/22, 10/09/21) -Today's UA again appears to demonstrate infection -Was given Rocephin in the ER but will transition to Cefepime, as prior cultures have been sensitive to this -Will admit to telemetry -Continue vibegron  Psychosis -Apparently with baseline hallucinations but her presentation today with clearly different -Likely resulting from delirium from UTI -If not clearing, she will need psychiatric consultation prior to dc -Continue Klonopin, Zyprexa  Chronic  visual impairment -Appears to be stable -Not on drops  H/o CVA -LUE hemiplegia - continue Valium, Tizanidine -Continue ASA  CAD -Continue ASA -No report of CP at this time  Cough -Continue PPI (Protonix for Prevacid due to formulary) -Negative CXR -COVID/flu negative -Will follow  Hypothyroidism -Continue Synthroid  DNR -I have reviewed ACP documents and patient confirmed DNR status to nursing staff      Advance Care Planning:   Code Status: DNR   Consults: PT/OT; nutrition; TOC team  DVT Prophylaxis: Lovenox  Family Communication: None present; I spoke with her sister by telephone at the time of admission  Severity of Illness: The appropriate patient status for this patient is INPATIENT. Inpatient status is judged to be reasonable and necessary in order to provide the required intensity of service to ensure the patient's safety. The patient's presenting symptoms, physical exam findings,  and initial radiographic and laboratory data in the context of their chronic comorbidities is felt to place them at high risk for further clinical deterioration. Furthermore, it is not anticipated that the patient will be medically stable for discharge from the hospital within 2 midnights of admission.   * I certify that at the point of admission it is my clinical judgment that the patient will require inpatient hospital care spanning beyond 2 midnights from the point of admission due to high intensity of service, high risk for further deterioration and high frequency of surveillance required.*  Author: Karmen Bongo, MD 09/13/2022 2:45 PM  For on call review [email or web address].

## 2022-09-13 NOTE — ED Provider Notes (Signed)
[facility] Provider Note    Event Date/Time   First MD Initiated Contact with Patient 09/13/22 1011     (approximate)   History   Altered Mental Status  HPI {Remember to add pertinent medical, surgical, social, and/or OB history to HPI:1} Kimbra Treinen is a 62 y.o. female history of diabetes, slurred speech, previous NSTEMI, prior STEMI, diabetes DVT  Patient was seen 2 days ago for an acute visit due to concern for hallucinations.  At that time she did not recognize the physician seeing her whom she typically would.   Per notes the patient was seen evaluate for psychosis.  Evidently has a longstanding history of hallucinations but was noted that the patient lacked insight into reality.  At that point a plan was developed to start on olanzapine and labs were ordered.  There is also mention of potentially transferring the patient for psychiatric evaluation  Physical Exam   Triage Vital Signs: ED Triage Vitals  Enc Vitals Group     BP 09/13/22 1013 132/73     Pulse Rate 09/13/22 1013 (!) 102     Resp 09/13/22 1013 20     Temp 09/13/22 1013 99.2 F (37.3 C)     Temp src --      SpO2 09/13/22 1013 98 %     Weight 09/13/22 1015 168 lb 10.4 oz (76.5 kg)     Height 09/13/22 1015 5' 1"$  (1.549 m)     Head Circumference --      Peak Flow --      Pain Score 09/13/22 1014 6     Pain Loc --      Pain Edu? --      Excl. in [place]? --     Most recent vital signs: Vitals:   09/13/22 1013  BP: 132/73  Pulse: (!) 102  Resp: 20  Temp: 99.2 F (37.3 C)  SpO2: 98%     General: Awake, no distress.  Patient seems to be talking nonsensically at times, is still somewhat disoriented, her speech is at times pressured, and she will be yelling out like "hello". (Of note, I have requested the patient have a sitter for safety, RN aware) CV:  Good peripheral perfusion.  Some very mild tachycardia no murmurs or rubs Resp:  Normal effort.  Clear bilaterally.  Normal work  of breathing.  No distress. Abd:  No distention.  Soft nontender none distended.  Patient has a suprapubic catheter.  No bleeding or surrounding erythema.  Of note it was reported by EMS that the "catheter got pulled out" but in fact the catheter does not appear to be pulled out rather what happened is the urinary catheter collection bag has been disconnected and missing from the distal tubing.  The catheter itself appears to be in place with tubing extending through the skin no balloon notable, and Other:  Moves all extremities but seems somewhat weak with flexion type contracture left upper extremity, patient reports always weak on that side  She reports the year to be 2023, she thinks it around April.   ED Results / Procedures / Treatments   Labs (all labs ordered are listed, but only abnormal results are displayed) Labs Reviewed  CBC - Abnormal; Notable for the following components:      Result Value   WBC 13.2 (*)    Hemoglobin 10.5 (*)    HCT 34.9 (*)    MCV 69.0 (*)    MCH 20.8 (*)  RDW 17.2 (*)    Platelets 428 (*)    All other components within normal limits  COMPREHENSIVE METABOLIC PANEL - Abnormal; Notable for the following components:   Potassium 3.4 (*)    CO2 21 (*)    Glucose, Bld 136 (*)    Creatinine, Ser 1.07 (*)    AST 50 (*)    Total Bilirubin 1.3 (*)    GFR, Estimated 59 (*)    All other components within normal limits  CBG MONITORING, ED - Abnormal; Notable for the following components:   Glucose-Capillary 117 (*)    All other components within normal limits  URINE CULTURE  RESP PANEL BY RT-PCR (FLU A&B, COVID) ARPGX2  TSH  URINALYSIS, ROUTINE W REFLEX MICROSCOPIC  AMMONIA     EKG  Inter by me at 1110 heart rate 90 QRS 80 QTc normal.  Normal sinus rhythm.  Mild T wave inversion in V1 and V2, again also noted slightly in V3.  No STEMI.   RADIOLOGY  CT head interpreted by me as negative for acute gross intracranial hemorrhage.  CT Head Wo  Contrast  Result Date: 09/13/2022 CLINICAL DATA:  Delirium, fall EXAM: CT HEAD WITHOUT CONTRAST TECHNIQUE: Contiguous axial images were obtained from the base of the skull through the vertex without intravenous contrast. RADIATION DOSE REDUCTION: This exam was performed according to the departmental dose-optimization program which includes automated exposure control, adjustment of the mA and/or kV according to patient size and/or use of iterative reconstruction technique. COMPARISON:  CT head dated 10/09/2021 FINDINGS: Brain: No evidence of acute infarction, hemorrhage, hydrocephalus, extra-axial collection or mass lesion/mass effect. Encephalomalacic changes related to prior PCA distribution infarct. Extensive subcortical white matter and periventricular small vessel ischemic changes. Global cortical atrophy. Vascular: Intracranial atherosclerosis. Skull: Normal. Negative for fracture or focal lesion. Sinuses/Orbits: The visualized paranasal sinuses are essentially clear. The mastoid air cells are unopacified. Other: None. IMPRESSION: No evidence of acute intracranial abnormality. Atrophy with extensive small vessel ischemic changes. Old bilateral PCA distribution infarcts. Electronically Signed   By: Julian Hy M.D.   On: 09/13/2022 10:56   DG Chest Port 1 View  Result Date: 09/13/2022 CLINICAL DATA:  Confusion.  Altered mental status. EXAM: PORTABLE CHEST 1 VIEW COMPARISON:  03/01/2022 FINDINGS: The lungs are clear without focal pneumonia, edema, pneumothorax or pleural effusion. The cardiopericardial silhouette is within normal limits for size. The visualized bony structures of the thorax are unremarkable. Telemetry leads overlie the chest. IMPRESSION: No active disease. Electronically Signed   By: Misty Stanley M.D.   On: 09/13/2022 10:44      PROCEDURES:  Critical Care performed: No  Procedures   MEDICATIONS ORDERED IN ED: Medications - No data to display   IMPRESSION / MDM /  [place] / ED COURSE  I reviewed the triage vital signs and the nursing notes.                              Differential diagnosis includes, but is not limited to, infection, urinary tract infection, delirium, viral infection, rule out intracranial hemorrhage, stroke etc. though these seem less likely, evaluation is very broad given the patient's underlying history of some hallucinations but now apparent acute abnormalities with worsening and also changes that seem suspicious for delirium.  I discussed the case with primary care Dr. Unk Lightning who evaluated the patient and ordered labs which the patient declined a few days ago.  she is  quite confused, picking at things, appears delirious and is disoriented.  ----------------------------------------- 12:54 PM on 09/13/2022 ----------------------------------------- Workup revealing a findings consistent with urinary tract infection and elevated white blood cell count.  She has an indwelling catheter.  Will start on Rocephin.  Culture sent.  Discussed with can consult with the hospitalist, Dr. Lorin Mercy who will be admitting.  Patient's presentation is most consistent with acute presentation with potential threat to life or bodily function.  The patient is on the cardiac monitor to evaluate for evidence of arrhythmia and/or significant heart rate changes.  Patient becoming very difficult to manage, pulling at lines and tubes, will medicate with benzodiazepine     FINAL CLINICAL IMPRESSION(S) / ED DIAGNOSES   Final diagnoses:  None     Rx / DC Orders   ED Discharge Orders     None        Note:  This document was prepared using Dragon voice recognition software and may include unintentional dictation errors.

## 2022-09-13 NOTE — ED Notes (Signed)
NT Akia currently safety sitting with pt.

## 2022-09-13 NOTE — ED Notes (Signed)
Patient transported to CT 

## 2022-09-14 ENCOUNTER — Other Ambulatory Visit: Payer: Self-pay

## 2022-09-14 ENCOUNTER — Encounter: Payer: Self-pay | Admitting: Internal Medicine

## 2022-09-14 DIAGNOSIS — N390 Urinary tract infection, site not specified: Secondary | ICD-10-CM | POA: Diagnosis not present

## 2022-09-14 LAB — BASIC METABOLIC PANEL
Anion gap: 14 (ref 5–15)
BUN: 13 mg/dL (ref 8–23)
CO2: 19 mmol/L — ABNORMAL LOW (ref 22–32)
Calcium: 8.4 mg/dL — ABNORMAL LOW (ref 8.9–10.3)
Chloride: 105 mmol/L (ref 98–111)
Creatinine, Ser: 0.9 mg/dL (ref 0.44–1.00)
GFR, Estimated: >60 mL/min (ref >60)
Glucose, Bld: 112 mg/dL — ABNORMAL HIGH (ref 70–99)
Potassium: 3.8 mmol/L (ref 3.5–5.1)
Sodium: 138 mmol/L (ref 135–145)

## 2022-09-14 LAB — CBC
HCT: 32.9 % — ABNORMAL LOW (ref 36.0–46.0)
Hemoglobin: 9.5 g/dL — ABNORMAL LOW (ref 12.0–15.0)
MCH: 20.8 pg — ABNORMAL LOW (ref 26.0–34.0)
MCHC: 28.9 g/dL — ABNORMAL LOW (ref 30.0–36.0)
MCV: 72 fL — ABNORMAL LOW (ref 80.0–100.0)
Platelets: 348 K/uL (ref 150–400)
RBC: 4.57 MIL/uL (ref 3.87–5.11)
RDW: 17.5 % — ABNORMAL HIGH (ref 11.5–15.5)
WBC: 10.3 K/uL (ref 4.0–10.5)
nRBC: 0 % (ref 0.0–0.2)

## 2022-09-14 LAB — MRSA NEXT GEN BY PCR, NASAL: MRSA by PCR Next Gen: DETECTED — AB

## 2022-09-14 MED ORDER — HALOPERIDOL LACTATE 5 MG/ML IJ SOLN
5.0000 mg | Freq: Four times a day (QID) | INTRAMUSCULAR | Status: DC | PRN
  Administered 2022-09-14: 5 mg via INTRAVENOUS
  Filled 2022-09-14: qty 1

## 2022-09-14 MED ORDER — SODIUM CHLORIDE 0.9 % IV SOLN
2.0000 g | Freq: Three times a day (TID) | INTRAVENOUS | Status: DC
  Administered 2022-09-14 – 2022-09-16 (×6): 2 g via INTRAVENOUS
  Filled 2022-09-14 (×2): qty 2
  Filled 2022-09-14: qty 12.5
  Filled 2022-09-14: qty 2
  Filled 2022-09-14 (×2): qty 12.5
  Filled 2022-09-14: qty 2
  Filled 2022-09-14: qty 12.5

## 2022-09-14 MED ORDER — MUPIROCIN 2 % EX OINT
TOPICAL_OINTMENT | Freq: Two times a day (BID) | CUTANEOUS | Status: DC
  Filled 2022-09-14: qty 22
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
## 2022-09-14 NOTE — NC FL2 (Signed)
[place] LEVEL OF CARE FORM     IDENTIFICATION  Patient Name: Victoria Medina Birthdate: January 27, 1961 Sex: female Admission Date (Current Location): 09/13/2022  [facility] and [place] Number:  Engineering geologist and Address:  [facility], [address] [place], [place] [address]      Provider Number: [id number]  Attending Physician Name and Address:  Darliss Cheney, MD  Relative Name and Phone Number:  Gea Halbrook- sister- [phone]    Current Level of Care: Hospital Recommended Level of Care: [place] Prior Approval Number:    Date Approved/Denied:   PASRR Number: [id number]  Discharge Plan: SNF    Current Diagnoses: Patient Active Problem List   Diagnosis Date Noted   Complicated UTI (urinary tract infection) 09/13/2022   Psychosis ([place]) 09/13/2022   DNR (do not resuscitate) 09/13/2022   HFrEF (heart failure with reduced ejection fraction) ([place]) 06/09/2022   Major depressive disorder, recurrent, moderate ([place]) 06/09/2022   Suprapubic catheter ([place]) 10/09/2021   History of stroke 10/09/2021   HLD (hyperlipidemia) 10/09/2021   CAD (coronary artery disease) 10/09/2021   Microcytic anemia 10/09/2021   Anxiety 05/10/2021   Cholecystitis 04/22/2021   Leukocytosis 04/22/2021   Acute ST elevation myocardial infarction (STEMI) of inferolateral wall ([place]) 01/25/2020   Slurred speech    Diabetes mellitus without complication ([place])    GERD (gastroesophageal reflux disease) 12/18/2014   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   Diabetes mellitus type 2 in obese ([place]) 01/22/2014   NSTEMI (non-ST elevated myocardial infarction) ([place]) 10/27/2013   Left knee pain 08/22/2013   High risk medications (not anticoagulants) long-term use 08/22/2013   Essential hypertension 07/11/2013   Hypothyroidism (acquired) 06/10/2013   Impingement syndrome of right shoulder 07/07/2012   Right shoulder pain 07/07/2012   Obesity 06/04/2012    Palpitations 06/04/2012   DVT (deep venous thrombosis) ([place]) 04/03/2012   PE (pulmonary thromboembolism) ([place]) 04/02/2012   Cervical intraepithelial glandular neoplasia 02/07/2011   Generalized anxiety disorder 02/07/2011   Insomnia 02/07/2011   Intractable chronic migraine without aura 02/07/2011   Mixed hyperlipidemia 02/07/2011   Myalgia and myositis, unspecified 02/07/2011   Obstructive sleep apnea (adult) (pediatric) 02/07/2011   Syncope and collapse 02/07/2011    Orientation RESPIRATION BLADDER Height & Weight     Self  Normal  (Suprapubic catheter) Weight: 76.5 kg Height:  5' 1"$  (154.9 cm)  BEHAVIORAL SYMPTOMS/MOOD NEUROLOGICAL BOWEL NUTRITION STATUS      Continent Diet (see discharge summary)  AMBULATORY STATUS COMMUNICATION OF NEEDS Skin   Extensive Assist Verbally Normal                       Personal Care Assistance Level of Assistance  Bathing, Feeding, Dressing Bathing Assistance: Maximum assistance Feeding assistance: Limited assistance Dressing Assistance: Maximum assistance     Functional Limitations Info             SPECIAL CARE FACTORS FREQUENCY                       Contractures Contractures Info: Not present    Additional Factors Info  Code Status, Allergies Code Status Info: DNR Allergies Info: Levaquin, iodinated contrast, dust mites and opiates, latex           Current Medications (09/14/2022):  This is the current hospital active medication list Current Facility-Administered Medications  Medication Dose Route Frequency Provider Last Rate Last Admin   acetaminophen (TYLENOL) tablet 650 mg  650 mg  Oral Q6H PRN Karmen Bongo, MD       Or   acetaminophen (TYLENOL) suppository 650 mg  650 mg Rectal Q6H PRN Karmen Bongo, MD       aspirin EC tablet 81 mg  81 mg Oral Daily Karmen Bongo, MD       azelastine (ASTELIN) 0.1 % nasal spray 2 spray  2 spray Each Nare BID PRN Karmen Bongo, MD       bisacodyl (DULCOLAX)  suppository 10 mg  10 mg Rectal Daily Karmen Bongo, MD       ceFEPIme (MAXIPIME) 2 g in sodium chloride 0.9 % 100 mL IVPB  2 g Intravenous Q12H Lorin Picket, [place] at 09/14/22 0449   clonazePAM (KLONOPIN) tablet 0.5 mg  0.5 mg Oral Ivery Quale, MD   0.5 mg at 09/13/22 2232   diazepam (VALIUM) tablet 10 mg  10 mg Oral QID Karmen Bongo, MD   10 mg at 09/13/22 2232   docusate sodium (COLACE) capsule 100 mg  100 mg Oral BID Karmen Bongo, MD   100 mg at 09/13/22 2232   enoxaparin (LOVENOX) injection 37.5 mg  0.5 mg/kg Subcutaneous Q24H Karmen Bongo, MD   37.5 mg at 09/13/22 2232   guaiFENesin (ROBITUSSIN) 100 MG/5ML liquid 10 mL  10 mL Oral Q4H PRN Karmen Bongo, MD       hydrALAZINE (APRESOLINE) injection 5 mg  5 mg Intravenous Q4H PRN Karmen Bongo, MD       lactated ringers infusion   Intravenous Continuous Karmen Bongo, MD 75 mL/hr at 09/13/22 1708 New Bag at 09/13/22 1708   levothyroxine (SYNTHROID) tablet 25 mcg  25 mcg Oral Q0600 Karmen Bongo, MD   25 mcg at 09/14/22 0655   magnesium citrate solution 1 Bottle  1 Bottle Oral PRN Karmen Bongo, MD       mirabegron ER [facility]) tablet 25 mg  25 mg Oral Daily Karmen Bongo, MD   25 mg at 09/13/22 1712   OLANZapine (ZYPREXA) tablet 2.5 mg  2.5 mg Oral BID Karmen Bongo, MD   2.5 mg at 09/13/22 2232   ondansetron (ZOFRAN) tablet 4 mg  4 mg Oral Q6H PRN Karmen Bongo, MD       Or   ondansetron [facility]) injection 4 mg  4 mg Intravenous Q6H PRN Karmen Bongo, MD       Oral care mouth rinse  15 mL Mouth Rinse 4 times per day Karmen Bongo, MD       Oral care mouth rinse  15 mL Mouth Rinse PRN Karmen Bongo, MD       oxyCODONE (Oxy IR/ROXICODONE) immediate release tablet 5 mg  5 mg Oral Q4H PRN Karmen Bongo, MD       pantoprazole (PROTONIX) EC tablet 40 mg  40 mg Oral Daily Karmen Bongo, MD   40 mg at 09/13/22 1525   polyethylene glycol (MIRALAX / GLYCOLAX) packet 17 g  17 g Oral Daily  Karmen Bongo, MD   17 g at 09/13/22 1532   sodium chloride flush (NS) 0.9 % injection 3 mL  3 mL Intravenous Lillia Mountain, MD   3 mL at 09/13/22 1711   tiZANidine (ZANAFLEX) tablet 2 mg  2 mg Oral Q8H PRN Karmen Bongo, MD       Current Outpatient Medications  Medication Sig Dispense Refill   aluminum hydroxide [facility]) ointment Apply 1 application  topically daily as needed (skin protection). (Apply to suprapubic site)  aspirin EC 81 MG tablet Take 81 mg by mouth daily.     Azelastine HCl 0.15 % SOLN Place 2 sprays into both nostrils 2 (two) times daily.     bisacodyl (DULCOLAX) 10 MG suppository Place 10 mg rectally daily.     clonazePAM (KLONOPIN) 0.5 MG tablet Take 1 tablet (0.5 mg total) by mouth at bedtime. 30 tablet 5   diazepam (VALIUM) 10 MG tablet Take 1 tablet (10 mg total) by mouth 4 (four) times daily. 60 tablet 0   guaiFENesin (ROBITUSSIN) 100 MG/5ML liquid Take 10 mLs by mouth every 4 (four) hours as needed for cough or to loosen phlegm.     hydrocortisone cream 1 % Apply 1 Application topically 2 (two) times daily as needed for itching. (Apply to labia)     ibuprofen (ADVIL) 800 MG tablet Take 800 mg by mouth every 8 (eight) hours as needed for mild pain or moderate pain.     lansoprazole (PREVACID) 15 MG capsule Take 15 mg by mouth daily.     levothyroxine (SYNTHROID) 25 MCG tablet Take 25 mcg by mouth daily before breakfast.     magnesium citrate SOLN Take 1 Bottle by mouth as needed for severe constipation.     magnesium hydroxide (MILK OF MAGNESIA) 400 MG/5ML suspension Take 5 mLs by mouth every 6 (six) hours as needed for mild constipation.     menthol-cetylpyridinium (CEPACOL) 3 MG lozenge Take 1 lozenge by mouth every 6 (six) hours as needed for sore throat.     nitroGLYCERIN (NITROSTAT) 0.4 MG SL tablet Place 0.4 mg under the tongue every 5 (five) minutes x 3 doses as needed for chest pain.      nystatin-triamcinolone ointment (MYCOLOG) Apply 1  Application topically 2 (two) times daily as needed (yeast). (Apply to perineal area)     OLANZapine (ZYPREXA) 2.5 MG tablet Take 2.5 mg by mouth 2 (two) times daily.     ondansetron (ZOFRAN-ODT) 4 MG disintegrating tablet Take 4 mg by mouth every 8 (eight) hours as needed for nausea or vomiting.     polyethylene glycol (MIRALAX / GLYCOLAX) 17 g packet Take 17 g by mouth daily.     tiZANidine (ZANAFLEX) 2 MG tablet Take 2 mg by mouth every 8 (eight) hours as needed for muscle spasms.     Vibegron (GEMTESA) 75 MG TABS Take 75 mg by mouth daily. 30 tablet 11   Vitamin D, Ergocalciferol, (DRISDOL) 50000 units CAPS capsule Take 50,000 Units by mouth every Monday.       Discharge Medications: Please see discharge summary for a list of discharge medications.  Relevant Imaging Results:  Relevant Lab Results:   Additional Information SSN:[id number]  [name], RN

## 2022-09-14 NOTE — TOC Initial Note (Signed)
Transition of Care [facility]) - Initial/Assessment Note    Patient Details  Name: Victoria Medina MRN: [id number] Date of Birth: [birth year]  Transition of Care [facility]) CM/SW Contact:    Victoria Hutching, RN Phone Number: 09/14/2022, 9:43 AM  Clinical Narrative:                 Patient being admitted to the hospital with UTI.  Patient has a suprapubic catheter, she is from [facility] skilled nursing where she has been a resident for several years.  Patient's mother lives in independent living at [facility] and her sister Victoria Medina lives in [place].  Per Victoria Medina over at [facility] patient always has behaviors but this is beyond her normal, which she says happens every so often.   Plan will be for patient to return to [facility] at discharge.   Expected Discharge Plan: Skilled Nursing Facility Barriers to Discharge: Continued Medical Work up   Patient Goals and CMS Choice            Expected Discharge Plan and Services     Post Acute Care Choice: Resumption of Svcs/PTA Provider Living arrangements for the past 2 months: [place]                 DME Arranged: N/A DME Agency: NA       HH Arranged: NA [place] Agency: NA        Prior Living Arrangements/Services Living arrangements for the past 2 months: [place] Lives with:: Facility Resident Patient language and need for interpreter reviewed:: Yes        Need for Family Participation in Patient Care: Yes (Comment) Care giver support system in place?: Yes (comment)   Criminal Activity/Legal Involvement Pertinent to Current Situation/Hospitalization: No - Comment as needed  Activities of Daily Living      Permission Sought/Granted Permission sought to share information with : Case Manager, Family Supports    Share Information with NAME: [place] granted to share info w AGENCY: [place] granted to share info w Relationship: sister  Permission granted to share info w  Contact Information: [phone]  Emotional Assessment         Alcohol / Substance Use: Not Applicable Psych Involvement: Yes (comment), Outpatient Provider  Admission diagnosis:  Complicated UTI (urinary tract infection) [N39.0] Patient Active Problem List   Diagnosis Date Noted   Complicated UTI (urinary tract infection) 09/13/2022   Psychosis ([place]) 09/13/2022   DNR (do not resuscitate) 09/13/2022   HFrEF (heart failure with reduced ejection fraction) ([place]) 06/09/2022   Major depressive disorder, recurrent, moderate ([place]) 06/09/2022   Suprapubic catheter ([place]) 10/09/2021   History of stroke 10/09/2021   HLD (hyperlipidemia) 10/09/2021   CAD (coronary artery disease) 10/09/2021   Microcytic anemia 10/09/2021   Anxiety 05/10/2021   Cholecystitis 04/22/2021   Leukocytosis 04/22/2021   Acute ST elevation myocardial infarction (STEMI) of inferolateral wall ([place]) 01/25/2020   Slurred speech    Diabetes mellitus without complication ([place])    GERD (gastroesophageal reflux disease) 12/18/2014   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   Diabetes mellitus type 2 in obese ([place]) 01/22/2014   NSTEMI (non-ST elevated myocardial infarction) ([place]) 10/27/2013   Left knee pain 08/22/2013   High risk medications (not anticoagulants) long-term use 08/22/2013   Essential hypertension 07/11/2013   Hypothyroidism (acquired) 06/10/2013   Impingement syndrome of right shoulder 07/07/2012   Right shoulder pain 07/07/2012   Obesity 06/04/2012   Palpitations 06/04/2012  DVT (deep venous thrombosis) ([place]) 04/03/2012   PE (pulmonary thromboembolism) ([place]) 04/02/2012   Cervical intraepithelial glandular neoplasia 02/07/2011   Generalized anxiety disorder 02/07/2011   Insomnia 02/07/2011   Intractable chronic migraine without aura 02/07/2011   Mixed hyperlipidemia 02/07/2011   Myalgia and myositis, unspecified 02/07/2011   Obstructive sleep apnea (adult) (pediatric) 02/07/2011   Syncope and collapse  02/07/2011   PCP:  Victoria Shorter, MD Pharmacy:   [place], [place] - [place] [address] [place] [address] Phone: [phone] Fax: [place], [place] [address] [place] [address] Phone: [phone] Fax: [phone]     Social Determinants of Health ([place]) Social History: SDOH Screenings   Tobacco Use: Low Risk  (09/13/2022)   SDOH Interventions:     Readmission Risk Interventions     No data to display

## 2022-09-14 NOTE — ED Notes (Signed)
This RN and EDT at bedside. Pt reporting that she needs to have a BM.  Pt assisted with 2 staff assist to bathroom with unsteady gait. Pt did not have BM. Peri care performed. Pt assisted back in bed. Pulse ox placed on pt but pt refusing to have BP taken. This RN explaining importance of monitoring BP but pt still refuses. Pt allowing this RN to place pulse ox on finger. Pt declining breakfast. MD made aware.

## 2022-09-14 NOTE — Plan of Care (Signed)
  Problem: Health Behavior/Discharge Planning: Goal: Ability to manage health-related needs will improve Outcome: Progressing   Problem: Activity: Goal: Risk for activity intolerance will decrease Outcome: Progressing   Problem: Coping: Goal: Level of anxiety will decrease Outcome: Progressing   Problem: Nutrition: Goal: Adequate nutrition will be maintained Outcome: Progressing   Problem: Safety: Goal: Ability to remain free from injury will improve Outcome: Progressing

## 2022-09-14 NOTE — ED Notes (Signed)
Admission MD at bedside.  

## 2022-09-14 NOTE — Consult Note (Signed)
Pharmacy Antibiotic Note  Victoria Medina is a 62 y.o. female admitted on 09/13/2022 with UTI. Patient has history of Pseudomonas UTI in 04/2022.  Pharmacy has been consulted for cefepime dosing.   Plan: adjust cefepime to 2 grams IV every 8 hours -Follow renal function for adjustments  Height: 5' 1"$  (154.9 cm) Weight: 76.5 kg (168 lb 10.4 oz) IBW/kg (Calculated) : 47.8  Temp (24hrs), Avg:98 F (36.7 C), Min:98 F (36.7 C), Max:98.1 F (36.7 C)  Recent Labs  Lab 09/13/22 1016 09/14/22 0245  WBC 13.2* 10.3  CREATININE 1.07* 0.90     Estimated Creatinine Clearance: 61.5 mL/min (by C-G formula based on SCr of 0.9 mg/dL).    Allergies  Allergen Reactions   Levaquin [Levofloxacin In D5w] Anaphylaxis and Swelling   Iodinated Contrast Media Itching    Severe itching   Other     Dust mites and opiates (all of them)   Latex Dermatitis    Antimicrobials this admission: ceftriaxone  2/14 x 1 cefepime 2/14 >>   Microbiology results: 2/14 UCx: pending    Thank you for allowing pharmacy to be a part of this patient's care.  Dallie Piles, PharmD 09/14/2022 10:53 AM

## 2022-09-14 NOTE — ED Notes (Signed)
Md messaged in regard PRN medication due to pt becoming anxious and hallucinating.

## 2022-09-14 NOTE — ED Notes (Signed)
When repositioning pt pt pulled arm upwards pulling IV tubing and pulling IV out. RN attempting to obtain IV access again.

## 2022-09-14 NOTE — ED Notes (Signed)
Pt visualized from door slumped down in bed. Pt yelling and hallucinating in room telling "the people to get out". Pt IV tubing disconnected by pt. RN able to replace tubing and keep IV patent. Pt repositioned in bed by this RN and Metallurgist. Bed alarm in place. One safety mitten placed on right hand.

## 2022-09-14 NOTE — ED Notes (Signed)
PT at bedside.

## 2022-09-14 NOTE — ED Notes (Addendum)
RN at bedside with student RN and Caitlyn EDT. Pt down in the bed again and sideways. Upon RN at bedside pt swinging right arm at RN. RN explaining staff is trying to adjust pt in bed for safety - seizure pads applied. While repositioning pt, pt attempted to bite Rns hand. This RN explaining behavior is inappropriate. Pt proceeded to call this RN, "bitch." Safety mitts applied to left hand as well due to pt attempting to get right hand out of mitt.

## 2022-09-14 NOTE — ED Notes (Signed)
RN at bedside to answer call bell. Pt asking if catheters are still in place. This RN explaining both catheter IV and suprapubic catheter remain intact.

## 2022-09-14 NOTE — ED Notes (Signed)
Pt continues to attempt to bite and hit staff and take off mitts and pull IV out. MD made aware

## 2022-09-14 NOTE — ED Notes (Addendum)
Placed patient on 2L while sleeping. Patient denies any SOB at this time. Patient's O2 was in the low 90's-high 80's, and immediately came up to the mid 90's when she awoke.

## 2022-09-14 NOTE — Evaluation (Signed)
Occupational Therapy Evaluation Patient Details Name: Victoria Medina MRN: [id number] DOB: [birth year] Today's Date: 09/14/2022   History of Present Illness Victoria Medina is a 62 y.o. female with medical history significant of CAD, CVA, urinary retention with indwelling suprapubic catheter, and HLD presenting with AMS.  She was seen at her facility about this on 2/12 and was noted to be psychotic which was different from her usual hallucinations.  The patient reports that she fell and hurts all over.  She also reports concern for UTI with drainage from her suprapubic cath and some URI symptoms for the last couple of days.   Clinical Impression   [name] presents with generalized weakness, limited endurance, and impaired cognition. Prior to admission, pt lives at the SNF within [facility]. She uses a WC for ambulation at baseline, and receives assistance for all BADL. During today's evaluation, pt is oriented to self and aware she is at "a hospital." She is not oriented to date, first stating it is "April 2015," then when asked about date again, she states, "I never know that. I use Alexa for that." Pt is aware she had a fall at the facility PTA. She has some understanding that she has a UTI, but states this is because "someone pulled my catheter out." At time of admission, pt reportedly was agitated, combative, and with elaborate hallucinations. At present, this seems to have resolved: pt is calm, able to follow simple directions, and largely appropriate in conversation. Pt appears to be at her baseline level of fxl mobility and ADL performance. No further OT services necessary at this time. Recommend pt DC back to SNF at [facility].      Recommendations for follow up therapy are one component of a multi-disciplinary discharge planning process, led by the attending physician.  Recommendations may be updated based on patient status, additional functional criteria and insurance authorization.   Follow  Up Recommendations  Skilled nursing-short term rehab (<3 hours/day)     Assistance Recommended at Discharge Frequent or constant Supervision/Assistance  Patient can return home with the following A lot of help with walking and/or transfers;A lot of help with bathing/dressing/bathroom;Assistance with cooking/housework;Direct supervision/assist for medications management;Assist for transportation;Help with stairs or ramp for entrance;Direct supervision/assist for financial management;Assistance with feeding    Functional Status Assessment  Patient has had a recent decline in their functional status and demonstrates the ability to make significant improvements in function in a reasonable and predictable amount of time.  Equipment Recommendations  None recommended by OT    Recommendations for Other Services       Precautions / Restrictions Precautions Precautions: Fall Restrictions Weight Bearing Restrictions: No      Mobility Bed Mobility Overal bed mobility: Needs Assistance Bed Mobility: Supine to Sit, Sit to Supine     Supine to sit: Mod assist Sit to supine: Mod assist        Transfers                   General transfer comment: NT      Balance Overall balance assessment: Needs assistance Sitting-balance support: Single extremity supported, Feet unsupported Sitting balance-Leahy Scale: Fair       Standing balance-Leahy Scale: Zero Standing balance comment: Pt uses WC at baseline                           ADL either performed or assessed with clinical judgement   ADL Overall  ADL's : Needs assistance/impaired;At baseline                                       General ADL Comments: At baseline, pt requires Mod-Max A for all ADL     Vision Baseline Vision/History: 2 Legally blind Patient Visual Report: No change from baseline Additional Comments: Pt reports able to see "shadows."     Perception     Praxis       Pertinent Vitals/Pain Pain Assessment Pain Assessment: No/denies pain     Hand Dominance Right   Extremity/Trunk Assessment Upper Extremity Assessment Upper Extremity Assessment: LUE deficits/detail LUE Deficits / Details: s/p multiple CVAs, L UE paralysis, with arm in contracted position, wrist flexed, fingers fisted LUE Coordination: decreased fine motor;decreased gross motor   Lower Extremity Assessment Lower Extremity Assessment: Defer to PT evaluation       Communication Communication Communication: No difficulties   Cognition Arousal/Alertness: Awake/alert Behavior During Therapy: WFL for tasks assessed/performed Overall Cognitive Status: History of cognitive impairments - at baseline                                 General Comments: Pt is oriented to self, aware she is at "a hospital." Not oriented to date or situation. Upon hospital admission, pt reported to be combative and agitated. This seems to be resolved at present.     General Comments       Exercises     Shoulder Instructions      Home Living Family/patient expects to be discharged to:: Skilled nursing facility                                        Prior Functioning/Environment Prior Level of Function : Needs assist             Mobility Comments: Uses WC at baseline. ADLs Comments: Pt receives assistance for all BADLs at baseline.        OT Problem List: Decreased range of motion;Impaired balance (sitting and/or standing);Decreased coordination;Impaired sensation;Impaired vision/perception;Decreased cognition;Decreased activity tolerance;Decreased strength      OT Treatment/Interventions:      OT Goals(Current goals can be found in the care plan section) Acute Rehab OT Goals Patient Stated Goal: pt unable to state goal  OT Frequency:      Co-evaluation              AM-PAC OT "6 Clicks" Daily Activity     Outcome Measure Help from another person  eating meals?: A Lot Help from another person taking care of personal grooming?: A Lot Help from another person toileting, which includes using toliet, bedpan, or urinal?: A Lot Help from another person bathing (including washing, rinsing, drying)?: A Lot Help from another person to put on and taking off regular upper body clothing?: A Lot Help from another person to put on and taking off regular lower body clothing?: A Lot 6 Click Score: 12   End of Session    Activity Tolerance: Patient tolerated treatment well Patient left: in bed;with call bell/phone within reach;Other (comment) (w/ MD in room)  OT Visit Diagnosis: Other abnormalities of gait and mobility (R26.89);Other symptoms and signs involving cognitive function;Hemiplegia and hemiparesis Hemiplegia - Right/Left: Left  Time: [id number] OT Time Calculation (min): 20 min Charges:  OT General Charges $OT Visit: 1 Visit OT Evaluation $OT Eval Low Complexity: 1 Low OT Treatments $Self Care/Home Management : 8-22 mins Josiah Lobo, PhD, MS, OTR/L 09/14/22, 9:56 AM

## 2022-09-14 NOTE — Evaluation (Signed)
Physical Therapy Evaluation Patient Details Name: Victoria Medina MRN: [id number] DOB: 07/29/1961 Today's Date: 09/14/2022  History of Present Illness  presented to ER secondary to AMS, hallucinations, fall; admitted for management of complicated UTI (chronic suprapubic catheter)  Clinical Impression  Patient resting in bed upon arrival to room; RN at bedside to replace/secure R UE IV.  Patient alert and oriented to self, general location; intermittently follows simple commands.  Easily agitated and highly distracted by internal stimuli; swatting at/talking to hallucinations throughout session.  L UE in hyper-flexed, contracted position (baseline); able to partially extend, but unable to functionally utilize.  R UE and bilat LEs grossly functional for basic mobility efforts; generally uncoodinated/ataxic in movement.  Currently requiring mod assist for bed mobility and unsupported sitting balance.  Requires step by step cuing for task initiation and sequencing; optimal performance/participation with step-by-step cuing and reassurance (due to reported visual deficits). Once upright, sits 5-10 seconds and spontaneously returns self to supine, generally agitated with situation.  Unable to redirect; additional mobility/OOB tasks deferred as result. Would benefit from skilled PT to address above deficits and promote optimal return to PLOF.; recommend transition to STR upon discharge from acute hospitalization, pending ability to participate progress with acute trial (3-5 visits) of skilled PT services.        Recommendations for follow up therapy are one component of a multi-disciplinary discharge planning process, led by the attending physician.  Recommendations may be updated based on patient status, additional functional criteria and insurance authorization.  Follow Up Recommendations Skilled nursing-short term rehab (<3 hours/day) (pending ability to participate/progress with skilled intervention) Can  patient physically be transported by private vehicle: No    Assistance Recommended at Discharge Frequent or constant Supervision/Assistance  Patient can return home with the following  A lot of help with walking and/or transfers;A lot of help with bathing/dressing/bathroom    Equipment Recommendations    Recommendations for Other Services       Functional Status Assessment Patient has had a recent decline in their functional status and demonstrates the ability to make significant improvements in function in a reasonable and predictable amount of time.     Precautions / Restrictions Precautions Precautions: Fall Restrictions Weight Bearing Restrictions: No      Mobility  Bed Mobility Overal bed mobility: Needs Assistance Bed Mobility: Supine to Sit, Sit to Supine     Supine to sit: Mod assist Sit to supine: Mod assist   General bed mobility comments: step by step cuing for task initiation and sequencing; optimal performance/participation with step-by-step cuing and reassurance (due to reported visual deficits). Once upright, sits 5-10 seconds and spontaneously returns self to supine, generally agitated with situation.  Unable to redirect.  Repositioned to comfort    Transfers                   General transfer comment: unsafe/unable    Ambulation/Gait               General Gait Details: unsafe/unable  Stairs            Wheelchair Mobility    Modified Rankin (Stroke Patients Only)       Balance Overall balance assessment: Needs assistance   Sitting balance-Leahy Scale: Poor                                       Pertinent Vitals/Pain Pain Assessment Pain  Assessment: No/denies pain    Home Living Family/patient expects to be discharged to:: Skilled nursing facility                   Additional Comments: Long-term resident at [facility], participating with restorative mobility services    Prior Function Prior  Level of Function : Needs assist             Mobility Comments: WC level as primary mobility at baseline, SPT to/from seating surfaces.  Does endorse consistent ambulation trials with restorative mobility program (holding rail to wall)-will verify with family/facility as able ADLs Comments: Pt receives assistance for all BADLs at baseline.     Hand Dominance   Dominant Hand: Right    Extremity/Trunk Assessment   Upper Extremity Assessment Upper Extremity Assessment: LUE deficits/detail;RUE deficits/detail RUE Deficits / Details: grossly at least 4/5, generally ataxic/poorly coordinated; intermittently reaching/swatting at things (hallucinations) in room LUE Deficits / Details: s/p multiple CVAs, L UE paralysis, with arm in contracted position, wrist flexed, fingers fisted    Lower Extremity Assessment Lower Extremity Assessment:  (grossly 4/5 R LE, 3+ to 4-/5 L LE)       Communication   Communication: No difficulties  Cognition Arousal/Alertness: Awake/alert Behavior During Therapy: Agitated, Anxious Overall Cognitive Status: No family/caregiver present to determine baseline cognitive functioning                                 General Comments: Oriented to self, location; intermittently follows simple commands, but highly distractible by internal environment (hallucinating, seeing/hearing things throughout session).  Easily agitated, limited ability to redirect        General Comments      Exercises     Assessment/Plan    PT Assessment Patient needs continued PT services  PT Problem List Decreased strength;Decreased range of motion;Decreased mobility;Decreased activity tolerance;Decreased coordination;Decreased balance;Decreased cognition;Decreased knowledge of use of DME;Decreased safety awareness;Decreased knowledge of precautions       PT Treatment Interventions DME instruction;Functional mobility training;Therapeutic activities;Therapeutic  exercise;Balance training;Patient/family education    PT Goals (Current goals can be found in the Care Plan section)  Acute Rehab PT Goals PT Goal Formulation: Patient unable to participate in goal setting Time For Goal Achievement: 09/28/22 Potential to Achieve Goals: Fair    Frequency Min 2X/week     Co-evaluation               AM-PAC PT "6 Clicks" Mobility  Outcome Measure Help needed turning from your back to your side while in a flat bed without using bedrails?: A Lot Help needed moving from lying on your back to sitting on the side of a flat bed without using bedrails?: A Lot Help needed moving to and from a bed to a chair (including a wheelchair)?: Total Help needed standing up from a chair using your arms (e.g., wheelchair or bedside chair)?: Total Help needed to walk in hospital room?: Total Help needed climbing 3-5 steps with a railing? : Total 6 Click Score: 8    End of Session   Activity Tolerance: Treatment limited secondary to agitation Patient left: in bed;with call bell/phone within reach Nurse Communication: Mobility status PT Visit Diagnosis: Muscle weakness (generalized) (M62.81)    Time: [id number] PT Time Calculation (min) (ACUTE ONLY): 18 min   Charges:   PT Evaluation $PT Eval Moderate Complexity: 1 Mod          Jove Beyl H.  Owens Shark, PT, DPT, NCS 09/14/22, 10:06 PM [phone]

## 2022-09-15 DIAGNOSIS — N390 Urinary tract infection, site not specified: Secondary | ICD-10-CM | POA: Diagnosis not present

## 2022-09-15 LAB — BASIC METABOLIC PANEL
Anion gap: 11 (ref 5–15)
BUN: 11 mg/dL (ref 8–23)
CO2: 23 mmol/L (ref 22–32)
Calcium: 7.9 mg/dL — ABNORMAL LOW (ref 8.9–10.3)
Chloride: 101 mmol/L (ref 98–111)
Creatinine, Ser: 0.83 mg/dL (ref 0.44–1.00)
GFR, Estimated: >60 mL/min (ref >60)
Glucose, Bld: 86 mg/dL (ref 70–99)
Potassium: 3.2 mmol/L — ABNORMAL LOW (ref 3.5–5.1)
Sodium: 135 mmol/L (ref 135–145)

## 2022-09-15 LAB — CBC WITH DIFFERENTIAL/PLATELET
Abs Immature Granulocytes: 0.03 10*3/uL (ref 0.00–0.07)
Basophils Absolute: 0.1 10*3/uL (ref 0.0–0.1)
Basophils Relative: 1 %
Eosinophils Absolute: 0.3 10*3/uL (ref 0.0–0.5)
Eosinophils Relative: 4 %
HCT: 29.6 % — ABNORMAL LOW (ref 36.0–46.0)
Hemoglobin: 8.8 g/dL — ABNORMAL LOW (ref 12.0–15.0)
Immature Granulocytes: 0 %
Lymphocytes Relative: 20 %
Lymphs Abs: 1.5 10*3/uL (ref 0.7–4.0)
MCH: 20.9 pg — ABNORMAL LOW (ref 26.0–34.0)
MCHC: 29.7 g/dL — ABNORMAL LOW (ref 30.0–36.0)
MCV: 70.1 fL — ABNORMAL LOW (ref 80.0–100.0)
Monocytes Absolute: 0.7 10*3/uL (ref 0.1–1.0)
Monocytes Relative: 9 %
Neutro Abs: 5 10*3/uL (ref 1.7–7.7)
Neutrophils Relative %: 66 %
Platelets: 300 10*3/uL (ref 150–400)
RBC: 4.22 MIL/uL (ref 3.87–5.11)
RDW: 17.4 % — ABNORMAL HIGH (ref 11.5–15.5)
WBC: 7.6 10*3/uL (ref 4.0–10.5)
nRBC: 0 % (ref 0.0–0.2)

## 2022-09-15 LAB — HIV ANTIBODY (ROUTINE TESTING W REFLEX): HIV Screen 4th Generation wRfx: NONREACTIVE

## 2022-09-15 MED ORDER — POTASSIUM CHLORIDE CRYS ER 20 MEQ PO TBCR
40.0000 meq | EXTENDED_RELEASE_TABLET | ORAL | Status: AC
  Administered 2022-09-15 (×2): 40 meq via ORAL
  Filled 2022-09-15 (×2): qty 2

## 2022-09-15 MED ORDER — ENSURE ENLIVE PO LIQD
237.0000 mL | Freq: Two times a day (BID) | ORAL | Status: DC
  Administered 2022-09-16: 237 mL via ORAL

## 2022-09-15 MED ORDER — ADULT MULTIVITAMIN W/MINERALS CH
1.0000 | ORAL_TABLET | Freq: Every day | ORAL | Status: DC
  Administered 2022-09-15 – 2022-09-16 (×2): 1 via ORAL
  Filled 2022-09-15 (×2): qty 1

## 2022-09-15 MED ORDER — SALINE SPRAY 0.65 % NA SOLN
1.0000 | NASAL | Status: DC | PRN
  Administered 2022-09-15: 1 via NASAL
  Filled 2022-09-15: qty 44

## 2022-09-15 NOTE — Plan of Care (Signed)

## 2022-09-15 NOTE — Care Management Important Message (Signed)
Important Message  Patient Details  Name: Victoria Medina MRN: [id number] Date of Birth: 1961-05-26   Medicare Important Message Given:  Yes     [name] 09/15/2022, 2:30 PM

## 2022-09-15 NOTE — Consult Note (Signed)
[facility] Face-to-Face Psychiatry Consult   Reason for Consult:  Aggressive and odd behaviors reported by staff Referring Physician:  Darliss Cheney, MD Patient Identification: Victoria Medina MRN:  [id number] Principal Diagnosis: Complicated UTI (urinary tract infection) Diagnosis:  Principal Problem:   Complicated UTI (urinary tract infection) Active Problems:   Major depressive disorder, recurrent, moderate (HCC)   Cortical blindness   Hypothyroidism (acquired)   Suprapubic catheter ([place])   History of stroke   CAD (coronary artery disease)   Psychosis ([place])   DNR (do not resuscitate)   Total Time spent with patient: 45 minutes  Subjective: "Right now I'm not sure of anything."  HPI:  Victoria Medina is a 62 y.o. female patient with medical history significant of CAD, CVA, urinary retention with indwelling suprapubic catheter, and HLD admitted due to concerns about AMS in the presence of UTI. Patient found awake and alert in bed. She was able to answer questions but did not appear able to provide much detail in her responses. Pt denied having auditory or visual hallucinations and said her mood was not "worse than usual. average." She endorsed feeling very anxious and said sleep is "not good."  Denied suicidal and homicidal ideations.   Past Psychiatric History: Depression, anxiety  Risk to Self:  none Risk to Others:  none Prior Inpatient Therapy:  unsure Prior Outpatient Therapy:  unknown  Past Medical History:  Past Medical History:  Diagnosis Date   Acute ST elevation myocardial infarction (STEMI) of inferolateral wall ([place]) 01/25/2020   Blind    Cholecystitis 04/22/2021   History of migraine headaches    Hyperlipemia    Myocardial infarction ([place])    Stroke [facility])     Past Surgical History:  Procedure Laterality Date   ABDOMINAL HYSTERECTOMY     BACK SURGERY     CARDIAC CATHETERIZATION     CORONARY/GRAFT ACUTE MI REVASCULARIZATION N/A 01/25/2020   Procedure: Coronary/Graft Acute  MI Revascularization;  Surgeon: Wellington Hampshire, MD;  Location: [place] [facility];  Service: Cardiovascular;  Laterality: N/A;   FRACTURE SURGERY     IR CATHETER TUBE CHANGE  08/10/2021   LEFT HEART CATH AND CORONARY ANGIOGRAPHY N/A 01/25/2020   Procedure: LEFT HEART CATH AND CORONARY ANGIOGRAPHY;  Surgeon: Wellington Hampshire, MD;  Location: [place] [facility];  Service: Cardiovascular;  Laterality: N/A;   SKIN GRAFT Right    Family History:  Family History  Problem Relation Age of Onset   Heart attack Mother    Hypertension Mother    Hypercholesterolemia Mother    Diabetes Mother    Stroke Father    Heart attack Father    Hypertension Father    Hypercholesterolemia Father    Diabetes Father    Diabetes Maternal Grandmother    Cancer - Other Maternal Grandmother    Family Psychiatric  History: Unkown Social History:  Social History   Substance and Sexual Activity  Alcohol Use Yes   Comment: occasional     Social History   Substance and Sexual Activity  Drug Use No    Social History   Socioeconomic History   Marital status: Single    Spouse name: Not on file   Number of children: Not on file   Years of education: Not on file   Highest education level: Not on file  Occupational History   Not on file  Tobacco Use   Smoking status: Never   Smokeless tobacco: Never  Vaping Use   Vaping Use: Never used  Substance and Sexual Activity   Alcohol use: Yes    Comment: occasional   Drug use: No   Sexual activity: Not on file  Other Topics Concern   Not on file  Social History Narrative   Not on file   Social Determinants of Health   Financial Resource Strain: Not on file  Food Insecurity: No Food Insecurity (09/14/2022)   Hunger Vital Sign    Worried About Running Out of Food in the Last Year: Never true    Ran Out of Food in the Last Year: Never true  Transportation Needs: No Transportation Needs (09/14/2022)   PRAPARE - Radiographer, therapeutic (Medical): No    Lack of Transportation (Non-Medical): No  Physical Activity: Not on file  Stress: Not on file  Social Connections: Not on file   Additional Social History:    Allergies:   Allergies  Allergen Reactions   Levaquin [Levofloxacin In D5w] Anaphylaxis and Swelling   Iodinated Contrast Media Itching    Severe itching   Other     Dust mites and opiates (all of them)   Latex Dermatitis    Labs:  Results for orders placed or performed during the hospital encounter of 09/13/22 (from the past 48 hour(s))  Basic metabolic panel     Status: Abnormal   Collection Time: 09/14/22  2:45 AM  Result Value Ref Range   Sodium 138 135 - 145 mmol/L   Potassium 3.8 3.5 - 5.1 mmol/L   Chloride 105 98 - 111 mmol/L   CO2 19 (L) 22 - 32 mmol/L   Glucose, Bld 112 (H) 70 - 99 mg/dL    Comment: Glucose reference range applies only to samples taken after fasting for at least 8 hours.   BUN 13 8 - 23 mg/dL   Creatinine, Ser 0.90 0.44 - 1.00 mg/dL   Calcium 8.4 (L) 8.9 - 10.3 mg/dL   GFR, Estimated >60 >60 mL/min    Comment: (NOTE) Calculated using the CKD-EPI Creatinine Equation (2021)    Anion gap 14 5 - 15    Comment: Performed at [facility], [address] [place], [place] [address]  CBC     Status: Abnormal   Collection Time: 09/14/22  2:45 AM  Result Value Ref Range   WBC 10.3 4.0 - 10.5 K/uL   RBC 4.57 3.87 - 5.11 MIL/uL   Hemoglobin 9.5 (L) 12.0 - 15.0 g/dL   HCT 32.9 (L) 36.0 - 46.0 %   MCV 72.0 (L) 80.0 - 100.0 fL   MCH 20.8 (L) 26.0 - 34.0 pg   MCHC 28.9 (L) 30.0 - 36.0 g/dL   RDW 17.5 (H) 11.5 - 15.5 %   Platelets 348 150 - 400 K/uL   nRBC 0.0 0.0 - 0.2 %    Comment: Performed at [facility], [address], [place], [place] [address]  MRSA Next Gen by PCR, Nasal     Status: Abnormal   Collection Time: 09/14/22  2:56 PM   Specimen: Nasal Mucosa; Nasal Swab  Result Value Ref Range   MRSA by PCR Next Gen DETECTED (A) NOT  DETECTED    Comment: RESULT CALLED TO, READ BACK BY AND VERIFIED WITH: C/ADAYENI AJIBADE 09/14/22 1700 SJL (NOTE) The GeneXpert MRSA Assay (FDA approved for NASAL specimens only), is one component of a comprehensive MRSA colonization surveillance program. It is not intended to diagnose MRSA infection nor to guide or monitor treatment for MRSA infections. Test performance  is not FDA approved in patients less than 28 years old. Performed at [facility], [address] [place], [place] [address]   CBC with Differential/Platelet     Status: Abnormal   Collection Time: 09/15/22  2:52 AM  Result Value Ref Range   WBC 7.6 4.0 - 10.5 K/uL   RBC 4.22 3.87 - 5.11 MIL/uL   Hemoglobin 8.8 (L) 12.0 - 15.0 g/dL    Comment: Reticulocyte Hemoglobin testing may be clinically indicated, consider ordering this additional test [id number]    HCT 29.6 (L) 36.0 - 46.0 %   MCV 70.1 (L) 80.0 - 100.0 fL   MCH 20.9 (L) 26.0 - 34.0 pg   MCHC 29.7 (L) 30.0 - 36.0 g/dL   RDW 17.4 (H) 11.5 - 15.5 %   Platelets 300 150 - 400 K/uL   nRBC 0.0 0.0 - 0.2 %   Neutrophils Relative % 66 %   Neutro Abs 5.0 1.7 - 7.7 K/uL   Lymphocytes Relative 20 %   Lymphs Abs 1.5 0.7 - 4.0 K/uL   Monocytes Relative 9 %   Monocytes Absolute 0.7 0.1 - 1.0 K/uL   Eosinophils Relative 4 %   Eosinophils Absolute 0.3 0.0 - 0.5 K/uL   Basophils Relative 1 %   Basophils Absolute 0.1 0.0 - 0.1 K/uL   Immature Granulocytes 0 %   Abs Immature Granulocytes 0.03 0.00 - 0.07 K/uL    Comment: Performed at [facility], [address] [place], [place] [id number]  Basic metabolic panel     Status: Abnormal   Collection Time: 09/15/22  2:52 AM  Result Value Ref Range   Sodium 135 135 - 145 mmol/L   Potassium 3.2 (L) 3.5 - 5.1 mmol/L   Chloride 101 98 - 111 mmol/L   CO2 23 22 - 32 mmol/L   Glucose, Bld 86 70 - 99 mg/dL    Comment: Glucose reference range applies only to samples taken after fasting for at least 8 hours.    BUN 11 8 - 23 mg/dL   Creatinine, Ser 0.83 0.44 - 1.00 mg/dL   Calcium 7.9 (L) 8.9 - 10.3 mg/dL   GFR, Estimated >60 >60 mL/min    Comment: (NOTE) Calculated using the CKD-EPI Creatinine Equation (2021)    Anion gap 11 5 - 15    Comment: Performed at [facility], [address] [place], [place] [address]    Current Facility-Administered Medications  Medication Dose Route Frequency Provider Last Rate Last Admin   acetaminophen (TYLENOL) tablet 650 mg  650 mg Oral Q6H PRN Karmen Bongo, MD       Or   acetaminophen (TYLENOL) suppository 650 mg  650 mg Rectal Q6H PRN Karmen Bongo, MD       aspirin EC tablet 81 mg  81 mg Oral Daily Karmen Bongo, MD   81 mg at 09/15/22 0919   azelastine (ASTELIN) 0.1 % nasal spray 2 spray  2 spray Each Nare BID PRN Karmen Bongo, MD       bisacodyl (DULCOLAX) suppository 10 mg  10 mg Rectal Daily Karmen Bongo, MD       ceFEPIme (MAXIPIME) 2 g in sodium chloride 0.9 % 100 mL IVPB  2 g Intravenous Q8H Dallie Piles, RPH 200 mL/hr at 09/15/22 0527 2 g at 09/15/22 0527   clonazePAM (KLONOPIN) tablet 0.5 mg  0.5 mg Oral Ivery Quale, MD   0.5 mg at 09/14/22 2127   diazepam (VALIUM) tablet 10 mg  10 mg Oral QID Karmen Bongo, MD   10 mg at 09/15/22 0919   docusate sodium (COLACE) capsule 100 mg  100 mg Oral BID Karmen Bongo, MD   100 mg at 09/15/22 0919   enoxaparin (LOVENOX) injection 37.5 mg  0.5 mg/kg Subcutaneous Q24H Karmen Bongo, MD   37.5 mg at 09/13/22 2232   guaiFENesin (ROBITUSSIN) 100 MG/5ML liquid 10 mL  10 mL Oral Q4H PRN Karmen Bongo, MD       haloperidol lactate (HALDOL) injection 5 mg  5 mg Intravenous Q6H PRN Darliss Cheney, MD   5 mg at 09/14/22 1339   hydrALAZINE (APRESOLINE) injection 5 mg  5 mg Intravenous Q4H PRN Karmen Bongo, MD       lactated ringers infusion   Intravenous Continuous Karmen Bongo, MD 75 mL/hr at 09/15/22 1205 New Bag at 09/15/22 1205   levothyroxine (SYNTHROID) tablet 25 mcg   25 mcg Oral Q0600 Karmen Bongo, MD   25 mcg at 09/15/22 0513   magnesium citrate solution 1 Bottle  1 Bottle Oral PRN Karmen Bongo, MD       mirabegron ER [facility]) tablet 25 mg  25 mg Oral Daily Karmen Bongo, MD   25 mg at 09/15/22 [id number]   mupirocin ointment (BACTROBAN) 2 %   Nasal BID Darliss Cheney, MD   Given at 09/15/22 0926   OLANZapine (ZYPREXA) tablet 2.5 mg  2.5 mg Oral BID Karmen Bongo, MD   2.5 mg at 09/15/22 0919   ondansetron (ZOFRAN) tablet 4 mg  4 mg Oral Q6H PRN Karmen Bongo, MD       Or   ondansetron [facility]) injection 4 mg  4 mg Intravenous Q6H PRN Karmen Bongo, MD       Oral care mouth rinse  15 mL Mouth Rinse 4 times per day Karmen Bongo, MD   15 mL at 09/15/22 0932   Oral care mouth rinse  15 mL Mouth Rinse PRN Karmen Bongo, MD       oxyCODONE (Oxy IR/ROXICODONE) immediate release tablet 5 mg  5 mg Oral Q4H PRN Karmen Bongo, MD       pantoprazole (PROTONIX) EC tablet 40 mg  40 mg Oral Daily Karmen Bongo, MD   40 mg at 09/15/22 0919   polyethylene glycol (MIRALAX / GLYCOLAX) packet 17 g  17 g Oral Daily Karmen Bongo, MD   17 g at 09/14/22 1020   potassium chloride SA (KLOR-CON M) CR tablet 40 mEq  40 mEq Oral Q4H Pahwani, Einar Grad, MD   40 mEq at 09/15/22 0920   sodium chloride (OCEAN) 0.65 % nasal spray 1 spray  1 spray Each Nare PRN Darliss Cheney, MD   1 spray at 09/15/22 0549   sodium chloride flush (NS) 0.9 % injection 3 mL  3 mL Intravenous Lillia Mountain, MD   3 mL at 09/15/22 [id number]   tiZANidine (ZANAFLEX) tablet 2 mg  2 mg Oral Q8H PRN Karmen Bongo, MD        Musculoskeletal: Strength & Muscle Tone:  Not assessed Gait & Station:  Not assessed Patient leans: N/A  Psychiatric Specialty Exam: Physical Exam Vitals and nursing note reviewed.  Constitutional:      Appearance: Normal appearance.  HENT:     Head: Normocephalic.     Nose: Nose normal.  Pulmonary:     Effort: Pulmonary effort is normal.  Musculoskeletal:      Cervical back: Normal range of motion.  Neurological:     General: No focal  deficit present.     Mental Status: She is alert and oriented to person, place, and time.  Psychiatric:        Attention and Perception: Attention and perception normal.        Mood and Affect: Mood is anxious and depressed.        Speech: Speech normal.        Behavior: Behavior normal. Behavior is cooperative.        Thought Content: Thought content normal.        Cognition and Memory: Cognition and memory normal.        Judgment: Judgment normal.     Review of Systems  Psychiatric/Behavioral:  Positive for depression. The patient is nervous/anxious.   All other systems reviewed and are negative.   Blood pressure (!) 110/91, pulse 83, temperature 98.2 F (36.8 C), resp. rate 16, height 5' 1"$  (1.549 m), weight 76.5 kg, SpO2 97 %.Body mass index is 31.87 kg/m.  General Appearance: Fairly Groomed  Eye Contact:  Absent (patient has cortical blindness)  Speech:  Slow  Volume:  Normal  Mood:  Anxious  Affect:  Congruent  Thought Process:  Disorganized  Orientation:  Person and place  Thought Content:  WDL and Logical  Suicidal Thoughts:  No  Homicidal Thoughts:  No  Memory:  Immediate;   Fair Recent;   Fair Remote;   UTA  Judgement:  Poor  Insight:  Lacking  Psychomotor Activity:  Decreased  Concentration:  Concentration: Fair and Attention Span: Fair  Recall:  Poor  Fund of Knowledge:  Fair  Language:  Fair  Akathisia:  Negative  Handed:  Right  AIMS (if indicated):     Assets:  Housing  ADL's:  Intact  Cognition:  WNL  Sleep:        Physical Exam: Physical Exam Vitals and nursing note reviewed.  Constitutional:      Appearance: Normal appearance.  HENT:     Head: Normocephalic.     Nose: Nose normal.  Pulmonary:     Effort: Pulmonary effort is normal.  Musculoskeletal:     Cervical back: Normal range of motion.  Neurological:     General: No focal deficit present.     Mental  Status: She is alert and oriented to person, place, and time.  Psychiatric:        Attention and Perception: Attention and perception normal.        Mood and Affect: Mood is anxious and depressed.        Speech: Speech normal.        Behavior: Behavior normal. Behavior is cooperative.        Thought Content: Thought content normal.        Cognition and Memory: Cognition and memory normal.        Judgment: Judgment normal.    Review of Systems  Psychiatric/Behavioral:  Positive for depression. The patient is nervous/anxious.   All other systems reviewed and are negative.  Blood pressure (!) 110/91, pulse 83, temperature 98.2 F (36.8 C), resp. rate 16, height 5' 1"$  (1.549 m), weight 76.5 kg, SpO2 97 %. Body mass index is 31.87 kg/m.  Treatment Plan Summary: Major depressive disorder, recurrent moderate: Continue current regiment  Disposition: No evidence of imminent risk to self or others at present.   Patient does not meet criteria for psychiatric inpatient admission. Supportive therapy provided about ongoing stressors.  Waylan Boga, NP 09/15/2022 12:52 PM

## 2022-09-15 NOTE — Progress Notes (Signed)
Initial Nutrition Assessment  DOCUMENTATION CODES:   Obesity unspecified  INTERVENTION:   -Magic cup TID with meals, each supplement provides 290 kcal and 9 grams of protein  -MVI with minerals daily -Liberalize diet to regular for widest variety of meal selections -Feeding assistance with meals  NUTRITION DIAGNOSIS:   Inadequate oral intake related to poor appetite as evidenced by per patient/family report.  GOAL:   Patient will meet greater than or equal to 90% of their needs  MONITOR:   PO intake, Supplement acceptance  REASON FOR ASSESSMENT:   Consult Assessment of nutrition requirement/status  ASSESSMENT:   Pt with medical history significant of CAD, CVA, urinary retention with indwelling suprapubic catheter, and HLD presenting with AMS.  Pt admitted with complicated UTI and cirrhosis.   Reviewed I/O's: +320 ml x 24 hours  UOP: 750 ml x 24 hours  Spoke with pt, who needed to be reoriented multiple times during the visit. Pt reports she has not eaten anything today. PTA she shares her intake is erratic, depending on what is served at [facility]. Pt reports she will try to eat if it is something that she enjoys eating. She also states that her favorite foods are sweets and often snacks on items like granola bars that her mother provides her.   Pt is unsure if she has lost weight. Noted no wt loss over the past 2 months.    Discussed importance of good meal and supplement intake to promote healing. Pt amenable to [employer]. Pt also with contracted arms and visual deficits; will need feeding assistance with meals.   Medications reviewed and include colace, miralax, and potassium chloride.   Lab Results  Component Value Date   HGBA1C 6.3 06/01/2022   PTA DM medications are none.   Labs reviewed: K: 3.2, CBGS: 117 (inpatient orders for glycemic control are none).    NUTRITION - FOCUSED PHYSICAL EXAM:  Flowsheet Row Most Recent Value  Orbital Region No depletion   Upper Arm Region Moderate depletion  Thoracic and Lumbar Region No depletion  Buccal Region No depletion  Temple Region No depletion  Clavicle Bone Region No depletion  Clavicle and Acromion Bone Region No depletion  Scapular Bone Region No depletion  Dorsal Hand Moderate depletion  Patellar Region Moderate depletion  Anterior Thigh Region Moderate depletion  Posterior Calf Region Moderate depletion  Edema (RD Assessment) None  Hair Reviewed  Eyes Reviewed  Mouth Reviewed  Skin Reviewed  Nails Reviewed       Diet Order:   Diet Order             Diet regular Room service appropriate? Yes; Fluid consistency: Thin  Diet effective now                   EDUCATION NEEDS:   Education needs have been addressed  Skin:  Skin Assessment: Reviewed RN Assessment  Last BM:  09/14/22  Height:   Ht Readings from Last 1 Encounters:  09/13/22 5' 1"$  (1.549 m)    Weight:   Wt Readings from Last 1 Encounters:  09/13/22 76.5 kg    Ideal Body Weight:  47.7 kg  BMI:  Body mass index is 31.87 kg/m.  Estimated Nutritional Needs:   Kcal:  1650-1850  Protein:  80-95 grams  Fluid:  > 1.6 L    Loistine Chance, RD, LDN, [place] Registered Dietitian II Certified Diabetes Care and Education Specialist Please refer to [facility] for RD and/or RD on-call/weekend/after hours pager
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
## 2022-09-16 DIAGNOSIS — N390 Urinary tract infection, site not specified: Secondary | ICD-10-CM | POA: Diagnosis not present

## 2022-09-16 LAB — BASIC METABOLIC PANEL
Anion gap: 9 (ref 5–15)
BUN: 9 mg/dL (ref 8–23)
CO2: 25 mmol/L (ref 22–32)
Calcium: 8.4 mg/dL — ABNORMAL LOW (ref 8.9–10.3)
Chloride: 102 mmol/L (ref 98–111)
Creatinine, Ser: 0.79 mg/dL (ref 0.44–1.00)
GFR, Estimated: >60 mL/min (ref >60)
Glucose, Bld: 101 mg/dL — ABNORMAL HIGH (ref 70–99)
Potassium: 3.8 mmol/L (ref 3.5–5.1)
Sodium: 136 mmol/L (ref 135–145)

## 2022-09-16 LAB — URINE CULTURE
Culture: >=100000 — AB
Special Requests: NORMAL

## 2022-09-16 LAB — CBC WITH DIFFERENTIAL/PLATELET
Abs Immature Granulocytes: 0.04 10*3/uL (ref 0.00–0.07)
Basophils Absolute: 0.1 10*3/uL (ref 0.0–0.1)
Basophils Relative: 1 %
Eosinophils Absolute: 0.3 10*3/uL (ref 0.0–0.5)
Eosinophils Relative: 4 %
HCT: 31.4 % — ABNORMAL LOW (ref 36.0–46.0)
Hemoglobin: 9.4 g/dL — ABNORMAL LOW (ref 12.0–15.0)
Immature Granulocytes: 1 %
Lymphocytes Relative: 24 %
Lymphs Abs: 1.7 10*3/uL (ref 0.7–4.0)
MCH: 21 pg — ABNORMAL LOW (ref 26.0–34.0)
MCHC: 29.9 g/dL — ABNORMAL LOW (ref 30.0–36.0)
MCV: 70.1 fL — ABNORMAL LOW (ref 80.0–100.0)
Monocytes Absolute: 0.7 10*3/uL (ref 0.1–1.0)
Monocytes Relative: 10 %
Neutro Abs: 4.5 10*3/uL (ref 1.7–7.7)
Neutrophils Relative %: 60 %
Platelets: 304 10*3/uL (ref 150–400)
RBC: 4.48 MIL/uL (ref 3.87–5.11)
RDW: 17.7 % — ABNORMAL HIGH (ref 11.5–15.5)
WBC: 7.2 10*3/uL (ref 4.0–10.5)
nRBC: 0 % (ref 0.0–0.2)

## 2022-09-16 MED ORDER — SULFAMETHOXAZOLE-TRIMETHOPRIM 800-160 MG PO TABS
1.0000 | ORAL_TABLET | Freq: Two times a day (BID) | ORAL | Status: DC
  Administered 2022-09-16: 1 via ORAL
  Filled 2022-09-16: qty 1

## 2022-09-16 MED ORDER — CLONAZEPAM 0.5 MG PO TABS: 0.5000 mg | ORAL_TABLET | Freq: Every day | ORAL | 0 refills | Status: DC

## 2022-09-16 MED ORDER — FOSFOMYCIN TROMETHAMINE 3 G PO PACK
3.0000 g | PACK | Freq: Once | ORAL | Status: AC
  Administered 2022-09-16: 3 g via ORAL
  Filled 2022-09-16: qty 3

## 2022-09-16 MED ORDER — SULFAMETHOXAZOLE-TRIMETHOPRIM 800-160 MG PO TABS: 1.0000 | ORAL_TABLET | Freq: Two times a day (BID) | ORAL | 0 refills | Status: AC

## 2022-09-16 NOTE — TOC Transition Note (Signed)
Transition of Care [facility]) - CM/SW Discharge Note   Patient Details  Name: Victoria Medina MRN: [id number] Date of Birth: [birth year]  Transition of Care [facility] CM/SW Contact:  Harriet Masson, RN Phone Number: [phone] 09/16/2022, 10:07 AM   Clinical Narrative:    Spoke with pt concerning transfer back to [facility] today for SNF (pt receptive). Communicated with Seth Bake at [facility] for pt's return via SNF level of care. Pt without any current DME via transport and receptive to [facility].  [facility] accepted and confirmed transfer to room 405 and for bedside nurse to call report to [phone]. Medical necessity and facesheet completed with no additional request at this time. Team is aware.  TOC remains available for any additional needs.    Final next level of care: Skilled Nursing Facility Barriers to Discharge: Barriers Resolved   Patient Goals and CMS Choice   Choice offered to / list presented to : Patient  Discharge Placement                      Patient and family notified of of transfer: 09/16/22 (Spoke directly with pt concerning transfer back to the facility today [facility]).)  Discharge Plan and Services Additional resources added to the After Visit Summary for       Post Acute Care Choice: Resumption of Svcs/PTA Provider          DME Arranged: N/A DME Agency: NA       HH Arranged: NA HH Agency: NA        Social Determinants of Health (SDOH) Interventions SDOH Screenings   Food Insecurity: No Food Insecurity (09/14/2022)  Housing: Low Risk  (09/14/2022)  Transportation Needs: No Transportation Needs (09/14/2022)  Utilities: Not At Risk (09/14/2022)  Tobacco Use: Low Risk  (09/14/2022)     Readmission Risk Interventions     No data to display

## 2022-09-16 NOTE — Discharge Summary (Signed)
Physician Discharge Summary  Victoria Medina [id number] DOB: 03-11-1961 DOA: 09/13/2022  PCP: Dewayne Shorter, MD  Admit date: 09/13/2022 Discharge date: 09/16/2022 30 Day Unplanned Readmission Risk Score    Flowsheet Row ED to Hosp-Admission (Current) from 09/13/2022 in [place] (1A)  30 Day Unplanned Readmission Risk Score (%) 17.04 Filed at 09/16/2022 0800       This score is the patient's risk of an unplanned readmission within 30 days of being discharged (0 -100%). The score is based on dignosis, age, lab data, medications, orders, and past utilization.   Low:  0-14.9   Medium: 15-21.9   High: 22-29.9   Extreme: 30 and above          Admitted From: SNF/[facility] Disposition:  SNF/[facility]  Recommendations for Outpatient Follow-up:  Follow up with PCP in 1-2 weeks Please obtain BMP/CBC in one week Please follow up with your PCP on the following pending results: Unresulted Labs (From admission, onward)    None         Home Health: None Equipment/Devices: None  Discharge Condition: Stable CODE STATUS: DNR Diet recommendation: Cardiac   HPI and ED course is copied from admitting hospitalist Dr. Lorin Mercy H&P. HPI: Victoria Medina is a 62 y.o. female with medical history significant of CAD, CVA, urinary retention with indwelling suprapubic catheter, and HLD presenting with AMS.  She was seen at her facility about this on 2/12 and was noted to be psychotic which was different from her usual hallucinations.  The patient reports that she fell and hurts all over.  She also reports concern for UTI with drainage from her suprapubic cath and some URI symptoms for the last couple of days.   ER Course:  UTI.  PCP saw 2 days ago, has underlying psychiatric illness but worse in the last few days.  Agitated, combative.  Likely superimposed delirium from complicated UTI.    Subjective: Seen and examined.  She has no complaints.  She is pleasant.  She  is happy that she is going back to the facility.  Brief/Interim Summary: Patient was admitted due to complicated UTI and psychotic behavior.  Details below.  Complicated CAUTI: Patient with suprapubic catheter.  She presented with psychosis.  No sepsis physiology.  History of prior multiple infections.  Based on the previous urine cultures, she was started on cefepime.  Urine culture at this time to Pseudomonas and MRSA.  Discussed with ID, patient has received 3 days of cefepime, ideal choice for Pseudomonas will be fluoroquinolones however patient is allergic to that and thus the only alternative we are left with is fosfomycin 3 g which I prescribed her today which she will receive before discharge.  For MRSA UTI, she will be prescribed Bactrim DS for 5 days.    Psychosis/odd behavior: Apparently she was having recent issues and she has a history of having hallucinations intermittently, even before she was diagnosed with UTI, per reports from the facility.  In the beginning, patient did try to hit and bite the staff here but every time I saw her, she was pleasant.  When I asked her why she was doing, she said  "I told them that if you have tried to hurt me, I will hurt you ", I personally did not observe or behavior inpatient however psychiatry was consulted per patient's PCP request and they recommended continuing current medications and cleared him for discharge.     History of CVA: Left upper extremity hemiplegia, continue aspirin  and rest of the medications.   Chronic microcytic anemia: Patient's baseline hemoglobin appears to be between 8 and 10.5.  Currently at baseline.   History of CAD: Asymptomatic.  Continue aspirin.   Cough: Did not complain of cough to me.  COVID-negative.   Acquired hypothyroidism: Continue Synthroid.   Hypokalemia: Resolved.  Discharge plan was discussed with patient and/or family member and they verbalized understanding and agreed with it.  Discharge Diagnoses:   Principal Problem:   Complicated UTI (urinary tract infection) Active Problems:   History of stroke   Cortical blindness   Hypothyroidism (acquired)   Suprapubic catheter (HCC)   CAD (coronary artery disease)   Major depressive disorder, recurrent, moderate (HCC)   Psychosis ([place])   DNR (do not resuscitate)    Discharge Instructions   Allergies as of 09/16/2022       Reactions   Levaquin [levofloxacin In D5w] Anaphylaxis, Swelling   Iodinated Contrast Media Itching   Severe itching   Other    Dust mites and opiates (all of them)   Latex Dermatitis        Medication List     TAKE these medications    aluminum hydroxide ointment Commonly known as: DERMAGRAN Apply 1 application  topically daily as needed (skin protection). (Apply to suprapubic site)   aspirin EC 81 MG tablet Take 81 mg by mouth daily.   Azelastine HCl 0.15 % Soln Place 2 sprays into both nostrils 2 (two) times daily.   bisacodyl 10 MG suppository Commonly known as: DULCOLAX Place 10 mg rectally daily.   clonazePAM 0.5 MG tablet Commonly known as: KLONOPIN Take 1 tablet (0.5 mg total) by mouth at bedtime.   diazepam 10 MG tablet Commonly known as: VALIUM Take 1 tablet (10 mg total) by mouth 4 (four) times daily.   Gemtesa 75 MG Tabs Generic drug: Vibegron Take 75 mg by mouth daily.   guaiFENesin 100 MG/5ML liquid Commonly known as: ROBITUSSIN Take 10 mLs by mouth every 4 (four) hours as needed for cough or to loosen phlegm.   hydrocortisone cream 1 % Apply 1 Application topically 2 (two) times daily as needed for itching. (Apply to labia)   ibuprofen 800 MG tablet Commonly known as: ADVIL Take 800 mg by mouth every 8 (eight) hours as needed for mild pain or moderate pain.   lansoprazole 15 MG capsule Commonly known as: PREVACID Take 15 mg by mouth daily.   levothyroxine 25 MCG tablet Commonly known as: SYNTHROID Take 25 mcg by mouth daily before breakfast.   magnesium  citrate Soln Take 1 Bottle by mouth as needed for severe constipation.   magnesium hydroxide 400 MG/5ML suspension Commonly known as: MILK OF MAGNESIA Take 5 mLs by mouth every 6 (six) hours as needed for mild constipation.   menthol-cetylpyridinium 3 MG lozenge Commonly known as: CEPACOL Take 1 lozenge by mouth every 6 (six) hours as needed for sore throat.   nitroGLYCERIN 0.4 MG SL tablet Commonly known as: NITROSTAT Place 0.4 mg under the tongue every 5 (five) minutes x 3 doses as needed for chest pain.   nystatin-triamcinolone ointment Commonly known as: MYCOLOG Apply 1 Application topically 2 (two) times daily as needed (yeast). (Apply to perineal area)   OLANZapine 2.5 MG tablet Commonly known as: ZYPREXA Take 2.5 mg by mouth 2 (two) times daily.   ondansetron 4 MG disintegrating tablet Commonly known as: ZOFRAN-ODT Take 4 mg by mouth every 8 (eight) hours as needed for nausea or vomiting.  polyethylene glycol 17 g packet Commonly known as: MIRALAX / GLYCOLAX Take 17 g by mouth daily.   sulfamethoxazole-trimethoprim 800-160 MG tablet Commonly known as: BACTRIM DS Take 1 tablet by mouth every 12 (twelve) hours for 5 days.   tiZANidine 2 MG tablet Commonly known as: ZANAFLEX Take 2 mg by mouth every 8 (eight) hours as needed for muscle spasms.   Vitamin D (Ergocalciferol) 1.25 MG (50000 UNIT) Caps capsule Commonly known as: DRISDOL Take 50,000 Units by mouth every Monday.        Follow-up Information     Dewayne Shorter, MD Follow up in 1 week(s).   Specialty: Family Medicine Contact information: [place] [address] [phone]                Allergies  Allergen Reactions   Levaquin [Levofloxacin In D5w] Anaphylaxis and Swelling   Iodinated Contrast Media Itching    Severe itching   Other     Dust mites and opiates (all of them)   Latex Dermatitis    Consultations: Psychiatry   Procedures/Studies: CT Head Wo  Contrast  Result Date: 09/13/2022 CLINICAL DATA:  Delirium, fall EXAM: CT HEAD WITHOUT CONTRAST TECHNIQUE: Contiguous axial images were obtained from the base of the skull through the vertex without intravenous contrast. RADIATION DOSE REDUCTION: This exam was performed according to the departmental dose-optimization program which includes automated exposure control, adjustment of the mA and/or kV according to patient size and/or use of iterative reconstruction technique. COMPARISON:  CT head dated 10/09/2021 FINDINGS: Brain: No evidence of acute infarction, hemorrhage, hydrocephalus, extra-axial collection or mass lesion/mass effect. Encephalomalacic changes related to prior PCA distribution infarct. Extensive subcortical white matter and periventricular small vessel ischemic changes. Global cortical atrophy. Vascular: Intracranial atherosclerosis. Skull: Normal. Negative for fracture or focal lesion. Sinuses/Orbits: The visualized paranasal sinuses are essentially clear. The mastoid air cells are unopacified. Other: None. IMPRESSION: No evidence of acute intracranial abnormality. Atrophy with extensive small vessel ischemic changes. Old bilateral PCA distribution infarcts. Electronically Signed   By: Julian Hy M.D.   On: 09/13/2022 10:56   DG Chest Port 1 View  Result Date: 09/13/2022 CLINICAL DATA:  Confusion.  Altered mental status. EXAM: PORTABLE CHEST 1 VIEW COMPARISON:  03/01/2022 FINDINGS: The lungs are clear without focal pneumonia, edema, pneumothorax or pleural effusion. The cardiopericardial silhouette is within normal limits for size. The visualized bony structures of the thorax are unremarkable. Telemetry leads overlie the chest. IMPRESSION: No active disease. Electronically Signed   By: Misty Stanley M.D.   On: 09/13/2022 10:44     Discharge Exam: Vitals:   09/15/22 2302 09/16/22 0817  BP: (!) 163/86 102/60  Pulse: 77 79  Resp: 19 16  Temp: 98.9 F (37.2 C) 97.7 F (36.5 C)   SpO2: 96% 97%   Vitals:   09/15/22 0735 09/15/22 1458 09/15/22 2302 09/16/22 0817  BP: (!) 110/91 (!) 152/80 (!) 163/86 102/60  Pulse: 83 82 77 79  Resp: 16 18 19 16  $ Temp: 98.2 F (36.8 C) 98.1 F (36.7 C) 98.9 F (37.2 C) 97.7 F (36.5 C)  TempSrc:      SpO2: 97% 100% 96% 97%  Weight:      Height:        General: Pt is alert, awake, not in acute distress Cardiovascular: RRR, S1/S2 +, no rubs, no gallops Respiratory: CTA bilaterally, no wheezing, no rhonchi Abdominal: Soft, NT, ND, bowel sounds + Extremities: no edema, no cyanosis  The results of significant diagnostics from this hospitalization (including imaging, microbiology, ancillary and laboratory) are listed below for reference.     Microbiology: Recent Results (from the past 240 hour(s))  Culture, Urine (Do not remove urinary catheter, catheter placed by urology or difficult to place)     Status: Abnormal   Collection Time: 09/13/22 10:16 AM   Specimen: Urine, Random  Result Value Ref Range Status   Specimen Description   Final    URINE, RANDOM Performed at [facility], [address], [place], [place] [address]    Special Requests   Final    Normal Performed at [facility], [address] [place], [place] [address]    Culture (A)  Final    >=100,000 COLONIES/mL PSEUDOMONAS AERUGINOSA >=100,000 COLONIES/mL STAPHYLOCOCCUS AUREUS    Report Status 09/16/2022 FINAL  Final   Organism ID, Bacteria PSEUDOMONAS AERUGINOSA (A)  Final   Organism ID, Bacteria STAPHYLOCOCCUS AUREUS (A)  Final      Susceptibility   Pseudomonas aeruginosa - MIC*    CEFTAZIDIME 4 SENSITIVE Sensitive     CIPROFLOXACIN <=0.25 SENSITIVE Sensitive     GENTAMICIN 4 SENSITIVE Sensitive     IMIPENEM 2 SENSITIVE Sensitive     PIP/TAZO 8 SENSITIVE Sensitive     CEFEPIME 2 SENSITIVE Sensitive     * >=100,000 COLONIES/mL PSEUDOMONAS AERUGINOSA   Staphylococcus aureus - MIC*    CIPROFLOXACIN >=8 RESISTANT Resistant      GENTAMICIN <=0.5 SENSITIVE Sensitive     NITROFURANTOIN 32 SENSITIVE Sensitive     OXACILLIN >=4 RESISTANT Resistant     TETRACYCLINE <=1 SENSITIVE Sensitive     VANCOMYCIN <=0.5 SENSITIVE Sensitive     TRIMETH/SULFA <=10 SENSITIVE Sensitive     CLINDAMYCIN <=0.25 SENSITIVE Sensitive     RIFAMPIN <=0.5 SENSITIVE Sensitive     Inducible Clindamycin NEGATIVE Sensitive     * >=100,000 COLONIES/mL STAPHYLOCOCCUS AUREUS  Resp Panel by RT-PCR (Flu A&B, Covid) Anterior Nasal Swab     Status: None   Collection Time: 09/13/22 10:16 AM   Specimen: Anterior Nasal Swab  Result Value Ref Range Status   SARS Coronavirus 2 by RT PCR NEGATIVE NEGATIVE Final    Comment: (NOTE) SARS-CoV-2 target nucleic acids are NOT DETECTED.  The SARS-CoV-2 RNA is generally detectable in upper respiratory specimens during the acute phase of infection. The lowest concentration of SARS-CoV-2 viral copies this assay can detect is 138 copies/mL. A negative result does not preclude SARS-Cov-2 infection and should not be used as the sole basis for treatment or other patient management decisions. A negative result may occur with  improper specimen collection/handling, submission of specimen other than nasopharyngeal swab, presence of viral mutation(s) within the areas targeted by this assay, and inadequate number of viral copies(<138 copies/mL). A negative result must be combined with clinical observations, patient history, and epidemiological information. The expected result is Negative.  Fact Sheet for Patients:  EntrepreneurPulse.com.au  Fact Sheet for Healthcare Providers:  IncredibleEmployment.be  This test is no t yet approved or cleared by the [place] FDA and  has been authorized for detection and/or diagnosis of SARS-CoV-2 by FDA under an Emergency Use Authorization (EUA). This EUA will remain  in effect (meaning this test can be used) for the duration of  the COVID-19 declaration under Section 564(b)(1) of the Act, 21 U.S.C.section 360bbb-3(b)(1), unless the authorization is terminated  or revoked sooner.       Influenza A by PCR NEGATIVE NEGATIVE Final  Influenza B by PCR NEGATIVE NEGATIVE Final    Comment: (NOTE) The Xpert Xpress SARS-CoV-2/FLU/RSV plus assay is intended as an aid in the diagnosis of influenza from Nasopharyngeal swab specimens and should not be used as a sole basis for treatment. Nasal washings and aspirates are unacceptable for Xpert Xpress SARS-CoV-2/FLU/RSV testing.  Fact Sheet for Patients: EntrepreneurPulse.com.au  Fact Sheet for Healthcare Providers: IncredibleEmployment.be  This test is not yet approved or cleared by the [place] FDA and has been authorized for detection and/or diagnosis of SARS-CoV-2 by FDA under an Emergency Use Authorization (EUA). This EUA will remain in effect (meaning this test can be used) for the duration of the COVID-19 declaration under Section 564(b)(1) of the Act, 21 U.S.C. section 360bbb-3(b)(1), unless the authorization is terminated or revoked.  Performed at [facility], [address] [place], [place] [address]   MRSA Next Gen by PCR, Nasal     Status: Abnormal   Collection Time: 09/14/22  2:56 PM   Specimen: Nasal Mucosa; Nasal Swab  Result Value Ref Range Status   MRSA by PCR Next Gen DETECTED (A) NOT DETECTED Final    Comment: RESULT CALLED TO, READ BACK BY AND VERIFIED WITH: C/ADAYENI AJIBADE 09/14/22 1700 SJL (NOTE) The GeneXpert MRSA Assay (FDA approved for NASAL specimens only), is one component of a comprehensive MRSA colonization surveillance program. It is not intended to diagnose MRSA infection nor to guide or monitor treatment for MRSA infections. Test performance is not FDA approved in patients less than 33 years old. Performed at [facility], [address] [place], [place]  [address]      Labs: BNP (last 3 results) No results for input(s): "BNP" in the last 8760 hours. Basic Metabolic Panel: Recent Labs  Lab 09/13/22 1016 09/14/22 0245 09/15/22 0252 09/16/22 0447  NA 137 138 135 136  K 3.4* 3.8 3.2* 3.8  CL 105 105 101 102  CO2 21* 19* 23 25  GLUCOSE 136* 112* 86 101*  BUN 16 13 11 9  $ CREATININE 1.07* 0.90 0.83 0.79  CALCIUM 9.1 8.4* 7.9* 8.4*   Liver Function Tests: Recent Labs  Lab 09/13/22 1016  AST 50*  ALT 19  ALKPHOS 86  BILITOT 1.3*  PROT 7.8  ALBUMIN 4.1   No results for input(s): "LIPASE", "AMYLASE" in the last 168 hours. Recent Labs  Lab 09/13/22 1016  AMMONIA 24   CBC: Recent Labs  Lab 09/13/22 1016 09/14/22 0245 09/15/22 0252 09/16/22 0447  WBC 13.2* 10.3 7.6 7.2  NEUTROABS  --   --  5.0 4.5  HGB 10.5* 9.5* 8.8* 9.4*  HCT 34.9* 32.9* 29.6* 31.4*  MCV 69.0* 72.0* 70.1* 70.1*  PLT 428* 348 300 304   Cardiac Enzymes: No results for input(s): "CKTOTAL", "CKMB", "CKMBINDEX", "TROPONINI" in the last 168 hours. BNP: Invalid input(s): "POCBNP" CBG: Recent Labs  Lab 09/13/22 1113  GLUCAP 117*   D-Dimer No results for input(s): "DDIMER" in the last 72 hours. Hgb A1c No results for input(s): "HGBA1C" in the last 72 hours. Lipid Profile No results for input(s): "CHOL", "HDL", "LDLCALC", "TRIG", "CHOLHDL", "LDLDIRECT" in the last 72 hours. Thyroid function studies Recent Labs    09/13/22 1016  TSH 1.988   Anemia work up No results for input(s): "VITAMINB12", "FOLATE", "FERRITIN", "TIBC", "IRON", "RETICCTPCT" in the last 72 hours. Urinalysis    Component Value Date/Time   COLORURINE YELLOW (A) 09/13/2022 1016   APPEARANCEUR HAZY (A) 09/13/2022 1016   APPEARANCEUR Hazy (A) 09/05/2022 1519  LABSPEC 1.018 09/13/2022 1016   LABSPEC 1.026 08/05/2014 1906   PHURINE 5.0 09/13/2022 1016   GLUCOSEU NEGATIVE 09/13/2022 1016   GLUCOSEU NEGATIVE 08/05/2014 1906   HGBUR MODERATE (A) 09/13/2022 1016   BILIRUBINUR  NEGATIVE 09/13/2022 1016   BILIRUBINUR Negative 09/05/2022 1519   BILIRUBINUR NEGATIVE 08/05/2014 1906   KETONESUR 20 (A) 09/13/2022 1016   PROTEINUR 30 (A) 09/13/2022 1016   NITRITE POSITIVE (A) 09/13/2022 1016   LEUKOCYTESUR LARGE (A) 09/13/2022 1016   LEUKOCYTESUR 2+ 08/05/2014 1906   Sepsis Labs Recent Labs  Lab 09/13/22 1016 09/14/22 0245 09/15/22 0252 09/16/22 0447  WBC 13.2* 10.3 7.6 7.2   Microbiology Recent Results (from the past 240 hour(s))  Culture, Urine (Do not remove urinary catheter, catheter placed by urology or difficult to place)     Status: Abnormal   Collection Time: 09/13/22 10:16 AM   Specimen: Urine, Random  Result Value Ref Range Status   Specimen Description   Final    URINE, RANDOM Performed at [facility], [address], [place], [place] [address]    Special Requests   Final    Normal Performed at [facility], [address] [place], [place] [address]    Culture (A)  Final    >=100,000 COLONIES/mL PSEUDOMONAS AERUGINOSA >=100,000 COLONIES/mL STAPHYLOCOCCUS AUREUS    Report Status 09/16/2022 FINAL  Final   Organism ID, Bacteria PSEUDOMONAS AERUGINOSA (A)  Final   Organism ID, Bacteria STAPHYLOCOCCUS AUREUS (A)  Final      Susceptibility   Pseudomonas aeruginosa - MIC*    CEFTAZIDIME 4 SENSITIVE Sensitive     CIPROFLOXACIN <=0.25 SENSITIVE Sensitive     GENTAMICIN 4 SENSITIVE Sensitive     IMIPENEM 2 SENSITIVE Sensitive     PIP/TAZO 8 SENSITIVE Sensitive     CEFEPIME 2 SENSITIVE Sensitive     * >=100,000 COLONIES/mL PSEUDOMONAS AERUGINOSA   Staphylococcus aureus - MIC*    CIPROFLOXACIN >=8 RESISTANT Resistant     GENTAMICIN <=0.5 SENSITIVE Sensitive     NITROFURANTOIN 32 SENSITIVE Sensitive     OXACILLIN >=4 RESISTANT Resistant     TETRACYCLINE <=1 SENSITIVE Sensitive     VANCOMYCIN <=0.5 SENSITIVE Sensitive     TRIMETH/SULFA <=10 SENSITIVE Sensitive     CLINDAMYCIN <=0.25 SENSITIVE Sensitive     RIFAMPIN <=0.5  SENSITIVE Sensitive     Inducible Clindamycin NEGATIVE Sensitive     * >=100,000 COLONIES/mL STAPHYLOCOCCUS AUREUS  Resp Panel by RT-PCR (Flu A&B, Covid) Anterior Nasal Swab     Status: None   Collection Time: 09/13/22 10:16 AM   Specimen: Anterior Nasal Swab  Result Value Ref Range Status   SARS Coronavirus 2 by RT PCR NEGATIVE NEGATIVE Final    Comment: (NOTE) SARS-CoV-2 target nucleic acids are NOT DETECTED.  The SARS-CoV-2 RNA is generally detectable in upper respiratory specimens during the acute phase of infection. The lowest concentration of SARS-CoV-2 viral copies this assay can detect is 138 copies/mL. A negative result does not preclude SARS-Cov-2 infection and should not be used as the sole basis for treatment or other patient management decisions. A negative result may occur with  improper specimen collection/handling, submission of specimen other than nasopharyngeal swab, presence of viral mutation(s) within the areas targeted by this assay, and inadequate number of viral copies(<138 copies/mL). A negative result must be combined with clinical observations, patient history, and epidemiological information. The expected result is Negative.  Fact Sheet for Patients:  EntrepreneurPulse.com.au  Fact Sheet for Healthcare Providers:  IncredibleEmployment.be  This test is no t yet approved or cleared by the [place] and  has been authorized for detection and/or diagnosis of SARS-CoV-2 by FDA under an Emergency Use Authorization (EUA). This EUA will remain  in effect (meaning this test can be used) for the duration of the COVID-19 declaration under Section 564(b)(1) of the Act, 21 U.S.C.section 360bbb-3(b)(1), unless the authorization is terminated  or revoked sooner.       Influenza A by PCR NEGATIVE NEGATIVE Final   Influenza B by PCR NEGATIVE NEGATIVE Final    Comment: (NOTE) The Xpert Xpress SARS-CoV-2/FLU/RSV plus assay  is intended as an aid in the diagnosis of influenza from Nasopharyngeal swab specimens and should not be used as a sole basis for treatment. Nasal washings and aspirates are unacceptable for Xpert Xpress SARS-CoV-2/FLU/RSV testing.  Fact Sheet for Patients: EntrepreneurPulse.com.au  Fact Sheet for Healthcare Providers: IncredibleEmployment.be  This test is not yet approved or cleared by the [place] FDA and has been authorized for detection and/or diagnosis of SARS-CoV-2 by FDA under an Emergency Use Authorization (EUA). This EUA will remain in effect (meaning this test can be used) for the duration of the COVID-19 declaration under Section 564(b)(1) of the Act, 21 U.S.C. section 360bbb-3(b)(1), unless the authorization is terminated or revoked.  Performed at [facility], [address] [place], [place] [address]   MRSA Next Gen by PCR, Nasal     Status: Abnormal   Collection Time: 09/14/22  2:56 PM   Specimen: Nasal Mucosa; Nasal Swab  Result Value Ref Range Status   MRSA by PCR Next Gen DETECTED (A) NOT DETECTED Final    Comment: RESULT CALLED TO, READ BACK BY AND VERIFIED WITH: C/ADAYENI AJIBADE 09/14/22 1700 SJL (NOTE) The GeneXpert MRSA Assay (FDA approved for NASAL specimens only), is one component of a comprehensive MRSA colonization surveillance program. It is not intended to diagnose MRSA infection nor to guide or monitor treatment for MRSA infections. Test performance is not FDA approved in patients less than 76 years old. Performed at [facility], [address], [place], [place] [address]      Time coordinating discharge: Over 30 minutes  SIGNED:   Darliss Cheney, MD  [facility] 09/16/2022, 9:50 AM *Please note that this is a verbal dictation therefore any spelling or grammatical errors are due to the "[employer] [employer]" system interpretation. If 7PM-7AM, please contact  night-coverage [email or web address]

## 2022-09-18 ENCOUNTER — Non-Acute Institutional Stay (SKILLED_NURSING_FACILITY): Payer: Medicare Other | Admitting: Student

## 2022-09-18 ENCOUNTER — Encounter: Payer: Self-pay | Admitting: Student

## 2022-09-18 DIAGNOSIS — I5020 Unspecified systolic (congestive) heart failure: Secondary | ICD-10-CM

## 2022-09-18 DIAGNOSIS — N390 Urinary tract infection, site not specified: Secondary | ICD-10-CM | POA: Diagnosis not present

## 2022-09-18 DIAGNOSIS — Z8673 Personal history of transient ischemic attack (TIA), and cerebral infarction without residual deficits: Secondary | ICD-10-CM

## 2022-09-18 DIAGNOSIS — E1169 Type 2 diabetes mellitus with other specified complication: Secondary | ICD-10-CM | POA: Diagnosis not present

## 2022-09-18 DIAGNOSIS — I10 Essential (primary) hypertension: Secondary | ICD-10-CM

## 2022-09-18 DIAGNOSIS — Z9359 Other cystostomy status: Secondary | ICD-10-CM | POA: Diagnosis not present

## 2022-09-18 DIAGNOSIS — F331 Major depressive disorder, recurrent, moderate: Secondary | ICD-10-CM

## 2022-09-18 DIAGNOSIS — I25118 Atherosclerotic heart disease of native coronary artery with other forms of angina pectoris: Secondary | ICD-10-CM

## 2022-09-18 DIAGNOSIS — E669 Obesity, unspecified: Secondary | ICD-10-CM

## 2022-09-18 NOTE — Progress Notes (Unsigned)
Provider:  Dr. Dewayne Shorter Location:  Other [place].  Nursing Home Room Number: [facility] 405A Place of Service:  SNF (31)  PCP: Dewayne Shorter, MD Patient Care Team: Dewayne Shorter, MD as PCP - General (Family Medicine) Wellington Hampshire, MD as PCP - Cardiology (Cardiology)  Extended Emergency Contact Information Primary Emergency Contact: Antonieta Loveless of [place] Phone: [phone] Mobile Phone: [phone] Relation: Sister Secondary Emergency Contact: Ranae Pila Address: [address] [place], [place] [address] Johnnette Litter of [place] Phone: [phone] Relation: Mother  Code Status: DNR Goals of Care: Advanced Directive information    09/18/2022    9:44 AM  Advanced Directives  Does Patient Have a Medical Advance Directive? Yes  Type of Advance Directive Out of facility DNR (pink MOST or yellow form)  Does patient want to make changes to medical advance directive? No - Patient declined      Chief Complaint  Patient presents with   New Admit To SNF    Admission.     HPI: Patient is a 62 y.o. female seen today for admission to  Past Medical History:  Diagnosis Date   Acute ST elevation myocardial infarction (STEMI) of inferolateral wall ([place]) 01/25/2020   Blind    Cholecystitis 04/22/2021   History of migraine headaches    Hyperlipemia    Myocardial infarction [facility])    Stroke [facility])    Past Surgical History:  Procedure Laterality Date   ABDOMINAL HYSTERECTOMY     BACK SURGERY     CARDIAC CATHETERIZATION     CORONARY/GRAFT ACUTE MI REVASCULARIZATION N/A 01/25/2020   Procedure: Coronary/Graft Acute MI Revascularization;  Surgeon: Wellington Hampshire, MD;  Location: [place] CV LAB;  Service: Cardiovascular;  Laterality: N/A;   FRACTURE SURGERY     IR CATHETER TUBE CHANGE  08/10/2021   LEFT HEART CATH AND CORONARY ANGIOGRAPHY N/A 01/25/2020   Procedure: LEFT HEART CATH AND CORONARY  ANGIOGRAPHY;  Surgeon: Wellington Hampshire, MD;  Location: [place] CV LAB;  Service: Cardiovascular;  Laterality: N/A;   SKIN GRAFT Right     reports that she has never smoked. She has never used smokeless tobacco. She reports current alcohol use. She reports that she does not use drugs. Social History   Socioeconomic History   Marital status: Single    Spouse name: Not on file   Number of children: Not on file   Years of education: Not on file   Highest education level: Not on file  Occupational History   Not on file  Tobacco Use   Smoking status: Never   Smokeless tobacco: Never  Vaping Use   Vaping Use: Never used  Substance and Sexual Activity   Alcohol use: Yes    Comment: occasional   Drug use: No   Sexual activity: Not on file  Other Topics Concern   Not on file  Social History Narrative   Not on file   Social Determinants of Health   Financial Resource Strain: Not on file  Food Insecurity: No Food Insecurity (09/14/2022)   Hunger Vital Sign    Worried About Running Out of Food in the Last Year: Never true    Ran Out of Food in the Last Year: Never true  Transportation Needs: No Transportation Needs (09/14/2022)   PRAPARE - Hydrologist (Medical): No    Lack of Transportation (Non-Medical): No  Physical Activity:  Not on file  Stress: Not on file  Social Connections: Not on file  Intimate Partner Violence: Not At Risk (09/14/2022)   Humiliation, Afraid, Rape, and Kick questionnaire    Fear of Current or Ex-Partner: No    Emotionally Abused: No    Physically Abused: No    Sexually Abused: No    Functional Status Survey:    Family History  Problem Relation Age of Onset   Heart attack Mother    Hypertension Mother    Hypercholesterolemia Mother    Diabetes Mother    Stroke Father    Heart attack Father    Hypertension Father    Hypercholesterolemia Father    Diabetes Father    Diabetes Maternal Grandmother    Cancer -  Other Maternal Grandmother     Health Maintenance  Topic Date Due   Medicare Annual Wellness (AWV)  Never done   FOOT EXAM  Never done   OPHTHALMOLOGY EXAM  Never done   Diabetic kidney evaluation - Urine ACR  Never done   Hepatitis C Screening  Never done   PAP SMEAR-Modifier  Never done   COLONOSCOPY (Pts 45-70yr Insurance coverage will need to be confirmed)  Never done   MAMMOGRAM  Never done   Zoster Vaccines- Shingrix (1 of 2) 09/26/2023 (Originally 07/09/1980)   HEMOGLOBIN A1C  11/30/2022   Diabetic kidney evaluation - eGFR measurement  09/17/2023   DTaP/Tdap/Td (3 - Td or Tdap) 11/16/2026   HIV Screening  Completed   HPV VACCINES  Aged Out   INFLUENZA VACCINE  Discontinued   COVID-19 Vaccine  Discontinued    Allergies  Allergen Reactions   Levaquin [Levofloxacin In D5w] Anaphylaxis and Swelling   Iodinated Contrast Media Itching    Severe itching   Other     Dust mites and opiates (all of them)   Latex Dermatitis    Outpatient Encounter Medications as of 09/18/2022  Medication Sig   aluminum hydroxide (DERMAGRAN) ointment Apply 1 application  topically daily as needed (skin protection). (Apply to suprapubic site)   aspirin EC 81 MG tablet Take 81 mg by mouth daily.   Azelastine HCl 0.15 % SOLN Place 2 sprays into both nostrils 2 (two) times daily.   bisacodyl (DULCOLAX) 10 MG suppository Place 10 mg rectally daily.   clonazePAM (KLONOPIN) 0.5 MG tablet Take 1 tablet (0.5 mg total) by mouth at bedtime.   diazepam (VALIUM) 10 MG tablet Take 1 tablet (10 mg total) by mouth 4 (four) times daily.   guaiFENesin (ROBITUSSIN) 100 MG/5ML liquid Take 10 mLs by mouth every 4 (four) hours as needed for cough or to loosen phlegm.   hydrocortisone cream 1 % Apply 1 Application topically 2 (two) times daily as needed for itching. (Apply to labia)   ibuprofen (ADVIL) 800 MG tablet Take 800 mg by mouth every 8 (eight) hours as needed for mild pain or moderate pain.   lansoprazole  (PREVACID) 15 MG capsule Take 15 mg by mouth daily.   levothyroxine (SYNTHROID) 25 MCG tablet Take 25 mcg by mouth daily before breakfast.   magnesium citrate SOLN Take 1 Bottle by mouth as needed for severe constipation.   magnesium hydroxide (MILK OF MAGNESIA) 400 MG/5ML suspension Take 5 mLs by mouth every 6 (six) hours as needed for mild constipation.   menthol-cetylpyridinium (CEPACOL) 3 MG lozenge Take 1 lozenge by mouth every 6 (six) hours as needed for sore throat.   nitroGLYCERIN (NITROSTAT) 0.4 MG SL tablet Place 0.4 mg  under the tongue every 5 (five) minutes x 3 doses as needed for chest pain.    nystatin-triamcinolone ointment (MYCOLOG) Apply 1 Application topically 2 (two) times daily as needed (yeast). (Apply to perineal area)   OLANZapine (ZYPREXA) 2.5 MG tablet Take 2.5 mg by mouth 2 (two) times daily.   ondansetron (ZOFRAN-ODT) 4 MG disintegrating tablet Take 4 mg by mouth every 8 (eight) hours as needed for nausea or vomiting.   polyethylene glycol (MIRALAX / GLYCOLAX) 17 g packet Take 17 g by mouth daily.   sulfamethoxazole-trimethoprim (BACTRIM DS) 800-160 MG tablet Take 1 tablet by mouth every 12 (twelve) hours for 5 days.   tiZANidine (ZANAFLEX) 2 MG tablet Take 2 mg by mouth every 8 (eight) hours as needed for muscle spasms.   Vibegron (GEMTESA) 75 MG TABS Take 75 mg by mouth daily.   Vitamin D, Ergocalciferol, (DRISDOL) 50000 units CAPS capsule Take 50,000 Units by mouth every Monday.   No facility-administered encounter medications on file as of 09/18/2022.    Review of Systems  Vitals:   09/18/22 0927  BP: (!) 136/96  Pulse: 96  Resp: 20  Temp: (!) 97.1 F (36.2 C)  SpO2: 97%  Weight: 160 lb (72.6 kg)  Height: 5' 1"$  (1.549 m)   Body mass index is 30.23 kg/m. Physical Exam  Labs reviewed: Basic Metabolic Panel: Recent Labs    09/14/22 0245 09/15/22 0252 09/16/22 0447  NA 138 135 136  K 3.8 3.2* 3.8  CL 105 101 102  CO2 19* 23 25  GLUCOSE 112* 86  101*  BUN 13 11 9  $ CREATININE 0.90 0.83 0.79  CALCIUM 8.4* 7.9* 8.4*   Liver Function Tests: Recent Labs    10/09/21 0530 03/01/22 1251 04/13/22 0000 06/01/22 0000 09/13/22 1016  AST 15 25 11* 12* 50*  ALT 8 8 7 $ 6* 19  ALKPHOS 79 73 101 86 86  BILITOT 0.5 0.6  --   --  1.3*  PROT 7.2 7.2  --   --  7.8  ALBUMIN 3.8 4.2 3.7 3.8 4.1   No results for input(s): "LIPASE", "AMYLASE" in the last 8760 hours. Recent Labs    09/13/22 1016  AMMONIA 24   CBC: Recent Labs    08/31/22 0000 09/13/22 1016 09/14/22 0245 09/15/22 0252 09/16/22 0447  WBC 7.0   < > 10.3 7.6 7.2  NEUTROABS 5,390.00  --   --  5.0 4.5  HGB 11.2*   < > 9.5* 8.8* 9.4*  HCT 37   < > 32.9* 29.6* 31.4*  MCV  --    < > 72.0* 70.1* 70.1*  PLT 345   < > 348 300 304   < > = values in this interval not displayed.   Cardiac Enzymes: No results for input(s): "CKTOTAL", "CKMB", "CKMBINDEX", "TROPONINI" in the last 8760 hours. BNP: Invalid input(s): "POCBNP" Lab Results  Component Value Date   HGBA1C 6.3 06/01/2022   Lab Results  Component Value Date   TSH 1.988 09/13/2022   No results found for: "VITAMINB12" No results found for: "FOLATE" Lab Results  Component Value Date   IRON 17 04/13/2022   TIBC 349 04/13/2022   FERRITIN 29 04/24/2021    Imaging and Procedures obtained prior to SNF admission: CT Head Wo Contrast  Result Date: 09/13/2022 CLINICAL DATA:  Delirium, fall EXAM: CT HEAD WITHOUT CONTRAST TECHNIQUE: Contiguous axial images were obtained from the base of the skull through the vertex without intravenous contrast. RADIATION DOSE REDUCTION: This exam  was performed according to the departmental dose-optimization program which includes automated exposure control, adjustment of the mA and/or kV according to patient size and/or use of iterative reconstruction technique. COMPARISON:  CT head dated 10/09/2021 FINDINGS: Brain: No evidence of acute infarction, hemorrhage, hydrocephalus, extra-axial  collection or mass lesion/mass effect. Encephalomalacic changes related to prior PCA distribution infarct. Extensive subcortical white matter and periventricular small vessel ischemic changes. Global cortical atrophy. Vascular: Intracranial atherosclerosis. Skull: Normal. Negative for fracture or focal lesion. Sinuses/Orbits: The visualized paranasal sinuses are essentially clear. The mastoid air cells are unopacified. Other: None. IMPRESSION: No evidence of acute intracranial abnormality. Atrophy with extensive small vessel ischemic changes. Old bilateral PCA distribution infarcts. Electronically Signed   By: Julian Hy M.D.   On: 09/13/2022 10:56   DG Chest Port 1 View  Result Date: 09/13/2022 CLINICAL DATA:  Confusion.  Altered mental status. EXAM: PORTABLE CHEST 1 VIEW COMPARISON:  03/01/2022 FINDINGS: The lungs are clear without focal pneumonia, edema, pneumothorax or pleural effusion. The cardiopericardial silhouette is within normal limits for size. The visualized bony structures of the thorax are unremarkable. Telemetry leads overlie the chest. IMPRESSION: No active disease. Electronically Signed   By: Misty Stanley M.D.   On: 09/13/2022 10:44    Assessment/Plan There are no diagnoses linked to this encounter.   Family/ staff Communication:   Labs/tests ordered:

## 2022-09-19 ENCOUNTER — Other Ambulatory Visit: Payer: Self-pay | Admitting: Nurse Practitioner

## 2022-09-19 MED ORDER — CLONAZEPAM 0.5 MG PO TABS: 0.5000 mg | ORAL_TABLET | Freq: Every day | ORAL | 3 refills | Status: DC

## 2022-09-19 MED ORDER — DIAZEPAM 10 MG PO TABS: 10.0000 mg | ORAL_TABLET | Freq: Four times a day (QID) | ORAL | 3 refills | Status: DC

## 2022-09-21 LAB — HEPATIC FUNCTION PANEL
ALT: 10 U/L (ref 7–35)
AST: 13 (ref 13–35)
Alkaline Phosphatase: 81 (ref 25–125)
Bilirubin, Total: 0.3

## 2022-09-21 LAB — BASIC METABOLIC PANEL
BUN: 7 (ref 4–21)
CO2: 27 — AB (ref 13–22)
Chloride: 100 (ref 99–108)
Creatinine: 1 (ref 0.5–1.1)
Glucose: 92
Potassium: 4.3 mEq/L (ref 3.5–5.1)
Sodium: 135 — AB (ref 137–147)

## 2022-09-21 LAB — COMPREHENSIVE METABOLIC PANEL
Albumin: 4.1 (ref 3.5–5.0)
Calcium: 8.9 (ref 8.7–10.7)
Globulin: 3.1

## 2022-09-21 LAB — CBC AND DIFFERENTIAL
HCT: 35 — AB (ref 36–46)
Hemoglobin: 10.2 — AB (ref 12.0–16.0)
Platelets: 368 10*3/uL (ref 150–400)
WBC: 9.6

## 2022-09-21 LAB — CBC: RBC: 4.92 (ref 3.87–5.11)

## 2022-10-02 NOTE — Progress Notes (Unsigned)
Suprapubic Cath Change  Patient is present today for a suprapubic catheter change due to urinary retention.  8 ml of water was drained from the balloon, a 18 FR Silastic foley cath was removed from the tract with out difficulty.  Site was cleaned and prepped in a sterile fashion with betadine.  A 18 FR Silastic foley cath was replaced into the tract no complications were noted. Urine return was noted, 10 ml of sterile water was inflated into the balloon and a overnight bag was attached for drainage.  Patient tolerated well.   Performed by: Zara Council, PA-C   Follow up: One month for SPT exchange

## 2022-10-03 ENCOUNTER — Ambulatory Visit (INDEPENDENT_AMBULATORY_CARE_PROVIDER_SITE_OTHER): Payer: Medicare Other | Admitting: Urology

## 2022-10-03 DIAGNOSIS — R339 Retention of urine, unspecified: Secondary | ICD-10-CM

## 2022-10-03 DIAGNOSIS — Z9359 Other cystostomy status: Secondary | ICD-10-CM

## 2022-11-06 NOTE — Progress Notes (Unsigned)
Suprapubic Cath Change  Patient is present today for a suprapubic catheter change due to urinary retention.  9 ml of water was drained from the balloon, a 18 FR Silastic foley cath was removed from the tract with out difficulty.  Site was cleaned and prepped in a sterile fashion with betadine.  A 18 FR Silastic foley cath was replaced into the tract no complications were noted. Urine return was noted, 10 ml of sterile water was inflated into the balloon and a night bag was attached for drainage.  Patient tolerated well.   Performed by: Michiel Cowboy, PA-C and Emi Belfast, CMA   Follow up: One month for SPT exchange

## 2022-11-07 ENCOUNTER — Ambulatory Visit (INDEPENDENT_AMBULATORY_CARE_PROVIDER_SITE_OTHER): Payer: Medicare Other | Admitting: Urology

## 2022-11-07 ENCOUNTER — Non-Acute Institutional Stay (SKILLED_NURSING_FACILITY): Payer: Medicare Other | Admitting: Nurse Practitioner

## 2022-11-07 ENCOUNTER — Encounter: Payer: Self-pay | Admitting: Nurse Practitioner

## 2022-11-07 DIAGNOSIS — I5020 Unspecified systolic (congestive) heart failure: Secondary | ICD-10-CM

## 2022-11-07 DIAGNOSIS — I25118 Atherosclerotic heart disease of native coronary artery with other forms of angina pectoris: Secondary | ICD-10-CM

## 2022-11-07 DIAGNOSIS — F331 Major depressive disorder, recurrent, moderate: Secondary | ICD-10-CM | POA: Diagnosis not present

## 2022-11-07 DIAGNOSIS — Z8673 Personal history of transient ischemic attack (TIA), and cerebral infarction without residual deficits: Secondary | ICD-10-CM

## 2022-11-07 DIAGNOSIS — K5901 Slow transit constipation: Secondary | ICD-10-CM

## 2022-11-07 DIAGNOSIS — F22 Delusional disorders: Secondary | ICD-10-CM

## 2022-11-07 DIAGNOSIS — Z9359 Other cystostomy status: Secondary | ICD-10-CM | POA: Diagnosis not present

## 2022-11-07 DIAGNOSIS — R339 Retention of urine, unspecified: Secondary | ICD-10-CM | POA: Diagnosis not present

## 2022-11-07 DIAGNOSIS — I10 Essential (primary) hypertension: Secondary | ICD-10-CM | POA: Diagnosis not present

## 2022-11-07 NOTE — Progress Notes (Signed)
Location:  Other [facility]) Nursing Home Room Number: 405 A Place of Service:  SNF (31)  Lucy Woolever K. Janyth Contes, NP   Patient Care Team: Earnestine Mealing, MD as PCP - General (Family Medicine) Iran Ouch, MD as PCP - Cardiology (Cardiology)  Extended Emergency Contact Information Primary Emergency Contact: Truly Ripley F[place] of [place] Home Phone: [phone] Mobile Phone: [phone] Relation: Sister Secondary Emergency Contact: Barron Alvine Address: [id number]          [place], [place] [address] Darden Amber of [place] Home Phone: [phone] Relation: Mother  Goals of care: Advanced Directive information    11/07/2022    2:28 PM  Advanced Directives  Does Patient Have a Medical Advance Directive? Yes  Type of Advance Directive Out of facility DNR (pink MOST or yellow form)  Does patient want to make changes to medical advance directive? No - Patient declined  Pre-existing out of facility DNR order (yellow form or pink MOST form) Yellow form placed in chart (order not valid for inpatient use)     Chief Complaint  Patient presents with   Medical Management of Chronic Issues    Routine visit. Foot exam today. Discuss need for eye exam, AWV, diabetic kidney evaluation, hep c screening, pap smear, colonoscopy, and mammogram or post one if patient refuses or is not a candidate.     HPI:  Pt is a 62 y.o. female seen today for medical management of chronic disease. Pt with hx of CAD, CVA, urinary retention, suprapubic cath. She was admitted to hospital in February with AMS due to UTI. She had follow up with urology today for suprapubic catheter without signs of infection.  Pt has had a better mood and disposition since being on Zyprexa. Less hallucinations noted. Reports ongoing anxiety. States valium at a "low dose" from what she has taken in the past.   She has chronic left sided hemiplegia, on ASA due to CVA. Reports intermediate pain on  left side.   Denies chest pains.   On synthroid for hypothyroid- TSH at goal in February.   Nursing without acute concerns. She declines vital signs frequently. Very selective with medication that she chooses to take. Does not want to take any medication for blood pressure or cholesterol.    Past Medical History:  Diagnosis Date   Acute ST elevation myocardial infarction (STEMI) of inferolateral wall 01/25/2020   Blind    Cholecystitis 04/22/2021   History of migraine headaches    Hyperlipemia    Myocardial infarction    Stroke    Past Surgical History:  Procedure Laterality Date   ABDOMINAL HYSTERECTOMY     BACK SURGERY     CARDIAC CATHETERIZATION     CORONARY/GRAFT ACUTE MI REVASCULARIZATION N/A 01/25/2020   Procedure: Coronary/Graft Acute MI Revascularization;  Surgeon: Iran Ouch, MD;  Location: [facility] INVASIVE CV LAB;  Service: Cardiovascular;  Laterality: N/A;   FRACTURE SURGERY     IR CATHETER TUBE CHANGE  08/10/2021   LEFT HEART CATH AND CORONARY ANGIOGRAPHY N/A 01/25/2020   Procedure: LEFT HEART CATH AND CORONARY ANGIOGRAPHY;  Surgeon: Iran Ouch, MD;  Location: [facility] INVASIVE CV LAB;  Service: Cardiovascular;  Laterality: N/A;   SKIN GRAFT Right     Allergies  Allergen Reactions   Levaquin [Levofloxacin In D5w] Anaphylaxis and Swelling   Iodinated Contrast Media Itching    Severe itching   Other     Dust mites and opiates (all of them)   Latex  Dermatitis    Outpatient Encounter Medications as of 11/07/2022  Medication Sig   aluminum hydroxide (DERMAGRAN) ointment Apply 1 application  topically daily as needed (skin protection). (Apply to suprapubic site)   aspirin EC 81 MG tablet Take 81 mg by mouth daily.   Azelastine HCl 0.15 % SOLN Place 2 sprays into both nostrils 2 (two) times daily.   clonazePAM (KLONOPIN) 0.5 MG tablet Take 1 tablet (0.5 mg total) by mouth at bedtime.   diazepam (VALIUM) 10 MG tablet Take 1 tablet (10 mg total) by mouth 4 (four)  times daily.   guaiFENesin (ROBITUSSIN) 100 MG/5ML liquid Take 10 mLs by mouth every 4 (four) hours as needed for cough or to loosen phlegm.   hydrocortisone cream 1 % Apply 1 Application topically 2 (two) times daily as needed for itching. (Apply to labia)   ibuprofen (ADVIL) 800 MG tablet Take 800 mg by mouth every 8 (eight) hours as needed for mild pain or moderate pain.   lansoprazole (PREVACID) 15 MG capsule Take 15 mg by mouth daily.   levothyroxine (SYNTHROID) 25 MCG tablet Take 25 mcg by mouth daily before breakfast.   magnesium citrate SOLN Take 1 Bottle by mouth as needed for severe constipation.   magnesium hydroxide (MILK OF MAGNESIA) 400 MG/5ML suspension Take 5 mLs by mouth every 6 (six) hours as needed for mild constipation.   menthol-cetylpyridinium (CEPACOL) 3 MG lozenge Take 1 lozenge by mouth every 6 (six) hours as needed for sore throat.   nitroGLYCERIN (NITROSTAT) 0.4 MG SL tablet Place 0.4 mg under the tongue every 5 (five) minutes x 3 doses as needed for chest pain.    nystatin-triamcinolone ointment (MYCOLOG) Apply 1 Application topically 2 (two) times daily as needed (yeast). (Apply to perineal area)   OLANZapine (ZYPREXA) 2.5 MG tablet Take 2.5 mg by mouth 2 (two) times daily.   ondansetron (ZOFRAN-ODT) 4 MG disintegrating tablet Take 4 mg by mouth every 8 (eight) hours as needed for nausea or vomiting.   polyethylene glycol (MIRALAX / GLYCOLAX) 17 g packet Take 17 g by mouth daily.   tiZANidine (ZANAFLEX) 2 MG tablet Take 2 mg by mouth every 8 (eight) hours as needed for muscle spasms.   Vibegron (GEMTESA) 75 MG TABS Take 75 mg by mouth daily.   Vitamin D, Ergocalciferol, (DRISDOL) 50000 units CAPS capsule Take 50,000 Units by mouth every Monday.   bisacodyl (DULCOLAX) 10 MG suppository Place 10 mg rectally daily.   No facility-administered encounter medications on file as of 11/07/2022.    Review of Systems  Constitutional:  Negative for activity change, appetite  change, fatigue and unexpected weight change.  HENT:  Negative for congestion and hearing loss.   Eyes: Negative.   Respiratory:  Negative for cough and shortness of breath.   Cardiovascular:  Negative for chest pain, palpitations and leg swelling.  Gastrointestinal:  Negative for abdominal pain, constipation and diarrhea.  Genitourinary:  Negative for difficulty urinating and dysuria.  Musculoskeletal:  Positive for arthralgias, gait problem and myalgias.  Skin:  Negative for color change and wound.  Neurological:  Positive for weakness. Negative for dizziness.  Psychiatric/Behavioral:  Negative for agitation, behavioral problems and confusion. The patient is nervous/anxious.      Immunization History  Administered Date(s) Administered   Influenza-Unspecified 04/28/2011, 06/14/2012   Moderna Sars-Covid-2 Vaccination 09/08/2019, 10/06/2019   Tdap 09/03/2013, 11/15/2016   Pertinent  Health Maintenance Due  Topic Date Due   FOOT EXAM  Never done   OPHTHALMOLOGY  EXAM  Never done   PAP SMEAR-Modifier  Never done   COLONOSCOPY (Pts 45-31yrs Insurance coverage will need to be confirmed)  Never done   MAMMOGRAM  Never done   HEMOGLOBIN A1C  11/30/2022   INFLUENZA VACCINE  Discontinued      10/09/2021    5:42 AM 11/26/2021    2:10 AM 02/09/2022    1:51 AM 03/01/2022    7:43 AM 05/28/2022    2:13 AM  Fall Risk  (RETIRED) Patient Fall Risk Level Moderate fall risk High fall risk Moderate fall risk Moderate fall risk Moderate fall risk   Functional Status Survey:    Vitals:   11/07/22 1426  BP: (!) 151/92  Pulse: 80  Weight: 170 lb 9.6 oz (77.4 kg)  Height:  (1.549 m)   Body mass index is 32.23 kg/m. Physical Exam Constitutional:      General: She is not in acute distress.    Appearance: She is well-developed. She is not diaphoretic.  HENT:     Head: Normocephalic and atraumatic.     Mouth/Throat:     Pharynx: No oropharyngeal exudate.  Eyes:     Conjunctiva/sclera:  Conjunctivae normal.     Pupils: Pupils are equal, round, and reactive to light.  Cardiovascular:     Rate and Rhythm: Normal rate and regular rhythm.     Heart sounds: Normal heart sounds.  Pulmonary:     Effort: Pulmonary effort is normal.     Breath sounds: Normal breath sounds.  Abdominal:     General: Bowel sounds are normal.     Palpations: Abdomen is soft.  Musculoskeletal:     Cervical back: Normal range of motion and neck supple.     Right lower leg: No edema.     Left lower leg: No edema.  Skin:    General: Skin is warm and dry.  Neurological:     Mental Status: She is alert. Mental status is at baseline.     Motor: Weakness present.     Gait: Gait abnormal.     Comments: Left sided hemiparesis.   Psychiatric:        Mood and Affect: Mood normal.     Labs reviewed: Recent Labs    09/14/22 0245 09/15/22 0252 09/16/22 0447 09/21/22 0000  NA 138 135 136 135*  K 3.8 3.2* 3.8 4.3  CL 105 101 102 100  CO2 19* 23 25 27*  GLUCOSE 112* 86 101*  --   BUN CREATININE 0.90 0.83 0.79 1.0  CALCIUM 8.4* 7.9* 8.4* 8.9   Recent Labs    03/01/22 1251 04/13/22 0000 06/01/22 0000 09/13/22 1016 09/21/22 0000  AST 25   < > 12* 50* 13  ALT 8   < > 6* 19 10  ALKPHOS 73   < > 86 86 81  BILITOT 0.6  --   --  1.3*  --   PROT 7.2  --   --  7.8  --   ALBUMIN 4.2   < > 3.8 4.1 4.1   < > = values in this interval not displayed.   Recent Labs    08/31/22 0000 09/13/22 1016 09/14/22 0245 09/15/22 0252 09/16/22 0447 09/21/22 0000  WBC 7.0   < > 10.3 7.6 7.2 9.6  NEUTROABS 5,390.00  --   --  5.0 4.5  --   HGB 11.2*   < > 9.5* 8.8* 9.4* 10.2*  HCT 37   < >  32.9* 29.6* 31.4* 35*  MCV  --    < > 72.0* 70.1* 70.1*  --   PLT 345   < > 348 300 304 368   < > = values in this interval not displayed.   Lab Results  Component Value Date   TSH 1.988 09/13/2022   Lab Results  Component Value Date   HGBA1C 6.3 06/01/2022   Lab Results  Component Value Date    CHOL 310 (H) 01/26/2020   HDL 59 01/26/2020   LDLCALC 197 (H) 01/26/2020   TRIG 269 (H) 01/26/2020   CHOLHDL 5.3 01/26/2020    Significant Diagnostic Results in last 30 days:  No results found.  Assessment/Plan 1. HFrEF (heart failure with reduced ejection fraction) -euvolemic, no worsening shortness of breath, edema.    2. Coronary artery disease of native artery of native heart with stable angina pectoris Continues on ASA, will update lipids   3. Essential hypertension Elevated, not on medication at this time, declines medication for high blood pressure at this time.   4. Major depressive disorder, recurrent, moderate -ongoing and stable.   5. History of stroke Continues on asa   6. Constipation by delayed colonic transit Stable on current regimen   7. Suprapubic catheter Ongoing follow up with urology  8. 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 Biagio BorgEubanks, AGNP [facility] [phone]

## 2022-11-09 LAB — LIPID PANEL
Cholesterol: 215 — AB (ref 0–200)
HDL: 55 (ref 35–70)
LDL Cholesterol: 138
Triglycerides: 107 (ref 40–160)

## 2022-11-27 ENCOUNTER — Other Ambulatory Visit: Payer: Self-pay | Admitting: Student

## 2022-11-27 DIAGNOSIS — Z1231 Encounter for screening mammogram for malignant neoplasm of breast: Secondary | ICD-10-CM

## 2022-11-29 LAB — CBC AND DIFFERENTIAL
HCT: 30 — AB (ref 36–46)
Hemoglobin: 9 — AB (ref 12.0–16.0)
Neutrophils Absolute: 6845
Platelets: 356 10*3/uL (ref 150–400)
WBC: 9.2

## 2022-11-29 LAB — LIPID PANEL
Cholesterol: 218 — AB (ref 0–200)
HDL: 64 (ref 35–70)
LDL Cholesterol: 135
Triglycerides: 89 (ref 40–160)

## 2022-11-29 LAB — CBC: RBC: 4.24 (ref 3.87–5.11)

## 2022-11-29 LAB — HEMOGLOBIN A1C: Hemoglobin A1C: 6.5

## 2022-11-30 ENCOUNTER — Ambulatory Visit
Admission: RE | Admit: 2022-11-30 | Discharge: 2022-11-30 | Disposition: A | Discharge status: Home / Self Care | Payer: Medicare Other | Source: Ambulatory Visit | Attending: Student | Admitting: Student

## 2022-11-30 DIAGNOSIS — Z1231 Encounter for screening mammogram for malignant neoplasm of breast: Secondary | ICD-10-CM | POA: Diagnosis present

## 2022-11-30 LAB — COMPREHENSIVE METABOLIC PANEL
Calcium: 8.6 — AB (ref 8.7–10.7)
eGFR: 79

## 2022-11-30 LAB — CBC AND DIFFERENTIAL
HCT: 31 — AB (ref 36–46)
Hemoglobin: 9.4 — AB (ref 12.0–16.0)
Neutrophils Absolute: 5452
Platelets: 399 10*3/uL (ref 150–400)
WBC: 7.7

## 2022-11-30 LAB — CBC: RBC: 4.46 (ref 3.87–5.11)

## 2022-11-30 LAB — HEMOGLOBIN A1C: Hemoglobin A1C: 6.4

## 2022-11-30 LAB — BASIC METABOLIC PANEL
BUN: 8 (ref 4–21)
CO2: 29 — AB (ref 13–22)
Chloride: 98 — AB (ref 99–108)
Creatinine: 0.8 (ref 0.5–1.1)
Glucose: 83
Potassium: 4.1 mEq/L (ref 3.5–5.1)
Sodium: 135 — AB (ref 137–147)

## 2022-12-06 NOTE — Progress Notes (Signed)
Error

## 2022-12-07 ENCOUNTER — Ambulatory Visit (INDEPENDENT_AMBULATORY_CARE_PROVIDER_SITE_OTHER): Payer: Medicare Other | Admitting: Urology

## 2022-12-07 ENCOUNTER — Other Ambulatory Visit: Payer: Self-pay | Admitting: Nurse Practitioner

## 2022-12-07 ENCOUNTER — Encounter: Payer: Self-pay | Admitting: Urology

## 2022-12-07 ENCOUNTER — Non-Acute Institutional Stay (INDEPENDENT_AMBULATORY_CARE_PROVIDER_SITE_OTHER): Payer: Medicare Other | Admitting: Nurse Practitioner

## 2022-12-07 ENCOUNTER — Encounter: Payer: Self-pay | Admitting: Nurse Practitioner

## 2022-12-07 DIAGNOSIS — Z0000 Encounter for general adult medical examination without abnormal findings: Secondary | ICD-10-CM | POA: Diagnosis not present

## 2022-12-07 DIAGNOSIS — Z9359 Other cystostomy status: Secondary | ICD-10-CM

## 2022-12-07 DIAGNOSIS — Z1212 Encounter for screening for malignant neoplasm of rectum: Secondary | ICD-10-CM

## 2022-12-07 DIAGNOSIS — Z1211 Encounter for screening for malignant neoplasm of colon: Secondary | ICD-10-CM

## 2022-12-07 DIAGNOSIS — R339 Retention of urine, unspecified: Secondary | ICD-10-CM | POA: Diagnosis not present

## 2022-12-07 NOTE — Progress Notes (Unsigned)
Suprapubic Cath Change  Patient is present today for a suprapubic catheter change due to urinary retention.  8ml of water was drained from the balloon, a 18 FR silastic foley cath was removed from the tract with out difficulty.  Site was cleaned and prepped in a sterile fashion with betadine.  A 18 FR silastic suprapubic foley cath was replaced into the tract no complications were noted. Urine return was noted, 10 ml of sterile water was inflated into the balloon and a night bag was attached for drainage.  Patient tolerated well.  Performed by: August Luz, RN   Follow up: follow up one month for suprapubic catheter change.

## 2022-12-07 NOTE — Progress Notes (Signed)
Subjective:   Simyah Gerich is a 62 y.o. female who presents for Medicare Annual (Subsequent) preventive examination.  Review of Systems     Cardiac Risk Factors include: advanced age (>66men, >48 women);hypertension;dyslipidemia;family history of premature cardiovascular disease;sedentary lifestyle     Objective:    Today's Vitals   12/07/22 1442 12/07/22 1456  BP: (!) 151/92 (!) 136/96  Pulse: 80   Resp: 16   Temp: 97.9 F (36.6 C)   SpO2: 100%   Weight: 173 lb 6.4 oz (78.7 kg)   Height: 5\' 1"  (1.549 m)    Body mass index is 32.76 kg/m.     12/07/2022    2:51 PM 11/07/2022    2:28 PM 09/18/2022    9:44 AM 09/14/2022   11:00 AM 08/07/2022    9:38 AM 07/27/2022    3:46 PM 06/09/2022    1:11 PM  Advanced Directives  Does Patient Have a Medical Advance Directive? Yes Yes Yes Yes Yes Yes Yes  Type of Advance Directive Out of facility DNR (pink MOST or yellow form) Out of facility DNR (pink MOST or yellow form) Out of facility DNR (pink MOST or yellow form) Out of facility DNR (pink MOST or yellow form) Out of facility DNR (pink MOST or yellow form) Out of facility DNR (pink MOST or yellow form) Out of facility DNR (pink MOST or yellow form)  Does patient want to make changes to medical advance directive? No - Patient declined No - Patient declined No - Patient declined No - Patient declined No - Patient declined No - Patient declined No - Patient declined  Would patient like information on creating a medical advance directive?    No - Patient declined     Pre-existing out of facility DNR order (yellow form or pink MOST form)  Yellow form placed in chart (order not valid for inpatient use)  Pink MOST/Yellow Form most recent copy in chart - Physician notified to receive inpatient order  Yellow form placed in chart (order not valid for inpatient use)     Current Medications (verified) Outpatient Encounter Medications as of 12/07/2022  Medication Sig   aluminum hydroxide (DERMAGRAN)  ointment Apply 1 application  topically daily as needed (skin protection). (Apply to suprapubic site)   aspirin EC 81 MG tablet Take 81 mg by mouth daily.   Azelastine HCl 0.15 % SOLN Place 2 sprays into both nostrils 2 (two) times daily.   clonazePAM (KLONOPIN) 0.5 MG tablet Take 1 tablet (0.5 mg total) by mouth at bedtime.   diazepam (VALIUM) 10 MG tablet Take 1 tablet (10 mg total) by mouth 4 (four) times daily.   guaiFENesin (ROBITUSSIN) 100 MG/5ML liquid Take 10 mLs by mouth every 4 (four) hours as needed for cough or to loosen phlegm.   Hydrocortisone Acet, Perivag, (VAGISIL) 1 % CREA Apply to Labia topically every 12 hours as needed for itching.   ibuprofen (ADVIL) 800 MG tablet Take 800 mg by mouth every 8 (eight) hours as needed for mild pain or moderate pain.   lansoprazole (PREVACID) 15 MG capsule Take 15 mg by mouth daily.   levothyroxine (SYNTHROID) 25 MCG tablet Take 25 mcg by mouth daily before breakfast.   magnesium citrate SOLN Take 1 Bottle by mouth as needed for severe constipation.   magnesium hydroxide (MILK OF MAGNESIA) 400 MG/5ML suspension Take 5 mLs by mouth every 6 (six) hours as needed for mild constipation.   menthol-cetylpyridinium (CEPACOL) 3 MG lozenge Take 1 lozenge by  mouth every 6 (six) hours as needed for sore throat.   nitroGLYCERIN (NITROSTAT) 0.4 MG SL tablet Place 0.4 mg under the tongue every 5 (five) minutes x 3 doses as needed for chest pain.    nystatin-triamcinolone ointment (MYCOLOG) Apply 1 Application topically 2 (two) times daily as needed (yeast). (Apply to perineal area)   OLANZapine (ZYPREXA) 2.5 MG tablet Take 2.5 mg by mouth 2 (two) times daily.   ondansetron (ZOFRAN-ODT) 4 MG disintegrating tablet Take 4 mg by mouth every 8 (eight) hours as needed for nausea or vomiting.   polyethylene glycol (MIRALAX / GLYCOLAX) 17 g packet Take 17 g by mouth daily.   tiZANidine (ZANAFLEX) 2 MG tablet Take 2 mg by mouth every 8 (eight) hours as needed for  muscle spasms.   Vibegron (GEMTESA) 75 MG TABS Take 75 mg by mouth daily.   Vitamin D, Ergocalciferol, (DRISDOL) 50000 units CAPS capsule Take 50,000 Units by mouth every Monday.   [DISCONTINUED] bisacodyl (DULCOLAX) 10 MG suppository Place 10 mg rectally daily.   [DISCONTINUED] hydrocortisone cream 1 % Apply 1 Application topically 2 (two) times daily as needed for itching. (Apply to labia)   No facility-administered encounter medications on file as of 12/07/2022.    Allergies (verified) Levaquin [levofloxacin in d5w], Iodinated contrast media, Other, and Latex   History: Past Medical History:  Diagnosis Date   Acute ST elevation myocardial infarction (STEMI) of inferolateral wall (HCC) 01/25/2020   Blind    Cholecystitis 04/22/2021   History of migraine headaches    Hyperlipemia    Myocardial infarction [facility])    Stroke [facility])    Past Surgical History:  Procedure Laterality Date   ABDOMINAL HYSTERECTOMY     BACK SURGERY     CARDIAC CATHETERIZATION     CORONARY/GRAFT ACUTE MI REVASCULARIZATION N/A 01/25/2020   Procedure: Coronary/Graft Acute MI Revascularization;  Surgeon: Iran Ouch, MD;  Location: [facility] INVASIVE CV LAB;  Service: Cardiovascular;  Laterality: N/A;   FRACTURE SURGERY     IR CATHETER TUBE CHANGE  08/10/2021   LEFT HEART CATH AND CORONARY ANGIOGRAPHY N/A 01/25/2020   Procedure: LEFT HEART CATH AND CORONARY ANGIOGRAPHY;  Surgeon: Iran Ouch, MD;  Location: [facility] INVASIVE CV LAB;  Service: Cardiovascular;  Laterality: N/A;   SKIN GRAFT Right    TOTAL ABDOMINAL HYSTERECTOMY Bilateral    Family History  Problem Relation Age of Onset   Heart attack Mother    Hypertension Mother    Hypercholesterolemia Mother    Diabetes Mother    Stroke Father    Heart attack Father    Hypertension Father    Hypercholesterolemia Father    Diabetes Father    Diabetes Maternal Grandmother    Cancer - Other Maternal Grandmother    Social History   Socioeconomic  History   Marital status: Single    Spouse name: Not on file   Number of children: Not on file   Years of education: Not on file   Highest education level: Not on file  Occupational History   Not on file  Tobacco Use   Smoking status: Never   Smokeless tobacco: Never  Vaping Use   Vaping Use: Never used  Substance and Sexual Activity   Alcohol use: Yes    Comment: occasional   Drug use: No   Sexual activity: Not on file  Other Topics Concern   Not on file  Social History Narrative   Not on file   Social Determinants of Health  Financial Resource Strain: Not on file  Food Insecurity: No Food Insecurity (09/14/2022)   Hunger Vital Sign    Worried About Running Out of Food in the Last Year: Never true    Ran Out of Food in the Last Year: Never true  Transportation Needs: No Transportation Needs (09/14/2022)   PRAPARE - Administrator, Civil Service (Medical): No    Lack of Transportation (Non-Medical): No  Physical Activity: Not on file  Stress: Not on file  Social Connections: Not on file    Tobacco Counseling Counseling given: Not Answered   Clinical Intake:  Pre-visit preparation completed: Yes  Pain : No/denies pain        How often do you need to have someone help you when you read instructions, pamphlets, or other written materials from your doctor or pharmacy?: 5 - Always  Diabetic?         Activities of Daily Living    12/07/2022    2:19 PM 09/14/2022   11:00 AM  In your present state of health, do you have any difficulty performing the following activities:  Hearing? 0 0  Vision? 1 1  Difficulty concentrating or making decisions? 0 1  Walking or climbing stairs? 1 1  Dressing or bathing? 1 0  Doing errands, shopping? 1 0  Preparing Food and eating ? Y   Using the Toilet? Y   In the past six months, have you accidently leaked urine? N   Do you have problems with loss of bowel control? Y   Managing your Medications? Y    Managing your Finances? Y   Housekeeping or managing your Housekeeping? Y     Patient Care Team: Earnestine Mealing, MD as PCP - General (Family Medicine) Iran Ouch, MD as PCP - Cardiology (Cardiology)  Indicate any recent Medical Services you may have received from other than [facility] providers in the past year (date may be approximate).     Assessment:   This is a routine wellness examination for Elvis.  Hearing/Vision screen No results found.  Dietary issues and exercise activities discussed: Current Exercise Habits: The patient does not participate in regular exercise at present   Goals Addressed   None    Depression Screen     No data to display          Fall Risk    12/07/2022    2:19 PM  Fall Risk   Falls in the past year? Exclusion - non ambulatory    FALL RISK PREVENTION PERTAINING TO THE HOME:  Any stairs in or around the home? No  If so, are there any without handrails? na Home free of loose throw rugs in walkways, pet beds, electrical cords, etc? Yes  Adequate lighting in your home to reduce risk of falls? Yes   ASSISTIVE DEVICES UTILIZED TO PREVENT FALLS:  Life alert? No  Use of a cane, walker or w/c? Yes  Grab bars in the bathroom? Yes  Shower chair or bench in shower? Yes  Elevated toilet seat or a handicapped toilet? Yes   TIMED UP AND GO:  Was the test performed? No .    Cognitive Function:        Immunizations Immunization History  Administered Date(s) Administered   Influenza-Unspecified 04/28/2011, 06/14/2012   Moderna Sars-Covid-2 Vaccination 09/08/2019, 10/06/2019, 06/11/2020   Tdap 09/03/2013, 11/15/2016    TDAP status: Up to date  Flu Vaccine status: Up to date  Pneumococcal vaccine status: Declined,  Education  has been provided regarding the importance of this vaccine but patient still declined. Advised may receive this vaccine at local pharmacy or Health Dept. Aware to provide a copy of the vaccination record if  obtained from local pharmacy or Health Dept. Verbalized acceptance and understanding.   Covid-19 vaccine status: Information provided on how to obtain vaccines.   Qualifies for Shingles Vaccine? Yes   Zostavax completed No   Shingrix Completed?: No.    Education has been provided regarding the importance of this vaccine. Patient has been advised to call insurance company to determine out of pocket expense if they have not yet received this vaccine. Advised may also receive vaccine at local pharmacy or Health Dept. Verbalized acceptance and understanding.  Screening Tests Health Maintenance  Topic Date Due   FOOT EXAM  Never done   OPHTHALMOLOGY EXAM  Never done   Diabetic kidney evaluation - Urine ACR  Never done   Hepatitis C Screening  Never done   COLONOSCOPY (Pts 45-56yrs Insurance coverage will need to be confirmed)  Never done   Zoster Vaccines- Shingrix (1 of 2) 09/26/2023 (Originally 07/09/1980)   HEMOGLOBIN A1C  06/02/2023   Diabetic kidney evaluation - eGFR measurement  11/30/2023   Medicare Annual Wellness (AWV)  12/07/2023   MAMMOGRAM  11/29/2024   DTaP/Tdap/Td (3 - Td or Tdap) 11/16/2026   HIV Screening  Completed   HPV VACCINES  Aged Out   INFLUENZA VACCINE  Discontinued   COVID-19 Vaccine  Discontinued    Health Maintenance  Health Maintenance Due  Topic Date Due   FOOT EXAM  Never done   OPHTHALMOLOGY EXAM  Never done   Diabetic kidney evaluation - Urine ACR  Never done   Hepatitis C Screening  Never done   COLONOSCOPY (Pts 45-59yrs Insurance coverage will need to be confirmed)  Never done    Cologuard order  Mammogram status: Completed 11/30/22. Repeat every year  Declines bone density   Lung Cancer Screening: (Low Dose CT Chest recommended if Age 65-80 years, 30 pack-year currently smoking OR have quit w/in 15years.) does not qualify.   Lung Cancer Screening Referral: na  Additional Screening:  Hepatitis C Screening: does qualify; Complete with next  blood work   Vision Screening: Recommended annual ophthalmology exams for early detection of glaucoma and other disorders of the eye. Is the patient up to date with their annual eye exam?  No    Dental Screening: Recommended annual dental exams for proper oral hygiene  Community Resource Referral / Chronic Care Management: CRR required this visit?  No   CCM required this visit?  No      Plan:     I have personally reviewed and noted the following in the patient's chart:   Medical and social history Use of alcohol, tobacco or illicit drugs  Current medications and supplements including opioid prescriptions. Patient is not currently taking opioid prescriptions. Functional ability and status Nutritional status Physical activity Advanced directives List of other physicians Hospitalizations, surgeries, and ER visits in previous 12 months Vitals Screenings to include cognitive, depression, and falls Referrals and appointments  In addition, I have reviewed and discussed with patient certain preventive protocols, quality metrics, and best practice recommendations. A written personalized care plan for preventive services as well as general preventive health recommendations were provided to patient.     Sharon Seller, NP   12/08/2022   Place of service: [facility]

## 2022-12-11 LAB — HM HEPATITIS C SCREENING LAB: HM Hepatitis Screen: NEGATIVE

## 2022-12-15 ENCOUNTER — Other Ambulatory Visit: Payer: Self-pay | Admitting: Student

## 2022-12-15 DIAGNOSIS — Z1211 Encounter for screening for malignant neoplasm of colon: Secondary | ICD-10-CM

## 2022-12-15 NOTE — Progress Notes (Signed)
Inadvertently thrown away by environmental specialist.

## 2022-12-26 ENCOUNTER — Encounter: Payer: Self-pay | Admitting: Nurse Practitioner

## 2022-12-26 ENCOUNTER — Non-Acute Institutional Stay (SKILLED_NURSING_FACILITY): Payer: Medicare Other | Admitting: Nurse Practitioner

## 2022-12-26 DIAGNOSIS — I10 Essential (primary) hypertension: Secondary | ICD-10-CM

## 2022-12-26 DIAGNOSIS — I25118 Atherosclerotic heart disease of native coronary artery with other forms of angina pectoris: Secondary | ICD-10-CM

## 2022-12-26 DIAGNOSIS — Z9359 Other cystostomy status: Secondary | ICD-10-CM

## 2022-12-26 DIAGNOSIS — F331 Major depressive disorder, recurrent, moderate: Secondary | ICD-10-CM | POA: Diagnosis not present

## 2022-12-26 DIAGNOSIS — H00012 Hordeolum externum right lower eyelid: Secondary | ICD-10-CM | POA: Diagnosis not present

## 2022-12-26 DIAGNOSIS — E782 Mixed hyperlipidemia: Secondary | ICD-10-CM

## 2022-12-26 DIAGNOSIS — K5901 Slow transit constipation: Secondary | ICD-10-CM

## 2022-12-26 DIAGNOSIS — F22 Delusional disorders: Secondary | ICD-10-CM

## 2022-12-26 DIAGNOSIS — E1169 Type 2 diabetes mellitus with other specified complication: Secondary | ICD-10-CM

## 2022-12-26 NOTE — Progress Notes (Unsigned)
Location:  Other [facility].  Nursing Home Room Number: [facility] [address] Place of Service:  SNF [phone]) Abbey Chatters, NP  PCP: Earnestine Mealing, MD  Patient Care Team: Earnestine Mealing, MD as PCP - General (Family Medicine) Iran Ouch, MD as PCP - Cardiology (Cardiology)  Extended Emergency Contact Information Primary Emergency Contact: Penelope Galas of [place] Home Phone: [phone] Mobile Phone: [phone] Relation: Sister Secondary Emergency Contact: Barron Alvine Address: [id number]          [place], [place] [address] Darden Amber of [place] Home Phone: [phone] Relation: Mother  Goals of care: Advanced Directive information    12/26/2022   12:22 PM  Advanced Directives  Does Patient Have a Medical Advance Directive? Yes  Type of Advance Directive Out of facility DNR (pink MOST or yellow form)  Does patient want to make changes to medical advance directive? No - Patient declined     Chief Complaint  Patient presents with   Medical Management of Chronic Issues    Medical Management of Chronic Issues   Acute Visit    Possible Stye     HPI:  Pt is a 62 y.o. female seen today for medical management of chronic disease. Pt with hx of CAD, hyperlipidemia,    Past Medical History:  Diagnosis Date   Acute ST elevation myocardial infarction (STEMI) of inferolateral wall (HCC) 01/25/2020   Blind    Cholecystitis 04/22/2021   History of migraine headaches    Hyperlipemia    Myocardial infarction [facility])    Stroke [facility])    Past Surgical History:  Procedure Laterality Date   ABDOMINAL HYSTERECTOMY     BACK SURGERY     CARDIAC CATHETERIZATION     CORONARY/GRAFT ACUTE MI REVASCULARIZATION N/A 01/25/2020   Procedure: Coronary/Graft Acute MI Revascularization;  Surgeon: Iran Ouch, MD;  Location: [facility] INVASIVE CV LAB;  Service: Cardiovascular;  Laterality: N/A;   FRACTURE SURGERY     IR CATHETER TUBE CHANGE   08/10/2021   LEFT HEART CATH AND CORONARY ANGIOGRAPHY N/A 01/25/2020   Procedure: LEFT HEART CATH AND CORONARY ANGIOGRAPHY;  Surgeon: Iran Ouch, MD;  Location: [facility] INVASIVE CV LAB;  Service: Cardiovascular;  Laterality: N/A;   SKIN GRAFT Right    TOTAL ABDOMINAL HYSTERECTOMY Bilateral     Allergies  Allergen Reactions   Levaquin [Levofloxacin In D5w] Anaphylaxis and Swelling   Iodinated Contrast Media Itching    Severe itching   Other     Dust mites and opiates (all of them)   Latex Dermatitis    Outpatient Encounter Medications as of 12/26/2022  Medication Sig   aluminum hydroxide (DERMAGRAN) ointment Apply 1 application  topically daily as needed (skin protection). (Apply to suprapubic site)   aspirin EC 81 MG tablet Take 81 mg by mouth daily.   Azelastine HCl 0.15 % SOLN Place 2 sprays into both nostrils 2 (two) times daily.   clonazePAM (KLONOPIN) 0.5 MG tablet Take 1 tablet (0.5 mg total) by mouth at bedtime.   diazepam (VALIUM) 10 MG tablet Take 1 tablet (10 mg total) by mouth 4 (four) times daily.   guaiFENesin (ROBITUSSIN) 100 MG/5ML liquid Take 10 mLs by mouth every 4 (four) hours as needed for cough or to loosen phlegm.   ibuprofen (ADVIL) 800 MG tablet Take 800 mg by mouth every 8 (eight) hours as needed for mild pain or moderate pain.   lansoprazole (PREVACID) 15 MG capsule Take 15 mg by mouth daily.  levothyroxine (SYNTHROID) 25 MCG tablet Take 25 mcg by mouth daily before breakfast.   magnesium citrate SOLN Take 1 Bottle by mouth as needed for severe constipation.   magnesium hydroxide (MILK OF MAGNESIA) 400 MG/5ML suspension Take 5 mLs by mouth every 6 (six) hours as needed for mild constipation.   menthol-cetylpyridinium (CEPACOL) 3 MG lozenge Take 1 lozenge by mouth every 6 (six) hours as needed for sore throat.   nitroGLYCERIN (NITROSTAT) 0.4 MG SL tablet Place 0.4 mg under the tongue every 5 (five) minutes x 3 doses as needed for chest pain.     nystatin-triamcinolone ointment (MYCOLOG) Apply 1 Application topically 2 (two) times daily as needed (yeast). (Apply to perineal area)   OLANZapine (ZYPREXA) 2.5 MG tablet Take 2.5 mg by mouth 2 (two) times daily.   ondansetron (ZOFRAN-ODT) 4 MG disintegrating tablet Take 4 mg by mouth every 8 (eight) hours as needed for nausea or vomiting.   polyethylene glycol (MIRALAX / GLYCOLAX) 17 g packet Take 17 g by mouth daily.   tiZANidine (ZANAFLEX) 2 MG tablet Take 2 mg by mouth every 8 (eight) hours as needed for muscle spasms.   Vibegron (GEMTESA) 75 MG TABS Take 75 mg by mouth daily.   Vitamin D, Ergocalciferol, (DRISDOL) 50000 units CAPS capsule Take 50,000 Units by mouth every Monday.   [DISCONTINUED] Hydrocortisone Acet, Perivag, (VAGISIL) 1 % CREA Apply to Labia topically every 12 hours as needed for itching.   No facility-administered encounter medications on file as of 12/26/2022.    Review of Systems  Constitutional:  Negative for activity change, appetite change, fatigue and unexpected weight change.  HENT:  Negative for congestion and hearing loss.   Respiratory:  Negative for cough and shortness of breath.   Cardiovascular:  Negative for chest pain, palpitations and leg swelling.  Gastrointestinal:  Negative for abdominal pain, constipation and diarrhea.  Genitourinary:  Negative for difficulty urinating and dysuria.       Suprapubic cath  Musculoskeletal:  Negative for arthralgias and myalgias.  Skin:  Negative for color change and wound.  Neurological:  Negative for dizziness and weakness.  Psychiatric/Behavioral:  Positive for confusion. Negative for agitation and behavioral problems.      Immunization History  Administered Date(s) Administered   Influenza-Unspecified 04/28/2011, 06/14/2012   Moderna Sars-Covid-2 Vaccination 09/08/2019, 10/06/2019, 06/11/2020   Tdap 09/03/2013, 11/15/2016   Pertinent  Health Maintenance Due  Topic Date Due   FOOT EXAM  Never done    OPHTHALMOLOGY EXAM  Never done   Colonoscopy  Never done   HEMOGLOBIN A1C  06/02/2023   MAMMOGRAM  11/29/2024   INFLUENZA VACCINE  Discontinued      11/26/2021    2:10 AM 02/09/2022    1:51 AM 03/01/2022    7:43 AM 05/28/2022    2:13 AM 12/07/2022    2:19 PM  Fall Risk  Falls in the past year?     Exclusion - non ambulatory  (RETIRED) Patient Fall Risk Level High fall risk Moderate fall risk Moderate fall risk Moderate fall risk    Functional Status Survey:    Vitals:   12/26/22 1214 12/26/22 1224  BP: (!) 142/92 (!) 151/92  Pulse: 75   Resp: 16   Temp: 97.9 F (36.6 C)   SpO2: 100%   Weight: 173 lb 6.4 oz (78.7 kg)   Height: 5\' 1"  (1.549 m)    Body mass index is 32.76 kg/m. Physical Exam Constitutional:      General: She is not  in acute distress.    Appearance: She is well-developed. She is not diaphoretic.  HENT:     Head: Normocephalic and atraumatic.     Mouth/Throat:     Pharynx: No oropharyngeal exudate.  Eyes:     General:        Right eye: Hordeolum present.     Conjunctiva/sclera: Conjunctivae normal.     Pupils: Pupils are equal, round, and reactive to light.  Cardiovascular:     Rate and Rhythm: Normal rate and regular rhythm.     Heart sounds: Normal heart sounds.  Pulmonary:     Effort: Pulmonary effort is normal.     Breath sounds: Normal breath sounds.  Abdominal:     General: Bowel sounds are normal.     Palpations: Abdomen is soft.  Musculoskeletal:     Cervical back: Normal range of motion and neck supple.     Right lower leg: No edema.     Left lower leg: No edema.  Skin:    General: Skin is warm and dry.  Neurological:     Mental Status: She is alert. Mental status is at baseline.     Motor: Weakness present.     Gait: Gait abnormal.  Psychiatric:        Mood and Affect: Mood normal.     Labs reviewed: Recent Labs    09/14/22 0245 09/15/22 0252 09/16/22 0447 09/21/22 0000 11/30/22 0000  NA 138 135 136 135* 135*  K 3.8 3.2*  3.8 4.3 4.1  CL 105 101 102 100 98*  CO2 19* 23 25 27* 29*  GLUCOSE 112* 86 101*  --   --   BUN 13 11 9 7 8   CREATININE 0.90 0.83 0.79 1.0 0.8  CALCIUM 8.4* 7.9* 8.4* 8.9 8.6*   Recent Labs    03/01/22 1251 04/13/22 0000 06/01/22 0000 09/13/22 1016 09/21/22 0000  AST 25   < > 12* 50* 13  ALT 8   < > 6* 19 10  ALKPHOS 73   < > 86 86 81  BILITOT 0.6  --   --  1.3*  --   PROT 7.2  --   --  7.8  --   ALBUMIN 4.2   < > 3.8 4.1 4.1   < > = values in this interval not displayed.   Recent Labs    09/14/22 0245 09/15/22 0252 09/16/22 0447 09/21/22 0000 11/29/22 0000 11/30/22 0000  WBC 10.3 7.6 7.2 9.6 9.2 7.7  NEUTROABS  --  5.0 4.5  --  6,845.00 5,452.00  HGB 9.5* 8.8* 9.4* 10.2* 9.0* 9.4*  HCT 32.9* 29.6* 31.4* 35* 30* 31*  MCV 72.0* 70.1* 70.1*  --   --   --   PLT 348 300 304 368 356 399   Lab Results  Component Value Date   TSH 1.988 09/13/2022   Lab Results  Component Value Date   HGBA1C 6.4 11/30/2022   Lab Results  Component Value Date   CHOL 218 (A) 11/29/2022   HDL 64 11/29/2022   LDLCALC 135 11/29/2022   TRIG 89 11/29/2022   CHOLHDL 5.3 01/26/2020    Significant Diagnostic Results in last 30 days:  MM 3D SCREENING MAMMOGRAM BILATERAL BREAST  Result Date: 12/01/2022 CLINICAL DATA:  Screening. EXAM: DIGITAL SCREENING BILATERAL MAMMOGRAM WITH TOMOSYNTHESIS AND CAD TECHNIQUE: Bilateral screening digital craniocaudal and mediolateral oblique mammograms were obtained. Bilateral screening digital breast tomosynthesis was performed. The images were evaluated with computer-aided detection. COMPARISON:  Previous  exam(s). ACR Breast Density Category b: There are scattered areas of fibroglandular density. FINDINGS: Technically suboptimal mammogram due to difficulties in positioning. There are no findings suspicious for malignancy. IMPRESSION: No mammographic evidence of malignancy, accounting for technically suboptimal mammogram images. A result letter of this screening  mammogram will be mailed directly to the patient. RECOMMENDATION: Screening mammogram in one year. (Code:SM-B-01Y) BI-RADS CATEGORY  1: Negative. Electronically Signed   By: Ted Mcalpine M.D.   On: 12/01/2022 16:02    Assessment/Plan 1. Hordeolum externum of right lower eyelid -to use warm compresses throughout the day- encouraged at least QID.   2. Essential hypertension Pt has elevated blood pressures on multiple readings, not agreeable to add or change medication at this time. Aware of adverse effects of elevated bp over time and risked of CVA/MI.   3. Coronary artery disease of native artery of native heart with stable angina pectoris (HCC) -stable without chest pains at this time.   4. Major depressive disorder, recurrent, moderate (HCC) Ongoing but stable   5. Constipation by delayed colonic transit Well controlled on current regimen  6. Delusional disorder (HCC) Continues on zyprexa, delusions stable at this time.   7. Suprapubic catheter (HCC) Stable, continues with monthly changes through urology.   8. Type 2 diabetes mellitus with other specified complication, without long-term current use of insulin (HCC) -Encouraged dietary compliance, routine foot care/monitoring and to keep up with diabetic eye exams through ophthalmology  -A1c at goal on last labs.   9. Mixed hyperlipidemia -goal LDL <70, she is not agreeable to take medication despite the risk of elevated cholesterol. Educated about risk of MI/CVA    Janene Harvey. [facility] [phone]

## 2023-01-03 NOTE — Progress Notes (Unsigned)
Suprapubic Cath Change  Patient is present today for a suprapubic catheter change due to urinary retention.  ***ml of water was drained from the balloon, a ***FR foley cath was removed from the tract with out difficulty.  Site was cleaned and prepped in a sterile fashion with betadine.  A ***FR foley cath was replaced into the tract {dnt complications:20057}. Urine return was noted, 10 ml of sterile water was inflated into the balloon and a *** bag was attached for drainage.  Patient tolerated well. A night bag was given to patient and proper instruction was given on how to switch bags.    Performed by: ***  Follow up: No follow-ups on file.  

## 2023-01-04 ENCOUNTER — Ambulatory Visit (INDEPENDENT_AMBULATORY_CARE_PROVIDER_SITE_OTHER): Payer: Medicare Other | Admitting: Urology

## 2023-01-04 DIAGNOSIS — R339 Retention of urine, unspecified: Secondary | ICD-10-CM | POA: Diagnosis not present

## 2023-01-04 DIAGNOSIS — Z9359 Other cystostomy status: Secondary | ICD-10-CM

## 2023-01-24 ENCOUNTER — Other Ambulatory Visit: Payer: Self-pay

## 2023-01-24 ENCOUNTER — Emergency Department: Payer: Medicare Other

## 2023-01-24 ENCOUNTER — Emergency Department
Admission: EM | Admit: 2023-01-24 | Discharge: 2023-01-24 | Disposition: A | Discharge status: Home / Self Care | Payer: Medicare Other | Attending: Emergency Medicine | Admitting: Emergency Medicine

## 2023-01-24 ENCOUNTER — Encounter: Payer: Self-pay | Admitting: Emergency Medicine

## 2023-01-24 DIAGNOSIS — I69359 Hemiplegia and hemiparesis following cerebral infarction affecting unspecified side: Secondary | ICD-10-CM | POA: Diagnosis not present

## 2023-01-24 DIAGNOSIS — M79602 Pain in left arm: Secondary | ICD-10-CM | POA: Insufficient documentation

## 2023-01-24 LAB — BASIC METABOLIC PANEL
Anion gap: 10 (ref 5–15)
BUN: 8 mg/dL (ref 8–23)
CO2: 24 mmol/L (ref 22–32)
Calcium: 8.2 mg/dL — ABNORMAL LOW (ref 8.9–10.3)
Chloride: 98 mmol/L (ref 98–111)
Creatinine, Ser: 0.72 mg/dL (ref 0.44–1.00)
GFR, Estimated: >60 mL/min (ref >60)
Glucose, Bld: 178 mg/dL — ABNORMAL HIGH (ref 70–99)
Potassium: 3.4 mmol/L — ABNORMAL LOW (ref 3.5–5.1)
Sodium: 132 mmol/L — ABNORMAL LOW (ref 135–145)

## 2023-01-24 LAB — CBC
HCT: 34.4 % — ABNORMAL LOW (ref 36.0–46.0)
Hemoglobin: 9.3 g/dL — ABNORMAL LOW (ref 12.0–15.0)
MCH: 20.8 pg — ABNORMAL LOW (ref 26.0–34.0)
MCHC: 27 g/dL — ABNORMAL LOW (ref 30.0–36.0)
MCV: 76.8 fL — ABNORMAL LOW (ref 80.0–100.0)
Platelets: 283 10*3/uL (ref 150–400)
RBC: 4.48 MIL/uL (ref 3.87–5.11)
RDW: 16.4 % — ABNORMAL HIGH (ref 11.5–15.5)
WBC: 9 10*3/uL (ref 4.0–10.5)
nRBC: 0 % (ref 0.0–0.2)

## 2023-01-24 LAB — TROPONIN I (HIGH SENSITIVITY)
Troponin I (High Sensitivity): 5 ng/L (ref <18)
Troponin I (High Sensitivity): 8 ng/L (ref <18)

## 2023-01-24 NOTE — ED Triage Notes (Addendum)
Pt arrived via [facility] from [facility], pt is blind, pt reports central chest pain and R arm pain, that started yesterday morning.   Pt also reports shortness of breath, pt reports she feels like she is panting.  No distress noted at this time.   Pt has hx of CVA, contracted on the L, has suprapubic catheter as well.

## 2023-01-24 NOTE — ED Provider Notes (Signed)
[facility] Provider Note    Event Date/Time   First MD Initiated Contact with Patient 01/24/23 [phone]     (approximate)   History   Chief Complaint: Chest Pain and Arm Pain   HPI  Maisee Vollman is a 62 y.o. female with a history of stroke with left-sided paralysis, blindness, STEMI, PE who comes ED complaining of left arm pain that started about 24 hours ago.  No aggravating or alleviating factors, was continuous and is now resolved.  Denies chest pain shortness of breath cough or fever.  No vomiting or diarrhea.  Reports eating and drinking normally.  No acute headache.     Physical Exam   Triage Vital Signs: ED Triage Vitals  Enc Vitals Group     BP 01/24/23 0343 129/71     Pulse Rate 01/24/23 0343 84     Resp 01/24/23 0343 17     Temp 01/24/23 0343 98.1 F (36.7 C)     Temp src --      SpO2 01/24/23 0343 95 %     Weight 01/24/23 0257 173 lb 8 oz (78.7 kg)     Height 01/24/23 0257 5\' 1"  (1.549 m)     Head Circumference --      Peak Flow --      Pain Score 01/24/23 0255 5     Pain Loc --      Pain Edu? --      Excl. in GC? --     Most recent vital signs: Vitals:   01/24/23 0343  BP: 129/71  Pulse: 84  Resp: 17  Temp: 98.1 F (36.7 C)  SpO2: 95%    General: Awake, no distress.  CV:  Good peripheral perfusion.  Regular rate and rhythm Resp:  Normal effort.  Clear to auscultation bilaterally Abd:  No distention.  Soft nontender Other:  Moist oral mucosa.  No extremity edema   ED Results / Procedures / Treatments   Labs (all labs ordered are listed, but only abnormal results are displayed) Labs Reviewed  BASIC METABOLIC PANEL - Abnormal; Notable for the following components:      Result Value   Sodium 132 (*)    Potassium 3.4 (*)    Glucose, Bld 178 (*)    Calcium 8.2 (*)    All other components within normal limits  CBC - Abnormal; Notable for the following components:   Hemoglobin 9.3 (*)    HCT 34.4 (*)    MCV 76.8 (*)     MCH 20.8 (*)    MCHC 27.0 (*)    RDW 16.4 (*)    All other components within normal limits  TROPONIN I (HIGH SENSITIVITY)  TROPONIN I (HIGH SENSITIVITY)     EKG Interpreted by me Normal sinus rhythm rate of 84.  Normal axis, normal intervals.  Normal QRS ST segments and T waves.   RADIOLOGY Chest x-ray interpreted by me, appears normal.  Radiology report reviewed   PROCEDURES:  Procedures   MEDICATIONS ORDERED IN ED: Medications - No data to display   IMPRESSION / MDM / ASSESSMENT AND PLAN / ED COURSE  I reviewed the triage vital signs and the nursing notes.  DDx: Dehydration, anemia, electrolyte abnormality, musculoskeletal pain, neuropathy, NSTEMI.  Patient's presentation is most consistent with acute presentation with potential threat to life or bodily function.  Patient presents with an episode of left arm pain yesterday which is now resolved.  Vitals are normal, labs are normal, EKG  chest x-ray unremarkable. Considering the patient's symptoms, medical history, and physical examination today, I have low suspicion for ACS, PE, TAD, pneumothorax, carditis, mediastinitis, pneumonia, CHF, or sepsis. I suspect this is related to her chronic paralysis on that side secondary to stroke. 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   Sharman Cheek, MD 01/24/23 [phone]

## 2023-01-24 NOTE — Discharge Instructions (Signed)
Your EKG, chest xray, and labs were all normal today.

## 2023-01-24 NOTE — ED Notes (Signed)
Stretcher triage   Sharman Cheek, MD 01/24/23 [phone]

## 2023-01-24 NOTE — ED Notes (Signed)
Transport with [facility] arranged

## 2023-01-26 ENCOUNTER — Other Ambulatory Visit: Payer: Self-pay | Admitting: Nurse Practitioner

## 2023-01-26 NOTE — Telephone Encounter (Signed)
Patient is requesting a refill of the following medications: Requested Prescriptions   Pending Prescriptions Disp Refills   diazepam (VALIUM) 10 MG tablet [Pharmacy Med Name: diazePAM 10 MG Tablet] 120 tablet 5    Sig: TAKE 1 TABLET BY MOUTH FOUR TIMES A DAY    Date of last refill:09/19/22  Refill amount: 3  Treatment agreement date: Unknown

## 2023-01-31 ENCOUNTER — Encounter: Payer: Self-pay | Admitting: Student

## 2023-01-31 ENCOUNTER — Non-Acute Institutional Stay: Payer: Self-pay | Admitting: Student

## 2023-01-31 DIAGNOSIS — F331 Major depressive disorder, recurrent, moderate: Secondary | ICD-10-CM

## 2023-01-31 DIAGNOSIS — I5020 Unspecified systolic (congestive) heart failure: Secondary | ICD-10-CM

## 2023-01-31 DIAGNOSIS — F22 Delusional disorders: Secondary | ICD-10-CM | POA: Diagnosis not present

## 2023-01-31 DIAGNOSIS — I2699 Other pulmonary embolism without acute cor pulmonale: Secondary | ICD-10-CM | POA: Diagnosis not present

## 2023-01-31 DIAGNOSIS — K5901 Slow transit constipation: Secondary | ICD-10-CM

## 2023-01-31 DIAGNOSIS — Z66 Do not resuscitate: Secondary | ICD-10-CM | POA: Diagnosis not present

## 2023-01-31 DIAGNOSIS — E1169 Type 2 diabetes mellitus with other specified complication: Secondary | ICD-10-CM

## 2023-01-31 NOTE — Progress Notes (Signed)
Location:  Other [facility]) Nursing Home Room Number: 405 A Place of Service:  SNF [phone]) Provider:  Earnestine Mealing, MD  Patient Care Team: Earnestine Mealing, MD as PCP - General (Family Medicine) Iran Ouch, MD as PCP - Cardiology (Cardiology)  Extended Emergency Contact Information Primary Emergency Contact: Penelope Galas of [place] Home Phone: [phone] Mobile Phone: [phone] Relation: Sister Secondary Emergency Contact: Barron Alvine Address: [id number]          [place], [place] [address] Darden Amber of [place] Home Phone: [phone] Relation: Mother  Code Status:  DNR Goals of care: Advanced Directive information    01/31/2023   10:05 AM  Advanced Directives  Does Patient Have a Medical Advance Directive? Yes  Type of Advance Directive Out of facility DNR (pink MOST or yellow form)  Does patient want to make changes to medical advance directive? No - Patient declined  Pre-existing out of facility DNR order (yellow form or pink MOST form) Yellow form placed in chart (order not valid for inpatient use)    Chief Complaint  Patient presents with   Medical Management of Chronic Issues    Routine visit. Discus need for foot exam, eye exam, diabetic kidney evaluation, colonoscopy, and Hep C Screening.     HPI:  Pt is a 62 y.o. female seen today for medical management of chronic diseases.  Patient was seen in the emergency department for chest pain. Negative findings. She denies pain at this time.   She had 2 falls in the last 24 hours. She can't give details of the falls, but told nursing she was reaching for something and fell.  She has had hallucinations. She is telling me she was taped to the microblade and her tube needs to be cut. She states this tube needs to be detatched so she can have a bowel movement. She is confused about where she is and which job she is supposed to be doing to day. She needs to "cut the tube" but it  needs to stay in my stomach, I just can't stay attached to the microblade.    Past Medical History:  Diagnosis Date   Acute ST elevation myocardial infarction (STEMI) of inferolateral wall (HCC) 01/25/2020   Blind    Cholecystitis 04/22/2021   History of migraine headaches    Hyperlipemia    Myocardial infarction [facility])    Stroke [facility])    Past Surgical History:  Procedure Laterality Date   ABDOMINAL HYSTERECTOMY     BACK SURGERY     CARDIAC CATHETERIZATION     CORONARY/GRAFT ACUTE MI REVASCULARIZATION N/A 01/25/2020   Procedure: Coronary/Graft Acute MI Revascularization;  Surgeon: Iran Ouch, MD;  Location: [facility] INVASIVE CV LAB;  Service: Cardiovascular;  Laterality: N/A;   FRACTURE SURGERY     IR CATHETER TUBE CHANGE  08/10/2021   LEFT HEART CATH AND CORONARY ANGIOGRAPHY N/A 01/25/2020   Procedure: LEFT HEART CATH AND CORONARY ANGIOGRAPHY;  Surgeon: Iran Ouch, MD;  Location: [facility] INVASIVE CV LAB;  Service: Cardiovascular;  Laterality: N/A;   SKIN GRAFT Right    TOTAL ABDOMINAL HYSTERECTOMY Bilateral     Allergies  Allergen Reactions   Levaquin [Levofloxacin In D5w] Anaphylaxis and Swelling   Iodinated Contrast Media Itching    Severe itching   Other     Dust mites and opiates (all of them)   Latex Dermatitis    Outpatient Encounter Medications as of 01/31/2023  Medication Sig   aluminum hydroxide (  DERMAGRAN) ointment Apply 1 application  topically daily as needed (skin protection). (Apply to suprapubic site)   aspirin EC 81 MG tablet Take 81 mg by mouth daily.   Azelastine HCl 0.15 % SOLN Place 2 sprays into both nostrils 2 (two) times daily.   Cholecalciferol (VITAMIN D3) 1.25 MG (50000 UT) CAPS Take 1 capsule by mouth once a week.  every Mon for Supplement   diazepam (VALIUM) 10 MG tablet TAKE 1 TABLET BY MOUTH FOUR TIMES A DAY   guaiFENesin (ROBITUSSIN) 100 MG/5ML liquid Take 10 mLs by mouth every 4 (four) hours as needed for cough or to loosen phlegm.    Hydrocortisone Acet, Perivag, (VAGISIL) 1 % CREA Apply topically. Apply to labia topically every 12 hours as needed   ibuprofen (ADVIL) 800 MG tablet Take 800 mg by mouth every 8 (eight) hours as needed for mild pain or moderate pain.   lansoprazole (PREVACID) 15 MG capsule Take 15 mg by mouth daily.   levothyroxine (SYNTHROID) 25 MCG tablet Take 25 mcg by mouth daily before breakfast.   magnesium citrate SOLN Take 1 Bottle by mouth as needed for severe constipation.   magnesium hydroxide (MILK OF MAGNESIA) 400 MG/5ML suspension Take 5 mLs by mouth every 6 (six) hours as needed for mild constipation.   menthol-cetylpyridinium (CEPACOL) 3 MG lozenge Take 1 lozenge by mouth every 6 (six) hours as needed for sore throat.   nitroGLYCERIN (NITROSTAT) 0.4 MG SL tablet Place 0.4 mg under the tongue every 5 (five) minutes x 3 doses as needed for chest pain.    nystatin-triamcinolone ointment (MYCOLOG) Apply 1 Application topically 2 (two) times daily as needed (yeast). (Apply to perineal area)   OLANZapine (ZYPREXA) 5 MG tablet Take 5 mg by mouth 2 (two) times daily.   ondansetron (ZOFRAN-ODT) 4 MG disintegrating tablet Take 4 mg by mouth every 8 (eight) hours as needed for nausea or vomiting.   polyethylene glycol (MIRALAX / GLYCOLAX) 17 g packet Take 17 g by mouth daily.   tiZANidine (ZANAFLEX) 2 MG tablet Take 2 mg by mouth every 8 (eight) hours as needed for muscle spasms.   Vibegron (GEMTESA) 75 MG TABS Take 75 mg by mouth daily.   [DISCONTINUED] clonazePAM (KLONOPIN) 0.5 MG tablet Take 1 tablet (0.5 mg total) by mouth at bedtime.   No facility-administered encounter medications on file as of 01/31/2023.    Review of Systems  Immunization History  Administered Date(s) Administered   Influenza-Unspecified 04/28/2011, 06/14/2012   Moderna Sars-Covid-2 Vaccination 09/08/2019, 10/06/2019, 06/11/2020   Tdap 09/03/2013, 11/15/2016   Pertinent  Health Maintenance Due  Topic Date Due   FOOT EXAM   Never done   OPHTHALMOLOGY EXAM  Never done   Colonoscopy  Never done   HEMOGLOBIN A1C  06/02/2023   MAMMOGRAM  11/29/2024   INFLUENZA VACCINE  Discontinued      11/26/2021    2:10 AM 02/09/2022    1:51 AM 03/01/2022    7:43 AM 05/28/2022    2:13 AM 12/07/2022    2:19 PM  Fall Risk  Falls in the past year?     Exclusion - non ambulatory  (RETIRED) Patient Fall Risk Level High fall risk Moderate fall risk Moderate fall risk Moderate fall risk    Functional Status Survey:    Vitals:   01/31/23 1004  BP: (!) 93/55  Pulse: 75  Resp: 18  Temp: 97.8 F (36.6 C)  SpO2: 96%  Weight: 182 lb (82.6 kg)  Height: 5\' 1"  (  1.549 m)   Body mass index is 34.39 kg/m. Physical Exam Cardiovascular:     Rate and Rhythm: Normal rate and regular rhythm.  Pulmonary:     Effort: Pulmonary effort is normal.  Abdominal:     Palpations: Abdomen is soft.     Comments: Suprapubic catheter in place, no surrounding erythema or drainage coming from around the tube.   Neurological:     Mental Status: She is alert. She is disoriented.     Comments: hallucinations     Labs reviewed: Recent Labs    09/15/22 0252 09/16/22 0447 09/21/22 0000 11/30/22 0000 01/24/23 0307  NA 135 136 135* 135* 132*  K 3.2* 3.8 4.3 4.1 3.4*  CL 101 102 100 98* 98  CO2 23 25 27* 29* 24  GLUCOSE 86 101*  --   --  178*  BUN 11 9 7 8 8   CREATININE 0.83 0.79 1.0 0.8 0.72  CALCIUM 7.9* 8.4* 8.9 8.6* 8.2*   Recent Labs    03/01/22 1251 04/13/22 0000 06/01/22 0000 09/13/22 1016 09/21/22 0000  AST 25   < > 12* 50* 13  ALT 8   < > 6* 19 10  ALKPHOS 73   < > 86 86 81  BILITOT 0.6  --   --  1.3*  --   PROT 7.2  --   --  7.8  --   ALBUMIN 4.2   < > 3.8 4.1 4.1   < > = values in this interval not displayed.   Recent Labs    09/15/22 0252 09/16/22 0447 09/21/22 0000 11/29/22 0000 11/30/22 0000 01/24/23 0307  WBC 7.6 7.2   < > 9.2 7.7 9.0  NEUTROABS 5.0 4.5  --  6,845.00 5,452.00  --   HGB 8.8* 9.4*   < >  9.0* 9.4* 9.3*  HCT 29.6* 31.4*   < > 30* 31* 34.4*  MCV 70.1* 70.1*  --   --   --  76.8*  PLT 300 304   < > 356 399 283   < > = values in this interval not displayed.   Lab Results  Component Value Date   TSH 1.988 09/13/2022   Lab Results  Component Value Date   HGBA1C 6.4 11/30/2022   Lab Results  Component Value Date   CHOL 218 (A) 11/29/2022   HDL 64 11/29/2022   LDLCALC 135 11/29/2022   TRIG 89 11/29/2022   CHOLHDL 5.3 01/26/2020    Significant Diagnostic Results in last 30 days:  DG Chest 1 View  Result Date: 01/24/2023 CLINICAL DATA:  Central chest pain. EXAM: CHEST  1 VIEW COMPARISON:  September 13, 2022 FINDINGS: The heart size and mediastinal contours are within normal limits. There is marked severity calcification of the aortic arch. Both lungs are clear. A radiopaque fusion plate and screws are seen overlying the lower cervical spine. The visualized skeletal structures are unremarkable. IMPRESSION: No active disease. Electronically Signed   By: Aram Candela M.D.   On: 01/24/2023 03:32    Assessment/Plan Do not resuscitate - Plan: Do not attempt resuscitation (DNR)  PE (pulmonary thromboembolism) (HCC), Chronic  Delusional disorder (HCC)  Major depressive disorder, recurrent, moderate (HCC)  Type 2 diabetes mellitus with other specified complication, without long-term current use of insulin (HCC)  HFrEF (heart failure with reduced ejection fraction) (HCC)  Constipation by delayed colonic transit Patient continues to have hallucinations which have been a hazard to the patient's safety leading to her fall last night  and today. Plan to increase olanzapine to 5mg  BID. Patient failed dose reduction of zyprexa. Continue anticoagulation. Patient has numerous psychoactive medications, will need to continue discussions and dose reduction of these medications. DM well-controlled. Appears euvolemic on exam. Vital signs stable, no clear symptoms of infectious process.  Will continue supportive care at this time. Patient has required multiple treatments for constipation, schedule senna nightly. PRN dulcolax.    Family/ staff Communication: Nursing  Labs/tests ordered: N

## 2023-02-01 ENCOUNTER — Other Ambulatory Visit: Payer: Self-pay | Admitting: Nurse Practitioner

## 2023-02-02 ENCOUNTER — Ambulatory Visit: Payer: Medicare Other | Admitting: Physician Assistant

## 2023-02-02 NOTE — Telephone Encounter (Signed)
This is a SNF patient  

## 2023-02-06 ENCOUNTER — Encounter: Payer: Self-pay | Admitting: Physician Assistant

## 2023-02-06 ENCOUNTER — Ambulatory Visit (INDEPENDENT_AMBULATORY_CARE_PROVIDER_SITE_OTHER): Payer: Medicare Other | Admitting: Physician Assistant

## 2023-02-06 VITALS — BP 146/85 | HR 74

## 2023-02-06 DIAGNOSIS — Z466 Encounter for fitting and adjustment of urinary device: Secondary | ICD-10-CM

## 2023-02-06 DIAGNOSIS — R339 Retention of urine, unspecified: Secondary | ICD-10-CM | POA: Diagnosis not present

## 2023-02-06 NOTE — Progress Notes (Signed)
Suprapubic Cath Change  Patient is present today for a suprapubic catheter change due to urinary retention.  9ml of water was drained from the balloon, a 18 FR  siliastic foley cath was removed from the tract with out difficulty.  Site was cleaned and prepped in a sterile fashion with betadine.  A 18 FR siliastic  foley cath was replaced into the tract no complications were noted. Urine return was noted, 10 ml of sterile water was inflated into the balloon and a foley bag was attached for drainage.  Patient tolerated well. A night bag was given to patient and proper instruction was given on how to switch bags.  Extra stat locks was given, as well.  Performed by: Maryland Pink RMA  Follow up: F/UP in 1 mth spt change

## 2023-03-01 LAB — BASIC METABOLIC PANEL
BUN: 7 (ref 4–21)
CO2: 28 — AB (ref 13–22)
Chloride: 99 (ref 99–108)
Creatinine: 0.8 (ref 0.5–1.1)
Glucose: 104
Potassium: 4.3 meq/L (ref 3.5–5.1)
Sodium: 135 — AB (ref 137–147)

## 2023-03-01 LAB — COMPREHENSIVE METABOLIC PANEL
Calcium: 9.3 (ref 8.7–10.7)
eGFR: 85

## 2023-03-01 LAB — CBC AND DIFFERENTIAL
Hemoglobin: 30 — AB (ref 12.0–16.0)
Neutrophils Absolute: 6208
Platelets: 358 10*3/uL (ref 150–400)
WBC: 8.4

## 2023-03-01 LAB — CBC: RBC: 4.29 (ref 3.87–5.11)

## 2023-03-09 ENCOUNTER — Ambulatory Visit (INDEPENDENT_AMBULATORY_CARE_PROVIDER_SITE_OTHER): Payer: Medicare Other | Admitting: Physician Assistant

## 2023-03-09 ENCOUNTER — Encounter: Payer: Self-pay | Admitting: Physician Assistant

## 2023-03-09 VITALS — BP 149/83 | HR 74

## 2023-03-09 DIAGNOSIS — R339 Retention of urine, unspecified: Secondary | ICD-10-CM

## 2023-03-09 DIAGNOSIS — Z466 Encounter for fitting and adjustment of urinary device: Secondary | ICD-10-CM

## 2023-03-09 NOTE — Progress Notes (Signed)
Suprapubic Cath Change  Patient is present today for a suprapubic catheter change due to urinary retention.  3ml of water was drained from the balloon, a 18FR Silastic foley cath was removed from the tract without difficulty.  Site was cleaned and prepped in a sterile fashion with betadine.  A 18FR Silastic foley cath was replaced into the tract no complications were noted. Urine return was noted, 10 ml of sterile water was inflated into the balloon and a night bag was attached for drainage.  Patient tolerated well.    Performed by: Carman Ching, PA-C, [place] Pink, CMA, DeShannon Smith, CMA, and [facility], CMA  Follow up: Return in about 4 weeks (around 04/06/2023) for SPT exchange.

## 2023-04-09 ENCOUNTER — Ambulatory Visit (INDEPENDENT_AMBULATORY_CARE_PROVIDER_SITE_OTHER): Payer: Medicare Other | Admitting: Physician Assistant

## 2023-04-09 DIAGNOSIS — R339 Retention of urine, unspecified: Secondary | ICD-10-CM

## 2023-04-09 DIAGNOSIS — Z466 Encounter for fitting and adjustment of urinary device: Secondary | ICD-10-CM

## 2023-04-09 NOTE — Progress Notes (Signed)
Suprapubic Cath Change  Patient is present today for a suprapubic catheter change due to urinary retention.  8ml of water was drained from the balloon, a 18FR Silastic foley cath was removed from the tract without difficulty.  Site was cleaned and prepped in a sterile fashion with betadine.  A 18FR Silastic foley cath was replaced into the tract no complications were noted. Urine return was noted, 10 ml of sterile water was inflated into the balloon and a night bag was attached for drainage.  Patient tolerated well.  Performed by: Carman Ching, PA-C and Domingo Cocking, CMA  Follow up: Return in about 4 weeks (around 05/07/2023) for SPT exchange.

## 2023-04-17 ENCOUNTER — Non-Acute Institutional Stay (SKILLED_NURSING_FACILITY): Payer: Medicare Other | Admitting: Nurse Practitioner

## 2023-04-17 ENCOUNTER — Encounter: Payer: Self-pay | Admitting: Nurse Practitioner

## 2023-04-17 DIAGNOSIS — F331 Major depressive disorder, recurrent, moderate: Secondary | ICD-10-CM

## 2023-04-17 DIAGNOSIS — I25118 Atherosclerotic heart disease of native coronary artery with other forms of angina pectoris: Secondary | ICD-10-CM

## 2023-04-17 DIAGNOSIS — E1169 Type 2 diabetes mellitus with other specified complication: Secondary | ICD-10-CM

## 2023-04-17 DIAGNOSIS — F22 Delusional disorders: Secondary | ICD-10-CM

## 2023-04-17 DIAGNOSIS — D509 Iron deficiency anemia, unspecified: Secondary | ICD-10-CM

## 2023-04-17 DIAGNOSIS — Z9359 Other cystostomy status: Secondary | ICD-10-CM

## 2023-04-17 DIAGNOSIS — I6930 Unspecified sequelae of cerebral infarction: Secondary | ICD-10-CM

## 2023-04-17 DIAGNOSIS — E782 Mixed hyperlipidemia: Secondary | ICD-10-CM

## 2023-04-17 DIAGNOSIS — I10 Essential (primary) hypertension: Secondary | ICD-10-CM

## 2023-04-17 LAB — PROTEIN / CREATININE RATIO, URINE
Albumin, U: 2.9
Creatinine, Urine: 7

## 2023-04-17 LAB — MICROALBUMIN / CREATININE URINE RATIO: Microalb Creat Ratio: 414

## 2023-04-17 NOTE — Progress Notes (Signed)
Location:  Other [facility].  Nursing Home Room Number: [facility] [address] Place of Service:  SNF [phone]) Abbey Chatters, NP  PCP: Earnestine Mealing, MD  Patient Care Team: Earnestine Mealing, MD as PCP - General (Family Medicine) Iran Ouch, MD as PCP - Cardiology (Cardiology)  Extended Emergency Contact Information Primary Emergency Contact: Penelope Galas of [place] Home Phone: [phone] Mobile Phone: [phone] Relation: Sister Secondary Emergency Contact: Barron Alvine Address: [address]          [place], [place] [address] Darden Amber of [place] Home Phone: [phone] Relation: Mother  Goals of care: Advanced Directive information    04/17/2023    3:10 PM  Advanced Directives  Does Patient Have a Medical Advance Directive? Yes  Type of Advance Directive Out of facility DNR (pink MOST or yellow form)  Does patient want to make changes to medical advance directive? No - Patient declined     Chief Complaint  Patient presents with   Medical Management of Chronic Issues    Medical Management of Chronic Issues.     HPI:  Pt is a 62 y.o. female seen today for medical management of chronic disease.  Pt with hx of CVA, MI, hyperlipidemia, delusional disorder.  She has been doing well recently  Last week had assisted fall to floor no injury Reports today is a good day.  She states she will not take cholesterol medication.  She has gained weight   Past Medical History:  Diagnosis Date   Acute ST elevation myocardial infarction (STEMI) of inferolateral wall (HCC) 01/25/2020   Blind    Cholecystitis 04/22/2021   History of migraine headaches    Hyperlipemia    Myocardial infarction [facility])    Stroke [facility])    Past Surgical History:  Procedure Laterality Date   ABDOMINAL HYSTERECTOMY     BACK SURGERY     CARDIAC CATHETERIZATION     CORONARY/GRAFT ACUTE MI REVASCULARIZATION N/A 01/25/2020   Procedure: Coronary/Graft Acute MI  Revascularization;  Surgeon: Iran Ouch, MD;  Location: [facility] INVASIVE CV LAB;  Service: Cardiovascular;  Laterality: N/A;   FRACTURE SURGERY     IR CATHETER TUBE CHANGE  08/10/2021   LEFT HEART CATH AND CORONARY ANGIOGRAPHY N/A 01/25/2020   Procedure: LEFT HEART CATH AND CORONARY ANGIOGRAPHY;  Surgeon: Iran Ouch, MD;  Location: [facility] INVASIVE CV LAB;  Service: Cardiovascular;  Laterality: N/A;   SKIN GRAFT Right    TOTAL ABDOMINAL HYSTERECTOMY Bilateral     Allergies  Allergen Reactions   Levaquin [Levofloxacin In D5w] Anaphylaxis and Swelling   Iodinated Contrast Media Itching    Severe itching   Other     Dust mites and opiates (all of them)   Latex Dermatitis    Outpatient Encounter Medications as of 04/17/2023  Medication Sig   aluminum hydroxide (DERMAGRAN) ointment Apply 1 application  topically daily as needed (skin protection). (Apply to suprapubic site)   aspirin EC 81 MG tablet Take 81 mg by mouth daily.   Azelastine HCl 0.15 % SOLN Place 2 sprays into both nostrils 2 (two) times daily.   Cholecalciferol (VITAMIN D3) 1.25 MG (50000 UT) CAPS Take 1 capsule by mouth once a week.  every Mon for Supplement   clonazePAM (KLONOPIN) 0.5 MG tablet Take 0.5 mg by mouth at bedtime.   diazepam (VALIUM) 10 MG tablet TAKE 1 TABLET BY MOUTH FOUR TIMES A DAY   guaiFENesin (ROBITUSSIN) 100 MG/5ML liquid Take 10 mLs by mouth  every 4 (four) hours as needed for cough or to loosen phlegm.   Hydrocortisone Acet, Perivag, (VAGISIL) 1 % CREA Apply topically. Apply to labia topically every 12 hours as needed   ibuprofen (ADVIL) 800 MG tablet Take 800 mg by mouth every 8 (eight) hours as needed for mild pain or moderate pain.   lansoprazole (PREVACID) 15 MG capsule Take 15 mg by mouth daily.   levothyroxine (SYNTHROID) 25 MCG tablet Take 25 mcg by mouth daily before breakfast.   magnesium citrate SOLN Take 1 Bottle by mouth as needed for severe constipation.   magnesium hydroxide (MILK  OF MAGNESIA) 400 MG/5ML suspension Take 5 mLs by mouth every 6 (six) hours as needed for mild constipation.   menthol-cetylpyridinium (CEPACOL) 3 MG lozenge Take 1 lozenge by mouth every 6 (six) hours as needed for sore throat.   nitroGLYCERIN (NITROSTAT) 0.4 MG SL tablet Place 0.4 mg under the tongue every 5 (five) minutes x 3 doses as needed for chest pain.    nystatin-triamcinolone ointment (MYCOLOG) Apply 1 Application topically 2 (two) times daily as needed (yeast). (Apply to perineal area)   OLANZapine (ZYPREXA) 5 MG tablet Take 5 mg by mouth 2 (two) times daily.   ondansetron (ZOFRAN-ODT) 4 MG disintegrating tablet Take 4 mg by mouth every 8 (eight) hours as needed for nausea or vomiting.   polyethylene glycol (MIRALAX / GLYCOLAX) 17 g packet Take 17 g by mouth daily.   senna-docusate (SENOKOT-S) 8.6-50 MG tablet Take 2 tablets by mouth at bedtime.   tiZANidine (ZANAFLEX) 2 MG tablet Take 2 mg by mouth every 8 (eight) hours as needed for muscle spasms.   Vibegron (GEMTESA) 75 MG TABS Take 75 mg by mouth daily.   [DISCONTINUED] clonazePAM (KLONOPIN) 1 MG tablet Take 0.5 tablets (0.5 mg total) by mouth 2 (two) times daily as needed for anxiety.   No facility-administered encounter medications on file as of 04/17/2023.    Review of Systems  Constitutional:  Negative for activity change, appetite change, fatigue and unexpected weight change.  HENT:  Negative for congestion and hearing loss.   Eyes:        Blind   Respiratory:  Negative for cough and shortness of breath.   Cardiovascular:  Negative for chest pain, palpitations and leg swelling.  Gastrointestinal:  Negative for abdominal pain, constipation and diarrhea.  Genitourinary:  Positive for difficulty urinating (suprapubic cath).  Musculoskeletal:  Negative for arthralgias and myalgias.  Skin:  Negative for color change and wound.  Neurological:  Negative for dizziness and weakness.  Psychiatric/Behavioral:  Positive for  behavioral problems. Negative for agitation and confusion. The patient is nervous/anxious.      Immunization History  Administered Date(s) Administered   Influenza-Unspecified 04/28/2011, 06/14/2012   Moderna Sars-Covid-2 Vaccination 09/08/2019, 10/06/2019, 06/11/2020   Tdap 09/03/2013, 11/15/2016   Zoster Recombinant(Shingrix) 12/08/2022, 03/10/2023   Pertinent  Health Maintenance Due  Topic Date Due   OPHTHALMOLOGY EXAM  Never done   Colonoscopy  Never done   HEMOGLOBIN A1C  06/02/2023   FOOT EXAM  04/16/2024   MAMMOGRAM  11/29/2024   INFLUENZA VACCINE  Discontinued      11/26/2021    2:10 AM 02/09/2022    1:51 AM 03/01/2022    7:43 AM 05/28/2022    2:13 AM 12/07/2022    2:19 PM  Fall Risk  Falls in the past year?     Exclusion - non ambulatory  (RETIRED) Patient Fall Risk Level High fall risk Moderate fall risk  Moderate fall risk Moderate fall risk    Functional Status Survey:    Vitals:   04/17/23 1501 04/17/23 1510  BP: (!) 147/85 133/77  Pulse: 97   Resp: 19   Temp: (!) 97.4 F (36.3 C)   SpO2: 98%   Weight: 184 lb 9.6 oz (83.7 kg)   Height: 5\' 1"  (1.549 m)    Body mass index is 34.88 kg/m. Physical Exam Constitutional:      General: She is not in acute distress.    Appearance: She is well-developed. She is not diaphoretic.  HENT:     Head: Normocephalic and atraumatic.     Mouth/Throat:     Pharynx: No oropharyngeal exudate.  Eyes:     Conjunctiva/sclera: Conjunctivae normal.     Pupils: Pupils are equal, round, and reactive to light.  Cardiovascular:     Rate and Rhythm: Normal rate and regular rhythm.     Heart sounds: Normal heart sounds.  Pulmonary:     Effort: Pulmonary effort is normal.     Breath sounds: Normal breath sounds.  Abdominal:     General: Bowel sounds are normal.     Palpations: Abdomen is soft.  Musculoskeletal:     Cervical back: Normal range of motion and neck supple.     Right lower leg: No edema.     Left lower leg: No  edema.  Skin:    General: Skin is warm and dry.  Neurological:     Mental Status: She is alert. Mental status is at baseline.     Comments: Left sided weakness    Psychiatric:        Mood and Affect: Mood normal.     Labs reviewed: Recent Labs    09/15/22 0252 09/16/22 0447 09/21/22 0000 11/30/22 0000 01/24/23 0307 03/01/23 0000  NA 135 136   < > 135* 132* 135*  K 3.2* 3.8   < > 4.1 3.4* 4.3  CL 101 102   < > 98* 98 99  CO2 23 25   < > 29* 24 28*  GLUCOSE 86 101*  --   --  178*  --   BUN 11 9   < > 8 8 7   CREATININE 0.83 0.79   < > 0.8 0.72 0.8  CALCIUM 7.9* 8.4*   < > 8.6* 8.2* 9.3   < > = values in this interval not displayed.   Recent Labs    06/01/22 0000 09/13/22 1016 09/21/22 0000  AST 12* 50* 13  ALT 6* 19 10  ALKPHOS 86 86 81  BILITOT  --  1.3*  --   PROT  --  7.8  --   ALBUMIN 3.8 4.1 4.1   Recent Labs    09/15/22 0252 09/16/22 0447 09/21/22 0000 11/29/22 0000 11/30/22 0000 01/24/23 0307 03/01/23 0000  WBC 7.6 7.2   < > 9.2 7.7 9.0 8.4  NEUTROABS 5.0 4.5  --  6,845.00 5,452.00  --  6,208.00  HGB 8.8* 9.4*   < > 9.0* 9.4* 9.3* 30.0*  HCT 29.6* 31.4*   < > 30* 31* 34.4*  --   MCV 70.1* 70.1*  --   --   --  76.8*  --   PLT 300 304   < > 356 399 283 358   < > = values in this interval not displayed.   Lab Results  Component Value Date   TSH 1.988 09/13/2022   Lab Results  Component Value Date  HGBA1C 6.4 11/30/2022   Lab Results  Component Value Date   CHOL 218 (A) 11/29/2022   HDL 64 11/29/2022   LDLCALC 135 11/29/2022   TRIG 89 11/29/2022   CHOLHDL 5.3 01/26/2020    Significant Diagnostic Results in last 30 days:  No results found.  Assessment/Plan 1. Type 2 diabetes mellitus with other specified complication, without long-term current use of insulin (HCC) -Encouraged dietary compliance, routine foot care/monitoring and to keep up with diabetic eye exams through ophthalmology  -urine microalbumin ordered  2. Essential  hypertension Stable at this time, encouraged dietary modifications  3. Coronary artery disease of native artery of native heart with stable angina pectoris (HCC) Stable without chest pains.   4. Suprapubic catheter (HCC) Stable, follows with urology for monthly changes   5. Mixed hyperlipidemia Denies statin despite ongoing education  6. Delusional disorder (HCC) Ongoing but stable on Zyprexa   7. Major depressive disorder, recurrent, moderate (HCC) Overall mood has improved, will continue current medications   8. Microcytic anemia follow cbc  9. Late effect of cerebrovascular accident (CVA) -without worsening of symptoms, she does not wish to be on medication for blood pressure or cholesterol. Continues on ASA dialy    Leveon Pelzer K. Biagio Borg [facility] [phone]

## 2023-05-07 ENCOUNTER — Ambulatory Visit (INDEPENDENT_AMBULATORY_CARE_PROVIDER_SITE_OTHER): Payer: Medicare Other | Admitting: Physician Assistant

## 2023-05-07 DIAGNOSIS — R339 Retention of urine, unspecified: Secondary | ICD-10-CM | POA: Diagnosis not present

## 2023-05-07 DIAGNOSIS — Z466 Encounter for fitting and adjustment of urinary device: Secondary | ICD-10-CM

## 2023-05-07 NOTE — Progress Notes (Signed)
Suprapubic Cath Change  Patient is present today for a suprapubic catheter change due to urinary retention.  8ml of water was drained from the balloon, a 18FR Silastic foley cath was removed from the tract without difficulty.  Site was cleaned and prepped in a sterile fashion with betadine.  A 18FR Silastic foley cath was replaced into the tract no complications were noted. Urine return was noted, 10 ml of sterile water was inflated into the balloon and a night bag was attached for drainage.  Patient tolerated well.    Performed by: Carman Ching, PA-C and Domingo Cocking, CMA  Follow up: Return in about 4 weeks (around 06/04/2023) for SPT exchange.

## 2023-05-18 ENCOUNTER — Encounter: Payer: Self-pay | Admitting: Student

## 2023-05-18 ENCOUNTER — Non-Acute Institutional Stay (SKILLED_NURSING_FACILITY): Payer: Medicare Other | Admitting: Student

## 2023-05-18 DIAGNOSIS — I5020 Unspecified systolic (congestive) heart failure: Secondary | ICD-10-CM

## 2023-05-18 DIAGNOSIS — F22 Delusional disorders: Secondary | ICD-10-CM

## 2023-05-18 DIAGNOSIS — F514 Sleep terrors [night terrors]: Secondary | ICD-10-CM

## 2023-05-18 DIAGNOSIS — I6930 Unspecified sequelae of cerebral infarction: Secondary | ICD-10-CM

## 2023-05-18 DIAGNOSIS — Z9359 Other cystostomy status: Secondary | ICD-10-CM | POA: Diagnosis not present

## 2023-05-18 DIAGNOSIS — I10 Essential (primary) hypertension: Secondary | ICD-10-CM

## 2023-05-18 DIAGNOSIS — E1169 Type 2 diabetes mellitus with other specified complication: Secondary | ICD-10-CM

## 2023-05-18 DIAGNOSIS — F331 Major depressive disorder, recurrent, moderate: Secondary | ICD-10-CM | POA: Diagnosis not present

## 2023-05-18 NOTE — Progress Notes (Unsigned)
Location:  [facility].  Nursing Home Room Number: [facility] [address] Place of Service:  SNF [phone]) Provider:  Earnestine Mealing, MD  Patient Care Team: Earnestine Mealing, MD as PCP - General (Family Medicine) Iran Ouch, MD as PCP - Cardiology (Cardiology)  Extended Emergency Contact Information Primary Emergency Contact: Penelope Galas of [place] Home Phone: [phone] Mobile Phone: [phone] Relation: Sister Secondary Emergency Contact: Barron Alvine Address: [address]          [place], [place] [address] Darden Amber of [place] Home Phone: [phone] Relation: Mother  Code Status:  DNR Goals of care: Advanced Directive information    05/18/2023   10:03 AM  Advanced Directives  Does Patient Have a Medical Advance Directive? Yes  Type of Advance Directive Out of facility DNR (pink MOST or yellow form)  Does patient want to make changes to medical advance directive? No - Patient declined     Chief Complaint  Patient presents with   Medical Management of Chronic Issues    Medical Management of Chronic Issues.     HPI:  Pt is a 62 y.o. female seen today for medical management of chronic diseases.    Patient made request to nurse regarding alcohol intake.  She states she is drink alcohol since she was 62 years old.  She is to slip her father's bourbon as a child.  Her alcohol intake is fluctuated over the years.  Per chart review she has had 2-3 drinks daily for many years.  She states she only drinks 1 type of scotch.  She is requested that her sister bring it in so she can continue discussed risk and benefit of adding this medication to her current regimen including nightly clonazepam and Valium as well as muscle relaxers.  Patient states that this time she would like to work on discontinuation of clonazepam.  Discussed with nursing and pharmacy dose reduction of clonazepam. Past Medical History:  Diagnosis Date   Acute ST  elevation myocardial infarction (STEMI) of inferolateral wall (HCC) 01/25/2020   Blind    Cholecystitis 04/22/2021   History of migraine headaches    Hyperlipemia    Myocardial infarction [facility])    Stroke [facility])    Past Surgical History:  Procedure Laterality Date   ABDOMINAL HYSTERECTOMY     BACK SURGERY     CARDIAC CATHETERIZATION     CORONARY/GRAFT ACUTE MI REVASCULARIZATION N/A 01/25/2020   Procedure: Coronary/Graft Acute MI Revascularization;  Surgeon: Iran Ouch, MD;  Location: [facility] INVASIVE CV LAB;  Service: Cardiovascular;  Laterality: N/A;   FRACTURE SURGERY     IR CATHETER TUBE CHANGE  08/10/2021   LEFT HEART CATH AND CORONARY ANGIOGRAPHY N/A 01/25/2020   Procedure: LEFT HEART CATH AND CORONARY ANGIOGRAPHY;  Surgeon: Iran Ouch, MD;  Location: [facility] INVASIVE CV LAB;  Service: Cardiovascular;  Laterality: N/A;   SKIN GRAFT Right    TOTAL ABDOMINAL HYSTERECTOMY Bilateral     Allergies  Allergen Reactions   Levaquin [Levofloxacin In D5w] Anaphylaxis and Swelling   Iodinated Contrast Media Itching    Severe itching   Other     Dust mites and opiates (all of them)   Latex Dermatitis    Outpatient Encounter Medications as of 05/18/2023  Medication Sig   aluminum hydroxide (DERMAGRAN) ointment Apply 1 application  topically daily as needed (skin protection). (Apply to suprapubic site)   aspirin EC 81 MG tablet Take 81 mg by mouth daily.   Azelastine  HCl 0.15 % SOLN Place 2 sprays into both nostrils 2 (two) times daily.   Cholecalciferol (VITAMIN D3) 1.25 MG (50000 UT) CAPS Take 1 capsule by mouth once a week.  every Mon for Supplement   clonazePAM (KLONOPIN) 0.5 MG tablet Take 0.5 mg by mouth at bedtime.   diazepam (VALIUM) 10 MG tablet TAKE 1 TABLET BY MOUTH FOUR TIMES A DAY   guaiFENesin (ROBITUSSIN) 100 MG/5ML liquid Take 10 mLs by mouth every 4 (four) hours as needed for cough or to loosen phlegm.   Hydrocortisone Acet, Perivag, (VAGISIL) 1 % CREA Apply  topically. Apply to labia topically every 12 hours as needed   ibuprofen (ADVIL) 800 MG tablet Take 800 mg by mouth every 8 (eight) hours as needed for mild pain or moderate pain.   lansoprazole (PREVACID) 15 MG capsule Take 15 mg by mouth daily.   levothyroxine (SYNTHROID) 25 MCG tablet Take 25 mcg by mouth daily before breakfast.   magnesium citrate SOLN Take 1 Bottle by mouth as needed for severe constipation.   magnesium hydroxide (MILK OF MAGNESIA) 400 MG/5ML suspension Take 5 mLs by mouth every 6 (six) hours as needed for mild constipation.   menthol-cetylpyridinium (CEPACOL) 3 MG lozenge Take 1 lozenge by mouth every 6 (six) hours as needed for sore throat.   nitroGLYCERIN (NITROSTAT) 0.4 MG SL tablet Place 0.4 mg under the tongue every 5 (five) minutes x 3 doses as needed for chest pain.    nystatin-triamcinolone ointment (MYCOLOG) Apply 1 Application topically 2 (two) times daily as needed (yeast). (Apply to perineal area)   OLANZapine (ZYPREXA) 5 MG tablet Take 5 mg by mouth 2 (two) times daily.   ondansetron (ZOFRAN-ODT) 4 MG disintegrating tablet Take 4 mg by mouth every 8 (eight) hours as needed for nausea or vomiting.   polyethylene glycol (MIRALAX / GLYCOLAX) 17 g packet Take 17 g by mouth daily.   senna-docusate (SENOKOT-S) 8.6-50 MG tablet Take 2 tablets by mouth at bedtime.   tiZANidine (ZANAFLEX) 2 MG tablet Take 2 mg by mouth every 8 (eight) hours as needed for muscle spasms.   Vibegron (GEMTESA) 75 MG TABS Take 75 mg by mouth daily.   No facility-administered encounter medications on file as of 05/18/2023.    Review of Systems  Immunization History  Administered Date(s) Administered   Influenza-Unspecified 04/28/2011, 06/14/2012   Moderna Sars-Covid-2 Vaccination 09/08/2019, 10/06/2019, 06/11/2020   Tdap 09/03/2013, 11/15/2016   Zoster Recombinant(Shingrix) 12/08/2022, 03/10/2023   Pertinent  Health Maintenance Due  Topic Date Due   OPHTHALMOLOGY EXAM  Never done    Colonoscopy  Never done   HEMOGLOBIN A1C  06/02/2023   FOOT EXAM  04/16/2024   MAMMOGRAM  11/29/2024   INFLUENZA VACCINE  Discontinued      11/26/2021    2:10 AM 02/09/2022    1:51 AM 03/01/2022    7:43 AM 05/28/2022    2:13 AM 12/07/2022    2:19 PM  Fall Risk  Falls in the past year?     Exclusion - non ambulatory  (RETIRED) Patient Fall Risk Level High fall risk Moderate fall risk Moderate fall risk Moderate fall risk    Functional Status Survey:    Vitals:   05/18/23 0954  BP: 138/79  Pulse: 78  Resp: 18  Temp: (!) 97.4 F (36.3 C)  SpO2: 98%  Weight: 183 lb 3.2 oz (83.1 kg)  Height: 5\' 1"  (1.549 m)   Body mass index is 34.62 kg/m. Physical Exam Eyes:  Comments: Disconjugate gaze  Cardiovascular:     Rate and Rhythm: Normal rate.     Pulses: Normal pulses.  Pulmonary:     Effort: Pulmonary effort is normal.     Breath sounds: Normal breath sounds.  Abdominal:     General: Abdomen is flat.     Palpations: Abdomen is soft.  Skin:    General: Skin is warm and dry.  Neurological:     General: No focal deficit present.     Mental Status: She is alert. Mental status is at baseline.  Psychiatric:        Mood and Affect: Mood normal.     Labs reviewed: Recent Labs    09/15/22 0252 09/16/22 0447 09/21/22 0000 11/30/22 0000 01/24/23 0307 03/01/23 0000  NA 135 136   < > 135* 132* 135*  K 3.2* 3.8   < > 4.1 3.4* 4.3  CL 101 102   < > 98* 98 99  CO2 23 25   < > 29* 24 28*  GLUCOSE 86 101*  --   --  178*  --   BUN 11 9   < > 8 8 7   CREATININE 0.83 0.79   < > 0.8 0.72 0.8  CALCIUM 7.9* 8.4*   < > 8.6* 8.2* 9.3   < > = values in this interval not displayed.   Recent Labs    06/01/22 0000 09/13/22 1016 09/21/22 0000  AST 12* 50* 13  ALT 6* 19 10  ALKPHOS 86 86 81  BILITOT  --  1.3*  --   PROT  --  7.8  --   ALBUMIN 3.8 4.1 4.1   Recent Labs    09/15/22 0252 09/16/22 0447 09/21/22 0000 11/29/22 0000 11/30/22 0000 01/24/23 0307 03/01/23 0000   WBC 7.6 7.2   < > 9.2 7.7 9.0 8.4  NEUTROABS 5.0 4.5  --  6,845.00 5,452.00  --  6,208.00  HGB 8.8* 9.4*   < > 9.0* 9.4* 9.3* 30.0*  HCT 29.6* 31.4*   < > 30* 31* 34.4*  --   MCV 70.1* 70.1*  --   --   --  76.8*  --   PLT 300 304   < > 356 399 283 358   < > = values in this interval not displayed.   Lab Results  Component Value Date   TSH 1.988 09/13/2022   Lab Results  Component Value Date   HGBA1C 6.4 11/30/2022   Lab Results  Component Value Date   CHOL 218 (A) 11/29/2022   HDL 64 11/29/2022   LDLCALC 135 11/29/2022   TRIG 89 11/29/2022   CHOLHDL 5.3 01/26/2020    Significant Diagnostic Results in last 30 days:  No results found.  Assessment/Plan Suprapubic catheter (HCC)  Major depressive disorder, recurrent, moderate (HCC)  Late effect of cerebrovascular accident (CVA)  HFrEF (heart failure with reduced ejection fraction) (HCC)  Type 2 diabetes mellitus with other specified complication, without long-term current use of insulin (HCC)  Essential hypertension  Delusional disorder (HCC) Patient with remote history of stroke and requires suprapubic catheter as a result of bladder spasms.  Changed monthly with urology.  Also as result of stroke she is blindness and muscle spasms.  Continue tizanidine and Valium.  Discussed decreasing Klonopin to 0.25 mg nightly x 2 weeks then as needed x 2 weeks then discontinue.  Okay to start 1 to 2 ounces of liquor daily as needed per patient request.  Discussed risk of  side effects with combination of alcohol and current medications.  Patient accepts risks and hopes to decrease risk with discontinuation of clonazepam.  Appears euvolemic on exam.  Weight stable at this time.  Diabetes diet controlled.  BP well-controlled.  Continue Zyprexa for delusional disorder.  Consider dose reduction in the next 6 months if patient remains stable.  Family/ staff Communication: nursing  Labs/tests ordered:  none

## 2023-05-19 MED ORDER — CLONAZEPAM 0.5 MG PO TABS
0.2500 mg | ORAL_TABLET | Freq: Every day | ORAL | 0 refills | Status: DC
Start: 2023-05-19 — End: 2023-11-06

## 2023-06-04 ENCOUNTER — Ambulatory Visit (INDEPENDENT_AMBULATORY_CARE_PROVIDER_SITE_OTHER): Payer: Medicare Other | Admitting: Physician Assistant

## 2023-06-04 ENCOUNTER — Encounter: Payer: Self-pay | Admitting: Physician Assistant

## 2023-06-04 DIAGNOSIS — Z466 Encounter for fitting and adjustment of urinary device: Secondary | ICD-10-CM

## 2023-06-04 DIAGNOSIS — R339 Retention of urine, unspecified: Secondary | ICD-10-CM | POA: Diagnosis not present

## 2023-06-04 LAB — HEPATIC FUNCTION PANEL
ALT: 7 U/L (ref 7–35)
AST: 10 — AB (ref 13–35)
Alkaline Phosphatase: 91 (ref 25–125)
Bilirubin, Total: 0.3

## 2023-06-04 LAB — CBC: RBC: 4.55 (ref 3.87–5.11)

## 2023-06-04 LAB — BASIC METABOLIC PANEL
BUN: 4 (ref 4–21)
CO2: 28 — AB (ref 13–22)
Chloride: 98 — AB (ref 99–108)
Creatinine: 0.8 (ref 0.5–1.1)
Glucose: 120
Potassium: 4.3 meq/L (ref 3.5–5.1)
Sodium: 133 — AB (ref 137–147)

## 2023-06-04 LAB — CBC AND DIFFERENTIAL
HCT: 33 — AB (ref 36–46)
Hemoglobin: 10.1 — AB (ref 12.0–16.0)
Neutrophils Absolute: 8143
WBC: 10.8

## 2023-06-04 LAB — TSH: TSH: 2.44 (ref 0.41–5.90)

## 2023-06-04 LAB — COMPREHENSIVE METABOLIC PANEL
Albumin: 3.8 (ref 3.5–5.0)
Calcium: 8.9 (ref 8.7–10.7)
Globulin: 2.6
eGFR: 79

## 2023-06-04 LAB — HEMOGLOBIN A1C: Hemoglobin A1C: 6.5

## 2023-06-04 NOTE — Progress Notes (Signed)
Suprapubic Cath Change  Patient is present today for a suprapubic catheter change due to urinary retention.  8ml of water was drained from the balloon, a 18FR Silastic foley cath was removed from the tract without difficulty.  Site was cleaned and prepped in a sterile fashion with betadine.  A 18FR Silastic foley cath was replaced into the tract no complications were noted. Urine return was noted, 10 ml of sterile water was inflated into the balloon and a night bag was attached for drainage.  Patient tolerated well.     Performed by: Carman Ching, PA-C and Ples Specter, CMA  Follow up: Return in about 4 weeks (around 07/02/2023) for SPT exchange.

## 2023-07-10 ENCOUNTER — Ambulatory Visit: Payer: Medicare Other | Admitting: Physician Assistant

## 2023-07-12 ENCOUNTER — Ambulatory Visit (INDEPENDENT_AMBULATORY_CARE_PROVIDER_SITE_OTHER): Payer: Medicare Other | Admitting: Physician Assistant

## 2023-07-12 DIAGNOSIS — Z435 Encounter for attention to cystostomy: Secondary | ICD-10-CM

## 2023-07-12 DIAGNOSIS — R339 Retention of urine, unspecified: Secondary | ICD-10-CM

## 2023-07-12 NOTE — Progress Notes (Signed)
Suprapubic Cath Change  Patient is present today for a suprapubic catheter change due to urinary retention.  8ml of water was drained from the balloon, a 18FR Silastic foley cath was removed from the tract without difficulty.  Site was cleaned and prepped in a sterile fashion with betadine.  A 18FR silicone coude foley cath was replaced into the tract no complications were noted. Urine return was noted, 10 ml of sterile water was inflated into the balloon and a night bag was attached for drainage.  Patient tolerated well.     Performed by: Carman Ching, PA-C and Randa Lynn, CMA  Additional notes: We are out of stock on her usual 18Fr Silastic catheters. I notified the patient that we would use an 18Fr silicone catheter instead today and plan to switch her back next month, as I anticipate we will have Silastic catheters back in stock by then.  Follow up: Return in about 4 weeks (around 08/09/2023) for SPT exchange.

## 2023-07-17 ENCOUNTER — Encounter: Payer: Self-pay | Admitting: Student

## 2023-07-19 ENCOUNTER — Encounter: Payer: Self-pay | Admitting: Nurse Practitioner

## 2023-07-19 ENCOUNTER — Non-Acute Institutional Stay (SKILLED_NURSING_FACILITY): Payer: Medicare Other | Admitting: Nurse Practitioner

## 2023-07-19 DIAGNOSIS — I10 Essential (primary) hypertension: Secondary | ICD-10-CM

## 2023-07-19 DIAGNOSIS — I25118 Atherosclerotic heart disease of native coronary artery with other forms of angina pectoris: Secondary | ICD-10-CM

## 2023-07-19 DIAGNOSIS — F22 Delusional disorders: Secondary | ICD-10-CM | POA: Diagnosis not present

## 2023-07-19 DIAGNOSIS — E1169 Type 2 diabetes mellitus with other specified complication: Secondary | ICD-10-CM

## 2023-07-19 DIAGNOSIS — I6930 Unspecified sequelae of cerebral infarction: Secondary | ICD-10-CM

## 2023-07-19 DIAGNOSIS — Z9359 Other cystostomy status: Secondary | ICD-10-CM

## 2023-07-19 DIAGNOSIS — E782 Mixed hyperlipidemia: Secondary | ICD-10-CM

## 2023-07-19 NOTE — Progress Notes (Signed)
Location:  Other [place].  Nursing Home Room Number: [facility] [address]A Place of Service:  SNF [id number]) Abbey Chatters, NP  PCP: Earnestine Mealing, MD  Patient Care Team: Earnestine Mealing, MD as PCP - General (Family Medicine) Iran Ouch, MD as PCP - Cardiology (Cardiology)  Extended Emergency Contact Information Primary Emergency Contact: Penelope Galas of [place] Home Phone: [phone] Mobile Phone: [phone] Relation: Sister Secondary Emergency Contact: Barron Alvine Address: [address]          [place], [place] [address] Darden Amber of [place] Home Phone: [phone] Relation: Mother  Goals of care: Advanced Directive information    07/19/2023    9:12 AM  Advanced Directives  Does Patient Have a Medical Advance Directive? Yes  Type of Advance Directive Out of facility DNR (pink MOST or yellow form)  Does patient want to make changes to medical advance directive? No - Patient declined     Chief Complaint  Patient presents with   Medical Management of Chronic Issues    Medical Management of Chronic Issues.     HPI:  Pt is a 62 y.o. female seen today for medical management of chronic disease.  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  She has been doing well  She recently stop clonazepam due to requesting alcohol. She request 1 shot of bourbon occasionally.  No adverse effects noted.  Bowel moving well.  No pain.    Past Medical History:  Diagnosis Date   Acute ST elevation myocardial infarction (STEMI) of inferolateral wall (HCC) 01/25/2020   Blind    Cholecystitis 04/22/2021   History of migraine headaches    Hyperlipemia    Myocardial infarction [facility])    Stroke [facility])    Past Surgical History:  Procedure Laterality Date   ABDOMINAL HYSTERECTOMY     BACK SURGERY     CARDIAC CATHETERIZATION      CORONARY/GRAFT ACUTE MI REVASCULARIZATION N/A 01/25/2020   Procedure: Coronary/Graft Acute MI Revascularization;  Surgeon: Iran Ouch, MD;  Location: [facility] INVASIVE CV LAB;  Service: Cardiovascular;  Laterality: N/A;   FRACTURE SURGERY     IR CATHETER TUBE CHANGE  08/10/2021   LEFT HEART CATH AND CORONARY ANGIOGRAPHY N/A 01/25/2020   Procedure: LEFT HEART CATH AND CORONARY ANGIOGRAPHY;  Surgeon: Iran Ouch, MD;  Location: [facility] INVASIVE CV LAB;  Service: Cardiovascular;  Laterality: N/A;   SKIN GRAFT Right    TOTAL ABDOMINAL HYSTERECTOMY Bilateral     Allergies  Allergen Reactions   Levaquin [Levofloxacin In D5w] Anaphylaxis and Swelling   Iodinated Contrast Media Itching    Severe itching   Other     Dust mites and opiates (all of them)   Latex Dermatitis    Outpatient Encounter Medications as of 07/19/2023  Medication Sig   aspirin EC 81 MG tablet Take 81 mg by mouth daily.   Azelastine HCl 0.15 % SOLN Place 2 sprays into both nostrils 2 (two) times daily.   Cholecalciferol (VITAMIN D3) 1.25 MG (50000 UT) CAPS Take 1 capsule by mouth once a week.  every Mon for Supplement   diazepam (VALIUM) 10 MG tablet TAKE 1 TABLET BY MOUTH FOUR TIMES A DAY   ibuprofen (ADVIL) 800 MG tablet Take 800 mg by mouth every 8 (eight) hours as needed for mild pain or moderate pain.   lansoprazole (PREVACID) 15 MG capsule Take 15 mg by mouth daily.  levothyroxine (SYNTHROID) 25 MCG tablet Take 25 mcg by mouth daily before breakfast.   magnesium hydroxide (MILK OF MAGNESIA) 400 MG/5ML suspension Take 5 mLs by mouth every 6 (six) hours as needed for mild constipation.   nitroGLYCERIN (NITROSTAT) 0.4 MG SL tablet Place 0.4 mg under the tongue every 5 (five) minutes x 3 doses as needed for chest pain.    OLANZapine (ZYPREXA) 5 MG tablet Take 5 mg by mouth 2 (two) times daily.   ondansetron (ZOFRAN-ODT) 4 MG disintegrating tablet Take 4 mg by mouth every 8 (eight) hours as needed for nausea or  vomiting.   polyethylene glycol (MIRALAX / GLYCOLAX) 17 g packet Take 17 g by mouth daily.   senna-docusate (SENOKOT-S) 8.6-50 MG tablet Take 2 tablets by mouth at bedtime.   sertraline (ZOLOFT) 25 MG tablet Take 25 mg by mouth daily.   Vibegron (GEMTESA) 75 MG TABS Take 75 mg by mouth daily.   aluminum hydroxide (DERMAGRAN) ointment Apply 1 application  topically daily as needed (skin protection). (Apply to suprapubic site) (Patient not taking: Reported on 07/19/2023)   clonazePAM (KLONOPIN) 0.5 MG tablet Take 0.5 tablets (0.25 mg total) by mouth at bedtime for 28 days. After 2 weeks, make PRN for 2 weeks then discontinue   guaiFENesin (ROBITUSSIN) 100 MG/5ML liquid Take 10 mLs by mouth every 4 (four) hours as needed for cough or to loosen phlegm. (Patient not taking: Reported on 07/19/2023)   Hydrocortisone Acet, Perivag, (VAGISIL) 1 % CREA Apply topically. Apply to labia topically every 12 hours as needed (Patient not taking: Reported on 07/19/2023)   magnesium citrate SOLN Take 1 Bottle by mouth as needed for severe constipation. (Patient not taking: Reported on 07/19/2023)   menthol-cetylpyridinium (CEPACOL) 3 MG lozenge Take 1 lozenge by mouth every 6 (six) hours as needed for sore throat. (Patient not taking: Reported on 07/19/2023)   nystatin-triamcinolone ointment (MYCOLOG) Apply 1 Application topically 2 (two) times daily as needed (yeast). (Apply to perineal area) (Patient not taking: Reported on 07/19/2023)   tiZANidine (ZANAFLEX) 2 MG tablet Take 2 mg by mouth every 8 (eight) hours as needed for muscle spasms. (Patient not taking: Reported on 07/19/2023)   No facility-administered encounter medications on file as of 07/19/2023.    Review of Systems  Constitutional:  Negative for chills and fever.  HENT:  Negative for tinnitus.   Respiratory:  Negative for cough and shortness of breath.   Cardiovascular:  Negative for chest pain, palpitations and leg swelling.  Gastrointestinal:   Negative for abdominal pain, constipation and diarrhea.  Genitourinary:        Supra pubic   Musculoskeletal:  Positive for myalgias. Negative for back pain.  Skin: Negative.   Neurological:  Negative for dizziness and headaches.     Immunization History  Administered Date(s) Administered   Influenza-Unspecified 04/28/2011, 06/14/2012   Moderna Sars-Covid-2 Vaccination 09/08/2019, 10/06/2019, 06/11/2020   Tdap 09/03/2013, 11/15/2016   Zoster Recombinant(Shingrix) 12/08/2022, 03/10/2023   Pertinent  Health Maintenance Due  Topic Date Due   OPHTHALMOLOGY EXAM  Never done   Colonoscopy  Never done   HEMOGLOBIN A1C  12/02/2023   FOOT EXAM  04/16/2024   MAMMOGRAM  11/29/2024   INFLUENZA VACCINE  Discontinued      11/26/2021    2:10 AM 02/09/2022    1:51 AM 03/01/2022    7:43 AM 05/28/2022    2:13 AM 12/07/2022    2:19 PM  Fall Risk  Falls in the past year?  Exclusion - non ambulatory  (RETIRED) Patient Fall Risk Level High fall risk Moderate fall risk Moderate fall risk Moderate fall risk    Functional Status Survey:    Vitals:   07/19/23 0856 07/19/23 1151  BP: (!) 142/90 (!) 142/90  Pulse: 66   Resp: 18   Temp: (!) 97.3 F (36.3 C)   SpO2: 92%   Weight: 185 lb (83.9 kg)   Height: 5\' 1"  (1.549 m)    Body mass index is 34.96 kg/m. Physical Exam Constitutional:      General: She is not in acute distress.    Appearance: She is well-developed. She is not diaphoretic.  HENT:     Head: Normocephalic and atraumatic.     Mouth/Throat:     Pharynx: No oropharyngeal exudate.  Eyes:     Conjunctiva/sclera: Conjunctivae normal.     Pupils: Pupils are equal, round, and reactive to light.  Cardiovascular:     Rate and Rhythm: Normal rate and regular rhythm.     Heart sounds: Normal heart sounds.  Pulmonary:     Effort: Pulmonary effort is normal.     Breath sounds: Normal breath sounds.  Abdominal:     General: Bowel sounds are normal.     Palpations: Abdomen is  soft.  Musculoskeletal:     Cervical back: Normal range of motion and neck supple.     Right lower leg: No edema.     Left lower leg: No edema.  Skin:    General: Skin is warm and dry.  Neurological:     Mental Status: She is alert. Mental status is at baseline.     Motor: Weakness present.     Gait: Gait abnormal.     Labs reviewed: Recent Labs    09/15/22 0252 09/16/22 0447 09/21/22 0000 01/24/23 0307 03/01/23 0000 06/04/23 0000  NA 135 136   < > 132* 135* 133*  K 3.2* 3.8   < > 3.4* 4.3 4.3  CL 101 102   < > 98 99 98*  CO2 23 25   < > 24 28* 28*  GLUCOSE 86 101*  --  178*  --   --   BUN 11 9   < > 8 7 4   CREATININE 0.83 0.79   < > 0.72 0.8 0.8  CALCIUM 7.9* 8.4*   < > 8.2* 9.3 8.9   < > = values in this interval not displayed.   Recent Labs    09/13/22 1016 09/21/22 0000 06/04/23 0000  AST 50* 13 10*  ALT 19 10 7   ALKPHOS 86 81 91  BILITOT 1.3*  --   --   PROT 7.8  --   --   ALBUMIN 4.1 4.1 3.8   Recent Labs    09/15/22 0252 09/16/22 0447 09/21/22 0000 11/30/22 0000 01/24/23 0307 03/01/23 0000 06/04/23 0000  WBC 7.6 7.2   < > 7.7 9.0 8.4 10.8  NEUTROABS 5.0 4.5   < > 5,452.00  --  6,208.00 8,143.00  HGB 8.8* 9.4*   < > 9.4* 9.3* 30.0* 10.1*  HCT 29.6* 31.4*   < > 31* 34.4*  --  33*  MCV 70.1* 70.1*  --   --  76.8*  --   --   PLT 300 304   < > 399 283 358  --    < > = values in this interval not displayed.   Lab Results  Component Value Date   TSH 2.44 06/04/2023  Lab Results  Component Value Date   HGBA1C 6.5 06/04/2023   Lab Results  Component Value Date   CHOL 218 (A) 11/29/2022   HDL 64 11/29/2022   LDLCALC 135 11/29/2022   TRIG 89 11/29/2022   CHOLHDL 5.3 01/26/2020    Significant Diagnostic Results in last 30 days:  No results found.  Assessment/Plan 1. Type 2 diabetes mellitus with other specified complication, without long-term current use of insulin (HCC) (Primary) -diet controlled, continue routine monitoring.   2.  Essential hypertension She refuses VS checks frequently, does not believe she has high blood pressure and does not want medications  3. Coronary artery disease of native artery of native heart with stable angina pectoris (HCC) No chest pains at this time. Continues on ASA   4. Delusional disorder (HCC) Stable,, Had follow up with psychiatrist at facility and no adjustment in medication was recommended.   Will continue current regimen  5. Mixed hyperlipidemia Declines statin. Will monitor and continue to educate  6. Suprapubic catheter [facility]) Continue with routine changes through urology  7. Late effect of cerebrovascular accident (CVA) Stable, continues on asa   Shakira Los K. [facility] [phone]

## 2023-08-16 ENCOUNTER — Ambulatory Visit: Payer: Medicare Other | Admitting: Physician Assistant

## 2023-08-21 ENCOUNTER — Ambulatory Visit (INDEPENDENT_AMBULATORY_CARE_PROVIDER_SITE_OTHER): Payer: Medicare Other | Admitting: Physician Assistant

## 2023-08-21 DIAGNOSIS — R339 Retention of urine, unspecified: Secondary | ICD-10-CM | POA: Diagnosis not present

## 2023-08-21 DIAGNOSIS — Z435 Encounter for attention to cystostomy: Secondary | ICD-10-CM

## 2023-08-21 NOTE — Progress Notes (Signed)
Suprapubic Cath Change  Patient is present today for a suprapubic catheter change due to urinary retention.  8ml of water was drained from the balloon, a 18FR silicone foley cath was removed from the tract with out difficulty.  Site was cleaned and prepped in a sterile fashion with betadine.  A 18FR Silastic foley cath was replaced into the tract no complications were noted. Urine return was noted, 10 ml of sterile water was inflated into the balloon and a night bag was attached for drainage.  Patient tolerated well. A night bag was given to patient and proper instruction was given on how to switch bags.    Performed by: Carman Ching, PA-C and Ples Specter, CMA  Follow up: Return in about 4 weeks (around 09/18/2023) for SPT exchange.

## 2023-08-24 ENCOUNTER — Other Ambulatory Visit: Payer: Self-pay | Admitting: Nurse Practitioner

## 2023-08-24 NOTE — Telephone Encounter (Signed)
This is a SNF resident, will send to [facility] providers for review

## 2023-09-03 LAB — CBC AND DIFFERENTIAL
HCT: 31 — AB (ref 36–46)
Hemoglobin: 9.3 — AB (ref 12.0–16.0)
Neutrophils Absolute: 7929
Platelets: 393 10*3/uL (ref 150–400)
WBC: 10.6

## 2023-09-03 LAB — BASIC METABOLIC PANEL
BUN: 5 (ref 4–21)
CO2: 28 — AB (ref 13–22)
Chloride: 100 (ref 99–108)
Creatinine: 0.8 (ref 0.5–1.1)
Glucose: 110
Potassium: 4.7 meq/L (ref 3.5–5.1)
Sodium: 135 — AB (ref 137–147)

## 2023-09-03 LAB — COMPREHENSIVE METABOLIC PANEL
Calcium: 8.8 (ref 8.7–10.7)
eGFR: 83

## 2023-09-03 LAB — CBC: RBC: 4.33 (ref 3.87–5.11)

## 2023-09-10 ENCOUNTER — Non-Acute Institutional Stay (SKILLED_NURSING_FACILITY): Payer: Self-pay | Admitting: Student

## 2023-09-10 DIAGNOSIS — I69354 Hemiplegia and hemiparesis following cerebral infarction affecting left non-dominant side: Secondary | ICD-10-CM | POA: Insufficient documentation

## 2023-09-10 DIAGNOSIS — E1169 Type 2 diabetes mellitus with other specified complication: Secondary | ICD-10-CM

## 2023-09-10 DIAGNOSIS — F22 Delusional disorders: Secondary | ICD-10-CM | POA: Diagnosis not present

## 2023-09-10 DIAGNOSIS — I69359 Hemiplegia and hemiparesis following cerebral infarction affecting unspecified side: Secondary | ICD-10-CM | POA: Diagnosis not present

## 2023-09-10 DIAGNOSIS — Z9359 Other cystostomy status: Secondary | ICD-10-CM

## 2023-09-10 DIAGNOSIS — I5020 Unspecified systolic (congestive) heart failure: Secondary | ICD-10-CM | POA: Diagnosis not present

## 2023-09-10 NOTE — Progress Notes (Signed)
Location:  Other Nursing Home Room Number: [facility] [address] Place of Service:  SNF [id number]) Provider:  Ander Gaster, Benetta Spar, MD  Patient Care Team: Earnestine Mealing, MD as PCP - General (Family Medicine) Iran Ouch, MD as PCP - Cardiology (Cardiology)  Extended Emergency Contact Information Primary Emergency Contact: Penelope Galas of [place] Home Phone: [phone] Mobile Phone: [phone] Relation: Sister Secondary Emergency Contact: Barron Alvine Address: [address]          [place], [place] [address] Darden Amber of [place] Home Phone: [phone] Relation: Mother  Code Status:  DNR Goals of care: Advanced Directive information    07/19/2023    9:12 AM  Advanced Directives  Does Patient Have a Medical Advance Directive? Yes  Type of Advance Directive Out of facility DNR (pink MOST or yellow form)  Does patient want to make changes to medical advance directive? No - Patient declined     Chief Complaint  Patient presents with   Medical Management of Chronic Conditions    HPI:  Pt is a 63 y.o. female seen today for medical management of chronic diseases.   Discussed the use of AI scribe software for clinical note transcription with the patient, who gave verbal consent to proceed.  History of Present Illness       History of Present Illness The patient, with a history of heart attacks and strokes, presents with chest pain. She is accompanied by her sister, Victoria Medina, who is her power of attorney.  She experiences intermittent chest pain, which began over the weekend, described as severe and occurring during the day. The exact time of onset is unclear. Nitroglycerin provides complete relief within minutes. She has a history of similar episodes and has had multiple heart attacks in the past. She prefers pain management over hospitalization if nitroglycerin is ineffective after three doses.  She experiences confusion and visual  illusions, which she recognizes as tricks of the mind. She is oriented to time and date, confirming the current month, day, and year. Her sister, Victoria Medina, is her power of attorney and is aware of her medical wishes.  She expresses dissatisfaction with the food provided in her current living situation, preferring sushi, which she used to eat regularly. She has a limited appetite and interest in food.  She has a suprapubic catheter that is changed monthly.  She wishes to avoid unnecessary hospitalization and prefers comfort measures. Her sister, Victoria Medina, is designated to make decisions if she is unable to communicate. Contacted patient's sister Victoria Medina who is aware of patient's wishes and DNR status.  Social History - Enjoys eating sushi - Participates in activities at the hall - Sees a therapist once a week      Contacted sister to confirm wishes. Her QOL is so bad. Her mood has been better recently, but it's difficult to have calls with her because she can't vent. Her sister desires maintaining a level of control, she wants her to continue hospital if necessary. If it's chest pain, they would not want   Past Medical History:  Diagnosis Date   Acute ST elevation myocardial infarction (STEMI) of inferolateral wall (HCC) 01/25/2020   Blind    Cholecystitis 04/22/2021   History of migraine headaches    Hyperlipemia    Myocardial infarction (HCC)    Stroke [facility])    Past Surgical History:  Procedure Laterality Date   ABDOMINAL HYSTERECTOMY     BACK SURGERY     CARDIAC CATHETERIZATION  CORONARY/GRAFT ACUTE MI REVASCULARIZATION N/A 01/25/2020   Procedure: Coronary/Graft Acute MI Revascularization;  Surgeon: Iran Ouch, MD;  Location: [facility] INVASIVE CV LAB;  Service: Cardiovascular;  Laterality: N/A;   FRACTURE SURGERY     IR CATHETER TUBE CHANGE  08/10/2021   LEFT HEART CATH AND CORONARY ANGIOGRAPHY N/A 01/25/2020   Procedure: LEFT HEART CATH AND CORONARY ANGIOGRAPHY;  Surgeon:  Iran Ouch, MD;  Location: [facility] INVASIVE CV LAB;  Service: Cardiovascular;  Laterality: N/A;   SKIN GRAFT Right    TOTAL ABDOMINAL HYSTERECTOMY Bilateral     Allergies  Allergen Reactions   Levaquin [Levofloxacin In D5w] Anaphylaxis and Swelling   Iodinated Contrast Media Itching    Severe itching   Other     Dust mites and opiates (all of them)   Latex Dermatitis    Outpatient Encounter Medications as of 09/10/2023  Medication Sig   aspirin EC 81 MG tablet Take 81 mg by mouth daily.   Azelastine HCl 0.15 % SOLN Place 2 sprays into both nostrils 2 (two) times daily.   Cholecalciferol (VITAMIN D3) 1.25 MG (50000 UT) CAPS Take 1 capsule by mouth once a week.  every Mon for Supplement   clonazePAM (KLONOPIN) 0.5 MG tablet Take 0.5 tablets (0.25 mg total) by mouth at bedtime for 28 days. After 2 weeks, make PRN for 2 weeks then discontinue   diazepam (VALIUM) 10 MG tablet TAKE 1 TABLET BY MOUTH FOUR TIMES A DAY   ibuprofen (ADVIL) 800 MG tablet Take 800 mg by mouth every 8 (eight) hours as needed for mild pain or moderate pain.   lansoprazole (PREVACID) 15 MG capsule Take 15 mg by mouth daily.   levothyroxine (SYNTHROID) 25 MCG tablet Take 25 mcg by mouth daily before breakfast.   magnesium hydroxide (MILK OF MAGNESIA) 400 MG/5ML suspension Take 5 mLs by mouth every 6 (six) hours as needed for mild constipation.   nitroGLYCERIN (NITROSTAT) 0.4 MG SL tablet Place 0.4 mg under the tongue every 5 (five) minutes x 3 doses as needed for chest pain.    OLANZapine (ZYPREXA) 5 MG tablet Take 5 mg by mouth 2 (two) times daily.   ondansetron (ZOFRAN-ODT) 4 MG disintegrating tablet Take 4 mg by mouth every 8 (eight) hours as needed for nausea or vomiting.   polyethylene glycol (MIRALAX / GLYCOLAX) 17 g packet Take 17 g by mouth daily.   senna-docusate (SENOKOT-S) 8.6-50 MG tablet Take 2 tablets by mouth at bedtime.   sertraline (ZOLOFT) 25 MG tablet Take 25 mg by mouth daily.   Vibegron  (GEMTESA) 75 MG TABS Take 75 mg by mouth daily.   No facility-administered encounter medications on file as of 09/10/2023.    Review of Systems  Immunization History  Administered Date(s) Administered   Influenza-Unspecified 04/28/2011, 06/14/2012   Moderna Sars-Covid-2 Vaccination 09/08/2019, 10/06/2019, 06/11/2020   Tdap 09/03/2013, 11/15/2016   Zoster Recombinant(Shingrix) 12/08/2022, 03/10/2023   Pertinent  Health Maintenance Due  Topic Date Due   OPHTHALMOLOGY EXAM  Never done   Colonoscopy  Never done   HEMOGLOBIN A1C  12/02/2023   FOOT EXAM  04/16/2024   MAMMOGRAM  11/29/2024   INFLUENZA VACCINE  Discontinued      11/26/2021    2:10 AM 02/09/2022    1:51 AM 03/01/2022    7:43 AM 05/28/2022    2:13 AM 12/07/2022    2:19 PM  Fall Risk  Falls in the past year?     Exclusion - non ambulatory  (RETIRED)  Patient Fall Risk Level High fall risk Moderate fall risk Moderate fall risk Moderate fall risk    Functional Status Survey:    Vitals:   09/10/23 1032  BP: (!) 123/93  Pulse: 98  Resp: 20  Temp: 97.8 F (36.6 C)  SpO2: 96%  Weight: 186 lb (84.4 kg)   Body mass index is 35.14 kg/m. Physical Exam Physical Exam GENITOURINARY: Urine in catheter clear. SKIN: Suprapubic catheter site with normal appearance, no signs of infection.  Labs reviewed: Recent Labs    09/15/22 0252 09/16/22 0447 09/21/22 0000 01/24/23 0307 03/01/23 0000 06/04/23 0000  NA 135 136   < > 132* 135* 133*  K 3.2* 3.8   < > 3.4* 4.3 4.3  CL 101 102   < > 98 99 98*  CO2 23 25   < > 24 28* 28*  GLUCOSE 86 101*  --  178*  --   --   BUN 11 9   < > 8 7 4   CREATININE 0.83 0.79   < > 0.72 0.8 0.8  CALCIUM 7.9* 8.4*   < > 8.2* 9.3 8.9   < > = values in this interval not displayed.   Recent Labs    09/13/22 1016 09/21/22 0000 06/04/23 0000  AST 50* 13 10*  ALT 19 10 7   ALKPHOS 86 81 91  BILITOT 1.3*  --   --   PROT 7.8  --   --   ALBUMIN 4.1 4.1 3.8   Recent Labs    09/15/22 0252  09/16/22 0447 09/21/22 0000 11/30/22 0000 01/24/23 0307 03/01/23 0000 06/04/23 0000  WBC 7.6 7.2   < > 7.7 9.0 8.4 10.8  NEUTROABS 5.0 4.5   < > 5,452.00  --  6,208.00 8,143.00  HGB 8.8* 9.4*   < > 9.4* 9.3* 30.0* 10.1*  HCT 29.6* 31.4*   < > 31* 34.4*  --  33*  MCV 70.1* 70.1*  --   --  76.8*  --   --   PLT 300 304   < > 399 283 358  --    < > = values in this interval not displayed.   Lab Results  Component Value Date   TSH 2.44 06/04/2023   Lab Results  Component Value Date   HGBA1C 6.5 06/04/2023   Lab Results  Component Value Date   CHOL 218 (A) 11/29/2022   HDL 64 11/29/2022   LDLCALC 135 11/29/2022   TRIG 89 11/29/2022   CHOLHDL 5.3 01/26/2020    Significant Diagnostic Results in last 30 days:  No results found.  Assessment/Plan  Chest Pain Intermittent chest pain, likely angina, relieved by nitroglycerin. History of multiple myocardial infarctions and cerebrovascular accidents. Discussed protocol for taking up to three doses of nitroglycerin and the need to seek medical attention if pain persists. Prefers pain medication over hospital visits if nitroglycerin is ineffective. - Educate on taking up to three doses of nitroglycerin for chest pain - Discuss option of taking pain medication if nitroglycerin is ineffective after three doses  Cognitive Impairment Exhibits confusion and memory issues. Oriented to month and day but incorrect year. No hallucinations, but experiences visual illusions. Power of attorney is sister, Victoria Medina. - Contact [name] to confirm wishes and discuss care plan  Depression Expresses feelings of hopelessness and a desire not to prolong life. Dissatisfaction with current living situation and lack of meaningful activities. Limited access to therapy. - Ensure sees therapist as often as possible - Discuss with  staff the possibility of increasing therapy sessions - Explore options for meaningful activities, such as animal visits and  crafts  Nutritional Concerns Reports dissatisfaction with current food options and expresses a preference for sushi. Limited appetite noted. - Discuss with facility staff the possibility of providing preferred food options, such as sushi  General Health Maintenance Suprapubic catheter in good condition with clear urine. Catheter is changed monthly. - Continue monthly catheter changes  Goals of Care Prefers comfort measures and wishes to avoid hospitalizations unless necessary for pain control. Power of attorney is sister, Victoria Medina, who is aware of wishes. - Confirm wishes with Victoria Medina - Ensure care plan aligns with goals of comfort and quality of life.   Family/ staff Communication: [name], nursing  Labs/tests ordered:  Lipids and A1c are due in may

## 2023-09-11 ENCOUNTER — Encounter: Payer: Self-pay | Admitting: Student

## 2023-09-18 ENCOUNTER — Non-Acute Institutional Stay (SKILLED_NURSING_FACILITY): Payer: Self-pay | Admitting: Nurse Practitioner

## 2023-09-18 ENCOUNTER — Encounter: Payer: Self-pay | Admitting: Nurse Practitioner

## 2023-09-18 DIAGNOSIS — E782 Mixed hyperlipidemia: Secondary | ICD-10-CM | POA: Diagnosis not present

## 2023-09-18 DIAGNOSIS — I10 Essential (primary) hypertension: Secondary | ICD-10-CM | POA: Diagnosis not present

## 2023-09-18 DIAGNOSIS — I25110 Atherosclerotic heart disease of native coronary artery with unstable angina pectoris: Secondary | ICD-10-CM | POA: Diagnosis not present

## 2023-09-18 DIAGNOSIS — I5020 Unspecified systolic (congestive) heart failure: Secondary | ICD-10-CM

## 2023-09-18 MED ORDER — LOSARTAN POTASSIUM 25 MG PO TABS
25.0000 mg | ORAL_TABLET | Freq: Every day | ORAL | Status: DC
Start: 2023-09-18 — End: 2023-09-24

## 2023-09-18 NOTE — Progress Notes (Signed)
 Location:  Other Nursing Home Room Number: [facility] [address]A Place of Service:  SNF [phone])  Sydnee Cabal, [place], MD  Patient Care Team: Earnestine Mealing, MD as PCP - General (Family Medicine) Iran Ouch, MD as PCP - Cardiology (Cardiology)  Extended Emergency Contact Information Primary Emergency Contact: Schmuck Ripley [place] of [place] Home Phone: [phone] Mobile Phone: [phone] Relation: Sister Secondary Emergency Contact: Barron Alvine Address: [address]          [place], [place] [address] Darden Amber of [place] Home Phone: [phone] Relation: Mother  Goals of care: Advanced Directive information    07/19/2023    9:12 AM  Advanced Directives  Does Patient Have a Medical Advance Directive? Yes  Type of Advance Directive Out of facility DNR (pink MOST or yellow form)  Does patient want to make changes to medical advance directive? No - Patient declined     Chief Complaint  Patient presents with   Chest Pain    Chest Pain.     HPI:  Pt is a 63 y.o. female seen today for an acute visit for chest pain. PMH left-sided paralysis, hx of CVA,  blindness, STEMI.  Nursing reports she has had chest pain intermediately over the last weeks. Last episode was last night and  approximately the 3rd time this has occurred in the last week. Last episode 5:38pm on 2/17, which was relieved by 1 dose SL nitroglycerin. Vital signs at the time of event, BP 194/95, HR 87. Repeat BP 156/91. Random in nature. First episode 1 week ago took 3 ntg to resolve chest pain.  8/10 pain, throbbing and sharp at the same time. Pain radiates down bilateral arms (from shoulder to elbow). Reports nausea during these episodes.  Reports developing a headache after taking nitroglycerin, which resolved with ibuprofen. She reports significant hx of CAD and was seen by cardiologist in the past who reported to her she had significant blockages.  She has declined statin and  blood pressure medication in the past.  Reports if she dies "it is okay" she confirms DNR.   Past Medical History:  Diagnosis Date   Acute ST elevation myocardial infarction (STEMI) of inferolateral wall (HCC) 01/25/2020   Blind    Cholecystitis 04/22/2021   History of migraine headaches    Hyperlipemia    Myocardial infarction [facility])    Stroke [facility])    Past Surgical History:  Procedure Laterality Date   ABDOMINAL HYSTERECTOMY     BACK SURGERY     CARDIAC CATHETERIZATION     CORONARY/GRAFT ACUTE MI REVASCULARIZATION N/A 01/25/2020   Procedure: Coronary/Graft Acute MI Revascularization;  Surgeon: Iran Ouch, MD;  Location: [facility] INVASIVE CV LAB;  Service: Cardiovascular;  Laterality: N/A;   FRACTURE SURGERY     IR CATHETER TUBE CHANGE  08/10/2021   LEFT HEART CATH AND CORONARY ANGIOGRAPHY N/A 01/25/2020   Procedure: LEFT HEART CATH AND CORONARY ANGIOGRAPHY;  Surgeon: Iran Ouch, MD;  Location: [facility] INVASIVE CV LAB;  Service: Cardiovascular;  Laterality: N/A;   SKIN GRAFT Right    TOTAL ABDOMINAL HYSTERECTOMY Bilateral     Allergies  Allergen Reactions   Levaquin [Levofloxacin In D5w] Anaphylaxis and Swelling   Iodinated Contrast Media Itching    Severe itching   Other     Dust mites and opiates (all of them)   Latex Dermatitis    Outpatient Encounter Medications as of 09/18/2023  Medication Sig   aspirin EC 81 MG tablet Take 81 mg by  mouth daily.   Azelastine HCl 0.15 % SOLN Place 2 sprays into both nostrils 2 (two) times daily.   Cholecalciferol (VITAMIN D3) 1.25 MG (50000 UT) CAPS Take 1 capsule by mouth once a week.  every Mon for Supplement   diazepam (VALIUM) 10 MG tablet TAKE 1 TABLET BY MOUTH FOUR TIMES A DAY   ibuprofen (ADVIL) 800 MG tablet Take 800 mg by mouth every 8 (eight) hours as needed for mild pain or moderate pain.   lansoprazole (PREVACID) 15 MG capsule Take 15 mg by mouth daily.   levothyroxine (SYNTHROID) 25 MCG tablet Take 25 mcg by mouth  daily before breakfast.   magnesium hydroxide (MILK OF MAGNESIA) 400 MG/5ML suspension Take 5 mLs by mouth every 6 (six) hours as needed for mild constipation.   nitroGLYCERIN (NITROSTAT) 0.4 MG SL tablet Place 0.4 mg under the tongue every 5 (five) minutes x 3 doses as needed for chest pain.    OLANZapine (ZYPREXA) 5 MG tablet Take 2.5 mg by mouth 2 (two) times daily.   ondansetron (ZOFRAN-ODT) 4 MG disintegrating tablet Take 4 mg by mouth every 8 (eight) hours as needed for nausea or vomiting.   polyethylene glycol (MIRALAX / GLYCOLAX) 17 g packet Take 17 g by mouth daily.   senna-docusate (SENOKOT-S) 8.6-50 MG tablet Take 2 tablets by mouth at bedtime.   sertraline (ZOLOFT) 100 MG tablet Take 100 mg by mouth daily.   Vibegron (GEMTESA) 75 MG TABS Take 75 mg by mouth daily.   clonazePAM (KLONOPIN) 0.5 MG tablet Take 0.5 tablets (0.25 mg total) by mouth at bedtime for 28 days. After 2 weeks, make PRN for 2 weeks then discontinue   No facility-administered encounter medications on file as of 09/18/2023.    Review of Systems  Respiratory:  Positive for chest tightness. Negative for shortness of breath.   Cardiovascular:  Positive for chest pain.  Gastrointestinal:  Positive for nausea. Negative for vomiting.  Neurological:  Positive for weakness (left side from prior stroke) and headaches (after administration of nitroglycerin). Negative for dizziness and numbness.    Immunization History  Administered Date(s) Administered   Influenza-Unspecified 04/28/2011, 06/14/2012   Moderna Sars-Covid-2 Vaccination 09/08/2019, 10/06/2019, 06/11/2020   Tdap 09/03/2013, 11/15/2016   Zoster Recombinant(Shingrix) 12/08/2022, 03/10/2023   Pertinent  Health Maintenance Due  Topic Date Due   OPHTHALMOLOGY EXAM  Never done   Colonoscopy  Never done   HEMOGLOBIN A1C  12/02/2023   FOOT EXAM  04/16/2024   MAMMOGRAM  11/29/2024   INFLUENZA VACCINE  Discontinued      11/26/2021    2:10 AM 02/09/2022     1:51 AM 03/01/2022    7:43 AM 05/28/2022    2:13 AM 12/07/2022    2:19 PM  Fall Risk  Falls in the past year?     Exclusion - non ambulatory  (RETIRED) Patient Fall Risk Level High fall risk Moderate fall risk Moderate fall risk Moderate fall risk    Functional Status Survey:    Vitals:   09/18/23 1427 09/18/23 1434  BP: (!) 167/86 (!) 166/90  Pulse: 69   Resp: 18   Temp: 98.1 F (36.7 C)   SpO2: 98%   Weight: 186 lb (84.4 kg)   Height: 5\' 1"  (1.549 m)    Body mass index is 35.14 kg/m. Physical Exam Constitutional:      Appearance: She is obese.  Cardiovascular:     Rate and Rhythm: Normal rate and regular rhythm.     Heart  sounds: Normal heart sounds.  Pulmonary:     Effort: Pulmonary effort is normal.     Breath sounds: Normal breath sounds.  Skin:    General: Skin is warm and dry.  Neurological:     Mental Status: She is alert and oriented to person, place, and time.  Psychiatric:        Mood and Affect: Mood normal.     Comments: Pleasant during visit     Labs reviewed: Recent Labs    01/24/23 0307 03/01/23 0000 06/04/23 0000 09/03/23 0000  NA 132* 135* 133* 135*  K 3.4* 4.3 4.3 4.7  CL 98 99 98* 100  CO2 24 28* 28* 28*  GLUCOSE 178*  --   --   --   BUN 8 7 4 5   CREATININE 0.72 0.8 0.8 0.8  CALCIUM 8.2* 9.3 8.9 8.8   Recent Labs    09/21/22 0000 06/04/23 0000  AST 13 10*  ALT 10 7  ALKPHOS 81 91  ALBUMIN 4.1 3.8   Recent Labs    01/24/23 0307 03/01/23 0000 06/04/23 0000 09/03/23 0000  WBC 9.0 8.4 10.8 10.6  NEUTROABS  --  6,208.00 8,143.00 7,929.00  HGB 9.3* 30.0* 10.1* 9.3*  HCT 34.4*  --  33* 31*  MCV 76.8*  --   --   --   PLT 283 358  --  393   Lab Results  Component Value Date   TSH 2.44 06/04/2023   Lab Results  Component Value Date   HGBA1C 6.5 06/04/2023   Lab Results  Component Value Date   CHOL 218 (A) 11/29/2022   HDL 64 11/29/2022   LDLCALC 135 11/29/2022   TRIG 89 11/29/2022   CHOLHDL 5.3 01/26/2020     Significant Diagnostic Results in last 30 days:  No results found.  Assessment/Plan 1. Coronary artery disease involving native coronary artery of native heart with unstable angina pectoris (HCC) (Primary) -More frequent episodes of chest pain, relieved by nitroglycerin -Continue aspirin daily -Declines statins, states they are "bad for you," reports she has tried them in the past and they "don't work" for her and has side effects.  -Aware of the risks associated with inadequate cholesterol control -States she does not want to go to ED with chest pain episodes, aware of risks States she "has been down that road" and does NOT wish to go back.  -Patient agreeable to cardiology consult  2. Primary hypertension -Uncontrolled, consistently greater than 140/90 -After long discussion, patient agreeable to start losartan for blood pressure control, states she will notify nursing staff if she has undesirable side effects -Start 25mg  losartan daily  -Encouraged dietary modifications and physical activity as tolerated -in detail education provided on the need for proper blood pressure control.   3. Mixed hyperlipidemia -Declines statins and will not take despite education. -Encouraged dietary modifications and physical activity as tolerated  4. Obesity, morbid (HCC) -Encouraged dietary modifications and physical activity as tolerated -BMI >35 with CAD, HTN, HLD, CHF  5. HFrEF (heart failure with reduced ejection fraction) (HCC) -No signs of fluid overload, no shortness of breath, peripheral edema, or crackles -Encouraged dietary modifications and physical activity as tolerated  Rollen Sox, Haroldine Laws MSN-FNP Student  -Janene Harvey. [facility]ag[facility] [phone]  Janene Harvey. [facility]ag[facility] [phone]

## 2023-09-18 NOTE — Progress Notes (Deleted)
 Location:  Other Nursing Home Room Number: [facility] [address]A Place of Service:  SNF ([phone])  Victoria Medina, [place], MD  Patient Care Team: Earnestine Mealing, MD as PCP - General (Family Medicine) Iran Ouch, MD as PCP - Cardiology (Cardiology)  Extended Emergency Contact Information Primary Emergency Contact: Samons Ripley [place] of [place] Home Phone: [phone] Mobile Phone: [phone] Relation: Sister Secondary Emergency Contact: Barron Alvine Address: [address]          [place], [place] [address] D[place] of [place] Home Phone: [phone] Relation: Mother  Goals of care: Advanced Directive information    07/19/2023    9:12 AM  Advanced Directives  Does Patient Have a Medical Advance Directive? Yes  Type of Advance Directive Out of facility DNR (pink MOST or yellow form)  Does patient want to make changes to medical advance directive? No - Patient declined     Chief Complaint  Patient presents with   Chest Pain    Chest Pain.     HPI:  Pt is a 63 y.o. female seen today for an acute visit for ***   Past Medical History:  Diagnosis Date   Acute ST elevation myocardial infarction (STEMI) of inferolateral wall (HCC) 01/25/2020   Blind    Cholecystitis 04/22/2021   History of migraine headaches    Hyperlipemia    Myocardial infarction [facility])    Stroke [facility])    Past Surgical History:  Procedure Laterality Date   ABDOMINAL HYSTERECTOMY     BACK SURGERY     CARDIAC CATHETERIZATION     CORONARY/GRAFT ACUTE MI REVASCULARIZATION N/A 01/25/2020   Procedure: Coronary/Graft Acute MI Revascularization;  Surgeon: Iran Ouch, MD;  Location: [facility] INVASIVE CV LAB;  Service: Cardiovascular;  Laterality: N/A;   FRACTURE SURGERY     IR CATHETER TUBE CHANGE  08/10/2021   LEFT HEART CATH AND CORONARY ANGIOGRAPHY N/A 01/25/2020   Procedure: LEFT HEART CATH AND CORONARY ANGIOGRAPHY;  Surgeon: Iran Ouch, MD;  Location: [facility] INVASIVE  CV LAB;  Service: Cardiovascular;  Laterality: N/A;   SKIN GRAFT Right    TOTAL ABDOMINAL HYSTERECTOMY Bilateral     Allergies  Allergen Reactions   Levaquin [Levofloxacin In D5w] Anaphylaxis and Swelling   Iodinated Contrast Media Itching    Severe itching   Other     Dust mites and opiates (all of them)   Latex Dermatitis    Outpatient Encounter Medications as of 09/18/2023  Medication Sig   aspirin EC 81 MG tablet Take 81 mg by mouth daily.   Azelastine HCl 0.15 % SOLN Place 2 sprays into both nostrils 2 (two) times daily.   Cholecalciferol (VITAMIN D3) 1.25 MG (50000 UT) CAPS Take 1 capsule by mouth once a week.  every Mon for Supplement   diazepam (VALIUM) 10 MG tablet TAKE 1 TABLET BY MOUTH FOUR TIMES A DAY   ibuprofen (ADVIL) 800 MG tablet Take 800 mg by mouth every 8 (eight) hours as needed for mild pain or moderate pain.   lansoprazole (PREVACID) 15 MG capsule Take 15 mg by mouth daily.   levothyroxine (SYNTHROID) 25 MCG tablet Take 25 mcg by mouth daily before breakfast.   losartan (COZAAR) 25 MG tablet Take 1 tablet (25 mg total) by mouth daily.   magnesium hydroxide (MILK OF MAGNESIA) 400 MG/5ML suspension Take 5 mLs by mouth every 6 (six) hours as needed for mild constipation.   nitroGLYCERIN (NITROSTAT) 0.4 MG SL tablet Place 0.4 mg under the tongue  every 5 (five) minutes x 3 doses as needed for chest pain.    OLANZapine (ZYPREXA) 5 MG tablet Take 2.5 mg by mouth 2 (two) times daily.   ondansetron (ZOFRAN-ODT) 4 MG disintegrating tablet Take 4 mg by mouth every 8 (eight) hours as needed for nausea or vomiting.   polyethylene glycol (MIRALAX / GLYCOLAX) 17 g packet Take 17 g by mouth daily.   senna-docusate (SENOKOT-S) 8.6-50 MG tablet Take 2 tablets by mouth at bedtime.   sertraline (ZOLOFT) 100 MG tablet Take 100 mg by mouth daily.   Vibegron (GEMTESA) 75 MG TABS Take 75 mg by mouth daily.   clonazePAM (KLONOPIN) 0.5 MG tablet Take 0.5 tablets (0.25 mg total) by mouth  at bedtime for 28 days. After 2 weeks, make PRN for 2 weeks then discontinue   No facility-administered encounter medications on file as of 09/18/2023.    Review of Systems***  Immunization History  Administered Date(s) Administered   Influenza-Unspecified 04/28/2011, 06/14/2012   Moderna Sars-Covid-2 Vaccination 09/08/2019, 10/06/2019, 06/11/2020   Tdap 09/03/2013, 11/15/2016   Zoster Recombinant(Shingrix) 12/08/2022, 03/10/2023   Pertinent  Health Maintenance Due  Topic Date Due   OPHTHALMOLOGY EXAM  Never done   Colonoscopy  Never done   HEMOGLOBIN A1C  12/02/2023   FOOT EXAM  04/16/2024   MAMMOGRAM  11/29/2024   INFLUENZA VACCINE  Discontinued      11/26/2021    2:10 AM 02/09/2022    1:51 AM 03/01/2022    7:43 AM 05/28/2022    2:13 AM 12/07/2022    2:19 PM  Fall Risk  Falls in the past year?     Exclusion - non ambulatory  (RETIRED) Patient Fall Risk Level High fall risk Moderate fall risk Moderate fall risk Moderate fall risk    Functional Status Survey:    Vitals:   09/18/23 1427 09/18/23 1434  BP: (!) 167/86 (!) 166/90  Pulse: 69   Resp: 18   Temp: 98.1 F (36.7 C)   SpO2: 98%   Weight: 186 lb (84.4 kg)   Height: 5\' 1"  (1.549 m)    Body mass index is 35.14 kg/m. Physical Exam***  Labs reviewed: Recent Labs    01/24/23 0307 03/01/23 0000 06/04/23 0000 09/03/23 0000  NA 132* 135* 133* 135*  K 3.4* 4.3 4.3 4.7  CL 98 99 98* 100  CO2 24 28* 28* 28*  GLUCOSE 178*  --   --   --   BUN 8 7 4 5   CREATININE 0.72 0.8 0.8 0.8  CALCIUM 8.2* 9.3 8.9 8.8   Recent Labs    09/21/22 0000 06/04/23 0000  AST 13 10*  ALT 10 7  ALKPHOS 81 91  ALBUMIN 4.1 3.8   Recent Labs    01/24/23 0307 03/01/23 0000 06/04/23 0000 09/03/23 0000  WBC 9.0 8.4 10.8 10.6  NEUTROABS  --  6,208.00 8,143.00 7,929.00  HGB 9.3* 30.0* 10.1* 9.3*  HCT 34.4*  --  33* 31*  MCV 76.8*  --   --   --   PLT 283 358  --  393   Lab Results  Component Value Date   TSH 2.44  06/04/2023   Lab Results  Component Value Date   HGBA1C 6.5 06/04/2023   Lab Results  Component Value Date   CHOL 218 (A) 11/29/2022   HDL 64 11/29/2022   LDLCALC 135 11/29/2022   TRIG 89 11/29/2022   CHOLHDL 5.3 01/26/2020    Significant Diagnostic Results in last 30 days:  No results found.  Assessment/Plan No problem-specific Assessment & Plan notes found for this encounter.    Victoria Medina. [facility]ag[facility] & Adult Medicine [phone]

## 2023-09-20 ENCOUNTER — Ambulatory Visit: Payer: Medicare Other | Admitting: Physician Assistant

## 2023-09-20 DIAGNOSIS — R339 Retention of urine, unspecified: Secondary | ICD-10-CM

## 2023-09-20 DIAGNOSIS — Z435 Encounter for attention to cystostomy: Secondary | ICD-10-CM

## 2023-09-20 NOTE — Progress Notes (Signed)
 Cath Change/ Replacement  Patient is present today for a SPT catheter change due to urinary retention.  8 ml of water was removed from the balloon, a 18 FR foley cath was removed without difficulty.  Patient was cleaned and prepped in a sterile fashion with betadine and 2% lidocaine jelly was instilled into the urethra. A 18 FR foley cath was replaced into the bladder, no complications were noted. Urine return was noted 5 ml and urine was yellow in color. The balloon was filled with 10ml of sterile water. A night bag was attached for drainage.  A night bag was also given to the patient and patient was given instruction on how to change from one bag to another. Patient was given proper instruction on catheter care.    Performed by: Kidus Delman H RMA  Follow up: 1 month

## 2023-09-24 ENCOUNTER — Non-Acute Institutional Stay (SKILLED_NURSING_FACILITY): Payer: Self-pay | Admitting: Adult Health

## 2023-09-24 ENCOUNTER — Encounter: Payer: Self-pay | Admitting: Adult Health

## 2023-09-24 DIAGNOSIS — E669 Obesity, unspecified: Secondary | ICD-10-CM | POA: Diagnosis not present

## 2023-09-24 DIAGNOSIS — B372 Candidiasis of skin and nail: Secondary | ICD-10-CM

## 2023-09-24 DIAGNOSIS — E1169 Type 2 diabetes mellitus with other specified complication: Secondary | ICD-10-CM

## 2023-09-24 DIAGNOSIS — I10 Essential (primary) hypertension: Secondary | ICD-10-CM | POA: Diagnosis not present

## 2023-09-24 MED ORDER — LOSARTAN POTASSIUM 50 MG PO TABS
50.0000 mg | ORAL_TABLET | Freq: Every day | ORAL | Status: DC
Start: 2023-09-24 — End: 2023-11-03

## 2023-09-24 NOTE — Progress Notes (Signed)
 Location:  Other ([facility] Room Number: 405 A Place of Service:  SNF ([phone]) Provider:  Kenard Gower, DNP, FNP-BC  Patient Care Team: Earnestine Mealing, MD as PCP - General (Family Medicine) Iran Ouch, MD as PCP - Cardiology (Cardiology)  Extended Emergency Contact Information Primary Emergency Contact: Penelope Galas of [place] Home Phone: [phone] Mobile Phone: [phone] Relation: Sister Secondary Emergency Contact: Barron Alvine Address: [address]          [place], [place] [address] Darden Amber of [place] Home Phone: [phone] Relation: Mother  Code Status:   DNR  Goals of care: Advanced Directive information    07/19/2023    9:12 AM  Advanced Directives  Does Patient Have a Medical Advance Directive? Yes  Type of Advance Directive Out of facility DNR (pink MOST or yellow form)  Does patient want to make changes to medical advance directive? No - Patient declined     Chief Complaint  Patient presents with   Acute Visit    Elevated BPs    HPI:  Pt is a 63 y.o. female seen today for an acute visit for elevated BP. She is a resident of [facility]. BP 180/86, 167/86. She takes Losartan 25 mg daily for hypertension.  She complained of left groin discomfort. Noted to have redness and moist and moist left groin. She is bilaterally blind.  Latest A1C 6.5, not on medication for diabetes.   Past Medical History:  Diagnosis Date   Acute ST elevation myocardial infarction (STEMI) of inferolateral wall (HCC) 01/25/2020   Blind    Cholecystitis 04/22/2021   History of migraine headaches    Hyperlipemia    Myocardial infarction [facility])    Stroke [facility])    Past Surgical History:  Procedure Laterality Date   ABDOMINAL HYSTERECTOMY     BACK SURGERY     CARDIAC CATHETERIZATION     CORONARY/GRAFT ACUTE MI REVASCULARIZATION N/A 01/25/2020   Procedure: Coronary/Graft Acute MI  Revascularization;  Surgeon: Iran Ouch, MD;  Location: [facility] INVASIVE CV LAB;  Service: Cardiovascular;  Laterality: N/A;   FRACTURE SURGERY     IR CATHETER TUBE CHANGE  08/10/2021   LEFT HEART CATH AND CORONARY ANGIOGRAPHY N/A 01/25/2020   Procedure: LEFT HEART CATH AND CORONARY ANGIOGRAPHY;  Surgeon: Iran Ouch, MD;  Location: [facility] INVASIVE CV LAB;  Service: Cardiovascular;  Laterality: N/A;   SKIN GRAFT Right    TOTAL ABDOMINAL HYSTERECTOMY Bilateral     Allergies  Allergen Reactions   Levaquin [Levofloxacin In D5w] Anaphylaxis and Swelling   Iodinated Contrast Media Itching    Severe itching   Other     Dust mites and opiates (all of them)   Latex Dermatitis    Outpatient Encounter Medications as of 09/24/2023  Medication Sig   aspirin EC 81 MG tablet Take 81 mg by mouth daily.   Azelastine HCl 0.15 % SOLN Place 2 sprays into both nostrils 2 (two) times daily.   Cholecalciferol (VITAMIN D3) 1.25 MG (50000 UT) CAPS Take 1 capsule by mouth once a week.  every Mon for Supplement   clonazePAM (KLONOPIN) 0.5 MG tablet Take 0.5 tablets (0.25 mg total) by mouth at bedtime for 28 days. After 2 weeks, make PRN for 2 weeks then discontinue   diazepam (VALIUM) 10 MG tablet TAKE 1 TABLET BY MOUTH FOUR TIMES A DAY   ibuprofen (ADVIL) 800 MG tablet Take 800 mg by mouth every 8 (  eight) hours as needed for mild pain or moderate pain.   lansoprazole (PREVACID) 15 MG capsule Take 15 mg by mouth daily.   levothyroxine (SYNTHROID) 25 MCG tablet Take 25 mcg by mouth daily before breakfast.   losartan (COZAAR) 25 MG tablet Take 1 tablet (25 mg total) by mouth daily.   magnesium hydroxide (MILK OF MAGNESIA) 400 MG/5ML suspension Take 5 mLs by mouth every 6 (six) hours as needed for mild constipation.   nitroGLYCERIN (NITROSTAT) 0.4 MG SL tablet Place 0.4 mg under the tongue every 5 (five) minutes x 3 doses as needed for chest pain.    OLANZapine (ZYPREXA) 5 MG tablet Take 2.5 mg by mouth 2  (two) times daily.   ondansetron (ZOFRAN-ODT) 4 MG disintegrating tablet Take 4 mg by mouth every 8 (eight) hours as needed for nausea or vomiting.   polyethylene glycol (MIRALAX / GLYCOLAX) 17 g packet Take 17 g by mouth daily.   senna-docusate (SENOKOT-S) 8.6-50 MG tablet Take 2 tablets by mouth at bedtime.   sertraline (ZOLOFT) 100 MG tablet Take 100 mg by mouth daily.   Vibegron (GEMTESA) 75 MG TABS Take 75 mg by mouth daily.   No facility-administered encounter medications on file as of 09/24/2023.    Review of Systems  Constitutional:  Negative for appetite change, chills, fatigue and fever.  HENT:  Negative for congestion, hearing loss, rhinorrhea and sore throat.   Respiratory:  Negative for cough, shortness of breath and wheezing.   Cardiovascular:  Negative for chest pain, palpitations and leg swelling.  Gastrointestinal:  Negative for abdominal pain, constipation, diarrhea, nausea and vomiting.  Genitourinary:  Negative for dysuria.  Musculoskeletal:  Negative for arthralgias, back pain and myalgias.  Skin:  Negative for color change, rash and wound.  Neurological:  Negative for dizziness, weakness and headaches.  Psychiatric/Behavioral:  Negative for behavioral problems. The patient is not nervous/anxious.      Immunization History  Administered Date(s) Administered   Influenza-Unspecified 04/28/2011, 06/14/2012   Moderna Sars-Covid-2 Vaccination 09/08/2019, 10/06/2019, 06/11/2020   Tdap 09/03/2013, 11/15/2016   Zoster Recombinant(Shingrix) 12/08/2022, 03/10/2023   Pertinent  Health Maintenance Due  Topic Date Due   OPHTHALMOLOGY EXAM  Never done   Colonoscopy  Never done   HEMOGLOBIN A1C  12/02/2023   FOOT EXAM  04/16/2024   MAMMOGRAM  11/29/2024   INFLUENZA VACCINE  Discontinued      11/26/2021    2:10 AM 02/09/2022    1:51 AM 03/01/2022    7:43 AM 05/28/2022    2:13 AM 12/07/2022    2:19 PM  Fall Risk  Falls in the past year?     Exclusion - non ambulatory   (RETIRED) Patient Fall Risk Level High fall risk Moderate fall risk Moderate fall risk Moderate fall risk      Vitals:   09/24/23 1751  BP: (!) 167/86  Pulse: 69  Resp: 18  Temp: 98.1 F (36.7 C)  SpO2: 98%  Weight: 186 lb (84.4 kg)  Height: 5\' 1"  (1.549 m)   Body mass index is 35.14 kg/m.  Physical Exam Constitutional:      Appearance: She is obese.  HENT:     Head: Normocephalic and atraumatic.     Nose: Nose normal.     Mouth/Throat:     Mouth: Mucous membranes are moist.  Eyes:     Conjunctiva/sclera: Conjunctivae normal.     Comments: Blind on both eyes  Cardiovascular:     Rate and Rhythm: Normal rate and  regular rhythm.  Pulmonary:     Effort: Pulmonary effort is normal.     Breath sounds: Normal breath sounds.  Abdominal:     General: Bowel sounds are normal.     Palpations: Abdomen is soft.  Genitourinary:    Comments: Has suprapubic catheter Musculoskeletal:     Cervical back: Normal range of motion.     Comments: Left-sided weakness  Skin:    General: Skin is warm and dry.     Findings: Erythema present.     Comments: Left groin erythematous and moist  Neurological:     General: No focal deficit present.     Mental Status: She is alert and oriented to person, place, and time.  Psychiatric:        Mood and Affect: Mood normal.        Behavior: Behavior normal.        Labs reviewed: Recent Labs    01/24/23 0307 03/01/23 0000 06/04/23 0000 09/03/23 0000  NA 132* 135* 133* 135*  K 3.4* 4.3 4.3 4.7  CL 98 99 98* 100  CO2 24 28* 28* 28*  GLUCOSE 178*  --   --   --   BUN 8 7 4 5   CREATININE 0.72 0.8 0.8 0.8  CALCIUM 8.2* 9.3 8.9 8.8   Recent Labs    06/04/23 0000  AST 10*  ALT 7  ALKPHOS 91  ALBUMIN 3.8   Recent Labs    01/24/23 0307 03/01/23 0000 06/04/23 0000 09/03/23 0000  WBC 9.0 8.4 10.8 10.6  NEUTROABS  --  6,208.00 8,143.00 7,929.00  HGB 9.3* 30.0* 10.1* 9.3*  HCT 34.4*  --  33* 31*  MCV 76.8*  --   --   --   PLT  283 358  --  393   Lab Results  Component Value Date   TSH 2.44 06/04/2023   Lab Results  Component Value Date   HGBA1C 6.5 06/04/2023   Lab Results  Component Value Date   CHOL 218 (A) 11/29/2022   HDL 64 11/29/2022   LDLCALC 135 11/29/2022   TRIG 89 11/29/2022   CHOLHDL 5.3 01/26/2020    Significant Diagnostic Results in last 30 days:  No results found.  Assessment/Plan  1. Uncontrolled hypertension (Primary) -  BP elevated -  will increase Losartan from 25 mg daily to Losartan 50 mg daily -  check BP daily X 2 weeks - losartan (COZAAR) 50 MG tablet; Take 1 tablet (50 mg total) by mouth daily.  2. Candidal skin infection -  will start Nystatin ointment apply topically to left groin BID X 2 weeks -  keep skin clean and dry  3. Type 2 diabetes mellitus with obesity Lab Results  Component Value Date   HGBA1C 6.5 06/04/2023    -  diet-controlled   Family/ staff Communication: Discussed plan of care with resident and charge nurse.  Labs/tests ordered: None    Kenard Gower, DNP, MSN, FNP-BC [facility] [phone] (Monday-Friday 8:00 a.m. - 5:00 p.m.) [phone] (after hours)

## 2023-09-30 NOTE — Progress Notes (Incomplete)
 Location:  Other ([facility]) [facility] Room Number: 405 A Place of Service:  SNF ([phone]) Provider:  Kenard Gower, DNP, FNP-BC  Patient Care Team: Earnestine Mealing, MD as PCP - General (Family Medicine) Iran Ouch, MD as PCP - Cardiology (Cardiology)  Extended Emergency Contact Information Primary Emergency Contact: Penelope Galas of [place] Home Phone: [phone] Mobile Phone: [phone] Relation: Sister Secondary Emergency Contact: Barron Alvine Address: [address]          [place], [place] [address] Darden Amber of [place] Home Phone: [phone] Relation: Mother  Code Status:   DNR  Goals of care: Advanced Directive information    07/19/2023    9:12 AM  Advanced Directives  Does Patient Have a Medical Advance Directive? Yes  Type of Advance Directive Out of facility DNR (pink MOST or yellow form)  Does patient want to make changes to medical advance directive? No - Patient declined     Chief Complaint  Patient presents with  . Acute Visit    Elevated BPs    HPI:  Pt is a 63 y.o. female seen today for an acute visit for elevated BP. She is a resident of [facility]. BP 180/86, 167/86. She takes Losartan 25 mg daily for hypertension.  She complained of left groin discomfort. Noted to have redness and moist and moist left groin. She is bilaterally blind.  Latest A1C 6.5, not on medication for diabetes.   Past Medical History:  Diagnosis Date  . Acute ST elevation myocardial infarction (STEMI) of inferolateral wall (HCC) 01/25/2020  . Blind   . Cholecystitis 04/22/2021  . History of migraine headaches   . Hyperlipemia   . Myocardial infarction (HCC)   . Stroke [facility])    Past Surgical History:  Procedure Laterality Date  . ABDOMINAL HYSTERECTOMY    . BACK SURGERY    . CARDIAC CATHETERIZATION    . CORONARY/GRAFT ACUTE MI REVASCULARIZATION N/A 01/25/2020   Procedure: Coronary/Graft  Acute MI Revascularization;  Surgeon: Iran Ouch, MD;  Location: [facility] INVASIVE CV LAB;  Service: Cardiovascular;  Laterality: N/A;  . FRACTURE SURGERY    . IR CATHETER TUBE CHANGE  08/10/2021  . LEFT HEART CATH AND CORONARY ANGIOGRAPHY N/A 01/25/2020   Procedure: LEFT HEART CATH AND CORONARY ANGIOGRAPHY;  Surgeon: Iran Ouch, MD;  Location: [facility] INVASIVE CV LAB;  Service: Cardiovascular;  Laterality: N/A;  . SKIN GRAFT Right   . TOTAL ABDOMINAL HYSTERECTOMY Bilateral     Allergies  Allergen Reactions  . Levaquin [Levofloxacin In D5w] Anaphylaxis and Swelling  . Iodinated Contrast Media Itching    Severe itching  . Other     Dust mites and opiates (all of them)  . Latex Dermatitis    Outpatient Encounter Medications as of 09/24/2023  Medication Sig  . aspirin EC 81 MG tablet Take 81 mg by mouth daily.  . Azelastine HCl 0.15 % SOLN Place 2 sprays into both nostrils 2 (two) times daily.  . Cholecalciferol (VITAMIN D3) 1.25 MG (50000 UT) CAPS Take 1 capsule by mouth once a week.  every Mon for Supplement  . clonazePAM (KLONOPIN) 0.5 MG tablet Take 0.5 tablets (0.25 mg total) by mouth at bedtime for 28 days. After 2 weeks, make PRN for 2 weeks then discontinue  . diazepam (VALIUM) 10 MG tablet TAKE 1 TABLET BY MOUTH FOUR TIMES A DAY  . ibuprofen (ADVIL) 800 MG tablet Take 800 mg by mouth every 8 (  eight) hours as needed for mild pain or moderate pain.  Marland Kitchen lansoprazole (PREVACID) 15 MG capsule Take 15 mg by mouth daily.  Marland Kitchen levothyroxine (SYNTHROID) 25 MCG tablet Take 25 mcg by mouth daily before breakfast.  . losartan (COZAAR) 25 MG tablet Take 1 tablet (25 mg total) by mouth daily.  . magnesium hydroxide (MILK OF MAGNESIA) 400 MG/5ML suspension Take 5 mLs by mouth every 6 (six) hours as needed for mild constipation.  . nitroGLYCERIN (NITROSTAT) 0.4 MG SL tablet Place 0.4 mg under the tongue every 5 (five) minutes x 3 doses as needed for chest pain.   Marland Kitchen OLANZapine (ZYPREXA) 5 MG  tablet Take 2.5 mg by mouth 2 (two) times daily.  . ondansetron (ZOFRAN-ODT) 4 MG disintegrating tablet Take 4 mg by mouth every 8 (eight) hours as needed for nausea or vomiting.  . polyethylene glycol (MIRALAX / GLYCOLAX) 17 g packet Take 17 g by mouth daily.  Marland Kitchen senna-docusate (SENOKOT-S) 8.6-50 MG tablet Take 2 tablets by mouth at bedtime.  . sertraline (ZOLOFT) 100 MG tablet Take 100 mg by mouth daily.  . Vibegron (GEMTESA) 75 MG TABS Take 75 mg by mouth daily.   No facility-administered encounter medications on file as of 09/24/2023.    Review of Systems  Constitutional:  Negative for appetite change, chills, fatigue and fever.  HENT:  Negative for congestion, hearing loss, rhinorrhea and sore throat.   Respiratory:  Negative for cough, shortness of breath and wheezing.   Cardiovascular:  Negative for chest pain, palpitations and leg swelling.  Gastrointestinal:  Negative for abdominal pain, constipation, diarrhea, nausea and vomiting.  Genitourinary:  Negative for dysuria.  Musculoskeletal:  Negative for arthralgias, back pain and myalgias.  Skin:  Negative for color change, rash and wound.  Neurological:  Negative for dizziness, weakness and headaches.  Psychiatric/Behavioral:  Negative for behavioral problems. The patient is not nervous/anxious.      Immunization History  Administered Date(s) Administered  . Influenza-Unspecified 04/28/2011, 06/14/2012  . Moderna Sars-Covid-2 Vaccination 09/08/2019, 10/06/2019, 06/11/2020  . Tdap 09/03/2013, 11/15/2016  . Zoster Recombinant(Shingrix) 12/08/2022, 03/10/2023   Pertinent  Health Maintenance Due  Topic Date Due  . OPHTHALMOLOGY EXAM  Never done  . Colonoscopy  Never done  . HEMOGLOBIN A1C  12/02/2023  . FOOT EXAM  04/16/2024  . MAMMOGRAM  11/29/2024  . INFLUENZA VACCINE  Discontinued      11/26/2021    2:10 AM 02/09/2022    1:51 AM 03/01/2022    7:43 AM 05/28/2022    2:13 AM 12/07/2022    2:19 PM  Fall Risk  Falls in the  past year?     Exclusion - non ambulatory  (RETIRED) Patient Fall Risk Level High fall risk Moderate fall risk Moderate fall risk Moderate fall risk      Vitals:   09/24/23 1751  BP: (!) 167/86  Pulse: 69  Resp: 18  Temp: 98.1 F (36.7 C)  SpO2: 98%  Weight: 186 lb (84.4 kg)  Height: 5\' 1"  (1.549 m)   Body mass index is 35.14 kg/m.  Physical Exam Constitutional:      Appearance: She is obese.  HENT:     Head: Normocephalic and atraumatic.     Nose: Nose normal.     Mouth/Throat:     Mouth: Mucous membranes are moist.  Eyes:     Conjunctiva/sclera: Conjunctivae normal.     Comments: Blind on both eyes  Cardiovascular:     Rate and Rhythm: Normal rate and  regular rhythm.  Pulmonary:     Effort: Pulmonary effort is normal.     Breath sounds: Normal breath sounds.  Abdominal:     General: Bowel sounds are normal.     Palpations: Abdomen is soft.  Genitourinary:    Comments: Has suprapubic catheter Musculoskeletal:     Cervical back: Normal range of motion.     Comments: Left-sided weakness  Skin:    General: Skin is warm and dry.     Findings: Erythema present.     Comments: Left groin erythematous and moist  Neurological:     General: No focal deficit present.     Mental Status: She is alert and oriented to person, place, and time.  Psychiatric:        Mood and Affect: Mood normal.        Behavior: Behavior normal.        Labs reviewed: Recent Labs    01/24/23 0307 03/01/23 0000 06/04/23 0000 09/03/23 0000  NA 132* 135* 133* 135*  K 3.4* 4.3 4.3 4.7  CL 98 99 98* 100  CO2 24 28* 28* 28*  GLUCOSE 178*  --   --   --   BUN 8 7 4 5   CREATININE 0.72 0.8 0.8 0.8  CALCIUM 8.2* 9.3 8.9 8.8   Recent Labs    06/04/23 0000  AST 10*  ALT 7  ALKPHOS 91  ALBUMIN 3.8   Recent Labs    01/24/23 0307 03/01/23 0000 06/04/23 0000 09/03/23 0000  WBC 9.0 8.4 10.8 10.6  NEUTROABS  --  6,208.00 8,143.00 7,929.00  HGB 9.3* 30.0* 10.1* 9.3*  HCT 34.4*  --   33* 31*  MCV 76.8*  --   --   --   PLT 283 358  --  393   Lab Results  Component Value Date   TSH 2.44 06/04/2023   Lab Results  Component Value Date   HGBA1C 6.5 06/04/2023   Lab Results  Component Value Date   CHOL 218 (A) 11/29/2022   HDL 64 11/29/2022   LDLCALC 135 11/29/2022   TRIG 89 11/29/2022   CHOLHDL 5.3 01/26/2020    Significant Diagnostic Results in last 30 days:  No results found.  Assessment/Plan  1. Uncontrolled hypertension (Primary) -  BP elevated -  will increase Losartan from 25 mg daily to Losartan 50 mg daily -  check BP daily X 2 weeks - losartan (COZAAR) 50 MG tablet; Take 1 tablet (50 mg total) by mouth daily.  2. Candidal skin infection -  will start Nystatin ointment apply topically to left groin BID X 2 weeks -  keep skin clean and dry     Family/ staff Communication: Discussed plan of care with resident and charge nurse.  Labs/tests ordered: None    Kenard Gower, DNP, MSN, FNP-BC [facility] [phone] (Monday-Friday 8:00 a.m. - 5:00 p.m.) [phone] (after hours)

## 2023-10-04 ENCOUNTER — Emergency Department
Admission: EM | Admit: 2023-10-04 | Discharge: 2023-10-04 | Disposition: A | Discharge status: Home / Self Care | Attending: Emergency Medicine | Admitting: Emergency Medicine

## 2023-10-04 ENCOUNTER — Other Ambulatory Visit: Payer: Self-pay

## 2023-10-04 ENCOUNTER — Emergency Department

## 2023-10-04 DIAGNOSIS — Z8673 Personal history of transient ischemic attack (TIA), and cerebral infarction without residual deficits: Secondary | ICD-10-CM | POA: Diagnosis not present

## 2023-10-04 DIAGNOSIS — R0789 Other chest pain: Secondary | ICD-10-CM | POA: Diagnosis not present

## 2023-10-04 DIAGNOSIS — I2510 Atherosclerotic heart disease of native coronary artery without angina pectoris: Secondary | ICD-10-CM | POA: Insufficient documentation

## 2023-10-04 DIAGNOSIS — R079 Chest pain, unspecified: Secondary | ICD-10-CM | POA: Diagnosis present

## 2023-10-04 LAB — CBC
HCT: 36.6 % (ref 36.0–46.0)
Hemoglobin: 11.1 g/dL — ABNORMAL LOW (ref 12.0–15.0)
MCH: 21.8 pg — ABNORMAL LOW (ref 26.0–34.0)
MCHC: 30.3 g/dL (ref 30.0–36.0)
MCV: 71.9 fL — ABNORMAL LOW (ref 80.0–100.0)
Platelets: 435 10*3/uL — ABNORMAL HIGH (ref 150–400)
RBC: 5.09 MIL/uL (ref 3.87–5.11)
RDW: 15.6 % — ABNORMAL HIGH (ref 11.5–15.5)
WBC: 10.3 10*3/uL (ref 4.0–10.5)
nRBC: 0 % (ref 0.0–0.2)

## 2023-10-04 LAB — BASIC METABOLIC PANEL
Anion gap: 12 (ref 5–15)
BUN: <5 mg/dL — ABNORMAL LOW (ref 8–23)
CO2: 26 mmol/L (ref 22–32)
Calcium: 9.6 mg/dL (ref 8.9–10.3)
Chloride: 97 mmol/L — ABNORMAL LOW (ref 98–111)
Creatinine, Ser: 0.71 mg/dL (ref 0.44–1.00)
GFR, Estimated: >60 mL/min (ref >60)
Glucose, Bld: 152 mg/dL — ABNORMAL HIGH (ref 70–99)
Potassium: 4.1 mmol/L (ref 3.5–5.1)
Sodium: 135 mmol/L (ref 135–145)

## 2023-10-04 LAB — TROPONIN I (HIGH SENSITIVITY)
Troponin I (High Sensitivity): 20 ng/L — ABNORMAL HIGH (ref <18)
Troponin I (High Sensitivity): 23 ng/L — ABNORMAL HIGH (ref <18)

## 2023-10-04 NOTE — ED Notes (Deleted)
 [facility] called @ 1:22 for transport to [facility]

## 2023-10-04 NOTE — Progress Notes (Signed)
   10/04/23 1100  Spiritual Encounters  Type of Visit Initial  Care provided to: Patient  Referral source Chaplain assessment  Reason for visit Routine spiritual support  OnCall Visit No  Spiritual Framework  Presenting Themes Caregiving needs  Interventions  Spiritual Care Interventions Made Established relationship of care and support;Compassionate presence;Encouragement  Intervention Outcomes  Outcomes Connection to spiritual care  Spiritual Care Plan  Spiritual Care Issues Still Outstanding No further spiritual care needs at this time (see row info)   Patient asked for a warm blanket and cup of ice. Chaplain checked with nurse to be sure ice could be given and upon approval, provided both items to patient.

## 2023-10-04 NOTE — Discharge Instructions (Signed)
 Your cardiac enzymes today are negative.  You should follow-up with a cardiologist.  We have placed a referral.  The cardiology office should contact you within the next several days to schedule an appointment.  In the meantime, return to the ER for new, worsening, or persistent severe chest pain, difficulty breathing, or any other new or worsening symptoms that concern you.

## 2023-10-04 NOTE — ED Notes (Signed)
Called lab for repeat trop draw. 

## 2023-10-04 NOTE — ED Provider Notes (Signed)
 [facility] Provider Note    Event Date/Time   First MD Initiated Contact with Patient 10/04/23 0825     (approximate)   History   Chest Pain   HPI  Victoria Medina is a 63 y.o. female with a history of CVA with left hemiparesis, blindness, CAD, PE who presents with chest pain, acute onset last night around 9 or 930, initially resolving after short time and then coming back.  The patient states that the pain has now mostly resolved again.  It is substernal in location and she describes it as squeezing.  She had some associated shortness of breath but no lightheadedness and no nausea or vomiting.  She reports that she has had 6 MIs and has stents.  She is on aspirin but no Plavix or anticoagulation.  She does not follow with a cardiologist currently.  I reviewed the past medical records.  The patient's most recent outpatient encounter was with internal medicine on 2/24 for elevated blood pressure.  Her losartan was increased at that time.   Physical Exam   Triage Vital Signs: ED Triage Vitals [10/04/23 0822]  Encounter Vitals Group     BP      Systolic BP Percentile      Diastolic BP Percentile      Pulse      Resp      Temp      Temp src      SpO2      Weight 186 lb (84.4 kg)     Height 5\' 1"  (1.549 m)     Head Circumference      Peak Flow      Pain Score 6     Pain Loc      Pain Education      Exclude from Growth Chart     Most recent vital signs: Vitals:   10/04/23 1200 10/04/23 1230  BP: 123/88 128/67  Pulse: 67 74  Resp: 17 18  Temp:    SpO2: 98% 95%     General: Awake, no distress.  CV:  Good peripheral perfusion.  Resp:  Normal effort.  Lungs CTAB. Abd:  No distention.  Other:  No peripheral edema.   ED Results / Procedures / Treatments   Labs (all labs ordered are listed, but only abnormal results are displayed) Labs Reviewed  BASIC METABOLIC PANEL - Abnormal; Notable for the following components:      Result Value    Chloride 97 (*)    Glucose, Bld 152 (*)    BUN <5 (*)    All other components within normal limits  CBC - Abnormal; Notable for the following components:   Hemoglobin 11.1 (*)    MCV 71.9 (*)    MCH 21.8 (*)    RDW 15.6 (*)    Platelets 435 (*)    All other components within normal limits  TROPONIN I (HIGH SENSITIVITY) - Abnormal; Notable for the following components:   Troponin I (High Sensitivity) 20 (*)    All other components within normal limits  TROPONIN I (HIGH SENSITIVITY) - Abnormal; Notable for the following components:   Troponin I (High Sensitivity) 23 (*)    All other components within normal limits     EKG  ED ECG REPORT I, Dionne Bucy, the attending physician, personally viewed and interpreted this ECG.  Date: 10/04/2023 EKG Time: 0830 Rate: 78 Rhythm: normal sinus rhythm QRS Axis: normal Intervals: normal ST/T Wave abnormalities: Nonspecific ST abnormalities Narrative  Interpretation: ST abnormalities slightly more prominent anterolateral when compared to EKG of 6/26 of last year; no evidence of acute ischemia  ED ECG REPORT I, Dionne Bucy, the attending physician, personally viewed and interpreted this ECG.  Date: 10/04/2023 EKG Time: 1230 Rate: 73 Rhythm: normal sinus rhythm QRS Axis: normal Intervals: normal ST/T Wave abnormalities: Nonspecific ST abnormalities Narrative Interpretation: no evidence of acute ischemia; no dynamic change when compared to EKG of 08 30 today.  Overall similar morphology to prior EKGs   RADIOLOGY  Chest x-ray: I independently viewed and interpreted the images; there is no focal consolidation or edema   PROCEDURES:  Critical Care performed: No  Procedures   MEDICATIONS ORDERED IN ED: Medications - No data to display   IMPRESSION / MDM / ASSESSMENT AND PLAN / ED COURSE  I reviewed the triage vital signs and the nursing notes.  63 year old female with PMH as noted above including history of CAD  and several prior MIs presents with somewhat atypical, nonexertional chest pain since last night, which has now resolved.  Physical exam is unremarkable for acute findings.  EKG shows nonspecific ST abnormalities overall similar to prior.  Differential diagnosis includes, but is not limited to, ACS, musculoskeletal pain, GERD.  I have a low suspicion for aortic dissection or other vascular etiology given that the pain has resolved spontaneously.  Similarly, there is no clinical evidence for PE.  Patient's presentation is most consistent with acute presentation with potential threat to life or bodily function.  The patient is on the cardiac monitor to evaluate for evidence of arrhythmia and/or significant heart rate changes.  ----------------------------------------- 12:59 PM on 10/04/2023 -----------------------------------------  Troponins are 20 and 23, which is not consistent with ACS or any acute ischemia.  BMP and CBC are unremarkable.  The patient has remained pain-free since my initial evaluation.  Repeat EKG shows no dynamic changes or any evidence of acute ischemia.  The patient is feeling better and is eager to go back to her facility.  I did consider whether this patient may benefit from inpatient admission given her significant cardiac history.  However, since her symptoms have resolved and her workup is negative, she is stable for discharge at this time.  She does not currently follow with a cardiologist I have placed an order for cardiology referral.  I counseled her on the results of the workup and plan of care.  I gave strict return precautions and she expressed understanding.   FINAL CLINICAL IMPRESSION(S) / ED DIAGNOSES   Final diagnoses:  Atypical chest pain     Rx / DC Orders   ED Discharge Orders          Ordered    Ambulatory referral to Cardiology       Comments: If you have not heard from the Cardiology office within the next 72 hours please call [phone].    10/04/23 1258             Note:  This document was prepared using Dragon voice recognition software and may include unintentional dictation errors.    Dionne Bucy, MD 10/04/23 1300

## 2023-10-04 NOTE — ED Notes (Signed)
 [facility] called @ 1:22 for Transport  to [facility]
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
## 2023-10-05 ENCOUNTER — Non-Acute Institutional Stay (SKILLED_NURSING_FACILITY): Payer: Self-pay | Admitting: Student

## 2023-10-05 ENCOUNTER — Encounter: Payer: Self-pay | Admitting: Student

## 2023-10-05 DIAGNOSIS — K5901 Slow transit constipation: Secondary | ICD-10-CM

## 2023-10-05 DIAGNOSIS — I25118 Atherosclerotic heart disease of native coronary artery with other forms of angina pectoris: Secondary | ICD-10-CM

## 2023-10-05 DIAGNOSIS — Z66 Do not resuscitate: Secondary | ICD-10-CM

## 2023-10-05 DIAGNOSIS — I10 Essential (primary) hypertension: Secondary | ICD-10-CM | POA: Diagnosis not present

## 2023-10-05 NOTE — Progress Notes (Signed)
 Location:  Other [facility].  Nursing Home Room Number: [facility] [address] Place of Service:  SNF [phone]) Provider:  Earnestine Mealing, MD  Patient Care Team: Earnestine Mealing, MD as PCP - General (Family Medicine) Iran Ouch, MD as PCP - Cardiology (Cardiology)  Extended Emergency Contact Information Primary Emergency Contact: Penelope Galas of [place] Home Phone: [phone] Mobile Phone: [phone] Relation: Sister Secondary Emergency Contact: Barron Alvine Address: [id number]          [place], [place] [address] Darden Amber of [place] Home Phone: [phone] Relation: Mother  Code Status:  DNR Goals of care: Advanced Directive information    10/04/2023    8:23 AM  Advanced Directives  Does Patient Have a Medical Advance Directive? Yes  Type of Advance Directive Out of facility DNR (pink MOST or yellow form)  Pre-existing out of facility DNR order (yellow form or pink MOST form) Physician notified to receive inpatient order     Chief Complaint  Patient presents with   Hospitalization Follow-up    ER Follow up    HPI:  Pt is a 63 y.o. female seen today for ER Follow up  History of Present Illness The patient, with coronary artery disease and a history of myocardial infarction, presents with diarrhea.  She has experienced diarrhea that began this morning, with one episode attributed to consuming prune juice, which she uses to manage her chronic constipation. She has been constipated for ten years, associating it with her past heart attack and strokes. No nausea is present, but constipation persists despite the diarrhea.  She has a history of coronary artery disease and multiple heart attacks, with six stent placements. A non-ST elevation myocardial infarction in March 2015 resulted in a decreased ejection fraction of 35%. She has not seen a cardiologist since 2021 and was previously on palliative care for her heart condition.  She  experienced severe chest pain yesterday, prompting an emergency department visit, although she did not take nitroglycerin before going. Nitroglycerin helps with her chest pain, but she was advised to limit its use. No chest pain is present this morning.  She is not currently on any statin therapy for cholesterol management. She recalls being advised to avoid statins due to potential side effects and has a history of being on atorvastatin but does not remember the specific issues encountered.  Past Medical History:  Diagnosis Date   Acute ST elevation myocardial infarction (STEMI) of inferolateral wall (HCC) 01/25/2020   Blind    Cholecystitis 04/22/2021   History of migraine headaches    Hyperlipemia    Myocardial infarction [facility])    Stroke [facility])    Past Surgical History:  Procedure Laterality Date   ABDOMINAL HYSTERECTOMY     BACK SURGERY     CARDIAC CATHETERIZATION     CORONARY/GRAFT ACUTE MI REVASCULARIZATION N/A 01/25/2020   Procedure: Coronary/Graft Acute MI Revascularization;  Surgeon: Iran Ouch, MD;  Location: [facility] INVASIVE CV LAB;  Service: Cardiovascular;  Laterality: N/A;   FRACTURE SURGERY     IR CATHETER TUBE CHANGE  08/10/2021   LEFT HEART CATH AND CORONARY ANGIOGRAPHY N/A 01/25/2020   Procedure: LEFT HEART CATH AND CORONARY ANGIOGRAPHY;  Surgeon: Iran Ouch, MD;  Location: [facility] INVASIVE CV LAB;  Service: Cardiovascular;  Laterality: N/A;   SKIN GRAFT Right    TOTAL ABDOMINAL HYSTERECTOMY Bilateral     Allergies  Allergen Reactions   Levaquin [Levofloxacin In D5w] Anaphylaxis and Swelling   Iodinated Contrast  Media Itching    Severe itching   Other     Dust mites and opiates (all of them)   Latex Dermatitis    Outpatient Encounter Medications as of 10/05/2023  Medication Sig   aspirin EC 81 MG tablet Take 81 mg by mouth daily.   Azelastine HCl 0.15 % SOLN Place 2 sprays into both nostrils 2 (two) times daily.   Cholecalciferol (VITAMIN D3) 1.25 MG  (50000 UT) CAPS Take 1 capsule by mouth once a week.  every Mon for Supplement   diazepam (VALIUM) 10 MG tablet TAKE 1 TABLET BY MOUTH FOUR TIMES A DAY   ibuprofen (ADVIL) 800 MG tablet Take 800 mg by mouth every 8 (eight) hours as needed for mild pain or moderate pain.   lansoprazole (PREVACID) 15 MG capsule Take 15 mg by mouth daily.   levothyroxine (SYNTHROID) 25 MCG tablet Take 25 mcg by mouth daily before breakfast.   losartan (COZAAR) 50 MG tablet Take 1 tablet (50 mg total) by mouth daily.   magnesium hydroxide (MILK OF MAGNESIA) 400 MG/5ML suspension Take 5 mLs by mouth every 6 (six) hours as needed for mild constipation.   nitroGLYCERIN (NITROSTAT) 0.4 MG SL tablet Place 0.4 mg under the tongue every 5 (five) minutes x 3 doses as needed for chest pain.    nystatin ointment (MYCOSTATIN) Apply 1 Application topically. Apply to Left Groin topically every evening and nigh shift.   OLANZapine (ZYPREXA) 5 MG tablet Take 2.5 mg by mouth 2 (two) times daily.   ondansetron (ZOFRAN-ODT) 4 MG disintegrating tablet Take 4 mg by mouth every 8 (eight) hours as needed for nausea or vomiting.   polyethylene glycol (MIRALAX / GLYCOLAX) 17 g packet Take 17 g by mouth daily.   senna-docusate (SENOKOT-S) 8.6-50 MG tablet Take 2 tablets by mouth at bedtime.   sertraline (ZOLOFT) 100 MG tablet Take 100 mg by mouth daily.   Vibegron (GEMTESA) 75 MG TABS Take 75 mg by mouth daily.   clonazePAM (KLONOPIN) 0.5 MG tablet Take 0.5 tablets (0.25 mg total) by mouth at bedtime for 28 days. After 2 weeks, make PRN for 2 weeks then discontinue   No facility-administered encounter medications on file as of 10/05/2023.    Review of Systems  Immunization History  Administered Date(s) Administered   Influenza-Unspecified 04/28/2011, 06/14/2012   Moderna Sars-Covid-2 Vaccination 09/08/2019, 10/06/2019, 06/11/2020   Tdap 09/03/2013, 11/15/2016   Zoster Recombinant(Shingrix) 12/08/2022, 03/10/2023   Pertinent  Health  Maintenance Due  Topic Date Due   OPHTHALMOLOGY EXAM  Never done   Colonoscopy  Never done   HEMOGLOBIN A1C  12/02/2023   FOOT EXAM  04/16/2024   MAMMOGRAM  11/29/2024   INFLUENZA VACCINE  Discontinued      11/26/2021    2:10 AM 02/09/2022    1:51 AM 03/01/2022    7:43 AM 05/28/2022    2:13 AM 12/07/2022    2:19 PM  Fall Risk  Falls in the past year?     Exclusion - non ambulatory  (RETIRED) Patient Fall Risk Level High fall risk Moderate fall risk Moderate fall risk Moderate fall risk    Functional Status Survey:    Vitals:   10/05/23 1029 10/05/23 1036  BP: (!) 162/83 (!) 156/78  Pulse: 67   Resp: 20   Temp: 98.2 F (36.8 C)   SpO2: 95%   Weight: 190 lb 3.2 oz (86.3 kg)   Height: 5\' 1"  (1.549 m)    Body mass index is 35.94  kg/m. Physical Exam Constitutional:      Comments: Lying in bed  Eyes:     Comments: Disconjugate gaze, blindness  Cardiovascular:     Rate and Rhythm: Normal rate.  Pulmonary:     Effort: Pulmonary effort is normal.  Abdominal:     General: Abdomen is flat. Bowel sounds are normal.     Palpations: Abdomen is soft.     Comments: Suprapubic catheter present  Neurological:     General: No focal deficit present.     Mental Status: Mental status is at baseline.     Labs reviewed: Recent Labs    01/24/23 0307 03/01/23 0000 06/04/23 0000 09/03/23 0000 10/04/23 0832  NA 132*   < > 133* 135* 135  K 3.4*   < > 4.3 4.7 4.1  CL 98   < > 98* 100 97*  CO2 24   < > 28* 28* 26  GLUCOSE 178*  --   --   --  152*  BUN 8   < > 4 5 <5*  CREATININE 0.72   < > 0.8 0.8 0.71  CALCIUM 8.2*   < > 8.9 8.8 9.6   < > = values in this interval not displayed.   Recent Labs    06/04/23 0000  AST 10*  ALT 7  ALKPHOS 91  ALBUMIN 3.8   Recent Labs    01/24/23 0307 03/01/23 0000 06/04/23 0000 09/03/23 0000 10/04/23 0832  WBC 9.0 8.4 10.8 10.6 10.3  NEUTROABS  --  6,208.00 8,143.00 7,929.00  --   HGB 9.3* 30.0* 10.1* 9.3* 11.1*  HCT 34.4*  --  33*  31* 36.6  MCV 76.8*  --   --   --  71.9*  PLT 283 358  --  393 435*   Lab Results  Component Value Date   TSH 2.44 06/04/2023   Lab Results  Component Value Date   HGBA1C 6.5 06/04/2023   Lab Results  Component Value Date   CHOL 218 (A) 11/29/2022   HDL 64 11/29/2022   LDLCALC 135 11/29/2022   TRIG 89 11/29/2022   CHOLHDL 5.3 01/26/2020    Significant Diagnostic Results in last 30 days:  DG Chest Portable 1 View Result Date: 10/04/2023 CLINICAL DATA:  Chest pain EXAM: PORTABLE CHEST 1 VIEW COMPARISON:  Chest radiograph dated 01/24/2023 FINDINGS: Normal lung volumes. No focal consolidations. No pleural effusion or pneumothorax. The heart size and mediastinal contours are within normal limits. No acute osseous abnormality. IMPRESSION: No acute disease. Electronically Signed   By: Agustin Cree M.D.   On: 10/04/2023 09:57    Assessment/Plan Assessment and Plan Coronary Artery Disease (CAD) with history of Myocardial Infarction Coronary artery disease with non-ST elevation myocardial infarction in March 2015. Ejection fraction decreased to 35%. Six myocardial infarctions and six stent placements. Previously declined further interventions and statin therapy. Currently experiencing severe chest pain and reconsidering cardiology consultation for potential interventions. Open to another stent placement if necessary. Advised against frequent nitroglycerin use by ER, but it effectively alleviates chest pain. Open to statin therapy despite previous concerns about side effects. - Refer to cardiologist for evaluation and potential intervention - Discuss potential use of statin therapy for myocardial infarction and stroke prevention - Continue nitroglycerin as needed for chest pain, up to three doses at a time  Diarrhea Acute onset of diarrhea this morning, with one episode reported. Chronic constipation attributed to past myocardial infarction and strokes. Recent prune juice intake may have  contributed  to diarrhea. - Hold prune juice to prevent further diarrhea - Evaluate need to hold stool softeners if diarrhea persists  Chronic Constipation Chronic constipation for the past ten years, attributed to past myocardial infarction and strokes. Recent diarrhea episode possibly related to prune juice intake. - Hold prune juice to prevent further diarrhea - Evaluate need to hold stool softeners if diarrhea persists  Goals of Care Goals of care have shifted due to severe chest pain. Previously declined interventions but now open to cardiology consultation and considering further interventions for pain management. Allergic to morphine and oxycodone, which cause itching. Prefers to avoid invasive procedures if pain can be managed with medication. - Refer to cardiologist for evaluation and discussion of pain management options - Discuss alternative pain management strategies, avoiding morphine and oxycodone  Family/ staff Communication: nursing  Labs/tests ordered:  none

## 2023-10-11 ENCOUNTER — Encounter: Payer: Self-pay | Admitting: Nurse Practitioner

## 2023-10-11 ENCOUNTER — Non-Acute Institutional Stay (SKILLED_NURSING_FACILITY): Payer: Self-pay | Admitting: Nurse Practitioner

## 2023-10-11 DIAGNOSIS — K5901 Slow transit constipation: Secondary | ICD-10-CM | POA: Diagnosis not present

## 2023-10-11 DIAGNOSIS — E782 Mixed hyperlipidemia: Secondary | ICD-10-CM | POA: Diagnosis not present

## 2023-10-11 DIAGNOSIS — I10 Essential (primary) hypertension: Secondary | ICD-10-CM

## 2023-10-11 DIAGNOSIS — R079 Chest pain, unspecified: Secondary | ICD-10-CM

## 2023-10-11 DIAGNOSIS — Z9359 Other cystostomy status: Secondary | ICD-10-CM

## 2023-10-11 DIAGNOSIS — F411 Generalized anxiety disorder: Secondary | ICD-10-CM

## 2023-10-11 NOTE — Progress Notes (Addendum)
 Location:  Other [facility].  Nursing Home Room Number: [facility] [address]A Place of Service:  SNF [phone]) Abbey Chatters, NP  PCP: Earnestine Mealing, MD  Patient Care Team: Earnestine Mealing, MD as PCP - General (Family Medicine) Iran Ouch, MD as PCP - Cardiology (Cardiology)  Extended Emergency Contact Information Primary Emergency Contact: Penelope Galas of [place] Home Phone: [phone] Mobile Phone: [phone] Relation: Sister Secondary Emergency Contact: Barron Alvine Address: [address]          [place], [place] [address] Darden Amber of [place] Home Phone: [phone] Relation: Mother  Goals of care: Advanced Directive information    10/04/2023    8:23 AM  Advanced Directives  Does Patient Have a Medical Advance Directive? Yes  Type of Advance Directive Out of facility DNR (pink MOST or yellow form)  Pre-existing out of facility DNR order (yellow form or pink MOST form) Physician notified to receive inpatient order     Chief Complaint  Patient presents with   Medical Management of Chronic Issues    Medical Management of Chronic Issues. Anxiety.     HPI:  Victoria Medina is a 63 y.o. female seen today for medical management of chronic disease and Anxiety.   She reports persistent chest and upper body pain. She has been experiencing persistent chest pain and went to the ED on October 04, 2023. The pain is described as stabbing and primarily located in the back of her arms and the upper part of her body, including the chest muscles. It is not reproducible by touch and is associated with shortness of breath. Nitroglycerin provides relief for the pain. No increase in anxiety, depression, indigestion, or acid reflux is noted.   She is currently taking nitroglycerin for pain relief and losartan 50 mg for blood pressure management.   She is also on Valium for anxiety, which does not alleviate her chest pain. She denies any worsening of anxiety    She is seeing a urologist monthly for suprapubic catheter changes, with her last visit on September 20, 2023, and the next scheduled for October 16, 2023.   Past Medical History:  Diagnosis Date   Acute ST elevation myocardial infarction (STEMI) of inferolateral wall (HCC) 01/25/2020   Blind    Cholecystitis 04/22/2021   History of migraine headaches    Hyperlipemia    Myocardial infarction [facility])    Stroke [facility])    Past Surgical History:  Procedure Laterality Date   ABDOMINAL HYSTERECTOMY     BACK SURGERY     CARDIAC CATHETERIZATION     CORONARY/GRAFT ACUTE MI REVASCULARIZATION N/A 01/25/2020   Procedure: Coronary/Graft Acute MI Revascularization;  Surgeon: Iran Ouch, MD;  Location: [facility] INVASIVE CV LAB;  Service: Cardiovascular;  Laterality: N/A;   FRACTURE SURGERY     IR CATHETER TUBE CHANGE  08/10/2021   LEFT HEART CATH AND CORONARY ANGIOGRAPHY N/A 01/25/2020   Procedure: LEFT HEART CATH AND CORONARY ANGIOGRAPHY;  Surgeon: Iran Ouch, MD;  Location: [facility] INVASIVE CV LAB;  Service: Cardiovascular;  Laterality: N/A;   SKIN GRAFT Right    TOTAL ABDOMINAL HYSTERECTOMY Bilateral     Allergies  Allergen Reactions   Levaquin [Levofloxacin In D5w] Anaphylaxis and Swelling   Iodinated Contrast Media Itching    Severe itching   Other     Dust mites and opiates (all of them)   Latex Dermatitis    Outpatient Encounter Medications as of 10/11/2023  Medication Sig   aspirin EC 81  MG tablet Take 81 mg by mouth daily.   Azelastine HCl 0.15 % SOLN Place 2 sprays into both nostrils 2 (two) times daily.   Cholecalciferol (VITAMIN D3) 1.25 MG (50000 UT) CAPS Take 1 capsule by mouth once a week.  every Mon for Supplement   diazepam (VALIUM) 10 MG tablet TAKE 1 TABLET BY MOUTH FOUR TIMES A DAY   ibuprofen (ADVIL) 800 MG tablet Take 800 mg by mouth every 8 (eight) hours as needed for mild pain or moderate pain.   lansoprazole (PREVACID) 15 MG capsule Take 15 mg by mouth daily.    levothyroxine (SYNTHROID) 25 MCG tablet Take 25 mcg by mouth daily before breakfast.   losartan (COZAAR) 50 MG tablet Take 1 tablet (50 mg total) by mouth daily.   magnesium hydroxide (MILK OF MAGNESIA) 400 MG/5ML suspension Take 5 mLs by mouth every 6 (six) hours as needed for mild constipation.   nitroGLYCERIN (NITROSTAT) 0.4 MG SL tablet Place 0.4 mg under the tongue every 5 (five) minutes x 3 doses as needed for chest pain.    OLANZapine (ZYPREXA) 5 MG tablet Take 2.5 mg by mouth 2 (two) times daily.   ondansetron (ZOFRAN-ODT) 4 MG disintegrating tablet Take 4 mg by mouth every 8 (eight) hours as needed for nausea or vomiting.   polyethylene glycol (MIRALAX / GLYCOLAX) 17 g packet Take 17 g by mouth daily.   senna-docusate (SENOKOT-S) 8.6-50 MG tablet Take 2 tablets by mouth at bedtime.   sertraline (ZOLOFT) 100 MG tablet Take 100 mg by mouth daily.   Vibegron (GEMTESA) 75 MG TABS Take 75 mg by mouth daily.   clonazePAM (KLONOPIN) 0.5 MG tablet Take 0.5 tablets (0.25 mg total) by mouth at bedtime for 28 days. After 2 weeks, make PRN for 2 weeks then discontinue   nystatin ointment (MYCOSTATIN) Apply 1 Application topically. Apply to Left Groin topically every evening and nigh shift. (Patient not taking: Reported on 10/11/2023)   No facility-administered encounter medications on file as of 10/11/2023.    Review of Systems  Constitutional:  Negative for activity change, appetite change, fatigue and unexpected weight change.  HENT:  Negative for congestion and hearing loss.   Eyes: Negative.   Respiratory:  Negative for cough and shortness of breath.   Cardiovascular:  Positive for chest pain. Negative for palpitations and leg swelling.  Gastrointestinal:  Negative for abdominal pain, constipation and diarrhea.  Genitourinary:  Negative for difficulty urinating and dysuria.  Musculoskeletal:  Negative for arthralgias and myalgias.  Skin:  Negative for color change and wound.   Neurological:  Positive for weakness. Negative for dizziness.  Psychiatric/Behavioral:  Negative for agitation, behavioral problems and confusion.      Immunization History  Administered Date(s) Administered   Influenza-Unspecified 04/28/2011, 06/14/2012   Moderna Sars-Covid-2 Vaccination 09/08/2019, 10/06/2019, 06/11/2020   Tdap 09/03/2013, 11/15/2016   Zoster Recombinant(Shingrix) 12/08/2022, 03/10/2023   Pertinent  Health Maintenance Due  Topic Date Due   Colonoscopy  10/10/2024 (Originally 07/09/2006)   OPHTHALMOLOGY EXAM  10/22/2024 (Originally 07/10/1971)   HEMOGLOBIN A1C  12/02/2023   FOOT EXAM  04/16/2024   MAMMOGRAM  11/29/2024   INFLUENZA VACCINE  Discontinued      11/26/2021    2:10 AM 02/09/2022    1:51 AM 03/01/2022    7:43 AM 05/28/2022    2:13 AM 12/07/2022    2:19 PM  Fall Risk  Falls in the past year?     Exclusion - non ambulatory  (RETIRED) Patient Fall Risk  Level High fall risk Moderate fall risk Moderate fall risk Moderate fall risk    Functional Status Survey:    Vitals:   10/11/23 1004 10/11/23 1029  BP: (!) 160/85 (!) 142/81  Pulse: 79   Resp: 20   Temp: 98.5 F (36.9 C)   SpO2: 96%   Weight: 190 lb 3.2 oz (86.3 kg)   Height: 5\' 1"  (1.549 m)    Body mass index is 35.94 kg/m. Physical Exam Constitutional:      General: She is not in acute distress.    Appearance: She is well-developed. She is not diaphoretic.  HENT:     Head: Normocephalic and atraumatic.     Mouth/Throat:     Pharynx: No oropharyngeal exudate.  Eyes:     Conjunctiva/sclera: Conjunctivae normal.     Pupils: Pupils are equal, round, and reactive to light.  Cardiovascular:     Rate and Rhythm: Normal rate and regular rhythm.     Heart sounds: Normal heart sounds.  Pulmonary:     Effort: Pulmonary effort is normal.     Breath sounds: Normal breath sounds.  Abdominal:     General: Bowel sounds are normal.     Palpations: Abdomen is soft.  Musculoskeletal:     Cervical  back: Normal range of motion and neck supple.     Right lower leg: No edema.     Left lower leg: No edema.  Skin:    General: Skin is warm and dry.  Neurological:     Mental Status: She is alert. Mental status is at baseline.     Motor: Weakness present.     Gait: Gait abnormal.     Comments: Left sided hemiparesis.   Psychiatric:        Mood and Affect: Mood normal.     Labs reviewed: Recent Labs    01/24/23 0307 03/01/23 0000 06/04/23 0000 09/03/23 0000 10/04/23 0832  NA 132*   < > 133* 135* 135  K 3.4*   < > 4.3 4.7 4.1  CL 98   < > 98* 100 97*  CO2 24   < > 28* 28* 26  GLUCOSE 178*  --   --   --  152*  BUN 8   < > 4 5 <5*  CREATININE 0.72   < > 0.8 0.8 0.71  CALCIUM 8.2*   < > 8.9 8.8 9.6   < > = values in this interval not displayed.   Recent Labs    06/04/23 0000  AST 10*  ALT 7  ALKPHOS 91  ALBUMIN 3.8   Recent Labs    01/24/23 0307 03/01/23 0000 06/04/23 0000 09/03/23 0000 10/04/23 0832  WBC 9.0 8.4 10.8 10.6 10.3  NEUTROABS  --  6,208.00 8,143.00 7,929.00  --   HGB 9.3* 30.0* 10.1* 9.3* 11.1*  HCT 34.4*  --  33* 31* 36.6  MCV 76.8*  --   --   --  71.9*  PLT 283 358  --  393 435*   Lab Results  Component Value Date   TSH 2.44 06/04/2023   Lab Results  Component Value Date   HGBA1C 6.5 06/04/2023   Lab Results  Component Value Date   CHOL 218 (A) 11/29/2022   HDL 64 11/29/2022   LDLCALC 135 11/29/2022   TRIG 89 11/29/2022   CHOLHDL 5.3 01/26/2020    Significant Diagnostic Results in last 30 days:  DG Chest Portable 1 View Result Date: 10/04/2023 CLINICAL DATA:  Chest pain EXAM: PORTABLE CHEST 1 VIEW COMPARISON:  Chest radiograph dated 01/24/2023 FINDINGS: Normal lung volumes. No focal consolidations. No pleural effusion or pneumothorax. The heart size and mediastinal contours are within normal limits. No acute osseous abnormality. IMPRESSION: No acute disease. Electronically Signed   By: Agustin Cree M.D.   On: 10/04/2023 09:57     Assessment/Plan Chest pain  Intermittent chest pain with dyspnea, relieved by nitroglycerin, suggests possible cardiac origin. EKG and troponin negative for coronary event in ED on 3/6.   Differential includes musculoskeletal pain or atypical angina. Awaiting cardiology evaluation. New appt made for 3/17  - Increase losartan to 100 mg daily. - Consider Imdur if pain persists and cardiac-related. - Continue nitroglycerin as needed.  Hypertension Blood pressure not at goal, slight improvement noted. Losartan to be increased for better control. - Increase losartan to 100 mg daily. - Monitor blood pressure, consider alternative antihypertensives if uncontrolled.  Anxiety She is wanting to change to xanax however will hold off at this time.   Constipation Chronic constipation post-stroke, inadequately managed with Miralax.  - Continue Miralax, monitor bowel movement frequency.  Suprapubic Catheter Management Monthly urological care for catheter changes ongoing. - Continue monthly urology appointments for catheter management.  Hyperlipidemia Declines treatment at this time  Janene Harvey. [facility] [phone]

## 2023-10-16 ENCOUNTER — Ambulatory Visit (INDEPENDENT_AMBULATORY_CARE_PROVIDER_SITE_OTHER): Payer: Medicare Other | Admitting: Physician Assistant

## 2023-10-16 DIAGNOSIS — Z435 Encounter for attention to cystostomy: Secondary | ICD-10-CM | POA: Diagnosis not present

## 2023-10-16 NOTE — Progress Notes (Signed)
 Suprapubic Cath Change  Patient is present today for a suprapubic catheter change due to urinary retention.  8ml of water was drained from the balloon, a 18FR Silastic foley cath was removed from the tract without difficulty.  Site was cleaned and prepped in a sterile fashion with betadine.  A 18FR Silastic foley cath was replaced into the tract no complications were noted. Urine return was noted, 10 ml of sterile water was inflated into the balloon and a night bag was attached for drainage.  Patient tolerated well.   Performed by: Carman Ching, PA-C and Ples Specter, CMA  Follow up: Return in about 4 weeks (around 11/13/2023) for SPT exchange.

## 2023-10-18 ENCOUNTER — Ambulatory Visit: Payer: Medicare Other | Admitting: Physician Assistant

## 2023-11-02 ENCOUNTER — Non-Acute Institutional Stay (SKILLED_NURSING_FACILITY): Payer: Self-pay | Admitting: Student

## 2023-11-02 ENCOUNTER — Encounter: Payer: Self-pay | Admitting: Student

## 2023-11-02 DIAGNOSIS — I25118 Atherosclerotic heart disease of native coronary artery with other forms of angina pectoris: Secondary | ICD-10-CM

## 2023-11-02 DIAGNOSIS — I10 Essential (primary) hypertension: Secondary | ICD-10-CM

## 2023-11-02 NOTE — Progress Notes (Signed)
 Location:  [facility].  Nursing Home Room Number: [facility] [address] Place of Service:  SNF [phone]) Provider:  Earnestine Mealing, MD  Patient Care Team: Earnestine Mealing, MD as PCP - General (Family Medicine) Iran Ouch, MD as PCP - Cardiology (Cardiology)  Extended Emergency Contact Information Primary Emergency Contact: Penelope Galas of [place] Home Phone: [phone] Mobile Phone: [phone] Relation: Sister Secondary Emergency Contact: Barron Alvine Address: [address]          [place], [place] [address] Darden Amber of [place] Home Phone: [phone] Relation: Mother  Code Status:  DNR Goals of care: Advanced Directive information    11/02/2023   10:31 AM  Advanced Directives  Does Patient Have a Medical Advance Directive? Yes  Type of Advance Directive Out of facility DNR (pink MOST or yellow form)  Does patient want to make changes to medical advance directive? No - Patient declined     Chief Complaint  Patient presents with   Chest Pain    Chest Pain.     HPI:  Pt is a 63 y.o. female seen today for an acute visit for Chest Pains. The patient, with coronary artery disease, presents with chest pain.  She experiences chest pain that differs from previous episodes associated with her past myocardial infarctions. The pain is primarily located in her chest and does not radiate to her arms as it did during past events. She has had six myocardial infarctions in the past, with the last one occurring ten years ago. The pain has been somewhat alleviated by nitroglycerin, which she has taken three times since 8:30 AM.  She recently saw a cardiologist who noted significant coronary artery disease with all cardiac arteries being ninety percent occluded, as determined by a catheterization performed ten years ago. She is currently taking a statin, a blood pressure medication, and a medication containing nitrous oxide, which she takes  twice a day, although she does not recall the names of these medications.  Her chest pain often occurs during bowel movements, and she experiences frequent constipation.  Past Medical History:  Diagnosis Date   Acute ST elevation myocardial infarction (STEMI) of inferolateral wall (HCC) 01/25/2020   Blind    Cholecystitis 04/22/2021   History of migraine headaches    Hyperlipemia    Myocardial infarction [facility])    Stroke [facility])    Past Surgical History:  Procedure Laterality Date   ABDOMINAL HYSTERECTOMY     BACK SURGERY     CARDIAC CATHETERIZATION     CORONARY/GRAFT ACUTE MI REVASCULARIZATION N/A 01/25/2020   Procedure: Coronary/Graft Acute MI Revascularization;  Surgeon: Iran Ouch, MD;  Location: [facility] INVASIVE CV LAB;  Service: Cardiovascular;  Laterality: N/A;   FRACTURE SURGERY     IR CATHETER TUBE CHANGE  08/10/2021   LEFT HEART CATH AND CORONARY ANGIOGRAPHY N/A 01/25/2020   Procedure: LEFT HEART CATH AND CORONARY ANGIOGRAPHY;  Surgeon: Iran Ouch, MD;  Location: [facility] INVASIVE CV LAB;  Service: Cardiovascular;  Laterality: N/A;   SKIN GRAFT Right    TOTAL ABDOMINAL HYSTERECTOMY Bilateral     Allergies  Allergen Reactions   Levaquin [Levofloxacin In D5w] Anaphylaxis and Swelling   Iodinated Contrast Media Itching    Severe itching   Other     Dust mites and opiates (all of them)   Latex Dermatitis    Outpatient Encounter Medications as of 11/02/2023  Medication Sig   isosorbide mononitrate (ISMO) 20 MG tablet Take 20 mg by  mouth 2 (two) times daily at 10 AM and 5 PM.   ranolazine (RANEXA) 500 MG 12 hr tablet Take 500 mg by mouth 2 (two) times daily.   rosuvastatin (CRESTOR) 5 MG tablet Take 5 mg by mouth daily. Every Tuesday,Thursday,Sat   aspirin EC 81 MG tablet Take 81 mg by mouth daily.   Azelastine HCl 0.15 % SOLN Place 2 sprays into both nostrils 2 (two) times daily.   Cholecalciferol (VITAMIN D3) 1.25 MG (50000 UT) CAPS Take 1 capsule by mouth once a  week.  every Mon for Supplement   clonazePAM (KLONOPIN) 0.5 MG tablet Take 0.5 tablets (0.25 mg total) by mouth at bedtime for 28 days. After 2 weeks, make PRN for 2 weeks then discontinue   diazepam (VALIUM) 10 MG tablet TAKE 1 TABLET BY MOUTH FOUR TIMES A DAY   ibuprofen (ADVIL) 800 MG tablet Take 800 mg by mouth every 8 (eight) hours as needed for mild pain or moderate pain.   lansoprazole (PREVACID) 15 MG capsule Take 15 mg by mouth daily.   levothyroxine (SYNTHROID) 25 MCG tablet Take 25 mcg by mouth daily before breakfast.   losartan (COZAAR) 50 MG tablet Take 1 tablet (50 mg total) by mouth daily. (Patient taking differently: Take 100 mg by mouth daily.)   magnesium hydroxide (MILK OF MAGNESIA) 400 MG/5ML suspension Take 5 mLs by mouth every 6 (six) hours as needed for mild constipation.   nitroGLYCERIN (NITROSTAT) 0.4 MG SL tablet Place 0.4 mg under the tongue every 5 (five) minutes x 3 doses as needed for chest pain.    nystatin ointment (MYCOSTATIN) Apply 1 Application topically. Apply to Left Groin topically every evening and nigh shift. (Patient not taking: Reported on 11/02/2023)   OLANZapine (ZYPREXA) 5 MG tablet Take 2.5 mg by mouth 2 (two) times daily.   ondansetron (ZOFRAN-ODT) 4 MG disintegrating tablet Take 4 mg by mouth every 8 (eight) hours as needed for nausea or vomiting.   polyethylene glycol (MIRALAX / GLYCOLAX) 17 g packet Take 17 g by mouth daily.   senna-docusate (SENOKOT-S) 8.6-50 MG tablet Take 2 tablets by mouth at bedtime.   sertraline (ZOLOFT) 100 MG tablet Take 100 mg by mouth daily.   Vibegron (GEMTESA) 75 MG TABS Take 75 mg by mouth daily.   No facility-administered encounter medications on file as of 11/02/2023.    Review of Systems  Immunization History  Administered Date(s) Administered   Influenza-Unspecified 04/28/2011, 06/14/2012   Moderna Sars-Covid-2 Vaccination 09/08/2019, 10/06/2019, 06/11/2020   Tdap 09/03/2013, 11/15/2016   Zoster  Recombinant(Shingrix) 12/08/2022, 03/10/2023   Pertinent  Health Maintenance Due  Topic Date Due   Colonoscopy  10/10/2024 (Originally 07/09/2006)   OPHTHALMOLOGY EXAM  10/22/2024 (Originally 07/10/1971)   HEMOGLOBIN A1C  12/02/2023   FOOT EXAM  04/16/2024   MAMMOGRAM  11/29/2024   INFLUENZA VACCINE  Discontinued      11/26/2021    2:10 AM 02/09/2022    1:51 AM 03/01/2022    7:43 AM 05/28/2022    2:13 AM 12/07/2022    2:19 PM  Fall Risk  Falls in the past year?     Exclusion - non ambulatory  (RETIRED) Patient Fall Risk Level High fall risk Moderate fall risk Moderate fall risk Moderate fall risk    Functional Status Survey:    Vitals:   11/02/23 1015  BP: (!) 149/72  Pulse: 75  Resp: 17  Temp: 97.6 F (36.4 C)  SpO2: 96%  Weight: 188 lb 12.8 oz (85.6  kg)  Height: 5\' 1"  (1.549 m)   Body mass index is 35.67 kg/m. Physical Exam Cardiovascular:     Heart sounds: Normal heart sounds.  Pulmonary:     Effort: Pulmonary effort is normal. No tachypnea.     Breath sounds: Normal breath sounds.  Abdominal:     General: Bowel sounds are normal.     Palpations: Abdomen is soft.  Musculoskeletal:     Comments: Left side contractures  Neurological:     Mental Status: She is alert.     Labs reviewed: Recent Labs    01/24/23 0307 03/01/23 0000 06/04/23 0000 09/03/23 0000 10/04/23 0832  NA 132*   < > 133* 135* 135  K 3.4*   < > 4.3 4.7 4.1  CL 98   < > 98* 100 97*  CO2 24   < > 28* 28* 26  GLUCOSE 178*  --   --   --  152*  BUN 8   < > 4 5 <5*  CREATININE 0.72   < > 0.8 0.8 0.71  CALCIUM 8.2*   < > 8.9 8.8 9.6   < > = values in this interval not displayed.   Recent Labs    06/04/23 0000  AST 10*  ALT 7  ALKPHOS 91  ALBUMIN 3.8   Recent Labs    01/24/23 0307 03/01/23 0000 06/04/23 0000 09/03/23 0000 10/04/23 0832  WBC 9.0 8.4 10.8 10.6 10.3  NEUTROABS  --  6,208.00 8,143.00 7,929.00  --   HGB 9.3* 30.0* 10.1* 9.3* 11.1*  HCT 34.4*  --  33* 31* 36.6   MCV 76.8*  --   --   --  71.9*  PLT 283 358  --  393 435*   Lab Results  Component Value Date   TSH 2.44 06/04/2023   Lab Results  Component Value Date   HGBA1C 6.5 06/04/2023   Lab Results  Component Value Date   CHOL 218 (A) 11/29/2022   HDL 64 11/29/2022   LDLCALC 135 11/29/2022   TRIG 89 11/29/2022   CHOLHDL 5.3 01/26/2020    Significant Diagnostic Results in last 30 days:  DG Chest Portable 1 View Result Date: 10/04/2023 CLINICAL DATA:  Chest pain EXAM: PORTABLE CHEST 1 VIEW COMPARISON:  Chest radiograph dated 01/24/2023 FINDINGS: Normal lung volumes. No focal consolidations. No pleural effusion or pneumothorax. The heart size and mediastinal contours are within normal limits. No acute osseous abnormality. IMPRESSION: No acute disease. Electronically Signed   By: Agustin Cree M.D.   On: 10/04/2023 09:57    Results DIAGNOSTIC Cardiac catheterization: Coronary arteries 90% stenosed EKG: Stable, no acute changes  Assessment/Plan Stable Angina She is experiencing chest pain distinct from previous myocardial infarctions, without radiation to the arms. She has significant coronary artery disease with 90% stenosis in all cardiac arteries, confirmed by catheterization 10 years ago. She is reluctant to undergo invasive procedures like coronary artery bypass grafting but is open to additional percutaneous coronary interventions. The chest pain is associated with bowel movements and constipation. Nitroglycerin provides some relief, but frequent dosing suggests inadequate management. The cardiologist's note indicates pessimism about further interventions due to her significant coronary artery disease and her preferences. - Adjust antihypertensive medication to help with chest pain. - Add a long-acting nitrate to manage chest pain. - Order an electrocardiogram to assess for any acute changes STAT - Address constipation to potentially alleviate chest pain. - Consult cardiologist for further  recommendations.  Hypertension Her blood pressure is  elevated, potentially contributing to her chest pain. Current antihypertensive therapy may require adjustment to improve blood pressure control and alleviate symptoms. - Adjust antihypertensive medication to achieve better control.  Constipation She reports persistent constipation, which she believes is related to her chest pain episodes. Addressing constipation may reduce the frequency and severity of chest pain. - Address constipation to potentially alleviate chest pain. - increase miralax to BID  Goals of Care She prefers non-invasive management of her coronary artery disease, declining coronary artery bypass grafting but open to additional stents if necessary. She acknowledges that emergency department visits may not be beneficial given her condition given the last three episodes have not resulted in changes in management. - Discuss potential for additional stents with cardiologist during follow-up. - if pain persists next week with medication changes, reschedule cardiology appointment.   Follow-up She has a follow-up appointment with her cardiologist in two months to discuss current symptoms and potential interventions, such as additional stents. - Follow up with cardiologist in two months to discuss potential interventions.  Family/ staff Communication: nursing  Labs/tests ordered:  ECG

## 2023-11-03 MED ORDER — LOSARTAN POTASSIUM 50 MG PO TABS: 100.0000 mg | ORAL_TABLET | Freq: Every day | ORAL | Status: DC

## 2023-11-05 ENCOUNTER — Non-Acute Institutional Stay (SKILLED_NURSING_FACILITY): Payer: Self-pay | Admitting: Student

## 2023-11-05 ENCOUNTER — Encounter: Payer: Self-pay | Admitting: Student

## 2023-11-05 DIAGNOSIS — R197 Diarrhea, unspecified: Secondary | ICD-10-CM

## 2023-11-05 DIAGNOSIS — R110 Nausea: Secondary | ICD-10-CM | POA: Diagnosis not present

## 2023-11-05 LAB — BASIC METABOLIC PANEL WITH GFR
BUN: 4 (ref 4–21)
CO2: 27 — AB (ref 13–22)
Chloride: 90 — AB (ref 99–108)
Creatinine: 0.7 (ref 0.5–1.1)
Glucose: 134
Potassium: 4.3 meq/L (ref 3.5–5.1)
Sodium: 126 — AB (ref 137–147)

## 2023-11-05 LAB — CBC AND DIFFERENTIAL
HCT: 31 — AB (ref 36–46)
Hemoglobin: 9.4 — AB (ref 12.0–16.0)
Platelets: 405 10*3/uL — AB (ref 150–400)
WBC: 9.3

## 2023-11-05 LAB — COMPREHENSIVE METABOLIC PANEL WITH GFR
Calcium: 8.3 — AB (ref 8.7–10.7)
eGFR: 93

## 2023-11-05 LAB — CBC: RBC: 4.44 (ref 3.87–5.11)

## 2023-11-05 NOTE — Progress Notes (Signed)
 Location:  Other Nursing Home Room Number: [facility] [address]A Place of Service:  SNF [phone]) Provider:  Ander Gaster, Benetta Spar, MD  Patient Care Team: Earnestine Mealing, MD as PCP - General (Family Medicine) Iran Ouch, MD as PCP - Cardiology (Cardiology)  Extended Emergency Contact Information Primary Emergency Contact: Penelope Galas of [place] Home Phone: [phone] Mobile Phone: [phone] Relation: Sister Secondary Emergency Contact: Barron Alvine Address: [address]          [place], [place] [address] Darden Amber of [place] Home Phone: [phone] Relation: Mother  Code Status:  DNR Goals of care: Advanced Directive information    11/02/2023   10:31 AM  Advanced Directives  Does Patient Have a Medical Advance Directive? Yes  Type of Advance Directive Out of facility DNR (pink MOST or yellow form)  Does patient want to make changes to medical advance directive? No - Patient declined     Chief Complaint  Patient presents with   Acute Visit   Nausea    HPI:  Pt is a 63 y.o. female seen today for an acute visit for Diarrhea and nausea.   Chest pain has subsided. She states she feels like she needs to have a bowel movement. She had 3 episodes of diarrhea this weekend. No shortness of breath.   Past Medical History:  Diagnosis Date   Acute ST elevation myocardial infarction (STEMI) of inferolateral wall (HCC) 01/25/2020   Blind    Cholecystitis 04/22/2021   History of migraine headaches    Hyperlipemia    Myocardial infarction [facility])    Stroke [facility])    Past Surgical History:  Procedure Laterality Date   ABDOMINAL HYSTERECTOMY     BACK SURGERY     CARDIAC CATHETERIZATION     CORONARY/GRAFT ACUTE MI REVASCULARIZATION N/A 01/25/2020   Procedure: Coronary/Graft Acute MI Revascularization;  Surgeon: Iran Ouch, MD;  Location: [facility] INVASIVE CV LAB;  Service: Cardiovascular;  Laterality: N/A;   FRACTURE SURGERY     IR  CATHETER TUBE CHANGE  08/10/2021   LEFT HEART CATH AND CORONARY ANGIOGRAPHY N/A 01/25/2020   Procedure: LEFT HEART CATH AND CORONARY ANGIOGRAPHY;  Surgeon: Iran Ouch, MD;  Location: [facility] INVASIVE CV LAB;  Service: Cardiovascular;  Laterality: N/A;   SKIN GRAFT Right    TOTAL ABDOMINAL HYSTERECTOMY Bilateral     Allergies  Allergen Reactions   Levaquin [Levofloxacin In D5w] Anaphylaxis and Swelling   Iodinated Contrast Media Itching    Severe itching   Other     Dust mites and opiates (all of them)   Latex Dermatitis    Outpatient Encounter Medications as of 11/05/2023  Medication Sig   aspirin EC 81 MG tablet Take 81 mg by mouth daily.   Azelastine HCl 0.15 % SOLN Place 2 sprays into both nostrils 2 (two) times daily.   Cholecalciferol (VITAMIN D3) 1.25 MG (50000 UT) CAPS Take 1 capsule by mouth once a week.  every Mon for Supplement   clonazePAM (KLONOPIN) 0.5 MG tablet Take 0.5 tablets (0.25 mg total) by mouth at bedtime for 28 days. After 2 weeks, make PRN for 2 weeks then discontinue   diazepam (VALIUM) 10 MG tablet TAKE 1 TABLET BY MOUTH FOUR TIMES A DAY   ibuprofen (ADVIL) 800 MG tablet Take 800 mg by mouth every 8 (eight) hours as needed for mild pain or moderate pain.   isosorbide mononitrate (ISMO) 20 MG tablet Take 20 mg by mouth 2 (two) times daily  at 10 AM and 5 PM.   lansoprazole (PREVACID) 15 MG capsule Take 15 mg by mouth daily.   levothyroxine (SYNTHROID) 25 MCG tablet Take 25 mcg by mouth daily before breakfast.   losartan (COZAAR) 50 MG tablet Take 2 tablets (100 mg total) by mouth daily.   magnesium hydroxide (MILK OF MAGNESIA) 400 MG/5ML suspension Take 5 mLs by mouth every 6 (six) hours as needed for mild constipation.   nitroGLYCERIN (NITROSTAT) 0.4 MG SL tablet Place 0.4 mg under the tongue every 5 (five) minutes x 3 doses as needed for chest pain.    OLANZapine (ZYPREXA) 5 MG tablet Take 2.5 mg by mouth 2 (two) times daily.   ondansetron (ZOFRAN-ODT) 4 MG  disintegrating tablet Take 4 mg by mouth every 8 (eight) hours as needed for nausea or vomiting.   polyethylene glycol (MIRALAX / GLYCOLAX) 17 g packet Take 17 g by mouth daily.   ranolazine (RANEXA) 500 MG 12 hr tablet Take 500 mg by mouth 2 (two) times daily.   rosuvastatin (CRESTOR) 5 MG tablet Take 5 mg by mouth daily. Every Tuesday,Thursday,Sat   senna-docusate (SENOKOT-S) 8.6-50 MG tablet Take 2 tablets by mouth at bedtime.   sertraline (ZOLOFT) 100 MG tablet Take 100 mg by mouth daily.   Vibegron (GEMTESA) 75 MG TABS Take 75 mg by mouth daily.   No facility-administered encounter medications on file as of 11/05/2023.    Review of Systems  Immunization History  Administered Date(s) Administered   Influenza-Unspecified 04/28/2011, 06/14/2012   Moderna Sars-Covid-2 Vaccination 09/08/2019, 10/06/2019, 06/11/2020   Tdap 09/03/2013, 11/15/2016   Zoster Recombinant(Shingrix) 12/08/2022, 03/10/2023   Pertinent  Health Maintenance Due  Topic Date Due   Colonoscopy  10/10/2024 (Originally 07/09/2006)   OPHTHALMOLOGY EXAM  10/22/2024 (Originally 07/10/1971)   HEMOGLOBIN A1C  12/02/2023   FOOT EXAM  04/16/2024   MAMMOGRAM  11/29/2024   INFLUENZA VACCINE  Discontinued      11/26/2021    2:10 AM 02/09/2022    1:51 AM 03/01/2022    7:43 AM 05/28/2022    2:13 AM 12/07/2022    2:19 PM  Fall Risk  Falls in the past year?     Exclusion - non ambulatory  (RETIRED) Patient Fall Risk Level High fall risk Moderate fall risk Moderate fall risk Moderate fall risk    Functional Status Survey:    Vitals:   11/05/23 2213  BP: (!) 155/78  Pulse: 75  Resp: 17  Temp: 97.6 F (36.4 C)  SpO2: 96%  Weight: 188 lb 12.8 oz (85.6 kg)   Body mass index is 35.67 kg/m. Physical Exam Cardiovascular:     Rate and Rhythm: Normal rate.     Pulses: Normal pulses.  Pulmonary:     Effort: Pulmonary effort is normal.  Neurological:     Mental Status: She is alert. Mental status is at baseline.      Labs reviewed: Recent Labs    01/24/23 0307 03/01/23 0000 06/04/23 0000 09/03/23 0000 10/04/23 0832  NA 132*   < > 133* 135* 135  K 3.4*   < > 4.3 4.7 4.1  CL 98   < > 98* 100 97*  CO2 24   < > 28* 28* 26  GLUCOSE 178*  --   --   --  152*  BUN 8   < > 4 5 <5*  CREATININE 0.72   < > 0.8 0.8 0.71  CALCIUM 8.2*   < > 8.9 8.8 9.6   < > =  values in this interval not displayed.   Recent Labs    06/04/23 0000  AST 10*  ALT 7  ALKPHOS 91  ALBUMIN 3.8   Recent Labs    01/24/23 0307 03/01/23 0000 06/04/23 0000 09/03/23 0000 10/04/23 0832  WBC 9.0 8.4 10.8 10.6 10.3  NEUTROABS  --  6,208.00 8,143.00 7,929.00  --   HGB 9.3* 30.0* 10.1* 9.3* 11.1*  HCT 34.4*  --  33* 31* 36.6  MCV 76.8*  --   --   --  71.9*  PLT 283 358  --  393 435*   Lab Results  Component Value Date   TSH 2.44 06/04/2023   Lab Results  Component Value Date   HGBA1C 6.5 06/04/2023   Lab Results  Component Value Date   CHOL 218 (A) 11/29/2022   HDL 64 11/29/2022   LDLCALC 135 11/29/2022   TRIG 89 11/29/2022   CHOLHDL 5.3 01/26/2020    Significant Diagnostic Results in last 30 days:  No results found.  Assessment/Plan Nausea  Diarrhea, unspecified type Patient with symptoms consistent with medication side effects vs gastroenteritis. ECG today given recent ECG with notable Twave inversions in V3-V4 which were both present on last ECG 1 month ago. Continue isosorbide daily dosing. Continue BP checks. Zofran 4 for nausea.   Family/ staff Communication: nursing, will speek to [place].   Labs/tests ordered:  BMP,CBC  I spent greater than 20 minutes for the care of this patient in face to face time, chart review, clinical documentation, patient education.

## 2023-11-06 ENCOUNTER — Encounter: Payer: Self-pay | Admitting: Nurse Practitioner

## 2023-11-06 ENCOUNTER — Non-Acute Institutional Stay (SKILLED_NURSING_FACILITY): Payer: Self-pay | Admitting: Nurse Practitioner

## 2023-11-06 DIAGNOSIS — R197 Diarrhea, unspecified: Secondary | ICD-10-CM | POA: Diagnosis not present

## 2023-11-06 DIAGNOSIS — I25118 Atherosclerotic heart disease of native coronary artery with other forms of angina pectoris: Secondary | ICD-10-CM | POA: Diagnosis not present

## 2023-11-06 DIAGNOSIS — I10 Essential (primary) hypertension: Secondary | ICD-10-CM

## 2023-11-06 DIAGNOSIS — E871 Hypo-osmolality and hyponatremia: Secondary | ICD-10-CM

## 2023-11-06 DIAGNOSIS — D509 Iron deficiency anemia, unspecified: Secondary | ICD-10-CM

## 2023-11-06 DIAGNOSIS — F22 Delusional disorders: Secondary | ICD-10-CM

## 2023-11-06 NOTE — Progress Notes (Signed)
 Location:  Other [facility].  Nursing Home Room Number: [facility] [address]A Place of Service:  SNF [phone]) Abbey Chatters, NP  PCP: Earnestine Mealing, MD  Patient Care Team: Earnestine Mealing, MD as PCP - General (Family Medicine) Iran Ouch, MD as PCP - Cardiology (Cardiology)  Extended Emergency Contact Information Primary Emergency Contact: Penelope Galas of [place] Home Phone: [phone] Mobile Phone: [phone] Relation: Sister Secondary Emergency Contact: Barron Alvine Address: [address]          [place], [place] [address] Darden Amber of [place] Home Phone: [phone] Relation: Mother  Goals of care: Advanced Directive information    11/02/2023   10:31 AM  Advanced Directives  Does Patient Have a Medical Advance Directive? Yes  Type of Advance Directive Out of facility DNR (pink MOST or yellow form)  Does patient want to make changes to medical advance directive? No - Patient declined     Chief Complaint  Patient presents with   Medical Management of Chronic Issues    Medical Management of Chronic Issues.  Increase Blood Pressure.     HPI:  Pt is a 63 y.o. female seen today for medical management. Staff also reports increased Blood Pressure over the last few days and pt is concerned with this.  Pts bp has been ranging from 120/80- 201/106.  Today bp ranging from 150/90-180/100 She denies chest pains at this time but has had frequent episodes of chest pains.  She saw cardiology at [facility] clinic on 3/17 and Ranexa 500 mg BID started.  She continues to get ntg SL at least daily She was recently started on imdur ER 60 mg daily, no significant changes in chest pains noted  She has not complained of significant headaches since added.   She was seen by Dr Sydnee Cabal on 11/05/23 due to diarrhea.  She has been complaining to staff regarding constipation and been getting prune juice and PRn medication to help her bowel move resulting  in worsening diarrhea and multiple episodes over night.   Labs were drawn on 11/05/23 which noted normal BNP at 85.   Sodium noted to be 126. Previous sodium in Nov was 133.  Her zyprexa was increased to BID ~6 months ago due to worsening behaviors/hallucinations.   Hgb on recent lab showing anemia, hgb went from 9.3>11.4>9.4 No signs of excessive bruising or bleeding She reports she does not eat meat.      Past Medical History:  Diagnosis Date   Acute ST elevation myocardial infarction (STEMI) of inferolateral wall (HCC) 01/25/2020   Blind    Cholecystitis 04/22/2021   History of migraine headaches    Hyperlipemia    Myocardial infarction [facility])    Stroke [facility])    Past Surgical History:  Procedure Laterality Date   ABDOMINAL HYSTERECTOMY     BACK SURGERY     CARDIAC CATHETERIZATION     CORONARY/GRAFT ACUTE MI REVASCULARIZATION N/A 01/25/2020   Procedure: Coronary/Graft Acute MI Revascularization;  Surgeon: Iran Ouch, MD;  Location: [facility] INVASIVE CV LAB;  Service: Cardiovascular;  Laterality: N/A;   FRACTURE SURGERY     IR CATHETER TUBE CHANGE  08/10/2021   LEFT HEART CATH AND CORONARY ANGIOGRAPHY N/A 01/25/2020   Procedure: LEFT HEART CATH AND CORONARY ANGIOGRAPHY;  Surgeon: Iran Ouch, MD;  Location: [facility] INVASIVE CV LAB;  Service: Cardiovascular;  Laterality: N/A;   SKIN GRAFT Right    TOTAL ABDOMINAL HYSTERECTOMY Bilateral     Allergies  Allergen Reactions  Levaquin [Levofloxacin In D5w] Anaphylaxis and Swelling   Iodinated Contrast Media Itching    Severe itching   Other     Dust mites and opiates (all of them)   Latex Dermatitis    Outpatient Encounter Medications as of 11/06/2023  Medication Sig   aspirin EC 81 MG tablet Take 81 mg by mouth daily.   Azelastine HCl 0.15 % SOLN Place 2 sprays into both nostrils 2 (two) times daily.   Cholecalciferol (VITAMIN D3) 1.25 MG (50000 UT) CAPS Take 1 capsule by mouth once a week.  every Mon for Supplement    diazepam (VALIUM) 10 MG tablet TAKE 1 TABLET BY MOUTH FOUR TIMES A DAY   ferrous sulfate 325 (65 FE) MG EC tablet Take 325 mg by mouth. Every Monday, Wednesday and Friday.   ibuprofen (ADVIL) 800 MG tablet Take 800 mg by mouth every 8 (eight) hours as needed for mild pain or moderate pain.   isosorbide mononitrate (ISMO) 20 MG tablet Take 20 mg by mouth daily.   lansoprazole (PREVACID) 15 MG capsule Take 15 mg by mouth daily.   levothyroxine (SYNTHROID) 25 MCG tablet Take 25 mcg by mouth daily before breakfast.   losartan (COZAAR) 50 MG tablet Take 2 tablets (100 mg total) by mouth daily.   magnesium hydroxide (MILK OF MAGNESIA) 400 MG/5ML suspension Take 5 mLs by mouth every 6 (six) hours as needed for mild constipation.   nitroGLYCERIN (NITROSTAT) 0.4 MG SL tablet Place 0.4 mg under the tongue every 5 (five) minutes x 3 doses as needed for chest pain.    OLANZapine (ZYPREXA) 5 MG tablet Take 2.5 mg by mouth daily.   ondansetron (ZOFRAN-ODT) 4 MG disintegrating tablet Take 4 mg by mouth every 4 (four) hours as needed for nausea or vomiting.   polyethylene glycol (MIRALAX / GLYCOLAX) 17 g packet Take 17 g by mouth daily.   ranolazine (RANEXA) 500 MG 12 hr tablet Take 500 mg by mouth 2 (two) times daily.   rosuvastatin (CRESTOR) 5 MG tablet Take 5 mg by mouth daily. Every Tuesday,Thursday,Sat   senna-docusate (SENOKOT-S) 8.6-50 MG tablet Take 2 tablets by mouth at bedtime.   sertraline (ZOLOFT) 100 MG tablet Take 100 mg by mouth daily.   Vibegron (GEMTESA) 75 MG TABS Take 75 mg by mouth daily.   clonazePAM (KLONOPIN) 0.5 MG tablet Take 0.5 tablets (0.25 mg total) by mouth at bedtime for 28 days. After 2 weeks, make PRN for 2 weeks then discontinue   No facility-administered encounter medications on file as of 11/06/2023.    Review of Systems  Constitutional:  Negative for activity change, appetite change, fatigue and unexpected weight change.  HENT:  Negative for congestion and hearing loss.    Eyes: Negative.        Blind  Respiratory:  Negative for cough and shortness of breath.   Cardiovascular:  Positive for chest pain. Negative for palpitations and leg swelling.  Gastrointestinal:  Positive for constipation and diarrhea. Negative for abdominal pain.       Pt reports constipation but has multiple episodes of watery/loose stools  Genitourinary:  Positive for difficulty urinating. Negative for dysuria.       Chronic foley   Musculoskeletal:  Positive for arthralgias and gait problem. Negative for myalgias.  Skin:  Negative for color change and wound.  Neurological:  Negative for dizziness and weakness.  Psychiatric/Behavioral:  Positive for behavioral problems, confusion and hallucinations. Negative for agitation and suicidal ideas.  Immunization History  Administered Date(s) Administered   Influenza-Unspecified 04/28/2011, 06/14/2012   Moderna Sars-Covid-2 Vaccination 09/08/2019, 10/06/2019, 06/11/2020   Tdap 09/03/2013, 11/15/2016   Zoster Recombinant(Shingrix) 12/08/2022, 03/10/2023   Pertinent  Health Maintenance Due  Topic Date Due   Colonoscopy  10/10/2024 (Originally 07/09/2006)   OPHTHALMOLOGY EXAM  10/22/2024 (Originally 07/10/1971)   HEMOGLOBIN A1C  12/02/2023   FOOT EXAM  04/16/2024   MAMMOGRAM  11/29/2024   INFLUENZA VACCINE  Discontinued      11/26/2021    2:10 AM 02/09/2022    1:51 AM 03/01/2022    7:43 AM 05/28/2022    2:13 AM 12/07/2022    2:19 PM  Fall Risk  Falls in the past year?     Exclusion - non ambulatory  (RETIRED) Patient Fall Risk Level High fall risk Moderate fall risk Moderate fall risk Moderate fall risk    Functional Status Survey:    Vitals:   11/06/23 0930 11/06/23 1017  BP: (!) 155/78 (!) 155/78  Pulse: 75   Resp: 17   Temp: 97.6 F (36.4 C)   SpO2: 96%   Weight: 188 lb 12.8 oz (85.6 kg)   Height: 5\' 1"  (1.549 m)    Body mass index is 35.67 kg/m. Physical Exam Constitutional:      General: She is not in acute  distress.    Appearance: She is well-developed. She is not diaphoretic.  HENT:     Head: Normocephalic and atraumatic.     Mouth/Throat:     Pharynx: No oropharyngeal exudate.  Eyes:     Conjunctiva/sclera: Conjunctivae normal.     Pupils: Pupils are equal, round, and reactive to light.  Cardiovascular:     Rate and Rhythm: Normal rate and regular rhythm.     Heart sounds: Normal heart sounds.  Pulmonary:     Effort: Pulmonary effort is normal.     Breath sounds: Normal breath sounds.  Abdominal:     General: Bowel sounds are normal.     Palpations: Abdomen is soft.  Musculoskeletal:     Cervical back: Normal range of motion and neck supple.     Right lower leg: No edema.     Left lower leg: No edema.  Skin:    General: Skin is warm and dry.  Neurological:     Mental Status: She is alert. Mental status is at baseline.     Motor: Weakness present.     Gait: Gait abnormal.     Comments: Left sided hemiparesis   Psychiatric:        Mood and Affect: Mood normal.     Labs reviewed: Recent Labs    01/24/23 0307 03/01/23 0000 09/03/23 0000 10/04/23 0832 11/05/23 0000  NA 132*   < > 135* 135 126*  K 3.4*   < > 4.7 4.1 4.3  CL 98   < > 100 97* 90*  CO2 24   < > 28* 26 27*  GLUCOSE 178*  --   --  152*  --   BUN 8   < > 5 <5* 4  CREATININE 0.72   < > 0.8 0.71 0.7  CALCIUM 8.2*   < > 8.8 9.6 8.3*   < > = values in this interval not displayed.   Recent Labs    06/04/23 0000  AST 10*  ALT 7  ALKPHOS 91  ALBUMIN 3.8   Recent Labs    01/24/23 0307 03/01/23 0000 06/04/23 0000 09/03/23 0000 10/04/23 0832 11/05/23 0000  WBC 9.0 8.4 10.8 10.6 10.3 9.3  NEUTROABS  --  6,208.00 8,143.00 7,929.00  --   --   HGB 9.3* 30.0* 10.1* 9.3* 11.1* 9.4*  HCT 34.4*  --  33* 31* 36.6 31*  MCV 76.8*  --   --   --  71.9*  --   PLT 283 358  --  393 435* 405*   Lab Results  Component Value Date   TSH 2.44 06/04/2023   Lab Results  Component Value Date   HGBA1C 6.5 06/04/2023    Lab Results  Component Value Date   CHOL 218 (A) 11/29/2022   HDL 64 11/29/2022   LDLCALC 135 11/29/2022   TRIG 89 11/29/2022   CHOLHDL 5.3 01/26/2020    Significant Diagnostic Results in last 30 days:  No results found.  Assessment/Plan 1. Uncontrolled hypertension (Primary) Bp variable, continue on losartan 100 mg daily.  She has refused several medication in the past including metoprolol.  Will have staff check bp 1 hour after medication has been given over the next several days Low sodium diet.  If continues to be elevated consider norvasc 5 mg daily   2. Coronary artery disease of native artery of native heart with stable angina pectoris (HCC) -ongoing, continues on ranexa 500 mg BID and imdur 60 mg daily Imdur recently added due to ongoing chest pains, can titrate up if not improvement after a few days. Cardiologist called for further input as well due to htn and ongoing chest pains.   3. Diarrhea, unspecified type HOLD all medication for constipation while having diarrhea, may need to adjust regimen.   4. Delusional disorder (HCC) Ongoing, has been doing well but continues to hear/see things that are not there. Reports people in her room that are not there. Will decrease zyprexa due to hyponatremia and montior  5. Hyponatremia likely multifactorial due to her diarrhea, antidepressant and antipsychotic use.  She also drinks a lot of water due to mouth feeling dry.  Encoruaged to decrease water intake Will decrease zyprexa to daily Follow up bmp in 1 week.   6. Microcytic anemia -noted on labs, worse  Will get ifob x 3  Kristeena Meineke K. Biagio Borg [facility] [phone]    Total time 40 mins:  time greater than 50% of total time spent doing pt counseling and coordination of care with patient, family and specialist.  Dr Sydnee Cabal made aware and in agreement with plan.

## 2023-11-07 ENCOUNTER — Non-Acute Institutional Stay (SKILLED_NURSING_FACILITY): Payer: Self-pay | Admitting: Student

## 2023-11-07 ENCOUNTER — Encounter: Payer: Self-pay | Admitting: Student

## 2023-11-07 DIAGNOSIS — F22 Delusional disorders: Secondary | ICD-10-CM

## 2023-11-07 DIAGNOSIS — R079 Chest pain, unspecified: Secondary | ICD-10-CM | POA: Diagnosis not present

## 2023-11-07 DIAGNOSIS — E871 Hypo-osmolality and hyponatremia: Secondary | ICD-10-CM | POA: Diagnosis not present

## 2023-11-07 DIAGNOSIS — D509 Iron deficiency anemia, unspecified: Secondary | ICD-10-CM

## 2023-11-07 DIAGNOSIS — Z79899 Other long term (current) drug therapy: Secondary | ICD-10-CM

## 2023-11-07 NOTE — Progress Notes (Signed)
 Location:  Other [facility].  Nursing Home Room Number: [facility] [address]A Place of Service:  SNF [phone]) Provider:  Earnestine Mealing, MD  Patient Care Team: Earnestine Mealing, MD as PCP - General (Family Medicine) Iran Ouch, MD as PCP - Cardiology (Cardiology)  Extended Emergency Contact Information Primary Emergency Contact: Penelope Galas of [place] Home Phone: [phone] Mobile Phone: [phone] Relation: Sister Secondary Emergency Contact: Barron Alvine Address: [address]          [place], [place] [address] Darden Amber of [place] Home Phone: [phone] Relation: Mother  Code Status:  DNR Goals of care: Advanced Directive information    11/02/2023   10:31 AM  Advanced Directives  Does Patient Have a Medical Advance Directive? Yes  Type of Advance Directive Out of facility DNR (pink MOST or yellow form)  Does patient want to make changes to medical advance directive? No - Patient declined     Chief Complaint  Patient presents with   Altered Mental Status    Confusion.     HPI:  Pt is a 63 y.o. female seen today for an acute visit for Confusion.   History of Present Illness The patient presents with confusion and disorientation.  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 She also had difficulty recalling the current year, stating 'fifteen twenty five' instead of 2025. She feels clearer now compared to earlier in the day.  She has a history of low sodium levels, with a recent measurement of 126, down from 133 in January and 136 prior to that. Medications have been adjusted due to this issue.  She has a history of chest pain, which has subsided since starting Imdur. No chest pain today, but she mentioned having it in the afternoon previously.  She is anemic and has been for several years, with  a noted worsening of the condition.   Past Medical History:  Diagnosis Date   Acute ST elevation myocardial infarction (STEMI) of inferolateral wall (HCC) 01/25/2020   Blind    Cholecystitis 04/22/2021   History of migraine headaches    Hyperlipemia    Myocardial infarction [facility])    Stroke [facility])    Past Surgical History:  Procedure Laterality Date   ABDOMINAL HYSTERECTOMY     BACK SURGERY     CARDIAC CATHETERIZATION     CORONARY/GRAFT ACUTE MI REVASCULARIZATION N/A 01/25/2020   Procedure: Coronary/Graft Acute MI Revascularization;  Surgeon: Iran Ouch, MD;  Location: [facility] INVASIVE CV LAB;  Service: Cardiovascular;  Laterality: N/A;   FRACTURE SURGERY     IR CATHETER TUBE CHANGE  08/10/2021   LEFT HEART CATH AND CORONARY ANGIOGRAPHY N/A 01/25/2020   Procedure: LEFT HEART CATH AND CORONARY ANGIOGRAPHY;  Surgeon: Iran Ouch, MD;  Location: [facility] INVASIVE CV LAB;  Service: Cardiovascular;  Laterality: N/A;   SKIN GRAFT Right    TOTAL ABDOMINAL HYSTERECTOMY Bilateral     Allergies  Allergen Reactions   Levaquin [Levofloxacin In D5w] Anaphylaxis and Swelling   Iodinated Contrast Media Itching    Severe itching   Other     Dust mites and opiates (all of them)   Latex Dermatitis    Outpatient Encounter Medications as of 11/07/2023  Medication Sig   alum & mag hydroxide-simeth (MAALOX PLUS) 400-400-40 MG/5ML suspension Take 15 mLs by mouth every 4 (four) hours as needed for indigestion.  aspirin EC 81 MG tablet Take 81 mg by mouth daily.   Azelastine HCl 0.15 % SOLN Place 2 sprays into both nostrils 2 (two) times daily.   Cholecalciferol (VITAMIN D3) 1.25 MG (50000 UT) CAPS Take 1 capsule by mouth once a week.  every Mon for Supplement   diazepam (VALIUM) 10 MG tablet TAKE 1 TABLET BY MOUTH FOUR TIMES A DAY   ferrous sulfate 325 (65 FE) MG EC tablet Take 325 mg by mouth. Every Monday, Wednesday and Friday.   ibuprofen (ADVIL) 800 MG tablet Take 800 mg by mouth every 8  (eight) hours as needed for mild pain or moderate pain.   isosorbide mononitrate (IMDUR) 60 MG 24 hr tablet Take 60 mg by mouth daily.   lansoprazole (PREVACID) 15 MG capsule Take 15 mg by mouth daily.   levothyroxine (SYNTHROID) 25 MCG tablet Take 25 mcg by mouth daily before breakfast.   losartan (COZAAR) 100 MG tablet Take 100 mg by mouth daily.   magnesium hydroxide (MILK OF MAGNESIA) 400 MG/5ML suspension Take 5 mLs by mouth every 6 (six) hours as needed for mild constipation.   nitroGLYCERIN (NITROSTAT) 0.4 MG SL tablet Place 0.4 mg under the tongue every 5 (five) minutes x 3 doses as needed for chest pain.    OLANZapine (ZYPREXA) 5 MG tablet Take 2.5 mg by mouth daily.   ondansetron (ZOFRAN-ODT) 4 MG disintegrating tablet Take 4 mg by mouth every 4 (four) hours as needed for nausea or vomiting.   polyethylene glycol (MIRALAX / GLYCOLAX) 17 g packet Take 17 g by mouth daily.   ranolazine (RANEXA) 500 MG 12 hr tablet Take 500 mg by mouth 2 (two) times daily.   rosuvastatin (CRESTOR) 5 MG tablet Take 5 mg by mouth daily. Every Tuesday,Thursday,Sat   senna-docusate (SENOKOT-S) 8.6-50 MG tablet Take 2 tablets by mouth at bedtime.   sertraline (ZOLOFT) 100 MG tablet Take 100 mg by mouth daily.   Vibegron (GEMTESA) 75 MG TABS Take 75 mg by mouth daily.   No facility-administered encounter medications on file as of 11/07/2023.    Review of Systems  Immunization History  Administered Date(s) Administered   Influenza-Unspecified 04/28/2011, 06/14/2012   Moderna Sars-Covid-2 Vaccination 09/08/2019, 10/06/2019, 06/11/2020   Tdap 09/03/2013, 11/15/2016   Zoster Recombinant(Shingrix) 12/08/2022, 03/10/2023   Pertinent  Health Maintenance Due  Topic Date Due   Colonoscopy  10/10/2024 (Originally 07/09/2006)   OPHTHALMOLOGY EXAM  10/22/2024 (Originally 07[birth year]10/1971)   HEMOGLOBIN A1C  12/02/2023   FOOT EXAM  04/16/2024   MAMMOGRAM  11/29/2024   INFLUENZA VACCINE  Discontinued      11/26/2021     2:10 AM 02/09/2022    1:51 AM 03/01/2022    7:43 AM 05/28/2022    2:13 AM 12/07/2022    2:19 PM  Fall Risk  Falls in the past year?     Exclusion - non ambulatory  (RETIRED) Patient Fall Risk Level High fall risk Moderate fall risk Moderate fall risk Moderate fall risk    Functional Status Survey:    Vitals:   11/07/23 1130 11/07/23 1146  BP: (!) 164/98 (!) 155/78  Pulse: 78   Resp: 17   Temp: 97.6 F (36.4 C)   SpO2: 96%   Weight: 188 lb 12.8 oz (85.6 kg)   Height: 5\' 1"  (1.549 m)    Body mass index is 35.67 kg/m. Physical Exam Cardiovascular:     Rate and Rhythm: Normal rate.     Pulses: Normal pulses.  Pulmonary:  Effort: Pulmonary effort is normal.  Neurological:     Mental Status: She is alert.     Comments: Oriented to self, place, time     Labs reviewed: Recent Labs    01/24/23 0307 03/01/23 0000 09/03/23 0000 10/04/23 0832 11/05/23 0000  NA 132*   < > 135* 135 126*  K 3.4*   < > 4.7 4.1 4.3  CL 98   < > 100 97* 90*  CO2 24   < > 28* 26 27*  GLUCOSE 178*  --   --  152*  --   BUN 8   < > 5 <5* 4  CREATININE 0.72   < > 0.8 0.71 0.7  CALCIUM 8.2*   < > 8.8 9.6 8.3*   < > = values in this interval not displayed.   Recent Labs    06/04/23 0000  AST 10*  ALT 7  ALKPHOS 91  ALBUMIN 3.8   Recent Labs    01/24/23 0307 03/01/23 0000 06/04/23 0000 09/03/23 0000 10/04/23 0832 11/05/23 0000  WBC 9.0 8.4 10.8 10.6 10.3 9.3  NEUTROABS  --  6,208.00 8,143.00 7,929.00  --   --   HGB 9.3* 30.0* 10.1* 9.3* 11.1* 9.4*  HCT 34.4*  --  33* 31* 36.6 31*  MCV 76.8*  --   --   --  71.9*  --   PLT 283 358  --  393 435* 405*   Lab Results  Component Value Date   TSH 2.44 06/04/2023   Lab Results  Component Value Date   HGBA1C 6.5 06/04/2023   Lab Results  Component Value Date   CHOL 218 (A) 11/29/2022   HDL 64 11/29/2022   LDLCALC 135 11/29/2022   TRIG 89 11/29/2022   CHOLHDL 5.3 01/26/2020    Significant Diagnostic Results in last 30 days:   No results found.  Assessment/Plan Hyponatremia Delusional disorder Hyponatremia with sodium level of 126, decreased from previous levels of 133 and 136, likely medication-induced. Symptoms of confusion and disorientation have improved with medication adjustment. Goal is to normalize sodium levels to alleviate symptoms. - Decrease dose of SSRI- sertraline to 50 mg - continue zyprexa 5 mg daily - consider salt tablets if continuing to decrease.  - fluid restriction - Decrease medication dosing - Check sodium levels tomorrow  Chest Pain Intermittent chest pain subsided with Imdur, likely related to blood pressure issues. Continued management with Imdur has been effective in reducing symptoms. - Continue Imdur  Anemia Chronic anemia worsening over several years, cause under investigation. Monitoring iron levels to assess severity and guide management. - Check iron levels tomorrow  Follow-up Monitor and reassess condition. - Check white blood cell count tomorrow - Schedule next appointment  Family/ staff Communication: nursing  Labs/tests ordered:  BMP, CBC

## 2023-11-08 LAB — BASIC METABOLIC PANEL WITH GFR
BUN: 4 (ref 4–21)
CO2: 24 — AB (ref 13–22)
Chloride: 93 — AB (ref 99–108)
Creatinine: 0.8 (ref 0.5–1.1)
Glucose: 82
Potassium: 4.4 meq/L (ref 3.5–5.1)

## 2023-11-08 LAB — HEPATIC FUNCTION PANEL
ALT: 10 U/L (ref 7–35)
AST: 25 (ref 13–35)
Alkaline Phosphatase: 94 (ref 25–125)
Bilirubin, Total: 0.3

## 2023-11-08 LAB — IRON,TIBC AND FERRITIN PANEL
%SAT: 6
Ferritin: 12
Iron: 25
TIBC: 398

## 2023-11-08 LAB — COMPREHENSIVE METABOLIC PANEL WITH GFR
Albumin: 3.9 (ref 3.5–5.0)
Calcium: 8.7 (ref 8.7–10.7)
Globulin: 2.8
eGFR: 81

## 2023-11-08 LAB — CBC AND DIFFERENTIAL
HCT: 35 — AB (ref 36–46)
Hemoglobin: 10.7 — AB (ref 12.0–16.0)
Neutrophils Absolute: 6222
Platelets: 489 10*3/uL — AB (ref 150–400)
WBC: 9.3

## 2023-11-08 LAB — CBC: RBC: 4.94 (ref 3.87–5.11)

## 2023-11-09 ENCOUNTER — Other Ambulatory Visit: Payer: Self-pay | Admitting: Student

## 2023-11-09 DIAGNOSIS — Z79899 Other long term (current) drug therapy: Secondary | ICD-10-CM

## 2023-11-09 MED ORDER — DIAZEPAM 10 MG PO TABS: 10.0000 mg | ORAL_TABLET | Freq: Four times a day (QID) | ORAL | 0 refills | Status: DC

## 2023-11-09 NOTE — Progress Notes (Signed)
 Chronic anxiety medication sent

## 2023-11-11 ENCOUNTER — Other Ambulatory Visit: Payer: Self-pay

## 2023-11-11 ENCOUNTER — Emergency Department
Admission: EM | Admit: 2023-11-11 | Discharge: 2023-11-12 | Disposition: A | Discharge status: Skilled Nursing Facility | Attending: Emergency Medicine | Admitting: Emergency Medicine

## 2023-11-11 DIAGNOSIS — Z8673 Personal history of transient ischemic attack (TIA), and cerebral infarction without residual deficits: Secondary | ICD-10-CM | POA: Diagnosis not present

## 2023-11-11 DIAGNOSIS — R112 Nausea with vomiting, unspecified: Secondary | ICD-10-CM | POA: Diagnosis present

## 2023-11-11 DIAGNOSIS — D72829 Elevated white blood cell count, unspecified: Secondary | ICD-10-CM | POA: Insufficient documentation

## 2023-11-11 DIAGNOSIS — R197 Diarrhea, unspecified: Secondary | ICD-10-CM | POA: Insufficient documentation

## 2023-11-11 LAB — URINALYSIS, ROUTINE W REFLEX MICROSCOPIC
Bilirubin Urine: NEGATIVE
Bilirubin Urine: NEGATIVE
Glucose, UA: NEGATIVE mg/dL
Glucose, UA: NEGATIVE mg/dL
Ketones, ur: NEGATIVE mg/dL
Ketones, ur: NEGATIVE mg/dL
Nitrite: POSITIVE — AB
Nitrite: NEGATIVE
Protein, ur: NEGATIVE mg/dL
Protein, ur: NEGATIVE mg/dL
Specific Gravity, Urine: 1.001 — ABNORMAL LOW (ref 1.005–1.030)
Specific Gravity, Urine: 1.001 — ABNORMAL LOW (ref 1.005–1.030)
Squamous Epithelial / HPF: 0 /HPF (ref 0–5)
pH: 7 (ref 5.0–8.0)
pH: 8 (ref 5.0–8.0)

## 2023-11-11 LAB — LIPASE, BLOOD: Lipase: 36 U/L (ref 11–51)

## 2023-11-11 LAB — COMPREHENSIVE METABOLIC PANEL WITH GFR
ALT: 12 U/L (ref 0–44)
AST: 16 U/L (ref 15–41)
Albumin: 4 g/dL (ref 3.5–5.0)
Alkaline Phosphatase: 98 U/L (ref 38–126)
Anion gap: 12 (ref 5–15)
BUN: <5 mg/dL — ABNORMAL LOW (ref 8–23)
CO2: 24 mmol/L (ref 22–32)
Calcium: 9 mg/dL (ref 8.9–10.3)
Chloride: 94 mmol/L — ABNORMAL LOW (ref 98–111)
Creatinine, Ser: 0.73 mg/dL (ref 0.44–1.00)
GFR, Estimated: >60 mL/min (ref >60)
Glucose, Bld: 155 mg/dL — ABNORMAL HIGH (ref 70–99)
Potassium: 4.1 mmol/L (ref 3.5–5.1)
Sodium: 130 mmol/L — ABNORMAL LOW (ref 135–145)
Total Bilirubin: 0.7 mg/dL (ref 0.0–1.2)
Total Protein: 7.4 g/dL (ref 6.5–8.1)

## 2023-11-11 LAB — CBC
HCT: 31.8 % — ABNORMAL LOW (ref 36.0–46.0)
Hemoglobin: 10.2 g/dL — ABNORMAL LOW (ref 12.0–15.0)
MCH: 22 pg — ABNORMAL LOW (ref 26.0–34.0)
MCHC: 32.1 g/dL (ref 30.0–36.0)
MCV: 68.7 fL — ABNORMAL LOW (ref 80.0–100.0)
Platelets: 572 10*3/uL — ABNORMAL HIGH (ref 150–400)
RBC: 4.63 MIL/uL (ref 3.87–5.11)
RDW: 17.3 % — ABNORMAL HIGH (ref 11.5–15.5)
WBC: 13.4 10*3/uL — ABNORMAL HIGH (ref 4.0–10.5)
nRBC: 0 % (ref 0.0–0.2)

## 2023-11-11 MED ORDER — PROMETHAZINE HCL 12.5 MG RE SUPP: 12.5000 mg | Freq: Four times a day (QID) | RECTAL | 0 refills | Status: DC | PRN

## 2023-11-11 MED ORDER — SODIUM CHLORIDE 0.9 % IV BOLUS
1000.0000 mL | Freq: Once | INTRAVENOUS | Status: AC
  Administered 2023-11-11: 1000 mL via INTRAVENOUS

## 2023-11-11 MED ORDER — IBUPROFEN 400 MG PO TABS
400.0000 mg | ORAL_TABLET | Freq: Once | ORAL | Status: AC
  Administered 2023-11-11: 400 mg via ORAL
  Filled 2023-11-11: qty 1

## 2023-11-11 MED ORDER — ONDANSETRON HCL 4 MG/2ML IJ SOLN
4.0000 mg | Freq: Once | INTRAMUSCULAR | Status: AC
  Administered 2023-11-11: 4 mg via INTRAVENOUS
  Filled 2023-11-11: qty 2

## 2023-11-11 NOTE — ED Notes (Signed)
 Consulted to other staff RN for ultrasound IV. Two RN's have previously attempted to gain vein ac sess and were not successful.

## 2023-11-11 NOTE — ED Notes (Signed)
 Communicated to MD that patient has a supra pubic catheter not an indwelling foley. MD advised not to pull out supra pubic catheter.

## 2023-11-11 NOTE — ED Triage Notes (Addendum)
 Pt to ED via [facility] from [facility]. N/V/D and abd pain that started today. Facility gave Zofran. Pt reports medicine is not helping. Pt with hx CVA. Pt is blind. Pt A&Ox4 per EMS.   182/103 96% RA 85 HR  161 CBG

## 2023-11-11 NOTE — ED Notes (Signed)
 Urology cart called for CCRN Charge to change suprapubic catheter.

## 2023-11-11 NOTE — ED Notes (Signed)
 Report called to Narjette LPN at [facility].

## 2023-11-11 NOTE — ED Notes (Signed)
 Received handoff from Zach, [place]

## 2023-11-11 NOTE — ED Notes (Addendum)
 CCRN in room to change Supra Pubic Catheter.

## 2023-11-11 NOTE — ED Provider Notes (Signed)
 [facility] Provider Note    Event Date/Time   First MD Initiated Contact with Patient 11/11/23 1509     (approximate)   History   Emesis   HPI  Victoria Medina is a 63 y.o. female who presents to the emergency department today from living facility because of concerns for vomiting and diarrhea.  The patient states that her initially started with some diarrhea.  She then felt she did not fully evacuated so drink some prune juice.  She states afterwards she did have profound watery diarrhea.  She also had nausea with some vomiting.  She denied any abdominal pain to myself.  Denies any fevers or chills.     Physical Exam   Triage Vital Signs: ED Triage Vitals [11/11/23 1334]  Encounter Vitals Group     BP 123/88     Systolic BP Percentile      Diastolic BP Percentile      Pulse Rate 89     Resp 18     Temp 98.3 F (36.8 C)     Temp Source Oral     SpO2 94 %     Weight      Height      Head Circumference      Peak Flow      Pain Score      Pain Loc      Pain Education      Exclude from Growth Chart     Most recent vital signs: Vitals:   11/11/23 1334  BP: 123/88  Pulse: 89  Resp: 18  Temp: 98.3 F (36.8 C)  SpO2: 94%   General: Awake, alert, oriented. CV:  Good peripheral perfusion. Regular rate and rhythm. Resp:  Normal effort. Lungs clear. Abd:  No distention. Non tender.  ED Results / Procedures / Treatments   Labs (all labs ordered are listed, but only abnormal results are displayed) Labs Reviewed  COMPREHENSIVE METABOLIC PANEL WITH GFR - Abnormal; Notable for the following components:      Result Value   Sodium 130 (*)    Chloride 94 (*)    Glucose, Bld 155 (*)    BUN <5 (*)    All other components within normal limits  CBC - Abnormal; Notable for the following components:   WBC 13.4 (*)    Hemoglobin 10.2 (*)    HCT 31.8 (*)    MCV 68.7 (*)    MCH 22.0 (*)    RDW 17.3 (*)    Platelets 572 (*)    All other  components within normal limits  URINALYSIS, ROUTINE W REFLEX MICROSCOPIC - Abnormal; Notable for the following components:   Color, Urine STRAW (*)    APPearance HAZY (*)    Specific Gravity, Urine 1.001 (*)    Hgb urine dipstick SMALL (*)    Nitrite POSITIVE (*)    Leukocytes,Ua LARGE (*)    Bacteria, UA RARE (*)    All other components within normal limits  LIPASE, BLOOD     EKG  I, Marylynn Soho, attending physician, personally viewed and interpreted this EKG  EKG Time: 1338 Rate: 90 Rhythm: normal sinus rhythm Axis: left axis deviation Intervals: qtc 462 QRS: narrow, q waves v1 ST changes: no st elevation Impression: abnormal ekg   RADIOLOGY None   PROCEDURES:  Critical Care performed: No    MEDICATIONS ORDERED IN ED: Medications - No data to display   IMPRESSION / MDM / ASSESSMENT AND PLAN / ED  COURSE  I reviewed the triage vital signs and the nursing notes.                              Differential diagnosis includes, but is not limited to, gastroenteritis, food poisoning, UTI  Patient's presentation is most consistent with acute presentation with potential threat to life or bodily function.   Patient presented to the emergency department today with nausea vomiting diarrhea.  Patient denied any abdominal pain to myself.  On exam patient's abdomen is nontender.  It is soft.  Blood work does show slight leukocytosis.  UA is concerning for infection however patient does have an indwelling suprapubic catheter.  Given benign abdominal exam we will start treatment with IV fluids and Zofran.   Patient did feel better after IV fluids and zofran.  She was able to tolerate p.o.  At this time given clinical improvement and benign exam I do not think emergent abdominal imaging is necessary.  Will plan on discharging.      FINAL CLINICAL IMPRESSION(S) / ED DIAGNOSES   Final diagnoses:  Nausea vomiting and diarrhea     Note:  This document was prepared  using Dragon voice recognition software and may include unintentional dictation errors.    Marylynn Soho, MD 11/11/23 2330

## 2023-11-12 ENCOUNTER — Encounter: Payer: Self-pay | Admitting: Student

## 2023-11-12 ENCOUNTER — Non-Acute Institutional Stay (SKILLED_NURSING_FACILITY): Payer: Self-pay | Admitting: Student

## 2023-11-12 DIAGNOSIS — K529 Noninfective gastroenteritis and colitis, unspecified: Secondary | ICD-10-CM

## 2023-11-12 DIAGNOSIS — N390 Urinary tract infection, site not specified: Secondary | ICD-10-CM

## 2023-11-12 DIAGNOSIS — Z9359 Other cystostomy status: Secondary | ICD-10-CM

## 2023-11-12 NOTE — Progress Notes (Unsigned)
 Location:  Other [facility].  Nursing Home Room Number: [facility] [address]A Place of Service:  SNF [phone]) Provider:  Earnestine Mealing, MD  Patient Care Team: Earnestine Mealing, MD as PCP - General (Family Medicine) Iran Ouch, MD as PCP - Cardiology (Cardiology)  Extended Emergency Contact Information Primary Emergency Contact: Penelope Galas of [place] Home Phone: [phone] Mobile Phone: [phone] Relation: Sister Secondary Emergency Contact: Barron Alvine Address: [address]          [place], [place] [address] Darden Amber of [place] Home Phone: [phone] Relation: Mother  Code Status:  DNR Goals of care: Advanced Directive information    11/11/2023    1:26 PM  Advanced Directives  Does Patient Have a Medical Advance Directive? Yes  Type of Advance Directive Out of facility DNR (pink MOST or yellow form)     Chief Complaint  Patient presents with  . Hospitalization Follow-up    ED Follow up    HPI:  Pt is a 63 y.o. female seen today for ED Follow up History of Present Illness The patient presents with a recent hospitalization for a urinary tract infection and catheter change.  Recently hospitalized for a urinary tract infection, she underwent a catheter change during the visit. The catheter is typically changed every thirty days, but she has not had a recent appointment with a urologist. The last change was in March, and the hospital staff performed the change during the recent visit.  She has a history of catheter use and requires regular changes every 30 days. There was a lapse in the regular schedule, leading to a change being performed in the hospital. She does not currently have an appointment with a urologist for ongoing catheter management.  She experienced nausea and vomiting, which she attributes to dietary issues, stating 'I do it all the time.' She took Zofran after vomiting, but it did not prevent the episode. The  hospital diagnosed her with a gastrointestinal virus, likely gastroenteritis, which was associated with vomiting and diarrhea. No current chest pain and she reports feeling better since the hospital visit.  Past Medical History - Urinary tract infection - Gastroenteritis  Medications - Zofran  Assessment and Plan  Past Medical History:  Diagnosis Date  . Acute ST elevation myocardial infarction (STEMI) of inferolateral wall (HCC) 01/25/2020  . Blind   . Cholecystitis 04/22/2021  . History of migraine headaches   . Hyperlipemia   . Myocardial infarction (HCC)   . Stroke [facility])    Past Surgical History:  Procedure Laterality Date  . ABDOMINAL HYSTERECTOMY    . BACK SURGERY    . CARDIAC CATHETERIZATION    . CORONARY/GRAFT ACUTE MI REVASCULARIZATION N/A 01/25/2020   Procedure: Coronary/Graft Acute MI Revascularization;  Surgeon: Iran Ouch, MD;  Location: [facility] INVASIVE CV LAB;  Service: Cardiovascular;  Laterality: N/A;  . FRACTURE SURGERY    . IR CATHETER TUBE CHANGE  08/10/2021  . LEFT HEART CATH AND CORONARY ANGIOGRAPHY N/A 01/25/2020   Procedure: LEFT HEART CATH AND CORONARY ANGIOGRAPHY;  Surgeon: Iran Ouch, MD;  Location: [facility] INVASIVE CV LAB;  Service: Cardiovascular;  Laterality: N/A;  . SKIN GRAFT Right   . TOTAL ABDOMINAL HYSTERECTOMY Bilateral     Allergies  Allergen Reactions  . Levaquin [Levofloxacin In D5w] Anaphylaxis and Swelling  . Iodinated Contrast Media Itching    Severe itching  . Other     Dust mites and opiates (all of them)  . Latex Dermatitis  Outpatient Encounter Medications as of 11/12/2023  Medication Sig  . alum & mag hydroxide-simeth (MAALOX PLUS) 400-400-40 MG/5ML suspension Take 15 mLs by mouth every 4 (four) hours as needed for indigestion.  Aaron Aas aspirin EC 81 MG tablet Take 81 mg by mouth daily.  . Azelastine HCl 0.15 % SOLN Place 2 sprays into both nostrils 2 (two) times daily.  . Cholecalciferol (VITAMIN D3) 1.25 MG (50000  UT) CAPS Take 1 capsule by mouth once a week.  every Mon for Supplement  . diazepam (VALIUM) 10 MG tablet Take 1 tablet (10 mg total) by mouth 4 (four) times daily.  . ferrous sulfate 325 (65 FE) MG EC tablet Take 325 mg by mouth. Every Monday, Wednesday and Friday.  Aaron Aas ibuprofen (ADVIL) 800 MG tablet Take 800 mg by mouth every 8 (eight) hours as needed for mild pain or moderate pain.  . isosorbide mononitrate (IMDUR) 60 MG 24 hr tablet Take 60 mg by mouth daily.  . lansoprazole (PREVACID) 15 MG capsule Take 15 mg by mouth 2 (two) times daily before a meal.  . levothyroxine (SYNTHROID) 25 MCG tablet Take 25 mcg by mouth daily before breakfast.  . losartan (COZAAR) 100 MG tablet Take 100 mg by mouth daily.  . magnesium hydroxide (MILK OF MAGNESIA) 400 MG/5ML suspension Take 5 mLs by mouth every 6 (six) hours as needed for mild constipation.  . nitroGLYCERIN (NITROSTAT) 0.4 MG SL tablet Place 0.4 mg under the tongue every 5 (five) minutes x 3 doses as needed for chest pain.   Aaron Aas OLANZapine (ZYPREXA) 5 MG tablet Take 5 mg by mouth daily.  . ondansetron (ZOFRAN-ODT) 4 MG disintegrating tablet Take 4 mg by mouth every 4 (four) hours as needed for nausea or vomiting.  . polyethylene glycol (MIRALAX / GLYCOLAX) 17 g packet Take 17 g by mouth daily.  . promethazine (PHENERGAN) 12.5 MG suppository Place 1 suppository (12.5 mg total) rectally every 6 (six) hours as needed for nausea or vomiting.  . ranolazine (RANEXA) 500 MG 12 hr tablet Take 500 mg by mouth 2 (two) times daily.  . rosuvastatin (CRESTOR) 5 MG tablet Take 5 mg by mouth daily. Every Tuesday,Thursday,Sat  . senna-docusate (SENOKOT-S) 8.6-50 MG tablet Take 2 tablets by mouth at bedtime.  . sertraline (ZOLOFT) 100 MG tablet Take 50 mg by mouth daily.  . Vibegron (GEMTESA) 75 MG TABS Take 75 mg by mouth daily.   No facility-administered encounter medications on file as of 11/12/2023.    Review of Systems  Immunization History  Administered  Date(s) Administered  . Influenza-Unspecified 04/28/2011, 06/14/2012  . Moderna Sars-Covid-2 Vaccination 09/08/2019, 10/06/2019, 06/11/2020  . Tdap 09/03/2013, 11/15/2016  . Zoster Recombinant(Shingrix) 12/08/2022, 03/10/2023   Pertinent  Health Maintenance Due  Topic Date Due  . Colonoscopy  10/10/2024 (Originally 07/09/2006)  . OPHTHALMOLOGY EXAM  10/22/2024 (Originally [birth year]/1971)  . HEMOGLOBIN A1C  12/02/2023  . FOOT EXAM  04/16/2024  . MAMMOGRAM  11/29/2024  . INFLUENZA VACCINE  Discontinued      11/26/2021    2:10 AM 02/09/2022    1:51 AM 03/01/2022    7:43 AM 05/28/2022    2:13 AM 12/07/2022    2:19 PM  Fall Risk  Falls in the past year?     Exclusion - non ambulatory  (RETIRED) Patient Fall Risk Level High fall risk Moderate fall risk Moderate fall risk Moderate fall risk    Functional Status Survey:    Vitals:   11/12/23 0926  BP: 130/78  Pulse:  84  Resp: 17  Temp: 97.6 F (36.4 C)  SpO2: 96%  Weight: 188 lb 12.8 oz (85.6 kg)  Height: 5\' 1"  (1.549 m)   Body mass index is 35.67 kg/m. Physical Exam  Labs reviewed: Recent Labs    01/24/23 0307 03/01/23 0000 10/04/23 0832 11/05/23 0000 11/08/23 0000 11/11/23 1336  NA 132*   < > 135 126*  --  130*  K 3.4*   < > 4.1 4.3 4.4 4.1  CL 98   < > 97* 90* 93* 94*  CO2 24   < > 26 27* 24* 24  GLUCOSE 178*  --  152*  --   --  155*  BUN 8   < > <5* 4 4 <5*  CREATININE 0.72   < > 0.71 0.7 0.8 0.73  CALCIUM 8.2*   < > 9.6 8.3* 8.7 9.0   < > = values in this interval not displayed.   Recent Labs    06/04/23 0000 11/08/23 0000 11/11/23 1336  AST 10* 25 16  ALT 7 10 12   ALKPHOS 91 94 98  BILITOT  --   --  0.7  PROT  --   --  7.4  ALBUMIN 3.8 3.9 4.0   Recent Labs    01/24/23 0307 03/01/23 0000 06/04/23 0000 09/03/23 0000 10/04/23 0832 11/05/23 0000 11/08/23 0000 11/11/23 1336  WBC 9.0   < > 10.8 10.6 10.3 9.3 9.3 13.4*  NEUTROABS  --    < > 8,143.00 7,929.00  --   --  6,222.00  --   HGB 9.3*   < >  10.1* 9.3* 11.1* 9.4* 10.7* 10.2*  HCT 34.4*  --  33* 31* 36.6 31* 35* 31.8*  MCV 76.8*  --   --   --  71.9*  --   --  68.7*  PLT 283   < >  --  393 435* 405* 489* 572*   < > = values in this interval not displayed.   Lab Results  Component Value Date   TSH 2.44 06/04/2023   Lab Results  Component Value Date   HGBA1C 6.5 06/04/2023   Lab Results  Component Value Date   CHOL 218 (A) 11/29/2022   HDL 64 11/29/2022   LDLCALC 135 11/29/2022   TRIG 89 11/29/2022   CHOLHDL 5.3 01/26/2020    Significant Diagnostic Results in last 30 days:  No results found.  Assessment/Plan  Suprapubic Catheter Management Requires regular catheter changes every thirty days. A lapse in the scheduled change led to hospitalization where the catheter was changed. Currently without a urologist and lacks scheduled appointments for future catheter changes. - Arrange follow-up with a urologist for regular catheter management. - Ensure catheter changes are scheduled every thirty days.  Urinary Tract Infection Recently hospitalized for a urinary tract infection, necessitating a catheter change. Managed in the hospital with no ongoing symptoms reported.  Gastroenteritis Experienced nausea, vomiting, and diarrhea attributed to a gastrointestinal virus. Symptoms have resolved. Zofran was taken for nausea but was ineffective as it was administered post-emesis.   Family/ staff Communication: ***  Labs/tests ordered:  ***

## 2023-11-12 NOTE — ED Notes (Signed)
 [facility] has arrived to pick up patient to transport back to [facility]

## 2023-11-14 ENCOUNTER — Encounter: Payer: Self-pay | Admitting: Student

## 2023-11-17 ENCOUNTER — Other Ambulatory Visit: Payer: Self-pay

## 2023-11-17 ENCOUNTER — Emergency Department
Admission: EM | Admit: 2023-11-17 | Discharge: 2023-11-17 | Disposition: A | Discharge status: Home / Self Care | Attending: Emergency Medicine | Admitting: Emergency Medicine

## 2023-11-17 DIAGNOSIS — K5900 Constipation, unspecified: Secondary | ICD-10-CM | POA: Diagnosis present

## 2023-11-17 MED ORDER — MILK AND MOLASSES ENEMA
1.0000 | Freq: Once | RECTAL | Status: AC
  Administered 2023-11-17: 240 mL via RECTAL
  Filled 2023-11-17: qty 240

## 2023-11-17 NOTE — ED Notes (Signed)
 Pt had very large mushy brown BM. Pericare provided. Tolerated well.

## 2023-11-17 NOTE — ED Provider Notes (Signed)
 [facility] Provider Note    Event Date/Time   First MD Initiated Contact with Patient 11/17/23 [phone]     (approximate)   History   Constipation   HPI  Victoria Medina is a 63 y.o. female who presents to the emergency department today because of concerns for constipation.  Patient states that this has been a chronic issue for her for a long time.  She does take a laxatives which will have waxing and waning efficacy.  She does not feel like she is constipated today.  Did have a small bowel movement earlier in the day but continues to feel like there is a large amount of stool in her rectal vault.  She denies any abdominal pain.  Denies any nausea or vomiting or fevers.     Physical Exam   Triage Vital Signs: ED Triage Vitals  Encounter Vitals Group     BP 11/17/23 0139 121/77     Systolic BP Percentile --      Diastolic BP Percentile --      Pulse Rate 11/17/23 0139 85     Resp 11/17/23 0139 18     Temp 11/17/23 0139 97.6 F (36.4 C)     Temp Source 11/17/23 0139 Oral     SpO2 11/17/23 0139 98 %     Weight 11/17/23 0140 192 lb 3.9 oz (87.2 kg)     Height 11/17/23 0140 5\' 1"  (1.549 m)     Head Circumference --      Peak Flow --      Pain Score --      Pain Loc --      Pain Education --      Exclude from Growth Chart --     Most recent vital signs: Vitals:   11/17/23 0139  BP: 121/77  Pulse: 85  Resp: 18  Temp: 97.6 F (36.4 C)  SpO2: 98%   General: Awake, alert, oriented. CV:  Good peripheral perfusion. Regular rate and rhythm. Resp:  Normal effort. Lungs clear. Abd:  No distention. Non tender. Other:  Rectal exam without obvious fecal impaction, however limited secondary to patient discomfort.    ED Results / Procedures / Treatments   Labs (all labs ordered are listed, but only abnormal results are displayed) Labs Reviewed - No data to display   EKG  None   RADIOLOGY None   PROCEDURES:  Critical Care performed:  No  MEDICATIONS ORDERED IN ED: Medications - No data to display   IMPRESSION / MDM / ASSESSMENT AND PLAN / ED COURSE  I reviewed the triage vital signs and the nursing notes.                              Differential diagnosis includes, but is not limited to, fecal impaction, constipation  Patient's presentation is most consistent with acute presentation with potential threat to life or bodily function.   Patient presented to the emergency department today because of concerns for constipation.  On exam patient without any abdominal tenderness.  Did attempt digital rectal exam however secondary to patient's discomfort it was limited.  No fecal impaction however was noted.  Patient was given an enema and did have a subsequently good output.  She did feel improved.  At this time will plan on discharging.   FINAL CLINICAL IMPRESSION(S) / ED DIAGNOSES   Final diagnoses:  Constipation, unspecified constipation type  Note:  This document was prepared using Dragon voice recognition software and may include unintentional dictation errors.    Marylynn Soho, MD 11/17/23 [phone]

## 2023-11-17 NOTE — ED Notes (Signed)
Placed pt on bedpan for BM.

## 2023-11-17 NOTE — ED Triage Notes (Signed)
 Patient C/O feeling constipated. Per staff at [facility], patient had large bowel movement around 1900 last evening, and has been having regular bowel movements. Patient states she had a small bowel movement yesterday afternoon.

## 2023-11-17 NOTE — ED Notes (Signed)
 Waiting on [facility] to transport patient back to [facility] around 10am

## 2023-11-17 NOTE — ED Notes (Signed)
 Pt taken off bedpan by EDTs. Pt cleaned. New linens and brief in place. Pt reports with movement started to have centralized CP.

## 2023-11-19 ENCOUNTER — Ambulatory Visit: Admitting: Physician Assistant

## 2023-11-19 ENCOUNTER — Non-Acute Institutional Stay (SKILLED_NURSING_FACILITY): Payer: Self-pay | Admitting: Student

## 2023-11-19 DIAGNOSIS — Z09 Encounter for follow-up examination after completed treatment for conditions other than malignant neoplasm: Secondary | ICD-10-CM | POA: Diagnosis not present

## 2023-11-19 DIAGNOSIS — K5901 Slow transit constipation: Secondary | ICD-10-CM

## 2023-11-20 ENCOUNTER — Encounter: Payer: Self-pay | Admitting: Student

## 2023-11-20 NOTE — Progress Notes (Signed)
 Location:  Other Nursing Home Room Number: [facility] [address] Place of Service:  SNF [phone]) Provider:  Alver Jobs, Lisa Rideau, MD  Patient Care Team: Valrie Gehrig, MD as PCP - General (Family Medicine) Wenona Hamilton, MD as PCP - Cardiology (Cardiology)  Extended Emergency Contact Information Primary Emergency Contact: Worst (POA),[name]  [place] Home Phone: [phone] Mobile Phone: [phone] Relation: Sister Secondary Emergency Contact: Glenys Lapine Address: [address]          [place], [place] [address] [place] Home Phone: [phone] Relation: Mother  Code Status:  DNR Goals of care: Advanced Directive information    11/11/2023    1:26 PM  Advanced Directives  Does Patient Have a Medical Advance Directive? Yes  Type of Advance Directive Out of facility DNR (pink MOST or yellow form)     Chief Complaint  Patient presents with   Follow-up    HPI:  Pt is a 63 y.o. female seen today for an acute visit for ED f/u.  Patient states she has had more and more trouble having bowel movements. When asked when her last bowel movement was, she states, I don't know. Per nursing, patient has had frequent stools over the last 24 hours. Some liquidy stools today after visiting the emergency department. She received an enema in the hospital.     Past Medical History:  Diagnosis Date   Acute ST elevation myocardial infarction (STEMI) of inferolateral wall (HCC) 01/25/2020   Blind    Cholecystitis 04/22/2021   History of migraine headaches    Hyperlipemia    Myocardial infarction [facility])    Stroke [facility])    Past Surgical History:  Procedure Laterality Date   ABDOMINAL HYSTERECTOMY     BACK SURGERY     CARDIAC CATHETERIZATION     CORONARY/GRAFT ACUTE MI REVASCULARIZATION N/A 01/25/2020   Procedure: Coronary/Graft Acute MI Revascularization;  Surgeon: Wenona Hamilton, MD;  Location: [facility] INVASIVE CV LAB;  Service:  Cardiovascular;  Laterality: N/A;   FRACTURE SURGERY     IR CATHETER TUBE CHANGE  08/10/2021   LEFT HEART CATH AND CORONARY ANGIOGRAPHY N/A 01/25/2020   Procedure: LEFT HEART CATH AND CORONARY ANGIOGRAPHY;  Surgeon: Wenona Hamilton, MD;  Location: [facility] INVASIVE CV LAB;  Service: Cardiovascular;  Laterality: N/A;   SKIN GRAFT Right    TOTAL ABDOMINAL HYSTERECTOMY Bilateral     Allergies  Allergen Reactions   Levaquin [Levofloxacin In D5w] Anaphylaxis and Swelling   Iodinated Contrast Media Itching    Severe itching   Other     Dust mites and opiates (all of them)   Latex Dermatitis    Outpatient Encounter Medications as of 11/19/2023  Medication Sig   alum & mag hydroxide-simeth (MAALOX PLUS) 400-400-40 MG/5ML suspension Take 15 mLs by mouth every 4 (four) hours as needed for indigestion.   aspirin  EC 81 MG tablet Take 81 mg by mouth daily.   Azelastine  HCl 0.15 % SOLN Place 2 sprays into both nostrils 2 (two) times daily.   Cholecalciferol (VITAMIN D3) 1.25 MG (50000 UT) CAPS Take 1 capsule by mouth once a week.  every Mon for Supplement   diazepam  (VALIUM ) 10 MG tablet Take 1 tablet (10 mg total) by mouth 4 (four) times daily.   ferrous sulfate 325 (65 FE) MG EC tablet Take 325 mg by mouth. Every Monday, Wednesday and Friday.   ibuprofen  (ADVIL ) 800 MG tablet Take 800 mg by mouth every 8 (eight) hours as  needed for mild pain or moderate pain.   isosorbide mononitrate (IMDUR) 60 MG 24 hr tablet Take 60 mg by mouth daily.   lansoprazole (PREVACID) 15 MG capsule Take 15 mg by mouth 2 (two) times daily before a meal.   levothyroxine  (SYNTHROID ) 25 MCG tablet Take 25 mcg by mouth daily before breakfast.   losartan  (COZAAR ) 100 MG tablet Take 100 mg by mouth daily.   magnesium  hydroxide (MILK OF MAGNESIA) 400 MG/5ML suspension Take 5 mLs by mouth every 6 (six) hours as needed for mild constipation.   nitroGLYCERIN  (NITROSTAT ) 0.4 MG SL tablet Place 0.4 mg under the tongue every 5 (five)  minutes x 3 doses as needed for chest pain.    OLANZapine  (ZYPREXA ) 5 MG tablet Take 5 mg by mouth daily.   ondansetron  (ZOFRAN -ODT) 4 MG disintegrating tablet Take 4 mg by mouth every 4 (four) hours as needed for nausea or vomiting.   polyethylene glycol (MIRALAX  / GLYCOLAX ) 17 g packet Take 17 g by mouth daily.   promethazine  (PHENERGAN ) 12.5 MG suppository Place 1 suppository (12.5 mg total) rectally every 6 (six) hours as needed for nausea or vomiting.   ranolazine (RANEXA) 500 MG 12 hr tablet Take 500 mg by mouth 2 (two) times daily.   rosuvastatin (CRESTOR) 5 MG tablet Take 5 mg by mouth daily. Every Tuesday,Thursday,Sat   senna-docusate (SENOKOT-S) 8.6-50 MG tablet Take 2 tablets by mouth at bedtime.   sertraline (ZOLOFT) 100 MG tablet Take 50 mg by mouth daily.   Vibegron  (GEMTESA ) 75 MG TABS Take 75 mg by mouth daily.   No facility-administered encounter medications on file as of 11/19/2023.    Review of Systems  Immunization History  Administered Date(s) Administered   Influenza-Unspecified 04/28/2011, 06/14/2012   Moderna Sars-Covid-2 Vaccination 09/08/2019, 10/06/2019, 06/11/2020   Tdap 09/03/2013, 11/15/2016   Zoster Recombinant(Shingrix) 12/08/2022, 03/10/2023   Pertinent  Health Maintenance Due  Topic Date Due   Colonoscopy  10/10/2024 (Originally 07/09/2006)   OPHTHALMOLOGY EXAM  10/22/2024 (Originally [birth year]10/1971)   HEMOGLOBIN A1C  12/02/2023   FOOT EXAM  04/16/2024   MAMMOGRAM  11/29/2024   INFLUENZA VACCINE  Discontinued      11/26/2021    2:10 AM 02/09/2022    1:51 AM 03/01/2022    7:43 AM 05/28/2022    2:13 AM 12/07/2022    2:19 PM  Fall Risk  Falls in the past year?     Exclusion - non ambulatory  (RETIRED) Patient Fall Risk Level High fall risk Moderate fall risk Moderate fall risk Moderate fall risk    Functional Status Survey:    Vitals:   11/19/23 2110  BP: 129/80  Pulse: 80  Resp: 17  Temp: 97.6 F (36.4 C)  SpO2: 96%  Weight: 188 lb 12.8 oz  (85.6 kg)   Body mass index is 35.67 kg/m. Physical Exam Constitutional:      Comments: Sitting in wheelchair  Cardiovascular:     Rate and Rhythm: Normal rate.     Pulses: Normal pulses.  Pulmonary:     Effort: Pulmonary effort is normal.  Abdominal:     General: Abdomen is flat.     Palpations: Abdomen is soft.  Skin:    General: Skin is warm.  Neurological:     General: No focal deficit present.     Mental Status: She is alert.     Labs reviewed: Recent Labs    01/24/23 0307 03/01/23 0000 10/04/23 0832 11/05/23 0000 11/08/23 0000 11/11/23 1336  NA  132*   < > 135 126*  --  130*  K 3.4*   < > 4.1 4.3 4.4 4.1  CL 98   < > 97* 90* 93* 94*  CO2 24   < > 26 27* 24* 24  GLUCOSE 178*  --  152*  --   --  155*  BUN 8   < > <5* 4 4 <5*  CREATININE 0.72   < > 0.71 0.7 0.8 0.73  CALCIUM  8.2*   < > 9.6 8.3* 8.7 9.0   < > = values in this interval not displayed.   Recent Labs    06/04/23 0000 11/08/23 0000 11/11/23 1336  AST 10* 25 16  ALT 7 10 12   ALKPHOS 91 94 98  BILITOT  --   --  0.7  PROT  --   --  7.4  ALBUMIN 3.8 3.9 4.0   Recent Labs    01/24/23 0307 03/01/23 0000 06/04/23 0000 09/03/23 0000 10/04/23 0832 11/05/23 0000 11/08/23 0000 11/11/23 1336  WBC 9.0   < > 10.8 10.6 10.3 9.3 9.3 13.4*  NEUTROABS  --    < > 8,143.00 7,929.00  --   --  6,222.00  --   HGB 9.3*   < > 10.1* 9.3* 11.1* 9.4* 10.7* 10.2*  HCT 34.4*  --  33* 31* 36.6 31* 35* 31.8*  MCV 76.8*  --   --   --  71.9*  --   --  68.7*  PLT 283   < >  --  393 435* 405* 489* 572*   < > = values in this interval not displayed.   Lab Results  Component Value Date   TSH 2.44 06/04/2023   Lab Results  Component Value Date   HGBA1C 6.5 06/04/2023   Lab Results  Component Value Date   CHOL 218 (A) 11/29/2022   HDL 64 11/29/2022   LDLCALC 135 11/29/2022   TRIG 89 11/29/2022   CHOLHDL 5.3 01/26/2020    Significant Diagnostic Results in last 30 days:  No results  found.  Assessment/Plan Constipation by delayed colonic transit  Hospital discharge follow-up Patient seen today for follow up of ED visit. Received treatment for fecal impaction. KUB ordered which showed no acute abnormalities, however on my review significant bowel gas and low stool volume. Continue senna nightly and miralax  and hold for diarrhea. Encourage high fiber diet.   Family/ staff Communication: nursing  Labs/tests ordered:  none

## 2023-11-29 LAB — HEMOGLOBIN A1C: Hemoglobin A1C: 6.4

## 2023-11-29 LAB — COMPREHENSIVE METABOLIC PANEL WITH GFR
Calcium: 9.1 (ref 8.7–10.7)
eGFR: 77

## 2023-11-29 LAB — BASIC METABOLIC PANEL WITH GFR
BUN: 5 (ref 4–21)
CO2: 27 — AB (ref 13–22)
Chloride: 92 — AB (ref 99–108)
Creatinine: 0.9 (ref 0.5–1.1)
Glucose: 103
Potassium: 4.2 meq/L (ref 3.5–5.1)
Sodium: 130 — AB (ref 137–147)

## 2023-11-29 LAB — CBC: RBC: 5.33 — AB (ref 3.87–5.11)

## 2023-11-29 LAB — CBC AND DIFFERENTIAL
HCT: 39 (ref 36–46)
Hemoglobin: 12 (ref 12.0–16.0)
Neutrophils Absolute: 7554
Platelets: 461 10*3/uL — AB (ref 150–400)
WBC: 9.9

## 2023-11-29 LAB — LIPID PANEL
Cholesterol: 207 — AB (ref 0–200)
HDL: 59 (ref 35–70)
LDL Cholesterol: 117
Triglycerides: 185 — AB (ref 40–160)

## 2023-12-11 ENCOUNTER — Ambulatory Visit (INDEPENDENT_AMBULATORY_CARE_PROVIDER_SITE_OTHER): Admitting: Physician Assistant

## 2023-12-11 DIAGNOSIS — Z435 Encounter for attention to cystostomy: Secondary | ICD-10-CM

## 2023-12-11 MED ORDER — CEFDINIR 300 MG PO CAPS: 300.0000 mg | ORAL_CAPSULE | Freq: Two times a day (BID) | ORAL | 0 refills | Status: AC

## 2023-12-11 NOTE — Progress Notes (Signed)
 Suprapubic Cath Change   Patient is present today for a suprapubic catheter change due to urinary retention.  8ml of water was drained from the balloon, a 18FR silicone foley cath was removed from the tract with some difficulty due to a ledge on the deflated balloon.  Site was cleaned and prepped in a sterile fashion with betadine.  A 18FR Silastic foley cath was replaced into the tract no complications were noted. Urine return was noted, 10 ml of sterile water was inflated into the balloon and a night bag was attached for drainage.  Patient tolerated well.    Performed by: Tasheem Elms, PA-C, Matilde Son, PA-C, and Theodosia Fishman, CMA  Additional notes: Due to required tract manipulation today, will start 5 days of empiric Omnicef  for UTI prevention.   Follow up: Return in about 4 weeks (around 11/13/2023) for SPT exchange.

## 2023-12-20 ENCOUNTER — Non-Acute Institutional Stay (SKILLED_NURSING_FACILITY): Payer: Self-pay | Admitting: Adult Health

## 2023-12-20 ENCOUNTER — Encounter: Payer: Self-pay | Admitting: Adult Health

## 2023-12-20 DIAGNOSIS — I10 Essential (primary) hypertension: Secondary | ICD-10-CM | POA: Diagnosis not present

## 2023-12-20 DIAGNOSIS — I25118 Atherosclerotic heart disease of native coronary artery with other forms of angina pectoris: Secondary | ICD-10-CM | POA: Diagnosis not present

## 2023-12-20 NOTE — Progress Notes (Signed)
 Location:  Other [facility]) Nursing Home Room Number: [facility] [address] [facility]) Place of Service:  SNF (31) Provider:  Inge Mangle, DNP, FNP-BC  Patient Care Team: Valrie Gehrig, MD as PCP - General (Family Medicine) Wenona Hamilton, MD as PCP - Cardiology (Cardiology)  Extended Emergency Contact Information Primary Emergency Contact: [facility]  [place] Home Phone: [phone] Mobile Phone: [phone] Relation: Sister Secondary Emergency Contact: Glenys Lapine Address: [address]          [place], [place] [address] [place] Home Phone: [phone] Relation: Mother  Code Status:  DNR  Goals of care: Advanced Directive information    12/20/2023    2:08 PM  Advanced Directives  Does Patient Have a Medical Advance Directive? Yes  Type of Advance Directive Out of facility DNR (pink MOST or yellow form)  Does patient want to make changes to medical advance directive? No - Patient declined  Pre-existing out of facility DNR order (yellow form or pink MOST form) Yellow form placed in chart (order not valid for inpatient use)     Chief Complaint  Patient presents with   Hypertension    elevated BP when standing up.    HPI:  Pt is a 63 y.o. female seen today for acute visit for elevated BP. She is a resident of [address] [place] ([place]). She is bilaterally blind. She was reported to have elevated BP when she stands up/sitts down -  167/102, 169/108, 178/78. BP when lying down 118/75. She currently takes Isosorbide MN ER 60 mg daily and Losartan  100 mg daily for hypertension. SBPs ranging 118 to 183.  She takes Ranolazine ER 500 mg BID, Crestor 5 mg at bedtime and NTG PRN for CAD. No recent chest pains.   Past Medical History:  Diagnosis Date   Acute ST elevation myocardial infarction (STEMI) of inferolateral wall (HCC) 01/25/2020   Blind    Cholecystitis 04/22/2021   History of migraine headaches     Hyperlipemia    Myocardial infarction [facility])    Stroke [facility])    Past Surgical History:  Procedure Laterality Date   ABDOMINAL HYSTERECTOMY     BACK SURGERY     CARDIAC CATHETERIZATION     CORONARY/GRAFT ACUTE MI REVASCULARIZATION N/A 01/25/2020   Procedure: Coronary/Graft Acute MI Revascularization;  Surgeon: Wenona Hamilton, MD;  Location: [facility] INVASIVE CV LAB;  Service: Cardiovascular;  Laterality: N/A;   FRACTURE SURGERY     IR CATHETER TUBE CHANGE  08/10/2021   LEFT HEART CATH AND CORONARY ANGIOGRAPHY N/A 01/25/2020   Procedure: LEFT HEART CATH AND CORONARY ANGIOGRAPHY;  Surgeon: Wenona Hamilton, MD;  Location: [facility] INVASIVE CV LAB;  Service: Cardiovascular;  Laterality: N/A;   SKIN GRAFT Right    TOTAL ABDOMINAL HYSTERECTOMY Bilateral     Allergies  Allergen Reactions   Levaquin [Levofloxacin In D5w] Anaphylaxis and Swelling   Iodinated Contrast Media Itching    Severe itching   Other     Dust mites and opiates (all of them)   Latex Dermatitis    Outpatient Encounter Medications as of 12/20/2023  Medication Sig   aspirin  EC 81 MG tablet Take 81 mg by mouth daily.   Azelastine  HCl 0.15 % SOLN Place 2 sprays into both nostrils 2 (two) times daily.   Cholecalciferol (VITAMIN D3) 1.25 MG (50000 UT) CAPS Take 1 capsule by mouth once a week.  every Mon for Supplement   Dextromethorphan-Benzocaine (COUGH/SORE THROAT LOZENGES MT)  Use as directed 1 drop in the mouth or throat every 4 (four) hours as needed.   diazepam  (VALIUM ) 10 MG tablet Take 1 tablet (10 mg total) by mouth 4 (four) times daily.   ferrous sulfate 325 (65 FE) MG EC tablet Take 325 mg by mouth. Every Monday, Wednesday and Friday.   ibuprofen  (ADVIL ) 800 MG tablet Take 800 mg by mouth every 8 (eight) hours as needed for mild pain or moderate pain.   isosorbide mononitrate (IMDUR) 60 MG 24 hr tablet Take 60 mg by mouth daily.   lansoprazole (PREVACID) 15 MG capsule Take 15 mg by mouth 2 (two) times daily before  a meal.   levothyroxine  (SYNTHROID ) 25 MCG tablet Take 25 mcg by mouth daily before breakfast.   losartan  (COZAAR ) 100 MG tablet Take 100 mg by mouth daily.   magnesium  hydroxide (MILK OF MAGNESIA) 400 MG/5ML suspension Take 5 mLs by mouth every 6 (six) hours as needed for mild constipation.   nitroGLYCERIN  (NITROSTAT ) 0.4 MG SL tablet Place 0.4 mg under the tongue every 5 (five) minutes x 3 doses as needed for chest pain.    OLANZapine  (ZYPREXA ) 5 MG tablet Take 5 mg by mouth daily.   ondansetron  (ZOFRAN -ODT) 4 MG disintegrating tablet Take 4 mg by mouth every 4 (four) hours as needed for nausea or vomiting.   polyethylene glycol (MIRALAX  / GLYCOLAX ) 17 g packet Take 17 g by mouth daily.   promethazine  (PHENERGAN ) 12.5 MG suppository Place 1 suppository (12.5 mg total) rectally every 6 (six) hours as needed for nausea or vomiting.   ranolazine (RANEXA) 500 MG 12 hr tablet Take 500 mg by mouth 2 (two) times daily.   rosuvastatin (CRESTOR) 5 MG tablet Take 5 mg by mouth daily. Every Tuesday,Thursday,Sat   senna-docusate (SENOKOT-S) 8.6-50 MG tablet Take 2 tablets by mouth at bedtime.   sertraline (ZOLOFT) 100 MG tablet Take 50 mg by mouth daily.   Vibegron  (GEMTESA ) 75 MG TABS Take 75 mg by mouth daily.   alum & mag hydroxide-simeth (MAALOX PLUS) 400-400-40 MG/5ML suspension Take 15 mLs by mouth every 4 (four) hours as needed for indigestion. (Patient not taking: Reported on 12/20/2023)   No facility-administered encounter medications on file as of 12/20/2023.    Review of Systems  Constitutional:  Negative for appetite change, chills, fatigue and fever.  HENT:  Negative for congestion, hearing loss, rhinorrhea and sore throat.   Eyes: Negative.   Respiratory:  Negative for cough, shortness of breath and wheezing.   Cardiovascular:  Negative for chest pain, palpitations and leg swelling.  Gastrointestinal:  Negative for abdominal pain, constipation, diarrhea, nausea and vomiting.   Genitourinary:  Negative for dysuria.  Musculoskeletal:  Negative for arthralgias, back pain and myalgias.  Skin:  Negative for color change, rash and wound.  Neurological:  Negative for dizziness, weakness and headaches.  Psychiatric/Behavioral:  Negative for behavioral problems. The patient is not nervous/anxious.       Immunization History  Administered Date(s) Administered   Influenza-Unspecified 04/28/2011, 06/14/2012   Moderna Sars-Covid-2 Vaccination 09/08/2019, 10/06/2019, 06/11/2020   Tdap 09/03/2013, 11/15/2016   Zoster Recombinant(Shingrix) 12/08/2022, 03/10/2023   Pertinent  Health Maintenance Due  Topic Date Due   HEMOGLOBIN A1C  12/02/2023   Colonoscopy  10/10/2024 (Originally 07/09/2006)   OPHTHALMOLOGY EXAM  10/22/2024 (Originally 07/10/1971)   FOOT EXAM  04/16/2024   MAMMOGRAM  11/29/2024   INFLUENZA VACCINE  Discontinued      11/26/2021    2:10 AM 02/09/2022  1:51 AM 03/01/2022    7:43 AM 05/28/2022    2:13 AM 12/07/2022    2:19 PM  Fall Risk  Falls in the past year?     Exclusion - non ambulatory  (RETIRED) Patient Fall Risk Level High fall risk Moderate fall risk Moderate fall risk Moderate fall risk      Vitals:   12/20/23 1356  BP: 118/75  Pulse: 84  Resp: 17  Temp: (!) 96.8 F (36 C)  SpO2: 96%  Weight: 180 lb 6.4 oz (81.8 kg)  Height: 5\' 1"  (1.549 m)   Body mass index is 34.09 kg/m.  Physical Exam Constitutional:      General: She is not in acute distress.    Appearance: She is obese.  HENT:     Head: Normocephalic and atraumatic.     Nose: Nose normal.     Mouth/Throat:     Mouth: Mucous membranes are moist.  Eyes:     Conjunctiva/sclera: Conjunctivae normal.     Comments: Bilaterally blind  Cardiovascular:     Rate and Rhythm: Normal rate and regular rhythm.  Pulmonary:     Effort: Pulmonary effort is normal.     Breath sounds: Normal breath sounds.  Abdominal:     General: Bowel sounds are normal.     Palpations: Abdomen  is soft.  Genitourinary:    Comments: Has suprapubic catheter Musculoskeletal:     Cervical back: Normal range of motion.     Comments: Limited ROM on left elbow and right hand contracted  Skin:    General: Skin is warm and dry.  Neurological:     Mental Status: She is alert.  Psychiatric:        Mood and Affect: Mood normal.        Behavior: Behavior normal.      Labs reviewed: Recent Labs    01/24/23 0307 03/01/23 0000 10/04/23 0832 11/05/23 0000 11/08/23 0000 11/11/23 1336  NA 132*   < > 135 126*  --  130*  K 3.4*   < > 4.1 4.3 4.4 4.1  CL 98   < > 97* 90* 93* 94*  CO2 24   < > 26 27* 24* 24  GLUCOSE 178*  --  152*  --   --  155*  BUN 8   < > <5* 4 4 <5*  CREATININE 0.72   < > 0.71 0.7 0.8 0.73  CALCIUM  8.2*   < > 9.6 8.3* 8.7 9.0   < > = values in this interval not displayed.   Recent Labs    06/04/23 0000 11/08/23 0000 11/11/23 1336  AST 10* 25 16  ALT 7 10 12   ALKPHOS 91 94 98  BILITOT  --   --  0.7  PROT  --   --  7.4  ALBUMIN 3.8 3.9 4.0   Recent Labs    01/24/23 0307 03/01/23 0000 06/04/23 0000 09/03/23 0000 10/04/23 0832 11/05/23 0000 11/08/23 0000 11/11/23 1336  WBC 9.0   < > 10.8 10.6 10.3 9.3 9.3 13.4*  NEUTROABS  --    < > 8,143.00 7,929.00  --   --  6,222.00  --   HGB 9.3*   < > 10.1* 9.3* 11.1* 9.4* 10.7* 10.2*  HCT 34.4*  --  33* 31* 36.6 31* 35* 31.8*  MCV 76.8*  --   --   --  71.9*  --   --  68.7*  PLT 283   < >  --  393 435* 405* 489* 572*   < > = values in this interval not displayed.   Lab Results  Component Value Date   TSH 2.44 06/04/2023   Lab Results  Component Value Date   HGBA1C 6.5 06/04/2023   Lab Results  Component Value Date   CHOL 218 (A) 11/29/2022   HDL 64 11/29/2022   LDLCALC 135 11/29/2022   TRIG 89 11/29/2022   CHOLHDL 5.3 01/26/2020    Significant Diagnostic Results in last 30 days:  No results found.  Assessment/Plan  1. Essential hypertension (Primary) -  BP elevated when  sitting/standing -  will start Amlodipine 2.5 mg daily -  continue Isosorbide MN ER 60 mg daily and Losartan  100 mg daily -  monitor BP  2. Coronary artery disease of native artery of native heart with stable angina pectoris (HCC) -  denies chest pain -  continue Ranolazine ER 500 mg BID, NTG PRN and Crestor 5 mg at bedtime     Family/ staff Communication: Discussed plan of care with resident and charge nurse  Labs/tests ordered: None    Dartanyan Deasis Medina-Vargas, DNP, MSN, FNP-BC [facility] [phone] (Monday-Friday 8:00 a.m. - 5:00 p.m.) [phone] (after hours)

## 2023-12-24 ENCOUNTER — Other Ambulatory Visit: Payer: Self-pay | Admitting: Student

## 2023-12-24 DIAGNOSIS — Z79899 Other long term (current) drug therapy: Secondary | ICD-10-CM

## 2023-12-25 LAB — COMPREHENSIVE METABOLIC PANEL WITH GFR
Calcium: 8.8 (ref 8.7–10.7)
eGFR: 91

## 2023-12-25 LAB — BASIC METABOLIC PANEL WITH GFR
BUN: 3 — AB (ref 4–21)
CO2: 26 — AB (ref 13–22)
Chloride: 96 — AB (ref 99–108)
Creatinine: 0.7 (ref 0.5–1.1)
Glucose: 103
Potassium: 4.3 meq/L (ref 3.5–5.1)
Sodium: 132 — AB (ref 137–147)

## 2023-12-25 NOTE — Telephone Encounter (Signed)
 This is an SNF resident.

## 2023-12-27 ENCOUNTER — Non-Acute Institutional Stay (SKILLED_NURSING_FACILITY): Payer: Self-pay | Admitting: Nurse Practitioner

## 2023-12-27 DIAGNOSIS — Z0000 Encounter for general adult medical examination without abnormal findings: Secondary | ICD-10-CM | POA: Diagnosis not present

## 2023-12-27 NOTE — Progress Notes (Signed)
 Subjective:   Victoria Medina is a 63 y.o. female who presents for Medicare Annual (Subsequent) preventive examination.  Visit Complete: In person at [facility]     Cardiac Risk Factors include: advanced age (>60men, >34 women);hypertension;dyslipidemia;family history of premature cardiovascular disease;sedentary lifestyle     Objective:     There were no vitals filed for this visit. There is no height or weight on file to calculate BMI.     12/20/2023    2:08 PM 11/11/2023    1:26 PM 11/02/2023   10:31 AM 10/04/2023    8:23 AM 07/19/2023    9:12 AM 05/18/2023   10:03 AM 04/17/2023    3:10 PM  Advanced Directives  Does Patient Have a Medical Advance Directive? Yes Yes Yes Yes Yes Yes Yes  Type of Advance Directive Out of facility DNR (pink MOST or yellow form) Out of facility DNR (pink MOST or yellow form) Out of facility DNR (pink MOST or yellow form) Out of facility DNR (pink MOST or yellow form) Out of facility DNR (pink MOST or yellow form) Out of facility DNR (pink MOST or yellow form) Out of facility DNR (pink MOST or yellow form)  Does patient want to make changes to medical advance directive? No - Patient declined  No - Patient declined  No - Patient declined No - Patient declined No - Patient declined  Pre-existing out of facility DNR order (yellow form or pink MOST form) Yellow form placed in chart (order not valid for inpatient use)   Physician notified to receive inpatient order       Current Medications (verified) Outpatient Encounter Medications as of 12/27/2023  Medication Sig   alum & mag hydroxide-simeth (MAALOX PLUS) 400-400-40 MG/5ML suspension Take 15 mLs by mouth every 4 (four) hours as needed for indigestion. (Patient not taking: Reported on 12/20/2023)   aspirin  EC 81 MG tablet Take 81 mg by mouth daily.   Azelastine  HCl 0.15 % SOLN Place 2 sprays into both nostrils 2 (two) times daily.   Cholecalciferol (VITAMIN D3) 1.25 MG (50000 UT) CAPS Take 1 capsule by  mouth once a week.  every Mon for Supplement   Dextromethorphan-Benzocaine (COUGH/SORE THROAT LOZENGES MT) Use as directed 1 drop in the mouth or throat every 4 (four) hours as needed.   diazepam  (VALIUM ) 10 MG tablet TAKE 1 TABLET BY MOUTH FOUR TIMES A DAY   ferrous sulfate 325 (65 FE) MG EC tablet Take 325 mg by mouth. Every Monday, Wednesday and Friday.   ibuprofen  (ADVIL ) 800 MG tablet Take 800 mg by mouth every 8 (eight) hours as needed for mild pain or moderate pain.   isosorbide mononitrate (IMDUR) 60 MG 24 hr tablet Take 60 mg by mouth daily.   lansoprazole (PREVACID) 15 MG capsule Take 15 mg by mouth 2 (two) times daily before a meal.   levothyroxine  (SYNTHROID ) 25 MCG tablet Take 25 mcg by mouth daily before breakfast.   losartan  (COZAAR ) 100 MG tablet Take 100 mg by mouth daily.   magnesium  hydroxide (MILK OF MAGNESIA) 400 MG/5ML suspension Take 5 mLs by mouth every 6 (six) hours as needed for mild constipation.   nitroGLYCERIN  (NITROSTAT ) 0.4 MG SL tablet Place 0.4 mg under the tongue every 5 (five) minutes x 3 doses as needed for chest pain.    OLANZapine  (ZYPREXA ) 5 MG tablet Take 5 mg by mouth daily.   ondansetron  (ZOFRAN -ODT) 4 MG disintegrating tablet Take 4 mg by mouth every 4 (four) hours as  needed for nausea or vomiting.   polyethylene glycol (MIRALAX  / GLYCOLAX ) 17 g packet Take 17 g by mouth daily.   promethazine  (PHENERGAN ) 12.5 MG suppository Place 1 suppository (12.5 mg total) rectally every 6 (six) hours as needed for nausea or vomiting.   ranolazine (RANEXA) 500 MG 12 hr tablet Take 500 mg by mouth 2 (two) times daily.   rosuvastatin (CRESTOR) 5 MG tablet Take 5 mg by mouth daily. Every Tuesday,Thursday,Sat   senna-docusate (SENOKOT-S) 8.6-50 MG tablet Take 2 tablets by mouth at bedtime.   sertraline (ZOLOFT) 100 MG tablet Take 50 mg by mouth daily.   Vibegron  (GEMTESA ) 75 MG TABS Take 75 mg by mouth daily.   No facility-administered encounter medications on file as of  12/27/2023.    Allergies (verified) Levaquin [levofloxacin in d5w], Iodinated contrast media, Other, and Latex   History: Past Medical History:  Diagnosis Date   Acute ST elevation myocardial infarction (STEMI) of inferolateral wall (HCC) 01/25/2020   Blind    Cholecystitis 04/22/2021   History of migraine headaches    Hyperlipemia    Myocardial infarction [facility])    Stroke [facility])    Past Surgical History:  Procedure Laterality Date   ABDOMINAL HYSTERECTOMY     BACK SURGERY     CARDIAC CATHETERIZATION     CORONARY/GRAFT ACUTE MI REVASCULARIZATION N/A 01/25/2020   Procedure: Coronary/Graft Acute MI Revascularization;  Surgeon: Wenona Hamilton, MD;  Location: [facility] INVASIVE CV LAB;  Service: Cardiovascular;  Laterality: N/A;   FRACTURE SURGERY     IR CATHETER TUBE CHANGE  08/10/2021   LEFT HEART CATH AND CORONARY ANGIOGRAPHY N/A 01/25/2020   Procedure: LEFT HEART CATH AND CORONARY ANGIOGRAPHY;  Surgeon: Wenona Hamilton, MD;  Location: [facility] INVASIVE CV LAB;  Service: Cardiovascular;  Laterality: N/A;   SKIN GRAFT Right    TOTAL ABDOMINAL HYSTERECTOMY Bilateral    Family History  Problem Relation Age of Onset   Heart attack Mother    Hypertension Mother    Hypercholesterolemia Mother    Diabetes Mother    Stroke Father    Heart attack Father    Hypertension Father    Hypercholesterolemia Father    Diabetes Father    Diabetes Maternal Grandmother    Cancer - Other Maternal Grandmother    Social History   Socioeconomic History   Marital status: Single    Spouse name: Not on file   Number of children: Not on file   Years of education: Not on file   Highest education level: Not on file  Occupational History   Not on file  Tobacco Use   Smoking status: Never   Smokeless tobacco: Never  Vaping Use   Vaping status: Never Used  Substance and Sexual Activity   Alcohol use: Yes    Comment: occasional   Drug use: No   Sexual activity: Not Currently  Other Topics Concern    Not on file  Social History Narrative   Not on file   Social Drivers of Health   Financial Resource Strain: Not on file  Food Insecurity: No Food Insecurity (09/14/2022)   Hunger Vital Sign    Worried About Running Out of Food in the Last Year: Never true    Ran Out of Food in the Last Year: Never true  Transportation Needs: No Transportation Needs (09/14/2022)   PRAPARE - Administrator, Civil Service (Medical): No    Lack of Transportation (Non-Medical): No  Physical Activity: Not on file  Stress: Not on file  Social Connections: Not on file    Tobacco Counseling Counseling given: Not Answered   Clinical Intake:  Pre-visit preparation completed: Yes  Pain : No/denies pain     BMI - recorded: 34 Nutritional Status: BMI > 30  Obese Nutritional Risks: None Diabetes: Yes  How often do you need to have someone help you when you read instructions, pamphlets, or other written materials from your doctor or pharmacy?: 5 - Always         Activities of Daily Living    12/27/2023   11:26 AM  In your present state of health, do you have any difficulty performing the following activities:  Hearing? 0  Vision? 1  Difficulty concentrating or making decisions? 0  Walking or climbing stairs? 1  Dressing or bathing? 1  Doing errands, shopping? 1  Preparing Food and eating ? Y  Using the Toilet? Y  In the past six months, have you accidently leaked urine? N  Do you have problems with loss of bowel control? N  Managing your Medications? Y  Managing your Finances? Y  Housekeeping or managing your Housekeeping? Y    Patient Care Team: Valrie Gehrig, MD as PCP - General (Family Medicine) Wenona Hamilton, MD as PCP - Cardiology (Cardiology)  Indicate any recent Medical Services you may have received from other than [facility] providers in the past year (date may be approximate).     Assessment:    This is a routine wellness examination for  Victoria Medina.  Hearing/Vision screen No results found.   Goals Addressed   None    Depression Screen    12/27/2023   11:28 AM  PHQ 2/9 Scores  PHQ - 2 Score 0    Fall Risk    12/27/2023   11:28 AM 12/07/2022    2:19 PM  Fall Risk   Falls in the past year? 0 Exclusion - non ambulatory    MEDICARE RISK AT HOME: Medicare Risk at Home Any stairs in or around the home?: No If so, are there any without handrails?: No Home free of loose throw rugs in walkways, pet beds, electrical cords, etc?: Yes Adequate lighting in your home to reduce risk of falls?: Yes Life alert?: No Use of a cane, walker or w/c?: Yes Grab bars in the bathroom?: Yes Shower chair or bench in shower?: Yes Elevated toilet seat or a handicapped toilet?: Yes  TIMED UP AND GO:  Was the test performed?  No    Cognitive Function:        12/27/2023   11:29 AM  6CIT Screen  What Year? 0 points  What month? 0 points  What time? 0 points  Count back from 20 4 points  Months in reverse 4 points  Repeat phrase 4 points  Total Score 12 points    Immunizations Immunization History  Administered Date(s) Administered   Influenza-Unspecified 04/28/2011, 06/14/2012   Moderna Sars-Covid-2 Vaccination 09/08/2019, 10/06/2019, 06/11/2020   Tdap 09/03/2013, 11/15/2016   Zoster Recombinant(Shingrix) 12/08/2022, 03/10/2023    TDAP status: Up to date  Flu Vaccine status: Up to date  Pneumococcal vaccine status: Declined,  Education has been provided regarding the importance of this vaccine but patient still declined. Advised may receive this vaccine at local pharmacy or Health Dept. Aware to provide a copy of the vaccination record if obtained from local pharmacy or Health Dept. Verbalized acceptance and understanding.   Covid-19 vaccine status: Declined, Education has been provided regarding the importance  of this vaccine but patient still declined. Advised may receive this vaccine at local pharmacy or Health Dept.or  vaccine clinic. Aware to provide a copy of the vaccination record if obtained from local pharmacy or Health Dept. Verbalized acceptance and understanding.  Qualifies for Shingles Vaccine? Yes   Zostavax completed No   Shingrix Completed?: Yes  Screening Tests Health Maintenance  Topic Date Due   HEMOGLOBIN A1C  12/02/2023   Pneumococcal Vaccine 20-54 Years old (1 of 2 - PCV) 10/10/2024 (Originally [birth year]09/1980)   Colonoscopy  10/10/2024 (Originally 07/09/2006)   OPHTHALMOLOGY EXAM  10/22/2024 (Originally 07/10/1971)   Diabetic kidney evaluation - Urine ACR  04/16/2024   FOOT EXAM  04/16/2024   Diabetic kidney evaluation - eGFR measurement  11/10/2024   MAMMOGRAM  11/29/2024   Medicare Annual Wellness (AWV)  12/26/2024   DTaP/Tdap/Td (3 - Td or Tdap) 11/16/2026   Hepatitis C Screening  Completed   HIV Screening  Completed   Zoster Vaccines- Shingrix  Completed   HPV VACCINES  Aged Out   Meningococcal B Vaccine  Aged Out   INFLUENZA VACCINE  Discontinued   COVID-19 Vaccine  Discontinued    Health Maintenance  Health Maintenance Due  Topic Date Due   HEMOGLOBIN A1C  12/02/2023    Declines colorectal screening  Mammogram status: Completed 11/30/2022. Repeat every year   Lung Cancer Screening: (Low Dose CT Chest recommended if Age 91-80 years, 20 pack-year currently smoking OR have quit w/in 15years.) does not qualify.   Lung Cancer Screening Referral: na  Additional Screening:  Hepatitis C Screening: does qualify; Completed   Vision Screening: Recommended annual ophthalmology exams for early detection of glaucoma and other disorders of the eye. Is the patient up to date with their annual eye exam?  No  she is legally blind  Dental Screening: Recommended annual dental exams for proper oral hygiene  Community Resource Referral / Chronic Care Management: CRR required this visit?  No   CCM required this visit?  No     Plan:     I have personally reviewed and noted  the following in the patient's chart:   Medical and social history Use of alcohol, tobacco or illicit drugs  Current medications and supplements including opioid prescriptions. Patient is not currently taking opioid prescriptions. Functional ability and status Nutritional status Physical activity Advanced directives List of other physicians Hospitalizations, surgeries, and ER visits in previous 12 months Vitals Screenings to include cognitive, depression, and falls Referrals and appointments  In addition, I have reviewed and discussed with patient certain preventive protocols, quality metrics, and best practice recommendations. A written personalized care plan for preventive services as well as general preventive health recommendations were provided to patient.     Verma Gobble, NP   12/27/2023

## 2023-12-31 ENCOUNTER — Ambulatory Visit: Attending: Cardiology | Admitting: Cardiology

## 2024-01-10 ENCOUNTER — Ambulatory Visit (INDEPENDENT_AMBULATORY_CARE_PROVIDER_SITE_OTHER): Admitting: Physician Assistant

## 2024-01-10 VITALS — BP 110/78 | HR 85 | Ht 64.0 in | Wt 180.0 lb

## 2024-01-10 DIAGNOSIS — Z435 Encounter for attention to cystostomy: Secondary | ICD-10-CM | POA: Diagnosis not present

## 2024-01-10 NOTE — Progress Notes (Signed)
 Suprapubic Cath Change  Patient is present today for a suprapubic catheter change due to urinary retention.  8ml of water was drained from the balloon, a 18FR Silastic foley cath was removed from the tract without difficulty.  Site was cleaned and prepped in a sterile fashion with betadine.  A 18FR Silastic foley cath was replaced into the tract no complications were noted. Urine return was noted, 10 ml of sterile water was inflated into the balloon and a night bag was attached for drainage.  Patient tolerated well.  Performed by: Rufino Staup, PA-C   Follow up: Return in about 4 weeks (around 02/07/2024) for SPT exchange.

## 2024-01-14 ENCOUNTER — Non-Acute Institutional Stay (SKILLED_NURSING_FACILITY): Payer: Self-pay | Admitting: Student

## 2024-01-14 DIAGNOSIS — E1169 Type 2 diabetes mellitus with other specified complication: Secondary | ICD-10-CM

## 2024-01-14 DIAGNOSIS — I10 Essential (primary) hypertension: Secondary | ICD-10-CM

## 2024-01-14 DIAGNOSIS — I5020 Unspecified systolic (congestive) heart failure: Secondary | ICD-10-CM

## 2024-01-14 DIAGNOSIS — I69359 Hemiplegia and hemiparesis following cerebral infarction affecting unspecified side: Secondary | ICD-10-CM

## 2024-01-14 DIAGNOSIS — E669 Obesity, unspecified: Secondary | ICD-10-CM

## 2024-01-14 DIAGNOSIS — Z66 Do not resuscitate: Secondary | ICD-10-CM

## 2024-01-14 NOTE — Progress Notes (Signed)
 Location:  Other Nursing Home Room Number: [facility] Place of Service:  SNF [phone]) Provider:  Abdul Abdul, Richerd, MD  Patient Care Team: Abdul Richerd, MD as PCP - General (Family Medicine) [name], MD as PCP - Cardiology (Cardiology)  Extended Emergency Contact Information Primary Emergency Contact: Schachter (POA),[name]  [place] Home Phone: [phone] Mobile Phone: [phone] Relation: Sister Secondary Emergency Contact: [name] Address: [address]          [place], [place] [address] [place] Home Phone: [phone] Relation: Mother  Code Status:  DNR Goals of care: Advanced Directive information    12/20/2023    2:08 PM  Advanced Directives  Does Patient Have a Medical Advance Directive? Yes  Type of Advance Directive Out of facility DNR (pink MOST or yellow form)  Does patient want to make changes to medical advance directive? No - Patient declined  Pre-existing out of facility DNR order (yellow form or pink MOST form) Yellow form placed in chart (order not valid for inpatient use)     Chief Complaint  Patient presents with   Medical Management of Chronic Issues    HPI:  Pt is a 63 y.o. female seen today for a routine  Discussed the use of AI scribe software for clinical note transcription with the patient, who gave verbal consent to proceed.  History of Present Illness  History of Present Illness They experienced chest pain last night, which improved after taking one dose of nitroglycerin . This morning, they had another episode of chest pain, also relieved with nitroglycerin . They are concerned about the severity of the chest pain. No current shortness of breath is reported.  They have a history of significant cardiovascular disease and coronary artery disease with numerous blockages. They are concerned about the frequency of blood pressure monitoring, noting it is not checked  regularly unless they are in the hospital.  They report ongoing issues with constipation, which have not improved since the last visit. They note that their chest pain often coincides with the need for a bowel movement or after having a large bowel movement.    Past Medical History:  Diagnosis Date   Acute ST elevation myocardial infarction (STEMI) of inferolateral wall (HCC) 01/25/2020   Blind    Cholecystitis 04/22/2021   History of migraine headaches    Hyperlipemia    Myocardial infarction [facility])    Stroke [facility])    Past Surgical History:  Procedure Laterality Date   ABDOMINAL HYSTERECTOMY     BACK SURGERY     CARDIAC CATHETERIZATION     CORONARY/GRAFT ACUTE MI REVASCULARIZATION N/A 01/25/2020   Procedure: Coronary/Graft Acute MI Revascularization;  Surgeon: [name], MD;  Location: [facility] INVASIVE CV LAB;  Service: Cardiovascular;  Laterality: N/A;   FRACTURE SURGERY     IR CATHETER TUBE CHANGE  08/10/2021   LEFT HEART CATH AND CORONARY ANGIOGRAPHY N/A 01/25/2020   Procedure: LEFT HEART CATH AND CORONARY ANGIOGRAPHY;  Surgeon: [name], MD;  Location: [facility] INVASIVE CV LAB;  Service: Cardiovascular;  Laterality: N/A;   SKIN GRAFT Right    TOTAL ABDOMINAL HYSTERECTOMY Bilateral     Allergies  Allergen Reactions   Levaquin [Levofloxacin In D5w] Anaphylaxis and Swelling   Iodinated Contrast Media Itching    Severe itching   Other     Dust mites and opiates (all of them)   Latex Dermatitis    Outpatient Encounter Medications as of 01/14/2024  Medication  Sig   alum & mag hydroxide-simeth (MAALOX PLUS) 400-400-40 MG/5ML suspension Take 15 mLs by mouth every 4 (four) hours as needed for indigestion. (Patient not taking: Reported on 12/20/2023)   aspirin  EC 81 MG tablet Take 81 mg by mouth daily.   Azelastine  HCl 0.15 % SOLN Place 2 sprays into both nostrils 2 (two) times daily.   Cholecalciferol (VITAMIN D3) 1.25 MG (50000 UT) CAPS Take 1 capsule by mouth once a  week.  every Mon for Supplement   Dextromethorphan-Benzocaine (COUGH/SORE THROAT LOZENGES MT) Use as directed 1 drop in the mouth or throat every 4 (four) hours as needed.   diazepam  (VALIUM ) 10 MG tablet TAKE 1 TABLET BY MOUTH FOUR TIMES A DAY   ferrous sulfate 325 (65 FE) MG EC tablet Take 325 mg by mouth. Every Monday, Wednesday and Friday.   ibuprofen  (ADVIL ) 800 MG tablet Take 800 mg by mouth every 8 (eight) hours as needed for mild pain or moderate pain.   isosorbide mononitrate (IMDUR) 60 MG 24 hr tablet Take 60 mg by mouth daily.   lansoprazole (PREVACID) 15 MG capsule Take 15 mg by mouth 2 (two) times daily before a meal.   levothyroxine  (SYNTHROID ) 25 MCG tablet Take 25 mcg by mouth daily before breakfast.   losartan  (COZAAR ) 100 MG tablet Take 100 mg by mouth daily.   magnesium  hydroxide (MILK OF MAGNESIA) 400 MG/5ML suspension Take 5 mLs by mouth every 6 (six) hours as needed for mild constipation.   nitroGLYCERIN  (NITROSTAT ) 0.4 MG SL tablet Place 0.4 mg under the tongue every 5 (five) minutes x 3 doses as needed for chest pain.    OLANZapine  (ZYPREXA ) 5 MG tablet Take 5 mg by mouth daily.   ondansetron  (ZOFRAN -ODT) 4 MG disintegrating tablet Take 4 mg by mouth every 4 (four) hours as needed for nausea or vomiting.   polyethylene glycol (MIRALAX  / GLYCOLAX ) 17 g packet Take 17 g by mouth daily.   promethazine  (PHENERGAN ) 12.5 MG suppository Place 1 suppository (12.5 mg total) rectally every 6 (six) hours as needed for nausea or vomiting.   ranolazine (RANEXA) 500 MG 12 hr tablet Take 500 mg by mouth 2 (two) times daily.   rosuvastatin (CRESTOR) 5 MG tablet Take 5 mg by mouth daily. Every Tuesday,Thursday,Sat   senna-docusate (SENOKOT-S) 8.6-50 MG tablet Take 2 tablets by mouth at bedtime.   sertraline (ZOLOFT) 100 MG tablet Take 50 mg by mouth daily.   Vibegron  (GEMTESA ) 75 MG TABS Take 75 mg by mouth daily.   No facility-administered encounter medications on file as of 01/14/2024.     Review of Systems  Immunization History  Administered Date(s) Administered   Influenza-Unspecified 04/28/2011, 06/14/2012   Moderna Sars-Covid-2 Vaccination 09/08/2019, 10/06/2019, 06/11/2020   Tdap 09/03/2013, 11/15/2016   Zoster Recombinant(Shingrix) 12/08/2022, 03/10/2023   Pertinent  Health Maintenance Due  Topic Date Due   HEMOGLOBIN A1C  12/02/2023   Colonoscopy  10/10/2024 (Originally 07/09/2006)   OPHTHALMOLOGY EXAM  10/22/2024 (Originally 07/10/1971)   FOOT EXAM  04/16/2024   MAMMOGRAM  11/29/2024   INFLUENZA VACCINE  Discontinued      02/09/2022    1:51 AM 03/01/2022    7:43 AM 05/28/2022    2:13 AM 12/07/2022    2:19 PM 12/27/2023   11:28 AM  Fall Risk  Falls in the past year?    Exclusion - non ambulatory 0  (RETIRED) Patient Fall Risk Level Moderate fall risk  Moderate fall risk  Moderate fall risk  Data saved with a previous flowsheet row definition   Functional Status Survey:    There were no vitals filed for this visit. There is no height or weight on file to calculate BMI. Physical Exam  Labs reviewed: Recent Labs    01/24/23 0307 03/01/23 0000 10/04/23 0832 11/05/23 0000 11/08/23 0000 11/11/23 1336  NA 132*   < > 135 126*  --  130*  K 3.4*   < > 4.1 4.3 4.4 4.1  CL 98   < > 97* 90* 93* 94*  CO2 24   < > 26 27* 24* 24  GLUCOSE 178*  --  152*  --   --  155*  BUN 8   < > <5* 4 4 <5*  CREATININE 0.72   < > 0.71 0.7 0.8 0.73  CALCIUM  8.2*   < > 9.6 8.3* 8.7 9.0   < > = values in this interval not displayed.   Recent Labs    06/04/23 0000 11/08/23 0000 11/11/23 1336  AST 10* 25 16  ALT 7 10 12   ALKPHOS 91 94 98  BILITOT  --   --  0.7  PROT  --   --  7.4  ALBUMIN 3.8 3.9 4.0   Recent Labs    01/24/23 0307 03/01/23 0000 06/04/23 0000 09/03/23 0000 10/04/23 0832 11/05/23 0000 11/08/23 0000 11/11/23 1336  WBC 9.0   < > 10.8 10.6 10.3 9.3 9.3 13.4*  NEUTROABS  --    < > 8,143.00 7,929.00  --   --  6,222.00  --   HGB 9.3*    < > 10.1* 9.3* 11.1* 9.4* 10.7* 10.2*  HCT 34.4*  --  33* 31* 36.6 31* 35* 31.8*  MCV 76.8*  --   --   --  71.9*  --   --  68.7*  PLT 283   < >  --  393 435* 405* 489* 572*   < > = values in this interval not displayed.   Lab Results  Component Value Date   TSH 2.44 06/04/2023   Lab Results  Component Value Date   HGBA1C 6.5 06/04/2023   Lab Results  Component Value Date   CHOL 218 (A) 11/29/2022   HDL 64 11/29/2022   LDLCALC 135 11/29/2022   TRIG 89 11/29/2022   CHOLHDL 5.3 01/26/2020    Significant Diagnostic Results in last 30 days:  No results found.  Assessment/Plan Coronary Artery Disease (CAD) Intermittent chest pain relieved by nitroglycerin . Recent severe episode raised concern for myocardial infarction. No current dyspnea. Inconsistent blood pressure monitoring during nitroglycerin  administration is a concern. - Ensure blood pressure monitoring during nitroglycerin  administration.  Constipation Persistent constipation with no improvement since last visit, potentially exacerbating chest pain episodes. - Increase constipation medication.  Hypertension Well-controlled on current regimen  DM Diet controlled.   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 Continue Statin  Family/ staff Communication: nursing  Labs/tests ordered:  none

## 2024-01-28 LAB — BASIC METABOLIC PANEL WITH GFR
BUN: 3 — AB (ref 4–21)
CO2: 30 — AB (ref 13–22)
Chloride: 96 — AB (ref 99–108)
Creatinine: 0.8 (ref 0.5–1.1)
Glucose: 95
Potassium: 4 meq/L (ref 3.5–5.1)
Sodium: 133 — AB (ref 137–147)

## 2024-01-28 LAB — COMPREHENSIVE METABOLIC PANEL WITH GFR
Calcium: 8.8 (ref 8.7–10.7)
eGFR: 86

## 2024-01-29 ENCOUNTER — Non-Acute Institutional Stay (SKILLED_NURSING_FACILITY): Payer: Self-pay | Admitting: Nurse Practitioner

## 2024-01-29 ENCOUNTER — Encounter: Payer: Self-pay | Admitting: Nurse Practitioner

## 2024-01-29 DIAGNOSIS — I69359 Hemiplegia and hemiparesis following cerebral infarction affecting unspecified side: Secondary | ICD-10-CM | POA: Diagnosis not present

## 2024-01-29 NOTE — Progress Notes (Unsigned)
 Location:  Other [facility].  Nursing Home Room Number: [facility] [id number] Place of Service:  SNF [phone]) Harlene An, NP  PCP: Abdul Fine, MD  Patient Care Team: Abdul Fine, MD as PCP - General (Family Medicine) [name], MD as PCP - Cardiology (Cardiology)  Extended Emergency Contact Information Primary Emergency Contact: Lagoy (POA),[name]  [place] Home Phone: [phone] Mobile Phone: [phone] Relation: Sister Secondary Emergency Contact: [name] Address: [address]          [place], [place] [address] [place] Home Phone: [phone] Relation: Mother  Goals of care: Advanced Directive information    12/20/2023    2:08 PM  Advanced Directives  Does Patient Have a Medical Advance Directive? Yes  Type of Advance Directive Out of facility DNR (pink MOST or yellow form)  Does patient want to make changes to medical advance directive? No - Patient declined  Pre-existing out of facility DNR order (yellow form or pink MOST form) Yellow form placed in chart (order not valid for inpatient use)     Chief Complaint  Patient presents with   Numbness    Numbness.     HPI:  Pt is a 63 y.o. female seen today for an acute visit for Numbness.  Pt with hx of cva, MI, htn, hyperlipidemia, anxiety.  Reported to staff she felt like she had a stroke. 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 Reports chronic numb feeling to left side but worse.  She is able to speak clearly without any slurred speech. Mentation is baseline at this time.  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  Reports she does not wish to go to the hospital if symptoms worsen.  She also does not want CPR or to be kept alive by machine.  Denies chest pains, shortness of breath at this time Past Medical History:  Diagnosis Date   Acute ST elevation myocardial infarction  (STEMI) of inferolateral wall (HCC) 01/25/2020   Blind    Cholecystitis 04/22/2021   History of migraine headaches    Hyperlipemia    Myocardial infarction [facility])    Stroke [facility])    Past Surgical History:  Procedure Laterality Date   ABDOMINAL HYSTERECTOMY     BACK SURGERY     CARDIAC CATHETERIZATION     CORONARY/GRAFT ACUTE MI REVASCULARIZATION N/A 01/25/2020   Procedure: Coronary/Graft Acute MI Revascularization;  Surgeon: [name], MD;  Location: [facility] INVASIVE CV LAB;  Service: Cardiovascular;  Laterality: N/A;   FRACTURE SURGERY     IR CATHETER TUBE CHANGE  08/10/2021   LEFT HEART CATH AND CORONARY ANGIOGRAPHY N/A 01/25/2020   Procedure: LEFT HEART CATH AND CORONARY ANGIOGRAPHY;  Surgeon: [name], MD;  Location: [facility] INVASIVE CV LAB;  Service: Cardiovascular;  Laterality: N/A;   SKIN GRAFT Right    TOTAL ABDOMINAL HYSTERECTOMY Bilateral     Allergies  Allergen Reactions   Levaquin [Levofloxacin In D5w] Anaphylaxis and Swelling   Iodinated Contrast Media Itching    Severe itching   Other     Dust mites and opiates (all of them)   Latex Dermatitis    Outpatient Encounter Medications as of 01/29/2024  Medication Sig   amLODipine (NORVASC) 2.5 MG tablet Take 2.5 mg by mouth daily.   aspirin  EC 81 MG tablet Take 81 mg by mouth daily.   Azelastine  HCl 0.15 % SOLN Place 2 sprays  into both nostrils 2 (two) times daily.   Cholecalciferol (VITAMIN D3) 1.25 MG (50000 UT) CAPS Take 1 capsule by mouth once a week.  every Mon for Supplement   Dextromethorphan-Benzocaine (COUGH/SORE THROAT LOZENGES MT) Use as directed 1 drop in the mouth or throat every 4 (four) hours as needed.   diazepam  (VALIUM ) 10 MG tablet TAKE 1 TABLET BY MOUTH FOUR TIMES A DAY   ferrous sulfate 325 (65 FE) MG EC tablet Take 325 mg by mouth. Every Monday, Wednesday and Friday.   ibuprofen  (ADVIL ) 800 MG tablet Take 800 mg by mouth every 8 (eight) hours as needed for mild pain or moderate pain.  (Patient taking differently: Take 800 mg by mouth every 12 (twelve) hours as needed for mild pain (pain score 1-3) or moderate pain (pain score 4-6).)   isosorbide mononitrate (IMDUR) 60 MG 24 hr tablet Take 60 mg by mouth daily.   lansoprazole (PREVACID) 15 MG capsule Take 15 mg by mouth 2 (two) times daily before a meal.   levothyroxine  (SYNTHROID ) 25 MCG tablet Take 25 mcg by mouth daily before breakfast.   losartan  (COZAAR ) 100 MG tablet Take 100 mg by mouth daily.   magnesium  hydroxide (MILK OF MAGNESIA) 400 MG/5ML suspension Take 5 mLs by mouth every 6 (six) hours as needed for mild constipation.   nitroGLYCERIN  (NITROSTAT ) 0.4 MG SL tablet Place 0.4 mg under the tongue every 5 (five) minutes x 3 doses as needed for chest pain.    nystatin  powder Apply 1 Application topically 3 (three) times daily.   OLANZapine  (ZYPREXA ) 5 MG tablet Take 5 mg by mouth daily.   ondansetron  (ZOFRAN -ODT) 4 MG disintegrating tablet Take 4 mg by mouth every 4 (four) hours as needed for nausea or vomiting.   polyethylene glycol (MIRALAX  / GLYCOLAX ) 17 g packet Take 17 g by mouth daily.   promethazine  (PHENERGAN ) 12.5 MG suppository Place 1 suppository (12.5 mg total) rectally every 6 (six) hours as needed for nausea or vomiting.   ranolazine (RANEXA) 500 MG 12 hr tablet Take 500 mg by mouth 2 (two) times daily.   rosuvastatin (CRESTOR) 5 MG tablet Take 5 mg by mouth daily. Every Tuesday,Thursday,Sat   senna-docusate (SENOKOT-S) 8.6-50 MG tablet Take 2 tablets by mouth at bedtime.   Vibegron  (GEMTESA ) 75 MG TABS Take 75 mg by mouth daily.   alum & mag hydroxide-simeth (MAALOX PLUS) 400-400-40 MG/5ML suspension Take 15 mLs by mouth every 4 (four) hours as needed for indigestion. (Patient not taking: Reported on 01/29/2024)   sertraline (ZOLOFT) 100 MG tablet Take 50 mg by mouth daily. (Patient not taking: Reported on 01/29/2024)   No facility-administered encounter medications on file as of 01/29/2024.    Review of  Systems  Constitutional:  Negative for activity change, appetite change, fatigue and unexpected weight change.  HENT:  Negative for congestion and hearing loss.   Eyes: Negative.   Respiratory:  Negative for cough and shortness of breath.   Cardiovascular:  Negative for chest pain, palpitations and leg swelling.  Gastrointestinal:  Negative for abdominal pain, constipation and diarrhea.  Genitourinary:  Negative for difficulty urinating and dysuria.  Musculoskeletal:  Negative for arthralgias and myalgias.  Skin:  Negative for color change and wound.  Neurological:  Positive for facial asymmetry and numbness. Negative for dizziness, syncope, speech difficulty and weakness.  Psychiatric/Behavioral:  Negative for agitation, behavioral problems and confusion.     Immunization History  Administered Date(s) Administered   Influenza-Unspecified 04/28/2011, 06/14/2012   Moderna  Sars-Covid-2 Vaccination 09/08/2019, 10/06/2019, 06/11/2020   Tdap 09/03/2013, 11/15/2016   Zoster Recombinant(Shingrix) 12/08/2022, 03/10/2023   Pertinent  Health Maintenance Due  Topic Date Due   Colonoscopy  10/10/2024 (Originally 07/09/2006)   OPHTHALMOLOGY EXAM  10/22/2024 (Originally 07/10/1971)   FOOT EXAM  04/16/2024   HEMOGLOBIN A1C  05/31/2024   MAMMOGRAM  11/29/2024   INFLUENZA VACCINE  Discontinued      02/09/2022    1:51 AM 03/01/2022    7:43 AM 05/28/2022    2:13 AM 12/07/2022    2:19 PM 12/27/2023   11:28 AM  Fall Risk  Falls in the past year?    Exclusion - non ambulatory 0  (RETIRED) Patient Fall Risk Level Moderate fall risk  Moderate fall risk  Moderate fall risk        Data saved with a previous flowsheet row definition   Functional Status Survey:    Vitals:   01/29/24 1240  BP: (!) 150/90  Pulse: 72  Resp: 15  Temp: 97.8 F (36.6 C)  SpO2: 96%  Weight: 178 lb 9.6 oz (81 kg)  Height: 5' 4 (1.626 m)   Body mass index is 30.66 kg/m. Physical Exam Constitutional:      General:  She is not in acute distress.    Appearance: She is well-developed. She is not diaphoretic.  HENT:     Head: Normocephalic and atraumatic.     Mouth/Throat:     Pharynx: No oropharyngeal exudate.  Eyes:     Conjunctiva/sclera: Conjunctivae normal.     Pupils: Pupils are equal, round, and reactive to light.  Cardiovascular:     Rate and Rhythm: Normal rate and regular rhythm.     Heart sounds: Normal heart sounds.  Pulmonary:     Effort: Pulmonary effort is normal.     Breath sounds: Normal breath sounds.  Abdominal:     General: Bowel sounds are normal.     Palpations: Abdomen is soft.  Musculoskeletal:     Cervical back: Normal range of motion and neck supple.     Right lower leg: No edema.     Left lower leg: No edema.  Skin:    General: Skin is warm and dry.  Neurological:     Mental Status: She is alert. Mental status is at baseline.     Comments: Left sided weakness, neurologically she is at baseline.   Psychiatric:        Mood and Affect: Mood normal.     Labs reviewed: Recent Labs    10/04/23 0832 11/05/23 0000 11/11/23 1336 11/29/23 0000 12/25/23 0000 01/28/24 0000  NA 135   < > 130* 130* 132* 133*  K 4.1   < > 4.1 4.2 4.3 4.0  CL 97*   < > 94* 92* 96* 96*  CO2 26   < > 24 27* 26* 30*  GLUCOSE 152*  --  155*  --   --   --   BUN <5*   < > <5* 5 3* 3*  CREATININE 0.71   < > 0.73 0.9 0.7 0.8  CALCIUM  9.6   < > 9.0 9.1 8.8 8.8   < > = values in this interval not displayed.   Recent Labs    06/04/23 0000 11/08/23 0000 11/11/23 1336  AST 10* 25 16  ALT 7 10 12   ALKPHOS 91 94 98  BILITOT  --   --  0.7  PROT  --   --  7.4  ALBUMIN 3.8  3.9 4.0   Recent Labs    09/03/23 0000 10/04/23 0832 11/05/23 0000 11/08/23 0000 11/11/23 1336 11/29/23 0000  WBC 10.6 10.3   < > 9.3 13.4* 9.9  NEUTROABS 7,929.00  --   --  6,222.00  --  7,554.00  HGB 9.3* 11.1*   < > 10.7* 10.2* 12.0  HCT 31* 36.6   < > 35* 31.8* 39  MCV  --  71.9*  --   --  68.7*  --   PLT  393 435*   < > 489* 572* 461*   < > = values in this interval not displayed.   Lab Results  Component Value Date   TSH 2.44 06/04/2023   Lab Results  Component Value Date   HGBA1C 6.4 11/29/2023   Lab Results  Component Value Date   CHOL 207 (A) 11/29/2023   HDL 59 11/29/2023   LDLCALC 117 11/29/2023   TRIG 185 (A) 11/29/2023   CHOLHDL 5.3 01/26/2020    Significant Diagnostic Results in last 30 days:  No results found.  Assessment/Plan 1. 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 She expressed she would not want to go back to the hospital if symptoms progressed. She has been DNR and will update chart She continues on ASA She has declined statin in the past but now on crestor 5 mg daily Recheck bp in normal range. 131/84 - Do not attempt resuscitation (DNR)  Total time 25:  time greater than 50% of total time spent doing pt counseling and coordination of care with patient and staff.    Jessica K. Caro BODILY [facility] [phone]

## 2024-01-31 ENCOUNTER — Encounter: Payer: Self-pay | Admitting: Student

## 2024-02-04 ENCOUNTER — Non-Acute Institutional Stay (SKILLED_NURSING_FACILITY): Payer: Self-pay | Admitting: Adult Health

## 2024-02-04 ENCOUNTER — Encounter: Payer: Self-pay | Admitting: Adult Health

## 2024-02-04 DIAGNOSIS — R079 Chest pain, unspecified: Secondary | ICD-10-CM

## 2024-02-04 DIAGNOSIS — I10 Essential (primary) hypertension: Secondary | ICD-10-CM

## 2024-02-04 DIAGNOSIS — E039 Hypothyroidism, unspecified: Secondary | ICD-10-CM

## 2024-02-04 DIAGNOSIS — I2089 Other forms of angina pectoris: Secondary | ICD-10-CM | POA: Diagnosis not present

## 2024-02-04 NOTE — Progress Notes (Unsigned)
 Location:  Other ([facility] Room Number: 405 [id number] Place of Service:  SNF [phone]) Provider:  Medina-Vargas, Kelcey Korus, DNP, FNP-BC  Patient Care Team: Abdul Fine, MD as PCP - General (Family Medicine) [name], MD as PCP - Cardiology (Cardiology)  Extended Emergency Contact Information Primary Emergency Contact: Coyt (POA),[name]  [place] Home Phone: [phone] Mobile Phone: [phone] Relation: Sister Secondary Emergency Contact: [name] Address: [address]          [place], [place] [address] [place] Home Phone: [phone] Relation: Mother  Code Status:  DNR  Goals of care: Advanced Directive information    12/20/2023    2:08 PM  Advanced Directives  Does Patient Have a Medical Advance Directive? Yes  Type of Advance Directive Out of facility DNR (pink MOST or yellow form)  Does patient want to make changes to medical advance directive? No - Patient declined  Pre-existing out of facility DNR order (yellow form or pink MOST form) Yellow form placed in chart (order not valid for inpatient use)     Chief Complaint  Patient presents with  . Acute Visit    Chest pain last night    HPI:  Pt is a 63 y.o. Medina seen today for an acute visit regarding chest pain. She is a resident of [facility].  She has a PMH of CAD with multiple prior stents, history of CVA with left-sided Hemiparesis, wheelchair-bound, blindness, diabetes, hypertension, hyperlipidemia.  She had NSTEMI in 09/2013 with PCI of LAD/RCA.SABRA  She was noted to have reduced LVEF 35%.  She was readmitted to the hospital on 5/24 and underwent PCI with mild to distal LAD.  She again, had STEMI on 12/2013.  In 2021 she had another NSTEMI and initially left ED because she preferred treatment with palliative approach.  She then had heart catheterization showing three-vessel disease and underwent PCI of mid left circumflex into  OM.  LVEF was 30 to 35% on catheterization.  She consulted cardiology on 10/15/23 and was started Ranexa 500 mg twice a day. She declined further evaluation such as stress or echo.  She complained of chest pain yesterday which resolved after giving the 2nd dose of PRN Nitroglycerin . She was seen today in her room while on her bed. She denies chest pains today.    Past Medical History:  Diagnosis Date  . Acute ST elevation myocardial infarction (STEMI) of inferolateral wall (HCC) 01/25/2020  . Blind   . Cholecystitis 04/22/2021  . History of migraine headaches   . Hyperlipemia   . Myocardial infarction (HCC)   . Stroke [facility])    Past Surgical History:  Procedure Laterality Date  . ABDOMINAL HYSTERECTOMY    . BACK SURGERY    . CARDIAC CATHETERIZATION    . CORONARY/GRAFT ACUTE MI REVASCULARIZATION N/A 01/25/2020   Procedure: Coronary/Graft Acute MI Revascularization;  Surgeon: [name], MD;  Location: [facility] INVASIVE CV LAB;  Service: Cardiovascular;  Laterality: N/A;  . FRACTURE SURGERY    . IR CATHETER TUBE CHANGE  08/10/2021  . LEFT HEART CATH AND CORONARY ANGIOGRAPHY N/A 01/25/2020   Procedure: LEFT HEART CATH AND CORONARY ANGIOGRAPHY;  Surgeon: [name], MD;  Location: [facility] INVASIVE CV LAB;  Service: Cardiovascular;  Laterality: N/A;  . SKIN GRAFT Right   . TOTAL ABDOMINAL HYSTERECTOMY Bilateral     Allergies  Allergen Reactions  . Levaquin [Levofloxacin In D5w] Anaphylaxis and Swelling  . Iodinated  Contrast Media Itching    Severe itching  . Other     Dust mites and opiates (all of them)  . Latex Dermatitis    Outpatient Encounter Medications as of 02/04/2024  Medication Sig  . alum & mag hydroxide-simeth (MAALOX PLUS) 400-400-40 MG/5ML suspension Take 15 mLs by mouth every 4 (four) hours as needed for indigestion. (Patient not taking: Reported on 01/29/2024)  . amLODipine (NORVASC) 2.5 MG tablet Take 2.5 mg by mouth daily.  . aspirin  EC 81 MG tablet Take 81  mg by mouth daily.  . Azelastine  HCl 0.15 % SOLN Place 2 sprays into both nostrils 2 (two) times daily.  . Cholecalciferol (VITAMIN D3) 1.25 MG (50000 UT) CAPS Take 1 capsule by mouth once a week.  every Mon for Supplement  . Dextromethorphan-Benzocaine (COUGH/SORE THROAT LOZENGES MT) Use as directed 1 drop in the mouth or throat every 4 (four) hours as needed.  . diazepam  (VALIUM ) 10 MG tablet TAKE 1 TABLET BY MOUTH FOUR TIMES A DAY  . ferrous sulfate 325 (65 FE) MG EC tablet Take 325 mg by mouth. Every Monday, Wednesday and Friday.  . ibuprofen  (ADVIL ) 800 MG tablet Take 800 mg by mouth every 8 (eight) hours as needed for mild pain or moderate pain. (Patient taking differently: Take 800 mg by mouth every 12 (twelve) hours as needed for mild pain (pain score 1-3) or moderate pain (pain score 4-6).)  . isosorbide mononitrate (IMDUR) 60 MG 24 hr tablet Take 60 mg by mouth daily.  . lansoprazole (PREVACID) 15 MG capsule Take 15 mg by mouth 2 (two) times daily before a meal.  . levothyroxine  (SYNTHROID ) 25 MCG tablet Take 25 mcg by mouth daily before breakfast.  . losartan  (COZAAR ) 100 MG tablet Take 100 mg by mouth daily.  . magnesium  hydroxide (MILK OF MAGNESIA) 400 MG/5ML suspension Take 5 mLs by mouth every 6 (six) hours as needed for mild constipation.  . nitroGLYCERIN  (NITROSTAT ) 0.4 MG SL tablet Place 0.4 mg under the tongue every 5 (five) minutes x 3 doses as needed for chest pain.   . nystatin  powder Apply 1 Application topically 3 (three) times daily.  . OLANZapine  (ZYPREXA ) 5 MG tablet Take 5 mg by mouth daily.  . ondansetron  (ZOFRAN -ODT) 4 MG disintegrating tablet Take 4 mg by mouth every 4 (four) hours as needed for nausea or vomiting.  . polyethylene glycol (MIRALAX  / GLYCOLAX ) 17 g packet Take 17 g by mouth daily.  . promethazine  (PHENERGAN ) 12.5 MG suppository Place 1 suppository (12.5 mg total) rectally every 6 (six) hours as needed for nausea or vomiting.  . ranolazine (RANEXA) 500  MG 12 hr tablet Take 500 mg by mouth 2 (two) times daily.  . rosuvastatin (CRESTOR) 5 MG tablet Take 5 mg by mouth daily. Every Tuesday,Thursday,Sat  . senna-docusate (SENOKOT-S) 8.6-50 MG tablet Take 2 tablets by mouth at bedtime.  . sertraline (ZOLOFT) 100 MG tablet Take 50 mg by mouth daily. (Patient not taking: Reported on 01/29/2024)  . Vibegron  (GEMTESA ) 75 MG TABS Take 75 mg by mouth daily.   No facility-administered encounter medications on file as of 02/04/2024.    Review of Systems  Constitutional:  Negative for appetite change, chills, fatigue and fever.  HENT:  Negative for congestion, hearing loss, rhinorrhea and sore throat.   Eyes: Negative.   Respiratory:  Negative for cough, shortness of breath and wheezing.   Cardiovascular:  Positive for chest pain. Negative for palpitations and leg swelling.  Gastrointestinal:  Negative  for abdominal pain, constipation, diarrhea, nausea and vomiting.  Genitourinary:  Negative for dysuria.  Musculoskeletal:  Negative for arthralgias, back pain and myalgias.  Skin:  Negative for color change, rash and wound.  Neurological:  Negative for dizziness, weakness and headaches.  Psychiatric/Behavioral:  Negative for behavioral problems. The patient is not nervous/anxious.     Immunization History  Administered Date(s) Administered  . Influenza-Unspecified 04/28/2011, 06/14/2012  . Moderna Sars-Covid-2 Vaccination 09/08/2019, 10/06/2019, 06/11/2020  . Tdap 09/03/2013, 11/15/2016  . Zoster Recombinant(Shingrix) 12/08/2022, 03/10/2023   Pertinent  Health Maintenance Due  Topic Date Due  . Colonoscopy  10/10/2024 (Originally 07/09/2006)  . OPHTHALMOLOGY EXAM  10/22/2024 (Originally 07/10/1971)  . FOOT EXAM  04/16/2024  . HEMOGLOBIN A1C  05/31/2024  . MAMMOGRAM  11/29/2024  . INFLUENZA VACCINE  Discontinued      02/09/2022    1:51 AM 03/01/2022    7:43 AM 05/28/2022    2:13 AM 12/07/2022    2:19 PM 12/27/2023   11:28 AM  Fall Risk  Falls in  the past year?    Exclusion - non ambulatory 0  (RETIRED) Patient Fall Risk Level Moderate fall risk  Moderate fall risk  Moderate fall risk        Data saved with a previous flowsheet row definition     Vitals:   02/04/24 1748  BP: 138/70  Pulse: 83  Resp: 20  Temp: 97.6 F (36.4 C)  SpO2: 96%  Weight: 176 lb (79.8 kg)  Height: 5' 1 (1.549 m)   Body mass index is 33.25 kg/m.  Physical Exam Constitutional:      General: She is not in acute distress.    Appearance: She is obese.  HENT:     Head: Normocephalic and atraumatic.     Nose: Nose normal.     Mouth/Throat:     Mouth: Mucous membranes are moist.  Eyes:     Conjunctiva/sclera: Conjunctivae normal.     Comments: Blind on both eyes  Cardiovascular:     Rate and Rhythm: Normal rate and regular rhythm.  Pulmonary:     Effort: Pulmonary effort is normal.     Breath sounds: Normal breath sounds.  Abdominal:     General: Bowel sounds are normal.     Palpations: Abdomen is soft.  Genitourinary:    Comments: Has suprapubic catheter. Musculoskeletal:        General: Normal range of motion.     Cervical back: Normal range of motion.  Skin:    General: Skin is warm and dry.  Neurological:     Mental Status: She is alert and oriented to person, place, and time.     Motor: Weakness present.     Comments: Left-sided weakness  Psychiatric:        Mood and Affect: Mood normal.        Behavior: Behavior normal.      Labs reviewed: Recent Labs    10/04/23 0832 11/05/23 0000 11/11/23 1336 11/29/23 0000 12/25/23 0000 01/28/24 0000  NA 135   < > 130* 130* 132* 133*  K 4.1   < > 4.1 4.2 4.3 4.0  CL 97*   < > 94* 92* 96* 96*  CO2 26   < > 24 27* 26* 30*  GLUCOSE 152*  --  155*  --   --   --   BUN <5*   < > <5* 5 3* 3*  CREATININE 0.71   < > 0.73 0.9 0.7 0.8  CALCIUM  9.6   < > 9.0 9.1 8.8 8.8   < > = values in this interval not displayed.   Recent Labs    06/04/23 0000 11/08/23 0000 11/11/23 1336  AST  10* 25 16  ALT 7 10 12   ALKPHOS 91 94 98  BILITOT  --   --  0.7  PROT  --   --  7.4  ALBUMIN 3.8 3.9 4.0   Recent Labs    09/03/23 0000 10/04/23 0832 11/05/23 0000 11/08/23 0000 11/11/23 1336 11/29/23 0000  WBC 10.6 10.3   < > 9.3 13.4* 9.9  NEUTROABS 7,929.00  --   --  6,222.00  --  7,554.00  HGB 9.3* 11.1*   < > 10.7* 10.2* 12.0  HCT 31* 36.6   < > 35* 31.8* 39  MCV  --  71.9*  --   --  68.7*  --   PLT 393 435*   < > 489* 572* 461*   < > = values in this interval not displayed.   Lab Results  Component Value Date   TSH 2.44 06/04/2023   Lab Results  Component Value Date   HGBA1C 6.4 11/29/2023   Lab Results  Component Value Date   CHOL 207 (A) 11/29/2023   HDL 59 11/29/2023   LDLCALC 117 11/29/2023   TRIG 185 (A) 11/29/2023   CHOLHDL 5.3 01/26/2020    Significant Diagnostic Results in last 30 days:  No results found.  Assessment/Plan  1. Chronic stable angina (HCC) (Primary) -   Continue Ranexa 500 mg twice a day -   Continue isosorbide and and 24 hours 60 mg daily - Continue aspirin  EC 81 mg daily - Check CBC, BMP, troponin  2. Essential hypertension -  BP 138/70, stable -   Continue amlodipine 2.5 mg daily  3. Hypothyroidism, unspecified type Lab Results  Component Value Date   TSH 2.44 06/04/2023    Over-   continue levothyroxine  25 mcg daily   Family/ staff Communication: Discussed plan of care with resident and charge nurse.  Labs/tests ordered: CBC, BMP, troponin,    Jereld Serum, DNP, MSN, FNP-BC [facility] [phone] (Monday-Friday 8:00 a.m. - 5:00 p.m.) [phone] (after hours)

## 2024-02-07 ENCOUNTER — Ambulatory Visit (INDEPENDENT_AMBULATORY_CARE_PROVIDER_SITE_OTHER): Admitting: Physician Assistant

## 2024-02-07 DIAGNOSIS — Z435 Encounter for attention to cystostomy: Secondary | ICD-10-CM

## 2024-02-07 LAB — COMPREHENSIVE METABOLIC PANEL WITH GFR: Calcium: 9.2 (ref 8.7–10.7)

## 2024-02-07 LAB — BASIC METABOLIC PANEL WITH GFR
BUN: 4 (ref 4–21)
CO2: 23 — AB (ref 13–22)
Chloride: 98 — AB (ref 99–108)
Creatinine: 0.8 (ref 0.5–1.1)
Glucose: 118
Potassium: 4.5 meq/L (ref 3.5–5.1)
Sodium: 136 — AB (ref 137–147)

## 2024-02-07 LAB — CBC AND DIFFERENTIAL
HCT: 44 (ref 36–46)
Hemoglobin: 14.2 (ref 12.0–16.0)
Neutrophils Absolute: 7353
Platelets: 276 K/uL (ref 150–400)
WBC: 9.1

## 2024-02-07 LAB — CBC: RBC: 5.32 — AB (ref 3.87–5.11)

## 2024-02-07 NOTE — Progress Notes (Signed)
 Suprapubic Cath Change  Patient is present today for a suprapubic catheter change due to urinary retention.  8ml of water was drained from the balloon, a 18FR Silastic foley cath was removed from the tract with out difficulty.  Site was cleaned and prepped in a sterile fashion with betadine.  A 18FR Silastic foley cath was replaced into the tract no complications were noted. Urine return was not noted, 10 ml of sterile water was inflated into the balloon and a night bag was attached for drainage.  Patient tolerated well. A night bag was given to patient and proper instruction was given on how to switch bags.    Performed by: Soni Kegel, PA-C and Humberta Magallon-Mariche, CMA  Follow up: Return in about 4 weeks (around 03/06/2024) for SPT exchange.

## 2024-02-08 ENCOUNTER — Non-Acute Institutional Stay: Payer: Self-pay | Admitting: Student

## 2024-02-08 ENCOUNTER — Encounter: Payer: Self-pay | Admitting: Student

## 2024-02-08 NOTE — Progress Notes (Unsigned)
 Location:  [facility].  Nursing Home Room Number: [facility] Place of Service:  SNF [phone]) Provider:  Abdul Fine, MD  Patient Care Team: Abdul Fine, MD as PCP - General (Family Medicine) [name], MD as PCP - Cardiology (Cardiology)  Extended Emergency Contact Information Primary Emergency Contact: [facility] [place]  [place] Home Phone: [phone] Mobile Phone: [phone] Relation: Sister Secondary Emergency Contact: [name] Address: [address]          [place], [place] [address] [place] Home Phone: [phone] Relation: Mother  Code Status:  DNR Goals of care: Advanced Directive information    12/20/2023    2:08 PM  Advanced Directives  Does Patient Have a Medical Advance Directive? Yes  Type of Advance Directive Out of facility DNR (pink MOST or yellow form)  Does patient want to make changes to medical advance directive? No - Patient declined  Pre-existing out of facility DNR order (yellow form or pink MOST form) Yellow form placed in chart (order not valid for inpatient use)     Chief Complaint  Patient presents with   Chest Pain    Follow up Chest Pains.     HPI:  Pt is a 63 y.o. female seen today to Follow up Chest Pains.    Past Medical History:  Diagnosis Date   Acute ST elevation myocardial infarction (STEMI) of inferolateral wall (HCC) 01/25/2020   Blind    Cholecystitis 04/22/2021   History of migraine headaches    Hyperlipemia    Myocardial infarction [facility])    Stroke [facility])    Past Surgical History:  Procedure Laterality Date   ABDOMINAL HYSTERECTOMY     BACK SURGERY     CARDIAC CATHETERIZATION     CORONARY/GRAFT ACUTE MI REVASCULARIZATION N/A 01/25/2020   Procedure: Coronary/Graft Acute MI Revascularization;  Surgeon: [name], MD;  Location: [facility] INVASIVE CV LAB;  Service: Cardiovascular;  Laterality: N/A;   FRACTURE SURGERY     IR CATHETER TUBE  CHANGE  08/10/2021   LEFT HEART CATH AND CORONARY ANGIOGRAPHY N/A 01/25/2020   Procedure: LEFT HEART CATH AND CORONARY ANGIOGRAPHY;  Surgeon: [name], MD;  Location: [facility] INVASIVE CV LAB;  Service: Cardiovascular;  Laterality: N/A;   SKIN GRAFT Right    TOTAL ABDOMINAL HYSTERECTOMY Bilateral     Allergies  Allergen Reactions   Levaquin [Levofloxacin In D5w] Anaphylaxis and Swelling   Iodinated Contrast Media Itching    Severe itching   Other     Dust mites and opiates (all of them)   Latex Dermatitis    Outpatient Encounter Medications as of 02/08/2024  Medication Sig   amLODipine (NORVASC) 2.5 MG tablet Take 2.5 mg by mouth daily.   aspirin  EC 81 MG tablet Take 81 mg by mouth daily.   Azelastine  HCl 0.15 % SOLN Place 2 sprays into both nostrils 2 (two) times daily.   Cholecalciferol (VITAMIN D3) 1.25 MG (50000 UT) CAPS Take 1 capsule by mouth once a week.  every Mon for Supplement   Dextromethorphan-Benzocaine (COUGH/SORE THROAT LOZENGES MT) Use as directed 1 drop in the mouth or throat every 4 (four) hours as needed.   diazepam  (VALIUM ) 10 MG tablet TAKE 1 TABLET BY MOUTH FOUR TIMES A DAY   ferrous sulfate 325 (65 FE) MG EC tablet Take 325 mg by mouth. Every Monday, Wednesday and Friday.   ibuprofen  (ADVIL ) 800 MG tablet Take 800 mg by mouth every 8 (eight)  hours as needed for mild pain or moderate pain. (Patient taking differently: Take 800 mg by mouth every 12 (twelve) hours as needed for mild pain (pain score 1-3) or moderate pain (pain score 4-6).)   isosorbide mononitrate (IMDUR) 60 MG 24 hr tablet Take 60 mg by mouth daily.   lansoprazole (PREVACID) 15 MG capsule Take 15 mg by mouth 2 (two) times daily before a meal.   levothyroxine  (SYNTHROID ) 25 MCG tablet Take 25 mcg by mouth daily before breakfast.   losartan  (COZAAR ) 100 MG tablet Take 100 mg by mouth daily.   magnesium  hydroxide (MILK OF MAGNESIA) 400 MG/5ML suspension Take 5 mLs by mouth every 6 (six) hours as  needed for mild constipation.   nitroGLYCERIN  (NITROSTAT ) 0.4 MG SL tablet Place 0.4 mg under the tongue every 5 (five) minutes x 3 doses as needed for chest pain.    OLANZapine  (ZYPREXA ) 5 MG tablet Take 5 mg by mouth daily.   ondansetron  (ZOFRAN -ODT) 4 MG disintegrating tablet Take 4 mg by mouth every 4 (four) hours as needed for nausea or vomiting.   polyethylene glycol (MIRALAX  / GLYCOLAX ) 17 g packet Take 17 g by mouth daily.   promethazine  (PHENERGAN ) 12.5 MG suppository Place 1 suppository (12.5 mg total) rectally every 6 (six) hours as needed for nausea or vomiting.   ranolazine (RANEXA) 500 MG 12 hr tablet Take 500 mg by mouth 2 (two) times daily.   rosuvastatin (CRESTOR) 5 MG tablet Take 5 mg by mouth daily. Every Tuesday,Thursday,Sat   senna-docusate (SENOKOT-S) 8.6-50 MG tablet Take 2 tablets by mouth at bedtime.   Vibegron  (GEMTESA ) 75 MG TABS Take 75 mg by mouth daily.   alum & mag hydroxide-simeth (MAALOX PLUS) 400-400-40 MG/5ML suspension Take 15 mLs by mouth every 4 (four) hours as needed for indigestion. (Patient not taking: Reported on 02/08/2024)   nystatin  powder Apply 1 Application topically 3 (three) times daily. (Patient not taking: Reported on 02/08/2024)   sertraline (ZOLOFT) 100 MG tablet Take 50 mg by mouth daily. (Patient not taking: Reported on 02/08/2024)   No facility-administered encounter medications on file as of 02/08/2024.    Review of Systems  Immunization History  Administered Date(s) Administered   Influenza-Unspecified 04/28/2011, 06/14/2012   Moderna Sars-Covid-2 Vaccination 09/08/2019, 10/06/2019, 06/11/2020   Tdap 09/03/2013, 11/15/2016   Zoster Recombinant(Shingrix) 12/08/2022, 03/10/2023   Pertinent  Health Maintenance Due  Topic Date Due   Colonoscopy  10/10/2024 (Originally 07/09/2006)   OPHTHALMOLOGY EXAM  10/22/2024 (Originally 07/10/1971)   FOOT EXAM  04/16/2024   HEMOGLOBIN A1C  05/31/2024   MAMMOGRAM  11/29/2024   INFLUENZA VACCINE   Discontinued      02/09/2022    1:51 AM 03/01/2022    7:43 AM 05/28/2022    2:13 AM 12/07/2022    2:19 PM 12/27/2023   11:28 AM  Fall Risk  Falls in the past year?    Exclusion - non ambulatory 0  (RETIRED) Patient Fall Risk Level Moderate fall risk  Moderate fall risk  Moderate fall risk        Data saved with a previous flowsheet row definition   Functional Status Survey:    Vitals:   02/08/24 1405 02/08/24 1434  BP: (!) 147/87 (!) 143/83  Pulse: 80   Resp: 20   Temp: 97.6 F (36.4 C)   SpO2: 95%   Weight: 176 lb (79.8 kg)   Height: 5' 1 (1.549 m)    Body mass index is 33.25 kg/m. Physical Exam  Labs  reviewed: Recent Labs    10/04/23 0832 11/05/23 0000 11/11/23 1336 11/29/23 0000 12/25/23 0000 01/28/24 0000 02/07/24 0000  NA 135   < > 130*   < > 132* 133* 136*  K 4.1   < > 4.1   < > 4.3 4.0 4.5  CL 97*   < > 94*   < > 96* 96* 98*  CO2 26   < > 24   < > 26* 30* 23*  GLUCOSE 152*  --  155*  --   --   --   --   BUN <5*   < > <5*   < > 3* 3* 4  CREATININE 0.71   < > 0.73   < > 0.7 0.8 0.8  CALCIUM  9.6   < > 9.0   < > 8.8 8.8 9.2   < > = values in this interval not displayed.   Recent Labs    06/04/23 0000 11/08/23 0000 11/11/23 1336  AST 10* 25 16  ALT 7 10 12   ALKPHOS 91 94 98  BILITOT  --   --  0.7  PROT  --   --  7.4  ALBUMIN 3.8 3.9 4.0   Recent Labs    10/04/23 0832 11/05/23 0000 11/08/23 0000 11/11/23 1336 11/29/23 0000 02/07/24 0000  WBC 10.3   < > 9.3 13.4* 9.9 9.1  NEUTROABS  --   --  6,222.00  --  7,554.00 7,353.00  HGB 11.1*   < > 10.7* 10.2* 12.0 14.2  HCT 36.6   < > 35* 31.8* 39 44  MCV 71.9*  --   --  68.7*  --   --   PLT 435*   < > 489* 572* 461* 276   < > = values in this interval not displayed.   Lab Results  Component Value Date   TSH 2.44 06/04/2023   Lab Results  Component Value Date   HGBA1C 6.4 11/29/2023   Lab Results  Component Value Date   CHOL 207 (A) 11/29/2023   HDL 59 11/29/2023   LDLCALC 117 11/29/2023    TRIG 185 (A) 11/29/2023   CHOLHDL 5.3 01/26/2020    Significant Diagnostic Results in last 30 days:  No results found.  Assessment/Plan There are no diagnoses linked to this encounter.   Family/ staff Communication: ***  Labs/tests ordered:  ***

## 2024-02-11 ENCOUNTER — Non-Acute Institutional Stay (SKILLED_NURSING_FACILITY): Payer: Self-pay | Admitting: Student

## 2024-02-11 ENCOUNTER — Encounter: Payer: Self-pay | Admitting: Student

## 2024-02-11 DIAGNOSIS — I25110 Atherosclerotic heart disease of native coronary artery with unstable angina pectoris: Secondary | ICD-10-CM

## 2024-02-11 NOTE — Progress Notes (Signed)
 Location:  [facility].  Nursing Home Room Number: [facility]A Place of Service:  SNF [phone]) Provider:  Abdul Fine, MD  Patient Care Team: Abdul Fine, MD as PCP - General (Family Medicine) [name], MD as PCP - Cardiology (Cardiology)  Extended Emergency Contact Information Primary Emergency Contact: [place] (POA),[name]  [place] Home Phone: [phone] Mobile Phone: [phone] Relation: Sister Secondary Emergency Contact: [name] Address: [address]          [place], [place] [address] [place] Home Phone: [phone] Relation: Mother  Code Status:  DNR Goals of care: Advanced Directive information    12/20/2023    2:08 PM  Advanced Directives  Does Patient Have a Medical Advance Directive? Yes  Type of Advance Directive Out of facility DNR (pink MOST or yellow form)  Does patient want to make changes to medical advance directive? No - Patient declined  Pre-existing out of facility DNR order (yellow form or pink MOST form) Yellow form placed in chart (order not valid for inpatient use)     Chief Complaint  Patient presents with   Chest Pain    Follow up Chest Pains.     HPI:  Pt is a 63 y.o. female seen today to Follow up Chest Pain.  History of Present Illness The patient, with a significant cardiac history, presents with chest pain.  She experienced chest pain last week, but has not had any chest pain today. She has a significant cardiac history, including the placement of six stents approximately ten years ago.  A troponin test conducted a week after the chest pain episode showed elevated levels.  She is currently using nitroglycerin  for chest pain management.  In terms of her social history, she used to run ten miles a day, indicating a previously active lifestyle.  Past Medical History - Coronary artery disease with history of chest pain and elevated troponin  levels  Surgical History: - Stent placement: Six stents placed  Medications - Nitroglycerin     Past Medical History:  Diagnosis Date   Acute ST elevation myocardial infarction (STEMI) of inferolateral wall (HCC) 01/25/2020   Blind    Cholecystitis 04/22/2021   History of migraine headaches    Hyperlipemia    Myocardial infarction (HCC)    Stroke [facility])    Past Surgical History:  Procedure Laterality Date   ABDOMINAL HYSTERECTOMY     BACK SURGERY     CARDIAC CATHETERIZATION     CORONARY/GRAFT ACUTE MI REVASCULARIZATION N/A 01/25/2020   Procedure: Coronary/Graft Acute MI Revascularization;  Surgeon: [name], MD;  Location: [facility] INVASIVE CV LAB;  Service: Cardiovascular;  Laterality: N/A;   FRACTURE SURGERY     IR CATHETER TUBE CHANGE  08/10/2021   LEFT HEART CATH AND CORONARY ANGIOGRAPHY N/A 01/25/2020   Procedure: LEFT HEART CATH AND CORONARY ANGIOGRAPHY;  Surgeon: [name], MD;  Location: [facility] INVASIVE CV LAB;  Service: Cardiovascular;  Laterality: N/A;   SKIN GRAFT Right    TOTAL ABDOMINAL HYSTERECTOMY Bilateral     Allergies  Allergen Reactions   Levaquin [Levofloxacin In D5w] Anaphylaxis and Swelling   Iodinated Contrast Media Itching    Severe itching   Other     Dust mites and opiates (all of them)   Latex Dermatitis    Outpatient Encounter Medications as of 02/11/2024  Medication Sig   alum & mag hydroxide-simeth (MAALOX PLUS) 400-400-40 MG/5ML suspension Take 15 mLs by mouth every 4 (  four) hours as needed for indigestion.   amLODipine (NORVASC) 2.5 MG tablet Take 2.5 mg by mouth daily.   aspirin  EC 81 MG tablet Take 81 mg by mouth daily.   Azelastine  HCl 0.15 % SOLN Place 2 sprays into both nostrils 2 (two) times daily.   Cholecalciferol (VITAMIN D3) 1.25 MG (50000 UT) CAPS Take 1 capsule by mouth once a week.  every Mon for Supplement   Dextromethorphan-Benzocaine (COUGH/SORE THROAT LOZENGES MT) Use as directed 1 drop in the mouth or throat  every 4 (four) hours as needed.   diazepam  (VALIUM ) 10 MG tablet TAKE 1 TABLET BY MOUTH FOUR TIMES A DAY   ferrous sulfate 325 (65 FE) MG EC tablet Take 325 mg by mouth. Every Monday, Wednesday and Friday.   ibuprofen  (ADVIL ) 800 MG tablet Take 800 mg by mouth every 8 (eight) hours as needed for mild pain or moderate pain.   isosorbide mononitrate (IMDUR) 60 MG 24 hr tablet Take 60 mg by mouth daily.   lansoprazole (PREVACID) 15 MG capsule Take 15 mg by mouth 2 (two) times daily before a meal.   levothyroxine  (SYNTHROID ) 25 MCG tablet Take 25 mcg by mouth daily before breakfast.   losartan  (COZAAR ) 100 MG tablet Take 100 mg by mouth daily.   magnesium  hydroxide (MILK OF MAGNESIA) 400 MG/5ML suspension Take 5 mLs by mouth every 6 (six) hours as needed for mild constipation.   nitroGLYCERIN  (NITROSTAT ) 0.4 MG SL tablet Place 0.4 mg under the tongue every 5 (five) minutes x 3 doses as needed for chest pain.    OLANZapine  (ZYPREXA ) 5 MG tablet Take 5 mg by mouth daily.   ondansetron  (ZOFRAN -ODT) 4 MG disintegrating tablet Take 4 mg by mouth every 4 (four) hours as needed for nausea or vomiting.   polyethylene glycol (MIRALAX  / GLYCOLAX ) 17 g packet Take 17 g by mouth daily.   promethazine  (PHENERGAN ) 12.5 MG suppository Place 1 suppository (12.5 mg total) rectally every 6 (six) hours as needed for nausea or vomiting.   ranolazine (RANEXA) 500 MG 12 hr tablet Take 500 mg by mouth 2 (two) times daily.   rosuvastatin (CRESTOR) 5 MG tablet Take 5 mg by mouth daily. Every Tuesday,Thursday,Sat   senna-docusate (SENOKOT-S) 8.6-50 MG tablet Take 2 tablets by mouth at bedtime.   Vibegron  (GEMTESA ) 75 MG TABS Take 75 mg by mouth daily.   nystatin  powder Apply 1 Application topically 3 (three) times daily. (Patient not taking: Reported on 02/11/2024)   sertraline (ZOLOFT) 100 MG tablet Take 50 mg by mouth daily. (Patient not taking: Reported on 02/11/2024)   No facility-administered encounter medications on file  as of 02/11/2024.    Review of Systems  Immunization History  Administered Date(s) Administered   Influenza-Unspecified 04/28/2011, 06/14/2012   Moderna Sars-Covid-2 Vaccination 09/08/2019, 10/06/2019, 06/11/2020   Tdap 09/03/2013, 11/15/2016   Zoster Recombinant(Shingrix) 12/08/2022, 03/10/2023   Pertinent  Health Maintenance Due  Topic Date Due   Colonoscopy  10/10/2024 (Originally 07/09/2006)   OPHTHALMOLOGY EXAM  10/22/2024 (Originally 07[birth year]10/1971)   FOOT EXAM  04/16/2024   HEMOGLOBIN A1C  05/31/2024   MAMMOGRAM  11/29/2024   INFLUENZA VACCINE  Discontinued      02/09/2022    1:51 AM 03/01/2022    7:43 AM 05/28/2022    2:13 AM 12/07/2022    2:19 PM 12/27/2023   11:28 AM  Fall Risk  Falls in the past year?    Exclusion - non ambulatory 0  (RETIRED) Patient Fall Risk Level Moderate fall  risk  Moderate fall risk  Moderate fall risk        Data saved with a previous flowsheet row definition   Functional Status Survey:    Vitals:   02/11/24 1425 02/11/24 1440  BP: (!) 159/96 (!) 141/84  Pulse: 69   Resp: 20   Temp: (!) 97.1 F (36.2 C)   SpO2: 95%   Weight: 176 lb (79.8 kg)   Height: 5' 1 (1.549 m)    Body mass index is 33.25 kg/m. Physical Exam CHEST: Lungs clear to auscultation bilaterally. CARDIOVASCULAR: Heart regular rate and rhythm. Labs reviewed: Recent Labs    10/04/23 0832 11/05/23 0000 11/11/23 1336 11/29/23 0000 12/25/23 0000 01/28/24 0000 02/07/24 0000  NA 135   < > 130*   < > 132* 133* 136*  K 4.1   < > 4.1   < > 4.3 4.0 4.5  CL 97*   < > 94*   < > 96* 96* 98*  CO2 26   < > 24   < > 26* 30* 23*  GLUCOSE 152*  --  155*  --   --   --   --   BUN <5*   < > <5*   < > 3* 3* 4  CREATININE 0.71   < > 0.73   < > 0.7 0.8 0.8  CALCIUM  9.6   < > 9.0   < > 8.8 8.8 9.2   < > = values in this interval not displayed.   Recent Labs    06/04/23 0000 11/08/23 0000 11/11/23 1336  AST 10* 25 16  ALT 7 10 12   ALKPHOS 91 94 98  BILITOT  --   --  0.7   PROT  --   --  7.4  ALBUMIN 3.8 3.9 4.0   Recent Labs    10/04/23 0832 11/05/23 0000 11/08/23 0000 11/11/23 1336 11/29/23 0000 02/07/24 0000  WBC 10.3   < > 9.3 13.4* 9.9 9.1  NEUTROABS  --   --  6,222.00  --  7,554.00 7,353.00  HGB 11.1*   < > 10.7* 10.2* 12.0 14.2  HCT 36.6   < > 35* 31.8* 39 44  MCV 71.9*  --   --  68.7*  --   --   PLT 435*   < > 489* 572* 461* 276   < > = values in this interval not displayed.   Lab Results  Component Value Date   TSH 2.44 06/04/2023   Lab Results  Component Value Date   HGBA1C 6.4 11/29/2023   Lab Results  Component Value Date   CHOL 207 (A) 11/29/2023   HDL 59 11/29/2023   LDLCALC 117 11/29/2023   TRIG 185 (A) 11/29/2023   CHOLHDL 5.3 01/26/2020    Significant Diagnostic Results in last 30 days:  No results found.  Assessment/Plan Coronary artery disease with stents Intermittent chest pain last week with elevated troponin levels collected a week after the episode. History of six stents. Prefers monitoring and medication management over surgical or invasive interventions. Lungs are clear and heart has regular rate and rhythm. No current chest pain reported. - Increase dosage of long-acting nitroglycerin  (Imdur). - Provide nitroglycerin  for acute chest pain episodes. - Avoid surgical or invasive interventions as per patient's wishes.   Family/ staff Communication: nursing to notify family  Labs/tests ordered:  none

## 2024-02-14 ENCOUNTER — Ambulatory Visit: Admitting: Physician Assistant

## 2024-02-21 ENCOUNTER — Other Ambulatory Visit: Payer: Self-pay | Admitting: Nurse Practitioner

## 2024-02-21 DIAGNOSIS — Z79899 Other long term (current) drug therapy: Secondary | ICD-10-CM

## 2024-03-03 LAB — BASIC METABOLIC PANEL WITH GFR
BUN: 6 (ref 4–21)
CO2: 32 — AB (ref 13–22)
Chloride: 97 — AB (ref 99–108)
Creatinine: 0.7 (ref 0.5–1.1)
Glucose: 103
Potassium: 4.3 meq/L (ref 3.5–5.1)
Sodium: 136 — AB (ref 137–147)

## 2024-03-03 LAB — COMPREHENSIVE METABOLIC PANEL WITH GFR
Calcium: 8.8 (ref 8.7–10.7)
eGFR: 91

## 2024-03-03 LAB — CBC AND DIFFERENTIAL
HCT: 40 (ref 36–46)
Hemoglobin: 13.1 (ref 12.0–16.0)
Neutrophils Absolute: 8007
Platelets: 348 K/uL (ref 150–400)
WBC: 10.2

## 2024-03-03 LAB — CBC: RBC: 4.71 (ref 3.87–5.11)

## 2024-03-10 ENCOUNTER — Ambulatory Visit (INDEPENDENT_AMBULATORY_CARE_PROVIDER_SITE_OTHER): Admitting: Physician Assistant

## 2024-03-10 DIAGNOSIS — Z435 Encounter for attention to cystostomy: Secondary | ICD-10-CM | POA: Diagnosis not present

## 2024-03-10 NOTE — Progress Notes (Signed)
 Suprapubic Cath Change  Patient is present today for a suprapubic catheter change due to urinary retention.  8ml of water was drained from the balloon, a 18FR Silastic foley cath was removed from the tract with out difficulty.  Site was cleaned and prepped in a sterile fashion with betadine.  A 18FR Silastic foley cath was replaced into the tract no complications were noted. Urine return was noted, 10 ml of sterile water was inflated into the balloon and a night bag was attached for drainage.  Patient tolerated well.     Performed by: Dawnelle Warman, PA-C   Follow up: Return in about 4 weeks (around 04/07/2024) for SPT exchange.

## 2024-03-11 ENCOUNTER — Non-Acute Institutional Stay (SKILLED_NURSING_FACILITY): Payer: Self-pay | Admitting: Nurse Practitioner

## 2024-03-11 ENCOUNTER — Encounter: Payer: Self-pay | Admitting: Nurse Practitioner

## 2024-03-11 DIAGNOSIS — I25110 Atherosclerotic heart disease of native coronary artery with unstable angina pectoris: Secondary | ICD-10-CM

## 2024-03-11 DIAGNOSIS — E039 Hypothyroidism, unspecified: Secondary | ICD-10-CM

## 2024-03-11 DIAGNOSIS — I5020 Unspecified systolic (congestive) heart failure: Secondary | ICD-10-CM | POA: Diagnosis not present

## 2024-03-11 DIAGNOSIS — I10 Essential (primary) hypertension: Secondary | ICD-10-CM | POA: Diagnosis not present

## 2024-03-11 DIAGNOSIS — K219 Gastro-esophageal reflux disease without esophagitis: Secondary | ICD-10-CM

## 2024-03-11 DIAGNOSIS — Z9359 Other cystostomy status: Secondary | ICD-10-CM

## 2024-03-11 DIAGNOSIS — E782 Mixed hyperlipidemia: Secondary | ICD-10-CM

## 2024-03-11 DIAGNOSIS — F22 Delusional disorders: Secondary | ICD-10-CM

## 2024-03-11 DIAGNOSIS — I69354 Hemiplegia and hemiparesis following cerebral infarction affecting left non-dominant side: Secondary | ICD-10-CM | POA: Diagnosis not present

## 2024-03-11 NOTE — Assessment & Plan Note (Signed)
 Overall better control on current regimen. Tolerating norvasc 2.5 mg daily with imdur 60 mg and losartan  100 mg daily.

## 2024-03-11 NOTE — Assessment & Plan Note (Signed)
 Stable at this time, continues on Zyprexa  and scheduled valium 

## 2024-03-11 NOTE — Assessment & Plan Note (Addendum)
 stable on prevacid BID

## 2024-03-11 NOTE — Assessment & Plan Note (Signed)
 Continues on ASA daily with statin. No worsening of symptoms.

## 2024-03-11 NOTE — Progress Notes (Signed)
 Location:  Other [facility].  Nursing Home Room Number: [facility] [id number] Place of Service:  SNF [phone]) Victoria Medina  PCP: Abdul Fine, MD  Patient Care Team: Abdul Fine, MD as PCP - General (Family Medicine) [name], MD as PCP - Cardiology (Cardiology)  Extended Emergency Contact Information Primary Emergency Contact: [name] (POA),[name]  [place] Home Phone: [phone] Mobile Phone: [phone] Relation: Sister Secondary Emergency Contact: [name] Address: [id number]          [place], [place] [address] [place] Home Phone: [phone] Relation: Mother  Goals of care: Advanced Directive information    03/11/2024    3:25 PM  Advanced Directives  Does Patient Have a Medical Advance Directive? Yes  Type of Advance Directive Out of facility DNR (pink MOST or yellow form)  Does patient want to make changes to medical advance directive? No - Patient declined     Chief Complaint  Patient presents with   Medical Management of Chronic Issues    Medical Management of Chronic Issues.     HPI:  Pt is a 62 y.o. female seen today for medical management of chronic disease. Pt with hx of CVA with left sided hemiplegia, ischemic cardiomyopathy, MI, CAD with ongoing chest pains, depression, htn, hyperlipidemia.  She has suprapubic followed by urology with monthly catheter change which has been draining well.   She is now followed by cardiology due to CAD  In the past she did now wish to have interventions only medical management. No chest pains noted at this time.   Blood pressures have been better controlled and chest pain minimally reported at this time.   Overall mood has been stable.   Hx of CVA with left sided hemiparesis reports chronic facial numbness with left sided droop. She also has decrease in sensation to her left side   Past Medical History:  Diagnosis Date   Acute ST elevation  myocardial infarction (STEMI) of inferolateral wall (HCC) 01/25/2020   Blind    Cholecystitis 04/22/2021   History of migraine headaches    Hyperlipemia    Myocardial infarction [facility])    Stroke [facility])    Past Surgical History:  Procedure Laterality Date   ABDOMINAL HYSTERECTOMY     BACK SURGERY     CARDIAC CATHETERIZATION     CORONARY/GRAFT ACUTE MI REVASCULARIZATION N/A 01/25/2020   Procedure: Coronary/Graft Acute MI Revascularization;  Surgeon: [name], MD;  Location: [facility] INVASIVE CV LAB;  Service: Cardiovascular;  Laterality: N/A;   FRACTURE SURGERY     IR CATHETER TUBE CHANGE  08/10/2021   LEFT HEART CATH AND CORONARY ANGIOGRAPHY N/A 01/25/2020   Procedure: LEFT HEART CATH AND CORONARY ANGIOGRAPHY;  Surgeon: [name], MD;  Location: [facility] INVASIVE CV LAB;  Service: Cardiovascular;  Laterality: N/A;   SKIN GRAFT Right    TOTAL ABDOMINAL HYSTERECTOMY Bilateral     Allergies  Allergen Reactions   Levaquin [Levofloxacin In D5w] Anaphylaxis and Swelling   Iodinated Contrast Media Itching    Severe itching   Other     Dust mites and opiates (all of them)   Latex Dermatitis    Outpatient Encounter Medications as of 03/11/2024  Medication Sig   amLODipine (NORVASC) 2.5 MG tablet Take 2.5 mg by mouth daily.   aspirin  EC 81 MG tablet Take 81 mg by mouth daily.   Azelastine  HCl 0.15 % SOLN Place 2 sprays into both nostrils 2 (two) times daily.  Cholecalciferol (VITAMIN D3) 1.25 MG (50000 UT) CAPS Take 1 capsule by mouth once a week.  every Mon for Supplement   Dextromethorphan-Benzocaine (COUGH/SORE THROAT LOZENGES MT) Use as directed 1 drop in the mouth or throat every 4 (four) hours as needed.   diazepam  (VALIUM ) 10 MG tablet TAKE 1 TABLET BY MOUTH FOUR TIMES A DAY   ibuprofen  (ADVIL ) 800 MG tablet Take 800 mg by mouth every 8 (eight) hours as needed for mild pain or moderate pain. (Patient taking differently: Take 800 mg by mouth every 12 (twelve) hours as needed  for mild pain (pain score 1-3) or moderate pain (pain score 4-6).)   isosorbide mononitrate (IMDUR) 60 MG 24 hr tablet Take 60 mg by mouth daily.   lansoprazole (PREVACID) 15 MG capsule Take 15 mg by mouth 2 (two) times daily before a meal.   levothyroxine  (SYNTHROID ) 25 MCG tablet Take 25 mcg by mouth daily before breakfast.   losartan  (COZAAR ) 100 MG tablet Take 100 mg by mouth daily.   magnesium  hydroxide (MILK OF MAGNESIA) 400 MG/5ML suspension Take 5 mLs by mouth every 6 (six) hours as needed for mild constipation.   nitroGLYCERIN  (NITROSTAT ) 0.4 MG SL tablet Place 0.4 mg under the tongue every 5 (five) minutes x 3 doses as needed for chest pain.    nystatin  powder Apply 1 Application topically 3 (three) times daily.   OLANZapine  (ZYPREXA ) 5 MG tablet Take 5 mg by mouth daily.   ondansetron  (ZOFRAN -ODT) 4 MG disintegrating tablet Take 4 mg by mouth every 4 (four) hours as needed for nausea or vomiting.   polyethylene glycol (MIRALAX  / GLYCOLAX ) 17 g packet Take 17 g by mouth daily.   promethazine  (PHENERGAN ) 12.5 MG suppository Place 1 suppository (12.5 mg total) rectally every 6 (six) hours as needed for nausea or vomiting.   ranolazine (RANEXA) 500 MG 12 hr tablet Take 500 mg by mouth 2 (two) times daily.   rosuvastatin (CRESTOR) 5 MG tablet Take 5 mg by mouth daily. Every Tuesday,Thursday,Sat   senna-docusate (SENOKOT-S) 8.6-50 MG tablet Take 2 tablets by mouth at bedtime.   Vibegron  (GEMTESA ) 75 MG TABS Take 75 mg by mouth daily.   alum & mag hydroxide-simeth (MAALOX PLUS) 400-400-40 MG/5ML suspension Take 15 mLs by mouth every 4 (four) hours as needed for indigestion. (Patient not taking: Reported on 03/11/2024)   ferrous sulfate 325 (65 FE) MG EC tablet Take 325 mg by mouth. Every Monday, Wednesday and Friday. (Patient not taking: Reported on 03/11/2024)   sertraline (ZOLOFT) 100 MG tablet Take 50 mg by mouth daily. (Patient not taking: Reported on 03/11/2024)   No facility-administered  encounter medications on file as of 03/11/2024.    Review of Systems  Constitutional:  Negative for activity change, appetite change, fatigue and unexpected weight change.  HENT:  Negative for congestion and hearing loss.   Eyes: Negative.   Respiratory:  Negative for cough and shortness of breath.   Cardiovascular:  Negative for chest pain, palpitations and leg swelling.  Gastrointestinal:  Positive for constipation. Negative for abdominal pain and diarrhea.  Genitourinary:  Negative for difficulty urinating and dysuria.  Musculoskeletal:  Positive for arthralgias and gait problem. Negative for myalgias.  Skin:  Negative for color change and wound.  Neurological:  Positive for weakness. Negative for dizziness.  Psychiatric/Behavioral:  Negative for agitation, behavioral problems and confusion.      Immunization History  Administered Date(s) Administered   Influenza-Unspecified 04/28/2011, 06/14/2012   Moderna Sars-Covid-2 Vaccination 09/08/2019, 10/06/2019,  06/11/2020   Tdap 09/03/2013, 11/15/2016   Zoster Recombinant(Shingrix) 12/08/2022, 03/10/2023   Pertinent  Health Maintenance Due  Topic Date Due   Colonoscopy  10/10/2024 (Originally 07/09/2006)   OPHTHALMOLOGY EXAM  10/22/2024 (Originally [birth year]10/1971)   FOOT EXAM  04/16/2024   HEMOGLOBIN A1C  05/31/2024   MAMMOGRAM  11/29/2024   INFLUENZA VACCINE  Discontinued      02/09/2022    1:51 AM 03/01/2022    7:43 AM 05/28/2022    2:13 AM 12/07/2022    2:19 PM 12/27/2023   11:28 AM  Fall Risk  Falls in the past year?    Exclusion - non ambulatory 0  (RETIRED) Patient Fall Risk Level Moderate fall risk  Moderate fall risk  Moderate fall risk        Data saved with a previous flowsheet row definition   Functional Status Survey:    Vitals:   03/11/24 1516 03/11/24 1525  BP: (!) 140/78 (!) 140/78  Pulse: 75   Resp: 18   Temp: 97.8 F (36.6 C)   SpO2: 96%   Weight: 178 lb 12.8 oz (81.1 kg)   Height: 5' 1 (1.549 m)     Body mass index is 33.78 kg/m. Physical Exam Constitutional:      General: She is not in acute distress.    Appearance: She is well-developed. She is not diaphoretic.  HENT:     Head: Normocephalic and atraumatic.     Mouth/Throat:     Pharynx: No oropharyngeal exudate.  Eyes:     Conjunctiva/sclera: Conjunctivae normal.     Pupils: Pupils are equal, round, and reactive to light.  Cardiovascular:     Rate and Rhythm: Normal rate and regular rhythm.     Heart sounds: Normal heart sounds.  Pulmonary:     Effort: Pulmonary effort is normal.     Breath sounds: Normal breath sounds.  Abdominal:     General: Bowel sounds are normal.     Palpations: Abdomen is soft.  Musculoskeletal:     Cervical back: Normal range of motion and neck supple.     Right lower leg: No edema.     Left lower leg: No edema.  Skin:    General: Skin is warm and dry.  Neurological:     Mental Status: She is alert. Mental status is at baseline.     Sensory: Sensory deficit present.     Motor: Weakness present.     Gait: Gait abnormal.     Comments: Left sided hemiparesis  With lef sided facial droop   Psychiatric:        Mood and Affect: Mood normal.    Labs reviewed: Recent Labs    10/04/23 0832 11/05/23 0000 11/11/23 1336 11/29/23 0000 01/28/24 0000 02/07/24 0000 03/03/24 0000  NA 135   < > 130*   < > 133* 136* 136*  K 4.1   < > 4.1   < > 4.0 4.5 4.3  CL 97*   < > 94*   < > 96* 98* 97*  CO2 26   < > 24   < > 30* 23* 32*  GLUCOSE 152*  --  155*  --   --   --   --   BUN <5*   < > <5*   < > 3* 4 6  CREATININE 0.71   < > 0.73   < > 0.8 0.8 0.7  CALCIUM  9.6   < > 9.0   < > 8.8 9.2  8.8   < > = values in this interval not displayed.   Recent Labs    06/04/23 0000 11/08/23 0000 11/11/23 1336  AST 10* 25 16  ALT 7 10 12   ALKPHOS 91 94 98  BILITOT  --   --  0.7  PROT  --   --  7.4  ALBUMIN 3.8 3.9 4.0   Recent Labs    10/04/23 0832 11/05/23 0000 11/11/23 1336 11/29/23 0000  02/07/24 0000 03/03/24 0000  WBC 10.3   < > 13.4* 9.9 9.1 10.2  NEUTROABS  --    < >  --  7,554.00 7,353.00 8,007.00  HGB 11.1*   < > 10.2* 12.0 14.2 13.1  HCT 36.6   < > 31.8* 39 44 40  MCV 71.9*  --  68.7*  --   --   --   PLT 435*   < > 572* 461* 276 348   < > = values in this interval not displayed.   Lab Results  Component Value Date   TSH 2.44 06/04/2023   Lab Results  Component Value Date   HGBA1C 6.4 11/29/2023   Lab Results  Component Value Date   CHOL 207 (A) 11/29/2023   HDL 59 11/29/2023   LDLCALC 117 11/29/2023   TRIG 185 (A) 11/29/2023   CHOLHDL 5.3 01/26/2020    Significant Diagnostic Results in last 30 days:  No results found.  Assessment/Plan Suprapubic catheter (HCC) Stable, Continues with monthly change from urology  Psychosis (HCC) Stable at this time, continues on Zyprexa  and scheduled valium    Mixed hyperlipidemia Continues on crestor without adverse effects.   Hypothyroidism TSH at goal on synthroid  25 mcg.   HFrEF (heart failure with reduced ejection fraction) (HCC) Stable, followed by cardiology at this time. Euvolemic.   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 No worsening of symptoms.   Essential hypertension Overall better control on current regimen. Tolerating norvasc 2.5 mg daily with imdur 60 mg and losartan  100 mg daily.   Coronary artery disease involving native coronary artery of native heart with unstable angina pectoris (HCC) Stable at this time. Does not wish for aggressive interventions. Medication only and did not tolerate betablocker in the past.  Continues on imdur and ranexa      Victoria Medina [facility] [phone]

## 2024-03-11 NOTE — Assessment & Plan Note (Signed)
 Stable, Continues with monthly change from urology

## 2024-03-11 NOTE — Assessment & Plan Note (Signed)
 Continues on crestor without adverse effects.

## 2024-03-11 NOTE — Assessment & Plan Note (Signed)
 TSH at goal on synthroid  25 mcg.

## 2024-03-11 NOTE — Assessment & Plan Note (Signed)
 Stable at this time. Does not wish for aggressive interventions. Medication only and did not tolerate betablocker in the past.  Continues on imdur and ranexa

## 2024-03-11 NOTE — Assessment & Plan Note (Signed)
 Stable, followed by cardiology at this time. Euvolemic.

## 2024-04-10 ENCOUNTER — Ambulatory Visit (INDEPENDENT_AMBULATORY_CARE_PROVIDER_SITE_OTHER): Admitting: Physician Assistant

## 2024-04-10 DIAGNOSIS — Z435 Encounter for attention to cystostomy: Secondary | ICD-10-CM | POA: Diagnosis not present

## 2024-04-10 NOTE — Progress Notes (Signed)
 Suprapubic Cath Change  Patient is present today for a suprapubic catheter change due to urinary retention.  8ml of water was drained from the balloon, a 18FR Silastic foley cath was removed from the tract without difficulty.  Site was cleaned and prepped in a sterile fashion with betadine.  A 18FR Silastic foley cath was replaced into the tract no complications were noted. Urine return was noted, 10 ml of sterile water was inflated into the balloon and a night bag was attached for drainage.  Patient tolerated well.     Performed by: Abia Monaco, PA-C and Laymon Ned, CMA  Follow up: Return in about 4 weeks (around 05/08/2024) for SPT exchange.

## 2024-05-12 ENCOUNTER — Ambulatory Visit: Admitting: Physician Assistant

## 2024-05-13 ENCOUNTER — Ambulatory Visit (INDEPENDENT_AMBULATORY_CARE_PROVIDER_SITE_OTHER): Admitting: Physician Assistant

## 2024-05-13 DIAGNOSIS — Z435 Encounter for attention to cystostomy: Secondary | ICD-10-CM

## 2024-05-13 NOTE — Progress Notes (Signed)
 Suprapubic Cath Change  Patient is present today for a suprapubic catheter change due to urinary retention.  8ml of water was drained from the balloon, a 18FR foley cath was removed from the tract without difficulty.  Site was cleaned and prepped in a sterile fashion with betadine.  A 18FR Silastic foley cath was replaced into the tract no complications were noted. Urine return was noted, 10 ml of sterile water was inflated into the balloon and a night bag was attached for drainage.  Patient tolerated well.   Performed by: Carletta Feasel, PA-C and Laymon Ned, CMA  Additional notes: She reports several months of occasional (~1/week) bladder spasms. She is already on Gemtesa  and has constipation at baseline. She would like to defer additional pharmacotherapy due to concerns for worsening anticholinergic side effects, which is reasonable. Will continue Gemtesa .  Follow up: Return in about 4 weeks (around 06/10/2024) for SPT exchange.

## 2024-05-21 ENCOUNTER — Encounter: Payer: Self-pay | Admitting: Orthopedic Surgery

## 2024-05-21 ENCOUNTER — Non-Acute Institutional Stay (SKILLED_NURSING_FACILITY): Payer: Self-pay | Admitting: Orthopedic Surgery

## 2024-05-21 DIAGNOSIS — K219 Gastro-esophageal reflux disease without esophagitis: Secondary | ICD-10-CM

## 2024-05-21 DIAGNOSIS — E782 Mixed hyperlipidemia: Secondary | ICD-10-CM

## 2024-05-21 DIAGNOSIS — I25118 Atherosclerotic heart disease of native coronary artery with other forms of angina pectoris: Secondary | ICD-10-CM

## 2024-05-21 DIAGNOSIS — I69354 Hemiplegia and hemiparesis following cerebral infarction affecting left non-dominant side: Secondary | ICD-10-CM

## 2024-05-21 DIAGNOSIS — I10 Essential (primary) hypertension: Secondary | ICD-10-CM

## 2024-05-21 DIAGNOSIS — Z9359 Other cystostomy status: Secondary | ICD-10-CM

## 2024-05-21 DIAGNOSIS — I5020 Unspecified systolic (congestive) heart failure: Secondary | ICD-10-CM | POA: Diagnosis not present

## 2024-05-21 DIAGNOSIS — E039 Hypothyroidism, unspecified: Secondary | ICD-10-CM

## 2024-05-21 DIAGNOSIS — F22 Delusional disorders: Secondary | ICD-10-CM

## 2024-05-21 NOTE — Progress Notes (Signed)
 Location:  Other [place].  Nursing Home Room Number: [facility] [id number] Place of Service:  SNF [phone]) Provider:  Greig Cluster, NP  [name], DO  Patient Care Team: [name], DO as PCP - General (Internal Medicine) [name], MD as PCP - Cardiology (Cardiology)  Extended Emergency Contact Information Primary Emergency Contact: Jacqlyn, [place]  [place] Home Phone: [phone] Mobile Phone: [phone] Relation: Sister Secondary Emergency Contact: [name] Address: [id number]          [place], [place] [address] [place] Home Phone: [phone] Relation: Mother  Code Status:  DNR Goals of care: Advanced Directive information    05/21/2024   10:21 AM  Advanced Directives  Does Patient Have a Medical Advance Directive? Yes  Type of Advance Directive Out of facility DNR (pink MOST or yellow form)  Does patient want to make changes to medical advance directive? No - Patient declined     Chief Complaint  Patient presents with   Medical Management of Chronic Issues    Medical Management of Chronic Issues.     HPI:  Pt is a 63 y.o. female seen today for medical management of chronic diseases.    She currently resides on the skilled nursing unit at [facility]. PMH: NSTEMI, CAD, DVT, PE, HTN, HFrEF, migraines, OSA, GERD, hypothyroidism, stroke with hemiplegia, myalgias, cervical intraepithelial glandular neoplasia, obesity, suprapubic catheter, anxiety and psychosis.   HTN- BUN/creat 6/0.7 03/03/2024, see trends below, remains on amlodipine and losartan  HLD- LDL 117 11/29/2023, remains on rosuvastatin CAD- remains on asa isosorbide and ranexa, did not tolerate beta blocker in past HF- LVEF 40-45% 12/2019,not on diuretics, followed by cardiology H/o CVA with Hemiplegia- CVA 2015, blind, left sided paralysis, muscle spasms, CT head noted old bilateral PCA distribution infarcts 08/2022, remains on asa and statin and  valium  Hypothyroidism- TSH 2.4411/2024, remains on levothyroxine  Suprapubic catheter- monthly changes with urology Delusional disorder- no changes in mood, remains on Zyprexa  GERD- hgb 13.1 03/03/2024, remains on lansoprazole Obesity- BMI 34.39  Recent blood pressures:  10/22- 122/79, 134/88  10/20- 115/73  Recent weights:  10/02- 182 lbs  08/06- 178.8 lbs  07/03- 176.0 lbs     Past Medical History:  Diagnosis Date   Acute ST elevation myocardial infarction (STEMI) of inferolateral wall (HCC) 01/25/2020   Blind    Cholecystitis 04/22/2021   History of migraine headaches    Hyperlipemia    Myocardial infarction (HCC)    Stroke Sc[facility]x)    Past Surgical History:  Procedure Laterality Date   ABDOMINAL HYSTERECTOMY     BACK SURGERY     CARDIAC CATHETERIZATION     CORONARY/GRAFT ACUTE MI REVASCULARIZATION N/A 01/25/2020   Procedure: Coronary/Graft Acute MI Revascularization;  Surgeon: [name], MD;  Location: [facility] INVASIVE CV LAB;  Service: Cardiovascular;  Laterality: N/A;   FRACTURE SURGERY     IR CATHETER TUBE CHANGE  08/10/2021   LEFT HEART CATH AND CORONARY ANGIOGRAPHY N/A 01/25/2020   Procedure: LEFT HEART CATH AND CORONARY ANGIOGRAPHY;  Surgeon: [name], MD;  Location: [facility] INVASIVE CV LAB;  Service: Cardiovascular;  Laterality: N/A;   SKIN GRAFT Right    TOTAL ABDOMINAL HYSTERECTOMY Bilateral     Allergies  Allergen Reactions   Levaquin [Levofloxacin In D5w] Anaphylaxis and Swelling   Iodinated Contrast Media Itching    Severe itching   Other     Dust mites and opiates (all of them)   Latex Dermatitis  Outpatient Encounter Medications as of 05/21/2024  Medication Sig   amLODipine (NORVASC) 2.5 MG tablet Take 2.5 mg by mouth daily.   aspirin  EC 81 MG tablet Take 81 mg by mouth daily.   Azelastine  HCl 0.15 % SOLN Place 2 sprays into both nostrils 2 (two) times daily.   Cholecalciferol (VITAMIN D3) 1.25 MG (50000 UT) CAPS Take 1 capsule by  mouth once a week.  every Mon for Supplement   Dextromethorphan-Benzocaine (COUGH/SORE THROAT LOZENGES MT) Use as directed 1 drop in the mouth or throat every 4 (four) hours as needed.   diazepam  (VALIUM ) 10 MG tablet TAKE 1 TABLET BY MOUTH FOUR TIMES A DAY   ibuprofen  (ADVIL ) 800 MG tablet Take 800 mg by mouth 2 (two) times daily as needed for mild pain (pain score 1-3) or moderate pain (pain score 4-6).   isosorbide mononitrate (IMDUR) 60 MG 24 hr tablet Take 60 mg by mouth daily.   lansoprazole (PREVACID) 15 MG capsule Take 15 mg by mouth 2 (two) times daily before a meal.   levothyroxine  (SYNTHROID ) 25 MCG tablet Take 25 mcg by mouth daily before breakfast.   losartan  (COZAAR ) 100 MG tablet Take 100 mg by mouth daily.   magnesium  hydroxide (MILK OF MAGNESIA) 400 MG/5ML suspension Take 5 mLs by mouth every 6 (six) hours as needed for mild constipation.   nitroGLYCERIN  (NITROSTAT ) 0.4 MG SL tablet Place 0.4 mg under the tongue every 5 (five) minutes x 3 doses as needed for chest pain.    nystatin  powder Apply 1 Application topically 3 (three) times daily.   OLANZapine  (ZYPREXA ) 5 MG tablet Take 5 mg by mouth daily.   ondansetron  (ZOFRAN -ODT) 4 MG disintegrating tablet Take 4 mg by mouth every 4 (four) hours as needed for nausea or vomiting.   polyethylene glycol (MIRALAX  / GLYCOLAX ) 17 g packet Take 17 g by mouth daily.   promethazine  (PHENERGAN ) 12.5 MG suppository Place 1 suppository (12.5 mg total) rectally every 6 (six) hours as needed for nausea or vomiting.   ranolazine (RANEXA) 500 MG 12 hr tablet Take 500 mg by mouth 2 (two) times daily.   rosuvastatin (CRESTOR) 5 MG tablet Take 5 mg by mouth daily. Every Tuesday,Thursday,Sat   senna-docusate (SENOKOT-S) 8.6-50 MG tablet Take 2 tablets by mouth at bedtime.   Vibegron  (GEMTESA ) 75 MG TABS Take 75 mg by mouth daily.   No facility-administered encounter medications on file as of 05/21/2024.    Review of Systems  Constitutional:  Negative.   HENT: Negative.    Eyes:  Positive for visual disturbance.  Respiratory: Negative.    Cardiovascular: Negative.   Gastrointestinal:  Positive for constipation.  Genitourinary:  Negative for hematuria and vaginal bleeding.  Musculoskeletal:  Positive for gait problem and myalgias.  Skin:  Negative for wound.  Neurological:  Positive for weakness.  Psychiatric/Behavioral:  Positive for behavioral problems. The patient is nervous/anxious.     Immunization History  Administered Date(s) Administered   Influenza-Unspecified 04/28/2011, 06/14/2012   Moderna Sars-Covid-2 Vaccination 09/08/2019, 10/06/2019, 06/11/2020   Tdap 09/03/2013, 11/15/2016   Zoster Recombinant(Shingrix) 12/08/2022, 03/10/2023   Pertinent  Health Maintenance Due  Topic Date Due   FOOT EXAM  04/16/2024   Colonoscopy  10/10/2024 (Originally 07/09/2006)   OPHTHALMOLOGY EXAM  10/22/2024 (Originally 07/10/1971)   HEMOGLOBIN A1C  05/31/2024   Mammogram  11/29/2024   Influenza Vaccine  Discontinued      02/09/2022    1:51 AM 03/01/2022    7:43 AM 05/28/2022  2:13 AM 12/07/2022    2:19 PM 12/27/2023   11:28 AM  Fall Risk  Falls in the past year?    Exclusion - non ambulatory 0  (RETIRED) Patient Fall Risk Level Moderate fall risk  Moderate fall risk  Moderate fall risk        Data saved with a previous flowsheet row definition   Functional Status Survey:    Vitals:   05/21/24 1008  BP: 115/73  Pulse: 84  Resp: 16  Temp: 97.6 F (36.4 C)  SpO2: 95%  Weight: 182 lb (82.6 kg)  Height: 5' 1 (1.549 m)   Body mass index is 34.39 kg/m. Physical Exam Vitals reviewed.  Constitutional:      Appearance: She is obese.  HENT:     Head: Normocephalic.     Right Ear: There is no impacted cerumen.     Left Ear: There is no impacted cerumen.     Nose: Nose normal.     Mouth/Throat:     Mouth: Mucous membranes are moist.  Eyes:     General:        Right eye: No discharge.        Left eye: No  discharge.  Cardiovascular:     Rate and Rhythm: Normal rate and regular rhythm.     Pulses: Normal pulses.     Heart sounds: Normal heart sounds.  Pulmonary:     Effort: Pulmonary effort is normal.     Breath sounds: Normal breath sounds.  Abdominal:     General: Bowel sounds are normal. There is no distension.     Palpations: Abdomen is soft.     Tenderness: There is no abdominal tenderness.     Comments: Suprapubic catheter present  Musculoskeletal:     Cervical back: Neck supple.     Right lower leg: No edema.     Left lower leg: No edema.     Comments: Left hand/wrist contracture  Skin:    General: Skin is warm.     Capillary Refill: Capillary refill takes less than 2 seconds.  Neurological:     General: No focal deficit present.     Mental Status: She is alert. Mental status is at baseline.     Motor: Weakness present.     Gait: Gait abnormal.     Comments: Left sided paralysis  Psychiatric:        Mood and Affect: Mood normal.     Labs reviewed: Recent Labs    10/04/23 0832 11/05/23 0000 11/11/23 1336 11/29/23 0000 01/28/24 0000 02/07/24 0000 03/03/24 0000  NA 135   < > 130*   < > 133* 136* 136*  K 4.1   < > 4.1   < > 4.0 4.5 4.3  CL 97*   < > 94*   < > 96* 98* 97*  CO2 26   < > 24   < > 30* 23* 32*  GLUCOSE 152*  --  155*  --   --   --   --   BUN <5*   < > <5*   < > 3* 4 6  CREATININE 0.71   < > 0.73   < > 0.8 0.8 0.7  CALCIUM  9.6   < > 9.0   < > 8.8 9.2 8.8   < > = values in this interval not displayed.   Recent Labs    06/04/23 0000 11/08/23 0000 11/11/23 1336  AST 10* 25 16  ALT 7  10 12  ALKPHOS 91 94 98  BILITOT  --   --  0.7  PROT  --   --  7.4  ALBUMIN 3.8 3.9 4.0   Recent Labs    10/04/23 0832 11/05/23 0000 11/11/23 1336 11/29/23 0000 02/07/24 0000 03/03/24 0000  WBC 10.3   < > 13.4* 9.9 9.1 10.2  NEUTROABS  --    < >  --  7,554.00 7,353.00 8,007.00  HGB 11.1*   < > 10.2* 12.0 14.2 13.1  HCT 36.6   < > 31.8* 39 44 40  MCV  71.9*  --  68.7*  --   --   --   PLT 435*   < > 572* 461* 276 348   < > = values in this interval not displayed.   Lab Results  Component Value Date   TSH 2.44 06/04/2023   Lab Results  Component Value Date   HGBA1C 6.4 11/29/2023   Lab Results  Component Value Date   CHOL 207 (A) 11/29/2023   HDL 59 11/29/2023   LDLCALC 117 11/29/2023   TRIG 185 (A) 11/29/2023   CHOLHDL 5.3 01/26/2020    Significant Diagnostic Results in last 30 days:  No results found.  Assessment/Plan 1. Essential hypertension (Primary) - controlled with amlodipine and losartan   2. Mixed hyperlipidemia - cont rousuvastatin  3. Coronary artery disease of native artery of native heart with stable angina pectoris - stable with asa, isosorbide and ranexa - did not tolerate beta blockers in past  4. HFrEF (heart failure with reduced ejection fraction) (HCC) - LVEF 40-45% 12/2019 - compensated  5. 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 Acquired hypothyroidism - TSH stable with levothyroxine   7. Suprapubic catheter (HCC) - cont monthly changes with urology  8. Delusional disorder (HCC) - no mood changes - supportive family  - cont Zyprexa   9. Gastroesophageal reflux disease without esophagitis - hgb stable - cont lansoprazole  10. Obesity, morbid (HCC) - BMI 34.39 - sedentary lifestyle due to past CVA    Family/ staff Communication: plan discussed with patient and nurse  Labs/tests ordered:  none

## 2024-05-23 ENCOUNTER — Other Ambulatory Visit: Payer: Self-pay | Admitting: Orthopedic Surgery

## 2024-05-23 ENCOUNTER — Other Ambulatory Visit: Payer: Self-pay | Admitting: Nurse Practitioner

## 2024-05-23 DIAGNOSIS — Z79899 Other long term (current) drug therapy: Secondary | ICD-10-CM

## 2024-05-23 NOTE — Telephone Encounter (Signed)
 Patient is requesting a refill of the following medications: Requested Prescriptions   Pending Prescriptions Disp Refills   diazepam  (VALIUM ) 10 MG tablet [Pharmacy Med Name: diazePAM  10 MG Tablet] 120 tablet 0    Sig: TAKE 1 TABLET BY MOUTH FOUR TIMES A DAY    Date of last refill: 12/25/23  Refill amount: 120/5 additional refills   Treatment agreement date: Not on file an no upcoming appointments

## 2024-06-05 ENCOUNTER — Encounter: Payer: Self-pay | Admitting: Emergency Medicine

## 2024-06-05 ENCOUNTER — Inpatient Hospital Stay
Admission: EM | Admit: 2024-06-05 | Discharge: 2024-06-08 | DRG: 193 | Disposition: A | Discharge status: Skilled Nursing Facility | Source: Skilled Nursing Facility | Attending: Internal Medicine | Admitting: Internal Medicine

## 2024-06-05 ENCOUNTER — Emergency Department

## 2024-06-05 ENCOUNTER — Other Ambulatory Visit: Payer: Self-pay

## 2024-06-05 DIAGNOSIS — I69354 Hemiplegia and hemiparesis following cerebral infarction affecting left non-dominant side: Secondary | ICD-10-CM | POA: Diagnosis not present

## 2024-06-05 DIAGNOSIS — Z955 Presence of coronary angioplasty implant and graft: Secondary | ICD-10-CM

## 2024-06-05 DIAGNOSIS — K5900 Constipation, unspecified: Secondary | ICD-10-CM

## 2024-06-05 DIAGNOSIS — Z6835 Body mass index (BMI) 35.0-35.9, adult: Secondary | ICD-10-CM | POA: Diagnosis not present

## 2024-06-05 DIAGNOSIS — E782 Mixed hyperlipidemia: Secondary | ICD-10-CM | POA: Diagnosis present

## 2024-06-05 DIAGNOSIS — Z8249 Family history of ischemic heart disease and other diseases of the circulatory system: Secondary | ICD-10-CM

## 2024-06-05 DIAGNOSIS — Z809 Family history of malignant neoplasm, unspecified: Secondary | ICD-10-CM

## 2024-06-05 DIAGNOSIS — Z7982 Long term (current) use of aspirin: Secondary | ICD-10-CM | POA: Diagnosis not present

## 2024-06-05 DIAGNOSIS — J181 Lobar pneumonia, unspecified organism: Secondary | ICD-10-CM | POA: Diagnosis present

## 2024-06-05 DIAGNOSIS — F411 Generalized anxiety disorder: Secondary | ICD-10-CM | POA: Diagnosis present

## 2024-06-05 DIAGNOSIS — Z9359 Other cystostomy status: Secondary | ICD-10-CM | POA: Diagnosis not present

## 2024-06-05 DIAGNOSIS — E039 Hypothyroidism, unspecified: Secondary | ICD-10-CM | POA: Diagnosis present

## 2024-06-05 DIAGNOSIS — R0602 Shortness of breath: Secondary | ICD-10-CM | POA: Diagnosis present

## 2024-06-05 DIAGNOSIS — I252 Old myocardial infarction: Secondary | ICD-10-CM

## 2024-06-05 DIAGNOSIS — Z91041 Radiographic dye allergy status: Secondary | ICD-10-CM

## 2024-06-05 DIAGNOSIS — E66812 Obesity, class 2: Secondary | ICD-10-CM | POA: Diagnosis present

## 2024-06-05 DIAGNOSIS — Z8349 Family history of other endocrine, nutritional and metabolic diseases: Secondary | ICD-10-CM

## 2024-06-05 DIAGNOSIS — G4733 Obstructive sleep apnea (adult) (pediatric): Secondary | ICD-10-CM | POA: Diagnosis present

## 2024-06-05 DIAGNOSIS — I110 Hypertensive heart disease with heart failure: Secondary | ICD-10-CM | POA: Diagnosis present

## 2024-06-05 DIAGNOSIS — J9601 Acute respiratory failure with hypoxia: Secondary | ICD-10-CM | POA: Diagnosis present

## 2024-06-05 DIAGNOSIS — I10 Essential (primary) hypertension: Secondary | ICD-10-CM | POA: Diagnosis present

## 2024-06-05 DIAGNOSIS — Z90710 Acquired absence of both cervix and uterus: Secondary | ICD-10-CM

## 2024-06-05 DIAGNOSIS — E785 Hyperlipidemia, unspecified: Secondary | ICD-10-CM | POA: Diagnosis present

## 2024-06-05 DIAGNOSIS — H543 Unqualified visual loss, both eyes: Secondary | ICD-10-CM | POA: Diagnosis present

## 2024-06-05 DIAGNOSIS — Z79899 Other long term (current) drug therapy: Secondary | ICD-10-CM

## 2024-06-05 DIAGNOSIS — F331 Major depressive disorder, recurrent, moderate: Secondary | ICD-10-CM | POA: Diagnosis present

## 2024-06-05 DIAGNOSIS — Z833 Family history of diabetes mellitus: Secondary | ICD-10-CM

## 2024-06-05 DIAGNOSIS — K219 Gastro-esophageal reflux disease without esophagitis: Secondary | ICD-10-CM | POA: Diagnosis present

## 2024-06-05 DIAGNOSIS — Z91040 Latex allergy status: Secondary | ICD-10-CM

## 2024-06-05 DIAGNOSIS — I2510 Atherosclerotic heart disease of native coronary artery without angina pectoris: Secondary | ICD-10-CM | POA: Diagnosis present

## 2024-06-05 DIAGNOSIS — H47619 Cortical blindness, unspecified side of brain: Secondary | ICD-10-CM

## 2024-06-05 DIAGNOSIS — Z66 Do not resuscitate: Secondary | ICD-10-CM | POA: Diagnosis present

## 2024-06-05 DIAGNOSIS — I69359 Hemiplegia and hemiparesis following cerebral infarction affecting unspecified side: Secondary | ICD-10-CM

## 2024-06-05 DIAGNOSIS — I5031 Acute diastolic (congestive) heart failure: Secondary | ICD-10-CM | POA: Diagnosis present

## 2024-06-05 DIAGNOSIS — R339 Retention of urine, unspecified: Secondary | ICD-10-CM | POA: Diagnosis present

## 2024-06-05 DIAGNOSIS — Z79890 Hormone replacement therapy (postmenopausal): Secondary | ICD-10-CM

## 2024-06-05 DIAGNOSIS — Z881 Allergy status to other antibiotic agents status: Secondary | ICD-10-CM

## 2024-06-05 DIAGNOSIS — Z823 Family history of stroke: Secondary | ICD-10-CM

## 2024-06-05 DIAGNOSIS — Z8673 Personal history of transient ischemic attack (TIA), and cerebral infarction without residual deficits: Secondary | ICD-10-CM

## 2024-06-05 DIAGNOSIS — E871 Hypo-osmolality and hyponatremia: Secondary | ICD-10-CM | POA: Diagnosis present

## 2024-06-05 DIAGNOSIS — D72829 Elevated white blood cell count, unspecified: Secondary | ICD-10-CM | POA: Diagnosis present

## 2024-06-05 LAB — CBC WITH DIFFERENTIAL/PLATELET
Abs Immature Granulocytes: 0.09 K/uL — ABNORMAL HIGH (ref 0.00–0.07)
Basophils Absolute: 0.1 K/uL (ref 0.0–0.1)
Basophils Relative: 0 %
Eosinophils Absolute: 0.1 K/uL (ref 0.0–0.5)
Eosinophils Relative: 1 %
HCT: 40.3 % (ref 36.0–46.0)
Hemoglobin: 12.9 g/dL (ref 12.0–15.0)
Immature Granulocytes: 1 %
Lymphocytes Relative: 7 %
Lymphs Abs: 1 K/uL (ref 0.7–4.0)
MCH: 27 pg (ref 26.0–34.0)
MCHC: 32 g/dL (ref 30.0–36.0)
MCV: 84.5 fL (ref 80.0–100.0)
Monocytes Absolute: 0.8 K/uL (ref 0.1–1.0)
Monocytes Relative: 6 %
Neutro Abs: 12.5 K/uL — ABNORMAL HIGH (ref 1.7–7.7)
Neutrophils Relative %: 85 %
Platelets: 354 K/uL (ref 150–400)
RBC: 4.77 MIL/uL (ref 3.87–5.11)
RDW: 13.4 % (ref 11.5–15.5)
WBC: 14.5 K/uL — ABNORMAL HIGH (ref 4.0–10.5)
nRBC: 0 % (ref 0.0–0.2)

## 2024-06-05 LAB — COMPREHENSIVE METABOLIC PANEL WITH GFR
ALT: 8 U/L (ref 0–44)
AST: 18 U/L (ref 15–41)
Albumin: 3.6 g/dL (ref 3.5–5.0)
Alkaline Phosphatase: 83 U/L (ref 38–126)
Anion gap: 14 (ref 5–15)
BUN: 9 mg/dL (ref 8–23)
CO2: 21 mmol/L — ABNORMAL LOW (ref 22–32)
Calcium: 8.7 mg/dL — ABNORMAL LOW (ref 8.9–10.3)
Chloride: 96 mmol/L — ABNORMAL LOW (ref 98–111)
Creatinine, Ser: 0.78 mg/dL (ref 0.44–1.00)
GFR, Estimated: >60 mL/min (ref >60)
Glucose, Bld: 201 mg/dL — ABNORMAL HIGH (ref 70–99)
Potassium: 3.6 mmol/L (ref 3.5–5.1)
Sodium: 131 mmol/L — ABNORMAL LOW (ref 135–145)
Total Bilirubin: 0.5 mg/dL (ref 0.0–1.2)
Total Protein: 7.5 g/dL (ref 6.5–8.1)

## 2024-06-05 LAB — LIPID PANEL
Cholesterol: 196 mg/dL (ref 0–200)
HDL: 69 mg/dL (ref >40)
LDL Cholesterol: 113 mg/dL — ABNORMAL HIGH (ref 0–99)
Total CHOL/HDL Ratio: 2.8 ratio
Triglycerides: 68 mg/dL (ref <150)
VLDL: 14 mg/dL (ref 0–40)

## 2024-06-05 LAB — HEMOGLOBIN A1C
Hgb A1c MFr Bld: 5.9 % — ABNORMAL HIGH (ref 4.8–5.6)
Mean Plasma Glucose: 122.63 mg/dL

## 2024-06-05 LAB — TROPONIN I (HIGH SENSITIVITY)
Troponin I (High Sensitivity): 37 ng/L — ABNORMAL HIGH (ref <18)
Troponin I (High Sensitivity): 45 ng/L — ABNORMAL HIGH (ref <18)

## 2024-06-05 LAB — BRAIN NATRIURETIC PEPTIDE: B Natriuretic Peptide: 473.9 pg/mL — ABNORMAL HIGH (ref 0.0–100.0)

## 2024-06-05 LAB — MAGNESIUM: Magnesium: 1.8 mg/dL (ref 1.7–2.4)

## 2024-06-05 LAB — HIV ANTIBODY (ROUTINE TESTING W REFLEX): HIV Screen 4th Generation wRfx: NONREACTIVE

## 2024-06-05 MED ORDER — SODIUM CHLORIDE 0.9 % IV SOLN
500.0000 mg | INTRAVENOUS | Status: DC
  Filled 2024-06-05: qty 5

## 2024-06-05 MED ORDER — RIVAROXABAN 10 MG PO TABS
10.0000 mg | ORAL_TABLET | Freq: Every day | ORAL | Status: DC
  Filled 2024-06-05: qty 1

## 2024-06-05 MED ORDER — POTASSIUM CHLORIDE CRYS ER 20 MEQ PO TBCR
40.0000 meq | EXTENDED_RELEASE_TABLET | Freq: Once | ORAL | Status: AC
  Administered 2024-06-05: 40 meq via ORAL
  Filled 2024-06-05: qty 2

## 2024-06-05 MED ORDER — SODIUM CHLORIDE 0.9 % IV SOLN
2.0000 g | INTRAVENOUS | Status: DC
  Administered 2024-06-06 – 2024-06-08 (×3): 2 g via INTRAVENOUS
  Filled 2024-06-05 (×3): qty 20

## 2024-06-05 MED ORDER — ACETAMINOPHEN 650 MG RE SUPP: 650.0000 mg | Freq: Four times a day (QID) | RECTAL | Status: DC | PRN

## 2024-06-05 MED ORDER — SODIUM CHLORIDE 0.9% FLUSH
3.0000 mL | Freq: Two times a day (BID) | INTRAVENOUS | Status: DC
  Administered 2024-06-05 – 2024-06-08 (×6): 3 mL via INTRAVENOUS

## 2024-06-05 MED ORDER — FUROSEMIDE 10 MG/ML IJ SOLN
60.0000 mg | Freq: Once | INTRAMUSCULAR | Status: AC
  Administered 2024-06-05: 60 mg via INTRAVENOUS
  Filled 2024-06-05: qty 8

## 2024-06-05 MED ORDER — IPRATROPIUM-ALBUTEROL 0.5-2.5 (3) MG/3ML IN SOLN
6.0000 mL | Freq: Once | RESPIRATORY_TRACT | Status: AC
  Administered 2024-06-05: 6 mL via RESPIRATORY_TRACT
  Filled 2024-06-05: qty 6

## 2024-06-05 MED ORDER — IPRATROPIUM-ALBUTEROL 0.5-2.5 (3) MG/3ML IN SOLN: 3.0000 mL | Freq: Four times a day (QID) | RESPIRATORY_TRACT | Status: DC | PRN

## 2024-06-05 MED ORDER — SODIUM CHLORIDE 0.9 % IV SOLN
2.0000 g | Freq: Once | INTRAVENOUS | Status: AC
  Administered 2024-06-05: 2 g via INTRAVENOUS
  Filled 2024-06-05: qty 20

## 2024-06-05 MED ORDER — ACETAMINOPHEN 325 MG PO TABS: 650.0000 mg | ORAL_TABLET | Freq: Four times a day (QID) | ORAL | Status: DC | PRN

## 2024-06-05 MED ORDER — ENOXAPARIN SODIUM 40 MG/0.4ML IJ SOSY
40.0000 mg | PREFILLED_SYRINGE | INTRAMUSCULAR | Status: DC
  Administered 2024-06-05 – 2024-06-07 (×3): 40 mg via SUBCUTANEOUS
  Filled 2024-06-05 (×3): qty 0.4

## 2024-06-05 MED ORDER — SODIUM CHLORIDE 0.9 % IV SOLN
500.0000 mg | Freq: Once | INTRAVENOUS | Status: AC
  Administered 2024-06-05: 500 mg via INTRAVENOUS
  Filled 2024-06-05: qty 5

## 2024-06-05 MED ORDER — SENNOSIDES-DOCUSATE SODIUM 8.6-50 MG PO TABS: 1.0000 | ORAL_TABLET | Freq: Every evening | ORAL | Status: DC | PRN

## 2024-06-05 NOTE — Progress Notes (Signed)
 Patient continues removing and refusing  telemetry monitor; Dr. Fernand notified via secure chat and is ok with removing order. Plan of care continues.

## 2024-06-05 NOTE — Plan of Care (Signed)

## 2024-06-05 NOTE — ED Provider Notes (Signed)
 [facility] Provider Note    Event Date/Time   First MD Initiated Contact with Patient 06/05/24 1032     (approximate)   History   Shortness of Breath   HPI  Victoria Medina is a 63 y.o. female who presents to the ED for evaluation of Shortness of Breath   I reviewed her cardiology clinic visit from June.  History of stroke with left-sided hemiplegic deficits and resides at a local SNF.  CAD with multiple prior stents, moderately reduced EF.  Patient presents to the ED via EMS from her SNF for evaluation of shortness of breath after a choking episode.  She reports that she was feeling fine and then got choked up on her breakfast this morning, concerns for aspiration and developed shortness of breath after a coughing fit.   EMS found her hypoxic, did not tolerate BiPAP, provided DuoNebs with some improvement of her symptoms.  Arrives to the ED with nasal cannula and NRB in place with good sats.  She is able to provide history   Physical Exam   Triage Vital Signs: ED Triage Vitals [06/05/24 1032]  Encounter Vitals Group     BP 103/81     Girls Systolic BP Percentile      Girls Diastolic BP Percentile      Boys Systolic BP Percentile      Boys Diastolic BP Percentile      Pulse Rate (!) 102     Resp 19     Temp      Temp src      SpO2 97 %     Weight      Height      Head Circumference      Peak Flow      Pain Score      Pain Loc      Pain Education      Exclude from Growth Chart     Most recent vital signs: Vitals:   06/05/24 1330 06/05/24 1505  BP: 123/82 (!) 102/57  Pulse: 94 96  Resp: 17 16  Temp:  (!) 97.5 F (36.4 C)  SpO2: 97% 97%    General: Awake, no distress.  CV:  Good peripheral perfusion.  Resp:  Tachypnea to the mid 20s, no distress.  Able to speak in phrases to answer questions.  Wheezing throughout and slightly decreased airflow Abd:  No distention.  MSK:  No deformity noted.  Neuro:  No focal deficits  appreciated. Other:     ED Results / Procedures / Treatments   Labs (all labs ordered are listed, but only abnormal results are displayed) Labs Reviewed  COMPREHENSIVE METABOLIC PANEL WITH GFR - Abnormal; Notable for the following components:      Result Value   Sodium 131 (*)    Chloride 96 (*)    CO2 21 (*)    Glucose, Bld 201 (*)    Calcium  8.7 (*)    All other components within normal limits  CBC WITH DIFFERENTIAL/PLATELET - Abnormal; Notable for the following components:   WBC 14.5 (*)    Neutro Abs 12.5 (*)    Abs Immature Granulocytes 0.09 (*)    All other components within normal limits  BRAIN NATRIURETIC PEPTIDE - Abnormal; Notable for the following components:   B Natriuretic Peptide 473.9 (*)    All other components within normal limits  LIPID PANEL - Abnormal; Notable for the following components:   LDL Cholesterol 113 (*)    All  other components within normal limits  TROPONIN I (HIGH SENSITIVITY) - Abnormal; Notable for the following components:   Troponin I (High Sensitivity) 37 (*)    All other components within normal limits  TROPONIN I (HIGH SENSITIVITY) - Abnormal; Notable for the following components:   Troponin I (High Sensitivity) 45 (*)    All other components within normal limits  MAGNESIUM   HIV ANTIBODY (ROUTINE TESTING W REFLEX)  HEMOGLOBIN A1C    EKG Sinus rhythm with a rate of 102 bpm.  Normal intervals, no STEMI  RADIOLOGY 1 view CXR interpreted by me with interstitial infiltrates  Official radiology report(s): DG Chest Portable 1 View Result Date: 06/05/2024 EXAM: 1 VIEW(S) XRAY OF THE CHEST 06/05/2024 10:57:00 AM COMPARISON: 10/04/2023 CLINICAL HISTORY: aspirated food, feeling SOB. wheezing on exam FINDINGS: LUNGS AND PLEURA: New diffuse interstitial and patchy lung opacities. Relative sparing of the left upper lobe. Trace right pleural effusion. No pneumothorax. HEART AND MEDIASTINUM: Cardiomegaly. Aortic atherosclerotic calcification.  BONES AND SOFT TISSUES: No acute osseous abnormality. IMPRESSION: 1. New diffuse interstitial airspace opacities relatively diffusely. Favor multifocal infection or aspiration over pulmonary edema. 2. Trace right pleural effusion. 3. Aortic atherosclerosis ([id number]-i70.0). Electronically signed by: Rockey Kilts MD 06/05/2024 11:54 AM EST RP Workstation: [id number]    PROCEDURES and INTERVENTIONS:  .Critical Care  Performed by: Claudene Rover, MD Authorized by: Claudene Rover, MD   Critical care provider statement:    Critical care time (minutes):  30   Critical care time was exclusive of:  Separately billable procedures and treating other patients   Critical care was necessary to treat or prevent imminent or life-threatening deterioration of the following conditions:  Respiratory failure   Critical care was time spent personally by me on the following activities:  Development of treatment plan with patient or surrogate, discussions with consultants, evaluation of patient's response to treatment, examination of patient, ordering and review of laboratory studies, ordering and review of radiographic studies, ordering and performing treatments and interventions, pulse oximetry, re-evaluation of patient's condition and review of old charts .1-3 Lead EKG Interpretation  Performed by: Claudene Rover, MD Authorized by: Claudene Rover, MD     Interpretation: normal     ECG rate:  90   ECG rate assessment: normal     Rhythm: sinus rhythm     Ectopy: none     Conduction: normal     Medications  sodium chloride  flush (NS) 0.9 % injection 3 mL (3 mLs Intravenous Not Given 06/05/24 1335)  acetaminophen  (TYLENOL ) tablet 650 mg (has no administration in time range)    Or  acetaminophen  (TYLENOL ) suppository 650 mg (has no administration in time range)  ipratropium-albuterol (DUONEB) 0.5-2.5 (3) MG/3ML nebulizer solution 3 mL (has no administration in time range)  enoxaparin  (LOVENOX ) injection 40 mg (has no  administration in time range)  ipratropium-albuterol (DUONEB) 0.5-2.5 (3) MG/3ML nebulizer solution 6 mL (6 mLs Nebulization Given 06/05/24 1042)  potassium chloride  SA (KLOR-CON  M) CR tablet 40 mEq (40 mEq Oral Given 06/05/24 1245)  furosemide (LASIX) injection 60 mg (60 mg Intravenous Given 06/05/24 1246)  cefTRIAXone  (ROCEPHIN ) 2 g in sodium chloride  0.9 % 100 mL IVPB (0 g Intravenous Stopped 06/05/24 1326)  azithromycin (ZITHROMAX) 500 mg in sodium chloride  0.9 % 250 mL IVPB (500 mg Intravenous New Bag/Given 06/05/24 1328)     IMPRESSION / MDM / ASSESSMENT AND PLAN / ED COURSE  I reviewed the triage vital signs and the nursing notes.  Differential diagnosis includes, but is not limited  to, ACS, PTX, PNA, muscle strain/spasm, PE, dissection, anxiety, pleural effusion  {Patient presents with symptoms of an acute illness or injury that is potentially life-threatening.  Patient presents with signs of multifactorial hypoxic respiratory failure requiring medical admission.  Aspiration prior to arrival and likely subsequent bronchospasm with significant wheezing on my initial evaluation, resolved with DuoNebs.  Subsequently trialed on room air but becomes hypoxic requiring nasal cannula.  This is likely due to aspiration pneumonitis and signs of CHF exacerbation.  Due to continued hypoxia consult medicine for admission.  Clinical Course as of 06/05/24 1557  Thu Jun 05, 2024  1153 Reassessed and removed patient's supplemental oxygen.  She reports she is feeling much better, back to normal and she is asking for discharge.  I reassessed and her wheezing has resolved and she has good airflow on pulmonary auscultation. [DS]  1233 Patient again desaturated to the mid 80s on room air and was placed on nasal cannula. [DS]  1248 I consult hospitalist who agrees to admit. [DS]    Clinical Course User Index [DS] Claudene Rover, MD     FINAL CLINICAL IMPRESSION(S) / ED DIAGNOSES   Final diagnoses:  Acute  respiratory failure with hypoxia (HCC)     Rx / DC Orders   ED Discharge Orders     None        Note:  This document was prepared using Dragon voice recognition software and may include unintentional dictation errors.   Claudene Rover, MD 06/05/24 [phone]

## 2024-06-05 NOTE — ED Notes (Signed)
 This RN to bedside for monitor alarm, SpO2 80s on RA, placed on 2L [place] with improvement. Dr Claudene and primary RN notified.

## 2024-06-05 NOTE — ED Triage Notes (Signed)
 Patient to ED via [facility] from [facility] for SOB. Started this AM after eating breakfast. Initially 69% RA- wheezing bilaterally. Given 1 nitroglycerin  at facility.  89% on NRB with EMS- givne 1 albuterol and 125 solumedrol. Hx of stroke (left side deficits) and CHF.   134/84 116 HR

## 2024-06-05 NOTE — Plan of Care (Signed)
  Problem: Education: Goal: Knowledge of General Education information will improve Description: Including pain rating scale, medication(s)/side effects and non-pharmacologic comfort measures Outcome: Progressing   Problem: Clinical Measurements: Goal: Ability to maintain clinical measurements within normal limits will improve Outcome: Progressing   Problem: Activity: Goal: Risk for activity intolerance will decrease Outcome: Progressing   Problem: Nutrition: Goal: Adequate nutrition will be maintained Outcome: Progressing   Problem: Coping: Goal: Level of anxiety will decrease Outcome: Progressing   Problem: Pain Managment: Goal: General experience of comfort will improve and/or be controlled Outcome: Progressing   Problem: Safety: Goal: Ability to remain free from injury will improve Outcome: Progressing   Problem: Skin Integrity: Goal: Risk for impaired skin integrity will decrease Outcome: Progressing

## 2024-06-05 NOTE — H&P (Signed)
 History and Physical    Victoria Medina FMW:[id number] DOB: Sep 14, 1960 DOA: 06/05/2024  DOS: the patient was seen and examined on 06/05/2024  PCP: [name], DO   Patient coming from: SNF  I have personally briefly reviewed patient's old medical records in [facility] and CareEverywhere  HPI:   Victoria Medina is a 63 y.o. year old female with medical history of hypertension, hyperlipidemia, hypothyroidism CAD status post stent placement, history of stroke with right-sided deficits, blindness, chronic urinary retention status post suprapubic catheter presented to the ED after becoming hypoxic after having a choking spell at her facility.   Pt states she was doing fine and was at her baseline until she had a choking spell while she was eating breakfast and had shortness of breath afterwards.  She denies any other symptoms such as fevers or chills.   On arrival to the ED patient was noted to be HDS stable.  Lab work and imaging obtained.  CBC shows leukocytosis at 14.5, CMP shows mild hyponatremia, but normal renal function.  BMP checked and was elevated at 474.  Troponin checked and mildly elevated at 37 with repeat pending.  Chest x-ray obtained that showed diffuse interstitial opacities concerning for pulmonary edema with trace right-sided pleural effusion.  Given the need for continued care, TR[facility] contacted for admission.  Review of Systems: As mentioned in the history of present illness. All other systems reviewed and are negative.   Past Medical History:  Diagnosis Date   Acute ST elevation myocardial infarction (STEMI) of inferolateral wall (HCC) 01/25/2020   Blind    Cholecystitis 04/22/2021   History of migraine headaches    Hyperlipemia    Myocardial infarction [facility])    Stroke [facility])     Past Surgical History:  Procedure Laterality Date   ABDOMINAL HYSTERECTOMY     BACK SURGERY     CARDIAC CATHETERIZATION     CORONARY/GRAFT ACUTE MI REVASCULARIZATION N/A 01/25/2020    Procedure: Coronary/Graft Acute MI Revascularization;  Surgeon: [name], MD;  Location: [facility] INVASIVE CV LAB;  Service: Cardiovascular;  Laterality: N/A;   FRACTURE SURGERY     IR CATHETER TUBE CHANGE  08/10/2021   LEFT HEART CATH AND CORONARY ANGIOGRAPHY N/A 01/25/2020   Procedure: LEFT HEART CATH AND CORONARY ANGIOGRAPHY;  Surgeon: [name], MD;  Location: [facility] INVASIVE CV LAB;  Service: Cardiovascular;  Laterality: N/A;   SKIN GRAFT Right    TOTAL ABDOMINAL HYSTERECTOMY Bilateral      Allergies  Allergen Reactions   Levaquin [Levofloxacin In D5w] Anaphylaxis and Swelling   Iodinated Contrast Media Itching    Severe itching   Other     Dust mites and opiates (all of them)   Latex Dermatitis    Family History  Problem Relation Age of Onset   Heart attack Mother    Hypertension Mother    Hypercholesterolemia Mother    Diabetes Mother    Stroke Father    Heart attack Father    Hypertension Father    Hypercholesterolemia Father    Diabetes Father    Diabetes Maternal Grandmother    Cancer - Other Maternal Grandmother     Prior to Admission medications   Medication Sig Start Date End Date Taking? Authorizing Provider  amLODipine (NORVASC) 2.5 MG tablet Take 2.5 mg by mouth daily.    [provider]  aspirin  EC 81 MG tablet Take 81 mg by mouth daily.    [provider]  Azelastine  HCl 0.15 %  SOLN Place 2 sprays into both nostrils 2 (two) times daily.    [provider]  Cholecalciferol (VITAMIN D3) 1.25 MG (50000 UT) CAPS Take 1 capsule by mouth once a week.  every Mon for Supplement    [provider]  Dextromethorphan-Benzocaine (COUGH/SORE THROAT LOZENGES MT) Use as directed 1 drop in the mouth or throat every 4 (four) hours as needed.    [provider]  diazepam  (VALIUM ) 10 MG tablet TAKE 1 TABLET BY MOUTH FOUR TIMES A DAY 05/23/24   Fargo, Amy E, NP  ibuprofen  (ADVIL ) 800 MG tablet Take 800 mg by mouth 2  (two) times daily as needed for mild pain (pain score 1-3) or moderate pain (pain score 4-6).    [provider]  isosorbide mononitrate (IMDUR) 60 MG 24 hr tablet Take 60 mg by mouth daily.    [provider]  lansoprazole (PREVACID) 15 MG capsule Take 15 mg by mouth 2 (two) times daily before a meal.    [provider]  levothyroxine  (SYNTHROID ) 25 MCG tablet Take 25 mcg by mouth daily before breakfast.    [provider]  losartan  (COZAAR ) 100 MG tablet Take 100 mg by mouth daily.    [provider]  magnesium  hydroxide (MILK OF MAGNESIA) 400 MG/5ML suspension Take 5 mLs by mouth every 6 (six) hours as needed for mild constipation.    [provider]  nitroGLYCERIN  (NITROSTAT ) 0.4 MG SL tablet Place 0.4 mg under the tongue every 5 (five) minutes x 3 doses as needed for chest pain.     [provider]  nystatin  powder Apply 1 Application topically 3 (three) times daily.    [provider]  OLANZapine  (ZYPREXA ) 5 MG tablet Take 5 mg by mouth daily.    [provider]  ondansetron  (ZOFRAN -ODT) 4 MG disintegrating tablet Take 4 mg by mouth every 4 (four) hours as needed for nausea or vomiting.    [provider]  polyethylene glycol (MIRALAX  / GLYCOLAX ) 17 g packet Take 17 g by mouth daily.    [provider]  promethazine  (PHENERGAN ) 12.5 MG suppository Place 1 suppository (12.5 mg total) rectally every 6 (six) hours as needed for nausea or vomiting. 11/11/23   Goodman, Graydon, MD  ranolazine (RANEXA) 500 MG 12 hr tablet Take 500 mg by mouth 2 (two) times daily.    [provider]  rosuvastatin (CRESTOR) 5 MG tablet Take 5 mg by mouth daily. Every Tuesday,Thursday,Sat    [provider]  senna-docusate (SENOKOT-S) 8.6-50 MG tablet Take 2 tablets by mouth at bedtime.    [provider]  Vibegron  (GEMTESA ) 75 MG TABS Take 75 mg by mouth daily. 03/08/22   Vaillancourt, Samantha,  PA-C    Social History:  reports that she has never smoked. She has never used smokeless tobacco. She reports current alcohol use. She reports that she does not use drugs. Lives at skilled nursing facility Tobacco- Denies use. EtOH- Denies use.  Illicit drug use- denies use.  IADLs/ADLs-completely dependent  Physical Exam: Vitals:   06/05/24 1100 06/05/24 1130 06/05/24 1218 06/05/24 1230  BP: 117/80 111/66 102/73 127/86  Pulse: (!) 102 97 92 93  Resp: 18 20 (!) 25 18  Temp:      TempSrc:      SpO2: 94% 100% 98% 96%  Weight:      Height:         Gen: NAD HENT: NCAT, blind in both eyes, does have pupillary constriction  to light and she states he perceives slight right when shown but nothing else. CV: RRR, good pulses in all extremities Resp: CTAB Abd: No TTP, normal bowel sounds MSK: Left upper extremity in contracture that is chronic Skin: No lesions noted on skin Neuro: Alert and oriented x 4, CN II-XII grossly intact, right upper extremity with 5/5 strength, left upper extremity with 5 out of 5 strength proximal to the hand but no movement in the hand.  Bilateral lower extremities with 5 out of 5 strength. Psych: Pleasant mood   Labs on Admission: I have personally reviewed following labs and imaging studies  CBC: Recent Labs  Lab 06/05/24 1034  WBC 14.5*  NEUTROABS 12.5*  HGB 12.9  HCT 40.3  MCV 84.5  PLT 354   Basic Metabolic Panel: Recent Labs  Lab 06/05/24 1034  NA 131*  K 3.6  CL 96*  CO2 21*  GLUCOSE 201*  BUN 9  CREATININE 0.78  CALCIUM  8.7*   GFR: Estimated Creatinine Clearance: 71.9 mL/min (by C-G formula based on SCr of 0.78 mg/dL). Liver Function Tests: Recent Labs  Lab 06/05/24 1034  AST 18  ALT 8  ALKPHOS 83  BILITOT 0.5  PROT 7.5  ALBUMIN 3.6   No results for input(s): LIPASE, AMYLASE in the last 168 hours. No results for input(s): AMMONIA in the last 168 hours. Coagulation Profile: No results for input(s): INR,  PROTIME in the last 168 hours. Cardiac Enzymes: Recent Labs  Lab 06/05/24 1034  TROPONINIHS 37*   BNP (last 3 results) Recent Labs    06/05/24 1034  BNP 473.9*   HbA1C: No results for input(s): HGBA1C in the last 72 hours. CBG: No results for input(s): GLUCAP in the last 168 hours. Lipid Profile: No results for input(s): CHOL, HDL, LDLCALC, TRIG, CHOLHDL, LDLDIRECT in the last 72 hours. Thyroid Function Tests: No results for input(s): TSH, T4TOTAL, FREET4, T3FREE, THYROIDAB in the last 72 hours. Anemia Panel: No results for input(s): VITAMINB12, FOLATE, FERRITIN, TIBC, IRON, RETICCTPCT in the last 72 hours. Urine analysis:    Component Value Date/Time   COLORURINE COLORLESS (A) 11/11/2023 1721   APPEARANCEUR CLEAR (A) 11/11/2023 1721   APPEARANCEUR Hazy (A) 09/05/2022 1519   LABSPEC 1.001 (L) 11/11/2023 1721   LABSPEC 1.026 08/05/2014 1906   PHURINE 8.0 11/11/2023 1721   GLUCOSEU NEGATIVE 11/11/2023 1721   GLUCOSEU NEGATIVE 08/05/2014 1906   HGBUR SMALL (A) 11/11/2023 1721   BILIRUBINUR NEGATIVE 11/11/2023 1721   BILIRUBINUR Negative 09/05/2022 1519   BILIRUBINUR NEGATIVE 08/05/2014 1906   KETONESUR NEGATIVE 11/11/2023 1721   PROTEINUR NEGATIVE 11/11/2023 1721   NITRITE NEGATIVE 11/11/2023 1721   LEUKOCYTESUR MODERATE (A) 11/11/2023 1721   LEUKOCYTESUR 2+ 08/05/2014 1906    Radiological Exams on Admission: I have personally reviewed images DG Chest Portable 1 View Result Date: 06/05/2024 EXAM: 1 VIEW(S) XRAY OF THE CHEST 06/05/2024 10:57:00 AM COMPARISON: 10/04/2023 CLINICAL HISTORY: aspirated food, feeling SOB. wheezing on exam FINDINGS: LUNGS AND PLEURA: New diffuse interstitial and patchy lung opacities. Relative sparing of the left upper lobe. Trace right pleural effusion. No pneumothorax. HEART AND MEDIASTINUM: Cardiomegaly. Aortic atherosclerotic calcification. BONES AND SOFT TISSUES: No acute osseous abnormality.  IMPRESSION: 1. New diffuse interstitial airspace opacities relatively diffusely. Favor multifocal infection or aspiration over pulmonary edema. 2. Trace right pleural effusion. 3. Aortic atherosclerosis (icd10-i70.0). Electronically signed by: Rockey Kilts MD 06/05/2024 11:54 AM EST RP Workstation: [id number]    EKG: My personal interpretation of EKG shows: Sinus tachycardia without any  acute ST changes.    Assessment/Plan Principal Problem:   Acute hypoxic respiratory failure (HCC)   Acute hypoxic respiratory failure likely secondary to aspiration pneumonia versus pneumonitis and mild CHF exacerbation.  Patient is status post ceftriaxone  and azithromycin.  Her x-ray finding could be explained by pneumonitis after aspiration event.  I have added procalcitonin for tomorrow which can provide further antibiotic treatment.  She is not febrile and will monitor for fevers, also obtain blood cultures.  She also has mild CHF exacerbation and status post IV Lasix.  Will repeat echo, and redose Lasix based on her response.  On exam no significant signs of volume overload.  Will monitor her weight, and strict input and output.  Given patient had aspiration event, will have speech evaluate her to ensure she does not have any silent aspiration given her history of stroke.  Will keep her n.p.o. for the time being.  Chronic problems: Hypothyroidism: Will continue home Synthroid  once patient able to take p.o. Hypertension holding home medicines currently but can be resumed once patient able to take p.o.  She is normotensive and getting diuresed so no urgency to restart these. Hyperlipidemia: Will get lipid panel.  Restart Crestor once patient is taking p.o. CAD: Restart Imdur once patient taking p.o. Urinary retention: Patient with this problem since 2015 after her stroke.  She has suprapubic catheter.  Last seen earlier this month.  VTE prophylaxis:  Lovenox   Diet: NPO Code Status:  DNR/DNI(Do NOT  Intubate) Telemetry:  Admission status: Inpatient, Telemetry bed Patient is from: Home Anticipated d/c is to: Home Anticipated d/c is in: 2-3 days   Family Communication: Updated at bedside  Consults called: None   Severity of Illness: The appropriate patient status for this patient is INPATIENT. Inpatient status is judged to be reasonable and necessary in order to provide the required intensity of service to ensure the patient's safety. The patient's presenting symptoms, physical exam findings, and initial radiographic and laboratory data in the context of their chronic comorbidities is felt to place them at high risk for further clinical deterioration. Furthermore, it is not anticipated that the patient will be medically stable for discharge from the hospital within 2 midnights of admission.   * I certify that at the point of admission it is my clinical judgment that the patient will require inpatient hospital care spanning beyond 2 midnights from the point of admission due to high intensity of service, high risk for further deterioration and high frequency of surveillance required.DEWAINE Morene Bathe, MD [facility] DEL. [facility]

## 2024-06-05 NOTE — ED Notes (Signed)
 Lab at bedside

## 2024-06-05 NOTE — ED Notes (Signed)
 Fall bundle verified by this tech

## 2024-06-05 NOTE — ED Notes (Signed)
 NURSE LINDSEY INFORMED OF BED ASSIGNED

## 2024-06-05 NOTE — ED Notes (Signed)
 Lab called at this time for repeat trop due pt difficult stick and IV not pulling back.

## 2024-06-06 ENCOUNTER — Encounter: Payer: Self-pay | Admitting: Internal Medicine

## 2024-06-06 ENCOUNTER — Inpatient Hospital Stay
Admit: 2024-06-06 | Discharge: 2024-06-06 | Disposition: A | Discharge status: Home / Self Care | Attending: Internal Medicine | Admitting: Internal Medicine

## 2024-06-06 DIAGNOSIS — J9601 Acute respiratory failure with hypoxia: Secondary | ICD-10-CM | POA: Diagnosis not present

## 2024-06-06 LAB — ECHOCARDIOGRAM COMPLETE
AR max vel: 2.59 cm2
AV Area VTI: 2.44 cm2
AV Area mean vel: 2.38 cm2
AV Mean grad: 2 mmHg
AV Peak grad: 3.7 mmHg
Ao pk vel: 0.97 m/s
Area-P 1/2: 6.07 cm2
Calc EF: 12.3 %
Height: 60 in
S' Lateral: 2.69 cm
Single Plane A2C EF: 8.1 %
Single Plane A4C EF: 16.7 %
Weight: 2904.08 [oz_av]

## 2024-06-06 LAB — CBC
HCT: 35.9 % — ABNORMAL LOW (ref 36.0–46.0)
Hemoglobin: 12.3 g/dL (ref 12.0–15.0)
MCH: 28 pg (ref 26.0–34.0)
MCHC: 34.3 g/dL (ref 30.0–36.0)
MCV: 81.8 fL (ref 80.0–100.0)
Platelets: 352 K/uL (ref 150–400)
RBC: 4.39 MIL/uL (ref 3.87–5.11)
RDW: 13.9 % (ref 11.5–15.5)
WBC: 14.1 K/uL — ABNORMAL HIGH (ref 4.0–10.5)
nRBC: 0 % (ref 0.0–0.2)

## 2024-06-06 LAB — BASIC METABOLIC PANEL WITH GFR
Anion gap: 11 (ref 5–15)
BUN: 13 mg/dL (ref 8–23)
CO2: 25 mmol/L (ref 22–32)
Calcium: 8.5 mg/dL — ABNORMAL LOW (ref 8.9–10.3)
Chloride: 99 mmol/L (ref 98–111)
Creatinine, Ser: 0.96 mg/dL (ref 0.44–1.00)
GFR, Estimated: >60 mL/min (ref >60)
Glucose, Bld: 138 mg/dL — ABNORMAL HIGH (ref 70–99)
Potassium: 3.7 mmol/L (ref 3.5–5.1)
Sodium: 135 mmol/L (ref 135–145)

## 2024-06-06 LAB — PROCALCITONIN: Procalcitonin: <0.1 ng/mL

## 2024-06-06 LAB — GLUCOSE, CAPILLARY: Glucose-Capillary: 130 mg/dL — ABNORMAL HIGH (ref 70–99)

## 2024-06-06 MED ORDER — LEVOTHYROXINE SODIUM 25 MCG PO TABS
25.0000 ug | ORAL_TABLET | Freq: Every day | ORAL | Status: DC
  Administered 2024-06-07 – 2024-06-08 (×2): 25 ug via ORAL
  Filled 2024-06-06 (×2): qty 1

## 2024-06-06 MED ORDER — NITROGLYCERIN 0.4 MG SL SUBL: 0.4000 mg | SUBLINGUAL_TABLET | SUBLINGUAL | Status: DC | PRN

## 2024-06-06 MED ORDER — SENNOSIDES-DOCUSATE SODIUM 8.6-50 MG PO TABS
2.0000 | ORAL_TABLET | Freq: Every day | ORAL | Status: DC
  Administered 2024-06-06 – 2024-06-07 (×2): 2 via ORAL
  Filled 2024-06-06 (×2): qty 2

## 2024-06-06 MED ORDER — HYDRALAZINE HCL 20 MG/ML IJ SOLN: 5.0000 mg | Freq: Four times a day (QID) | INTRAMUSCULAR | Status: DC | PRN

## 2024-06-06 MED ORDER — FLUTICASONE PROPIONATE 50 MCG/ACT NA SUSP
1.0000 | Freq: Two times a day (BID) | NASAL | Status: DC | PRN
Start: 2024-06-06 — End: 2024-06-08

## 2024-06-06 MED ORDER — OLANZAPINE 5 MG PO TABS
5.0000 mg | ORAL_TABLET | Freq: Every day | ORAL | Status: DC
  Administered 2024-06-06 – 2024-06-08 (×3): 5 mg via ORAL
  Filled 2024-06-06 (×3): qty 1

## 2024-06-06 MED ORDER — POLYETHYLENE GLYCOL 3350 17 G PO PACK
17.0000 g | PACK | Freq: Every day | ORAL | Status: DC | PRN
  Administered 2024-06-07: 17 g via ORAL
  Filled 2024-06-06: qty 1

## 2024-06-06 MED ORDER — SODIUM CHLORIDE 0.9 % IV SOLN
500.0000 mg | INTRAVENOUS | Status: DC
  Administered 2024-06-06 – 2024-06-07 (×2): 500 mg via INTRAVENOUS
  Filled 2024-06-06 (×3): qty 5

## 2024-06-06 MED ORDER — MAGNESIUM HYDROXIDE 400 MG/5ML PO SUSP: 5.0000 mL | Freq: Four times a day (QID) | ORAL | Status: DC | PRN

## 2024-06-06 MED ORDER — ROSUVASTATIN CALCIUM 5 MG PO TABS
5.0000 mg | ORAL_TABLET | ORAL | Status: DC
  Administered 2024-06-07 – 2024-06-08 (×2): 5 mg via ORAL
  Filled 2024-06-06 (×2): qty 1

## 2024-06-06 MED ORDER — RANOLAZINE ER 500 MG PO TB12
500.0000 mg | ORAL_TABLET | Freq: Two times a day (BID) | ORAL | Status: DC
  Administered 2024-06-06 – 2024-06-08 (×5): 500 mg via ORAL
  Filled 2024-06-06 (×7): qty 1

## 2024-06-06 MED ORDER — ISOSORBIDE MONONITRATE ER 30 MG PO TB24
60.0000 mg | ORAL_TABLET | Freq: Every day | ORAL | Status: DC
  Administered 2024-06-06 – 2024-06-08 (×3): 60 mg via ORAL
  Filled 2024-06-06 (×3): qty 2

## 2024-06-06 MED ORDER — AMLODIPINE BESYLATE 5 MG PO TABS
2.5000 mg | ORAL_TABLET | Freq: Every day | ORAL | Status: DC
  Administered 2024-06-07 – 2024-06-08 (×2): 2.5 mg via ORAL
  Filled 2024-06-06 (×2): qty 1

## 2024-06-06 MED ORDER — LOSARTAN POTASSIUM 50 MG PO TABS
100.0000 mg | ORAL_TABLET | Freq: Every day | ORAL | Status: DC
  Administered 2024-06-07 – 2024-06-08 (×2): 100 mg via ORAL
  Filled 2024-06-06 (×2): qty 2

## 2024-06-06 NOTE — Assessment & Plan Note (Signed)
 Levothyroxine  25 mcg daily before breakfast resume

## 2024-06-06 NOTE — Assessment & Plan Note (Signed)
 PDMP reviewed Currently no prescription Last diazepam  10 mg tablet, 8 tablet, 2-day supply was written on 09/19/2022 and filled on 11/26/2022

## 2024-06-06 NOTE — Assessment & Plan Note (Signed)
>>  ASSESSMENT AND PLAN FOR HLD (HYPERLIPIDEMIA) WRITTEN ON 06/06/2024 10:23 AM BY COX, AMY N, DO  Home rosuvastatin 5 mg per home dosing resumed

## 2024-06-06 NOTE — Progress Notes (Signed)
*  PRELIMINARY RESULTS* Echocardiogram 2D Echocardiogram has been performed.  Victoria Medina 06/06/2024, 9:08 AM

## 2024-06-06 NOTE — TOC Progression Note (Signed)
 Transition of Care [facility]) - Progression Note    Patient Details  Name: Victoria Medina MRN: [id number] Date of Birth: 11-May-1961  [facility]) CM/SW Contact  Marinda Cooks, RN Phone Number: 06/06/2024, 2:39 PM  Clinical Narrative:    This  CM confirmed that pt is a TC resident at [facility] and can be rec'd back to community/ SNF when medically cleared . TOC will cont to follow dc planning/care coordination and update as applicable.                    Expected Discharge Plan and Services                                               Social Drivers of Health (SDOH) Interventions SDOH Screenings   Food Insecurity: No Food Insecurity (06/05/2024)  Housing: Unknown (06/05/2024)  Transportation Needs: No Transportation Needs (06/05/2024)  Utilities: Not At Risk (06/05/2024)  Depression (PHQ2-9): Low Risk  (05/25/2024)  Tobacco Use: Low Risk  (06/05/2024)    Readmission Risk Interventions     No data to display

## 2024-06-06 NOTE — Evaluation (Signed)
 Clinical/Bedside Swallow Evaluation Patient Details  Name: Victoria Medina MRN: [id number] Date of Birth: [birth year]  Today's Date: 06/06/2024 Time: SLP Start Time (ACUTE ONLY): 0920 SLP Stop Time (ACUTE ONLY): 1020 SLP Time Calculation (min) (ACUTE ONLY): 60 min  Past Medical History:  Past Medical History:  Diagnosis Date   Acute ST elevation myocardial infarction (STEMI) of inferolateral wall (HCC) 01/25/2020   Blind    Cholecystitis 04/22/2021   History of migraine headaches    Hyperlipemia    Myocardial infarction (HCC)    Stroke [facility])    Past Surgical History:  Past Surgical History:  Procedure Laterality Date   ABDOMINAL HYSTERECTOMY     BACK SURGERY     CARDIAC CATHETERIZATION     CORONARY/GRAFT ACUTE MI REVASCULARIZATION N/A 01/25/2020   Procedure: Coronary/Graft Acute MI Revascularization;  Surgeon: [name], MD;  Location: [facility] INVASIVE CV LAB;  Service: Cardiovascular;  Laterality: N/A;   FRACTURE SURGERY     IR CATHETER TUBE CHANGE  08/10/2021   LEFT HEART CATH AND CORONARY ANGIOGRAPHY N/A 01/25/2020   Procedure: LEFT HEART CATH AND CORONARY ANGIOGRAPHY;  Surgeon: [name], MD;  Location: [facility] INVASIVE CV LAB;  Service: Cardiovascular;  Laterality: N/A;   SKIN GRAFT Right    TOTAL ABDOMINAL HYSTERECTOMY Bilateral    HPI:  Pt is a female seen w/ chronic diseases and PMH including: NSTEMI, CAD, DVT, PE, HTN, HFrEF, migraines, OSA, GERD, hypothyroidism, Stroke with LEFT hemiplegia, 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  She resides on the skilled nursing unit at [facility].  She presented to the ED via EMS from her SNF for evaluation of shortness of breath after a choking episode.  She reports that she was feeling fine and then got choked up on her breakfast that morning, concerns for aspiration and developed shortness of breath  after a coughing fit..  Pt reported to this SLP that she does not sit up to eat her meals every time- she indicated it was her choice to do it or not.  CXR at admit: New diffuse interstitial airspace opacities relatively diffusely. Favor  multifocal infection or aspiration over pulmonary edema.  2. Trace right pleural effusion.    Assessment / Plan / Recommendation  Clinical Impression   Pt seen for BSE today. Pt awake, verbal and followed instruction when asked to- pt was Not initially inclined to sit upright in bed for the po trials stating she usually did not sit up all the way when eating/drinking at TL. Pt has Chronic Left labial-facial weakness d/t old stroke. Pt indicated she usually eats/drinks w/out any swallowing issues even when I don't sit up at Baseline.  On RA, afebrile. WBC elevated.  Pt appears to present w/ functional oropharyngeal phase swallowing w/ No overt oropharyngeal phase dysphagia noted, No sensorimotor deficits impacting the swallow noted. Pt consumed po trials w/ No overt, clinical s/s of aspiration during po trials.  Pt appears at reduced risk for aspiration when following general aspiration precautions w/ oral intake- including sitting Upright for po intake. However, pt does have challenging factors that could impact her oropharyngeal swallowing to include deconditioning, vision deficits/blindness, inability to use LUE, need for support/setup at meals, and not inclined to sit fully Upright for oral intake. These factors can increase risk for dysphagia as well as decreased oral intake overall.   During po trials, pt consumed all consistencies w/ no overt coughing, decline  in vocal quality, or change in respiratory presentation during/post trials. O2 sats 99% when checked. Oral phase appeared [facility] w/ timely bolus management, mastication, and control of bolus propulsion for A-P transfer for swallowing. Oral clearing achieved w/ all trial consistencies -- moistened, soft  foods given.  OM Exam appeared [facility] for lingual strength/ROM; mild labial-facial (L) weakness noted which is Chronic at baseline. Speech intelligible. Pt fed self by Holding Cup to drink w/ setup support.   Recommend a fairly Regular consistency diet w/ well-Cut meats, moistened foods; Finger Foods as is typical in her diet per pt report. Thin liquids -- pt should help to Hold Cup when drinking. Recommend general aspiration precautions, Sitting Fully Upright for all oral intake. Reduce talking/distractions during meals. Pills WHOLE in Puree for safer, easier swallowing if determined needed by NSG.   Education given on Pills in Puree; food consistencies and easy to eat options- finger foods; general aspiration precautions including need to sit upright w/ all oral intake to pt.  No further skilled ST services indicated at this time as pt appears at her Baseline. NSG to reconsult if any new needs arise. NSG updated, agreed. MD updated. Recommend Dietician f/u for support. SLP Visit Diagnosis: Dysphagia, unspecified (R13.10) (Chronic comorbidities including left sided weakness)    Aspiration Risk   (reduced when following general aspiration precautions and sitting UP for oral intake)    Diet Recommendation   Thin;Age appropriate regular (cut meats, moistened; finger foods baseline) = a fairly Regular consistency diet w/ well-Cut meats, moistened foods; Finger Foods as is typical in her diet per pt report. Thin liquids -- pt should help to Hold Cup when drinking. Recommend general aspiration precautions, Sitting Fully Upright for all oral intake. Reduce talking/distractions during meals.   Medication Administration: Whole meds with puree (as needed for safer swallowing per NSG)    Other  Recommendations Oral Care Recommendations: Oral care BID;Patient independent with oral care (setup)     Assistance Recommended at Discharge  Intermittent at meals for setup and sitting up positioning  Functional Status  Assessment Patient has not had a recent decline in their functional status  Frequency and Duration  (n/a)   (n/a)       Prognosis Prognosis for improved oropharyngeal function: Good Barriers to Reach Goals: Behavior (needs to sit up for oral intake)      Swallow Study   General Date of Onset: 06/05/24 HPI: Pt is a female seen w/ chronic diseases and PMH including: NSTEMI, CAD, DVT, PE, HTN, HFrEF, migraines, OSA, GERD, hypothyroidism, Stroke with LEFT hemiplegia, 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  She resides on the skilled nursing unit at [facility].  She presented to the ED via EMS from her SNF for evaluation of shortness of breath after a choking episode.  She reports that she was feeling fine and then got choked up on her breakfast that morning, concerns for aspiration and developed shortness of breath after a coughing fit..  Pt reported to this SLP that she does not sit up to eat her meals every time- she indicated it was her choice to do it or not.  CXR at admit: New diffuse interstitial airspace opacities relatively diffusely. Favor  multifocal infection or aspiration over pulmonary edema.  2. Trace right pleural effusion. Type of Study: Bedside Swallow Evaluation Previous Swallow Assessment: seen in 2015 post stroke Diet Prior to this Study: NPO (regular diet  at TL) Temperature Spikes Noted: No (wbc elevated) Respiratory Status: Room air History of Recent Intubation: No Behavior/Cognition: Alert;Cooperative;Pleasant mood;Requires cueing (encouragement) Oral Cavity Assessment: Within Functional Limits Oral Care Completed by SLP: Recent completion by staff Oral Cavity - Dentition: Adequate natural dentition Vision: Impaired for self-feeding (legal blindness baseline) Self-Feeding Abilities: Needs assist;Needs set up;Total assist Patient Positioning:  Upright in bed (support given; and encouragement for upright sitting) Baseline Vocal Quality: Normal Volitional Cough: Strong Volitional Swallow: Able to elicit    Oral/Motor/Sensory Function Overall Oral Motor/Sensory Function: Mild impairment (CHRONIC) Facial ROM: Reduced left;Suspected CN VII (facial) dysfunction Facial Symmetry: Abnormal symmetry left;Suspected CN VII (facial) dysfunction Facial Strength: Reduced left;Suspected CN VII (facial) dysfunction (min) Lingual ROM: Within Functional Limits Lingual Symmetry: Within Functional Limits Lingual Strength: Within Functional Limits Velum: Within Functional Limits Mandible: Within Functional Limits   Ice Chips Ice chips: Not tested   Thin Liquid Thin Liquid: Within functional limits Presentation: Cup;Self Fed;Straw (~8 ozs)    Nectar Thick Nectar Thick Liquid: Not tested   Honey Thick Honey Thick Liquid: Not tested   Puree Puree: Within functional limits Presentation: Spoon (fed; ~4 ozs)   Solid     Solid: Within functional limits Presentation: Spoon (fed; 2 trials accepted) Other Comments: I have no problem chewing food        Comer Portugal, MS, CCC-SLP Speech Language Pathologist Rehab Services; [facility] [phone] (ascom) Tyton Abdallah 06/06/2024,10:59 AM

## 2024-06-06 NOTE — Assessment & Plan Note (Signed)
 This meets criteria for morbid obesity based on the presence of 1 or more chronic comorbidities. Patient has CAD and BMI of 35.45. This complicates overall care and prognosis.

## 2024-06-06 NOTE — Assessment & Plan Note (Signed)
 Home rosuvastatin 5 mg per home dosing resumed

## 2024-06-06 NOTE — Assessment & Plan Note (Signed)
ACP reviewed

## 2024-06-06 NOTE — Hospital Course (Addendum)
 Ms. Victoria Medina is a 63 year old female with history of CAD, stroke, flaccid hemiaplasia, anxiety, depression, hyperlipidemia, hypothyroid, hypertension, OSA, morbid obesity, who presents for chief concerns of shortness of breath from [facility] after eating breakfast.  Patient initially had reports of SpO2 of 69% on room air with wheezing bilaterally.  Facility gave patient 1 dose of nitroglycerin .  SpO2 with 89% on nonrebreather, and EMS gave 1 dose of albuterol and Solu-Medrol  125 mg.  In the ED serum sodium was 131, potassium 3.6, chloride 96, bicarb 21, nonfasting blood glucose 201, BUN of 9, serum creatinine of 0.78, eGFR > 60.  WBC 14.5, hemoglobin 12.9, platelets of 354.  ED treatment: DuoNebs one-time treatment, furosemide 60 mg IV one-time dose, azithromycin 500 mg, ceftriaxone  2 g IV one-time dose.  06/05/2024: Patient admitted to hospitalist service for acute hypoxic respiratory secondary to aspiration pneumonia versus pneumonitis and mild CHF exacerbation.  11/7: Day 2 of antibiotics. 11/8: day 3 of abx. Leukocytosis improved.

## 2024-06-06 NOTE — Progress Notes (Signed)
 Patient refused to have labs drawn. Provider notified secured chat.

## 2024-06-06 NOTE — Assessment & Plan Note (Addendum)
 Suspect secondary to multi lobar pneumonia, treatment per above Recheck CBC in a.m. Continue to downtrend as appropriate.

## 2024-06-06 NOTE — Assessment & Plan Note (Signed)
 Amlodipine 2.5 mg daily, isosorbide mononitrate 60 mg daily, losartan  100 mg daily were resumed Hydralazine  5 mg IV every 6 hours as needed for SBP greater 170, 5 days ordered

## 2024-06-06 NOTE — Plan of Care (Signed)

## 2024-06-06 NOTE — Progress Notes (Signed)
 Patient's IV infiltrated running IV antibiotic Azithromycin. Pharmacy tech called and verified elevating right upper extremity as intervention.  Provider notified via secure chat and IV consult placed for new access.  Plan of care continues.
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
## 2024-06-06 NOTE — Assessment & Plan Note (Signed)
 Continuous pulse oximetry Suspect secondary to multifocal pneumonia however BNP and troponins were elevated, therefore heart failure cannot be excluded at this time Complete echo ordered by admitting provider Continue azithromycin 500 mg IV daily, ceftriaxone  2 g IV daily to complete a 5-day course Maintain SpO2 greater than 92%

## 2024-06-06 NOTE — Assessment & Plan Note (Signed)
-   CPAP at bedtime

## 2024-06-07 DIAGNOSIS — J9601 Acute respiratory failure with hypoxia: Secondary | ICD-10-CM | POA: Diagnosis not present

## 2024-06-07 LAB — BASIC METABOLIC PANEL WITH GFR
Anion gap: 10 (ref 5–15)
BUN: 18 mg/dL (ref 8–23)
CO2: 24 mmol/L (ref 22–32)
Calcium: 8.5 mg/dL — ABNORMAL LOW (ref 8.9–10.3)
Chloride: 99 mmol/L (ref 98–111)
Creatinine, Ser: 0.89 mg/dL (ref 0.44–1.00)
GFR, Estimated: >60 mL/min (ref >60)
Glucose, Bld: 118 mg/dL — ABNORMAL HIGH (ref 70–99)
Potassium: 3.6 mmol/L (ref 3.5–5.1)
Sodium: 133 mmol/L — ABNORMAL LOW (ref 135–145)

## 2024-06-07 LAB — CBC
HCT: 35.2 % — ABNORMAL LOW (ref 36.0–46.0)
Hemoglobin: 11.8 g/dL — ABNORMAL LOW (ref 12.0–15.0)
MCH: 27.8 pg (ref 26.0–34.0)
MCHC: 33.5 g/dL (ref 30.0–36.0)
MCV: 82.8 fL (ref 80.0–100.0)
Platelets: 335 K/uL (ref 150–400)
RBC: 4.25 MIL/uL (ref 3.87–5.11)
RDW: 13.9 % (ref 11.5–15.5)
WBC: 12.2 K/uL — ABNORMAL HIGH (ref 4.0–10.5)
nRBC: 0 % (ref 0.0–0.2)

## 2024-06-07 LAB — GLUCOSE, CAPILLARY
Glucose-Capillary: 130 mg/dL — ABNORMAL HIGH (ref 70–99)
Glucose-Capillary: 142 mg/dL — ABNORMAL HIGH (ref 70–99)
Glucose-Capillary: 129 mg/dL — ABNORMAL HIGH (ref 70–99)

## 2024-06-07 NOTE — Plan of Care (Signed)

## 2024-06-07 NOTE — Plan of Care (Signed)
  Problem: Skin Integrity: Goal: Risk for impaired skin integrity will decrease Outcome: Progressing   Problem: Safety: Goal: Ability to remain free from injury will improve Outcome: Progressing   Problem: Elimination: Goal: Will not experience complications related to bowel motility Outcome: Progressing   Problem: Nutrition: Goal: Adequate nutrition will be maintained Outcome: Progressing   Problem: Health Behavior/Discharge Planning: Goal: Ability to manage health-related needs will improve Outcome: Progressing

## 2024-06-07 NOTE — Evaluation (Signed)
 Physical Therapy Evaluation Patient Details Name: Victoria Medina MRN: [id number] DOB: [birth year] Today's Date: 06/07/2024  History of Present Illness  Victoria Medina is a 63 y.o. year old female with medical history of hypertension, hyperlipidemia, hypothyroidism CAD status post stent placement, history of stroke with right-sided deficits, blindness, chronic urinary retention status post suprapubic catheter presented to the ED after becoming hypoxic after having a choking spell at her facility.  Clinical Impression  Patient noted to be in supine position at PT arrival in room, for an initial PT evaluation due to a decline in functional status, with baseline mobility reported as wheel chair level needing assistance for transfers and ADLs, and currently requiring minA x2  for transfers and modA for ambulation in room to transition to chair. The patient is A&O x 2 not completely oriented to place and situation, presenting with good willingness to work with PT and goals of going back to [facility]. Vitals are normal with an SpO? of 95% on RM, and gait was assessed with +1 hand held assist, limited by L side hemiplegia and fatigue. Gait mechanic observations noted L sided weakness and weight acceptance but shows good potential for progressive mobility. The overall clinical impression is that the patient presents with moderate mobility limitations secondary to acute complication and L side weakness. Recommended skilled PT will address safety, mobility, and discharge planning. PT recommendation to d/c patient to SNF upon medical clearance.        If plan is discharge home, recommend the following: Two people to help with walking and/or transfers;Two people to help with bathing/dressing/bathroom   Can travel by private vehicle        Equipment Recommendations Other (comment) (TBD)  Recommendations for Other Services       Functional Status Assessment Patient has had a recent decline in their functional  status and/or demonstrates limited ability to make significant improvements in function in a reasonable and predictable amount of time     Precautions / Restrictions Precautions Precaution/Restrictions Comments: blindness Restrictions Weight Bearing Restrictions Per Provider Order: No      Mobility  Bed Mobility Overal bed mobility: Needs Assistance Bed Mobility: Rolling, Sidelying to Sit Rolling: Min assist Sidelying to sit: Min assist       General bed mobility comments: light physical assistance for LE placement to bedside    Transfers Overall transfer level: Needs assistance Equipment used: 1 person hand held assist Transfers: Sit to/from Stand Sit to Stand: Min assist, +2 safety/equipment           General transfer comment: NT in room provide light assistance with standing likely ModA with one personel; +2 needed for safety only and position in chair in room after bout of ambulation    Ambulation/Gait Ambulation/Gait assistance: Min assist, Mod assist Gait Distance (Feet): 8 Feet Assistive device: 1 person hand held assist Gait Pattern/deviations: Step-to pattern, Trendelenburg, Narrow base of support, Decreased stride length, Knees buckling       General Gait Details: impaired weight acceptane on L; forward and lateral steps to chair in room from bed; reports of L sided fatigue with transition to chair unable to position self in chair after gait due to fatigue  Stairs            Wheelchair Mobility     Tilt Bed    Modified Rankin (Stroke Patients Only)       Balance Overall balance assessment: Needs assistance Sitting-balance support: Feet supported Sitting balance-Leahy Scale: Good  Standing balance support: During functional activity, Reliant on assistive device for balance Standing balance-Leahy Scale: Poor Standing balance comment: reliance on therapist in room for standing support                             Pertinent  Vitals/Pain Pain Assessment Pain Assessment: No/denies pain    Home Living Family/patient expects to be discharged to:: Skilled nursing facility                   Additional Comments: Long-term resident at [facility], participating with restorative mobiltiy services    Prior Function Prior Level of Function : Needs assist  Cognitive Assist : Mobility (cognitive) Mobility (Cognitive): Set up cues   Physical Assist : Mobility (physical) Mobility (physical): Bed mobility;Transfers;Gait;Stairs   Mobility Comments: WC level as primary mobility at baseline; does endorse desire and ability for ambulation trails and participates with mobilty program with ambulation ADLs Comments: Pt recieves assistance for all BADLs at baseline     Extremity/Trunk Assessment   Upper Extremity Assessment Upper Extremity Assessment: Defer to OT evaluation    Lower Extremity Assessment Lower Extremity Assessment: Generalized weakness;LLE deficits/detail LLE Deficits / Details: L side hemaplegia weakness and extension spasm with standing intermittently    Cervical / Trunk Assessment Cervical / Trunk Assessment: Kyphotic  Communication   Communication Communication: Impaired    Cognition Arousal: Alert Behavior During Therapy: WFL for tasks assessed/performed   PT - Cognitive impairments: No apparent impairments, Awareness, History of cognitive impairments                       PT - Cognition Comments: does reports that she hasn't slept for 48 hours although she was sleep with first attempt to PT; per nurse pt. has made reports bein places other than the hospital; not completely aware of situation and/or place Following commands: Intact       Cueing Cueing Techniques: Verbal cues     General Comments      Exercises     Assessment/Plan    PT Assessment Patient needs continued PT services  PT Problem List Decreased strength;Decreased range of motion;Decreased activity  tolerance;Decreased balance;Decreased mobility;Decreased coordination       PT Treatment Interventions DME instruction;Gait training;Stair training;Functional mobility training;Therapeutic activities;Therapeutic exercise;Neuromuscular re-education;Balance training;Patient/family education    PT Goals (Current goals can be found in the Care Plan section)  Acute Rehab PT Goals Patient Stated Goal: Pt states that she wants to return to [facility] PT Goal Formulation: With patient Time For Goal Achievement: 06/21/24 Potential to Achieve Goals: Fair    Frequency Min 2X/week     Co-evaluation               AM-PAC PT 6 Clicks Mobility  Outcome Measure Help needed turning from your back to your side while in a flat bed without using bedrails?: A Little Help needed moving from lying on your back to sitting on the side of a flat bed without using bedrails?: A Little Help needed moving to and from a bed to a chair (including a wheelchair)?: A Little Help needed standing up from a chair using your arms (e.g., wheelchair or bedside chair)?: A Little Help needed to walk in hospital room?: A Little Help needed climbing 3-5 steps with a railing? : A Lot 6 Click Score: 17    End of Session Equipment Utilized During Treatment: Gait belt Activity Tolerance:  Patient tolerated treatment well;Patient limited by fatigue Patient left: in chair;with nursing/sitter in room Nurse Communication: Mobility status PT Visit Diagnosis: Other abnormalities of gait and mobility (R26.89);Unsteadiness on feet (R26.81);Difficulty in walking, not elsewhere classified (R26.2);Muscle weakness (generalized) (M62.81)    Time: 1331-1350 PT Time Calculation (min) (ACUTE ONLY): 19 min   Charges:   PT Evaluation $PT Eval Low Complexity: 1 Low   PT General Charges $$ ACUTE PT VISIT: 1 Visit         Sherlean Lesches DPT, PT    Tray Klayman A Terrion Poblano 06/07/2024, 2:11 PM

## 2024-06-07 NOTE — Progress Notes (Signed)
 PROGRESS NOTE  Victoria Medina  FMW:[id number] DOB: 1961/05/22 DOA: 06/05/2024 PCP: [name], DO   Ms. Victoria Medina is a 63 year old female with history of CAD, stroke, flaccid hemiaplasia, anxiety, depression, hyperlipidemia, hypothyroid, hypertension, OSA, morbid obesity, who presents for chief concerns of shortness of breath from [facility] after eating breakfast.  Patient initially had reports of SpO2 of 69% on room air with wheezing bilaterally.  Facility gave patient 1 dose of nitroglycerin .  SpO2 with 89% on nonrebreather, and EMS gave 1 dose of albuterol and Solu-Medrol  125 mg.  In the ED serum sodium was 131, potassium 3.6, chloride 96, bicarb 21, nonfasting blood glucose 201, BUN of 9, serum creatinine of 0.78, eGFR > 60.  WBC 14.5, hemoglobin 12.9, platelets of 354.  ED treatment: DuoNebs one-time treatment, furosemide 60 mg IV one-time dose, azithromycin 500 mg, ceftriaxone  2 g IV one-time dose.  06/05/2024: Patient admitted to hospitalist service for acute hypoxic respiratory secondary to aspiration pneumonia versus pneumonitis and mild CHF exacerbation.  11/7: Day 2 of antibiotics. 11/8: day 3 of abx. Leukocytosis improved.  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, unspecified laterality (HCC)   Assessment and Plan:  * Acute hypoxic respiratory failure (HCC) Continuous pulse oximetry Suspect secondary to multifocal pneumonia however BNP and troponins were elevated, therefore heart failure cannot  be excluded at this time Complete echo ordered by admitting provider Continue azithromycin 500 mg IV daily, ceftriaxone  2 g IV daily to complete a 5-day course Maintain SpO2 greater than 92%  Essential hypertension Amlodipine 2.5 mg daily, isosorbide mononitrate 60 mg daily, losartan  100 mg daily were resumed Hydralazine  5 mg IV every 6 hours as needed for SBP greater 170, 5 days ordered  DNR (do not resuscitate) ACP reviewed  HLD (hyperlipidemia) Home rosuvastatin 5 mg per home dosing resumed  Obstructive sleep apnea (adult) (pediatric) CPAP at bedtime  Obesity, morbid (HCC) This meets criteria for morbid obesity based on the presence of 1 or more chronic comorbidities. Patient has CAD and BMI of 35.45. This complicates overall care and prognosis.   GAD (generalized anxiety disorder) PDMP reviewed Currently no prescription Last diazepam  10 mg tablet, 8 tablet, 2-day supply was written on 09/19/2022 and filled on 11/26/2022  Hypothyroidism Levothyroxine  25 mcg daily before breakfast resume  Leukocytosis Suspect secondary to multi lobar pneumonia, treatment per above Recheck CBC in a.m.  DVT prophylaxis: Enoxaparin  Code Status: DNR/DNI Family Communication:  Disposition Plan: Anticipate discharge tomorrow Level of care: Telemetry  Consultants:  PT, OT, TOC  Procedures:  None indicated  Antimicrobials: Ceftriaxone  2 g IV daily, azithromycin  Subjective:  At bedside, patient able to tell me her first last name, age, location, current calendar year.  She reports she is feeling better now.  She reports she will chew her food more so that she does not choke.  Objective: Vitals:   06/06/24 1947 06/07/24 0544 06/07/24 0758 06/07/24 1500  BP: 103/77 106/74 (!) 136/91 111/72  Pulse: 93 85 86 93  Resp: 18 17 16 16   Temp:  98.7 F (37.1 C) 97.8 F (36.6 C) 98.2 F (36.8 C) 98.2 F (36.8 C)  TempSrc: Oral Oral  Oral  SpO2: 95% 94% 93% 100%  Weight:      Height:         Intake/Output Summary (Last 24 hours) at 06/07/2024 1557 Last data filed at 06/07/2024 1543 Gross per 24 hour  Intake 240 ml  Output 1350 ml  Net -1110 ml   Filed Weights   06/05/24 1035 06/06/24 0615  Weight: 88 kg 82.3 kg    Examination:  General exam: Appears calm and comfortable  Respiratory system: Clear to auscultation. Respiratory effort normal. Cardiovascular system: S1 & S2 heard, RRR. No JVD, murmurs, rubs, gallops or clicks. No pedal edema. Gastrointestinal system: Abdomen is nondistended, soft and nontender. No organomegaly or masses felt. Normal bowel sounds heard. Central nervous system: Alert and oriented. No focal neurological deficits. Extremities: Symmetric 5 x 5 power. Skin: No rashes, lesions or ulcers Psychiatry: Judgement and insight appear normal. Mood & affect appropriate.   Data Reviewed: I have personally reviewed following labs and imaging studies  CBC: Recent Labs  Lab 06/05/24 1034 06/06/24 1240 06/07/24 0536  WBC 14.5* 14.1* 12.2*  NEUTROABS 12.5*  --   --   HGB 12.9 12.3 11.8*  HCT 40.3 35.9* 35.2*  MCV 84.5 81.8 82.8  PLT 354 352 335   Basic Metabolic Panel: Recent Labs  Lab 06/05/24 1034 06/05/24 1433 06/06/24 1240 06/07/24 0536  NA 131*  --  135 133*  K 3.6  --  3.7 3.6  CL 96*  --  99 99  CO2 21*  --  25 24  GLUCOSE 201*  --  138* 118*  BUN 9  --  13 18  CREATININE 0.78  --  0.96 0.89  CALCIUM  8.7*  --  8.5* 8.5*  MG  --  1.8  --   --    GFR: Estimated Creatinine Clearance: 62.3 mL/min (by C-G formula based on SCr of 0.89 mg/dL). Liver Function Tests: Recent Labs  Lab 06/05/24 1034  AST 18  ALT 8  ALKPHOS 83  BILITOT 0.5  PROT 7.5  ALBUMIN 3.6   HbA1C: Recent Labs    06/05/24 1433  HGBA1C 5.9*   CBG: Recent Labs  Lab 06/06/24 0825 06/07/24 0800 06/07/24 1145  GLUCAP 130* 130* 142*   Lipid Profile: Recent Labs    06/05/24 1433  CHOL 196  HDL 69  LDLCALC 113*  TRIG 68  CHOLHDL 2.8   Sepsis  Labs: Recent Labs  Lab 06/06/24 1240  PROCALCITON <0.10   No results found for this or any previous visit (from the past 240 hours).   Radiology Studies: ECHOCARDIOGRAM COMPLETE Result Date: 06/06/2024    ECHOCARDIOGRAM REPORT   Patient Name:   Victoria Medina Date of Exam: 06/06/2024 Medical Rec #:  [id number]    Height:       60.0 in Accession #:    [id number]   Weight:       181.5 lb Date of Birth:  1961/01/06   BSA:          1.791 m Patient Age:    62 years     BP:           128/77 mmHg Patient Gender: F            HR:           96 bpm. Exam Location:  [facility] Procedure: 2D Echo, Cardiac Doppler and Color  Doppler (Both Spectral and Color            Flow Doppler were utilized during procedure). Indications:     CHF-acute diastolic I50.31  History:         Patient has prior history of Echocardiogram examinations, most                  recent 01/26/2020. Previous Myocardial Infarction, Stroke; Risk                  Factors:Dyslipidemia.  Sonographer:     Christopher Furnace Referring Phys:  [id number] [facility] Diagnosing Phys: Cara JONETTA Lovelace MD IMPRESSIONS  1. Left ventricular ejection fraction, by estimation, is 20 to 25%. The left ventricle has severely decreased function. The left ventricle demonstrates global hypokinesis. The left ventricular internal cavity size was moderately to severely dilated. There is moderate asymmetric left ventricular hypertrophy of the inferior and basal segments. Left ventricular diastolic parameters are consistent with Grade III diastolic dysfunction (restrictive).  2. Right ventricular systolic function is mildly reduced. The right ventricular size is mildly enlarged.  3. The mitral valve is grossly normal. Mild mitral valve regurgitation.  4. The aortic valve is normal in structure. Aortic valve regurgitation is not visualized. FINDINGS  Left Ventricle: Left ventricular ejection fraction, by estimation, is 20 to 25%. The left ventricle has severely decreased function. The left  ventricle demonstrates global hypokinesis. Strain was performed and the global longitudinal strain is indeterminate. The left ventricular internal cavity size was moderately to severely dilated. There is moderate asymmetric left ventricular hypertrophy of the inferior and basal segments. Left ventricular diastolic parameters are consistent with Grade III  diastolic dysfunction (restrictive). Right Ventricle: The right ventricular size is mildly enlarged. No increase in right ventricular wall thickness. Right ventricular systolic function is mildly reduced. Left Atrium: Left atrial size was normal in size. Right Atrium: Right atrial size was normal in size. Pericardium: There is no evidence of pericardial effusion. Mitral Valve: The mitral valve is grossly normal. Mild mitral valve regurgitation. Tricuspid Valve: The tricuspid valve is normal in structure. Tricuspid valve regurgitation is mild. Aortic Valve: The aortic valve is normal in structure. Aortic valve regurgitation is not visualized. Aortic valve mean gradient measures 2.0 mmHg. Aortic valve peak gradient measures 3.7 mmHg. Aortic valve area, by VTI measures 2.44 cm. Pulmonic Valve: The pulmonic valve was normal in structure. Pulmonic valve regurgitation is not visualized. Aorta: The ascending aorta was not well visualized. IAS/Shunts: No atrial level shunt detected by color flow Doppler. Additional Comments: 3D was performed not requiring image post processing on an independent workstation and was indeterminate.  LEFT VENTRICLE PLAX 2D LVIDd:         5.54 cm      Diastology LVIDs:         2.69 cm      LV e' medial:    3.04 cm/s LV PW:         1.49 cm      LV E/e' medial:  29.8 LV IVS:        0.84 cm      LV e' lateral:   6.67 cm/s LVOT diam:     2.00 cm      LV E/e' lateral: 13.6 LV SV:         47 LV SV Index:   26 LVOT Area:     3.14 cm LV IVRT:       131 msec  LV Volumes (MOD) LV  vol d, MOD A2C: 111.0 ml LV vol d, MOD A4C: 132.0 ml LV vol s, MOD A2C:  102.0 ml LV vol s, MOD A4C: 110.0 ml LV SV MOD A2C:     9.0 ml LV SV MOD A4C:     132.0 ml LV SV MOD BP:      15.3 ml RIGHT VENTRICLE RV Basal diam:  2.82 cm RV Mid diam:    2.55 cm RV S prime:     11.90 cm/s TAPSE (M-mode): 1.3 cm LEFT ATRIUM             Index        RIGHT ATRIUM           Index LA diam:        3.50 cm 1.95 cm/m   RA Area:     12.20 cm LA Vol (A2C):   26.5 ml 14.79 ml/m  RA Volume:   26.50 ml  14.79 ml/m LA Vol (A4C):   69.8 ml 38.94 ml/m LA Biplane Vol: 24.5 ml 13.68 ml/m  AORTIC VALVE AV Area (Vmax):    2.59 cm AV Area (Vmean):   2.38 cm AV Area (VTI):     2.44 cm AV Vmax:           96.75 cm/s AV Vmean:          67.650 cm/s AV VTI:            0.192 m AV Peak Grad:      3.7 mmHg AV Mean Grad:      2.0 mmHg LVOT Vmax:         79.80 cm/s LVOT Vmean:        51.200 cm/s LVOT VTI:          0.149 m LVOT/AV VTI ratio: 0.78  AORTA Ao Root diam: 2.90 cm MITRAL VALVE               TRICUSPID VALVE MV Area (PHT): 6.07 cm    TR Peak grad:   8.9 mmHg MV Decel Time: 125 msec    TR Vmax:        149.00 cm/s MV E velocity: 90.70 cm/s MV A velocity: 66.50 cm/s  SHUNTS MV E/A ratio:  1.36        Systemic VTI:  0.15 m                            Systemic Diam: 2.00 cm Cara JONETTA Lovelace MD Electronically signed by Cara JONETTA Lovelace MD Signature Date/Time: 06/06/2024/5:02:03 PM    Final    Scheduled Meds:  amLODipine  2.5 mg Oral Daily   enoxaparin  (LOVENOX ) injection  40 mg Subcutaneous Q24H   isosorbide mononitrate  60 mg Oral Daily   levothyroxine   25 mcg Oral QAC breakfast   losartan   100 mg Oral Daily   OLANZapine   5 mg Oral Daily   ranolazine  500 mg Oral BID   rosuvastatin  5 mg Oral Once per day on Tuesday Thursday Saturday   senna-docusate  2 tablet Oral QHS   sodium chloride  flush  3 mL Intravenous Q12H   Continuous Infusions:  azithromycin (ZITHROMAX) 500 mg in sodium chloride  0.9 % 250 mL IVPB 500 mg (06/07/24 1443)   cefTRIAXone  (ROCEPHIN )  IV 2 g (06/07/24 1409)    LOS: 2 days    Time spent: 35 minutes  Dr. Sherre [facility] If 7PM-7AM, please contact night-coverage 06/07/2024, 3:57  PM

## 2024-06-07 NOTE — Evaluation (Signed)
 Occupational Therapy Evaluation Patient Details Name: Victoria Medina MRN: [id number] DOB: [birth year] Today's Date: 06/07/2024   History of Present Illness   Victoria Medina is a 63 y.o. year old female with medical history of hypertension, hyperlipidemia, hypothyroidism CAD status post stent placement, history of stroke with right-sided deficits, blindness, chronic urinary retention status post suprapubic catheter presented to the ED after becoming hypoxic after having a choking spell at her facility.  She resides at [facility].  Pt is blind     Clinical Impressions Pt seen for OT evaluation this date.  She lives at [facility] in long term care and requires daily assistance with all basic self care tasks.  She operates from a wheelchair at baseline and can perform wheelchair to toilet transfers with assist. Pt appears at her baseline level of care and will likely discharge back to [facility] in the next day per MD.  No skilled needs at this time for OT.     If plan is discharge home, recommend the following:   A lot of help with walking and/or transfers;A lot of help with bathing/dressing/bathroom;Assistance with feeding;Assist for transportation     Functional Status Assessment   Patient has not had a recent decline in their functional status     Equipment Recommendations         Recommendations for Other Services         Precautions/Restrictions   Precautions Precautions: Fall Restrictions Weight Bearing Restrictions Per Provider Order: No     Mobility Bed Mobility               General bed mobility comments: See PT note for bed mobility, pt was up to the chair on arrival    Transfers Overall transfer level: Needs assistance Equipment used: 1 person hand held assist Transfers: Sit to/from Stand Sit to Stand: Min assist                  Balance Overall balance assessment: Needs assistance Sitting-balance support: Feet supported Sitting  balance-Leahy Scale: Good                                     ADL either performed or assessed with clinical judgement   ADL Overall ADL's : At baseline                                       General ADL Comments: Pt appears to be at her baseline level of function.  She requires assistance from staff at long term care facility at [facility].  She is able to assist with bathing and dressing minimally.  She is blind.  She is able to feed herself finger foods but not use utensils.  She is able to complete toilet transfers with assist from wheelchair level .  She does not appear to have any new deficits.     Vision Baseline Vision/History: 2 Legally blind       Perception         Praxis         Pertinent Vitals/Pain Pain Assessment Pain Assessment: No/denies pain     Extremity/Trunk Assessment Upper Extremity Assessment Upper Extremity Assessment: Right hand dominant;LUE deficits/detail LUE Deficits / Details: Left sided hemiplegia with left wrist in flexion and unable to functionally use in bilateral tasks.  Lower Extremity Assessment Lower Extremity Assessment: Defer to PT evaluation LLE Deficits / Details: L side hemaplegia weakness and extension spasm with standing intermittently   Cervical / Trunk Assessment Cervical / Trunk Assessment: Kyphotic   Communication Communication Communication: No apparent difficulties   Cognition Arousal: Alert Behavior During Therapy: WFL for tasks assessed/performed               OT - Cognition Comments: Pt was able to state the month, year and location as hospital [facility].                 Following commands: Intact       Cueing  General Comments   Cueing Techniques: Verbal cues      Exercises     Shoulder Instructions      Home Living Family/patient expects to be discharged to:: Skilled nursing facility                                 Additional Comments:  Long-term resident at [facility], participating with restorative mobility services      Prior Functioning/Environment Prior Level of Function : Needs assist  Cognitive Assist : Mobility (cognitive) Mobility (Cognitive): Set up cues   Physical Assist : Mobility (physical) Mobility (physical): Bed mobility;Transfers;Gait;Stairs   Mobility Comments: WC level as primary mobility at baseline; does endorse desire and ability for ambulation trails and participates with mobilty program with ambulation ADLs Comments: Pt requires assistance for bathing, dressing and grooming tasks at baseline. She is able to feed herself finger foods but requires assistance with any foods which require utensil use.  She is able to complete toilet transfers with assist.  At her baseline she operates from a wheelchair level.  History of CVA with left side affected and unable to use left hand for functional tasks.  She is right handed.    OT Problem List: Decreased strength;Impaired balance (sitting and/or standing)   OT Treatment/Interventions:        OT Goals(Current goals can be found in the care plan section)   Acute Rehab OT Goals Patient Stated Goal: to go back to [facility] OT Goal Formulation: With patient Time For Goal Achievement: 06/21/24 Potential to Achieve Goals: Good   OT Frequency:       Co-evaluation              AM-PAC OT 6 Clicks Daily Activity     Outcome Measure Help from another person eating meals?: A Little Help from another person taking care of personal grooming?: A Little Help from another person toileting, which includes using toliet, bedpan, or urinal?: A Little Help from another person bathing (including washing, rinsing, drying)?: A Lot Help from another person to put on and taking off regular upper body clothing?: A Little Help from another person to put on and taking off regular lower body clothing?: A Lot 6 Click Score: 16   End of Session    Activity  Tolerance: Patient tolerated treatment well Patient left: in chair;with chair alarm set;with nursing/sitter in room  OT Visit Diagnosis: Unsteadiness on feet (R26.81);Muscle weakness (generalized) (M62.81)                Time: 8575-8562 OT Time Calculation (min): 13 min Charges:  OT General Charges $OT Visit: 1 Visit Aileena Iglesia T Fredrika Canby, OTR/L, CLT Alain Deschene 06/07/2024, 3:25 PM

## 2024-06-08 DIAGNOSIS — J9601 Acute respiratory failure with hypoxia: Secondary | ICD-10-CM | POA: Diagnosis not present

## 2024-06-08 DIAGNOSIS — K5900 Constipation, unspecified: Secondary | ICD-10-CM

## 2024-06-08 DIAGNOSIS — H543 Unqualified visual loss, both eyes: Secondary | ICD-10-CM | POA: Insufficient documentation

## 2024-06-08 LAB — CBC
HCT: 37.1 % (ref 36.0–46.0)
Hemoglobin: 12.3 g/dL (ref 12.0–15.0)
MCH: 27.8 pg (ref 26.0–34.0)
MCHC: 33.2 g/dL (ref 30.0–36.0)
MCV: 83.7 fL (ref 80.0–100.0)
Platelets: 301 K/uL (ref 150–400)
RBC: 4.43 MIL/uL (ref 3.87–5.11)
RDW: 13.8 % (ref 11.5–15.5)
WBC: 11.5 K/uL — ABNORMAL HIGH (ref 4.0–10.5)
nRBC: 0 % (ref 0.0–0.2)

## 2024-06-08 LAB — GLUCOSE, CAPILLARY
Glucose-Capillary: 125 mg/dL — ABNORMAL HIGH (ref 70–99)
Glucose-Capillary: 141 mg/dL — ABNORMAL HIGH (ref 70–99)
Glucose-Capillary: 141 mg/dL — ABNORMAL HIGH (ref 70–99)

## 2024-06-08 MED ORDER — DIAZEPAM 5 MG PO TABS: 10.0000 mg | ORAL_TABLET | Freq: Four times a day (QID) | ORAL | Status: DC

## 2024-06-08 MED ORDER — AZITHROMYCIN 500 MG PO TABS
500.0000 mg | ORAL_TABLET | Freq: Every day | ORAL | 0 refills | Status: AC
Start: 2024-06-08 — End: 2024-06-11

## 2024-06-08 MED ORDER — DIAZEPAM 5 MG PO TABS
10.0000 mg | ORAL_TABLET | Freq: Four times a day (QID) | ORAL | Status: DC
  Administered 2024-06-08 (×2): 10 mg via ORAL
  Filled 2024-06-08 (×2): qty 2

## 2024-06-08 MED ORDER — CEFDINIR 300 MG PO CAPS
300.0000 mg | ORAL_CAPSULE | Freq: Two times a day (BID) | ORAL | 0 refills | Status: AC
Start: 2024-06-08 — End: 2024-06-11

## 2024-06-08 MED ORDER — FLEET ENEMA RE ENEM
1.0000 | ENEMA | Freq: Every day | RECTAL | Status: AC | PRN
  Administered 2024-06-08: 1 via RECTAL

## 2024-06-08 NOTE — Plan of Care (Signed)

## 2024-06-08 NOTE — Progress Notes (Signed)
 EMS arrived to [facility], Nurse was unable to give report to receiving nurse at [facility]. Discharge instruction in the packet. Nurse was not able to give report, the phone went to voice mail.EMS personnel will give my number to the nurse was they arrive and have the to call me back.

## 2024-06-08 NOTE — Assessment & Plan Note (Signed)
 At baseline

## 2024-06-08 NOTE — Assessment & Plan Note (Signed)
>>  ASSESSMENT AND PLAN FOR BLINDNESS OF BOTH EYES WRITTEN ON 06/08/2024 12:35 PM BY COX, AMY N, DO  At baseline

## 2024-06-08 NOTE — Assessment & Plan Note (Signed)
 Patient had MiraLAX , senna docusate on 06/07/2024 No BM since admission Patient requesting enema, soapsuds Subset enema one-time ordered

## 2024-06-08 NOTE — Discharge Summary (Addendum)
 Physician Discharge Summary   Patient: Victoria Medina MRN: [id number] DOB: 1961-06-04  Admit date:     06/05/2024  Discharge date: 06/08/24  Discharge Physician: Dr. Sherre   PCP: [name], DO   Recommendations at discharge:   Cefdinir  300 mg p.o. twice daily, 3-day course prescribed.  Azithromycin 500 mg p.o. daily, 3-day course ordered.  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 *  Hospital Course:  Ms. Victoria Medina is a 63 year old female with history of CAD, stroke, flaccid hemiaplasia, anxiety, depression, hyperlipidemia, hypothyroid, hypertension, OSA, morbid obesity, blind bilaterally, chronic suprapubic catheter who follows up with outpatient urology monthly, who presents for chief concerns of shortness of breath from [facility] after eating breakfast.  Patient initially had reports of SpO2 of 69% on room air with wheezing bilaterally.  Facility gave patient 1 dose of nitroglycerin .  SpO2 with 89% on nonrebreather, and EMS gave 1 dose of albuterol and Solu-Medrol  125 mg.  In the ED serum sodium was 131, potassium 3.6, chloride 96, bicarb 21, nonfasting blood glucose 201, BUN of 9, serum creatinine of 0.78, eGFR > 60.  WBC 14.5, hemoglobin 12.9, platelets of 354.  ED treatment: DuoNebs one-time treatment, furosemide 60 mg IV one-time dose, azithromycin 500 mg, ceftriaxone  2 g IV  one-time dose.  06/05/2024: Patient admitted to hospitalist service for acute hypoxic respiratory secondary to aspiration pneumonia versus pneumonitis and mild CHF exacerbation.  11/7: Day 2 of antibiotics. 11/8: day 3 of abx. Leukocytosis improved. 11/9: Day 4 of antibiotic. Pt endorsed constipation, no BM since hospitalization. Soap sud enema ordered, once.  Fleet enema ordered one-time.  Patient had a blowout.  Patient discharged to [facility]  Facility has accepted the patient.  Oral antibiotic has been prescribed.  Cefdinir  300 mg p.o. twice daily, 3-day course ordered; azithromycin 500 mg p.o. daily, 3 tablets ordered to complete community-acquired pneumonia course.  Assessment and Plan:  * Acute hypoxic respiratory failure (HCC) Continuous pulse oximetry Suspect secondary to multifocal pneumonia however BNP and troponins were elevated, therefore heart failure cannot be excluded at this time Complete echo ordered by admitting provider Continue azithromycin 500 mg IV daily, ceftriaxone  2 g IV daily to complete a 5-day course Maintain SpO2 greater than 92%  Constipation Patient had MiraLAX , senna docusate on 06/07/2024 No BM since admission Patient requesting enema, soapsuds Subset enema one-time ordered  Essential hypertension Amlodipine 2.5 mg daily, isosorbide mononitrate 60 mg daily, losartan  100 mg daily were resumed Hydralazine  5 mg IV every 6 hours as needed for SBP greater 170, 5 days ordered  DNR (do not resuscitate) ACP reviewed  HLD (hyperlipidemia) Home rosuvastatin 5 mg per home dosing resumed  Obstructive sleep apnea (adult) (pediatric) CPAP at bedtime  Obesity, morbid (HCC) This meets criteria for morbid obesity based on the presence of 1 or more chronic comorbidities. Patient has CAD and BMI of 35.45. This complicates overall care and prognosis.  GAD (generalized anxiety disorder) PDMP reviewed Currently no prescription Last diazepam  10 mg tablet, 8  tablet, 2-day supply was written on 09/19/2022 and filled on 11/26/2022  Hypothyroidism Levothyroxine  25 mcg daily before breakfast resume  Blindness of both eyes At baseline  Leukocytosis Suspect secondary to multi lobar pneumonia, treatment per above Recheck CBC in a.m. Continue to downtrend as appropriate.  Pain control - [place]  Controlled Substance Reporting System database was reviewed. and patient was instructed, not to drive, operate heavy machinery, perform activities at heights, swimming or participation in water activities or provide baby-sitting services while on Pain, Sleep and Anxiety Medications; until their outpatient Physician has advised to do so again. Also recommended to not to take more than prescribed Pain, Sleep and Anxiety Medications.  Consultants: TOC, PT, OT Procedures performed: None indicated Disposition: Assisted living Diet recommendation:  Regular diet DISCHARGE MEDICATION: Allergies as of 06/08/2024       Reactions   Levaquin [levofloxacin In D5w] Anaphylaxis, Swelling   Iodinated Contrast Media Itching   Severe itching   Other    Dust mites and opiates (all of them)   Latex Dermatitis        Medication List     TAKE these medications    amLODipine 2.5 MG tablet Commonly known as: NORVASC Take 2.5 mg by mouth daily.   aspirin  EC 81 MG tablet Take 81 mg by mouth daily.   azithromycin 500 MG tablet Commonly known as: Zithromax Take 1 tablet (500 mg total) by mouth daily for 3 days.   cefdinir  300 MG capsule Commonly known as: OMNICEF  Take 1 capsule (300 mg total) by mouth 2 (two) times daily for 3 days.   COUGH/SORE THROAT LOZENGES MT Use as directed 1 drop in the mouth or throat every 4 (four) hours as needed.   diazepam  10 MG tablet Commonly known as: VALIUM  TAKE 1 TABLET BY MOUTH FOUR TIMES A DAY   fluticasone  50 MCG/ACT nasal spray Commonly known as: FLONASE  Place 1 spray into both nostrils 2 (two) times daily.    Gemtesa  75 MG Tabs Generic drug: Vibegron  Take 75 mg by mouth daily.   ibuprofen  800 MG tablet Commonly known as: ADVIL  Take 800 mg by mouth 2 (two) times daily as needed for mild pain (pain score 1-3) or moderate pain (pain score 4-6).   isosorbide mononitrate 60 MG 24 hr tablet Commonly known as: IMDUR Take 60 mg by mouth daily.   lansoprazole 15 MG capsule Commonly known as: PREVACID Take 15 mg by mouth 2 (two) times daily before a meal.   levothyroxine  25 MCG tablet Commonly known as: SYNTHROID  Take 25 mcg by mouth daily before breakfast.   losartan  100 MG tablet Commonly known as: COZAAR  Take 100 mg by mouth daily.   magnesium  hydroxide 400 MG/5ML suspension Commonly known as: MILK OF MAGNESIA Take 5 mLs by mouth every 6 (six) hours as needed for mild constipation.   nitroGLYCERIN  0.4 MG SL tablet Commonly known as: NITROSTAT  Place 0.4 mg under the tongue every 5 (five) minutes x 3 doses as needed for chest pain.   nystatin  powder Apply 1 Application topically 3 (three) times daily.   OLANZapine  5 MG tablet Commonly known as: ZYPREXA  Take 5 mg by mouth daily.   ondansetron  4 MG disintegrating tablet Commonly known as: ZOFRAN -ODT Take 4 mg by mouth every 4 (four) hours as needed for nausea or vomiting.   polyethylene glycol 17 g packet Commonly known as: MIRALAX  / GLYCOLAX  Take 17  g by mouth daily.   promethazine  12.5 MG suppository Commonly known as: PHENERGAN  Place 1 suppository (12.5 mg total) rectally every 6 (six) hours as needed for nausea or vomiting.   ranolazine 500 MG 12 hr tablet Commonly known as: RANEXA Take 500 mg by mouth 2 (two) times daily.   rosuvastatin 5 MG tablet Commonly known as: CRESTOR Take 5 mg by mouth daily. Every Tuesday,Thursday,Sat   senna-docusate 8.6-50 MG tablet Commonly known as: Senokot-S Take 2 tablets by mouth at bedtime.   Vitamin D3 1.25 MG (50000 UT) Caps Take 1 capsule by mouth once a week.  every Mon for  Supplement       Discharge Exam: Filed Weights   06/05/24 1035 06/06/24 0615 06/08/24 0429  Weight: 88 kg 82.3 kg 82.5 kg   Physical Exam Constitutional:      Appearance: She is well-developed. She is obese.  HENT:     Head: Normocephalic and atraumatic.     Mouth/Throat:     Mouth: Mucous membranes are moist.  Eyes:     Extraocular Movements: Extraocular movements intact.     Pupils: Pupils are equal, round, and reactive to light.     Comments: Patient is blind bilaterally at baseline.  Cardiovascular:     Rate and Rhythm: Normal rate and regular rhythm.  Pulmonary:     Effort: Pulmonary effort is normal.     Breath sounds: Normal breath sounds.  Abdominal:     Comments: Suprapubic catheter in place.  Musculoskeletal:        General: Normal range of motion.     Cervical back: Normal range of motion.     Comments: Bilateral upper extremity are contracted at baseline.  Skin:    General: Skin is warm and dry.     Capillary Refill: Capillary refill takes less than 2 seconds.  Neurological:     Mental Status: She is alert and oriented to person, place, and time.  Psychiatric:        Mood and Affect: Mood normal.        Behavior: Behavior normal.   Condition at discharge: good  The results of significant diagnostics from this hospitalization (including imaging, microbiology, ancillary and laboratory) are listed below for reference.   Imaging Studies: ECHOCARDIOGRAM COMPLETE Result Date: 06/06/2024    ECHOCARDIOGRAM REPORT   Patient Name:   Victoria Medina Date of Exam: 06/06/2024 Medical Rec #:  [id number]    Height:       60.0 in Accession #:    [id number]   Weight:       181.5 lb Date of Birth:  March 31, 1961   BSA:          1.791 m Patient Age:    62 years     BP:           128/77 mmHg Patient Gender: F            HR:           96 bpm. Exam Location:  [facility] Procedure: 2D Echo, Cardiac Doppler and Color Doppler (Both Spectral and Color            Flow Doppler were utilized during  procedure). Indications:     CHF-acute diastolic I50.31  History:         Patient has prior history of Echocardiogram examinations, most                  recent 01/26/2020. Previous Myocardial Infarction, Stroke; Risk  Factors:Dyslipidemia.  Sonographer:     Christopher Furnace Referring Phys:  [id number] [facility] Diagnosing Phys: Cara JONETTA Lovelace MD IMPRESSIONS  1. Left ventricular ejection fraction, by estimation, is 20 to 25%. The left ventricle has severely decreased function. The left ventricle demonstrates global hypokinesis. The left ventricular internal cavity size was moderately to severely dilated. There is moderate asymmetric left ventricular hypertrophy of the inferior and basal segments. Left ventricular diastolic parameters are consistent with Grade III diastolic dysfunction (restrictive).  2. Right ventricular systolic function is mildly reduced. The right ventricular size is mildly enlarged.  3. The mitral valve is grossly normal. Mild mitral valve regurgitation.  4. The aortic valve is normal in structure. Aortic valve regurgitation is not visualized. FINDINGS  Left Ventricle: Left ventricular ejection fraction, by estimation, is 20 to 25%. The left ventricle has severely decreased function. The left ventricle demonstrates global hypokinesis. Strain was performed and the global longitudinal strain is indeterminate. The left ventricular internal cavity size was moderately to severely dilated. There is moderate asymmetric left ventricular hypertrophy of the inferior and basal segments. Left ventricular diastolic parameters are consistent with Grade III  diastolic dysfunction (restrictive). Right Ventricle: The right ventricular size is mildly enlarged. No increase in right ventricular wall thickness. Right ventricular systolic function is mildly reduced. Left Atrium: Left atrial size was normal in size. Right Atrium: Right atrial size was normal in size. Pericardium: There is no evidence of  pericardial effusion. Mitral Valve: The mitral valve is grossly normal. Mild mitral valve regurgitation. Tricuspid Valve: The tricuspid valve is normal in structure. Tricuspid valve regurgitation is mild. Aortic Valve: The aortic valve is normal in structure. Aortic valve regurgitation is not visualized. Aortic valve mean gradient measures 2.0 mmHg. Aortic valve peak gradient measures 3.7 mmHg. Aortic valve area, by VTI measures 2.44 cm. Pulmonic Valve: The pulmonic valve was normal in structure. Pulmonic valve regurgitation is not visualized. Aorta: The ascending aorta was not well visualized. IAS/Shunts: No atrial level shunt detected by color flow Doppler. Additional Comments: 3D was performed not requiring image post processing on an independent workstation and was indeterminate.  LEFT VENTRICLE PLAX 2D LVIDd:         5.54 cm      Diastology LVIDs:         2.69 cm      LV e' medial:    3.04 cm/s LV PW:         1.49 cm      LV E/e' medial:  29.8 LV IVS:        0.84 cm      LV e' lateral:   6.67 cm/s LVOT diam:     2.00 cm      LV E/e' lateral: 13.6 LV SV:         47 LV SV Index:   26 LVOT Area:     3.14 cm LV IVRT:       131 msec  LV Volumes (MOD) LV vol d, MOD A2C: 111.0 ml LV vol d, MOD A4C: 132.0 ml LV vol s, MOD A2C: 102.0 ml LV vol s, MOD A4C: 110.0 ml LV SV MOD A2C:     9.0 ml LV SV MOD A4C:     132.0 ml LV SV MOD BP:      15.3 ml RIGHT VENTRICLE RV Basal diam:  2.82 cm RV Mid diam:    2.55 cm RV S prime:     11.90 cm/s TAPSE (M-mode): 1.3 cm LEFT ATRIUM  Index        RIGHT ATRIUM           Index LA diam:        3.50 cm 1.95 cm/m   RA Area:     12.20 cm LA Vol (A2C):   26.5 ml 14.79 ml/m  RA Volume:   26.50 ml  14.79 ml/m LA Vol (A4C):   69.8 ml 38.94 ml/m LA Biplane Vol: 24.5 ml 13.68 ml/m  AORTIC VALVE AV Area (Vmax):    2.59 cm AV Area (Vmean):   2.38 cm AV Area (VTI):     2.44 cm AV Vmax:           96.75 cm/s AV Vmean:          67.650 cm/s AV VTI:            0.192 m AV Peak Grad:       3.7 mmHg AV Mean Grad:      2.0 mmHg LVOT Vmax:         79.80 cm/s LVOT Vmean:        51.200 cm/s LVOT VTI:          0.149 m LVOT/AV VTI ratio: 0.78  AORTA Ao Root diam: 2.90 cm MITRAL VALVE               TRICUSPID VALVE MV Area (PHT): 6.07 cm    TR Peak grad:   8.9 mmHg MV Decel Time: 125 msec    TR Vmax:        149.00 cm/s MV E velocity: 90.70 cm/s MV A velocity: 66.50 cm/s  SHUNTS MV E/A ratio:  1.36        Systemic VTI:  0.15 m                            Systemic Diam: 2.00 cm Cara JONETTA Lovelace MD Electronically signed by Cara JONETTA Lovelace MD Signature Date/Time: 06/06/2024/5:02:03 PM    Final    DG Chest Portable 1 View Result Date: 06/05/2024 EXAM: 1 VIEW(S) XRAY OF THE CHEST 06/05/2024 10:57:00 AM COMPARISON: 10/04/2023 CLINICAL HISTORY: aspirated food, feeling SOB. wheezing on exam FINDINGS: LUNGS AND PLEURA: New diffuse interstitial and patchy lung opacities. Relative sparing of the left upper lobe. Trace right pleural effusion. No pneumothorax. HEART AND MEDIASTINUM: Cardiomegaly. Aortic atherosclerotic calcification. BONES AND SOFT TISSUES: No acute osseous abnormality. IMPRESSION: 1. New diffuse interstitial airspace opacities relatively diffusely. Favor multifocal infection or aspiration over pulmonary edema. 2. Trace right pleural effusion. 3. Aortic atherosclerosis (icd10-i70.0). Electronically signed by: Rockey Kilts MD 06/05/2024 11:54 AM EST RP Workstation: [id number]   Microbiology: Results for orders placed or performed during the hospital encounter of 09/13/22  Culture, Urine (Do not remove urinary catheter, catheter placed by urology or difficult to place)     Status: Abnormal   Collection Time: 09/13/22 10:16 AM   Specimen: Urine, Random  Result Value Ref Range Status   Specimen Description   Final    URINE, RANDOM Performed at [facility], [address], [place], [place] [address]    Special Requests   Final    Normal Performed at [facility], [address] [place], [place] [address]    Culture (A)  Final    >=100,000 COLONIES/mL PSEUDOMONAS AERUGINOSA >=100,000 COLONIES/mL STAPHYLOCOCCUS AUREUS    Report Status 09/16/2022 FINAL  Final   Organism ID, Bacteria PSEUDOMONAS AERUGINOSA (A)  Final  Organism ID, Bacteria STAPHYLOCOCCUS AUREUS (A)  Final      Susceptibility   Pseudomonas aeruginosa - MIC*    CEFTAZIDIME 4 SENSITIVE Sensitive     CIPROFLOXACIN <=0.25 SENSITIVE Sensitive     GENTAMICIN 4 SENSITIVE Sensitive     IMIPENEM 2 SENSITIVE Sensitive     PIP/TAZO 8 SENSITIVE Sensitive     CEFEPIME  2 SENSITIVE Sensitive     * >=100,000 COLONIES/mL PSEUDOMONAS AERUGINOSA   Staphylococcus aureus - MIC*    CIPROFLOXACIN >=8 RESISTANT Resistant     GENTAMICIN <=0.5 SENSITIVE Sensitive     NITROFURANTOIN  32 SENSITIVE Sensitive     OXACILLIN >=4 RESISTANT Resistant     TETRACYCLINE <=1 SENSITIVE Sensitive     VANCOMYCIN <=0.5 SENSITIVE Sensitive     TRIMETH /SULFA  <=10 SENSITIVE Sensitive     CLINDAMYCIN <=0.25 SENSITIVE Sensitive     RIFAMPIN <=0.5 SENSITIVE Sensitive     Inducible Clindamycin NEGATIVE Sensitive     * >=100,000 COLONIES/mL STAPHYLOCOCCUS AUREUS  Resp Panel by RT-PCR (Flu A&B, Covid) Anterior Nasal Swab     Status: None   Collection Time: 09/13/22 10:16 AM   Specimen: Anterior Nasal Swab  Result Value Ref Range Status   SARS Coronavirus 2 by RT PCR NEGATIVE NEGATIVE Final    Comment: (NOTE) SARS-CoV-2 target nucleic acids are NOT DETECTED.  The SARS-CoV-2 RNA is generally detectable in upper respiratory specimens during the acute phase of infection. The lowest concentration of SARS-CoV-2 viral copies this assay can detect is 138 copies/mL. A negative result does not preclude SARS-Cov-2 infection and should not be used as the sole basis for treatment or other patient management decisions. A negative result may occur with  improper specimen collection/handling, submission of specimen other than  nasopharyngeal swab, presence of viral mutation(s) within the areas targeted by this assay, and inadequate number of viral copies(<138 copies/mL). A negative result must be combined with clinical observations, patient history, and epidemiological information. The expected result is Negative.  Fact Sheet for Patients:  bloggercourse.com  Fact Sheet for Healthcare Providers:  seriousbroker.it  This test is no t yet approved or cleared by the [place]  FDA and  has been authorized for detection and/or diagnosis of SARS-CoV-2 by FDA under an Emergency Use Authorization (EUA). This EUA will remain  in effect (meaning this test can be used) for the duration of the COVID-19 declaration under Section 564(b)(1) of the Act, 21 U.S.C.section 360bbb-3(b)(1), unless the authorization is terminated  or revoked sooner.       Influenza A by PCR NEGATIVE NEGATIVE Final   Influenza B by PCR NEGATIVE NEGATIVE Final    Comment: (NOTE) The Xpert Xpress SARS-CoV-2/FLU/RSV plus assay is intended as an aid in the diagnosis of influenza from Nasopharyngeal swab specimens and should not be used as a sole basis for treatment. Nasal washings and aspirates are unacceptable for Xpert Xpress SARS-CoV-2/FLU/RSV testing.  Fact Sheet for Patients: bloggercourse.com  Fact Sheet for Healthcare Providers: seriousbroker.it  This test is not yet approved or cleared by the [place]  FDA and has been authorized for detection and/or diagnosis of SARS-CoV-2 by FDA under an Emergency Use Authorization (EUA). This EUA will remain in effect (meaning this test can be used) for the duration of the COVID-19 declaration under Section 564(b)(1) of the Act, 21 U.S.C. section 360bbb-3(b)(1), unless the authorization is terminated or revoked.  Performed at [facility], [address] [place], [place]  [address]   MRSA Next Gen by PCR, Nasal  Status: Abnormal   Collection Time: 09/14/22  2:56 PM   Specimen: Nasal Mucosa; Nasal Swab  Result Value Ref Range Status   MRSA by PCR Next Gen DETECTED (A) NOT DETECTED Final    Comment: RESULT CALLED TO, READ BACK BY AND VERIFIED WITH: C/ADAYENI AJIBADE 09/14/22 1700 SJL (NOTE) The GeneXpert MRSA Assay (FDA approved for NASAL specimens only), is one component of a comprehensive MRSA colonization surveillance program. It is not intended to diagnose MRSA infection nor to guide or monitor treatment for MRSA infections. Test performance is not FDA approved in patients less than 29 years old. Performed at [facility], [address] [place], [place] [address]    Labs: CBC: Recent Labs  Lab 06/05/24 1034 06/06/24 1240 06/07/24 0536 06/08/24 0834  WBC 14.5* 14.1* 12.2* 11.5*  NEUTROABS 12.5*  --   --   --   HGB 12.9 12.3 11.8* 12.3  HCT 40.3 35.9* 35.2* 37.1  MCV 84.5 81.8 82.8 83.7  PLT 354 352 335 301   Basic Metabolic Panel: Recent Labs  Lab 06/05/24 1034 06/05/24 1433 06/06/24 1240 06/07/24 0536  NA 131*  --  135 133*  K 3.6  --  3.7 3.6  CL 96*  --  99 99  CO2 21*  --  25 24  GLUCOSE 201*  --  138* 118*  BUN 9  --  13 18  CREATININE 0.78  --  0.96 0.89  CALCIUM  8.7*  --  8.5* 8.5*  MG  --  1.8  --   --    Liver Function Tests: Recent Labs  Lab 06/05/24 1034  AST 18  ALT 8  ALKPHOS 83  BILITOT 0.5  PROT 7.5  ALBUMIN 3.6   CBG: Recent Labs  Lab 06/07/24 1145 06/07/24 2135 06/08/24 0617 06/08/24 0823 06/08/24 1055  GLUCAP 142* 129* 125* 141* 141*   Discharge time spent: greater than 30 minutes.  Signed: Dr. Sherre [facility] 06/08/2024

## 2024-06-08 NOTE — TOC Transition Note (Signed)
 Transition of Care [facility]) - Discharge Note   Patient Details  Name: Victoria Medina MRN: [id number] Date of Birth: [birth year]  [facility] CM/SW Contact:  Marinda Cooks, RN Phone Number: 06/08/2024, 1:38 PM   Clinical Narrative:    This CM updated by covering MD pt medically cleared to dc today and has active DC order . This CM spoke with Lana weekend Admission  Nurse / liaison at [facility] and confirmed pt can return to SNF to her LTC bed rm 405 . DC transportation confirmed for pt with [facility] team updated . No additional DC needs requested by medical team or identified by CM at this time .     Final next level of care: Skilled Nursing Facility Barriers to Discharge: No Barriers Identified   Patient Goals and CMS Choice            Discharge Placement              Patient chooses bed at: [facility] Patient to be transferred to facility by: Ambulance   Patient and family notified of of transfer: 06/08/24  Discharge Plan and Services Additional resources added to the After Visit Summary for                                       Social Drivers of Health (SDOH) Interventions SDOH Screenings   Food Insecurity: No Food Insecurity (06/05/2024)  Housing: Unknown (06/05/2024)  Transportation Needs: No Transportation Needs (06/05/2024)  Utilities: Not At Risk (06/05/2024)  Depression (PHQ2-9): Low Risk  (05/25/2024)  Tobacco Use: Low Risk  (06/06/2024)     Readmission Risk Interventions     No data to display

## 2024-06-09 ENCOUNTER — Encounter: Payer: Self-pay | Admitting: Orthopedic Surgery

## 2024-06-09 ENCOUNTER — Non-Acute Institutional Stay (SKILLED_NURSING_FACILITY): Payer: Self-pay | Admitting: Orthopedic Surgery

## 2024-06-09 DIAGNOSIS — Z6835 Body mass index (BMI) 35.0-35.9, adult: Secondary | ICD-10-CM

## 2024-06-09 DIAGNOSIS — J9601 Acute respiratory failure with hypoxia: Secondary | ICD-10-CM | POA: Diagnosis not present

## 2024-06-09 DIAGNOSIS — I10 Essential (primary) hypertension: Secondary | ICD-10-CM

## 2024-06-09 DIAGNOSIS — E66812 Obesity, class 2: Secondary | ICD-10-CM

## 2024-06-09 DIAGNOSIS — Z8673 Personal history of transient ischemic attack (TIA), and cerebral infarction without residual deficits: Secondary | ICD-10-CM

## 2024-06-09 DIAGNOSIS — I5020 Unspecified systolic (congestive) heart failure: Secondary | ICD-10-CM

## 2024-06-09 DIAGNOSIS — Z9359 Other cystostomy status: Secondary | ICD-10-CM

## 2024-06-09 DIAGNOSIS — K5901 Slow transit constipation: Secondary | ICD-10-CM

## 2024-06-09 LAB — CBC AND DIFFERENTIAL
HCT: 37 (ref 36–46)
Hemoglobin: 12 (ref 12.0–16.0)
Neutrophils Absolute: 9817
Platelets: 307 K/uL (ref 150–400)
WBC: 12.9

## 2024-06-09 LAB — CBC: RBC: 4.33 (ref 3.87–5.11)

## 2024-06-09 NOTE — Progress Notes (Unsigned)
 Location:  Other [facility] Room Number: Learta Beagle [id number] Place of Service:  SNF (31) Provider:  Greig Gil PIETY  ERE:Ryzw, [name], Victoria Medina  Patient Care Team: Laurence [name], Victoria Medina as PCP - General (Internal Medicine) [name], MD as PCP - Cardiology (Cardiology)  Extended Emergency Contact Information Primary Emergency Contact: Kimbell (POA),[name]  [place] Home Phone: [phone] Mobile Phone: [phone] Relation: Sister Secondary Emergency Contact: [name] Address: [id number]          [place], [place] [address] [place] Home Phone: [phone] Relation: Mother  Code Status:  DNR Goals of care: Advanced Directive information    06/05/2024   10:37 AM  Advanced Directives  Does Patient Have a Medical Advance Directive? Yes  Type of Advance Directive Out of facility DNR (pink MOST or yellow form)     Chief Complaint  Patient presents with   Hospitalization Follow-up    Hospital Follow up    HPI:  Pt is a 63 y.o. female seen today for f/u s/p 11/06-11/09 due to acute respiratory failure.   She currently resides on the skilled nursing unit at [facility]. PMH: NSTEMI, CAD, DVT, PE, HTN, HFrEF, migraines, OSA, GERD, hypothyroidism, stroke with hemiplegia, myalgias, cervical intraepithelial glandular neoplasia, obesity, suprapubic catheter, anxiety and psychosis.   11/06 she had choking spell after eating breakfast. She was short of breath after incident. In the ED, WBC was 14.5, Na+ 131, BNP 473.9, troponin 37. CXR noted diffuse interstitial air space opacities favoring multifocal infection or aspiration, trace right pleural effusion also noted. Breathing improved with azithromycin and ceftriaxone  x 1 dose. 11/09 soap sud enema ordered due to constipation. She was discharged with cefdinir  and azithromycin.   Today, she reports feeling tired, but happy to be back at TL. She is not using oxygen at this time.  Suprapubic cath in place> adequate UOP. LBM 11/09 with soap sud enema. Appetite fair.    Past Medical History:  Diagnosis Date   Acute ST elevation myocardial infarction (STEMI) of inferolateral wall (HCC) 01/25/2020   Blind    Cholecystitis 04/22/2021   History of migraine headaches    Hyperlipemia    Myocardial infarction [facility])    Stroke [facility])    Past Surgical History:  Procedure Laterality Date   ABDOMINAL HYSTERECTOMY     BACK SURGERY     CARDIAC CATHETERIZATION     CORONARY/GRAFT ACUTE MI REVASCULARIZATION N/A 01/25/2020   Procedure: Coronary/Graft Acute MI Revascularization;  Surgeon: [name], MD;  Location: [facility] INVASIVE CV LAB;  Service: Cardiovascular;  Laterality: N/A;   FRACTURE SURGERY     IR CATHETER TUBE CHANGE  08/10/2021   LEFT HEART CATH AND CORONARY ANGIOGRAPHY N/A 01/25/2020   Procedure: LEFT HEART CATH AND CORONARY ANGIOGRAPHY;  Surgeon: [name], MD;  Location: [facility] INVASIVE CV LAB;  Service: Cardiovascular;  Laterality: N/A;   SKIN GRAFT Right    TOTAL ABDOMINAL HYSTERECTOMY Bilateral     Allergies  Allergen Reactions   Levaquin [Levofloxacin In D5w] Anaphylaxis and Swelling   Iodinated Contrast Media Itching    Severe itching   Other     Dust mites and opiates (all of them)   Latex Dermatitis    Outpatient Encounter Medications as of 06/09/2024  Medication Sig   amLODipine (NORVASC) 2.5 MG tablet Take 2.5 mg by mouth daily.   aspirin  EC 81 MG tablet Take 81 mg by mouth daily.   azithromycin (ZITHROMAX) 500 MG  tablet Take 1 tablet (500 mg total) by mouth daily for 3 days.   cefdinir  (OMNICEF ) 300 MG capsule Take 1 capsule (300 mg total) by mouth 2 (two) times daily for 3 days.   Cholecalciferol (VITAMIN D3) 1.25 MG (50000 UT) CAPS Take 1 capsule by mouth once a week.  every Mon for Supplement   Dextromethorphan-Benzocaine (COUGH/SORE THROAT LOZENGES MT) Use as directed 1 drop in the mouth or throat every 4 (four) hours as needed.    diazepam  (VALIUM ) 10 MG tablet TAKE 1 TABLET BY MOUTH FOUR TIMES A DAY   fluticasone  (FLONASE ) 50 MCG/ACT nasal spray Place 1 spray into both nostrils 2 (two) times daily.   ibuprofen  (ADVIL ) 800 MG tablet Take 800 mg by mouth 2 (two) times daily as needed for mild pain (pain score 1-3) or moderate pain (pain score 4-6).   isosorbide mononitrate (IMDUR) 60 MG 24 hr tablet Take 60 mg by mouth daily.   lansoprazole (PREVACID) 15 MG capsule Take 15 mg by mouth 2 (two) times daily before a meal.   levothyroxine  (SYNTHROID ) 25 MCG tablet Take 25 mcg by mouth daily before breakfast.   losartan  (COZAAR ) 100 MG tablet Take 100 mg by mouth daily.   magnesium  hydroxide (MILK OF MAGNESIA) 400 MG/5ML suspension Take 5 mLs by mouth every 6 (six) hours as needed for mild constipation.   nitroGLYCERIN  (NITROSTAT ) 0.4 MG SL tablet Place 0.4 mg under the tongue every 5 (five) minutes x 3 doses as needed for chest pain.    nystatin  powder Apply 1 Application topically 3 (three) times daily.   OLANZapine  (ZYPREXA ) 5 MG tablet Take 5 mg by mouth daily.   ondansetron  (ZOFRAN -ODT) 4 MG disintegrating tablet Take 4 mg by mouth every 4 (four) hours as needed for nausea or vomiting.   polyethylene glycol (MIRALAX  / GLYCOLAX ) 17 g packet Take 17 g by mouth daily.   promethazine  (PHENERGAN ) 12.5 MG suppository Place 1 suppository (12.5 mg total) rectally every 6 (six) hours as needed for nausea or vomiting.   ranolazine (RANEXA) 500 MG 12 hr tablet Take 500 mg by mouth 2 (two) times daily.   rosuvastatin (CRESTOR) 5 MG tablet Take 5 mg by mouth daily. Every Tuesday,Thursday,Sat   senna-docusate (SENOKOT-S) 8.6-50 MG tablet Take 2 tablets by mouth at bedtime.   Vibegron  (GEMTESA ) 75 MG TABS Take 75 mg by mouth daily.   No facility-administered encounter medications on file as of 06/09/2024.    Review of Systems  Constitutional: Negative.   HENT: Negative.    Eyes:  Positive for visual disturbance.  Respiratory:   Negative for cough and shortness of breath.   Cardiovascular:  Negative for chest pain and leg swelling.  Gastrointestinal:  Positive for constipation. Negative for abdominal distention, diarrhea, nausea and vomiting.  Genitourinary:  Negative for hematuria.  Musculoskeletal:  Positive for gait problem.  Skin:  Negative for wound.  Neurological:  Positive for weakness. Negative for dizziness and headaches.  Psychiatric/Behavioral:  Negative for dysphoric mood. The patient is not nervous/anxious.     Immunization History  Administered Date(s) Administered   Influenza-Unspecified 04/28/2011, 06/14/2012   Moderna Sars-Covid-2 Vaccination 09/08/2019, 10/06/2019, 06/11/2020   Tdap 09/03/2013, 11/15/2016   Zoster Recombinant(Shingrix) 12/08/2022, 03/10/2023   Pertinent  Health Maintenance Due  Topic Date Due   Colonoscopy  10/10/2024 (Originally 07/09/2006)   OPHTHALMOLOGY EXAM  10/22/2024 (Originally [birth year]10/1971)   Mammogram  11/29/2024   HEMOGLOBIN A1C  12/03/2024   FOOT EXAM  05/21/2025   Influenza Vaccine  Discontinued      03/01/2022    7:43 AM 05/28/2022    2:13 AM 12/07/2022    2:19 PM 12/27/2023   11:28 AM 05/25/2024    9:46 AM  Fall Risk  Falls in the past year?   Exclusion - non ambulatory 0 0  Was there an injury with Fall?     0  Fall Risk Category Calculator     0  (RETIRED) Patient Fall Risk Level Moderate fall risk  Moderate fall risk      Patient at Risk for Falls Due to     History of fall(s)  Fall risk Follow up     Falls evaluation completed     Data saved with a previous flowsheet row definition   Functional Status Survey:    Vitals:   06/09/24 1105  BP: 122/82  Pulse: 85  Resp: (!) 22  Temp: 98 F (36.7 C)  SpO2: 92%  Weight: 179 lb 12.8 oz (81.6 kg)  Height: 5' (1.524 m)   Body mass index is 35.11 kg/m. Physical Exam Vitals reviewed.  Constitutional:      General: She is not in acute distress.    Appearance: She is obese. She is not  ill-appearing.  HENT:     Head: Normocephalic.  Eyes:     General:        Right eye: No discharge.        Left eye: No discharge.  Cardiovascular:     Rate and Rhythm: Normal rate and regular rhythm.     Pulses: Normal pulses.     Heart sounds: Normal heart sounds.  Pulmonary:     Effort: Pulmonary effort is normal. No respiratory distress.     Breath sounds: Rhonchi present. No wheezing or rales.  Abdominal:     General: There is no distension.     Tenderness: There is no abdominal tenderness.  Genitourinary:    Comments: Suprapubic, UOP yellow clear Musculoskeletal:        General: No signs of injury.     Right lower leg: No edema.     Left lower leg: No edema.  Skin:    General: Skin is warm.     Capillary Refill: Capillary refill takes less than 2 seconds.  Neurological:     General: No focal deficit present.     Mental Status: She is alert. Mental status is at baseline.     Motor: Weakness present.     Gait: Gait abnormal.  Psychiatric:        Mood and Affect: Mood normal.     Labs reviewed: Recent Labs    06/05/24 1034 06/05/24 1433 06/06/24 1240 06/07/24 0536  NA 131*  --  135 133*  K 3.6  --  3.7 3.6  CL 96*  --  99 99  CO2 21*  --  25 24  GLUCOSE 201*  --  138* 118*  BUN 9  --  13 18  CREATININE 0.78  --  0.96 0.89  CALCIUM  8.7*  --  8.5* 8.5*  MG  --  1.8  --   --    Recent Labs    11/08/23 0000 11/11/23 1336 06/05/24 1034  AST 25 16 18   ALT 10 12 8   ALKPHOS 94 98 83  BILITOT  --  0.7 0.5  PROT  --  7.4 7.5  ALBUMIN 3.9 4.0 3.6   Recent Labs    02/07/24 0000 03/03/24 0000 06/05/24 1034 06/06/24 1240  06/07/24 0536 06/08/24 0834  WBC 9.1 10.2 14.5* 14.1* 12.2* 11.5*  NEUTROABS 7,353.00 8,007.00 12.5*  --   --   --   HGB 14.2 13.1 12.9 12.3 11.8* 12.3  HCT 44 40 40.3 35.9* 35.2* 37.1  MCV  --   --  84.5 81.8 82.8 83.7  PLT 276 348 354 352 335 301   Lab Results  Component Value Date   TSH 2.44 06/04/2023   Lab Results   Component Value Date   HGBA1C 5.9 (H) 06/05/2024   Lab Results  Component Value Date   CHOL 196 06/05/2024   HDL 69 06/05/2024   LDLCALC 113 (H) 06/05/2024   TRIG 68 06/05/2024   CHOLHDL 2.8 06/05/2024    Significant Diagnostic Results in last 30 days:  ECHOCARDIOGRAM COMPLETE Result Date: 06/06/2024    ECHOCARDIOGRAM REPORT   Patient Name:   DEEKSHA COTRELL Date of Exam: 06/06/2024 Medical Rec #:  [id number]    Height:       60.0 in Accession #:    [id number]   Weight:       181.5 lb Date of Birth:  [birth year]   BSA:          1.791 m Patient Age:    62 years     BP:           128/77 mmHg Patient Gender: F            HR:           96 bpm. Exam Location:  [facility] Procedure: 2D Echo, Cardiac Doppler and Color Doppler (Both Spectral and Color            Flow Doppler were utilized during procedure). Indications:     CHF-acute diastolic I50.31  History:         Patient has prior history of Echocardiogram examinations, most                  recent 01/26/2020. Previous Myocardial Infarction, Stroke; Risk                  Factors:Dyslipidemia.  Sonographer:     Christopher Furnace Referring Phys:  [id number] [facility] Diagnosing Phys: Cara JONETTA Lovelace MD IMPRESSIONS  1. Left ventricular ejection fraction, by estimation, is 20 to 25%. The left ventricle has severely decreased function. The left ventricle demonstrates global hypokinesis. The left ventricular internal cavity size was moderately to severely dilated. There is moderate asymmetric left ventricular hypertrophy of the inferior and basal segments. Left ventricular diastolic parameters are consistent with Grade III diastolic dysfunction (restrictive).  2. Right ventricular systolic function is mildly reduced. The right ventricular size is mildly enlarged.  3. The mitral valve is grossly normal. Mild mitral valve regurgitation.  4. The aortic valve is normal in structure. Aortic valve regurgitation is not visualized. FINDINGS  Left Ventricle: Left ventricular ejection  fraction, by estimation, is 20 to 25%. The left ventricle has severely decreased function. The left ventricle demonstrates global hypokinesis. Strain was performed and the global longitudinal strain is indeterminate. The left ventricular internal cavity size was moderately to severely dilated. There is moderate asymmetric left ventricular hypertrophy of the inferior and basal segments. Left ventricular diastolic parameters are consistent with Grade III  diastolic dysfunction (restrictive). Right Ventricle: The right ventricular size is mildly enlarged. No increase in right ventricular wall thickness. Right ventricular systolic function is mildly reduced. Left Atrium: Left atrial size was normal in size. Right Atrium: Right atrial size was  normal in size. Pericardium: There is no evidence of pericardial effusion. Mitral Valve: The mitral valve is grossly normal. Mild mitral valve regurgitation. Tricuspid Valve: The tricuspid valve is normal in structure. Tricuspid valve regurgitation is mild. Aortic Valve: The aortic valve is normal in structure. Aortic valve regurgitation is not visualized. Aortic valve mean gradient measures 2.0 mmHg. Aortic valve peak gradient measures 3.7 mmHg. Aortic valve area, by VTI measures 2.44 cm. Pulmonic Valve: The pulmonic valve was normal in structure. Pulmonic valve regurgitation is not visualized. Aorta: The ascending aorta was not well visualized. IAS/Shunts: No atrial level shunt detected by color flow Doppler. Additional Comments: 3D was performed not requiring image post processing on an independent workstation and was indeterminate.  LEFT VENTRICLE PLAX 2D LVIDd:         5.54 cm      Diastology LVIDs:         2.69 cm      LV e' medial:    3.04 cm/s LV PW:         1.49 cm      LV E/e' medial:  29.8 LV IVS:        0.84 cm      LV e' lateral:   6.67 cm/s LVOT diam:     2.00 cm      LV E/e' lateral: 13.6 LV SV:         47 LV SV Index:   26 LVOT Area:     3.14 cm LV IVRT:       131  msec  LV Volumes (MOD) LV vol d, MOD A2C: 111.0 ml LV vol d, MOD A4C: 132.0 ml LV vol s, MOD A2C: 102.0 ml LV vol s, MOD A4C: 110.0 ml LV SV MOD A2C:     9.0 ml LV SV MOD A4C:     132.0 ml LV SV MOD BP:      15.3 ml RIGHT VENTRICLE RV Basal diam:  2.82 cm RV Mid diam:    2.55 cm RV S prime:     11.90 cm/s TAPSE (M-mode): 1.3 cm LEFT ATRIUM             Index        RIGHT ATRIUM           Index LA diam:        3.50 cm 1.95 cm/m   RA Area:     12.20 cm LA Vol (A2C):   26.5 ml 14.79 ml/m  RA Volume:   26.50 ml  14.79 ml/m LA Vol (A4C):   69.8 ml 38.94 ml/m LA Biplane Vol: 24.5 ml 13.68 ml/m  AORTIC VALVE AV Area (Vmax):    2.59 cm AV Area (Vmean):   2.38 cm AV Area (VTI):     2.44 cm AV Vmax:           96.75 cm/s AV Vmean:          67.650 cm/s AV VTI:            0.192 m AV Peak Grad:      3.7 mmHg AV Mean Grad:      2.0 mmHg LVOT Vmax:         79.80 cm/s LVOT Vmean:        51.200 cm/s LVOT VTI:          0.149 m LVOT/AV VTI ratio: 0.78  AORTA Ao Root diam: 2.90 cm MITRAL VALVE  TRICUSPID VALVE MV Area (PHT): 6.07 cm    TR Peak grad:   8.9 mmHg MV Decel Time: 125 msec    TR Vmax:        149.00 cm/s MV E velocity: 90.70 cm/s MV A velocity: 66.50 cm/s  SHUNTS MV E/A ratio:  1.36        Systemic VTI:  0.15 m                            Systemic Diam: 2.00 cm Cara JONETTA Lovelace MD Electronically signed by Cara JONETTA Lovelace MD Signature Date/Time: 06/06/2024/5:02:03 PM    Final    DG Chest Portable 1 View Result Date: 06/05/2024 EXAM: 1 VIEW(S) XRAY OF THE CHEST 06/05/2024 10:57:00 AM COMPARISON: 10/04/2023 CLINICAL HISTORY: aspirated food, feeling SOB. wheezing on exam FINDINGS: LUNGS AND PLEURA: New diffuse interstitial and patchy lung opacities. Relative sparing of the left upper lobe. Trace right pleural effusion. No pneumothorax. HEART AND MEDIASTINUM: Cardiomegaly. Aortic atherosclerotic calcification. BONES AND SOFT TISSUES: No acute osseous abnormality. IMPRESSION: 1. New diffuse interstitial  airspace opacities relatively diffusely. Favor multifocal infection or aspiration over pulmonary edema. 2. Trace right pleural effusion. 3. Aortic atherosclerosis (icd10-i70.0). Electronically signed by: Rockey Kilts MD 06/05/2024 11:54 AM EST RP Workstation: [id number]    Assessment/Plan 1. Acute hypoxic respiratory failure (HCC) (Primary) - hospitalized 11/06-11/09> CXR suggestive of multifocal infection/aspiration, right effusion also noted - breathing improved with azithromycin and ceftriaxone   - some rhonchi on exam, no rales  - not on oxygen - cont cefdinir  and azithromycin   2. Essential hypertension - controlled with amlodipine and losartan   3. HFrEF (heart failure with reduced ejection fraction) (HCC) - recent BNP 473 - given IV furosemide 60 mg once - not on diuretics   4. History of stroke - cont asa and rosuvastatin - cont skilled nursing  5. Suprapubic catheter (HCC) - cont monthly changes  6. Constipation by delayed colonic transit - 11/09 soap sud enema - cont miralax  and senna  7. Class 2 severe obesity due to excess calories with serious comorbidity and body mass index (BMI) of 35.0 to 35.9 in adult - sedentary lifestyle due to past CVA    Family/ staff Communication: plan discussed with patient and nurse  Labs/tests ordered:  none

## 2024-06-12 ENCOUNTER — Non-Acute Institutional Stay (SKILLED_NURSING_FACILITY): Payer: Self-pay | Admitting: Internal Medicine

## 2024-06-12 ENCOUNTER — Emergency Department

## 2024-06-12 ENCOUNTER — Encounter: Payer: Self-pay | Admitting: Emergency Medicine

## 2024-06-12 ENCOUNTER — Inpatient Hospital Stay
Admission: EM | Admit: 2024-06-12 | Discharge: 2024-06-16 | DRG: 291 | Disposition: A | Discharge status: Home / Self Care | Attending: Internal Medicine | Admitting: Internal Medicine

## 2024-06-12 ENCOUNTER — Ambulatory Visit: Admitting: Physician Assistant

## 2024-06-12 ENCOUNTER — Encounter: Payer: Self-pay | Admitting: Internal Medicine

## 2024-06-12 ENCOUNTER — Other Ambulatory Visit: Payer: Self-pay

## 2024-06-12 DIAGNOSIS — J189 Pneumonia, unspecified organism: Secondary | ICD-10-CM | POA: Diagnosis present

## 2024-06-12 DIAGNOSIS — Z882 Allergy status to sulfonamides status: Secondary | ICD-10-CM

## 2024-06-12 DIAGNOSIS — J45901 Unspecified asthma with (acute) exacerbation: Secondary | ICD-10-CM | POA: Diagnosis present

## 2024-06-12 DIAGNOSIS — I429 Cardiomyopathy, unspecified: Secondary | ICD-10-CM | POA: Diagnosis present

## 2024-06-12 DIAGNOSIS — I110 Hypertensive heart disease with heart failure: Principal | ICD-10-CM | POA: Diagnosis present

## 2024-06-12 DIAGNOSIS — Z66 Do not resuscitate: Secondary | ICD-10-CM | POA: Diagnosis present

## 2024-06-12 DIAGNOSIS — I2510 Atherosclerotic heart disease of native coronary artery without angina pectoris: Secondary | ICD-10-CM | POA: Diagnosis present

## 2024-06-12 DIAGNOSIS — I5A Non-ischemic myocardial injury (non-traumatic): Secondary | ICD-10-CM | POA: Diagnosis present

## 2024-06-12 DIAGNOSIS — Z7982 Long term (current) use of aspirin: Secondary | ICD-10-CM

## 2024-06-12 DIAGNOSIS — J441 Chronic obstructive pulmonary disease with (acute) exacerbation: Secondary | ICD-10-CM | POA: Diagnosis present

## 2024-06-12 DIAGNOSIS — Z83438 Family history of other disorder of lipoprotein metabolism and other lipidemia: Secondary | ICD-10-CM

## 2024-06-12 DIAGNOSIS — Z87892 Personal history of anaphylaxis: Secondary | ICD-10-CM

## 2024-06-12 DIAGNOSIS — R7989 Other specified abnormal findings of blood chemistry: Secondary | ICD-10-CM

## 2024-06-12 DIAGNOSIS — K219 Gastro-esophageal reflux disease without esophagitis: Secondary | ICD-10-CM | POA: Diagnosis present

## 2024-06-12 DIAGNOSIS — Z8249 Family history of ischemic heart disease and other diseases of the circulatory system: Secondary | ICD-10-CM

## 2024-06-12 DIAGNOSIS — E1169 Type 2 diabetes mellitus with other specified complication: Secondary | ICD-10-CM

## 2024-06-12 DIAGNOSIS — Z833 Family history of diabetes mellitus: Secondary | ICD-10-CM

## 2024-06-12 DIAGNOSIS — J440 Chronic obstructive pulmonary disease with acute lower respiratory infection: Secondary | ICD-10-CM | POA: Diagnosis present

## 2024-06-12 DIAGNOSIS — I252 Old myocardial infarction: Secondary | ICD-10-CM

## 2024-06-12 DIAGNOSIS — E785 Hyperlipidemia, unspecified: Secondary | ICD-10-CM | POA: Diagnosis present

## 2024-06-12 DIAGNOSIS — I5023 Acute on chronic systolic (congestive) heart failure: Secondary | ICD-10-CM | POA: Diagnosis present

## 2024-06-12 DIAGNOSIS — I69354 Hemiplegia and hemiparesis following cerebral infarction affecting left non-dominant side: Secondary | ICD-10-CM | POA: Diagnosis not present

## 2024-06-12 DIAGNOSIS — I69351 Hemiplegia and hemiparesis following cerebral infarction affecting right dominant side: Secondary | ICD-10-CM

## 2024-06-12 DIAGNOSIS — Z1152 Encounter for screening for COVID-19: Secondary | ICD-10-CM | POA: Diagnosis not present

## 2024-06-12 DIAGNOSIS — Z823 Family history of stroke: Secondary | ICD-10-CM

## 2024-06-12 DIAGNOSIS — Z9359 Other cystostomy status: Secondary | ICD-10-CM

## 2024-06-12 DIAGNOSIS — E871 Hypo-osmolality and hyponatremia: Secondary | ICD-10-CM | POA: Diagnosis present

## 2024-06-12 DIAGNOSIS — Z79890 Hormone replacement therapy (postmenopausal): Secondary | ICD-10-CM

## 2024-06-12 DIAGNOSIS — Z6835 Body mass index (BMI) 35.0-35.9, adult: Secondary | ICD-10-CM

## 2024-06-12 DIAGNOSIS — J9601 Acute respiratory failure with hypoxia: Secondary | ICD-10-CM | POA: Diagnosis present

## 2024-06-12 DIAGNOSIS — R062 Wheezing: Secondary | ICD-10-CM

## 2024-06-12 DIAGNOSIS — H548 Legal blindness, as defined in USA: Secondary | ICD-10-CM | POA: Diagnosis present

## 2024-06-12 DIAGNOSIS — Z8701 Personal history of pneumonia (recurrent): Secondary | ICD-10-CM

## 2024-06-12 DIAGNOSIS — E039 Hypothyroidism, unspecified: Secondary | ICD-10-CM | POA: Diagnosis present

## 2024-06-12 DIAGNOSIS — I2489 Other forms of acute ischemic heart disease: Secondary | ICD-10-CM | POA: Diagnosis present

## 2024-06-12 DIAGNOSIS — Z91040 Latex allergy status: Secondary | ICD-10-CM

## 2024-06-12 DIAGNOSIS — I69398 Other sequelae of cerebral infarction: Secondary | ICD-10-CM

## 2024-06-12 DIAGNOSIS — Z91041 Radiographic dye allergy status: Secondary | ICD-10-CM

## 2024-06-12 DIAGNOSIS — K5900 Constipation, unspecified: Secondary | ICD-10-CM | POA: Diagnosis present

## 2024-06-12 DIAGNOSIS — I509 Heart failure, unspecified: Secondary | ICD-10-CM

## 2024-06-12 DIAGNOSIS — Z888 Allergy status to other drugs, medicaments and biological substances status: Secondary | ICD-10-CM

## 2024-06-12 DIAGNOSIS — Z955 Presence of coronary angioplasty implant and graft: Secondary | ICD-10-CM

## 2024-06-12 DIAGNOSIS — E119 Type 2 diabetes mellitus without complications: Secondary | ICD-10-CM | POA: Diagnosis present

## 2024-06-12 DIAGNOSIS — Z79899 Other long term (current) drug therapy: Secondary | ICD-10-CM

## 2024-06-12 DIAGNOSIS — Z90710 Acquired absence of both cervix and uterus: Secondary | ICD-10-CM

## 2024-06-12 LAB — BASIC METABOLIC PANEL WITH GFR
Anion gap: 11 (ref 5–15)
Anion gap: 13 (ref 5–15)
BUN: 6 mg/dL — ABNORMAL LOW (ref 8–23)
BUN: 6 mg/dL — ABNORMAL LOW (ref 8–23)
CO2: 27 mmol/L (ref 22–32)
CO2: 27 mmol/L (ref 22–32)
Calcium: 7.3 mg/dL — ABNORMAL LOW (ref 8.9–10.3)
Calcium: 9.2 mg/dL (ref 8.9–10.3)
Chloride: 91 mmol/L — ABNORMAL LOW (ref 98–111)
Chloride: 92 mmol/L — ABNORMAL LOW (ref 98–111)
Creatinine, Ser: 0.84 mg/dL (ref 0.44–1.00)
Creatinine, Ser: 0.77 mg/dL (ref 0.44–1.00)
GFR, Estimated: >60 mL/min (ref >60)
GFR, Estimated: >60 mL/min (ref >60)
Glucose, Bld: 348 mg/dL — ABNORMAL HIGH (ref 70–99)
Glucose, Bld: 171 mg/dL — ABNORMAL HIGH (ref 70–99)
Potassium: 3.5 mmol/L (ref 3.5–5.1)
Potassium: 3.8 mmol/L (ref 3.5–5.1)
Sodium: 129 mmol/L — ABNORMAL LOW (ref 135–145)
Sodium: 131 mmol/L — ABNORMAL LOW (ref 135–145)

## 2024-06-12 LAB — CBC WITH DIFFERENTIAL/PLATELET
Abs Immature Granulocytes: 0.09 K/uL — ABNORMAL HIGH (ref 0.00–0.07)
Basophils Absolute: 0 K/uL (ref 0.0–0.1)
Basophils Relative: 0 %
Eosinophils Absolute: 0.1 K/uL (ref 0.0–0.5)
Eosinophils Relative: 1 %
HCT: 34.7 % — ABNORMAL LOW (ref 36.0–46.0)
Hemoglobin: 11.6 g/dL — ABNORMAL LOW (ref 12.0–15.0)
Immature Granulocytes: 1 %
Lymphocytes Relative: 9 %
Lymphs Abs: 1.4 K/uL (ref 0.7–4.0)
MCH: 27.8 pg (ref 26.0–34.0)
MCHC: 33.4 g/dL (ref 30.0–36.0)
MCV: 83.2 fL (ref 80.0–100.0)
Monocytes Absolute: 0.9 K/uL (ref 0.1–1.0)
Monocytes Relative: 6 %
Neutro Abs: 13.1 K/uL — ABNORMAL HIGH (ref 1.7–7.7)
Neutrophils Relative %: 83 %
Platelets: 323 K/uL (ref 150–400)
RBC: 4.17 MIL/uL (ref 3.87–5.11)
RDW: 13.5 % (ref 11.5–15.5)
WBC: 15.6 K/uL — ABNORMAL HIGH (ref 4.0–10.5)
nRBC: 0 % (ref 0.0–0.2)

## 2024-06-12 LAB — TROPONIN T, HIGH SENSITIVITY
Troponin T High Sensitivity: 61 ng/L — ABNORMAL HIGH (ref 0–19)
Troponin T High Sensitivity: 93 ng/L — ABNORMAL HIGH (ref 0–19)
Troponin T High Sensitivity: 113 ng/L (ref 0–19)

## 2024-06-12 LAB — COMPREHENSIVE METABOLIC PANEL WITH GFR
ALT: 5 U/L (ref 0–44)
AST: 13 U/L — ABNORMAL LOW (ref 15–41)
Albumin: 3.4 g/dL — ABNORMAL LOW (ref 3.5–5.0)
Alkaline Phosphatase: 75 U/L (ref 38–126)
Anion gap: 11 (ref 5–15)
BUN: 7 mg/dL — ABNORMAL LOW (ref 8–23)
CO2: 20 mmol/L — ABNORMAL LOW (ref 22–32)
Calcium: 8.3 mg/dL — ABNORMAL LOW (ref 8.9–10.3)
Chloride: 90 mmol/L — ABNORMAL LOW (ref 98–111)
Creatinine, Ser: 0.74 mg/dL (ref 0.44–1.00)
GFR, Estimated: >60 mL/min (ref >60)
Glucose, Bld: 162 mg/dL — ABNORMAL HIGH (ref 70–99)
Potassium: 3.5 mmol/L (ref 3.5–5.1)
Sodium: 121 mmol/L — ABNORMAL LOW (ref 135–145)
Total Bilirubin: 0.3 mg/dL (ref 0.0–1.2)
Total Protein: 5.6 g/dL — ABNORMAL LOW (ref 6.5–8.1)

## 2024-06-12 LAB — CBG MONITORING, ED
Glucose-Capillary: 214 mg/dL — ABNORMAL HIGH (ref 70–99)
Glucose-Capillary: 152 mg/dL — ABNORMAL HIGH (ref 70–99)
Glucose-Capillary: 194 mg/dL — ABNORMAL HIGH (ref 70–99)

## 2024-06-12 LAB — RESP PANEL BY RT-PCR (RSV, FLU A&B, COVID)  RVPGX2
Influenza A by PCR: NEGATIVE
Influenza B by PCR: NEGATIVE
Resp Syncytial Virus by PCR: NEGATIVE
SARS Coronavirus 2 by RT PCR: NEGATIVE

## 2024-06-12 LAB — OSMOLALITY, URINE: Osmolality, Ur: 128 mosm/kg — ABNORMAL LOW (ref 300–900)

## 2024-06-12 LAB — HEMOGLOBIN A1C
Hgb A1c MFr Bld: 6.1 % — ABNORMAL HIGH (ref 4.8–5.6)
Mean Plasma Glucose: 128.37 mg/dL

## 2024-06-12 LAB — BLOOD GAS, VENOUS

## 2024-06-12 LAB — OSMOLALITY: Osmolality: 272 mosm/kg — ABNORMAL LOW (ref 275–295)

## 2024-06-12 LAB — PRO BRAIN NATRIURETIC PEPTIDE: Pro Brain Natriuretic Peptide: 1956 pg/mL — ABNORMAL HIGH (ref <300.0)

## 2024-06-12 LAB — SODIUM, URINE, RANDOM: Sodium, Ur: 38 mmol/L

## 2024-06-12 MED ORDER — FUROSEMIDE 10 MG/ML IJ SOLN
40.0000 mg | Freq: Two times a day (BID) | INTRAMUSCULAR | Status: DC
  Administered 2024-06-13 – 2024-06-16 (×6): 40 mg via INTRAVENOUS
  Filled 2024-06-12 (×6): qty 4

## 2024-06-12 MED ORDER — ACETAMINOPHEN 650 MG RE SUPP
650.0000 mg | Freq: Four times a day (QID) | RECTAL | Status: DC | PRN
Start: 2024-06-12 — End: 2024-06-16

## 2024-06-12 MED ORDER — NITROGLYCERIN 0.4 MG SL SUBL
0.4000 mg | SUBLINGUAL_TABLET | SUBLINGUAL | Status: DC | PRN
Start: 2024-06-12 — End: 2024-06-16
  Administered 2024-06-12: 0.4 mg via SUBLINGUAL
  Filled 2024-06-12: qty 1

## 2024-06-12 MED ORDER — POLYETHYLENE GLYCOL 3350 17 G PO PACK
17.0000 g | PACK | Freq: Every day | ORAL | Status: DC
  Administered 2024-06-12 – 2024-06-16 (×5): 17 g via ORAL
  Filled 2024-06-12 (×5): qty 1

## 2024-06-12 MED ORDER — METHYLPREDNISOLONE SODIUM SUCC 40 MG IJ SOLR: 40.0000 mg | Freq: Two times a day (BID) | INTRAMUSCULAR | Status: DC

## 2024-06-12 MED ORDER — TRAZODONE HCL 50 MG PO TABS
25.0000 mg | ORAL_TABLET | Freq: Every evening | ORAL | Status: DC | PRN
  Filled 2024-06-12: qty 1

## 2024-06-12 MED ORDER — FUROSEMIDE 10 MG/ML IJ SOLN: 60.0000 mg | Freq: Every day | INTRAMUSCULAR | Status: DC

## 2024-06-12 MED ORDER — INSULIN ASPART 100 UNIT/ML IJ SOLN
0.0000 [IU] | Freq: Every day | INTRAMUSCULAR | Status: DC
  Administered 2024-06-13 – 2024-06-14 (×2): 3 [IU] via SUBCUTANEOUS
  Administered 2024-06-15: 2 [IU] via SUBCUTANEOUS
  Filled 2024-06-12: qty 3
  Filled 2024-06-12: qty 2
  Filled 2024-06-12: qty 3

## 2024-06-12 MED ORDER — ASPIRIN 81 MG PO CHEW
324.0000 mg | CHEWABLE_TABLET | Freq: Once | ORAL | Status: AC
  Administered 2024-06-12: 324 mg via ORAL
  Filled 2024-06-12: qty 4

## 2024-06-12 MED ORDER — RANOLAZINE ER 500 MG PO TB12
500.0000 mg | ORAL_TABLET | Freq: Two times a day (BID) | ORAL | Status: DC
  Administered 2024-06-12 – 2024-06-16 (×9): 500 mg via ORAL
  Filled 2024-06-12 (×10): qty 1

## 2024-06-12 MED ORDER — PANTOPRAZOLE SODIUM 20 MG PO TBEC
20.0000 mg | DELAYED_RELEASE_TABLET | Freq: Every day | ORAL | Status: DC
  Administered 2024-06-12 – 2024-06-16 (×5): 20 mg via ORAL
  Filled 2024-06-12 (×5): qty 1

## 2024-06-12 MED ORDER — ALBUTEROL SULFATE (2.5 MG/3ML) 0.083% IN NEBU: 2.5000 mg | INHALATION_SOLUTION | RESPIRATORY_TRACT | Status: DC | PRN

## 2024-06-12 MED ORDER — FLUTICASONE PROPIONATE 50 MCG/ACT NA SUSP
1.0000 | Freq: Two times a day (BID) | NASAL | Status: DC
  Administered 2024-06-12 – 2024-06-16 (×9): 1 via NASAL
  Filled 2024-06-12 (×2): qty 16

## 2024-06-12 MED ORDER — IPRATROPIUM-ALBUTEROL 0.5-2.5 (3) MG/3ML IN SOLN
9.0000 mL | Freq: Once | RESPIRATORY_TRACT | Status: AC
  Administered 2024-06-12: 9 mL via RESPIRATORY_TRACT
  Filled 2024-06-12: qty 9

## 2024-06-12 MED ORDER — METHYLPREDNISOLONE SODIUM SUCC 125 MG IJ SOLR
80.0000 mg | INTRAMUSCULAR | Status: DC
  Administered 2024-06-12 – 2024-06-14 (×3): 80 mg via INTRAVENOUS
  Filled 2024-06-12 (×3): qty 2

## 2024-06-12 MED ORDER — IPRATROPIUM-ALBUTEROL 0.5-2.5 (3) MG/3ML IN SOLN
3.0000 mL | Freq: Four times a day (QID) | RESPIRATORY_TRACT | Status: DC
  Administered 2024-06-12 – 2024-06-14 (×7): 3 mL via RESPIRATORY_TRACT
  Filled 2024-06-12 (×8): qty 3

## 2024-06-12 MED ORDER — OLANZAPINE 5 MG PO TABS
5.0000 mg | ORAL_TABLET | Freq: Every day | ORAL | Status: DC
  Administered 2024-06-12 – 2024-06-16 (×5): 5 mg via ORAL
  Filled 2024-06-12 (×5): qty 1

## 2024-06-12 MED ORDER — ASPIRIN 81 MG PO TBEC
81.0000 mg | DELAYED_RELEASE_TABLET | Freq: Every day | ORAL | Status: DC
  Administered 2024-06-13 – 2024-06-16 (×4): 81 mg via ORAL
  Filled 2024-06-12 (×4): qty 1

## 2024-06-12 MED ORDER — ENOXAPARIN SODIUM 40 MG/0.4ML IJ SOSY
40.0000 mg | PREFILLED_SYRINGE | INTRAMUSCULAR | Status: DC
  Administered 2024-06-12 – 2024-06-16 (×5): 40 mg via SUBCUTANEOUS
  Filled 2024-06-12 (×5): qty 0.4

## 2024-06-12 MED ORDER — LOSARTAN POTASSIUM 50 MG PO TABS
100.0000 mg | ORAL_TABLET | Freq: Every day | ORAL | Status: DC
  Administered 2024-06-13 – 2024-06-16 (×4): 100 mg via ORAL
  Filled 2024-06-12 (×4): qty 2

## 2024-06-12 MED ORDER — ROSUVASTATIN CALCIUM 5 MG PO TABS
5.0000 mg | ORAL_TABLET | ORAL | Status: DC
  Administered 2024-06-12 – 2024-06-14 (×2): 5 mg via ORAL
  Filled 2024-06-12 (×4): qty 1

## 2024-06-12 MED ORDER — LEVOTHYROXINE SODIUM 25 MCG PO TABS
25.0000 ug | ORAL_TABLET | Freq: Every day | ORAL | Status: DC
  Administered 2024-06-13 – 2024-06-16 (×4): 25 ug via ORAL
  Filled 2024-06-12 (×4): qty 1

## 2024-06-12 MED ORDER — ACETAMINOPHEN 325 MG PO TABS
650.0000 mg | ORAL_TABLET | Freq: Four times a day (QID) | ORAL | Status: DC | PRN
  Administered 2024-06-13: 650 mg via ORAL
  Filled 2024-06-12: qty 2

## 2024-06-12 MED ORDER — MIRABEGRON ER 25 MG PO TB24
25.0000 mg | ORAL_TABLET | Freq: Every day | ORAL | Status: DC
  Administered 2024-06-12 – 2024-06-16 (×5): 25 mg via ORAL
  Filled 2024-06-12 (×5): qty 1

## 2024-06-12 MED ORDER — INSULIN ASPART 100 UNIT/ML IJ SOLN
0.0000 [IU] | Freq: Three times a day (TID) | INTRAMUSCULAR | Status: DC
  Administered 2024-06-12: 5 [IU] via SUBCUTANEOUS
  Administered 2024-06-12: 3 [IU] via SUBCUTANEOUS
  Administered 2024-06-13: 2 [IU] via SUBCUTANEOUS
  Administered 2024-06-13: 3 [IU] via SUBCUTANEOUS
  Administered 2024-06-14 – 2024-06-15 (×2): 5 [IU] via SUBCUTANEOUS
  Administered 2024-06-15: 3 [IU] via SUBCUTANEOUS
  Administered 2024-06-15: 2 [IU] via SUBCUTANEOUS
  Administered 2024-06-16: 3 [IU] via SUBCUTANEOUS
  Filled 2024-06-12: qty 2
  Filled 2024-06-12: qty 5
  Filled 2024-06-12 (×2): qty 3
  Filled 2024-06-12 (×2): qty 5
  Filled 2024-06-12 (×2): qty 3
  Filled 2024-06-12: qty 2

## 2024-06-12 MED ORDER — FUROSEMIDE 10 MG/ML IJ SOLN
80.0000 mg | Freq: Once | INTRAMUSCULAR | Status: AC
  Administered 2024-06-12: 80 mg via INTRAVENOUS
  Filled 2024-06-12: qty 8

## 2024-06-12 MED ORDER — AMLODIPINE BESYLATE 5 MG PO TABS: 2.5000 mg | ORAL_TABLET | Freq: Every day | ORAL | Status: DC

## 2024-06-12 MED ORDER — ISOSORBIDE MONONITRATE ER 60 MG PO TB24
60.0000 mg | ORAL_TABLET | Freq: Every day | ORAL | Status: DC
  Administered 2024-06-12 – 2024-06-16 (×5): 60 mg via ORAL
  Filled 2024-06-12 (×5): qty 1

## 2024-06-12 MED ORDER — ONDANSETRON HCL 4 MG PO TABS: 4.0000 mg | ORAL_TABLET | Freq: Four times a day (QID) | ORAL | Status: DC | PRN

## 2024-06-12 MED ORDER — ONDANSETRON HCL 4 MG/2ML IJ SOLN: 4.0000 mg | Freq: Four times a day (QID) | INTRAMUSCULAR | Status: DC | PRN

## 2024-06-12 MED ORDER — METHYLPREDNISOLONE SODIUM SUCC 125 MG IJ SOLR
125.0000 mg | Freq: Once | INTRAMUSCULAR | Status: AC
  Administered 2024-06-12: 125 mg via INTRAVENOUS
  Filled 2024-06-12: qty 2

## 2024-06-12 MED ORDER — SENNOSIDES-DOCUSATE SODIUM 8.6-50 MG PO TABS
2.0000 | ORAL_TABLET | Freq: Every day | ORAL | Status: DC
  Administered 2024-06-12 – 2024-06-15 (×4): 2 via ORAL
  Filled 2024-06-12 (×4): qty 2

## 2024-06-12 NOTE — Consult Note (Signed)
 Victoria Medina       Patient ID: Victoria Medina MRN: [id number] DOB/AGE: 08-28-1960 63 y.o.  Admit date: 06/12/2024 Referring Physician Dr. Katha Gail Primary Physician [name], DO  Primary Cardiologist Dr. Wilburn Reason for Consultation AoCHFrEF  HPI: Victoria Medina is a 63 y.o. female  with a past medical history of chronic HFrEF, coronary artery disease s/p multiple stents, hypertension, history of stroke with flaccid hemiaplasia, hypothyroidism, morbid obesity, blindness, chronic suprapubic catheter who presented to the ED on 06/12/2024 for respiratory distress.  Suspect heart failure exacerbation.  Cardiology was consulted for further evaluation.   Portions of the history obtained via chart review.  She was brought in from assisted living facility due to respiratory distress.  Recently discharged from the hospital for pneumonia.  Workup in the ED notable for creatinine 0.74, potassium 3.5, hemoglobin 11.6, WBC 15.6. Troponins 61 > 93, BNP 1956. EKG in the ED Normal sinus rhythm rate is 75 bpm, no acute ischemic changes.  CXR with interstitial edema.  Started on IV Lasix, IV steroids, nebulizers in the ED.   At the time of my evaluation today, patient is resting in hospital bed on BiPAP.  States that yesterday she started feeling some shortness of breath and chest tightness.  States that this gets worse when she is talking.  Was given a dose of IV Lasix in the ED with good UOP.  Review of systems complete and found to be negative unless listed above    Past Medical History:  Diagnosis Date   Acute ST elevation myocardial infarction (STEMI) of inferolateral wall (HCC) 01/25/2020   Blind    Cholecystitis 04/22/2021   History of migraine headaches    Hyperlipemia    Myocardial infarction [facility])    Stroke [facility]     Past Surgical History:  Procedure Laterality Date   ABDOMINAL HYSTERECTOMY     BACK SURGERY     CARDIAC CATHETERIZATION     CORONARY/GRAFT  ACUTE MI REVASCULARIZATION N/A 01/25/2020   Procedure: Coronary/Graft Acute MI Revascularization;  Surgeon: [name], MD;  Location: [facility] INVASIVE CV LAB;  Service: Cardiovascular;  Laterality: N/A;   FRACTURE SURGERY     IR CATHETER TUBE CHANGE  08/10/2021   LEFT HEART CATH AND CORONARY ANGIOGRAPHY N/A 01/25/2020   Procedure: LEFT HEART CATH AND CORONARY ANGIOGRAPHY;  Surgeon: [name], MD;  Location: [facility] INVASIVE CV LAB;  Service: Cardiovascular;  Laterality: N/A;   SKIN GRAFT Right    TOTAL ABDOMINAL HYSTERECTOMY Bilateral     (Not in a hospital admission)  Social History   Socioeconomic History   Marital status: Single    Spouse name: Not on file   Number of children: Not on file   Years of education: Not on file   Highest education level: Not on file  Occupational History   Not on file  Tobacco Use   Smoking status: Never   Smokeless tobacco: Never  Vaping Use   Vaping status: Never Used  Substance and Sexual Activity   Alcohol use: Yes    Comment: occasional   Drug use: No   Sexual activity: Not Currently  Other Topics Concern   Not on file  Social History Narrative   Not on file   Social Drivers of Health   Financial Resource Strain: Not on file  Food Insecurity: No Food Insecurity (06/05/2024)   Hunger Vital Sign    Worried About Running Out of Food in the Last Year: Never true  Ran Out of Food in the Last Year: Never true  Transportation Needs: No Transportation Needs (06/05/2024)   PRAPARE - Administrator, Civil Service (Medical): No    Lack of Transportation (Non-Medical): No  Physical Activity: Not on file  Stress: Not on file  Social Connections: Not on file  Intimate Partner Violence: Not At Risk (06/05/2024)   Humiliation, Afraid, Rape, and Kick questionnaire    Fear of Current or Ex-Partner: No    Emotionally Abused: No    Physically Abused: No    Sexually Abused: No    Family History  Problem Relation Age of  Onset   Heart attack Mother    Hypertension Mother    Hypercholesterolemia Mother    Diabetes Mother    Stroke Father    Heart attack Father    Hypertension Father    Hypercholesterolemia Father    Diabetes Father    Diabetes Maternal Grandmother    Cancer - Other Maternal Grandmother      Vitals:   06/12/24 0530 06/12/24 0600 06/12/24 0630 06/12/24 0816  BP: 120/78 107/70 107/73   Pulse: 71 70 70   Resp: 12 14 16    Temp:      SpO2: 100% 97% 99% 99%  Weight:        PHYSICAL EXAM General: Chronically ill appearing female, well nourished, in no acute distress. HEENT: Normocephalic and atraumatic. Neck: No JVD.  Lungs: Normal respiratory effort on BiPAP. Crackles bilaterally. Heart: HRRR. Normal S1 and S2 without gallops or murmurs.  Abdomen: Non-distended appearing.  Msk: Normal strength and tone for age. Extremities: Warm and well perfused. No clubbing, cyanosis. Trace edema.  Neuro: Alert and oriented X 3. Psych: Answers questions appropriately.   Labs: Basic Metabolic Panel: Recent Labs    06/12/24 0300 06/12/24 0812  NA 121* 129*  K 3.5 3.5  CL 90* 91*  CO2 20* 27  GLUCOSE 162* 348*  BUN 7* 6*  CREATININE 0.74 0.84  CALCIUM  8.3* 7.3*   Liver Function Tests: Recent Labs    06/12/24 0300  AST 13*  ALT 5  ALKPHOS 75  BILITOT 0.3  PROT 5.6*  ALBUMIN 3.4*   No results for input(s): LIPASE, AMYLASE in the last 72 hours. CBC: Recent Labs    06/12/24 0150  WBC 15.6*  NEUTROABS 13.1*  HGB 11.6*  HCT 34.7*  MCV 83.2  PLT 323   Cardiac Enzymes: No results for input(s): CKTOTAL, CKMB, CKMBINDEX, TROPONINIHS in the last 72 hours. BNP: No results for input(s): BNP in the last 72 hours. D-Dimer: No results for input(s): DDIMER in the last 72 hours. Hemoglobin A1C: No results for input(s): HGBA1C in the last 72 hours. Fasting Lipid Panel: No results for input(s): CHOL, HDL, LDLCALC, TRIG, CHOLHDL, LDLDIRECT in the last  72 hours. Thyroid Function Tests: No results for input(s): TSH, T4TOTAL, T3FREE, THYROIDAB in the last 72 hours.  Invalid input(s): FREET3 Anemia Panel: No results for input(s): VITAMINB12, FOLATE, FERRITIN, TIBC, IRON, RETICCTPCT in the last 72 hours.   Radiology: [facility] Chest Port 1 View Result Date: 06/12/2024 CLINICAL DATA:  Cough EXAM: PORTABLE CHEST 1 VIEW COMPARISON:  06/05/2024 FINDINGS: Cardiac shadow is stable. Aortic calcifications are noted. Diffuse interstitial changes are seen consistent with edema. No effusion is seen. No focal infiltrate is noted. Postsurgical changes in the cervical spine are seen. IMPRESSION: Interstitial edema. Electronically Signed   By: Oneil Devonshire M.D.   On: 06/12/2024 02:06   ECHOCARDIOGRAM COMPLETE Result Date:  06/06/2024    ECHOCARDIOGRAM REPORT   Patient Name:   AYAME RENA Date of Exam: 06/06/2024 Medical Rec #:  [id number]    Height:       60.0 in Accession #:    [id number]   Weight:       181.5 lb Date of Birth:  June 21, 1961   BSA:          1.791 m Patient Age:    62 years     BP:           128/77 mmHg Patient Gender: F            HR:           96 bpm. Exam Location:  [facility] Procedure: 2D Echo, Cardiac Doppler and Color Doppler (Both Spectral and Color            Flow Doppler were utilized during procedure). Indications:     CHF-acute diastolic I50.31  History:         Patient has prior history of Echocardiogram examinations, most                  recent 01/26/2020. Previous Myocardial Infarction, Stroke; Risk                  Factors:Dyslipidemia.  Sonographer:     Christopher Furnace Referring Phys:  [id number] [facility] Diagnosing Phys: Cara JONETTA Lovelace MD IMPRESSIONS  1. Left ventricular ejection fraction, by estimation, is 20 to 25%. The left ventricle has severely decreased function. The left ventricle demonstrates global hypokinesis. The left ventricular internal cavity size was moderately to severely dilated. There is moderate asymmetric left  ventricular hypertrophy of the inferior and basal segments. Left ventricular diastolic parameters are consistent with Grade III diastolic dysfunction (restrictive).  2. Right ventricular systolic function is mildly reduced. The right ventricular size is mildly enlarged.  3. The mitral valve is grossly normal. Mild mitral valve regurgitation.  4. The aortic valve is normal in structure. Aortic valve regurgitation is not visualized. FINDINGS  Left Ventricle: Left ventricular ejection fraction, by estimation, is 20 to 25%. The left ventricle has severely decreased function. The left ventricle demonstrates global hypokinesis. Strain was performed and the global longitudinal strain is indeterminate. The left ventricular internal cavity size was moderately to severely dilated. There is moderate asymmetric left ventricular hypertrophy of the inferior and basal segments. Left ventricular diastolic parameters are consistent with Grade III  diastolic dysfunction (restrictive). Right Ventricle: The right ventricular size is mildly enlarged. No increase in right ventricular wall thickness. Right ventricular systolic function is mildly reduced. Left Atrium: Left atrial size was normal in size. Right Atrium: Right atrial size was normal in size. Pericardium: There is no evidence of pericardial effusion. Mitral Valve: The mitral valve is grossly normal. Mild mitral valve regurgitation. Tricuspid Valve: The tricuspid valve is normal in structure. Tricuspid valve regurgitation is mild. Aortic Valve: The aortic valve is normal in structure. Aortic valve regurgitation is not visualized. Aortic valve mean gradient measures 2.0 mmHg. Aortic valve peak gradient measures 3.7 mmHg. Aortic valve area, by VTI measures 2.44 cm. Pulmonic Valve: The pulmonic valve was normal in structure. Pulmonic valve regurgitation is not visualized. Aorta: The ascending aorta was not well visualized. IAS/Shunts: No atrial level shunt detected by color flow  Doppler. Additional Comments: 3D was performed not requiring image post processing on an independent workstation and was indeterminate.  LEFT VENTRICLE PLAX 2D LVIDd:  5.54 cm      Diastology LVIDs:         2.69 cm      LV e' medial:    3.04 cm/s LV PW:         1.49 cm      LV E/e' medial:  29.8 LV IVS:        0.84 cm      LV e' lateral:   6.67 cm/s LVOT diam:     2.00 cm      LV E/e' lateral: 13.6 LV SV:         47 LV SV Index:   26 LVOT Area:     3.14 cm LV IVRT:       131 msec  LV Volumes (MOD) LV vol d, MOD A2C: 111.0 ml LV vol d, MOD A4C: 132.0 ml LV vol s, MOD A2C: 102.0 ml LV vol s, MOD A4C: 110.0 ml LV SV MOD A2C:     9.0 ml LV SV MOD A4C:     132.0 ml LV SV MOD BP:      15.3 ml RIGHT VENTRICLE RV Basal diam:  2.82 cm RV Mid diam:    2.55 cm RV S prime:     11.90 cm/s TAPSE (M-mode): 1.3 cm LEFT ATRIUM             Index        RIGHT ATRIUM           Index LA diam:        3.50 cm 1.95 cm/m   RA Area:     12.20 cm LA Vol (A2C):   26.5 ml 14.79 ml/m  RA Volume:   26.50 ml  14.79 ml/m LA Vol (A4C):   69.8 ml 38.94 ml/m LA Biplane Vol: 24.5 ml 13.68 ml/m  AORTIC VALVE AV Area (Vmax):    2.59 cm AV Area (Vmean):   2.38 cm AV Area (VTI):     2.44 cm AV Vmax:           96.75 cm/s AV Vmean:          67.650 cm/s AV VTI:            0.192 m AV Peak Grad:      3.7 mmHg AV Mean Grad:      2.0 mmHg LVOT Vmax:         79.80 cm/s LVOT Vmean:        51.200 cm/s LVOT VTI:          0.149 m LVOT/AV VTI ratio: 0.78  AORTA Ao Root diam: 2.90 cm MITRAL VALVE               TRICUSPID VALVE MV Area (PHT): 6.07 cm    TR Peak grad:   8.9 mmHg MV Decel Time: 125 msec    TR Vmax:        149.00 cm/s MV E velocity: 90.70 cm/s MV A velocity: 66.50 cm/s  SHUNTS MV E/A ratio:  1.36        Systemic VTI:  0.15 m                            Systemic Diam: 2.00 cm Cara JONETTA Lovelace MD Electronically signed by Cara JONETTA Lovelace MD Signature Date/Time: 06/06/2024/5:02:03 PM    Final    DG Chest Portable 1 View Result Date:  06/05/2024 EXAM: 1 VIEW(S) XRAY OF THE CHEST  06/05/2024 10:57:00 AM COMPARISON: 10/04/2023 CLINICAL HISTORY: aspirated food, feeling SOB. wheezing on exam FINDINGS: LUNGS AND PLEURA: New diffuse interstitial and patchy lung opacities. Relative sparing of the left upper lobe. Trace right pleural effusion. No pneumothorax. HEART AND MEDIASTINUM: Cardiomegaly. Aortic atherosclerotic calcification. BONES AND SOFT TISSUES: No acute osseous abnormality. IMPRESSION: 1. New diffuse interstitial airspace opacities relatively diffusely. Favor multifocal infection or aspiration over pulmonary edema. 2. Trace right pleural effusion. 3. Aortic atherosclerosis ([id number]). Electronically signed by: Rockey Kilts MD 06/05/2024 11:54 AM EST RP Workstation: [id number]    ECHO 06/06/2024: 1. Left ventricular ejection fraction, by estimation, is 20 to 25%. The left ventricle has severely decreased function. The left ventricle demonstrates global hypokinesis. The left ventricular internal cavity size was moderately to severely dilated. There is moderate asymmetric left ventricular hypertrophy of the inferior and basal segments. Left ventricular diastolic parameters are consistent with Grade III diastolic dysfunction (restrictive).   2. Right ventricular systolic function is mildly reduced. The right  ventricular size is mildly enlarged.   3. The mitral valve is grossly normal. Mild mitral valve regurgitation.   4. The aortic valve is normal in structure. Aortic valve regurgitation is  not visualized.   TELEMETRY (personally reviewed): Off telemetry  EKG (personally reviewed): Normal sinus rhythm rate is 75 bpm, no acute ischemic changes  Data reviewed by me 06/12/2024: last 24h vitals tele labs imaging I/O ED provider Medina, admission H&P  Principal Problem:   Acute respiratory failure with hypoxia (HCC)    ASSESSMENT AND PLAN:  Ermelinda Eckert is a 63 y.o. female  with a past medical history of chronic HFrEF, coronary  artery disease s/p multiple stents, hypertension, history of stroke with flaccid hemiaplasia, hypothyroidism, morbid obesity, blindness, chronic suprapubic catheter who presented to the ED on 06/12/2024 for respiratory distress.  Suspect heart failure exacerbation.  Cardiology was consulted for further evaluation.   # Acute on chronic HFrEF # Demand ischemia # Coronary artery disease # Hx stroke, hemiplasia Patient recently admitted for PNA now presenting with SOB and chest tightness worse with talking. BNP elevated at 1956. Troponins 61 > 93. EKG without acute ischemic changes. Started on IV diuresis. Echo last admission with EF 20-25%, global hypokinesis.  -Will continue IV diuresis with lasix 40 mg BID.  -Continue home losartan  100 mg daily. Will plan for further additions to GDMT during admission. Previously has not tolerated beta blockers. -Suspect troponin elevation most consistent with demand/supply mismatch and not ACS in the setting of acute heart failure. Will check one more troponin.   This patient's plan of care was discussed and created with Dr. Ammon and he is in agreement.  Signed: Danita Bloch, PA-C  06/12/2024, 9:50 AM [facility]

## 2024-06-12 NOTE — H&P (Signed)
 History and Physical  Victoria Medina FMW:[id number] DOB: [birth year] DOA: 06/12/2024  PCP: Victoria Locus, DO   Chief Complaint: Shortness of breath  HPI: Victoria Medina is a 63 y.o. female with medical history significant for bilateral blindness, CAD, stroke, anxiety/depression, hypothyroidism, morbid obesity, heart failure with reduced EF, chronic suprapubic catheter recently discharged after being hospitalized secondary to acute hypoxic respiratory failure due to aspiration pneumonia and mild CHF exacerbation who is now being admitted to the hospital with recurrent acute hypoxic respiratory failure due to acute on chronic heart failure with reduced EF.  Patient was discharged on 11/9 back to [facility] on p.o. antibiotics.  Tells me that after return to her facility, overall she was doing well but she feels like she gradually developed some shortness of breath.  She denies any significant cough, sputum production, fevers, chills.  She says that she had some central chest pain overnight, but this has resolved.  She was brought to the emergency department via EMS earlier this morning in respiratory distress.  Per facility staff, patient had increasing shortness of breath over the last 1 to 2 days, with shortness of breath and wheezing, she was noted to have an initial room air saturation of 78%.  She was given 1 DuoNeb and placed on a nonrebreather mask by EMS, saturating 97% on arrival.  In the emergency department, she was placed on BiPAP, given breathing treatments as well as 80 mg IV Lasix.  Review of Systems: Please see HPI for pertinent positives and negatives. A complete 10 system review of systems are otherwise negative.  Past Medical History:  Diagnosis Date   Acute ST elevation myocardial infarction (STEMI) of inferolateral wall (HCC) 01/25/2020   Blind    Cholecystitis 04/22/2021   History of migraine headaches    Hyperlipemia    Myocardial infarction [facility])    Stroke [facility])    Past Surgical  History:  Procedure Laterality Date   ABDOMINAL HYSTERECTOMY     BACK SURGERY     CARDIAC CATHETERIZATION     CORONARY/GRAFT ACUTE MI REVASCULARIZATION N/A 01/25/2020   Procedure: Coronary/Graft Acute MI Revascularization;  Surgeon: [name], MD;  Location: [facility] INVASIVE CV LAB;  Service: Cardiovascular;  Laterality: N/A;   FRACTURE SURGERY     IR CATHETER TUBE CHANGE  08/10/2021   LEFT HEART CATH AND CORONARY ANGIOGRAPHY N/A 01/25/2020   Procedure: LEFT HEART CATH AND CORONARY ANGIOGRAPHY;  Surgeon: [name], MD;  Location: [facility] INVASIVE CV LAB;  Service: Cardiovascular;  Laterality: N/A;   SKIN GRAFT Right    TOTAL ABDOMINAL HYSTERECTOMY Bilateral    Social History:  reports that she has never smoked. She has never used smokeless tobacco. She reports current alcohol use. She reports that she does not use drugs.  Allergies  Allergen Reactions   Levaquin [Levofloxacin In D5w] Anaphylaxis and Swelling   Iodinated Contrast Media Itching    Severe itching   Other     Dust mites and opiates (all of them)   Latex Dermatitis    Family History  Problem Relation Age of Onset   Heart attack Mother    Hypertension Mother    Hypercholesterolemia Mother    Diabetes Mother    Stroke Father    Heart attack Father    Hypertension Father    Hypercholesterolemia Father    Diabetes Father    Diabetes Maternal Grandmother    Cancer - Other Maternal Grandmother      Prior to Admission medications  Medication Sig Start Date End Date Taking? Authorizing Provider  amLODipine (NORVASC) 2.5 MG tablet Take 2.5 mg by mouth daily.   Yes [provider]  aspirin  EC 81 MG tablet Take 81 mg by mouth daily.   Yes [provider]  Cholecalciferol (VITAMIN D3) 1.25 MG (50000 UT) CAPS Take 1 capsule by mouth once a week.  every Mon for Supplement   Yes [provider]  Dextromethorphan-Benzocaine (COUGH/SORE THROAT LOZENGES MT) Use as directed 1 drop in the  mouth or throat every 4 (four) hours as needed.   Yes [provider]  diazepam  (VALIUM ) 10 MG tablet TAKE 1 TABLET BY MOUTH FOUR TIMES A DAY 05/23/24  Yes Fargo, Amy E, NP  fluticasone  (FLONASE ) 50 MCG/ACT nasal spray Place 1 spray into both nostrils 2 (two) times daily.   Yes [provider]  ibuprofen  (ADVIL ) 800 MG tablet Take 800 mg by mouth 2 (two) times daily as needed for mild pain (pain score 1-3) or moderate pain (pain score 4-6).   Yes [provider]  isosorbide mononitrate (IMDUR) 60 MG 24 hr tablet Take 60 mg by mouth daily.   Yes [provider]  lansoprazole (PREVACID) 15 MG capsule Take 15 mg by mouth 2 (two) times daily before a meal.   Yes [provider]  levothyroxine  (SYNTHROID ) 25 MCG tablet Take 25 mcg by mouth daily before breakfast.   Yes [provider]  losartan  (COZAAR ) 100 MG tablet Take 100 mg by mouth daily.   Yes [provider]  magnesium  hydroxide (MILK OF MAGNESIA) 400 MG/5ML suspension Take 5 mLs by mouth every 6 (six) hours as needed for mild constipation.   Yes [provider]  nitroGLYCERIN  (NITROSTAT ) 0.4 MG SL tablet Place 0.4 mg under the tongue every 5 (five) minutes x 3 doses as needed for chest pain.    Yes [provider]  OLANZapine  (ZYPREXA ) 5 MG tablet Take 5 mg by mouth daily.   Yes [provider]  ondansetron  (ZOFRAN -ODT) 4 MG disintegrating tablet Take 4 mg by mouth every 4 (four) hours as needed for nausea or vomiting.   Yes [provider]  polyethylene glycol (MIRALAX  / GLYCOLAX ) 17 g packet Take 17 g by mouth daily.   Yes [provider]  ranolazine (RANEXA) 500 MG 12 hr tablet Take 500 mg by mouth 2 (two) times daily.   Yes [provider]  rosuvastatin (CRESTOR) 5 MG tablet Take 5 mg by mouth daily. Every Tuesday,Thursday,Sat   Yes [provider]  senna-docusate (SENOKOT-S) 8.6-50 MG tablet Take 2 tablets by mouth at  bedtime.   Yes [provider]  Vibegron  (GEMTESA ) 75 MG TABS Take 75 mg by mouth daily. 03/08/22  Yes Vaillancourt, Samantha, PA-C  nystatin  powder Apply 1 Application topically 3 (three) times daily. Patient not taking: Reported on 06/12/2024    [provider]  promethazine  (PHENERGAN ) 12.5 MG suppository Place 1 suppository (12.5 mg total) rectally every 6 (six) hours as needed for nausea or vomiting. Patient not taking: Reported on 06/12/2024 11/11/23   Floy Roberts, MD    Physical Exam: BP 107/73   Pulse 70   Temp 98 F (36.7 C)   Resp 16   Wt 82 kg   SpO2 99%   BMI 35.31 kg/m  General:  Alert, orientedx3, calm, in no acute distress, wearing BiPAP mask Cardiovascular: RRR, no murmurs or rubs, no peripheral edema  Respiratory: Distant bilateral breath sounds, minimal tachypnea, no wheezing,  minimal diffuse rhonchi, mild bibasilar crackles Abdomen: soft, nontender, nondistended, normal bowel tones heard  Skin: dry, no rashes  GU: Foley catheter with a large amount of clear yellow urine Musculoskeletal: no joint effusions, normal range of motion  Psychiatric: appropriate affect, normal speech  Neurologic: extraocular muscles intact, clear speech, moving all extremities with intact sensorium         Labs on Admission:  Basic Metabolic Panel: Recent Labs  Lab 06/05/24 1034 06/05/24 1433 06/06/24 1240 06/07/24 0536 06/12/24 0300  NA 131*  --  135 133* 121*  K 3.6  --  3.7 3.6 3.5  CL 96*  --  99 99 90*  CO2 21*  --  25 24 20*  GLUCOSE 201*  --  138* 118* 162*  BUN 9  --  13 18 7*  CREATININE 0.78  --  0.96 0.89 0.74  CALCIUM  8.7*  --  8.5* 8.5* 8.3*  MG  --  1.8  --   --   --    Liver Function Tests: Recent Labs  Lab 06/05/24 1034 06/12/24 0300  AST 18 13*  ALT 8 5  ALKPHOS 83 75  BILITOT 0.5 0.3  PROT 7.5 5.6*  ALBUMIN 3.6 3.4*   No results for input(s): LIPASE, AMYLASE in the last 168 hours. No results for input(s): AMMONIA in  the last 168 hours. CBC: Recent Labs  Lab 06/05/24 1034 06/06/24 1240 06/07/24 0536 06/08/24 0834 06/12/24 0150  WBC 14.5* 14.1* 12.2* 11.5* 15.6*  NEUTROABS 12.5*  --   --   --  13.1*  HGB 12.9 12.3 11.8* 12.3 11.6*  HCT 40.3 35.9* 35.2* 37.1 34.7*  MCV 84.5 81.8 82.8 83.7 83.2  PLT 354 352 335 301 323   Cardiac Enzymes: No results for input(s): CKTOTAL, CKMB, CKMBINDEX, TROPONINI in the last 168 hours. BNP (last 3 results) Recent Labs    06/05/24 1034  BNP 473.9*    ProBNP (last 3 results) Recent Labs    06/12/24 0300  PROBNP 1,956.0*    CBG: Recent Labs  Lab 06/07/24 1145 06/07/24 2135 06/08/24 0617 06/08/24 0823 06/08/24 1055  GLUCAP 142* 129* 125* 141* 141*    Radiological Exams on Admission: DG Chest Port 1 View Result Date: 06/12/2024 CLINICAL DATA:  Cough EXAM: PORTABLE CHEST 1 VIEW COMPARISON:  06/05/2024 FINDINGS: Cardiac shadow is stable. Aortic calcifications are noted. Diffuse interstitial changes are seen consistent with edema. No effusion is seen. No focal infiltrate is noted. Postsurgical changes in the cervical spine are seen. IMPRESSION: Interstitial edema. Electronically Signed   By: Oneil Devonshire M.D.   On: 06/12/2024 02:06   Assessment/Plan Acsa Estey is a 63 y.o. female with medical history significant for bilateral blindness, CAD, stroke, anxiety/depression, hypothyroidism, morbid obesity, heart failure with reduced EF, chronic suprapubic catheter recently discharged after being hospitalized secondary to acute hypoxic respiratory failure due to aspiration pneumonia and mild CHF exacerbation who is now being admitted to the hospital with recurrent acute hypoxic respiratory failure due to acute on chronic heart failure with reduced EF.  Acute hypoxic respiratory failure-most likely appears to be due to acute on chronic heart failure with reduced EF, given her elevated BNP, interstitial edema on chest x-ray, and significant improvement  already with IV Lasix. -Inpatient admission -Monitor closely on progressive unit, with telemetry -Patient currently on BiPAP but significantly improved, may wean oxygen as tolerated with goal O2 saturation greater than 89%  Acute on chronic congestive heart failure with reduced EF.  Echo 06/06/2024  demonstrates EF 20 to 25% and grade 3 diastolic dysfunction.  She received IV Lasix during her prior hospitalization, but does not appear to have been discharged with diuretics.  She has received IV Lasix 80 mg x 1 in the emergency department this morning and is having a robust response. -Low sodium fluid restricted diet -Continue Imdur, losartan  -Followed by [facility] cardiology, they have been consulted and will see her today -Will defer further diuretic dosing pending clinical response  Hyponatremia-patient is alert and oriented, does not appear to be symptomatic.  Patient without prior history of severe hyponatremia.  This may be SIADH but more likely hypervolemic hypovolemia related to heart failure.  She does not appear to be on any medications which commonly cause hyponatremia.  Patient requires diuresis as above, which may worsen hyponatremia. -Continue diuresis as above -Fluid restriction 1500 cc / 24 hours -Check serum osmolality, urine osmolality, urine sodium -Recheck BMP now -Consider inpatient nephrology consultation if not improving  Troponemia-patient endorses some mild chest pain earlier when she was more short of breath, but this has resolved.  EKG personally reviewed, no acute ST changes according to my interpretation.  Most likely this is troponin leak given her significant hypoxia and heart failure. -Monitor on telemetry -Continue to trend troponin -Discussed with [facility] cardiology, they will consult today  Chronic bilateral blindness-noted  Constipation-continue daily bowel regimen  Hypothyroidism-levothyroxine  25 mcg daily  Essential hypertension-continue home  amlodipine, isosorbide, losartan   DVT prophylaxis: Lovenox      Code Status: Limited: Do not attempt resuscitation (DNR) -DNR-LIMITED -Do Not Intubate/DNI   Consults called: Cardiology  Admission status: The appropriate patient status for this patient is INPATIENT. Inpatient status is judged to be reasonable and necessary in order to provide the required intensity of service to ensure the patient's safety. The patient's presenting symptoms, physical exam findings, and initial radiographic and laboratory data in the context of their chronic comorbidities is felt to place them at high risk for further clinical deterioration. Furthermore, it is not anticipated that the patient will be medically stable for discharge from the hospital within 2 midnights of admission.    I certify that at the point of admission it is my clinical judgment that the patient will require inpatient hospital care spanning beyond 2 midnights from the point of admission due to high intensity of service, high risk for further deterioration and high frequency of surveillance required  Time spent: 59 minutes  Milind Raether CHRISTELLA Gail MD [facility] Pager [phone]  If 7PM-7AM, please contact night-coverage [email or web address] Password [email or web address]  06/12/2024, 7:43 AM

## 2024-06-12 NOTE — Progress Notes (Signed)
 PHARMACIST - PHYSICIAN COMMUNICATION   CONCERNING: Methylprednisolone  IV    Current order: Methylprednisolone  IV 40mg  every 12 hours     DESCRIPTION: Per [facility] Protocol:   IV methylprednisolone  will be converted to either a q12h or q24h frequency with the same total daily dose (TDD).  Ordered Dose: 1 to 125 mg TDD; convert to: TDD q24h.  Ordered Dose: 126 to 250 mg TDD; convert to: TDD div q12h.  Ordered Dose: >250 mg TDD; DAW.  Order has been adjusted to: Methylprednisolone  IV 80mg  every 24 hours.  Patient received methylprednisolone  125mg  IV @ 0203 this morning (06/12/24)    Estill CHRISTELLA Lutes, PharmD, BCPS Clinical Pharmacist 06/12/2024 7:35 AM

## 2024-06-12 NOTE — ED Notes (Signed)
 RECOLLECT OF GREEN TUBE @ THIS TIME DUE TO LAB REPORTING BOTH PRIOR GREEN TUBES SENT LOOKED HEMOLYZED BY LOOKING AT THE TUBE

## 2024-06-12 NOTE — Assessment & Plan Note (Addendum)
 Patient sent to the ER early this morning due to worsening hypoxia with reported room air saturations below 88%.  Appears that she is being readmitted back to [facility] for further care.

## 2024-06-12 NOTE — ED Notes (Signed)
 Attempted to draw labs through IV access. Unable to as IV flushes but does not draw back. This RN attempted to start another IV to do blood work and was unsuccessful. Lab called to obtain blood work

## 2024-06-12 NOTE — ED Notes (Signed)
 Fall bundle verified

## 2024-06-12 NOTE — ED Notes (Signed)
 LAB REPORTED AT THIS TIME THAT THE RECOLLECT GREEN TUBE WAS AGAIN HEMOLYZED - LAB TO SEND TECH TO DRAW BLOOD FOR PATIENT

## 2024-06-12 NOTE — Progress Notes (Signed)
 [facility] Admission H&P  Provider: [name], DO Location:  Other [facility]) Nursing Home Room Number: [facility] 405-A Place of Service:  SNF (31)   PCP: [name], DO Patient Care Team: [name], DO as PCP - General (Internal Medicine) [name], MD as PCP - Cardiology (Cardiology)   Extended Emergency Contact Information Primary Emergency Contact: [facility]  [place] Home Phone: [phone] Mobile Phone: [phone] Relation: Sister Secondary Emergency Contact: [name] Address: [id number]          [place], [place] [address] [place] Home Phone: [phone] Relation: Mother   Goals of Care: Advanced Directive information    06/12/2024    9:54 AM  Advanced Directives  Does Patient Have a Medical Advance Directive? Yes  Type of Advance Directive Out of facility DNR (pink MOST or yellow form);Healthcare Power of Attorney  Does patient want to make changes to medical advance directive? No - Patient declined  Copy of Healthcare Power of Attorney in Chart? Yes - validated most recent copy scanned in chart (See row information)  Pre-existing out of facility DNR order (yellow form or pink MOST form) Yellow form placed in chart (order not valid for inpatient use)    CODE STATUS: Do Not Resuscitate (DNR)    Chief Complaint  Patient presents with   New Admit To SNF     HPI: Patient is a 63 y.o. female seen today for admission to [facility] for long-term care.  Patient was admitted on June 05, 2024 after choking on breakfast that morning and becoming hypoxic.  Patient is a long-term care resident at [facility].  She has a history of hypertension, hyperlipidemia, chronic suprapubic catheter due to chronic urinary retention, history of stroke with right-sided hemiplegia, blindness, hypothyroidism, coronary disease.  She was treated for aspiration with 4 days of antibiotics and transitioned  over to p.o. Omnicef  and Zithromax.  She had some constipation while in the hospital and was given 1 enema with good results.  She had acute hypoxic respiratory failure requiring oxygen due to her aspiration.  Her hypertension was controlled with her home blood pressure medications.  She was accepted back to [facility] for long-term care.  Before I could see the patient this morning, she had ready been sent out to the ER around 1:45 in the morning due to hypoxia.  Patient had not been examined on the date of this admission note.  This is a comprehensive admission note to this SNF performed on this date less than 30 days from date of admission. Included are preadmission medical/surgical history; reconciled medication list; family history; social history and comprehensive review of systems.  Corrections and additions to the records were documented. Comprehensive physical exam was also performed. Additionally a clinical summary was entered for each active diagnosis pertinent to this admission in the Problem List to enhance continuity of care.   Past Medical History:  Diagnosis Date   Acute ST elevation myocardial infarction (STEMI) of inferolateral wall (HCC) 01/25/2020   Blind    Cholecystitis 04/22/2021   History of migraine headaches    Hyperlipemia    Myocardial infarction [facility])    Stroke [facility])    Past Surgical History:  Procedure Laterality Date   ABDOMINAL HYSTERECTOMY     BACK SURGERY     CARDIAC CATHETERIZATION     CORONARY/GRAFT ACUTE MI REVASCULARIZATION N/A 01/25/2020   Procedure: Coronary/Graft Acute MI Revascularization;  Surgeon: Darron Deatrice  A, MD;  Location: [facility] INVASIVE CV LAB;  Service: Cardiovascular;  Laterality: N/A;   FRACTURE SURGERY     IR CATHETER TUBE CHANGE  08/10/2021   LEFT HEART CATH AND CORONARY ANGIOGRAPHY N/A 01/25/2020   Procedure: LEFT HEART CATH AND CORONARY ANGIOGRAPHY;  Surgeon: [name], MD;  Location: [facility] INVASIVE CV LAB;  Service:  Cardiovascular;  Laterality: N/A;   SKIN GRAFT Right    TOTAL ABDOMINAL HYSTERECTOMY Bilateral     reports that she has never smoked. She has never used smokeless tobacco. She reports current alcohol use. She reports that she does not use drugs. Social History   Socioeconomic History   Marital status: Single    Spouse name: Not on file   Number of children: Not on file   Years of education: Not on file   Highest education level: Not on file  Occupational History   Not on file  Tobacco Use   Smoking status: Never   Smokeless tobacco: Never  Vaping Use   Vaping status: Never Used  Substance and Sexual Activity   Alcohol use: Yes    Comment: occasional   Drug use: No   Sexual activity: Not Currently  Other Topics Concern   Not on file  Social History Narrative   Not on file   Social Drivers of Health   Financial Resource Strain: Not on file  Food Insecurity: No Food Insecurity (06/05/2024)   Hunger Vital Sign    Worried About Running Out of Food in the Last Year: Never true    Ran Out of Food in the Last Year: Never true  Transportation Needs: No Transportation Needs (06/05/2024)   PRAPARE - Administrator, Civil Service (Medical): No    Lack of Transportation (Non-Medical): No  Physical Activity: Not on file  Stress: Not on file  Social Connections: Not on file  Intimate Partner Violence: Not At Risk (06/05/2024)   Humiliation, Afraid, Rape, and Kick questionnaire    Fear of Current or Ex-Partner: No    Emotionally Abused: No    Physically Abused: No    Sexually Abused: No     Functional Status Survey:     Family History  Problem Relation Age of Onset   Heart attack Mother    Hypertension Mother    Hypercholesterolemia Mother    Diabetes Mother    Stroke Father    Heart attack Father    Hypertension Father    Hypercholesterolemia Father    Diabetes Father    Diabetes Maternal Grandmother    Cancer - Other Maternal Grandmother      Health  Maintenance  Topic Date Due   Pneumococcal Vaccine: 50+ Years (1 of 2 - PCV) Never done   Diabetic kidney evaluation - Urine ACR  04/16/2024   Colonoscopy  10/10/2024 (Originally 07/09/2006)   OPHTHALMOLOGY EXAM  10/22/2024 (Originally 07/10/1971)   Mammogram  11/29/2024   HEMOGLOBIN A1C  12/03/2024   Medicare Annual Wellness (AWV)  12/26/2024   FOOT EXAM  05/21/2025   Diabetic kidney evaluation - eGFR measurement  06/12/2025   DTaP/Tdap/Td (3 - Td or Tdap) 11/16/2026   Hepatitis C Screening  Completed   HIV Screening  Completed   Zoster Vaccines- Shingrix  Completed   Hepatitis B Vaccines 19-59 Average Risk  Aged Out   HPV VACCINES  Aged Out   Meningococcal B Vaccine  Aged Out   Influenza Vaccine  Discontinued   COVID-19 Vaccine  Discontinued  Allergies  Allergen Reactions   Levaquin [Levofloxacin In D5w] Anaphylaxis and Swelling   Iodinated Contrast Media Itching    Severe itching   Other     Dust mites and opiates (all of them)   Latex Dermatitis     Facility-Administered Encounter Medications as of 06/12/2024  Medication   acetaminophen  (TYLENOL ) tablet 650 mg   Or   acetaminophen  (TYLENOL ) suppository 650 mg   albuterol (PROVENTIL) (2.5 MG/3ML) 0.083% nebulizer solution 2.5 mg   [START ON 06/13/2024] amLODipine (NORVASC) tablet 2.5 mg   [START ON 06/13/2024] aspirin  EC tablet 81 mg   enoxaparin  (LOVENOX ) injection 40 mg   fluticasone  (FLONASE ) 50 MCG/ACT nasal spray 1 spray   ipratropium-albuterol (DUONEB) 0.5-2.5 (3) MG/3ML nebulizer solution 3 mL   isosorbide mononitrate (IMDUR) 24 hr tablet 60 mg   [START ON 06/13/2024] levothyroxine  (SYNTHROID ) tablet 25 mcg   [START ON 06/13/2024] losartan  (COZAAR ) tablet 100 mg   methylPREDNISolone  sodium succinate  (SOLU-MEDROL ) 125 mg/2 mL injection 80 mg   mirabegron  ER (MYRBETRIQ ) tablet 25 mg   OLANZapine  (ZYPREXA ) tablet 5 mg   ondansetron  (ZOFRAN ) tablet 4 mg   Or   ondansetron  (ZOFRAN ) injection 4 mg    pantoprazole  (PROTONIX ) EC tablet 20 mg   polyethylene glycol (MIRALAX  / GLYCOLAX ) packet 17 g   ranolazine (RANEXA) 12 hr tablet 500 mg   rosuvastatin (CRESTOR) tablet 5 mg   senna-docusate (Senokot-S) tablet 2 tablet   traZODone (DESYREL) tablet 25 mg   Outpatient Encounter Medications as of 06/12/2024  Medication Sig   amLODipine (NORVASC) 2.5 MG tablet Take 2.5 mg by mouth daily.   aspirin  EC 81 MG tablet Take 81 mg by mouth daily.   Cholecalciferol (VITAMIN D3) 1.25 MG (50000 UT) CAPS Take 1 capsule by mouth once a week.  every Mon for Supplement   Dextromethorphan-Benzocaine (COUGH/SORE THROAT LOZENGES MT) Use as directed 1 drop in the mouth or throat every 4 (four) hours as needed.   diazepam  (VALIUM ) 10 MG tablet TAKE 1 TABLET BY MOUTH FOUR TIMES A DAY   fluticasone  (FLONASE ) 50 MCG/ACT nasal spray Place 1 spray into both nostrils 2 (two) times daily.   ibuprofen  (ADVIL ) 800 MG tablet Take 800 mg by mouth 2 (two) times daily as needed for mild pain (pain score 1-3) or moderate pain (pain score 4-6).   isosorbide mononitrate (IMDUR) 60 MG 24 hr tablet Take 60 mg by mouth daily.   lansoprazole (PREVACID) 15 MG capsule Take 15 mg by mouth 2 (two) times daily before a meal.   levothyroxine  (SYNTHROID ) 25 MCG tablet Take 25 mcg by mouth daily before breakfast.   losartan  (COZAAR ) 100 MG tablet Take 100 mg by mouth daily.   magnesium  hydroxide (MILK OF MAGNESIA) 400 MG/5ML suspension Take 5 mLs by mouth every 6 (six) hours as needed for mild constipation.   nitroGLYCERIN  (NITROSTAT ) 0.4 MG SL tablet Place 0.4 mg under the tongue every 5 (five) minutes x 3 doses as needed for chest pain.    OLANZapine  (ZYPREXA ) 5 MG tablet Take 5 mg by mouth daily.   ondansetron  (ZOFRAN -ODT) 4 MG disintegrating tablet Take 4 mg by mouth every 4 (four) hours as needed for nausea or vomiting.   polyethylene glycol (MIRALAX  / GLYCOLAX ) 17 g packet Take 17 g by mouth daily.   ranolazine (RANEXA) 500 MG 12 hr  tablet Take 500 mg by mouth 2 (two) times daily.   rosuvastatin (CRESTOR) 5 MG tablet Take 5 mg by mouth daily. Every Tuesday,Thursday,Sat  senna-docusate (SENOKOT-S) 8.6-50 MG tablet Take 2 tablets by mouth at bedtime.   Vibegron  (GEMTESA ) 75 MG TABS Take 75 mg by mouth daily.   nystatin  powder Apply 1 Application topically 3 (three) times daily. (Patient not taking: Reported on 06/12/2024)   promethazine  (PHENERGAN ) 12.5 MG suppository Place 1 suppository (12.5 mg total) rectally every 6 (six) hours as needed for nausea or vomiting. (Patient not taking: Reported on 06/12/2024)     Review of Systems  Unable to perform ROS: Other  Patient not present   Vitals:   06/12/24 0948  BP: 114/69  Pulse: 78  Resp: 20  Temp: 98.2 F (36.8 C)  SpO2: 95%  Weight: 179 lb 12.8 oz (81.6 kg)  Height: 5' (1.524 m)   Body mass index is 35.11 kg/m. Physical Exam Patient not present  Labs reviewed: Basic Metabolic Panel: Recent Labs    06/05/24 1433 06/06/24 1240 06/07/24 0536 06/12/24 0300 06/12/24 0812  NA  --    < > 133* 121* 129*  K  --    < > 3.6 3.5 3.5  CL  --    < > 99 90* 91*  CO2  --    < > 24 20* 27  GLUCOSE  --    < > 118* 162* 348*  BUN  --    < > 18 7* 6*  CREATININE  --    < > 0.89 0.74 0.84  CALCIUM   --    < > 8.5* 8.3* 7.3*  MG 1.8  --   --   --   --    < > = values in this interval not displayed.   Liver Function Tests: Recent Labs    11/11/23 1336 06/05/24 1034 06/12/24 0300  AST 16 18 13*  ALT 12 8 5   ALKPHOS 98 83 75  BILITOT 0.7 0.5 0.3  PROT 7.4 7.5 5.6*  ALBUMIN 4.0 3.6 3.4*   Recent Labs    11/11/23 1336  LIPASE 36   CBC: Recent Labs    03/03/24 0000 06/05/24 1034 06/06/24 1240 06/07/24 0536 06/08/24 0834 06/12/24 0150  WBC 10.2 14.5*   < > 12.2* 11.5* 15.6*  NEUTROABS 8,007.00 12.5*  --   --   --  13.1*  HGB 13.1 12.9   < > 11.8* 12.3 11.6*  HCT 40 40.3   < > 35.2* 37.1 34.7*  MCV  --  84.5   < > 82.8 83.7 83.2  PLT 348 354   < >  335 301 323   < > = values in this interval not displayed.    Lab Results  Component Value Date   HGBA1C 5.9 (H) 06/05/2024   Lab Results  Component Value Date   TSH 2.44 06/04/2023   Lab Results  Component Value Date   IRON 25 11/08/2023   TIBC 398 11/08/2023   FERRITIN 12 11/08/2023     Imaging and Procedures obtained prior to SNF admission: DG Chest Port 1 View Result Date: 06/12/2024 CLINICAL DATA:  Cough EXAM: PORTABLE CHEST 1 VIEW COMPARISON:  06/05/2024 FINDINGS: Cardiac shadow is stable. Aortic calcifications are noted. Diffuse interstitial changes are seen consistent with edema. No effusion is seen. No focal infiltrate is noted. Postsurgical changes in the cervical spine are seen. IMPRESSION: Interstitial edema. Electronically Signed   By: Oneil Devonshire M.D.   On: 06/12/2024 02:06     Assessment/Plan Assessment & Plan Acute hypoxic respiratory failure [facility]) Patient sent to the ER early this morning  due to worsening hypoxia with reported room air saturations below 88%.  Appears that she is being readmitted back to [facility] for further care.          Family/ staff Communication:   Labs/tests ordered:    [name] Door, DO [facility] [phone]

## 2024-06-12 NOTE — ED Notes (Addendum)
 Pt taken off BiPAP, currently pt is 95% on RA. Unlabored breathing, regular rate, equal chest rise and fall.

## 2024-06-12 NOTE — ED Triage Notes (Addendum)
 Pt arrived via [facility] from [facility] facility in respiratory distress. Per facility staff, pt recently discharged from hospital on Monday with pneumonia. Over course of tonight pt increased in SOB and wheezing. Pt initial room air sats 78%. EMS gave 1 duo-neb in route with non-re breather mask in place. Pt on non-re breather @ 97%.   RT called for BiPap

## 2024-06-12 NOTE — Inpatient Diabetes Management (Signed)
 Inpatient Diabetes Program Recommendations  AACE/ADA: New Consensus Statement on Inpatient Glycemic Control (2015)  Target Ranges:  Prepandial:   less than 140 mg/dL      Peak postprandial:   less than 180 mg/dL (1-2 hours)      Critically ill patients:  140 - 180 mg/dL   Lab Results  Component Value Date   GLUCAP 141 (H) 06/08/2024   HGBA1C 5.9 (H) 06/05/2024    Review of Glycemic Control  Latest Reference Range & Units 06/12/24 03:00 06/12/24 08:12  Glucose 70 - 99 mg/dL 837 (H) 651 (H)   Diabetes history: None Outpatient Diabetes medications:  None Current orders for Inpatient glycemic control:  Solumedrol 80 mg IV daily   Inpatient Diabetes Program Recommendations:    Note elevated lab glucose with steroids.  Consider adding Novolog  0-9 units tid with meals and HS while in the hospital.   Thanks,  Randall Bullocks, RN, BC-ADM Inpatient Diabetes Coordinator Pager [phone]  (8a-5p)

## 2024-06-12 NOTE — ED Provider Notes (Addendum)
 [facility] Provider Note    Event Date/Time   First MD Initiated Contact with Patient 06/12/24 0142     (approximate)   History   Respiratory Distress   HPI  Victoria Medina is a 63 y.o. female   Past medical history of CHF, CAD, stroke, flaccid hemiaplasia, anxiety, depression, hyperlipidemia, hypothyroid, hypertension, OSA, morbid obesity, blind, chronic suprapubic catheter here with respiratory distress from her skilled nursing facility.  She was just discharged on 06/08/2024 for acute hypoxemic respiratory failure due to pneumonia versus aspiration pneumonia.  Her breathing got worse over the last 24 hours.  She feels chest tightness as well.  Independent Historian contributed to assessment above: EMS gives report of hypoxemia to 78% on room air put on nonrebreather with improvement.  They gave a DuoNeb en route.  External Medical Documents Reviewed: Discharge summary from 06/08/2024 for pneumonia      Physical Exam   Triage Vital Signs: ED Triage Vitals [06/12/24 0139]  Encounter Vitals Group     BP      Girls Systolic BP Percentile      Girls Diastolic BP Percentile      Boys Systolic BP Percentile      Boys Diastolic BP Percentile      Pulse      Resp      Temp      Temp src      SpO2 (!) 78 %     Weight      Height      Head Circumference      Peak Flow      Pain Score      Pain Loc      Pain Education      Exclude from Growth Chart     Most recent vital signs: Vitals:   06/12/24 0143 06/12/24 0159  BP:    Pulse: 86 80  Resp: 18   Temp:  98 F (36.7 C)  SpO2: (!) 89% 100%    General: Awake, no distress.  CV:  Good peripheral perfusion. Resp:  Normal effort.  Abd:  No distention.  Other:  No significant peripheral edema, increased work of breathing, and 100% on nonrebreather.  Diffuse wheezing throughout all lung fields with no obvious rales.  No obvious murmur.  Benign abdominal exam.  Speaking in short phrases.   ED  Results / Procedures / Treatments   Labs (all labs ordered are listed, but only abnormal results are displayed) Labs Reviewed  CBC WITH DIFFERENTIAL/PLATELET - Abnormal; Notable for the following components:      Result Value   WBC 15.6 (*)    Hemoglobin 11.6 (*)    HCT 34.7 (*)    Neutro Abs 13.1 (*)    Abs Immature Granulocytes 0.09 (*)    All other components within normal limits  BLOOD GAS, VENOUS - Abnormal; Notable for the following components:   pO2, Ven <31 (*)    All other components within normal limits  RESP PANEL BY RT-PCR (RSV, FLU A&B, COVID)  [id number]  COMPREHENSIVE METABOLIC PANEL WITH GFR  PRO BRAIN NATRIURETIC PEPTIDE  TROPONIN T, HIGH SENSITIVITY     I ordered and reviewed the above labs they are notable for VBG with normal pH, leukocytosis on cell counts.  EKG  ED ECG REPORT I, Ginnie Shams, the attending physician, personally viewed and interpreted this ECG.   Date: 06/12/2024  EKG Time: 0201  Rate: 75  Rhythm: sinus  Axis: nl  Intervals:nl  ST&T Change: no stemi    RADIOLOGY I independently reviewed and interpreted chest x-ray and see diffuse interstitial changes either atypical pneumonia or edema I also reviewed radiologist's formal read.   PROCEDURES:  Critical Care performed: Yes, see critical care procedure note(s)  .Critical Care  Performed by: Cyrena Mylar, MD Authorized by: Cyrena Mylar, MD   Critical care provider statement:    Critical care time (minutes):  45   Critical care was time spent personally by me on the following activities:  Development of treatment plan with patient or surrogate, discussions with consultants, evaluation of patient's response to treatment, examination of patient, ordering and review of laboratory studies, ordering and review of radiographic studies, ordering and performing treatments and interventions, pulse oximetry, re-evaluation of patient's condition and review of old charts    MEDICATIONS ORDERED IN  ED: Medications  ipratropium-albuterol (DUONEB) 0.5-2.5 (3) MG/3ML nebulizer solution 9 mL (9 mLs Nebulization Given 06/12/24 0204)  methylPREDNISolone  sodium succinate  (SOLU-MEDROL ) 125 mg/2 mL injection 125 mg (125 mg Intravenous Given 06/12/24 0203)  furosemide (LASIX) injection 80 mg (80 mg Intravenous Given 06/12/24 0243)    External physician / consultants:  I spoke with hospital medicine for admission and regarding care plan for this patient.   IMPRESSION / MDM / ASSESSMENT AND PLAN / ED COURSE  I reviewed the triage vital signs and the nursing notes.                                Patient's presentation is most consistent with acute presentation with potential threat to life or bodily function.  Differential diagnosis includes, but is not limited to, hypoxemic respiratory failure, respiratory infection, COPD exacerbation, CHF exacerbation, ACS or PE   The patient is on the cardiac monitor to evaluate for evidence of arrhythmia and/or significant heart rate changes.  MDM:    Patient with a recent pneumonia discharged to skilled nursing facility with worsening breathing today found to be hypoxemic needing nonrebreather.  Also increased work of breathing and wheezing throughout consistent with COPD exacerbation or asthma exacerbation, put on BiPAP, given DuoNebs and Solu-Medrol  with much improvement in her breathing.  She also has CHF with most recent echo showing EF in the 20 to 25% range and also evidence of interstitial edema on chest x-ray, may be a component of CHF exacerbation as well.  Given IV Lasix.  Complains of chest tightness may be in the setting of her respiratory illness as above, but should check for ACS as well.  Fortunately EKG nonischemic, will follow-up with serial troponins.  Will require admission.  -- BNP elevated, consistent with CHF exacerbation.  Breathing much improved with initial interventions.  Hyponatremia as well, but with normal mental status, no  seizures, will defer 3%.  Unclear etiology, will continue to follow, with diuresis will recheck labs.  Will defer fluids given her CHF exacerbation.  First troponin 61 I think demand in the setting of her CHF exacerbation rather than primary ACS.  Will follow serial troponins.  Give aspirin .  Admission consult with hospitalist admitting doctor spoken with at 3:45AM      FINAL CLINICAL IMPRESSION(S) / ED DIAGNOSES   Final diagnoses:  Acute hypoxemic respiratory failure (HCC)  Acute on chronic congestive heart failure, unspecified heart failure type (HCC)  Wheezing     Rx / DC Orders   ED Discharge Orders     None  Note:  This document was prepared using Dragon voice recognition software and may include unintentional dictation errors.    Cyrena Mylar, MD 06/12/24 9748    Cyrena Mylar, MD 06/12/24 9657    Cyrena Mylar, MD 06/12/24 [phone]

## 2024-06-12 NOTE — ED Notes (Signed)
 Pt put back on BiPAP by this RN. Pt came down to 83% on RA, unlabored breathing. Pt 97% on BiPAP.

## 2024-06-12 NOTE — ED Notes (Signed)
 Pt taken off BiPAP and put on 2L [place] per MD verbal orders. Pt resting comfortably, unlabored breathing, equal chest rise and fall. SpO2 100% on 2L.

## 2024-06-13 DIAGNOSIS — J9601 Acute respiratory failure with hypoxia: Secondary | ICD-10-CM | POA: Diagnosis not present

## 2024-06-13 LAB — GLUCOSE, CAPILLARY: Glucose-Capillary: 265 mg/dL — ABNORMAL HIGH (ref 70–99)

## 2024-06-13 LAB — CBG MONITORING, ED
Glucose-Capillary: 85 mg/dL (ref 70–99)
Glucose-Capillary: 162 mg/dL — ABNORMAL HIGH (ref 70–99)
Glucose-Capillary: 146 mg/dL — ABNORMAL HIGH (ref 70–99)

## 2024-06-13 LAB — BASIC METABOLIC PANEL WITH GFR
Anion gap: 10 (ref 5–15)
BUN: 8 mg/dL (ref 8–23)
CO2: 28 mmol/L (ref 22–32)
Calcium: 8.8 mg/dL — ABNORMAL LOW (ref 8.9–10.3)
Chloride: 94 mmol/L — ABNORMAL LOW (ref 98–111)
Creatinine, Ser: 0.73 mg/dL (ref 0.44–1.00)
GFR, Estimated: >60 mL/min (ref >60)
Glucose, Bld: 171 mg/dL — ABNORMAL HIGH (ref 70–99)
Potassium: 4.4 mmol/L (ref 3.5–5.1)
Sodium: 132 mmol/L — ABNORMAL LOW (ref 135–145)

## 2024-06-13 LAB — CBC
HCT: 33.5 % — ABNORMAL LOW (ref 36.0–46.0)
Hemoglobin: 11.2 g/dL — ABNORMAL LOW (ref 12.0–15.0)
MCH: 27.8 pg (ref 26.0–34.0)
MCHC: 33.4 g/dL (ref 30.0–36.0)
MCV: 83.1 fL (ref 80.0–100.0)
Platelets: 375 K/uL (ref 150–400)
RBC: 4.03 MIL/uL (ref 3.87–5.11)
RDW: 13.7 % (ref 11.5–15.5)
WBC: 15.5 K/uL — ABNORMAL HIGH (ref 4.0–10.5)
nRBC: 0 % (ref 0.0–0.2)

## 2024-06-13 MED ORDER — FUROSEMIDE 10 MG/ML IJ SOLN
40.0000 mg | Freq: Once | INTRAMUSCULAR | Status: AC
  Administered 2024-06-13: 40 mg via INTRAVENOUS
  Filled 2024-06-13: qty 4

## 2024-06-13 MED ORDER — MORPHINE SULFATE (PF) 2 MG/ML IV SOLN
2.0000 mg | Freq: Once | INTRAVENOUS | Status: AC
  Administered 2024-06-13: 2 mg via INTRAVENOUS
  Filled 2024-06-13: qty 1

## 2024-06-13 NOTE — Progress Notes (Signed)
 [facility] PROGRESS NOTE       Patient ID: Victoria [facility] MRN: [id number] DOB/AGE: May 01, 1961 63 y.o.  Admit date: 06/12/2024 Referring Physician Dr. Katha Gail Primary Physician [name], DO  Primary Cardiologist Dr. Wilburn Reason for Consultation AoCHFrEF  HPI: Victoria [facility] is a 63 y.o. female  with a past medical history of chronic HFrEF, coronary artery disease s/p multiple stents, hypertension, history of stroke with flaccid hemiaplasia, hypothyroidism, morbid obesity, blindness, chronic suprapubic catheter who presented to the ED on 06/12/2024 for respiratory distress.  Suspect heart failure exacerbation.  Cardiology was consulted for further evaluation.   Interval history: -Patient seen and examined this AM, resting in bed.  -States she is feeling much better today. Denies SOB or CP.  -Off BiPAP now on 2L. Great UOP yesterday.  Review of systems complete and found to be negative unless listed above    Past Medical History:  Diagnosis Date   Acute ST elevation myocardial infarction (STEMI) of inferolateral wall (HCC) 01/25/2020   Blind    Cholecystitis 04/22/2021   History of migraine headaches    Hyperlipemia    Myocardial infarction [facility])    Stroke [facility])     Past Surgical History:  Procedure Laterality Date   ABDOMINAL HYSTERECTOMY     BACK SURGERY     CARDIAC CATHETERIZATION     CORONARY/GRAFT ACUTE MI REVASCULARIZATION N/A 01/25/2020   Procedure: Coronary/Graft Acute MI Revascularization;  Surgeon: [name], MD;  Location: [facility] INVASIVE CV LAB;  Service: Cardiovascular;  Laterality: N/A;   FRACTURE SURGERY     IR CATHETER TUBE CHANGE  08/10/2021   LEFT HEART CATH AND CORONARY ANGIOGRAPHY N/A 01/25/2020   Procedure: LEFT HEART CATH AND CORONARY ANGIOGRAPHY;  Surgeon: [name], MD;  Location: [facility] INVASIVE CV LAB;  Service: Cardiovascular;  Laterality: N/A;   SKIN GRAFT Right    TOTAL ABDOMINAL HYSTERECTOMY Bilateral      (Not in a hospital admission)  Social History   Socioeconomic History   Marital status: Single    Spouse name: Not on file   Number of children: Not on file   Years of education: Not on file   Highest education level: Not on file  Occupational History   Not on file  Tobacco Use   Smoking status: Never   Smokeless tobacco: Never  Vaping Use   Vaping status: Never Used  Substance and Sexual Activity   Alcohol use: Yes    Comment: occasional   Drug use: No   Sexual activity: Not Currently  Other Topics Concern   Not on file  Social History Narrative   Not on file   Social Drivers of Health   Financial Resource Strain: Not on file  Food Insecurity: No Food Insecurity (06/13/2024)   Hunger Vital Sign    Worried About Running Out of Food in the Last Year: Never true    Ran Out of Food in the Last Year: Never true  Transportation Needs: No Transportation Needs (06/13/2024)   PRAPARE - Administrator, Civil Service (Medical): No    Lack of Transportation (Non-Medical): No  Physical Activity: Not on file  Stress: Not on file  Social Connections: Not on file  Intimate Partner Violence: Not At Risk (06/13/2024)   Humiliation, Afraid, Rape, and Kick questionnaire    Fear of Current or Ex-Partner: No    Emotionally Abused: No    Physically Abused: No    Sexually Abused: No  Family History  Problem Relation Age of Onset   Heart attack Mother    Hypertension Mother    Hypercholesterolemia Mother    Diabetes Mother    Stroke Father    Heart attack Father    Hypertension Father    Hypercholesterolemia Father    Diabetes Father    Diabetes Maternal Grandmother    Cancer - Other Maternal Grandmother      Vitals:   06/13/24 0500 06/13/24 0530 06/13/24 0900 06/13/24 1001  BP: 96/74   120/81  Pulse: 81 83  81  Resp: 13 16  16   Temp:  98 F (36.7 C)  98.5 F (36.9 C)  TempSrc:  Oral    SpO2: 98% 99%  100%  Weight:      Height:   5' (1.524 m)      PHYSICAL EXAM General: Chronically ill appearing female, well nourished, in no acute distress. HEENT: Normocephalic and atraumatic. Neck: No JVD.  Lungs: Normal respiratory effort on 2L [address]. Crackles bilaterally. Heart: HRRR. Normal S1 and S2 without gallops or murmurs.  Abdomen: Non-distended appearing.  Msk: Normal strength and tone for age. Extremities: Warm and well perfused. No clubbing, cyanosis. Trace edema.  Neuro: Alert and oriented X 3. Psych: Answers questions appropriately.   Labs: Basic Metabolic Panel: Recent Labs    06/12/24 1821 06/13/24 0522  NA 131* 132*  K 3.8 4.4  CL 92* 94*  CO2 27 28  GLUCOSE 171* 171*  BUN 6* 8  CREATININE 0.77 0.73  CALCIUM  9.2 8.8*   Liver Function Tests: Recent Labs    06/12/24 0300  AST 13*  ALT 5  ALKPHOS 75  BILITOT 0.3  PROT 5.6*  ALBUMIN 3.4*   No results for input(s): LIPASE, AMYLASE in the last 72 hours. CBC: Recent Labs    06/12/24 0150 06/13/24 0522  WBC 15.6* 15.5*  NEUTROABS 13.1*  --   HGB 11.6* 11.2*  HCT 34.7* 33.5*  MCV 83.2 83.1  PLT 323 375   Cardiac Enzymes: No results for input(s): CKTOTAL, CKMB, CKMBINDEX, TROPONINIHS in the last 72 hours. BNP: No results for input(s): BNP in the last 72 hours. D-Dimer: No results for input(s): DDIMER in the last 72 hours. Hemoglobin A1C: Recent Labs    06/12/24 1821  HGBA1C 6.1*   Fasting Lipid Panel: No results for input(s): CHOL, HDL, LDLCALC, TRIG, CHOLHDL, LDLDIRECT in the last 72 hours. Thyroid Function Tests: No results for input(s): TSH, T4TOTAL, T3FREE, THYROIDAB in the last 72 hours.  Invalid input(s): FREET3 Anemia Panel: No results for input(s): VITAMINB12, FOLATE, FERRITIN, TIBC, IRON, RETICCTPCT in the last 72 hours.   Radiology: [facility] Chest Port 1 View Result Date: 06/12/2024 CLINICAL DATA:  Cough EXAM: PORTABLE CHEST 1 VIEW COMPARISON:  06/05/2024 FINDINGS: Cardiac shadow is  stable. Aortic calcifications are noted. Diffuse interstitial changes are seen consistent with edema. No effusion is seen. No focal infiltrate is noted. Postsurgical changes in the cervical spine are seen. IMPRESSION: Interstitial edema. Electronically Signed   By: Oneil Devonshire M.D.   On: 06/12/2024 02:06   ECHOCARDIOGRAM COMPLETE Result Date: 06/06/2024    ECHOCARDIOGRAM REPORT   Patient Name:   [facility]ina Date of Exam: 06/06/2024 Medical Rec #:  [id number]    Height:       60.0 in Accession #:    [id number]   Weight:       181.5 lb Date of Birth:  05-13-1961   BSA:  1.791 m Patient Age:    62 years     BP:           128/77 mmHg Patient Gender: F            HR:           96 bpm. Exam Location:  [facility] Procedure: 2D Echo, Cardiac Doppler and Color Doppler (Both Spectral and Color            Flow Doppler were utilized during procedure). Indications:     CHF-acute diastolic I50.31  History:         Patient has prior history of Echocardiogram examinations, most                  recent 01/26/2020. Previous Myocardial Infarction, Stroke; Risk                  Factors:Dyslipidemia.  Sonographer:     Christopher Furnace Referring Phys:  [id number] [facility] Diagnosing Phys: Cara JONETTA Lovelace MD IMPRESSIONS  1. Left ventricular ejection fraction, by estimation, is 20 to 25%. The left ventricle has severely decreased function. The left ventricle demonstrates global hypokinesis. The left ventricular internal cavity size was moderately to severely dilated. There is moderate asymmetric left ventricular hypertrophy of the inferior and basal segments. Left ventricular diastolic parameters are consistent with Grade III diastolic dysfunction (restrictive).  2. Right ventricular systolic function is mildly reduced. The right ventricular size is mildly enlarged.  3. The mitral valve is grossly normal. Mild mitral valve regurgitation.  4. The aortic valve is normal in structure. Aortic valve regurgitation is not visualized. FINDINGS   Left Ventricle: Left ventricular ejection fraction, by estimation, is 20 to 25%. The left ventricle has severely decreased function. The left ventricle demonstrates global hypokinesis. Strain was performed and the global longitudinal strain is indeterminate. The left ventricular internal cavity size was moderately to severely dilated. There is moderate asymmetric left ventricular hypertrophy of the inferior and basal segments. Left ventricular diastolic parameters are consistent with Grade III  diastolic dysfunction (restrictive). Right Ventricle: The right ventricular size is mildly enlarged. No increase in right ventricular wall thickness. Right ventricular systolic function is mildly reduced. Left Atrium: Left atrial size was normal in size. Right Atrium: Right atrial size was normal in size. Pericardium: There is no evidence of pericardial effusion. Mitral Valve: The mitral valve is grossly normal. Mild mitral valve regurgitation. Tricuspid Valve: The tricuspid valve is normal in structure. Tricuspid valve regurgitation is mild. Aortic Valve: The aortic valve is normal in structure. Aortic valve regurgitation is not visualized. Aortic valve mean gradient measures 2.0 mmHg. Aortic valve peak gradient measures 3.7 mmHg. Aortic valve area, by VTI measures 2.44 cm. Pulmonic Valve: The pulmonic valve was normal in structure. Pulmonic valve regurgitation is not visualized. Aorta: The ascending aorta was not well visualized. IAS/Shunts: No atrial level shunt detected by color flow Doppler. Additional Comments: 3D was performed not requiring image post processing on an independent workstation and was indeterminate.  LEFT VENTRICLE PLAX 2D LVIDd:         5.54 cm      Diastology LVIDs:         2.69 cm      LV e' medial:    3.04 cm/s LV PW:         1.49 cm      LV E/e' medial:  29.8 LV IVS:        0.84 cm  LV e' lateral:   6.67 cm/s LVOT diam:     2.00 cm      LV E/e' lateral: 13.6 LV SV:         47 LV SV Index:   26  LVOT Area:     3.14 cm LV IVRT:       131 msec  LV Volumes (MOD) LV vol d, MOD A2C: 111.0 ml LV vol d, MOD A4C: 132.0 ml LV vol s, MOD A2C: 102.0 ml LV vol s, MOD A4C: 110.0 ml LV SV MOD A2C:     9.0 ml LV SV MOD A4C:     132.0 ml LV SV MOD BP:      15.3 ml RIGHT VENTRICLE RV Basal diam:  2.82 cm RV Mid diam:    2.55 cm RV S prime:     11.90 cm/s TAPSE (M-mode): 1.3 cm LEFT ATRIUM             Index        RIGHT ATRIUM           Index LA diam:        3.50 cm 1.95 cm/m   RA Area:     12.20 cm LA Vol (A2C):   26.5 ml 14.79 ml/m  RA Volume:   26.50 ml  14.79 ml/m LA Vol (A4C):   69.8 ml 38.94 ml/m LA Biplane Vol: 24.5 ml 13.68 ml/m  AORTIC VALVE AV Area (Vmax):    2.59 cm AV Area (Vmean):   2.38 cm AV Area (VTI):     2.44 cm AV Vmax:           96.75 cm/s AV Vmean:          67.650 cm/s AV VTI:            0.192 m AV Peak Grad:      3.7 mmHg AV Mean Grad:      2.0 mmHg LVOT Vmax:         79.80 cm/s LVOT Vmean:        51.200 cm/s LVOT VTI:          0.149 m LVOT/AV VTI ratio: 0.78  AORTA Ao Root diam: 2.90 cm MITRAL VALVE               TRICUSPID VALVE MV Area (PHT): 6.07 cm    TR Peak grad:   8.9 mmHg MV Decel Time: 125 msec    TR Vmax:        149.00 cm/s MV E velocity: 90.70 cm/s MV A velocity: 66.50 cm/s  SHUNTS MV E/A ratio:  1.36        Systemic VTI:  0.15 m                            Systemic Diam: 2.00 cm Cara JONETTA Lovelace MD Electronically signed by Cara JONETTA Lovelace MD Signature Date/Time: 06/06/2024/5:02:03 PM    Final    DG Chest Portable 1 View Result Date: 06/05/2024 EXAM: 1 VIEW(S) XRAY OF THE CHEST 06/05/2024 10:57:00 AM COMPARISON: 10/04/2023 CLINICAL HISTORY: aspirated food, feeling SOB. wheezing on exam FINDINGS: LUNGS AND PLEURA: New diffuse interstitial and patchy lung opacities. Relative sparing of the left upper lobe. Trace right pleural effusion. No pneumothorax. HEART AND MEDIASTINUM: Cardiomegaly. Aortic atherosclerotic calcification. BONES AND SOFT TISSUES: No acute osseous abnormality.  IMPRESSION: 1. New diffuse interstitial airspace opacities relatively diffusely. Favor multifocal infection or aspiration over pulmonary  edema. 2. Trace right pleural effusion. 3. Aortic atherosclerosis (icd10-i70.0). Electronically signed by: Rockey Kilts MD 06/05/2024 11:54 AM EST RP Workstation: [id number]    ECHO 06/06/2024: 1. Left ventricular ejection fraction, by estimation, is 20 to 25%. The left ventricle has severely decreased function. The left ventricle demonstrates global hypokinesis. The left ventricular internal cavity size was moderately to severely dilated. There is moderate asymmetric left ventricular hypertrophy of the inferior and basal segments. Left ventricular diastolic parameters are consistent with Grade III diastolic dysfunction (restrictive).   2. Right ventricular systolic function is mildly reduced. The right  ventricular size is mildly enlarged.   3. The mitral valve is grossly normal. Mild mitral valve regurgitation.   4. The aortic valve is normal in structure. Aortic valve regurgitation is  not visualized.   TELEMETRY (personally reviewed): NSR rate 80s  EKG (personally reviewed): Normal sinus rhythm rate is 75 bpm, no acute ischemic changes  Data reviewed by me 06/13/2024: last 24h vitals tele labs imaging I/O ED provider note, admission H&P, hospitalist progress note  Principal Problem:   Acute respiratory failure with hypoxia (HCC)    ASSESSMENT AND PLAN:  [facility] is a 63 y.o. female  with a past medical history of chronic HFrEF, coronary artery disease s/p multiple stents, hypertension, history of stroke with flaccid hemiaplasia, hypothyroidism, morbid obesity, blindness, chronic suprapubic catheter who presented to the ED on 06/12/2024 for respiratory distress.  Suspect heart failure exacerbation.  Cardiology was consulted for further evaluation.   # Acute on chronic HFrEF # Demand ischemia # Coronary artery disease # Hx stroke, hemiplasia Patient  recently admitted for PNA now presenting with SOB and chest tightness worse with talking. BNP elevated at 1956. Troponins 61 > 93. EKG without acute ischemic changes. Started on IV diuresis. Echo last admission with EF 20-25%, global hypokinesis.  -Will continue IV diuresis with lasix 40 mg BID.  -Continue home losartan  100 mg daily. Will plan for further additions to GDMT during admission. Previously has not tolerated beta blockers. -Suspect troponin elevation most consistent with demand/supply mismatch and not ACS in the setting of acute heart failure. Will check one more troponin.   This patient's plan of care was discussed and created with Dr. Ammon and he is in agreement.  Signed: Danita Bloch, PA-C  06/13/2024, 10:13 AM [facility] Cardiology

## 2024-06-13 NOTE — Progress Notes (Signed)
 PROGRESS NOTE    [facility]  FMW:[id number] DOB: Sep 08, 1960 DOA: 06/12/2024 PCP: [name], DO   Assessment & Plan:   Principal Problem:   Acute respiratory failure with hypoxia (HCC)  Assessment and Plan: Acute hypoxic respiratory failure: likely secondary to fluid overload. Continue on IV lasix.   Acute on chronic systolic CHF: continue on IV lasix & continue on losartan . Monitor I/Os. Cardio following and recs apprec   DM2: likely poorly controlled. Continue on SSI w/ accuchecks  Elevated troponins: secondary to demand ischemia as per cardio   HLD: continue on statin  Hypothyroidism: continue on home dose of levothyroxine   GERD: continue on PPI  Chronic urinary retention: w/ chronic suprapubic catheter.   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 Continue on statin, aspirin    Hx of CAD: continue on imdur, losartan , ranexa, statin   HTN: continue on imdur, losartan    Obesity: BMI is 35.11. Would benefit from weight loss    DVT prophylaxis: lovenox  Code Status: DNR Family Communication: Disposition Plan: d/c back to [facility]  Level of care: Progressive Consultants:  Cardio   Procedures:   Antimicrobials:   Subjective: Pt c/o shortness of breath   Objective: Vitals:   06/13/24 0400 06/13/24 0430 06/13/24 0500 06/13/24 0530  BP: 108/75 103/75 96/74   Pulse: 83 82 81 83  Resp: 12 14 13 16   Temp:    98 F (36.7 C)  TempSrc:    Oral  SpO2: 99% 98% 98% 99%  Weight:        Intake/Output Summary (Last 24 hours) at 06/13/2024 9077 Last data filed at 06/12/2024 1405 Gross per 24 hour  Intake --  Output 600 ml  Net -600 ml   Filed Weights   06/12/24 0159  Weight: 82 kg    Examination:  General exam: Appears calm but uncomfortable  Respiratory system: course breath sounds b/l. Crackles b/l Cardiovascular system: S1 & S2 +. No rubs, gallops or clicks.  Gastrointestinal system: Abdomen is obese, soft and nontender.Normal bowel  sounds heard. Central nervous system: Alert and awake Psychiatry: Judgement and insight appears at baseline. Flat mood and affect    Data Reviewed: I have personally reviewed following labs and imaging studies  CBC: Recent Labs  Lab 06/06/24 1240 06/07/24 0536 06/08/24 0834 06/12/24 0150 06/13/24 0522  WBC 14.1* 12.2* 11.5* 15.6* 15.5*  NEUTROABS  --   --   --  13.1*  --   HGB 12.3 11.8* 12.3 11.6* 11.2*  HCT 35.9* 35.2* 37.1 34.7* 33.5*  MCV 81.8 82.8 83.7 83.2 83.1  PLT 352 335 301 323 375   Basic Metabolic Panel: Recent Labs  Lab 06/07/24 0536 06/12/24 0300 06/12/24 0812 06/12/24 1821 06/13/24 0522  NA 133* 121* 129* 131* 132*  K 3.6 3.5 3.5 3.8 4.4  CL 99 90* 91* 92* 94*  CO2 24 20* 27 27 28   GLUCOSE 118* 162* 348* 171* 171*  BUN 18 7* 6* 6* 8  CREATININE 0.89 0.74 0.84 0.77 0.73  CALCIUM  8.5* 8.3* 7.3* 9.2 8.8*   GFR: Estimated Creatinine Clearance: 69.2 mL/min (by C-G formula based on SCr of 0.73 mg/dL). Liver Function Tests: Recent Labs  Lab 06/12/24 0300  AST 13*  ALT 5  ALKPHOS 75  BILITOT 0.3  PROT 5.6*  ALBUMIN 3.4*   No results for input(s): LIPASE, AMYLASE in the last 168 hours. No results for input(s): AMMONIA in the last 168 hours. Coagulation Profile: No results for input(s): INR, PROTIME in  the last 168 hours. Cardiac Enzymes: No results for input(s): CKTOTAL, CKMB, CKMBINDEX, TROPONINI in the last 168 hours. BNP (last 3 results) Recent Labs    06/12/24 0300  PROBNP 1,956.0*   HbA1C: Recent Labs    06/12/24 1821  HGBA1C 6.1*   CBG: Recent Labs  Lab 06/08/24 1055 06/12/24 1235 06/12/24 1802 06/12/24 2153 06/13/24 0852  GLUCAP 141* 214* 152* 194* 85   Lipid Profile: No results for input(s): CHOL, HDL, LDLCALC, TRIG, CHOLHDL, LDLDIRECT in the last 72 hours. Thyroid Function Tests: No results for input(s): TSH, T4TOTAL, FREET4, T3FREE, THYROIDAB in the last 72 hours. Anemia  Panel: No results for input(s): VITAMINB12, FOLATE, FERRITIN, TIBC, IRON, RETICCTPCT in the last 72 hours. Sepsis Labs: Recent Labs  Lab 06/06/24 1240  PROCALCITON <0.10    Recent Results (from the past 240 hours)  Resp panel by RT-PCR (RSV, Flu A&B, Covid) Anterior Nasal Swab     Status: None   Collection Time: 06/12/24  1:36 AM   Specimen: Anterior Nasal Swab  Result Value Ref Range Status   SARS Coronavirus 2 by RT PCR NEGATIVE NEGATIVE Final    Comment: (NOTE) SARS-CoV-2 target nucleic acids are NOT DETECTED.  The SARS-CoV-2 RNA is generally detectable in upper respiratory specimens during the acute phase of infection. The lowest concentration of SARS-CoV-2 viral copies this assay can detect is 138 copies/mL. A negative result does not preclude SARS-Cov-2 infection and should not be used as the sole basis for treatment or other patient management decisions. A negative result may occur with  improper specimen collection/handling, submission of specimen other than nasopharyngeal swab, presence of viral mutation(s) within the areas targeted by this assay, and inadequate number of viral copies(<138 copies/mL). A negative result must be combined with clinical observations, patient history, and epidemiological information. The expected result is Negative.  Fact Sheet for Patients:  bloggercourse.com  Fact Sheet for Healthcare Providers:  seriousbroker.it  This test is no t yet approved or cleared by the [place]  FDA and  has been authorized for detection and/or diagnosis of SARS-CoV-2 by FDA under an Emergency Use Authorization (EUA). This EUA will remain  in effect (meaning this test can be used) for the duration of the COVID-19 declaration under Section 564(b)(1) of the Act, 21 U.S.C.section 360bbb-3(b)(1), unless the authorization is terminated  or revoked sooner.       Influenza A by PCR NEGATIVE  NEGATIVE Final   Influenza B by PCR NEGATIVE NEGATIVE Final    Comment: (NOTE) The Xpert Xpress SARS-CoV-2/FLU/RSV plus assay is intended as an aid in the diagnosis of influenza from Nasopharyngeal swab specimens and should not be used as a sole basis for treatment. Nasal washings and aspirates are unacceptable for Xpert Xpress SARS-CoV-2/FLU/RSV testing.  Fact Sheet for Patients: bloggercourse.com  Fact Sheet for Healthcare Providers: seriousbroker.it  This test is not yet approved or cleared by the [place]  FDA and has been authorized for detection and/or diagnosis of SARS-CoV-2 by FDA under an Emergency Use Authorization (EUA). This EUA will remain in effect (meaning this test can be used) for the duration of the COVID-19 declaration under Section 564(b)(1) of the Act, 21 U.S.C. section 360bbb-3(b)(1), unless the authorization is terminated or revoked.     Resp Syncytial Virus by PCR NEGATIVE NEGATIVE Final    Comment: (NOTE) Fact Sheet for Patients: bloggercourse.com  Fact Sheet for Healthcare Providers: seriousbroker.it  This test is not yet approved or cleared by the [place]  FDA and has been authorized  for detection and/or diagnosis of SARS-CoV-2 by FDA under an Emergency Use Authorization (EUA). This EUA will remain in effect (meaning this test can be used) for the duration of the COVID-19 declaration under Section 564(b)(1) of the Act, 21 U.S.C. section 360bbb-3(b)(1), unless the authorization is terminated or revoked.  Performed at [facility], [address], [place], [place] [address]          Radiology Studies: DG Chest [place] 1 View Result Date: 06/12/2024 CLINICAL DATA:  Cough EXAM: PORTABLE CHEST 1 VIEW COMPARISON:  06/05/2024 FINDINGS: Cardiac shadow is stable. Aortic calcifications are noted. Diffuse interstitial changes are seen  consistent with edema. No effusion is seen. No focal infiltrate is noted. Postsurgical changes in the cervical spine are seen. IMPRESSION: Interstitial edema. Electronically Signed   By: Oneil Devonshire M.D.   On: 06/12/2024 02:06        Scheduled Meds:  aspirin  EC  81 mg Oral Daily   enoxaparin  (LOVENOX ) injection  40 mg Subcutaneous Q24H   fluticasone   1 spray Each Nare BID   furosemide  40 mg Intravenous Q12H   insulin  aspart  0-15 Units Subcutaneous TID WC   insulin  aspart  0-5 Units Subcutaneous QHS   ipratropium-albuterol  3 mL Nebulization QID   isosorbide mononitrate  60 mg Oral Daily   levothyroxine   25 mcg Oral QAC breakfast   losartan   100 mg Oral Daily   methylPREDNISolone  (SOLU-MEDROL ) injection  80 mg Intravenous Q24H   mirabegron  ER  25 mg Oral Daily   OLANZapine   5 mg Oral Daily   pantoprazole   20 mg Oral Daily   polyethylene glycol  17 g Oral Daily   ranolazine  500 mg Oral BID   rosuvastatin  5 mg Oral Once per day on Tuesday Thursday Saturday   senna-docusate  2 tablet Oral QHS   Continuous Infusions:   LOS: 1 day      Anthony CHRISTELLA Pouch, MD [facility] Pager [phone]  If 7PM-7AM, please contact night-coverage [email or web address] 06/13/2024, 9:22 AM

## 2024-06-14 DIAGNOSIS — J9601 Acute respiratory failure with hypoxia: Secondary | ICD-10-CM | POA: Diagnosis not present

## 2024-06-14 LAB — BASIC METABOLIC PANEL WITH GFR
Anion gap: 11 (ref 5–15)
BUN: 18 mg/dL (ref 8–23)
CO2: 29 mmol/L (ref 22–32)
Calcium: 9.1 mg/dL (ref 8.9–10.3)
Chloride: 91 mmol/L — ABNORMAL LOW (ref 98–111)
Creatinine, Ser: 0.86 mg/dL (ref 0.44–1.00)
GFR, Estimated: >60 mL/min (ref >60)
Glucose, Bld: 212 mg/dL — ABNORMAL HIGH (ref 70–99)
Potassium: 4.2 mmol/L (ref 3.5–5.1)
Sodium: 131 mmol/L — ABNORMAL LOW (ref 135–145)

## 2024-06-14 LAB — CBC
HCT: 36.2 % (ref 36.0–46.0)
Hemoglobin: 12.2 g/dL (ref 12.0–15.0)
MCH: 27.5 pg (ref 26.0–34.0)
MCHC: 33.7 g/dL (ref 30.0–36.0)
MCV: 81.7 fL (ref 80.0–100.0)
Platelets: 391 K/uL (ref 150–400)
RBC: 4.43 MIL/uL (ref 3.87–5.11)
RDW: 14 % (ref 11.5–15.5)
WBC: 18.4 K/uL — ABNORMAL HIGH (ref 4.0–10.5)
nRBC: 0 % (ref 0.0–0.2)

## 2024-06-14 LAB — BLOOD GAS, VENOUS
Acid-Base Excess: 0.5 mmol/L (ref 0.0–2.0)
Bicarbonate: 27 mmol/L (ref 20.0–28.0)
Delivery systems: POSITIVE
FIO2: 30 %
O2 Saturation: 35.7 %
Patient temperature: 37
pCO2, Ven: 50 mmHg (ref 44–60)
pH, Ven: 7.34 (ref 7.25–7.43)

## 2024-06-14 LAB — PROCALCITONIN: Procalcitonin: <0.1 ng/mL

## 2024-06-14 LAB — GLUCOSE, CAPILLARY
Glucose-Capillary: 139 mg/dL — ABNORMAL HIGH (ref 70–99)
Glucose-Capillary: 116 mg/dL — ABNORMAL HIGH (ref 70–99)
Glucose-Capillary: 242 mg/dL — ABNORMAL HIGH (ref 70–99)
Glucose-Capillary: 274 mg/dL — ABNORMAL HIGH (ref 70–99)

## 2024-06-14 MED ORDER — IPRATROPIUM-ALBUTEROL 0.5-2.5 (3) MG/3ML IN SOLN
3.0000 mL | Freq: Two times a day (BID) | RESPIRATORY_TRACT | Status: DC
  Administered 2024-06-14: 3 mL via RESPIRATORY_TRACT
  Filled 2024-06-14 (×2): qty 3

## 2024-06-14 NOTE — Plan of Care (Signed)

## 2024-06-14 NOTE — Progress Notes (Signed)
 PROGRESS NOTE    Victoria Medina  FMW:[id number] DOB: 05/18/1961 DOA: 06/12/2024 PCP: [name], DO   Assessment & Plan:   Principal Problem:   Acute respiratory failure with hypoxia (HCC)  Assessment and Plan: Acute hypoxic respiratory failure: likely secondary to fluid overload. Continue on IV lasix. Weaned off of supplemental oxygen today   Acute on chronic systolic CHF: continue on IV lasix. Monitor I/Os. Continue on home dose of losartan . Cardio following and recs apprec   DM2: likely poorly controlled. Continue on SSI w/ accuchecks  Elevated troponins: secondary to demand ischemia as per cardio   HLD: continue on statin   Hypothyroidism: continue on home dose of levothyroxine    GERD: continue on PPI   Chronic urinary retention: w/ chronic suprapubic catheter.   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 Continue on aspirin , statin   Hx of CAD: continue on home dose of ranexa, imdur, losartan , statin   HTN: continue on losartan , imdur  Obesity: BMI is 35.11. Would benefit from weight loss    DVT prophylaxis: lovenox  Code Status: DNR Family Communication: Disposition Plan: d/c back to [facility]  Level of care: Progressive Consultants:  Cardio   Procedures:   Antimicrobials:   Subjective: Pt c/o smelling bad  Objective: Vitals:   06/14/24 0009 06/14/24 0347 06/14/24 0738 06/14/24 0807  BP: 125/88 101/72  102/70  Pulse: 95 76  81  Resp: 19 18 14 16   Temp: (!) 97.2 F (36.2 C) 97.7 F (36.5 C)  97.7 F (36.5 C)  TempSrc:    Oral  SpO2: 97% 100% 100% 100%  Weight:      Height:        Intake/Output Summary (Last 24 hours) at 06/14/2024 0924 Last data filed at 06/14/2024 0100 Gross per 24 hour  Intake --  Output 2100 ml  Net -2100 ml   Filed Weights   06/12/24 0159  Weight: 82 kg    Examination:  General exam: appears uncomfortable  Respiratory system: diminished breath sounds b/l  Cardiovascular system: S1 &S2+. No rubs or  clicks Gastrointestinal system: abd is soft, NT, obese & hypoactive bowel sounds  Central nervous system: Alert & awake Psychiatry: Judgement and insight appears at baseline. Flat mood and affect    Data Reviewed: I have personally reviewed following labs and imaging studies  CBC: Recent Labs  Lab 06/08/24 0834 06/12/24 0150 06/13/24 0522 06/14/24 0405  WBC 11.5* 15.6* 15.5* 18.4*  NEUTROABS  --  13.1*  --   --   HGB 12.3 11.6* 11.2* 12.2  HCT 37.1 34.7* 33.5* 36.2  MCV 83.7 83.2 83.1 81.7  PLT 301 323 375 391   Basic Metabolic Panel: Recent Labs  Lab 06/12/24 0300 06/12/24 0812 06/12/24 1821 06/13/24 0522 06/14/24 0405  NA 121* 129* 131* 132* 131*  K 3.5 3.5 3.8 4.4 4.2  CL 90* 91* 92* 94* 91*  CO2 20* 27 27 28 29   GLUCOSE 162* 348* 171* 171* 212*  BUN 7* 6* 6* 8 18  CREATININE 0.74 0.84 0.77 0.73 0.86  CALCIUM  8.3* 7.3* 9.2 8.8* 9.1   GFR: Estimated Creatinine Clearance: 64.4 mL/min (by C-G formula based on SCr of 0.86 mg/dL). Liver Function Tests: Recent Labs  Lab 06/12/24 0300  AST 13*  ALT 5  ALKPHOS 75  BILITOT 0.3  PROT 5.6*  ALBUMIN 3.4*   No results for input(s): LIPASE, AMYLASE in the last 168 hours. No results for input(s): AMMONIA in the last 168 hours. Coagulation  Profile: No results for input(s): INR, PROTIME in the last 168 hours. Cardiac Enzymes: No results for input(s): CKTOTAL, CKMB, CKMBINDEX, TROPONINI in the last 168 hours. BNP (last 3 results) Recent Labs    06/12/24 0300  PROBNP 1,956.0*   HbA1C: Recent Labs    06/12/24 1821  HGBA1C 6.1*   CBG: Recent Labs  Lab 06/13/24 0852 06/13/24 1146 06/13/24 1650 06/13/24 2120 06/14/24 0810  GLUCAP 85 162* 146* 265* 139*   Lipid Profile: No results for input(s): CHOL, HDL, LDLCALC, TRIG, CHOLHDL, LDLDIRECT in the last 72 hours. Thyroid Function Tests: No results for input(s): TSH, T4TOTAL, FREET4, T3FREE, THYROIDAB in the last 72  hours. Anemia Panel: No results for input(s): VITAMINB12, FOLATE, FERRITIN, TIBC, IRON, RETICCTPCT in the last 72 hours. Sepsis Labs: No results for input(s): PROCALCITON, LATICACIDVEN in the last 168 hours.   Recent Results (from the past 240 hours)  Resp panel by RT-PCR (RSV, Flu A&B, Covid) Anterior Nasal Swab     Status: None   Collection Time: 06/12/24  1:36 AM   Specimen: Anterior Nasal Swab  Result Value Ref Range Status   SARS Coronavirus 2 by RT PCR NEGATIVE NEGATIVE Final    Comment: (NOTE) SARS-CoV-2 target nucleic acids are NOT DETECTED.  The SARS-CoV-2 RNA is generally detectable in upper respiratory specimens during the acute phase of infection. The lowest concentration of SARS-CoV-2 viral copies this assay can detect is 138 copies/mL. A negative result does not preclude SARS-Cov-2 infection and should not be used as the sole basis for treatment or other patient management decisions. A negative result may occur with  improper specimen collection/handling, submission of specimen other than nasopharyngeal swab, presence of viral mutation(s) within the areas targeted by this assay, and inadequate number of viral copies(<138 copies/mL). A negative result must be combined with clinical observations, patient history, and epidemiological information. The expected result is Negative.  Fact Sheet for Patients:  bloggercourse.com  Fact Sheet for Healthcare Providers:  seriousbroker.it  This test is no t yet approved or cleared by the [place]  FDA and  has been authorized for detection and/or diagnosis of SARS-CoV-2 by FDA under an Emergency Use Authorization (EUA). This EUA will remain  in effect (meaning this test can be used) for the duration of the COVID-19 declaration under Section 564(b)(1) of the Act, 21 U.S.C.section 360bbb-3(b)(1), unless the authorization is terminated  or revoked sooner.        Influenza A by PCR NEGATIVE NEGATIVE Final   Influenza B by PCR NEGATIVE NEGATIVE Final    Comment: (NOTE) The Xpert Xpress SARS-CoV-2/FLU/RSV plus assay is intended as an aid in the diagnosis of influenza from Nasopharyngeal swab specimens and should not be used as a sole basis for treatment. Nasal washings and aspirates are unacceptable for Xpert Xpress SARS-CoV-2/FLU/RSV testing.  Fact Sheet for Patients: bloggercourse.com  Fact Sheet for Healthcare Providers: seriousbroker.it  This test is not yet approved or cleared by the [place]  FDA and has been authorized for detection and/or diagnosis of SARS-CoV-2 by FDA under an Emergency Use Authorization (EUA). This EUA will remain in effect (meaning this test can be used) for the duration of the COVID-19 declaration under Section 564(b)(1) of the Act, 21 U.S.C. section 360bbb-3(b)(1), unless the authorization is terminated or revoked.     Resp Syncytial Virus by PCR NEGATIVE NEGATIVE Final    Comment: (NOTE) Fact Sheet for Patients: bloggercourse.com  Fact Sheet for Healthcare Providers: seriousbroker.it  This test is not yet approved or cleared  by the [place]  FDA and has been authorized for detection and/or diagnosis of SARS-CoV-2 by FDA under an Emergency Use Authorization (EUA). This EUA will remain in effect (meaning this test can be used) for the duration of the COVID-19 declaration under Section 564(b)(1) of the Act, 21 U.S.C. section 360bbb-3(b)(1), unless the authorization is terminated or revoked.  Performed at [facility], [address], [place], [place] [address]          Radiology Studies: No results found.       Scheduled Meds:  aspirin  EC  81 mg Oral Daily   enoxaparin  (LOVENOX ) injection  40 mg Subcutaneous Q24H   fluticasone   1 spray Each Nare BID   furosemide  40  mg Intravenous Q12H   insulin  aspart  0-15 Units Subcutaneous TID WC   insulin  aspart  0-5 Units Subcutaneous QHS   ipratropium-albuterol  3 mL Nebulization QID   isosorbide mononitrate  60 mg Oral Daily   levothyroxine   25 mcg Oral QAC breakfast   losartan   100 mg Oral Daily   methylPREDNISolone  (SOLU-MEDROL ) injection  80 mg Intravenous Q24H   mirabegron  ER  25 mg Oral Daily   OLANZapine   5 mg Oral Daily   pantoprazole   20 mg Oral Daily   polyethylene glycol  17 g Oral Daily   ranolazine  500 mg Oral BID   rosuvastatin  5 mg Oral Once per day on Tuesday Thursday Saturday   senna-docusate  2 tablet Oral QHS   Continuous Infusions:   LOS: 2 days      Anthony CHRISTELLA Pouch, MD [facility] Pager [phone]  If 7PM-7AM, please contact night-coverage [email or web address] 06/14/2024, 9:24 AM

## 2024-06-14 NOTE — Progress Notes (Signed)
 Patient's O2 SATs decreased to the 70's while on 5 L . Patient exhibited increase work of breathing with the use of accessory muscles and reported chest pain.  An EKG was obtained, showing sinus tachycardia with HR in 120's Provider was notified and new ordered were received(see MAR). BIPAP was ordered and initiated. Current O2 97% on BIPAP and patient is breathing without the use of accessory muscles. Will continue to closely monitor the patient.

## 2024-06-14 NOTE — Plan of Care (Signed)
 ?  Problem: Elimination: ?Goal: Will not experience complications related to urinary retention ?Outcome: Progressing ?  ?

## 2024-06-14 NOTE — Progress Notes (Signed)
 Patient ID: Victoria Medina, female   DOB: Feb 25, 1961, 63 y.o.   MRN: [id number] [facility] Cardiology    SUBJECTIVE: Resting comfortably in bed denies any pain no significant shortness of breath no chest pain diuresing well   Vitals:   06/14/24 0009 06/14/24 0347 06/14/24 0738 06/14/24 0807  BP: 125/88 101/72  102/70  Pulse: 95 76  81  Resp: 19 18 14 16   Temp: (!) 97.2 F (36.2 C) 97.7 F (36.5 C)  97.7 F (36.5 C)  TempSrc:    Oral  SpO2: 97% 100% 100% 100%  Weight:      Height:         Intake/Output Summary (Last 24 hours) at 06/14/2024 9095 Last data filed at 06/14/2024 0100 Gross per 24 hour  Intake --  Output 2100 ml  Net -2100 ml      PHYSICAL EXAM  General: Well developed, well nourished, in no acute distress HEENT:  Normocephalic and atramatic Neck:  No JVD.  Lungs: Clear bilaterally to auscultation and percussion. Heart: HRRR . Normal S1 and S2 without gallops or murmurs.  Abdomen: Bowel sounds are positive, abdomen soft and non-tender  Msk:  Back normal, normal gait. Normal strength and tone for age. Extremities: No clubbing, cyanosis or edema.   Neuro: Alert and oriented X 3. Psych:  Good affect, responds appropriately   LABS: Basic Metabolic Panel: Recent Labs    06/13/24 0522 06/14/24 0405  NA 132* 131*  K 4.4 4.2  CL 94* 91*  CO2 28 29  GLUCOSE 171* 212*  BUN 8 18  CREATININE 0.73 0.86  CALCIUM  8.8* 9.1   Liver Function Tests: Recent Labs    06/12/24 0300  AST 13*  ALT 5  ALKPHOS 75  BILITOT 0.3  PROT 5.6*  ALBUMIN 3.4*   No results for input(s): LIPASE, AMYLASE in the last 72 hours. CBC: Recent Labs    06/12/24 0150 06/13/24 0522 06/14/24 0405  WBC 15.6* 15.5* 18.4*  NEUTROABS 13.1*  --   --   HGB 11.6* 11.2* 12.2  HCT 34.7* 33.5* 36.2  MCV 83.2 83.1 81.7  PLT 323 375 391   Cardiac Enzymes: No results for input(s): CKTOTAL, CKMB, CKMBINDEX, TROPONINI in the last 72 hours. BNP: Invalid input(s):  POCBNP D-Dimer: No results for input(s): DDIMER in the last 72 hours. Hemoglobin A1C: Recent Labs    06/12/24 1821  HGBA1C 6.1*   Fasting Lipid Panel: No results for input(s): CHOL, HDL, LDLCALC, TRIG, CHOLHDL, LDLDIRECT in the last 72 hours. Thyroid Function Tests: No results for input(s): TSH, T4TOTAL, T3FREE, THYROIDAB in the last 72 hours.  Invalid input(s): FREET3 Anemia Panel: No results for input(s): VITAMINB12, FOLATE, FERRITIN, TIBC, IRON, RETICCTPCT in the last 72 hours.  No results found.   Echo severely depressed left ventricular function EF of 20 to 25% restrictive diastolic dysfunction with elevated  TELEMETRY: Normal sinus rhythm rate of 75 nonspecific ST changes:  ASSESSMENT AND PLAN:  Principal Problem:   Acute respiratory failure with hypoxia (HCC) History of acute ST elevation myocardial infarction in 2021 Patient is legally blind Hyperlipidemia CVA Cardiomyopathy ejection fraction between 20-25%   Plan Acute on chronic HFrEF reduced left ventricular function EF of 20 to 25% continue current medical therapy History of CVA with persistent hemiaplasia Myocardial injury non-MI with underlying coronary disease Known coronary disease previous myocardial infarction in the past Congestive heart failure continue IV diuresis with Lasix GDMT for congestive heart failure cardiomyopathy consider ACE ARB or Arni SGLT2 MRA patient  has been unable to tolerate beta-blockers in the past   Victoria JONETTA Lovelace, MD, 06/14/2024 9:04 AM

## 2024-06-15 DIAGNOSIS — I5023 Acute on chronic systolic (congestive) heart failure: Secondary | ICD-10-CM

## 2024-06-15 LAB — GLUCOSE, CAPILLARY
Glucose-Capillary: 173 mg/dL — ABNORMAL HIGH (ref 70–99)
Glucose-Capillary: 133 mg/dL — ABNORMAL HIGH (ref 70–99)
Glucose-Capillary: 205 mg/dL — ABNORMAL HIGH (ref 70–99)
Glucose-Capillary: 242 mg/dL — ABNORMAL HIGH (ref 70–99)

## 2024-06-15 LAB — BASIC METABOLIC PANEL WITH GFR
Anion gap: 11 (ref 5–15)
BUN: 27 mg/dL — ABNORMAL HIGH (ref 8–23)
CO2: 30 mmol/L (ref 22–32)
Calcium: 9 mg/dL (ref 8.9–10.3)
Chloride: 91 mmol/L — ABNORMAL LOW (ref 98–111)
Creatinine, Ser: 0.79 mg/dL (ref 0.44–1.00)
GFR, Estimated: >60 mL/min (ref >60)
Glucose, Bld: 186 mg/dL — ABNORMAL HIGH (ref 70–99)
Potassium: 4 mmol/L (ref 3.5–5.1)
Sodium: 131 mmol/L — ABNORMAL LOW (ref 135–145)

## 2024-06-15 LAB — CBC
HCT: 37.1 % (ref 36.0–46.0)
Hemoglobin: 12.3 g/dL (ref 12.0–15.0)
MCH: 27.4 pg (ref 26.0–34.0)
MCHC: 33.2 g/dL (ref 30.0–36.0)
MCV: 82.6 fL (ref 80.0–100.0)
Platelets: 385 K/uL (ref 150–400)
RBC: 4.49 MIL/uL (ref 3.87–5.11)
RDW: 13.7 % (ref 11.5–15.5)
WBC: 15.9 K/uL — ABNORMAL HIGH (ref 4.0–10.5)
nRBC: 0 % (ref 0.0–0.2)

## 2024-06-15 MED ORDER — METHYLPREDNISOLONE SODIUM SUCC 125 MG IJ SOLR
60.0000 mg | INTRAMUSCULAR | Status: DC
  Administered 2024-06-15 – 2024-06-16 (×2): 60 mg via INTRAVENOUS
  Filled 2024-06-15 (×2): qty 2

## 2024-06-15 MED ORDER — IPRATROPIUM-ALBUTEROL 0.5-2.5 (3) MG/3ML IN SOLN: 3.0000 mL | Freq: Four times a day (QID) | RESPIRATORY_TRACT | Status: DC | PRN

## 2024-06-15 NOTE — Plan of Care (Signed)
 Problem: Education: Goal: Ability to describe self-care measures that may prevent or decrease complications (Diabetes Survival Skills Education) will improve 06/15/2024 0700 by Orlinda Pool, RN Outcome: Progressing 06/15/2024 0700 by Orlinda Pool, RN Outcome: Progressing Goal: Individualized Educational Video(s) 06/15/2024 0700 by Orlinda Pool, RN Outcome: Progressing 06/15/2024 0700 by Orlinda Pool, RN Outcome: Progressing   Problem: Coping: Goal: Ability to adjust to condition or change in health will improve 06/15/2024 0700 by Orlinda Pool, RN Outcome: Progressing 06/15/2024 0700 by Orlinda Pool, RN Outcome: Progressing   Problem: Fluid Volume: Goal: Ability to maintain a balanced intake and output will improve 06/15/2024 0700 by Orlinda Pool, RN Outcome: Progressing 06/15/2024 0700 by Orlinda Pool, RN Outcome: Progressing   Problem: Health Behavior/Discharge Planning: Goal: Ability to identify and utilize available resources and services will improve 06/15/2024 0700 by Orlinda Pool, RN Outcome: Progressing 06/15/2024 0700 by Orlinda Pool, RN Outcome: Progressing Goal: Ability to manage health-related needs will improve 06/15/2024 0700 by Orlinda Pool, RN Outcome: Progressing 06/15/2024 0700 by Orlinda Pool, RN Outcome: Progressing   Problem: Metabolic: Goal: Ability to maintain appropriate glucose levels will improve 06/15/2024 0700 by Orlinda Pool, RN Outcome: Progressing 06/15/2024 0700 by Orlinda Pool, RN Outcome: Progressing   Problem: Nutritional: Goal: Maintenance of adequate nutrition will improve 06/15/2024 0700 by Orlinda Pool, RN Outcome: Progressing 06/15/2024 0700 by Orlinda Pool, RN Outcome: Progressing Goal: Progress toward achieving an optimal weight will improve 06/15/2024 0700 by Orlinda Pool, RN Outcome: Progressing 06/15/2024 0700 by Orlinda Pool, RN Outcome: Progressing   Problem: Skin Integrity: Goal: Risk for impaired skin integrity will decrease 06/15/2024 0700 by Orlinda Pool, RN Outcome: Progressing 06/15/2024 0700 by Orlinda Pool, RN Outcome: Progressing   Problem: Tissue Perfusion: Goal: Adequacy of tissue perfusion will improve 06/15/2024 0700 by Orlinda Pool, RN Outcome: Progressing 06/15/2024 0700 by Orlinda Pool, RN Outcome: Progressing   Problem: Education: Goal: Knowledge of General Education information will improve Description: Including pain rating scale, medication(s)/side effects and non-pharmacologic comfort measures 06/15/2024 0700 by Orlinda Pool, RN Outcome: Progressing 06/15/2024 0700 by Orlinda Pool, RN Outcome: Progressing   Problem: Health Behavior/Discharge Planning: Goal: Ability to manage health-related needs will improve 06/15/2024 0700 by Orlinda Pool, RN Outcome: Progressing 06/15/2024 0700 by Orlinda Pool, RN Outcome: Progressing   Problem: Clinical Measurements: Goal: Ability to maintain clinical measurements within normal limits will improve 06/15/2024 0700 by Orlinda Pool, RN Outcome: Progressing 06/15/2024 0700 by Orlinda Pool, RN Outcome: Progressing Goal: Will remain free from infection 06/15/2024 0700 by Orlinda Pool, RN Outcome: Progressing 06/15/2024 0700 by Orlinda Pool, RN Outcome: Progressing Goal: Diagnostic test results will improve 06/15/2024 0700 by Orlinda Pool, RN Outcome: Progressing 06/15/2024 0700 by Orlinda Pool, RN Outcome: Progressing Goal: Respiratory complications will improve 06/15/2024 0700 by Orlinda Pool, RN Outcome: Progressing 06/15/2024 0700 by Orlinda Pool, RN Outcome: Progressing Goal: Cardiovascular complication will be  avoided 06/15/2024 0700 by Orlinda Pool, RN Outcome: Progressing 06/15/2024 0700 by Orlinda Pool, RN Outcome: Progressing   Problem: Activity: Goal: Risk for activity intolerance will decrease 06/15/2024 0700 by Orlinda Pool, RN Outcome: Progressing 06/15/2024 0700 by Orlinda Pool, RN Outcome: Progressing   Problem: Nutrition: Goal: Adequate nutrition will be maintained 06/15/2024 0700 by Orlinda Pool, RN Outcome: Progressing 06/15/2024 0700 by Orlinda Pool, RN Outcome: Progressing   Problem: Coping: Goal: Level of anxiety will decrease 06/15/2024 0700 by Orlinda Pool, RN Outcome: Progressing 06/15/2024 0700 by Orlinda Pool, RN Outcome: Progressing   Problem: Elimination: Goal: Will not experience complications related to bowel motility 06/15/2024 0700  by Orlinda Pool, RN Outcome: Progressing 06/15/2024 0700 by Orlinda Pool, RN Outcome: Progressing Goal: Will not experience complications related to urinary retention 06/15/2024 0700 by Orlinda Pool, RN Outcome: Progressing 06/15/2024 0700 by Orlinda Pool, RN Outcome: Progressing   Problem: Pain Managment: Goal: General experience of comfort will improve and/or be controlled 06/15/2024 0700 by Orlinda Pool, RN Outcome: Progressing 06/15/2024 0700 by Orlinda Pool, RN Outcome: Progressing   Problem: Safety: Goal: Ability to remain free from injury will improve 06/15/2024 0700 by Orlinda Pool, RN Outcome: Progressing 06/15/2024 0700 by Orlinda Pool, RN Outcome: Progressing   Problem: Skin Integrity: Goal: Risk for impaired skin integrity will decrease 06/15/2024 0700 by Orlinda Pool, RN Outcome: Progressing 06/15/2024 0700 by Orlinda Pool, RN Outcome: Progressing

## 2024-06-15 NOTE — Care Management Important Message (Signed)
 Important Message  Patient Details  Name: Quinisha Mould MRN: [id number] Date of Birth: 01-Dec-1960   Important Message Given:  Yes - Medicare IM     Rojelio SHAUNNA Rattler 06/15/2024, 6:07 PM

## 2024-06-15 NOTE — Plan of Care (Signed)

## 2024-06-15 NOTE — Progress Notes (Signed)
 Patient ID: Victoria Medina, female   DOB: [birth year], 63 y.o.   MRN: [id number] [facility] Cardiology    SUBJECTIVE: Patient resting comfortably in bed states she needs help because of her comorbid diseases resident at [facility] is eager to be discharged when possible   Vitals:   06/14/24 2359 06/15/24 0359 06/15/24 0805 06/15/24 0841  BP: 124/88 (!) 133/92  119/82  Pulse: 92 81 83 87  Resp: 17 17 16 17   Temp: 98.5 F (36.9 C) 98.5 F (36.9 C)  99 F (37.2 C)  TempSrc:    Oral  SpO2: 94% 97% 91% 93%  Weight:      Height:         Intake/Output Summary (Last 24 hours) at 06/15/2024 0959 Last data filed at 06/15/2024 9066 Gross per 24 hour  Intake 480 ml  Output 2700 ml  Net -2220 ml      PHYSICAL EXAM  General: Well developed, well nourished, in no acute distress HEENT:  Normocephalic and atramatic Neck:  No JVD.  Lungs: Clear bilaterally to auscultation and percussion. Heart: HRRR . Normal S1 and S2 without gallops or murmurs.  Abdomen: Bowel sounds are positive, abdomen soft and non-tender  Msk:  Back normal, normal gait. Normal strength and tone for age. Extremities: No clubbing, cyanosis or edema.   Neuro: Alert and oriented X 3.  Left-sided hemiparalysis blindness Psych:  Good affect, responds appropriately   LABS: Basic Metabolic Panel: Recent Labs    06/14/24 0405 06/15/24 0610  NA 131* 131*  K 4.2 4.0  CL 91* 91*  CO2 29 30  GLUCOSE 212* 186*  BUN 18 27*  CREATININE 0.86 0.79  CALCIUM  9.1 9.0   Liver Function Tests: No results for input(s): AST, ALT, ALKPHOS, BILITOT, PROT, ALBUMIN in the last 72 hours. No results for input(s): LIPASE, AMYLASE in the last 72 hours. CBC: Recent Labs    06/14/24 0405 06/15/24 0610  WBC 18.4* 15.9*  HGB 12.2 12.3  HCT 36.2 37.1  MCV 81.7 82.6  PLT 391 385   Cardiac Enzymes: No results for input(s): CKTOTAL, CKMB, CKMBINDEX, TROPONINI in the last 72 hours. BNP: Invalid input(s):  POCBNP D-Dimer: No results for input(s): DDIMER in the last 72 hours. Hemoglobin A1C: Recent Labs    06/12/24 1821  HGBA1C 6.1*   Fasting Lipid Panel: No results for input(s): CHOL, HDL, LDLCALC, TRIG, CHOLHDL, LDLDIRECT in the last 72 hours. Thyroid Function Tests: No results for input(s): TSH, T4TOTAL, T3FREE, THYROIDAB in the last 72 hours.  Invalid input(s): FREET3 Anemia Panel: No results for input(s): VITAMINB12, FOLATE, FERRITIN, TIBC, IRON, RETICCTPCT in the last 72 hours.  No results found.   Echo severely reduced left ventricular function EF of 20 to 25%  TELEMETRY: Normal sinus rhythm rate of 75 low voltage nonspecific ST-T wave changes  ASSESSMENT AND PLAN:  Principal Problem:   Acute respiratory failure with hypoxia (HCC) Diabetes type 2 Elevated troponins Hyperlipidemia GERD History of CVA Coronary artery disease Obesity Hypertension Blindness  Plan Continue diuretic therapy for heart failure volume congestion Agree with diabetes management and control with sliding scale insulin  A1c goal less than 7 Elevated troponins consistent with myocardial injury non-MI with underlying coronary disease Hyperlipidemia continue statin therapy for lipid management Maintain PPI therapy for GERD symptoms History of chronic urinary retention with a suprapubic catheter in place History of CVA with left sided hemiplegia blindness continue current therapy History of coronary disease with angina on losartan  Ranexa Imdur statin therapy Hypertension continue  losartan  therapy for hypertension management and control History of obesity recommend modest weight loss patient has limited ability to exercise    Victoria JONETTA Lovelace, MD, 06/15/2024 9:59 AM

## 2024-06-15 NOTE — Progress Notes (Signed)
 PROGRESS NOTE    Victoria Medina  FMW:[id number] DOB: 11/12/1960 DOA: 06/12/2024 PCP: [name], DO   Assessment & Plan:   Principal Problem:   Acute respiratory failure with hypoxia (HCC)  Assessment and Plan: Acute hypoxic respiratory failure: likely secondary to fluid overload. Continue on supplemental oxygen. Weaned off of supplemental oxygen. Resolved  Acute on chronic systolic CHF: continue on IV lasix and losartan . Monitor I/Os. Cardio following and recs apprec   DM2: likely poorly controlled. Continue on SSI w/ accuchecks   Elevated troponins: secondary to demand ischemia as per cardio   HLD: continue on statin   Hypothyroidism: continue on home dose of levothyroxine    GERD: continue on PPI   Chronic urinary retention: w/ chronic suprapubic catheter.   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 Continue on statin, aspirin    Hx of CAD: continue on home dose of losartan , ranexa, imdur, statin   HTN: continue on imdur, losartan    Obesity: BMI is 35.11. Would benefit from weight loss    DVT prophylaxis: lovenox  Code Status: DNR Family Communication: Disposition Plan: d/c back to [facility]  Level of care: Progressive Consultants:  Cardio   Procedures:   Antimicrobials:   Subjective: Pt c/o fatigue   Objective: Vitals:   06/14/24 2359 06/15/24 0359 06/15/24 0805 06/15/24 0841  BP: 124/88 (!) 133/92  119/82  Pulse: 92 81 83 87  Resp: 17 17 16 17   Temp: 98.5 F (36.9 C) 98.5 F (36.9 C)  99 F (37.2 C)  TempSrc:    Oral  SpO2: 94% 97% 91% 93%  Weight:      Height:        Intake/Output Summary (Last 24 hours) at 06/15/2024 0907 Last data filed at 06/15/2024 0500 Gross per 24 hour  Intake 0 ml  Output 500 ml  Net -500 ml   Filed Weights   06/12/24 0159  Weight: 82 kg    Examination:  General exam: appears calm & comfortable Respiratory system: decreased breath sounds b/l Cardiovascular system: S1/S2+. No rubs or  clicks Gastrointestinal system: abd is soft, NT, obese & hypoactive bowel sounds Central nervous system: alert & awake Psychiatry: Judgement and insight appears at baseline. Flat mood and affect     Data Reviewed: I have personally reviewed following labs and imaging studies  CBC: Recent Labs  Lab 06/12/24 0150 06/13/24 0522 06/14/24 0405 06/15/24 0610  WBC 15.6* 15.5* 18.4* 15.9*  NEUTROABS 13.1*  --   --   --   HGB 11.6* 11.2* 12.2 12.3  HCT 34.7* 33.5* 36.2 37.1  MCV 83.2 83.1 81.7 82.6  PLT 323 375 391 385   Basic Metabolic Panel: Recent Labs  Lab 06/12/24 0812 06/12/24 1821 06/13/24 0522 06/14/24 0405 06/15/24 0610  NA 129* 131* 132* 131* 131*  K 3.5 3.8 4.4 4.2 4.0  CL 91* 92* 94* 91* 91*  CO2 27 27 28 29 30   GLUCOSE 348* 171* 171* 212* 186*  BUN 6* 6* 8 18 27*  CREATININE 0.84 0.77 0.73 0.86 0.79  CALCIUM  7.3* 9.2 8.8* 9.1 9.0   GFR: Estimated Creatinine Clearance: 69.2 mL/min (by C-G formula based on SCr of 0.79 mg/dL). Liver Function Tests: Recent Labs  Lab 06/12/24 0300  AST 13*  ALT 5  ALKPHOS 75  BILITOT 0.3  PROT 5.6*  ALBUMIN 3.4*   No results for input(s): LIPASE, AMYLASE in the last 168 hours. No results for input(s): AMMONIA in the last 168 hours. Coagulation Profile: No results  for input(s): INR, PROTIME in the last 168 hours. Cardiac Enzymes: No results for input(s): CKTOTAL, CKMB, CKMBINDEX, TROPONINI in the last 168 hours. BNP (last 3 results) Recent Labs    06/12/24 0300  PROBNP 1,956.0*   HbA1C: Recent Labs    06/12/24 1821  HGBA1C 6.1*   CBG: Recent Labs  Lab 06/14/24 0810 06/14/24 1228 06/14/24 1650 06/14/24 2124 06/15/24 0849  GLUCAP 139* 116* 242* 274* 173*   Lipid Profile: No results for input(s): CHOL, HDL, LDLCALC, TRIG, CHOLHDL, LDLDIRECT in the last 72 hours. Thyroid Function Tests: No results for input(s): TSH, T4TOTAL, FREET4, T3FREE, THYROIDAB in the last 72  hours. Anemia Panel: No results for input(s): VITAMINB12, FOLATE, FERRITIN, TIBC, IRON, RETICCTPCT in the last 72 hours. Sepsis Labs: Recent Labs  Lab 06/14/24 1104  PROCALCITON <0.10     Recent Results (from the past 240 hours)  Resp panel by RT-PCR (RSV, Flu A&B, Covid) Anterior Nasal Swab     Status: None   Collection Time: 06/12/24  1:36 AM   Specimen: Anterior Nasal Swab  Result Value Ref Range Status   SARS Coronavirus 2 by RT PCR NEGATIVE NEGATIVE Final    Comment: (NOTE) SARS-CoV-2 target nucleic acids are NOT DETECTED.  The SARS-CoV-2 RNA is generally detectable in upper respiratory specimens during the acute phase of infection. The lowest concentration of SARS-CoV-2 viral copies this assay can detect is 138 copies/mL. A negative result does not preclude SARS-Cov-2 infection and should not be used as the sole basis for treatment or other patient management decisions. A negative result may occur with  improper specimen collection/handling, submission of specimen other than nasopharyngeal swab, presence of viral mutation(s) within the areas targeted by this assay, and inadequate number of viral copies(<138 copies/mL). A negative result must be combined with clinical observations, patient history, and epidemiological information. The expected result is Negative.  Fact Sheet for Patients:  bloggercourse.com  Fact Sheet for Healthcare Providers:  seriousbroker.it  This test is no t yet approved or cleared by the [place]  FDA and  has been authorized for detection and/or diagnosis of SARS-CoV-2 by FDA under an Emergency Use Authorization (EUA). This EUA will remain  in effect (meaning this test can be used) for the duration of the COVID-19 declaration under Section 564(b)(1) of the Act, 21 U.S.C.section 360bbb-3(b)(1), unless the authorization is terminated  or revoked sooner.       Influenza A by  PCR NEGATIVE NEGATIVE Final   Influenza B by PCR NEGATIVE NEGATIVE Final    Comment: (NOTE) The Xpert Xpress SARS-CoV-2/FLU/RSV plus assay is intended as an aid in the diagnosis of influenza from Nasopharyngeal swab specimens and should not be used as a sole basis for treatment. Nasal washings and aspirates are unacceptable for Xpert Xpress SARS-CoV-2/FLU/RSV testing.  Fact Sheet for Patients: bloggercourse.com  Fact Sheet for Healthcare Providers: seriousbroker.it  This test is not yet approved or cleared by the [place]  FDA and has been authorized for detection and/or diagnosis of SARS-CoV-2 by FDA under an Emergency Use Authorization (EUA). This EUA will remain in effect (meaning this test can be used) for the duration of the COVID-19 declaration under Section 564(b)(1) of the Act, 21 U.S.C. section 360bbb-3(b)(1), unless the authorization is terminated or revoked.     Resp Syncytial Virus by PCR NEGATIVE NEGATIVE Final    Comment: (NOTE) Fact Sheet for Patients: bloggercourse.com  Fact Sheet for Healthcare Providers: seriousbroker.it  This test is not yet approved or cleared by the [place] FDA and has been authorized for detection and/or diagnosis of SARS-CoV-2 by FDA under an Emergency Use Authorization (EUA). This EUA will remain in effect (meaning this test can be used) for the duration of the COVID-19 declaration under Section 564(b)(1) of the Act, 21 U.S.C. section 360bbb-3(b)(1), unless the authorization is terminated or revoked.  Performed at [facility], [address] [place], [place] [address]          Radiology Studies: No results found.       Scheduled Meds:  aspirin  EC  81 mg Oral Daily   enoxaparin  (LOVENOX ) injection  40 mg Subcutaneous Q24H   fluticasone   1 spray Each Nare BID   furosemide  40 mg Intravenous Q12H    insulin  aspart  0-15 Units Subcutaneous TID WC   insulin  aspart  0-5 Units Subcutaneous QHS   isosorbide mononitrate  60 mg Oral Daily   levothyroxine   25 mcg Oral QAC breakfast   losartan   100 mg Oral Daily   methylPREDNISolone  (SOLU-MEDROL ) injection  80 mg Intravenous Q24H   mirabegron  ER  25 mg Oral Daily   OLANZapine   5 mg Oral Daily   pantoprazole   20 mg Oral Daily   polyethylene glycol  17 g Oral Daily   ranolazine  500 mg Oral BID   rosuvastatin  5 mg Oral Once per day on Tuesday Thursday Saturday   senna-docusate  2 tablet Oral QHS   Continuous Infusions:   LOS: 3 days      Anthony CHRISTELLA Pouch, MD [facility] Pager [phone]  If 7PM-7AM, please contact night-coverage [email or web address] 06/15/2024, 9:07 AM

## 2024-06-16 DIAGNOSIS — I5023 Acute on chronic systolic (congestive) heart failure: Secondary | ICD-10-CM | POA: Diagnosis not present

## 2024-06-16 LAB — BASIC METABOLIC PANEL WITH GFR
Anion gap: 12 (ref 5–15)
BUN: 25 mg/dL — ABNORMAL HIGH (ref 8–23)
CO2: 34 mmol/L — ABNORMAL HIGH (ref 22–32)
Calcium: 9.2 mg/dL (ref 8.9–10.3)
Chloride: 89 mmol/L — ABNORMAL LOW (ref 98–111)
Creatinine, Ser: 0.96 mg/dL (ref 0.44–1.00)
GFR, Estimated: >60 mL/min (ref >60)
Glucose, Bld: 181 mg/dL — ABNORMAL HIGH (ref 70–99)
Potassium: 3.3 mmol/L — ABNORMAL LOW (ref 3.5–5.1)
Sodium: 135 mmol/L (ref 135–145)

## 2024-06-16 LAB — CBC
HCT: 43.1 % (ref 36.0–46.0)
Hemoglobin: 14.2 g/dL (ref 12.0–15.0)
MCH: 27.5 pg (ref 26.0–34.0)
MCHC: 32.9 g/dL (ref 30.0–36.0)
MCV: 83.5 fL (ref 80.0–100.0)
Platelets: 446 K/uL — ABNORMAL HIGH (ref 150–400)
RBC: 5.16 MIL/uL — ABNORMAL HIGH (ref 3.87–5.11)
RDW: 13.6 % (ref 11.5–15.5)
WBC: 14.7 K/uL — ABNORMAL HIGH (ref 4.0–10.5)
nRBC: 0 % (ref 0.0–0.2)

## 2024-06-16 LAB — GLUCOSE, CAPILLARY
Glucose-Capillary: 146 mg/dL — ABNORMAL HIGH (ref 70–99)
Glucose-Capillary: 172 mg/dL — ABNORMAL HIGH (ref 70–99)

## 2024-06-16 MED ORDER — FUROSEMIDE 40 MG PO TABS: 40.0000 mg | ORAL_TABLET | Freq: Every day | ORAL | 0 refills | Status: DC

## 2024-06-16 MED ORDER — PREDNISONE 20 MG PO TABS: 40.0000 mg | ORAL_TABLET | Freq: Every day | ORAL | 0 refills | Status: AC

## 2024-06-16 MED ORDER — POTASSIUM CHLORIDE CRYS ER 20 MEQ PO TBCR
40.0000 meq | EXTENDED_RELEASE_TABLET | Freq: Once | ORAL | Status: AC
  Administered 2024-06-16: 40 meq via ORAL
  Filled 2024-06-16: qty 2

## 2024-06-16 MED ORDER — SPIRONOLACTONE 12.5 MG HALF TABLET
12.5000 mg | ORAL_TABLET | Freq: Every day | ORAL | Status: DC
  Filled 2024-06-16 (×2): qty 1

## 2024-06-16 MED ORDER — FUROSEMIDE 40 MG PO TABS
40.0000 mg | ORAL_TABLET | Freq: Every day | ORAL | Status: DC
  Administered 2024-06-16: 40 mg via ORAL
  Filled 2024-06-16: qty 1

## 2024-06-16 MED ORDER — DIAZEPAM 10 MG PO TABS
10.0000 mg | ORAL_TABLET | Freq: Four times a day (QID) | ORAL | 0 refills | Status: DC
Start: 2024-06-16 — End: 2024-06-17

## 2024-06-16 MED ORDER — SPIRONOLACTONE 25 MG PO TABS: 12.5000 mg | ORAL_TABLET | Freq: Every day | ORAL | 0 refills | Status: DC

## 2024-06-16 NOTE — Progress Notes (Signed)
 [facility] CLINIC CARDIOLOGY PROGRESS NOTE       Patient ID: Victoria Medina MRN: [id number] DOB/AGE: 1961/02/28 63 y.o.  Admit date: 06/12/2024 Referring Physician Dr. Katha Gail Primary Physician [name], DO  Primary Cardiologist Dr. Wilburn Reason for Consultation AoCHFrEF  HPI: Victoria Medina is a 63 y.o. female  with a past medical history of chronic HFrEF, coronary artery disease s/p multiple stents, hypertension, history of stroke with flaccid hemiaplasia, hypothyroidism, morbid obesity, blindness, chronic suprapubic catheter who presented to the ED on 06/12/2024 for respiratory distress.  Suspect heart failure exacerbation.  Cardiology was consulted for further evaluation.   Interval history: -Patient seen and examined this AM, sleeping peacefully in bed laying flat. -States she is feeling much better today. Denies SOB or CP.  -Weaned to room air.  Net -9.5 L during admission.  Review of systems complete and found to be negative unless listed above    Past Medical History:  Diagnosis Date   Acute ST elevation myocardial infarction (STEMI) of inferolateral wall (HCC) 01/25/2020   Blind    Cholecystitis 04/22/2021   History of migraine headaches    Hyperlipemia    Myocardial infarction [facility])    Stroke [facility])     Past Surgical History:  Procedure Laterality Date   ABDOMINAL HYSTERECTOMY     BACK SURGERY     CARDIAC CATHETERIZATION     CORONARY/GRAFT ACUTE MI REVASCULARIZATION N/A 01/25/2020   Procedure: Coronary/Graft Acute MI Revascularization;  Surgeon: [name], MD;  Location: [facility] INVASIVE CV LAB;  Service: Cardiovascular;  Laterality: N/A;   FRACTURE SURGERY     IR CATHETER TUBE CHANGE  08/10/2021   LEFT HEART CATH AND CORONARY ANGIOGRAPHY N/A 01/25/2020   Procedure: LEFT HEART CATH AND CORONARY ANGIOGRAPHY;  Surgeon: [name], MD;  Location: [facility] INVASIVE CV LAB;  Service: Cardiovascular;  Laterality: N/A;   SKIN GRAFT Right    TOTAL ABDOMINAL  HYSTERECTOMY Bilateral     Medications Prior to Admission  Medication Sig Dispense Refill Last Dose/Taking   amLODipine (NORVASC) 2.5 MG tablet Take 2.5 mg by mouth daily.   06/11/2024   aspirin  EC 81 MG tablet Take 81 mg by mouth daily.   06/11/2024   Cholecalciferol (VITAMIN D3) 1.25 MG (50000 UT) CAPS Take 1 capsule by mouth once a week.  every Mon for Supplement   Past Week   Dextromethorphan-Benzocaine (COUGH/SORE THROAT LOZENGES MT) Use as directed 1 drop in the mouth or throat every 4 (four) hours as needed.   Unknown   diazepam  (VALIUM ) 10 MG tablet TAKE 1 TABLET BY MOUTH FOUR TIMES A DAY 120 tablet 0 06/11/2024   fluticasone  (FLONASE ) 50 MCG/ACT nasal spray Place 1 spray into both nostrils 2 (two) times daily.   06/11/2024   ibuprofen  (ADVIL ) 800 MG tablet Take 800 mg by mouth 2 (two) times daily as needed for mild pain (pain score 1-3) or moderate pain (pain score 4-6).   Unknown   isosorbide mononitrate (IMDUR) 60 MG 24 hr tablet Take 60 mg by mouth daily.   06/11/2024   lansoprazole (PREVACID) 15 MG capsule Take 15 mg by mouth 2 (two) times daily before a meal.   06/11/2024   levothyroxine  (SYNTHROID ) 25 MCG tablet Take 25 mcg by mouth daily before breakfast.   06/11/2024   losartan  (COZAAR ) 100 MG tablet Take 100 mg by mouth daily.   06/11/2024   magnesium  hydroxide (MILK OF MAGNESIA) 400 MG/5ML suspension Take 5 mLs by mouth every 6 (six) hours  as needed for mild constipation.   Unknown   nitroGLYCERIN  (NITROSTAT ) 0.4 MG SL tablet Place 0.4 mg under the tongue every 5 (five) minutes x 3 doses as needed for chest pain.    Unknown   OLANZapine  (ZYPREXA ) 5 MG tablet Take 5 mg by mouth daily.   06/11/2024   ondansetron  (ZOFRAN -ODT) 4 MG disintegrating tablet Take 4 mg by mouth every 4 (four) hours as needed for nausea or vomiting.   Unknown   polyethylene glycol (MIRALAX  / GLYCOLAX ) 17 g packet Take 17 g by mouth daily.   06/11/2024   ranolazine (RANEXA) 500 MG 12 hr tablet Take 500 mg  by mouth 2 (two) times daily.   06/11/2024   rosuvastatin (CRESTOR) 5 MG tablet Take 5 mg by mouth daily. Every Tuesday,Thursday,Sat   Past Week   senna-docusate (SENOKOT-S) 8.6-50 MG tablet Take 2 tablets by mouth at bedtime.   06/11/2024   Vibegron  (GEMTESA ) 75 MG TABS Take 75 mg by mouth daily. 30 tablet 11 06/11/2024   nystatin  powder Apply 1 Application topically 3 (three) times daily. (Patient not taking: Reported on 06/12/2024)   Not Taking   promethazine  (PHENERGAN ) 12.5 MG suppository Place 1 suppository (12.5 mg total) rectally every 6 (six) hours as needed for nausea or vomiting. (Patient not taking: Reported on 06/12/2024) 12 each 0 Not Taking   Social History   Socioeconomic History   Marital status: Single    Spouse name: Not on file   Number of children: Not on file   Years of education: Not on file   Highest education level: Not on file  Occupational History   Not on file  Tobacco Use   Smoking status: Never   Smokeless tobacco: Never  Vaping Use   Vaping status: Never Used  Substance and Sexual Activity   Alcohol use: Yes    Comment: occasional   Drug use: No   Sexual activity: Not Currently  Other Topics Concern   Not on file  Social History Narrative   Not on file   Social Drivers of Health   Financial Resource Strain: Not on file  Food Insecurity: No Food Insecurity (06/13/2024)   Hunger Vital Sign    Worried About Running Out of Food in the Last Year: Never true    Ran Out of Food in the Last Year: Never true  Transportation Needs: No Transportation Needs (06/13/2024)   PRAPARE - Administrator, Civil Service (Medical): No    Lack of Transportation (Non-Medical): No  Physical Activity: Not on file  Stress: Not on file  Social Connections: Not on file  Intimate Partner Violence: Not At Risk (06/13/2024)   Humiliation, Afraid, Rape, and Kick questionnaire    Fear of Current or Ex-Partner: No    Emotionally Abused: No    Physically  Abused: No    Sexually Abused: No    Family History  Problem Relation Age of Onset   Heart attack Mother    Hypertension Mother    Hypercholesterolemia Mother    Diabetes Mother    Stroke Father    Heart attack Father    Hypertension Father    Hypercholesterolemia Father    Diabetes Father    Diabetes Maternal Grandmother    Cancer - Other Maternal Grandmother      Vitals:   06/15/24 2013 06/15/24 2314 06/16/24 0510 06/16/24 0805  BP: 104/72 100/67 105/61 109/74  Pulse: 87 80 81 80  Resp:  17 17 16   Temp:  97.8 F (36.6 C) 97.9 F (36.6 C) 97.8 F (36.6 C) 98.2 F (36.8 C)  TempSrc: Oral Oral Oral   SpO2: 96% 94% 93% 93%  Weight:      Height:        PHYSICAL EXAM General: Chronically ill appearing female, well nourished, in no acute distress. HEENT: Normocephalic and atraumatic. Neck: No JVD.  Lungs: Normal respiratory effort on room air. Crackles bilaterally. Heart: HRRR. Normal S1 and S2 without gallops or murmurs.  Abdomen: Non-distended appearing.  Msk: Normal strength and tone for age. Extremities: Warm and well perfused. No clubbing, cyanosis. Trace edema.  Neuro: Alert and oriented X 3. Psych: Answers questions appropriately.   Labs: Basic Metabolic Panel: Recent Labs    06/15/24 0610 06/16/24 0259  NA 131* 135  K 4.0 3.3*  CL 91* 89*  CO2 30 34*  GLUCOSE 186* 181*  BUN 27* 25*  CREATININE 0.79 0.96  CALCIUM  9.0 9.2   Liver Function Tests: No results for input(s): AST, ALT, ALKPHOS, BILITOT, PROT, ALBUMIN in the last 72 hours.  No results for input(s): LIPASE, AMYLASE in the last 72 hours. CBC: Recent Labs    06/15/24 0610 06/16/24 0259  WBC 15.9* 14.7*  HGB 12.3 14.2  HCT 37.1 43.1  MCV 82.6 83.5  PLT 385 446*   Cardiac Enzymes: No results for input(s): CKTOTAL, CKMB, CKMBINDEX, TROPONINIHS in the last 72 hours. BNP: No results for input(s): BNP in the last 72 hours. D-Dimer: No results for input(s):  DDIMER in the last 72 hours. Hemoglobin A1C: No results for input(s): HGBA1C in the last 72 hours.  Fasting Lipid Panel: No results for input(s): CHOL, HDL, LDLCALC, TRIG, CHOLHDL, LDLDIRECT in the last 72 hours. Thyroid Function Tests: No results for input(s): TSH, T4TOTAL, T3FREE, THYROIDAB in the last 72 hours.  Invalid input(s): FREET3 Anemia Panel: No results for input(s): VITAMINB12, FOLATE, FERRITIN, TIBC, IRON, RETICCTPCT in the last 72 hours.   Radiology: [facility] Chest Port 1 View Result Date: 06/12/2024 CLINICAL DATA:  Cough EXAM: PORTABLE CHEST 1 VIEW COMPARISON:  06/05/2024 FINDINGS: Cardiac shadow is stable. Aortic calcifications are noted. Diffuse interstitial changes are seen consistent with edema. No effusion is seen. No focal infiltrate is noted. Postsurgical changes in the cervical spine are seen. IMPRESSION: Interstitial edema. Electronically Signed   By: Oneil Devonshire M.D.   On: 06/12/2024 02:06   ECHOCARDIOGRAM COMPLETE Result Date: 06/06/2024    ECHOCARDIOGRAM REPORT   Patient Name:   Victoria Medina Date of Exam: 06/06/2024 Medical Rec #:  [id number]    Height:       60.0 in Accession #:    [id number]   Weight:       181.5 lb Date of Birth:  1961/07/15   BSA:          1.791 m Patient Age:    62 years     BP:           128/77 mmHg Patient Gender: F            HR:           96 bpm. Exam Location:  [facility] Procedure: 2D Echo, Cardiac Doppler and Color Doppler (Both Spectral and Color            Flow Doppler were utilized during procedure). Indications:     CHF-acute diastolic I50.31  History:         Patient has prior history of Echocardiogram examinations, most  recent 01/26/2020. Previous Myocardial Infarction, Stroke; Risk                  Factors:Dyslipidemia.  Sonographer:     Christopher Furnace Referring Phys:  [id number] [facility] Diagnosing Phys: Cara JONETTA Lovelace MD IMPRESSIONS  1. Left ventricular ejection fraction, by estimation, is 20  to 25%. The left ventricle has severely decreased function. The left ventricle demonstrates global hypokinesis. The left ventricular internal cavity size was moderately to severely dilated. There is moderate asymmetric left ventricular hypertrophy of the inferior and basal segments. Left ventricular diastolic parameters are consistent with Grade III diastolic dysfunction (restrictive).  2. Right ventricular systolic function is mildly reduced. The right ventricular size is mildly enlarged.  3. The mitral valve is grossly normal. Mild mitral valve regurgitation.  4. The aortic valve is normal in structure. Aortic valve regurgitation is not visualized. FINDINGS  Left Ventricle: Left ventricular ejection fraction, by estimation, is 20 to 25%. The left ventricle has severely decreased function. The left ventricle demonstrates global hypokinesis. Strain was performed and the global longitudinal strain is indeterminate. The left ventricular internal cavity size was moderately to severely dilated. There is moderate asymmetric left ventricular hypertrophy of the inferior and basal segments. Left ventricular diastolic parameters are consistent with Grade III  diastolic dysfunction (restrictive). Right Ventricle: The right ventricular size is mildly enlarged. No increase in right ventricular wall thickness. Right ventricular systolic function is mildly reduced. Left Atrium: Left atrial size was normal in size. Right Atrium: Right atrial size was normal in size. Pericardium: There is no evidence of pericardial effusion. Mitral Valve: The mitral valve is grossly normal. Mild mitral valve regurgitation. Tricuspid Valve: The tricuspid valve is normal in structure. Tricuspid valve regurgitation is mild. Aortic Valve: The aortic valve is normal in structure. Aortic valve regurgitation is not visualized. Aortic valve mean gradient measures 2.0 mmHg. Aortic valve peak gradient measures 3.7 mmHg. Aortic valve area, by VTI measures  2.44 cm. Pulmonic Valve: The pulmonic valve was normal in structure. Pulmonic valve regurgitation is not visualized. Aorta: The ascending aorta was not well visualized. IAS/Shunts: No atrial level shunt detected by color flow Doppler. Additional Comments: 3D was performed not requiring image post processing on an independent workstation and was indeterminate.  LEFT VENTRICLE PLAX 2D LVIDd:         5.54 cm      Diastology LVIDs:         2.69 cm      LV e' medial:    3.04 cm/s LV PW:         1.49 cm      LV E/e' medial:  29.8 LV IVS:        0.84 cm      LV e' lateral:   6.67 cm/s LVOT diam:     2.00 cm      LV E/e' lateral: 13.6 LV SV:         47 LV SV Index:   26 LVOT Area:     3.14 cm LV IVRT:       131 msec  LV Volumes (MOD) LV vol d, MOD A2C: 111.0 ml LV vol d, MOD A4C: 132.0 ml LV vol s, MOD A2C: 102.0 ml LV vol s, MOD A4C: 110.0 ml LV SV MOD A2C:     9.0 ml LV SV MOD A4C:     132.0 ml LV SV MOD BP:      15.3 ml RIGHT VENTRICLE RV Basal diam:  2.82 cm RV Mid diam:    2.55 cm RV S prime:     11.90 cm/s TAPSE (M-mode): 1.3 cm LEFT ATRIUM             Index        RIGHT ATRIUM           Index LA diam:        3.50 cm 1.95 cm/m   RA Area:     12.20 cm LA Vol (A2C):   26.5 ml 14.79 ml/m  RA Volume:   26.50 ml  14.79 ml/m LA Vol (A4C):   69.8 ml 38.94 ml/m LA Biplane Vol: 24.5 ml 13.68 ml/m  AORTIC VALVE AV Area (Vmax):    2.59 cm AV Area (Vmean):   2.38 cm AV Area (VTI):     2.44 cm AV Vmax:           96.75 cm/s AV Vmean:          67.650 cm/s AV VTI:            0.192 m AV Peak Grad:      3.7 mmHg AV Mean Grad:      2.0 mmHg LVOT Vmax:         79.80 cm/s LVOT Vmean:        51.200 cm/s LVOT VTI:          0.149 m LVOT/AV VTI ratio: 0.78  AORTA Ao Root diam: 2.90 cm MITRAL VALVE               TRICUSPID VALVE MV Area (PHT): 6.07 cm    TR Peak grad:   8.9 mmHg MV Decel Time: 125 msec    TR Vmax:        149.00 cm/s MV E velocity: 90.70 cm/s MV A velocity: 66.50 cm/s  SHUNTS MV E/A ratio:  1.36        Systemic VTI:   0.15 m                            Systemic Diam: 2.00 cm Cara JONETTA Lovelace MD Electronically signed by Cara JONETTA Lovelace MD Signature Date/Time: 06/06/2024/5:02:03 PM    Final    DG Chest Portable 1 View Result Date: 06/05/2024 EXAM: 1 VIEW(S) XRAY OF THE CHEST 06/05/2024 10:57:00 AM COMPARISON: 10/04/2023 CLINICAL HISTORY: aspirated food, feeling SOB. wheezing on exam FINDINGS: LUNGS AND PLEURA: New diffuse interstitial and patchy lung opacities. Relative sparing of the left upper lobe. Trace right pleural effusion. No pneumothorax. HEART AND MEDIASTINUM: Cardiomegaly. Aortic atherosclerotic calcification. BONES AND SOFT TISSUES: No acute osseous abnormality. IMPRESSION: 1. New diffuse interstitial airspace opacities relatively diffusely. Favor multifocal infection or aspiration over pulmonary edema. 2. Trace right pleural effusion. 3. Aortic atherosclerosis (icd10-i70.0). Electronically signed by: Rockey Kilts MD 06/05/2024 11:54 AM EST RP Workstation: [id number]    ECHO 06/06/2024: 1. Left ventricular ejection fraction, by estimation, is 20 to 25%. The left ventricle has severely decreased function. The left ventricle demonstrates global hypokinesis. The left ventricular internal cavity size was moderately to severely dilated. There is moderate asymmetric left ventricular hypertrophy of the inferior and basal segments. Left ventricular diastolic parameters are consistent with Grade III diastolic dysfunction (restrictive).   2. Right ventricular systolic function is mildly reduced. The right  ventricular size is mildly enlarged.   3. The mitral valve is grossly normal. Mild mitral valve regurgitation.   4. The aortic valve is normal in structure.  Aortic valve regurgitation is  not visualized.   TELEMETRY (personally reviewed): NSR rate 70s  EKG (personally reviewed): Normal sinus rhythm rate is 75 bpm, no acute ischemic changes  Data reviewed by me 06/16/2024: last 24h vitals tele labs imaging I/O  ED provider note, admission H&P, hospitalist progress note  Principal Problem:   Acute respiratory failure with hypoxia (HCC)    ASSESSMENT AND PLAN:  Victoria Medina is a 63 y.o. female  with a past medical history of chronic HFrEF, coronary artery disease s/p multiple stents, hypertension, history of stroke with flaccid hemiaplasia, hypothyroidism, morbid obesity, blindness, chronic suprapubic catheter who presented to the ED on 06/12/2024 for respiratory distress.  Suspect heart failure exacerbation.  Cardiology was consulted for further evaluation.   # Acute on chronic HFrEF # Demand ischemia # Coronary artery disease # Hx stroke, hemiplasia Patient recently admitted for PNA now presenting with SOB and chest tightness worse with talking. BNP elevated at 1956. Troponins 61 > 93. EKG without acute ischemic changes. Started on IV diuresis. Echo last admission with EF 20-25%, global hypokinesis.  -Transition to p.o. Lasix 40 mg daily. -Start spironolactone 12.5 mg daily for GDMT.  Continue home losartan  100 mg daily, Ranexa 500 mg twice daily, Imdur 60 mg daily.  -Continue Crestor 5 mg daily, aspirin  81 mg daily. -Suspect troponin elevation most consistent with demand/supply mismatch and not ACS in the setting of acute heart failure. Will check one more troponin.  Ok for discharge today from a cardiac perspective. Will arrange for follow up in clinic with Dr. Wilburn in 1-2 weeks.    This patient's plan of care was discussed and created with Dr. Florencio and he is in agreement.  Signed: Danita Bloch, PA-C  06/16/2024, 9:52 AM [facility] Cardiology

## 2024-06-16 NOTE — Progress Notes (Signed)
 Removed telemetry and PIV.  [facility] to transport patient back to facility.

## 2024-06-16 NOTE — Discharge Summary (Signed)
 Physician Discharge Summary  Victoria Medina FMW:[id number] DOB: Oct 14, 1960 DOA: 06/12/2024  PCP: [name], DO  Admit date: 06/12/2024 Discharge date: 06/16/2024  Admitted From: home Disposition:  home   Recommendations for Outpatient Follow-up:  Follow up with PCP in 1-2 weeks F/u w/ cardio, Dr. Wilburn, in 1 week    Home Health: no  Equipment/Devices:  Discharge Condition: stable  CODE STATUS: DNR Diet recommendation: Heart Healthy / Carb Modified   Brief/Interim Summary: HPI was taken from Dr. Zella: Victoria Medina is a 63 y.o. female with medical history significant for bilateral blindness, CAD, stroke, anxiety/depression, hypothyroidism, morbid obesity, heart failure with reduced EF, chronic suprapubic catheter recently discharged after being hospitalized secondary to acute hypoxic respiratory failure due to aspiration pneumonia and mild CHF exacerbation who is now being admitted to the hospital with recurrent acute hypoxic respiratory failure due to acute on chronic heart failure with reduced EF.  Patient was discharged on 11/9 back to [facility] on p.o. antibiotics.  Tells me that after return to her facility, overall she was doing well but she feels like she gradually developed some shortness of breath.  She denies any significant cough, sputum production, fevers, chills.  She says that she had some central chest pain overnight, but this has resolved.  She was brought to the emergency department via EMS earlier this morning in respiratory distress.  Per facility staff, patient had increasing shortness of breath over the last 1 to 2 days, with shortness of breath and wheezing, she was noted to have an initial room air saturation of 78%.  She was given 1 DuoNeb and placed on a nonrebreather mask by EMS, saturating 97% on arrival.  In the emergency department, she was placed on BiPAP, given breathing treatments as well as 80 mg IV Lasix.   Discharge Diagnoses:  Principal Problem:    Acute respiratory failure with hypoxia (HCC)  Acute hypoxic respiratory failure: likely secondary to fluid overload. Continue on supplemental oxygen. Weaned off of supplemental oxygen. Resolved  Acute on chronic systolic CHF: continue on lasix and losartan . Started on aldactone as per cardio Monitor I/Os. Cardio following and recs apprec   DM2: well controlled currently, HbA1c 6.. Diet controlled   Elevated troponins: secondary to demand ischemia as per cardio   HLD: continue on statin   Hypothyroidism: continue on home dose of levothyroxine    GERD: continue on PPI   Chronic urinary retention: w/ chronic suprapubic catheter.   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 Continue on statin, aspirin    Hx of CAD: continue on home dose of losartan , ranexa, imdur, statin. Started on aldactone as per cardio   HTN: continue on imdur, losartan . Started on aldactone as per cardio   Obesity: BMI is 35.11. Would benefit from weight loss   Discharge Instructions  Discharge Instructions     Diet - low sodium heart healthy   Complete by: As directed    Diet Carb Modified   Complete by: As directed    Discharge instructions   Complete by: As directed    F/u w/ PCP in 1-2 weeks   Increase activity slowly   Complete by: As directed       Allergies as of 06/16/2024       Reactions   Levaquin [levofloxacin In D5w] Anaphylaxis, Swelling   Iodinated Contrast Media Itching   Severe itching   Other    Dust mites and opiates (all of them)   Latex Dermatitis  Medication List     STOP taking these medications    amLODipine 2.5 MG tablet Commonly known as: NORVASC   nystatin  powder       TAKE these medications    aspirin  EC 81 MG tablet Take 81 mg by mouth daily.   COUGH/SORE THROAT LOZENGES MT Use as directed 1 drop in the mouth or throat every 4 (four) hours as needed.   diazepam  10 MG tablet Commonly known as: VALIUM  Take 1 tablet (10 mg total) by  mouth 4 (four) times daily for 1 day.   fluticasone  50 MCG/ACT nasal spray Commonly known as: FLONASE  Place 1 spray into both nostrils 2 (two) times daily.   furosemide 40 MG tablet Commonly known as: LASIX Take 1 tablet (40 mg total) by mouth daily. Start taking on: June 17, 2024   Gemtesa  75 MG Tabs Generic drug: Vibegron  Take 75 mg by mouth daily.   ibuprofen  800 MG tablet Commonly known as: ADVIL  Take 800 mg by mouth 2 (two) times daily as needed for mild pain (pain score 1-3) or moderate pain (pain score 4-6).   isosorbide mononitrate 60 MG 24 hr tablet Commonly known as: IMDUR Take 60 mg by mouth daily.   lansoprazole 15 MG capsule Commonly known as: PREVACID Take 15 mg by mouth 2 (two) times daily before a meal.   levothyroxine  25 MCG tablet Commonly known as: SYNTHROID  Take 25 mcg by mouth daily before breakfast.   losartan  100 MG tablet Commonly known as: COZAAR  Take 100 mg by mouth daily.   magnesium  hydroxide 400 MG/5ML suspension Commonly known as: MILK OF MAGNESIA Take 5 mLs by mouth every 6 (six) hours as needed for mild constipation.   nitroGLYCERIN  0.4 MG SL tablet Commonly known as: NITROSTAT  Place 0.4 mg under the tongue every 5 (five) minutes x 3 doses as needed for chest pain.   OLANZapine  5 MG tablet Commonly known as: ZYPREXA  Take 5 mg by mouth daily.   ondansetron  4 MG disintegrating tablet Commonly known as: ZOFRAN -ODT Take 4 mg by mouth every 4 (four) hours as needed for nausea or vomiting.   polyethylene glycol 17 g packet Commonly known as: MIRALAX  / GLYCOLAX  Take 17 g by mouth daily.   predniSONE 20 MG tablet Commonly known as: DELTASONE Take 2 tablets (40 mg total) by mouth daily for 5 days.   promethazine  12.5 MG suppository Commonly known as: PHENERGAN  Place 1 suppository (12.5 mg total) rectally every 6 (six) hours as needed for nausea or vomiting.   ranolazine 500 MG 12 hr tablet Commonly known as: RANEXA Take 500  mg by mouth 2 (two) times daily.   rosuvastatin 5 MG tablet Commonly known as: CRESTOR Take 5 mg by mouth daily. Every Tuesday,Thursday,Sat   senna-docusate 8.6-50 MG tablet Commonly known as: Senokot-S Take 2 tablets by mouth at bedtime.   spironolactone 25 MG tablet Commonly known as: ALDACTONE Take 0.5 tablets (12.5 mg total) by mouth daily.   Vitamin D3 1.25 MG (50000 UT) Caps Take 1 capsule by mouth once a week.  every Mon for Supplement        Follow-up Information     Alluri, Keller BROCKS, MD. Go in 1 week(s).   Specialty: Cardiology Contact information: [address] [place] [address] [phone]         [name], DO Follow up.   Specialty: Internal Medicine Why: F/u in 1-2 weeks Contact information: [address] [place] [address] [phone]  Allergies  Allergen Reactions   Levaquin [Levofloxacin In D5w] Anaphylaxis and Swelling   Iodinated Contrast Media Itching    Severe itching   Other     Dust mites and opiates (all of them)   Latex Dermatitis    Consultations: Cardio    Procedures/Studies: DG Chest Port 1 View Result Date: 06/12/2024 CLINICAL DATA:  Cough EXAM: PORTABLE CHEST 1 VIEW COMPARISON:  06/05/2024 FINDINGS: Cardiac shadow is stable. Aortic calcifications are noted. Diffuse interstitial changes are seen consistent with edema. No effusion is seen. No focal infiltrate is noted. Postsurgical changes in the cervical spine are seen. IMPRESSION: Interstitial edema. Electronically Signed   By: Oneil Devonshire M.D.   On: 06/12/2024 02:06   ECHOCARDIOGRAM COMPLETE Result Date: 06/06/2024    ECHOCARDIOGRAM REPORT   Patient Name:   Victoria Medina Date of Exam: 06/06/2024 Medical Rec #:  [id number]    Height:       60.0 in Accession #:    [id number]   Weight:       181.5 lb Date of Birth:  August 02, 1960   BSA:          1.791 m Patient Age:    62 years     BP:           128/77 mmHg Patient Gender: F             HR:           96 bpm. Exam Location:  [facility] Procedure: 2D Echo, Cardiac Doppler and Color Doppler (Both Spectral and Color            Flow Doppler were utilized during procedure). Indications:     CHF-acute diastolic I50.31  History:         Patient has prior history of Echocardiogram examinations, most                  recent 01/26/2020. Previous Myocardial Infarction, Stroke; Risk                  Factors:Dyslipidemia.  Sonographer:     Christopher Furnace Referring Phys:  [id number] [facility] Diagnosing Phys: Cara JONETTA Lovelace MD IMPRESSIONS  1. Left ventricular ejection fraction, by estimation, is 20 to 25%. The left ventricle has severely decreased function. The left ventricle demonstrates global hypokinesis. The left ventricular internal cavity size was moderately to severely dilated. There is moderate asymmetric left ventricular hypertrophy of the inferior and basal segments. Left ventricular diastolic parameters are consistent with Grade III diastolic dysfunction (restrictive).  2. Right ventricular systolic function is mildly reduced. The right ventricular size is mildly enlarged.  3. The mitral valve is grossly normal. Mild mitral valve regurgitation.  4. The aortic valve is normal in structure. Aortic valve regurgitation is not visualized. FINDINGS  Left Ventricle: Left ventricular ejection fraction, by estimation, is 20 to 25%. The left ventricle has severely decreased function. The left ventricle demonstrates global hypokinesis. Strain was performed and the global longitudinal strain is indeterminate. The left ventricular internal cavity size was moderately to severely dilated. There is moderate asymmetric left ventricular hypertrophy of the inferior and basal segments. Left ventricular diastolic parameters are consistent with Grade III  diastolic dysfunction (restrictive). Right Ventricle: The right ventricular size is mildly enlarged. No increase in right ventricular wall thickness. Right ventricular systolic  function is mildly reduced. Left Atrium: Left atrial size was normal in size. Right Atrium: Right atrial size was normal in size. Pericardium: There is no evidence of pericardial  effusion. Mitral Valve: The mitral valve is grossly normal. Mild mitral valve regurgitation. Tricuspid Valve: The tricuspid valve is normal in structure. Tricuspid valve regurgitation is mild. Aortic Valve: The aortic valve is normal in structure. Aortic valve regurgitation is not visualized. Aortic valve mean gradient measures 2.0 mmHg. Aortic valve peak gradient measures 3.7 mmHg. Aortic valve area, by VTI measures 2.44 cm. Pulmonic Valve: The pulmonic valve was normal in structure. Pulmonic valve regurgitation is not visualized. Aorta: The ascending aorta was not well visualized. IAS/Shunts: No atrial level shunt detected by color flow Doppler. Additional Comments: 3D was performed not requiring image post processing on an independent workstation and was indeterminate.  LEFT VENTRICLE PLAX 2D LVIDd:         5.54 cm      Diastology LVIDs:         2.69 cm      LV e' medial:    3.04 cm/s LV PW:         1.49 cm      LV E/e' medial:  29.8 LV IVS:        0.84 cm      LV e' lateral:   6.67 cm/s LVOT diam:     2.00 cm      LV E/e' lateral: 13.6 LV SV:         47 LV SV Index:   26 LVOT Area:     3.14 cm LV IVRT:       131 msec  LV Volumes (MOD) LV vol d, MOD A2C: 111.0 ml LV vol d, MOD A4C: 132.0 ml LV vol s, MOD A2C: 102.0 ml LV vol s, MOD A4C: 110.0 ml LV SV MOD A2C:     9.0 ml LV SV MOD A4C:     132.0 ml LV SV MOD BP:      15.3 ml RIGHT VENTRICLE RV Basal diam:  2.82 cm RV Mid diam:    2.55 cm RV S prime:     11.90 cm/s TAPSE (M-mode): 1.3 cm LEFT ATRIUM             Index        RIGHT ATRIUM           Index LA diam:        3.50 cm 1.95 cm/m   RA Area:     12.20 cm LA Vol (A2C):   26.5 ml 14.79 ml/m  RA Volume:   26.50 ml  14.79 ml/m LA Vol (A4C):   69.8 ml 38.94 ml/m LA Biplane Vol: 24.5 ml 13.68 ml/m  AORTIC VALVE AV Area (Vmax):     2.59 cm AV Area (Vmean):   2.38 cm AV Area (VTI):     2.44 cm AV Vmax:           96.75 cm/s AV Vmean:          67.650 cm/s AV VTI:            0.192 m AV Peak Grad:      3.7 mmHg AV Mean Grad:      2.0 mmHg LVOT Vmax:         79.80 cm/s LVOT Vmean:        51.200 cm/s LVOT VTI:          0.149 m LVOT/AV VTI ratio: 0.78  AORTA Ao Root diam: 2.90 cm MITRAL VALVE               TRICUSPID VALVE MV Area (PHT):  6.07 cm    TR Peak grad:   8.9 mmHg MV Decel Time: 125 msec    TR Vmax:        149.00 cm/s MV E velocity: 90.70 cm/s MV A velocity: 66.50 cm/s  SHUNTS MV E/A ratio:  1.36        Systemic VTI:  0.15 m                            Systemic Diam: 2.00 cm Cara JONETTA Lovelace MD Electronically signed by Cara JONETTA Lovelace MD Signature Date/Time: 06/06/2024/5:02:03 PM    Final    DG Chest Portable 1 View Result Date: 06/05/2024 EXAM: 1 VIEW(S) XRAY OF THE CHEST 06/05/2024 10:57:00 AM COMPARISON: 10/04/2023 CLINICAL HISTORY: aspirated food, feeling SOB. wheezing on exam FINDINGS: LUNGS AND PLEURA: New diffuse interstitial and patchy lung opacities. Relative sparing of the left upper lobe. Trace right pleural effusion. No pneumothorax. HEART AND MEDIASTINUM: Cardiomegaly. Aortic atherosclerotic calcification. BONES AND SOFT TISSUES: No acute osseous abnormality. IMPRESSION: 1. New diffuse interstitial airspace opacities relatively diffusely. Favor multifocal infection or aspiration over pulmonary edema. 2. Trace right pleural effusion. 3. Aortic atherosclerosis (icd10-i70.0). Electronically signed by: Rockey Kilts MD 06/05/2024 11:54 AM EST RP Workstation: [id number]   (Echo, Carotid, EGD, Colonoscopy, ERCP)    Subjective: Pt c/o fatigue    Discharge Exam: Vitals:   06/16/24 0805 06/16/24 1147  BP: 109/74 101/70  Pulse: 80 86  Resp: 16 16  Temp: 98.2 F (36.8 C) 98.1 F (36.7 C)  SpO2: 93% 95%   Vitals:   06/15/24 2314 06/16/24 0510 06/16/24 0805 06/16/24 1147  BP: 100/67 105/61 109/74 101/70  Pulse: 80  81 80 86  Resp: 17 17 16 16   Temp: 97.9 F (36.6 C) 97.8 F (36.6 C) 98.2 F (36.8 C) 98.1 F (36.7 C)  TempSrc: Oral Oral    SpO2: 94% 93% 93% 95%  Weight:      Height:        General: Pt is alert, awake, not in acute distress Cardiovascular: S1/S2 +, no rubs, no gallops Respiratory: decreased breath sounds b/l otherwise clear Abdominal: Soft, NT, obese, bowel sounds + Extremities:  no cyanosis    The results of significant diagnostics from this hospitalization (including imaging, microbiology, ancillary and laboratory) are listed below for reference.     Microbiology: Recent Results (from the past 240 hours)  Resp panel by RT-PCR (RSV, Flu A&B, Covid) Anterior Nasal Swab     Status: None   Collection Time: 06/12/24  1:36 AM   Specimen: Anterior Nasal Swab  Result Value Ref Range Status   SARS Coronavirus 2 by RT PCR NEGATIVE NEGATIVE Final    Comment: (NOTE) SARS-CoV-2 target nucleic acids are NOT DETECTED.  The SARS-CoV-2 RNA is generally detectable in upper respiratory specimens during the acute phase of infection. The lowest concentration of SARS-CoV-2 viral copies this assay can detect is 138 copies/mL. A negative result does not preclude SARS-Cov-2 infection and should not be used as the sole basis for treatment or other patient management decisions. A negative result may occur with  improper specimen collection/handling, submission of specimen other than nasopharyngeal swab, presence of viral mutation(s) within the areas targeted by this assay, and inadequate number of viral copies(<138 copies/mL). A negative result must be combined with clinical observations, patient history, and epidemiological information. The expected result is Negative.  Fact Sheet for Patients:  bloggercourse.com  Fact Sheet for Healthcare Providers:  seriousbroker.it  This test is no t yet approved or cleared by the [place]  FDA  and  has been authorized for detection and/or diagnosis of SARS-CoV-2 by FDA under an Emergency Use Authorization (EUA). This EUA will remain  in effect (meaning this test can be used) for the duration of the COVID-19 declaration under Section 564(b)(1) of the Act, 21 U.S.C.section 360bbb-3(b)(1), unless the authorization is terminated  or revoked sooner.       Influenza A by PCR NEGATIVE NEGATIVE Final   Influenza B by PCR NEGATIVE NEGATIVE Final    Comment: (NOTE) The Xpert Xpress SARS-CoV-2/FLU/RSV plus assay is intended as an aid in the diagnosis of influenza from Nasopharyngeal swab specimens and should not be used as a sole basis for treatment. Nasal washings and aspirates are unacceptable for Xpert Xpress SARS-CoV-2/FLU/RSV testing.  Fact Sheet for Patients: bloggercourse.com  Fact Sheet for Healthcare Providers: seriousbroker.it  This test is not yet approved or cleared by the [place]  FDA and has been authorized for detection and/or diagnosis of SARS-CoV-2 by FDA under an Emergency Use Authorization (EUA). This EUA will remain in effect (meaning this test can be used) for the duration of the COVID-19 declaration under Section 564(b)(1) of the Act, 21 U.S.C. section 360bbb-3(b)(1), unless the authorization is terminated or revoked.     Resp Syncytial Virus by PCR NEGATIVE NEGATIVE Final    Comment: (NOTE) Fact Sheet for Patients: bloggercourse.com  Fact Sheet for Healthcare Providers: seriousbroker.it  This test is not yet approved or cleared by the [place]  FDA and has been authorized for detection and/or diagnosis of SARS-CoV-2 by FDA under an Emergency Use Authorization (EUA). This EUA will remain in effect (meaning this test can be used) for the duration of the COVID-19 declaration under Section 564(b)(1) of the Act, 21 U.S.C. section 360bbb-3(b)(1),  unless the authorization is terminated or revoked.  Performed at [facility], [address] [place], [place] [address]      Labs: BNP (last 3 results) Recent Labs    06/05/24 1034  BNP 473.9*   Basic Metabolic Panel: Recent Labs  Lab 06/12/24 1821 06/13/24 0522 06/14/24 0405 06/15/24 0610 06/16/24 0259  NA 131* 132* 131* 131* 135  K 3.8 4.4 4.2 4.0 3.3*  CL 92* 94* 91* 91* 89*  CO2 27 28 29 30  34*  GLUCOSE 171* 171* 212* 186* 181*  BUN 6* 8 18 27* 25*  CREATININE 0.77 0.73 0.86 0.79 0.96  CALCIUM  9.2 8.8* 9.1 9.0 9.2   Liver Function Tests: Recent Labs  Lab 06/12/24 0300  AST 13*  ALT 5  ALKPHOS 75  BILITOT 0.3  PROT 5.6*  ALBUMIN 3.4*   No results for input(s): LIPASE, AMYLASE in the last 168 hours. No results for input(s): AMMONIA in the last 168 hours. CBC: Recent Labs  Lab 06/12/24 0150 06/13/24 0522 06/14/24 0405 06/15/24 0610 06/16/24 0259  WBC 15.6* 15.5* 18.4* 15.9* 14.7*  NEUTROABS 13.1*  --   --   --   --   HGB 11.6* 11.2* 12.2 12.3 14.2  HCT 34.7* 33.5* 36.2 37.1 43.1  MCV 83.2 83.1 81.7 82.6 83.5  PLT 323 375 391 385 446*   Cardiac Enzymes: No results for input(s): CKTOTAL, CKMB, CKMBINDEX, TROPONINI in the last 168 hours. BNP: Invalid input(s): POCBNP CBG: Recent Labs  Lab 06/15/24 1138 06/15/24 1632 06/15/24 2015 06/16/24 0806 06/16/24 1148  GLUCAP 133* 205* 242* 146* 172*   D-Dimer No results for input(s): DDIMER in the  last 72 hours. Hgb A1c No results for input(s): HGBA1C in the last 72 hours. Lipid Profile No results for input(s): CHOL, HDL, LDLCALC, TRIG, CHOLHDL, LDLDIRECT in the last 72 hours. Thyroid function studies No results for input(s): TSH, T4TOTAL, T3FREE, THYROIDAB in the last 72 hours.  Invalid input(s): FREET3 Anemia work up No results for input(s): VITAMINB12, FOLATE, FERRITIN, TIBC, IRON, RETICCTPCT in the last 72  hours. Urinalysis    Component Value Date/Time   COLORURINE COLORLESS (A) 11/11/2023 1721   APPEARANCEUR CLEAR (A) 11/11/2023 1721   APPEARANCEUR Hazy (A) 09/05/2022 1519   LABSPEC 1.001 (L) 11/11/2023 1721   LABSPEC 1.026 08/05/2014 1906   PHURINE 8.0 11/11/2023 1721   GLUCOSEU NEGATIVE 11/11/2023 1721   GLUCOSEU NEGATIVE 08/05/2014 1906   HGBUR SMALL (A) 11/11/2023 1721   BILIRUBINUR NEGATIVE 11/11/2023 1721   BILIRUBINUR Negative 09/05/2022 1519   BILIRUBINUR NEGATIVE 08/05/2014 1906   KETONESUR NEGATIVE 11/11/2023 1721   PROTEINUR NEGATIVE 11/11/2023 1721   NITRITE NEGATIVE 11/11/2023 1721   LEUKOCYTESUR MODERATE (A) 11/11/2023 1721   LEUKOCYTESUR 2+ 08/05/2014 1906   Sepsis Labs Recent Labs  Lab 06/13/24 0522 06/14/24 0405 06/15/24 0610 06/16/24 0259  WBC 15.5* 18.4* 15.9* 14.7*   Microbiology Recent Results (from the past 240 hours)  Resp panel by RT-PCR (RSV, Flu A&B, Covid) Anterior Nasal Swab     Status: None   Collection Time: 06/12/24  1:36 AM   Specimen: Anterior Nasal Swab  Result Value Ref Range Status   SARS Coronavirus 2 by RT PCR NEGATIVE NEGATIVE Final    Comment: (NOTE) SARS-CoV-2 target nucleic acids are NOT DETECTED.  The SARS-CoV-2 RNA is generally detectable in upper respiratory specimens during the acute phase of infection. The lowest concentration of SARS-CoV-2 viral copies this assay can detect is 138 copies/mL. A negative result does not preclude SARS-Cov-2 infection and should not be used as the sole basis for treatment or other patient management decisions. A negative result may occur with  improper specimen collection/handling, submission of specimen other than nasopharyngeal swab, presence of viral mutation(s) within the areas targeted by this assay, and inadequate number of viral copies(<138 copies/mL). A negative result must be combined with clinical observations, patient history, and epidemiological information. The expected result  is Negative.  Fact Sheet for Patients:  bloggercourse.com  Fact Sheet for Healthcare Providers:  seriousbroker.it  This test is no t yet approved or cleared by the [place]  FDA and  has been authorized for detection and/or diagnosis of SARS-CoV-2 by FDA under an Emergency Use Authorization (EUA). This EUA will remain  in effect (meaning this test can be used) for the duration of the COVID-19 declaration under Section 564(b)(1) of the Act, 21 U.S.C.section 360bbb-3(b)(1), unless the authorization is terminated  or revoked sooner.       Influenza A by PCR NEGATIVE NEGATIVE Final   Influenza B by PCR NEGATIVE NEGATIVE Final    Comment: (NOTE) The Xpert Xpress SARS-CoV-2/FLU/RSV plus assay is intended as an aid in the diagnosis of influenza from Nasopharyngeal swab specimens and should not be used as a sole basis for treatment. Nasal washings and aspirates are unacceptable for Xpert Xpress SARS-CoV-2/FLU/RSV testing.  Fact Sheet for Patients: bloggercourse.com  Fact Sheet for Healthcare Providers: seriousbroker.it  This test is not yet approved or cleared by the [place]  FDA and has been authorized for detection and/or diagnosis of SARS-CoV-2 by FDA under an Emergency Use Authorization (EUA). This EUA will remain in effect (meaning this test can  be used) for the duration of the COVID-19 declaration under Section 564(b)(1) of the Act, 21 U.S.C. section 360bbb-3(b)(1), unless the authorization is terminated or revoked.     Resp Syncytial Virus by PCR NEGATIVE NEGATIVE Final    Comment: (NOTE) Fact Sheet for Patients: bloggercourse.com  Fact Sheet for Healthcare Providers: seriousbroker.it  This test is not yet approved or cleared by the [place]  FDA and has been authorized for detection and/or diagnosis of  SARS-CoV-2 by FDA under an Emergency Use Authorization (EUA). This EUA will remain in effect (meaning this test can be used) for the duration of the COVID-19 declaration under Section 564(b)(1) of the Act, 21 U.S.C. section 360bbb-3(b)(1), unless the authorization is terminated or revoked.  Performed at [facility], [address], [place], [place] [address]      Time coordinating discharge: 36 minutes  SIGNED:   Anthony CHRISTELLA Pouch, MD  [facility] 06/16/2024, 1:49 PM Pager   If 7PM-7AM, please contact night-coverage [email or web address]

## 2024-06-16 NOTE — [place] FL2 (Signed)
 [place]  MEDICAID FL2 LEVEL OF CARE FORM     IDENTIFICATION  Patient Name: Victoria Medina Birthdate: 1961-03-21 Sex: female Admission Date (Current Location): 06/12/2024  [facility] and [place] Number:  Chiropodist and Address:  [facility], [address], [place], [place] [address]      Provider Number: [id number]  Attending Physician Name and Address:  Trudy Anthony HERO, MD  Relative Name and Phone Number:       Current Level of Care:   Recommended Level of Care:   Prior Approval Number:    Date Approved/Denied:   PASRR Number: [id number]  Discharge Plan: SNF    Current Diagnoses: Patient Active Problem List   Diagnosis Date Noted   Acute respiratory failure with hypoxia (HCC) 06/12/2024   Constipation 06/08/2024   Blindness of both eyes 06/08/2024   Acute hypoxic respiratory failure (HCC) 06/05/2024   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, unspecified laterality (HCC) 09/10/2023   Complicated UTI (urinary tract infection) 09/13/2022   Psychosis (HCC) 09/13/2022   DNR (do not resuscitate) 09/13/2022   HFrEF (heart failure with reduced ejection fraction) (HCC) 06/09/2022   Major depressive disorder, recurrent, moderate (HCC) 06/09/2022   Suprapubic catheter (HCC) 10/09/2021   History of stroke 10/09/2021   HLD (hyperlipidemia) 10/09/2021   Coronary artery disease involving native coronary artery of native heart with unstable angina pectoris (HCC) 10/09/2021   Microcytic anemia 10/09/2021   Leukocytosis 04/22/2021   Acute ST elevation myocardial infarction (STEMI) of inferolateral wall (HCC) 01/25/2020   Slurred speech    GERD (gastroesophageal reflux disease) 12/18/2014   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   NSTEMI (non-ST elevated myocardial infarction) (HCC) 10/27/2013   Left knee pain 08/22/2013   High risk medications (not anticoagulants) long-term use 08/22/2013   Essential hypertension 07/11/2013    Hypothyroidism 06/10/2013   Impingement syndrome of right shoulder 07/07/2012   Right shoulder pain 07/07/2012   Obesity, morbid (HCC) 06/04/2012   DVT (deep venous thrombosis) (HCC) 04/03/2012   PE (pulmonary thromboembolism) (HCC) 04/02/2012   Cervical intraepithelial glandular neoplasia 02/07/2011   GAD (generalized anxiety disorder) 02/07/2011   Insomnia 02/07/2011   Intractable chronic migraine without aura 02/07/2011   Mixed hyperlipidemia 02/07/2011   Myalgia and myositis, unspecified 02/07/2011   Obstructive sleep apnea (adult) (pediatric) 02/07/2011    Orientation RESPIRATION BLADDER Height & Weight     Self, Time, Situation, Place  Normal Incontinent Weight: 180 lb 12.4 oz (82 kg) Height:  5' (152.4 cm)  BEHAVIORAL SYMPTOMS/MOOD NEUROLOGICAL BOWEL NUTRITION STATUS      Continent    AMBULATORY STATUS COMMUNICATION OF NEEDS Skin     Verbally Normal                       Personal Care Assistance Level of Assistance              Functional Limitations Info             SPECIAL CARE FACTORS FREQUENCY  PT (By licensed PT), OT (By licensed OT)     PT Frequency: 5x OT Frequency: 5x            Contractures Contractures Info: Not present    Additional Factors Info                  Current Medications (06/16/2024):  This is the current hospital active medication list Current Facility-Administered Medications  Medication Dose Route Frequency Provider Last Rate Last Admin  acetaminophen  (TYLENOL ) tablet 650 mg  650 mg Oral Q6H PRN Zella, Mir M, MD   650 mg at 06/13/24 2154   Or   acetaminophen  (TYLENOL ) suppository 650 mg  650 mg Rectal Q6H PRN Zella, Mir M, MD       albuterol (PROVENTIL) (2.5 MG/3ML) 0.083% nebulizer solution 2.5 mg  2.5 mg Nebulization Q2H PRN Zella, Mir M, MD       aspirin  EC tablet 81 mg  81 mg Oral Daily Ikramullah, Mir M, MD   81 mg at 06/16/24 1112   enoxaparin  (LOVENOX ) injection 40 mg  40 mg Subcutaneous  Q24H Zella, Mir M, MD   40 mg at 06/16/24 1114   fluticasone  (FLONASE ) 50 MCG/ACT nasal spray 1 spray  1 spray Each Nare BID Zella, Mir M, MD   1 spray at 06/16/24 1114   furosemide (LASIX) tablet 40 mg  40 mg Oral Daily Hudson, Caralyn, PA-C   40 mg at 06/16/24 1113   insulin  aspart (novoLOG ) injection 0-15 Units  0-15 Units Subcutaneous TID WC Zella, Mir M, MD   3 Units at 06/16/24 1330   insulin  aspart (novoLOG ) injection 0-5 Units  0-5 Units Subcutaneous QHS Zella, Mir M, MD   2 Units at 06/15/24 2145   ipratropium-albuterol (DUONEB) 0.5-2.5 (3) MG/3ML nebulizer solution 3 mL  3 mL Nebulization Q6H PRN Trudy Anthony HERO, MD       isosorbide mononitrate (IMDUR) 24 hr tablet 60 mg  60 mg Oral Daily Ikramullah, Mir M, MD   60 mg at 06/16/24 1113   levothyroxine  (SYNTHROID ) tablet 25 mcg  25 mcg Oral QAC breakfast Zella, Mir M, MD   25 mcg at 06/16/24 0541   losartan  (COZAAR ) tablet 100 mg  100 mg Oral Daily Zella, Mir M, MD   100 mg at 06/16/24 1112   methylPREDNISolone  sodium succinate  (SOLU-MEDROL ) 125 mg/2 mL injection 60 mg  60 mg Intravenous Q24H Trudy Anthony HERO, MD   60 mg at 06/16/24 1330   mirabegron  ER (MYRBETRIQ ) tablet 25 mg  25 mg Oral Daily Ikramullah, Mir M, MD   25 mg at 06/16/24 1119   nitroGLYCERIN  (NITROSTAT ) SL tablet 0.4 mg  0.4 mg Sublingual Q5 min PRN Hudson, Caralyn, PA-C   0.4 mg at 06/12/24 1507   OLANZapine  (ZYPREXA ) tablet 5 mg  5 mg Oral Daily Ikramullah, Mir M, MD   5 mg at 06/16/24 1119   ondansetron  (ZOFRAN ) tablet 4 mg  4 mg Oral Q6H PRN Zella Katha HERO, MD       Or   ondansetron  (ZOFRAN ) injection 4 mg  4 mg Intravenous Q6H PRN Zella, Mir M, MD       pantoprazole  (PROTONIX ) EC tablet 20 mg  20 mg Oral Daily Ikramullah, Mir M, MD   20 mg at 06/16/24 1119   polyethylene glycol (MIRALAX  / GLYCOLAX ) packet 17 g  17 g Oral Daily Zella, Mir M, MD   17 g at 06/16/24 1118   ranolazine (RANEXA) 12 hr tablet 500 mg  500 mg Oral  BID Ikramullah, Mir M, MD   500 mg at 06/16/24 1113   rosuvastatin (CRESTOR) tablet 5 mg  5 mg Oral Once per day on Tuesday Thursday Saturday Zella, Mir M, MD   5 mg at 06/14/24 1016   senna-docusate (Senokot-S) tablet 2 tablet  2 tablet Oral QHS Zella, Mir M, MD   2 tablet at 06/15/24 2145   spironolactone (ALDACTONE) tablet 12.5 mg  12.5 mg Oral Daily Hudson,  Caralyn, PA-C       traZODone (DESYREL) tablet 25 mg  25 mg Oral QHS PRN Zella Katha HERO, MD         Discharge Medications: Please see discharge summary for a list of discharge medications.  Relevant Imaging Results:  Relevant Lab Results:   Additional Information    Alfonso Rummer, LCSW

## 2024-06-16 NOTE — Plan of Care (Signed)

## 2024-06-17 ENCOUNTER — Non-Acute Institutional Stay (SKILLED_NURSING_FACILITY): Payer: Self-pay | Admitting: Internal Medicine

## 2024-06-17 ENCOUNTER — Encounter: Payer: Self-pay | Admitting: Internal Medicine

## 2024-06-17 ENCOUNTER — Other Ambulatory Visit: Payer: Self-pay | Admitting: Internal Medicine

## 2024-06-17 DIAGNOSIS — I69354 Hemiplegia and hemiparesis following cerebral infarction affecting left non-dominant side: Secondary | ICD-10-CM

## 2024-06-17 DIAGNOSIS — I10 Essential (primary) hypertension: Secondary | ICD-10-CM | POA: Diagnosis not present

## 2024-06-17 DIAGNOSIS — E039 Hypothyroidism, unspecified: Secondary | ICD-10-CM

## 2024-06-17 DIAGNOSIS — E66812 Obesity, class 2: Secondary | ICD-10-CM

## 2024-06-17 DIAGNOSIS — I5022 Chronic systolic (congestive) heart failure: Secondary | ICD-10-CM

## 2024-06-17 DIAGNOSIS — Z5181 Encounter for therapeutic drug level monitoring: Secondary | ICD-10-CM | POA: Insufficient documentation

## 2024-06-17 DIAGNOSIS — H47619 Cortical blindness, unspecified side of brain: Secondary | ICD-10-CM

## 2024-06-17 DIAGNOSIS — Z79899 Other long term (current) drug therapy: Secondary | ICD-10-CM

## 2024-06-17 DIAGNOSIS — Z9359 Other cystostomy status: Secondary | ICD-10-CM

## 2024-06-17 DIAGNOSIS — K219 Gastro-esophageal reflux disease without esophagitis: Secondary | ICD-10-CM

## 2024-06-17 DIAGNOSIS — Z66 Do not resuscitate: Secondary | ICD-10-CM

## 2024-06-17 DIAGNOSIS — F411 Generalized anxiety disorder: Secondary | ICD-10-CM

## 2024-06-17 DIAGNOSIS — F22 Delusional disorders: Secondary | ICD-10-CM | POA: Insufficient documentation

## 2024-06-17 MED ORDER — DIAZEPAM 10 MG PO TABS: 10.0000 mg | ORAL_TABLET | Freq: Four times a day (QID) | ORAL | 0 refills | Status: AC

## 2024-06-17 NOTE — Assessment & Plan Note (Addendum)
 06/17/24 during her November 2025 hospital admission, cardiology stated that patient is intolerant to beta-blockade.  Continue with ARB, Lasix, Aldactone.  Patient has a chronic Foley.  Do not think that Farxiga or other SGLT-2 inhibitors are appropriate given her history of complicated UTIs.  Continue with Lasix 40 mg daily, Aldactone 12.5 mg daily, losartan  100 mg daily.

## 2024-06-17 NOTE — Assessment & Plan Note (Addendum)
 06/17/24 Continue Synthroid  25 mcg daily

## 2024-06-17 NOTE — Assessment & Plan Note (Addendum)
 06/17/24 chronic and at baseline.

## 2024-06-17 NOTE — Progress Notes (Signed)
 [facility] Admission H&P  Provider:  Location:    Nursing Home Room Number: [place], [place] - rm 405 Place of Service:  SNF (31)   PCP: [name], DO Patient Care Team: [name], DO as PCP - General (Internal Medicine) [name], MD as PCP - Cardiology (Cardiology)   Extended Emergency Contact Information Primary Emergency Contact: Cronce [facility]  [place] Home Phone: [phone] Mobile Phone: [phone] Relation: Sister Secondary Emergency Contact: [name] Address: [address]          [place], [place] [address] [place] Home Phone: [phone] Relation: Mother   Goals of Care: Advanced Directive information    06/13/2024    9:00 AM  Advanced Directives  Does Patient Have a Medical Advance Directive? Yes  Type of Advance Directive Out of facility DNR (pink MOST or yellow form)  Does patient want to make changes to medical advance directive? No - Patient declined  Pre-existing out of facility DNR order (yellow form or pink MOST form) Pink Most/Yellow Form available - Physician notified to receive inpatient order    CODE STATUS: Full Code Do Not Resuscitate (DNR)    Chief Complaint  Patient presents with   Medical Management of Chronic Issues    Admission back to SNF for long term care.     HPI: Patient is a 63 y.o. female seen today for admission to [facility], room 405 for long-term care.  Ms. Victoria Medina is a 63 year old female with a prior history of stroke, dense left-sided hemiparesis, chronic suprapubic tube, chronic blindness, bedridden, who was admitted to the hospital at [facility] on 06/12/2024 due to hypoxia.  She had been recently discharged from the hospital on June 10, 2024 after spending 4 days in the hospital due to an aspiration event.  She was admitted again on the 13th after she became hypoxic in the middle the night at [facility] with a room air  saturation of 70%.  She was placed on a nonrebreather by EMS.  She is admitted to hospital with acute hypoxic respiratory failure, acute on chronic systolic congestive heart failure.  When she was the hospital in the early part of November, her echocardiogram demonstrated an LVEF of about 20%.  Does not appear that she was discharged from the hospital on June 08, 2024 with diuretics.  Cardiology was consulted during her hospital admission to manage her acute heart failure.  She had to be admitted to progressive unit due to respiratory failure requiring BiPAP therapy.  During the 5-day hospital stay, she was managed by the hospitalist service and cardiology.  She was diuresed with IV Lasix.  Cardiology stated that the patient has been unable to take beta-blockers in the past.  She was weaned to room air.  She was discharged back to [facility] on Lasix 40 mg daily, Aldactone 12.5 mg daily, losartan  100 mg daily.  Cardiology already stated that she cannot tolerate beta-blockers.  She was continued on Imdur 60 mg daily.  This is a comprehensive admission note to this SNFperformed on this date less than 30 days from date of admission. Included are preadmission medical/surgical history; reconciled medication list; family history; social history and comprehensive review of systems.  Corrections and additions to the records were documented. Comprehensive physical exam was also performed. Additionally a clinical summary was entered for each active diagnosis pertinent to this admission in the Problem List to enhance continuity of care.   Past Medical  History:  Diagnosis Date   Acute ST elevation myocardial infarction (STEMI) of inferolateral wall (HCC) 01/25/2020   Blind    Cervical intraepithelial glandular neoplasia 02/07/2011   Cholecystitis 04/22/2021   DVT (deep venous thrombosis) (HCC) 04/03/2012   History of migraine headaches    History of pulmonary embolism 04/02/2012   September 2013 after  knee injury- hyperextension- . U/S of lower extremity revealed DVT in L leg. CTA revealed bilateral lower lobe segmental PEs and R upper lobe segmental PE. Patient was started on lovenox  and transferred upstairs. Cardiac enzymes were negative x1. NOAC for 7 months.         History of stroke 10/09/2021   Hyperlipemia    Myocardial infarction [facility])    NSTEMI (non-ST elevated myocardial infarction) (HCC) 10/27/2013   Formatting of this note might be different from the original.  01/08/2014: LHC: 2nd Marginal: 99% (pre) to 0% (post); STENT, PROMUS PREMIER MR 2.25X12MM     12/04/2013: LHC: Distal LAD: 70% (pre) to 0% (post) CATH, MINI TREK RX 2.0X20MM STENT, PROMUS PREMIER MR 2.25X28MM; Mid LAD: 70% (pre) to 0% (post), STENT, PROMUS PREMIER MR 2.50X12MM     10/27/2013. LHC: 3 vessel Disease, Stent x2:  Proximal LAD:   Stroke [facility])    Past Surgical History:  Procedure Laterality Date   ABDOMINAL HYSTERECTOMY     BACK SURGERY     CARDIAC CATHETERIZATION     CORONARY/GRAFT ACUTE MI REVASCULARIZATION N/A 01/25/2020   Procedure: Coronary/Graft Acute MI Revascularization;  Surgeon: [name], MD;  Location: [facility] INVASIVE CV LAB;  Service: Cardiovascular;  Laterality: N/A;   FRACTURE SURGERY     IR CATHETER TUBE CHANGE  08/10/2021   LEFT HEART CATH AND CORONARY ANGIOGRAPHY N/A 01/25/2020   Procedure: LEFT HEART CATH AND CORONARY ANGIOGRAPHY;  Surgeon: [name], MD;  Location: [facility] INVASIVE CV LAB;  Service: Cardiovascular;  Laterality: N/A;   SKIN GRAFT Right    TOTAL ABDOMINAL HYSTERECTOMY Bilateral     reports that she has never smoked. She has never used smokeless tobacco. She reports current alcohol use. She reports that she does not use drugs. Social History   Socioeconomic History   Marital status: Single    Spouse name: Not on file   Number of children: Not on file   Years of education: Not on file   Highest education level: Not on file  Occupational History   Not on file   Tobacco Use   Smoking status: Never   Smokeless tobacco: Never  Vaping Use   Vaping status: Never Used  Substance and Sexual Activity   Alcohol use: Yes    Comment: occasional   Drug use: No   Sexual activity: Not Currently  Other Topics Concern   Not on file  Social History Narrative   Not on file   Social Drivers of Health   Financial Resource Strain: Not on file  Food Insecurity: No Food Insecurity (06/13/2024)   Hunger Vital Sign    Worried About Running Out of Food in the Last Year: Never true    Ran Out of Food in the Last Year: Never true  Transportation Needs: No Transportation Needs (06/13/2024)   PRAPARE - Administrator, Civil Service (Medical): No    Lack of Transportation (Non-Medical): No  Physical Activity: Not on file  Stress: Not on file  Social Connections: Not on file  Intimate Partner Violence: Not At Risk (06/13/2024)   Humiliation, Afraid, Rape, and Kick questionnaire  Fear of Current or Ex-Partner: No    Emotionally Abused: No    Physically Abused: No    Sexually Abused: No     Functional Status Survey:     Family History  Problem Relation Age of Onset   Heart attack Mother    Hypertension Mother    Hypercholesterolemia Mother    Diabetes Mother    Stroke Father    Heart attack Father    Hypertension Father    Hypercholesterolemia Father    Diabetes Father    Diabetes Maternal Grandmother    Cancer - Other Maternal Grandmother      Health Maintenance  Topic Date Due   Pneumococcal Vaccine: 50+ Years (1 of 2 - PCV) Never done   Diabetic kidney evaluation - Urine ACR  04/16/2024   Colonoscopy  10/10/2024 (Originally 07/09/2006)   OPHTHALMOLOGY EXAM  10/22/2024 (Originally 07/10/1971)   Mammogram  11/29/2024   HEMOGLOBIN A1C  12/10/2024   Medicare Annual Wellness (AWV)  12/26/2024   FOOT EXAM  05/21/2025   Diabetic kidney evaluation - eGFR measurement  06/16/2025   DTaP/Tdap/Td (3 - Td or Tdap) 11/16/2026    Hepatitis C Screening  Completed   HIV Screening  Completed   Zoster Vaccines- Shingrix  Completed   Hepatitis B Vaccines 19-59 Average Risk  Aged Out   HPV VACCINES  Aged Out   Meningococcal B Vaccine  Aged Out   Influenza Vaccine  Discontinued   COVID-19 Vaccine  Discontinued     Allergies  Allergen Reactions   Levaquin [Levofloxacin In D5w] Anaphylaxis and Swelling   Iodinated Contrast Media Itching    Severe itching   Other     Dust mites and opiates (all of them)   Latex Dermatitis     Outpatient Encounter Medications as of 06/17/2024  Medication Sig   aspirin  EC 81 MG tablet Take 81 mg by mouth daily.   Cholecalciferol (VITAMIN D3) 1.25 MG (50000 UT) CAPS Take 1 capsule by mouth once a week.  every Mon for Supplement   Dextromethorphan-Benzocaine (COUGH/SORE THROAT LOZENGES MT) Use as directed 1 drop in the mouth or throat every 4 (four) hours as needed.   diazepam  (VALIUM ) 10 MG tablet Take 1 tablet (10 mg total) by mouth 4 (four) times daily.   fluticasone  (FLONASE ) 50 MCG/ACT nasal spray Place 1 spray into both nostrils 2 (two) times daily.   furosemide (LASIX) 40 MG tablet Take 1 tablet (40 mg total) by mouth daily.   ibuprofen  (ADVIL ) 800 MG tablet Take 800 mg by mouth 2 (two) times daily as needed for mild pain (pain score 1-3) or moderate pain (pain score 4-6).   isosorbide mononitrate (IMDUR) 60 MG 24 hr tablet Take 60 mg by mouth daily.   lansoprazole (PREVACID) 15 MG capsule Take 15 mg by mouth 2 (two) times daily before a meal.   levothyroxine  (SYNTHROID ) 25 MCG tablet Take 25 mcg by mouth daily before breakfast.   losartan  (COZAAR ) 100 MG tablet Take 100 mg by mouth daily.   magnesium  hydroxide (MILK OF MAGNESIA) 400 MG/5ML suspension Take 5 mLs by mouth every 6 (six) hours as needed for mild constipation.   nitroGLYCERIN  (NITROSTAT ) 0.4 MG SL tablet Place 0.4 mg under the tongue every 5 (five) minutes x 3 doses as needed for chest pain.    OLANZapine  (ZYPREXA ) 5  MG tablet Take 5 mg by mouth daily.   ondansetron  (ZOFRAN -ODT) 4 MG disintegrating tablet Take 4 mg by mouth every 4 (four)  hours as needed for nausea or vomiting.   polyethylene glycol (MIRALAX  / GLYCOLAX ) 17 g packet Take 17 g by mouth daily.   predniSONE (DELTASONE) 20 MG tablet Take 2 tablets (40 mg total) by mouth daily for 5 days.   ranolazine (RANEXA) 500 MG 12 hr tablet Take 500 mg by mouth 2 (two) times daily.   rosuvastatin (CRESTOR) 5 MG tablet Take 5 mg by mouth daily. Every Tuesday,Thursday,Sat   senna-docusate (SENOKOT-S) 8.6-50 MG tablet Take 2 tablets by mouth at bedtime.   spironolactone (ALDACTONE) 25 MG tablet Take 0.5 tablets (12.5 mg total) by mouth daily.   Vibegron  (GEMTESA ) 75 MG TABS Take 75 mg by mouth daily.   [DISCONTINUED] promethazine  (PHENERGAN ) 12.5 MG suppository Place 1 suppository (12.5 mg total) rectally every 6 (six) hours as needed for nausea or vomiting. (Patient not taking: Reported on 06/12/2024)   No facility-administered encounter medications on file as of 06/17/2024.     Review of Systems  Constitutional: Negative.   HENT: Negative.    Eyes: Negative.   Respiratory: Negative.    Cardiovascular: Negative.   Gastrointestinal: Negative.   Endocrine: Negative.   Genitourinary: Negative.   Allergic/Immunologic: Negative.   Neurological: Negative.   Hematological: Negative.   Psychiatric/Behavioral: Negative.    All other systems reviewed and are negative.    Vitals:   06/16/24 2306  BP: 122/80  Pulse: 81  Resp: 18  Temp: (!) 97.4 F (36.3 C)  SpO2: 94%  Weight: 179 lb 12.8 oz (81.6 kg)   Body mass index is 35.11 kg/m. Physical Exam Vitals and nursing note reviewed.  Constitutional:      Comments: Awake and alert.  Chronically ill-appearing.  Obese.  Eyes:     Comments: Blind in both eyes.  Cardiovascular:     Rate and Rhythm: Normal rate and regular rhythm.  Pulmonary:     Effort: Pulmonary effort is normal.     Breath sounds:  Normal breath sounds.  Abdominal:     General: Bowel sounds are normal. There is no distension.     Palpations: Abdomen is soft.     Comments: Suprapubic tube present.  Draining clear yellow urine.  Musculoskeletal:     Right lower leg: No edema.     Left lower leg: No edema.  Skin:    General: Skin is warm and dry.     Capillary Refill: Capillary refill takes less than 2 seconds.  Neurological:     Comments: Dense left-sided hemiparesis.  Left upper extremity held in flexion contracture at the elbow, wrist and fingers.  Spastic in nature.  Patient is blind but otherwise alert and oriented.      Labs reviewed: Basic Metabolic Panel: Recent Labs    06/05/24 1433 06/06/24 1240 06/14/24 0405 06/15/24 0610 06/16/24 0259  NA  --    < > 131* 131* 135  K  --    < > 4.2 4.0 3.3*  CL  --    < > 91* 91* 89*  CO2  --    < > 29 30 34*  GLUCOSE  --    < > 212* 186* 181*  BUN  --    < > 18 27* 25*  CREATININE  --    < > 0.86 0.79 0.96  CALCIUM   --    < > 9.1 9.0 9.2  MG 1.8  --   --   --   --    < > = values in this interval  not displayed.   Liver Function Tests: Recent Labs    11/11/23 1336 06/05/24 1034 06/12/24 0300  AST 16 18 13*  ALT 12 8 5   ALKPHOS 98 83 75  BILITOT 0.7 0.5 0.3  PROT 7.4 7.5 5.6*  ALBUMIN 4.0 3.6 3.4*   Recent Labs    11/11/23 1336  LIPASE 36   CBC: Recent Labs    03/03/24 0000 06/05/24 1034 06/06/24 1240 06/12/24 0150 06/13/24 0522 06/14/24 0405 06/15/24 0610 06/16/24 0259  WBC 10.2 14.5*   < > 15.6*   < > 18.4* 15.9* 14.7*  NEUTROABS 8,007.00 12.5*  --  13.1*  --   --   --   --   HGB 13.1 12.9   < > 11.6*   < > 12.2 12.3 14.2  HCT 40 40.3   < > 34.7*   < > 36.2 37.1 43.1  MCV  --  84.5   < > 83.2   < > 81.7 82.6 83.5  PLT 348 354   < > 323   < > 391 385 446*   < > = values in this interval not displayed.   Lab Results  Component Value Date   HGBA1C 6.1 (H) 06/12/2024   Lab Results  Component Value Date   TSH 2.44 06/04/2023    Lab Results  Component Value Date   IRON 25 11/08/2023   TIBC 398 11/08/2023   FERRITIN 12 11/08/2023     Imaging and Procedures obtained prior to SNF admission: DG Chest Port 1 View Result Date: 06/12/2024 CLINICAL DATA:  Cough EXAM: PORTABLE CHEST 1 VIEW COMPARISON:  06/05/2024 FINDINGS: Cardiac shadow is stable. Aortic calcifications are noted. Diffuse interstitial changes are seen consistent with edema. No effusion is seen. No focal infiltrate is noted. Postsurgical changes in the cervical spine are seen. IMPRESSION: Interstitial edema. Electronically Signed   By: Oneil Devonshire M.D.   On: 06/12/2024 02:06     Assessment/Plan  Assessment & Plan Essential hypertension 06/17/24 continue with Lasix 40 mg daily, Aldactone 12.5 mg daily, Imdur 60 mg daily, losartan  100 mg daily      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) 06/17/24 chronic spastic left-sided hemiplegia      Chronic systolic CHF (congestive heart failure) (HCC) - LVEF 20-25% 06/17/24 during her November 2025 hospital admission, cardiology stated that patient is intolerant to beta-blockade.  Continue with ARB, Lasix, Aldactone.  Patient has a chronic Foley.  Do not think that Farxiga or other SGLT-2 inhibitors are appropriate given her history of complicated UTIs.  Continue with Lasix 40 mg daily, Aldactone 12.5 mg daily, losartan  100 mg daily.      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      Gastroesophageal reflux disease without esophagitis 06/17/24 continue Prevacid 15 mg twice daily      GAD (generalized anxiety disorder) 06/17/24 Continue with Valium  10 mg 4 times a day.  She has been on this long-term.  Patient has a long history of anxiety.  Attempts at weaning have been unsuccessful in the past.  Attempts at weaning benzodiazepines causes the patient extreme anxiety and  should not be reattempted.      Obesity, Class II, BMI 35-39.9 Body mass index is 35.11 kg/m.      Acquired hypothyroidism 06/17/24 Continue Synthroid  25 mcg daily      Suprapubic catheter (HCC) 06/17/24 chronic suprapubic catheter.  Her having  a chronic suprapubic catheter and a history of complicated UTIs is a contraindication to starting SGLT-2 inhibitors for her CHF.      DNR (do not resuscitate)      Delusional disorder (HCC) 06/17/24 started on Zyprexa  by Dr. Abdul on 2-12-204 for hallucinations at 2.5 mg twice daily.  On January 31, 2023, her dose was increased to 5 mg twice daily due to the patient's increased hallucinations causing a fall out of bed.  Dr. Abdul documents on her January 31, 2023 note that the patient had failed dose reductions in her Zyprexa .  It appears around April 2025, her Zyprexa  dose was decreased to 5 mg once a day due to hyponatremia.  Given her recent 2 hospitalizations in the month November, we will give her a few weeks to settle back into her routine and consider dose reduction again in December 2025.     Encounter for medication titration 06/17/24 started on Zyprexa  by Dr. Abdul on 2-12-204 for hallucinations at 2.5 mg twice daily.  On January 31, 2023, her dose was increased to 5 mg twice daily due to the patient's increased hallucinations causing a fall out of bed.  Dr. Abdul documents on her January 31, 2023 note that the patient had failed dose reductions in her Zyprexa .  It appears around April 2025, her Zyprexa  dose was decreased to 5 mg once a day due to hyponatremia.  Given her recent 2 hospitalizations in the month November, we will give her a few weeks to settle back into her routine and consider dose reduction again in December 2025.        Family/ staff Communication: no family at bedside.   Labs/tests ordered: routine labs ordered for November 2025.    [name] Door, DO [facility] [phone]

## 2024-06-17 NOTE — Assessment & Plan Note (Addendum)
 06/17/24 continue Prevacid 15 mg twice daily

## 2024-06-17 NOTE — Assessment & Plan Note (Addendum)
 06/17/24 continue with Lasix 40 mg daily, Aldactone 12.5 mg daily, Imdur 60 mg daily, losartan  100 mg daily

## 2024-06-17 NOTE — Assessment & Plan Note (Addendum)
 06/17/24 chronic spastic left-sided hemiplegia

## 2024-06-17 NOTE — Assessment & Plan Note (Addendum)
 06/17/24 Continue with Valium  10 mg 4 times a day.  She has been on this long-term.  Patient has a long history of anxiety.  Attempts at weaning have been unsuccessful in the past.  Attempts at weaning benzodiazepines causes the patient extreme anxiety and should not be reattempted.

## 2024-06-17 NOTE — Assessment & Plan Note (Addendum)
 Victoria Medina

## 2024-06-17 NOTE — Assessment & Plan Note (Addendum)
 06/17/24 chronic suprapubic catheter.  Her having a chronic suprapubic catheter and a history of complicated UTIs is a contraindication to starting SGLT-2 inhibitors for her CHF.

## 2024-06-17 NOTE — Assessment & Plan Note (Signed)
 SABRA

## 2024-06-17 NOTE — Assessment & Plan Note (Addendum)
 06/17/24 started on Zyprexa  by Dr. Abdul on 2-12-204 for hallucinations at 2.5 mg twice daily.  On January 31, 2023, her dose was increased to 5 mg twice daily due to the patient's increased hallucinations causing a fall out of bed.  Dr. Abdul documents on her January 31, 2023 note that the patient had failed dose reductions in her Zyprexa .  It appears around April 2025, her Zyprexa  dose was decreased to 5 mg once a day due to hyponatremia.  Given her recent 2 hospitalizations in the month November, we will give her a few weeks to settle back into her routine and consider dose reduction again in December 2025.

## 2024-06-17 NOTE — Assessment & Plan Note (Addendum)
Body mass index is 35.11 kg/m.

## 2024-06-20 ENCOUNTER — Ambulatory Visit (INDEPENDENT_AMBULATORY_CARE_PROVIDER_SITE_OTHER): Admitting: Physician Assistant

## 2024-06-20 DIAGNOSIS — Z435 Encounter for attention to cystostomy: Secondary | ICD-10-CM

## 2024-06-20 NOTE — Progress Notes (Signed)
 Suprapubic Cath Change  Patient is present today for a suprapubic catheter change due to urinary retention.  8ml of water was drained from the balloon, a 18FR foley cath was removed from the tract with out difficulty.  Site was cleaned and prepped in a sterile fashion with betadine.  A 18FR Silastic foley cath was replaced into the tract no complications were noted. Urine return was noted, 10 ml of sterile water was inflated into the balloon and a night bag was attached for drainage.  Patient tolerated well.  Performed by: Mattisyn Cardona, PA-C   Follow up: Return in about 4 weeks (around 07/18/2024) for SPT exchange.
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
## 2024-07-07 ENCOUNTER — Other Ambulatory Visit: Payer: Self-pay

## 2024-07-07 ENCOUNTER — Inpatient Hospital Stay
Admission: EM | Admit: 2024-07-07 | Discharge: 2024-07-12 | DRG: 640 | Disposition: A | Discharge status: Skilled Nursing Facility | Source: Skilled Nursing Facility | Attending: Obstetrics and Gynecology | Admitting: Obstetrics and Gynecology

## 2024-07-07 ENCOUNTER — Emergency Department

## 2024-07-07 DIAGNOSIS — E66812 Obesity, class 2: Secondary | ICD-10-CM | POA: Diagnosis present

## 2024-07-07 DIAGNOSIS — J9601 Acute respiratory failure with hypoxia: Secondary | ICD-10-CM | POA: Diagnosis present

## 2024-07-07 DIAGNOSIS — Z6835 Body mass index (BMI) 35.0-35.9, adult: Secondary | ICD-10-CM | POA: Diagnosis not present

## 2024-07-07 DIAGNOSIS — I10 Essential (primary) hypertension: Secondary | ICD-10-CM | POA: Diagnosis present

## 2024-07-07 DIAGNOSIS — F331 Major depressive disorder, recurrent, moderate: Secondary | ICD-10-CM | POA: Diagnosis present

## 2024-07-07 DIAGNOSIS — Z7982 Long term (current) use of aspirin: Secondary | ICD-10-CM | POA: Diagnosis not present

## 2024-07-07 DIAGNOSIS — K219 Gastro-esophageal reflux disease without esophagitis: Secondary | ICD-10-CM | POA: Diagnosis present

## 2024-07-07 DIAGNOSIS — R0602 Shortness of breath: Principal | ICD-10-CM

## 2024-07-07 DIAGNOSIS — I255 Ischemic cardiomyopathy: Secondary | ICD-10-CM | POA: Diagnosis present

## 2024-07-07 DIAGNOSIS — Z9359 Other cystostomy status: Secondary | ICD-10-CM | POA: Diagnosis not present

## 2024-07-07 DIAGNOSIS — E119 Type 2 diabetes mellitus without complications: Secondary | ICD-10-CM

## 2024-07-07 DIAGNOSIS — E871 Hypo-osmolality and hyponatremia: Secondary | ICD-10-CM | POA: Diagnosis present

## 2024-07-07 DIAGNOSIS — E039 Hypothyroidism, unspecified: Secondary | ICD-10-CM | POA: Diagnosis present

## 2024-07-07 DIAGNOSIS — E873 Alkalosis: Secondary | ICD-10-CM | POA: Diagnosis present

## 2024-07-07 DIAGNOSIS — Z66 Do not resuscitate: Secondary | ICD-10-CM | POA: Diagnosis present

## 2024-07-07 DIAGNOSIS — I2489 Other forms of acute ischemic heart disease: Secondary | ICD-10-CM | POA: Diagnosis present

## 2024-07-07 DIAGNOSIS — Z794 Long term (current) use of insulin: Secondary | ICD-10-CM | POA: Diagnosis not present

## 2024-07-07 DIAGNOSIS — H47619 Cortical blindness, unspecified side of brain: Secondary | ICD-10-CM | POA: Diagnosis present

## 2024-07-07 DIAGNOSIS — D72829 Elevated white blood cell count, unspecified: Secondary | ICD-10-CM | POA: Diagnosis present

## 2024-07-07 DIAGNOSIS — E782 Mixed hyperlipidemia: Secondary | ICD-10-CM | POA: Diagnosis present

## 2024-07-07 DIAGNOSIS — J449 Chronic obstructive pulmonary disease, unspecified: Secondary | ICD-10-CM | POA: Diagnosis present

## 2024-07-07 DIAGNOSIS — I110 Hypertensive heart disease with heart failure: Secondary | ICD-10-CM | POA: Diagnosis present

## 2024-07-07 DIAGNOSIS — I5022 Chronic systolic (congestive) heart failure: Secondary | ICD-10-CM | POA: Diagnosis present

## 2024-07-07 DIAGNOSIS — G4733 Obstructive sleep apnea (adult) (pediatric): Secondary | ICD-10-CM | POA: Diagnosis present

## 2024-07-07 DIAGNOSIS — I69354 Hemiplegia and hemiparesis following cerebral infarction affecting left non-dominant side: Secondary | ICD-10-CM | POA: Diagnosis not present

## 2024-07-07 DIAGNOSIS — Z1152 Encounter for screening for COVID-19: Secondary | ICD-10-CM | POA: Diagnosis not present

## 2024-07-07 DIAGNOSIS — I509 Heart failure, unspecified: Secondary | ICD-10-CM

## 2024-07-07 DIAGNOSIS — I5043 Acute on chronic combined systolic (congestive) and diastolic (congestive) heart failure: Secondary | ICD-10-CM | POA: Diagnosis present

## 2024-07-07 LAB — COMPREHENSIVE METABOLIC PANEL WITH GFR
ALT: 6 U/L (ref 0–44)
AST: 17 U/L (ref 15–41)
Albumin: 4 g/dL (ref 3.5–5.0)
Alkaline Phosphatase: 104 U/L (ref 38–126)
Anion gap: 15 (ref 5–15)
BUN: <5 mg/dL — ABNORMAL LOW (ref 8–23)
CO2: 20 mmol/L — ABNORMAL LOW (ref 22–32)
Calcium: 8.7 mg/dL — ABNORMAL LOW (ref 8.9–10.3)
Chloride: 79 mmol/L — ABNORMAL LOW (ref 98–111)
Creatinine, Ser: 0.87 mg/dL (ref 0.44–1.00)
GFR, Estimated: >60 mL/min (ref >60)
Glucose, Bld: 205 mg/dL — ABNORMAL HIGH (ref 70–99)
Potassium: 3.6 mmol/L (ref 3.5–5.1)
Sodium: 114 mmol/L — CL (ref 135–145)
Total Bilirubin: 0.6 mg/dL (ref 0.0–1.2)
Total Protein: 6.9 g/dL (ref 6.5–8.1)

## 2024-07-07 LAB — BLOOD GAS, VENOUS

## 2024-07-07 LAB — RESP PANEL BY RT-PCR (RSV, FLU A&B, COVID)  RVPGX2
Influenza A by PCR: NEGATIVE
Influenza B by PCR: NEGATIVE
Resp Syncytial Virus by PCR: NEGATIVE
SARS Coronavirus 2 by RT PCR: NEGATIVE

## 2024-07-07 LAB — CBC WITH DIFFERENTIAL/PLATELET
Abs Immature Granulocytes: 0.11 K/uL — ABNORMAL HIGH (ref 0.00–0.07)
Basophils Absolute: 0 K/uL (ref 0.0–0.1)
Basophils Relative: 0 %
Eosinophils Absolute: 0 K/uL (ref 0.0–0.5)
Eosinophils Relative: 0 %
HCT: 38.5 % (ref 36.0–46.0)
Hemoglobin: 12.7 g/dL (ref 12.0–15.0)
Immature Granulocytes: 1 %
Lymphocytes Relative: 9 %
Lymphs Abs: 1.1 K/uL (ref 0.7–4.0)
MCH: 27.2 pg (ref 26.0–34.0)
MCHC: 33 g/dL (ref 30.0–36.0)
MCV: 82.4 fL (ref 80.0–100.0)
Monocytes Absolute: 0.7 K/uL (ref 0.1–1.0)
Monocytes Relative: 6 %
Neutro Abs: 10.2 K/uL — ABNORMAL HIGH (ref 1.7–7.7)
Neutrophils Relative %: 84 %
Platelets: 268 K/uL (ref 150–400)
RBC: 4.67 MIL/uL (ref 3.87–5.11)
RDW: 13.9 % (ref 11.5–15.5)
WBC: 12.2 K/uL — ABNORMAL HIGH (ref 4.0–10.5)
nRBC: 0 % (ref 0.0–0.2)

## 2024-07-07 LAB — OSMOLALITY: Osmolality: 253 mosm/kg — ABNORMAL LOW (ref 275–295)

## 2024-07-07 LAB — SODIUM, URINE, RANDOM: Sodium, Ur: <30 mmol/L

## 2024-07-07 LAB — CBC
HCT: 37.4 % (ref 36.0–46.0)
Hemoglobin: 12.6 g/dL (ref 12.0–15.0)
MCH: 27.2 pg (ref 26.0–34.0)
MCHC: 33.7 g/dL (ref 30.0–36.0)
MCV: 80.8 fL (ref 80.0–100.0)
Platelets: 308 K/uL (ref 150–400)
RBC: 4.63 MIL/uL (ref 3.87–5.11)
RDW: 14 % (ref 11.5–15.5)
WBC: 9 K/uL (ref 4.0–10.5)
nRBC: 0 % (ref 0.0–0.2)

## 2024-07-07 LAB — CBG MONITORING, ED
Glucose-Capillary: 280 mg/dL — ABNORMAL HIGH (ref 70–99)
Glucose-Capillary: 220 mg/dL — ABNORMAL HIGH (ref 70–99)
Glucose-Capillary: 157 mg/dL — ABNORMAL HIGH (ref 70–99)

## 2024-07-07 LAB — PRO BRAIN NATRIURETIC PEPTIDE: Pro Brain Natriuretic Peptide: 4983 pg/mL — ABNORMAL HIGH (ref <300.0)

## 2024-07-07 LAB — OSMOLALITY, URINE: Osmolality, Ur: 81 mosm/kg — ABNORMAL LOW (ref 300–900)

## 2024-07-07 LAB — TROPONIN T, HIGH SENSITIVITY
Troponin T High Sensitivity: 44 ng/L — ABNORMAL HIGH (ref 0–19)
Troponin T High Sensitivity: 56 ng/L — ABNORMAL HIGH (ref 0–19)

## 2024-07-07 LAB — CREATININE, SERUM
Creatinine, Ser: 0.84 mg/dL (ref 0.44–1.00)
GFR, Estimated: >60 mL/min (ref >60)

## 2024-07-07 LAB — LIPASE, BLOOD: Lipase: 24 U/L (ref 11–51)

## 2024-07-07 LAB — PROCALCITONIN: Procalcitonin: <0.1 ng/mL

## 2024-07-07 LAB — SODIUM
Sodium: 118 mmol/L — CL (ref 135–145)
Sodium: 119 mmol/L — CL (ref 135–145)
Sodium: 119 mmol/L — CL (ref 135–145)
Sodium: 124 mmol/L — ABNORMAL LOW (ref 135–145)
Sodium: 125 mmol/L — ABNORMAL LOW (ref 135–145)

## 2024-07-07 MED ORDER — ACETAMINOPHEN 325 MG PO TABS: 650.0000 mg | ORAL_TABLET | Freq: Four times a day (QID) | ORAL | Status: DC | PRN

## 2024-07-07 MED ORDER — LEVOTHYROXINE SODIUM 50 MCG PO TABS
25.0000 ug | ORAL_TABLET | Freq: Every day | ORAL | Status: DC
  Administered 2024-07-08 – 2024-07-12 (×5): 25 ug via ORAL
  Filled 2024-07-07 (×5): qty 1

## 2024-07-07 MED ORDER — POLYETHYLENE GLYCOL 3350 17 G PO PACK
17.0000 g | PACK | Freq: Every day | ORAL | Status: DC | PRN
  Administered 2024-07-09 – 2024-07-11 (×3): 17 g via ORAL
  Filled 2024-07-07 (×3): qty 1

## 2024-07-07 MED ORDER — ENOXAPARIN SODIUM 40 MG/0.4ML IJ SOSY
40.0000 mg | PREFILLED_SYRINGE | INTRAMUSCULAR | Status: DC
  Administered 2024-07-07 – 2024-07-11 (×5): 40 mg via SUBCUTANEOUS
  Filled 2024-07-07 (×5): qty 0.4

## 2024-07-07 MED ORDER — ACETAMINOPHEN 650 MG RE SUPP: 650.0000 mg | Freq: Four times a day (QID) | RECTAL | Status: DC | PRN

## 2024-07-07 MED ORDER — INSULIN ASPART 100 UNIT/ML IJ SOLN: 0.0000 [IU] | Freq: Every day | INTRAMUSCULAR | Status: DC

## 2024-07-07 MED ORDER — PANTOPRAZOLE SODIUM 40 MG PO TBEC
40.0000 mg | DELAYED_RELEASE_TABLET | Freq: Two times a day (BID) | ORAL | Status: DC
  Administered 2024-07-07 – 2024-07-12 (×10): 40 mg via ORAL
  Filled 2024-07-07 (×10): qty 1

## 2024-07-07 MED ORDER — SODIUM CHLORIDE 3 % IV SOLN
INTRAVENOUS | Status: DC
  Filled 2024-07-07: qty 500

## 2024-07-07 MED ORDER — FUROSEMIDE 10 MG/ML IJ SOLN
40.0000 mg | Freq: Two times a day (BID) | INTRAMUSCULAR | Status: DC
  Administered 2024-07-07 – 2024-07-11 (×8): 40 mg via INTRAVENOUS
  Filled 2024-07-07 (×8): qty 4

## 2024-07-07 MED ORDER — FLUTICASONE PROPIONATE 50 MCG/ACT NA SUSP
1.0000 | Freq: Two times a day (BID) | NASAL | Status: DC
  Administered 2024-07-07 – 2024-07-12 (×9): 1 via NASAL
  Filled 2024-07-07 (×3): qty 16

## 2024-07-07 MED ORDER — LOSARTAN POTASSIUM 50 MG PO TABS
100.0000 mg | ORAL_TABLET | Freq: Every day | ORAL | Status: DC
  Administered 2024-07-07: 100 mg via ORAL
  Filled 2024-07-07: qty 2

## 2024-07-07 MED ORDER — OXYCODONE HCL 5 MG PO TABS
5.0000 mg | ORAL_TABLET | ORAL | Status: DC | PRN
  Administered 2024-07-08 – 2024-07-09 (×2): 5 mg via ORAL
  Filled 2024-07-07 (×2): qty 1

## 2024-07-07 MED ORDER — METHYLPREDNISOLONE SODIUM SUCC 125 MG IJ SOLR
125.0000 mg | Freq: Once | INTRAMUSCULAR | Status: AC
  Administered 2024-07-07: 125 mg via INTRAVENOUS
  Filled 2024-07-07: qty 2

## 2024-07-07 MED ORDER — INSULIN ASPART 100 UNIT/ML IJ SOLN
0.0000 [IU] | Freq: Three times a day (TID) | INTRAMUSCULAR | Status: DC
  Administered 2024-07-07: 3 [IU] via SUBCUTANEOUS
  Administered 2024-07-07: 5 [IU] via SUBCUTANEOUS
  Administered 2024-07-08: 1 [IU] via SUBCUTANEOUS
  Filled 2024-07-07: qty 1
  Filled 2024-07-07: qty 3
  Filled 2024-07-07: qty 5

## 2024-07-07 MED ORDER — SENNOSIDES-DOCUSATE SODIUM 8.6-50 MG PO TABS
2.0000 | ORAL_TABLET | Freq: Every day | ORAL | Status: DC
  Administered 2024-07-07 – 2024-07-11 (×5): 2 via ORAL
  Filled 2024-07-07 (×5): qty 2

## 2024-07-07 MED ORDER — DIAZEPAM 5 MG PO TABS
10.0000 mg | ORAL_TABLET | Freq: Four times a day (QID) | ORAL | Status: DC
  Administered 2024-07-07 – 2024-07-12 (×19): 10 mg via ORAL
  Filled 2024-07-07 (×20): qty 2

## 2024-07-07 MED ORDER — ASPIRIN 81 MG PO TBEC
81.0000 mg | DELAYED_RELEASE_TABLET | Freq: Every day | ORAL | Status: DC
  Administered 2024-07-08 – 2024-07-12 (×5): 81 mg via ORAL
  Filled 2024-07-07 (×5): qty 1

## 2024-07-07 MED ORDER — OLANZAPINE 5 MG PO TABS
5.0000 mg | ORAL_TABLET | Freq: Every day | ORAL | Status: DC
  Filled 2024-07-07: qty 1

## 2024-07-07 MED ORDER — FUROSEMIDE 10 MG/ML IJ SOLN
80.0000 mg | Freq: Once | INTRAMUSCULAR | Status: AC
  Administered 2024-07-07: 80 mg via INTRAVENOUS
  Filled 2024-07-07: qty 8

## 2024-07-07 MED ORDER — SPIRONOLACTONE 12.5 MG HALF TABLET
12.5000 mg | ORAL_TABLET | Freq: Every day | ORAL | Status: DC
  Administered 2024-07-07 – 2024-07-09 (×3): 12.5 mg via ORAL
  Filled 2024-07-07 (×4): qty 1

## 2024-07-07 MED ORDER — ROSUVASTATIN CALCIUM 5 MG PO TABS
5.0000 mg | ORAL_TABLET | ORAL | Status: DC
  Administered 2024-07-08 – 2024-07-12 (×3): 5 mg via ORAL
  Filled 2024-07-07 (×4): qty 1

## 2024-07-07 MED ORDER — RANOLAZINE ER 500 MG PO TB12
500.0000 mg | ORAL_TABLET | Freq: Two times a day (BID) | ORAL | Status: AC
  Administered 2024-07-07 – 2024-07-12 (×11): 500 mg via ORAL
  Filled 2024-07-07 (×11): qty 1

## 2024-07-07 MED ORDER — ASPIRIN 81 MG PO CHEW
324.0000 mg | CHEWABLE_TABLET | Freq: Once | ORAL | Status: AC
  Administered 2024-07-07: 324 mg via ORAL
  Filled 2024-07-07: qty 4

## 2024-07-07 MED ORDER — SODIUM CHLORIDE 3 % IV SOLN: INTRAVENOUS | Status: DC

## 2024-07-07 MED ORDER — ONDANSETRON HCL 4 MG PO TABS: 4.0000 mg | ORAL_TABLET | Freq: Four times a day (QID) | ORAL | Status: DC | PRN

## 2024-07-07 MED ORDER — ENSURE PLUS HIGH PROTEIN PO LIQD
237.0000 mL | Freq: Two times a day (BID) | ORAL | Status: DC
  Administered 2024-07-08 – 2024-07-12 (×9): 237 mL via ORAL

## 2024-07-07 MED ORDER — ONDANSETRON HCL 4 MG/2ML IJ SOLN
4.0000 mg | Freq: Four times a day (QID) | INTRAMUSCULAR | Status: DC | PRN
  Administered 2024-07-09: 4 mg via INTRAVENOUS
  Filled 2024-07-07: qty 2

## 2024-07-07 MED ORDER — ISOSORBIDE MONONITRATE ER 60 MG PO TB24
60.0000 mg | ORAL_TABLET | Freq: Every day | ORAL | Status: DC
  Administered 2024-07-07 – 2024-07-12 (×6): 60 mg via ORAL
  Filled 2024-07-07 (×6): qty 1

## 2024-07-07 MED ORDER — IPRATROPIUM-ALBUTEROL 0.5-2.5 (3) MG/3ML IN SOLN
9.0000 mL | Freq: Once | RESPIRATORY_TRACT | Status: AC
  Administered 2024-07-07: 9 mL via RESPIRATORY_TRACT
  Filled 2024-07-07: qty 6

## 2024-07-07 NOTE — ED Notes (Addendum)
 MD verbal order to stop 3% hypertonic saline once Na+ level at 122

## 2024-07-07 NOTE — Progress Notes (Signed)
 PHARMACY CONSULT NOTE - Hypertonic Saline  Pharmacy Consult for Hypertonic Saline Monitoring and Management  Recent Labs: Potassium (mmol/L)  Date Value  07/07/2024 3.6   Magnesium  (mg/dL)  Date Value  88/93/7974 1.8   Calcium  (mg/dL)  Date Value  87/91/7974 8.7 (L)   Albumin (g/dL)  Date Value  87/91/7974 4.0   Sodium  Date Value  07/07/2024 119 mmol/L (LL)  03/03/2024 136 (A)    Assessment  Victoria Medina is a 63 y.o. female presenting with SOB associated with productive cough. PMH significant for CHF, provoked DVT/PE no longer on anticoagulation, CAD, blindness, self-reported COPD. Pharmacy has been consulted to monitor hypertonic saline (3%) infusion.  Pertinent medications: Lasix  80 mg IV x 1  Baseline Labs: Serum Na 114 (on presentation), Serum Osm 253 (12/8 @ 0609), Urine Na IP, Urine Osm collected  Goal of Therapy:  Increase in Na by 4-6 mEq/L in 4-6 hours Do not exceed increase in Na by 10-12 mEq/L in 24 hours  Monitoring:  Date Time Na Rate/Comment  12/8 0609 118 HTS not started yet 12/8 0721 119 HTS not started yet; per MAR infusion started at 0727  Plan:  Continue 3% saline at 15 mL/hr Check Na Q4H  Stop infusion if  Na increases > 4 mEq/L in first 2 hours Na increases > 6 mEq/L in first 4 hours Na increases > 6 mEq/L in 6 hours  Na increases > 8 mEq/L in 8 hours (give D5W bolus) Continue to monitor for signs of clinical improvement and recommendations per nephrology  Lum VEAR Mania PharmD Clinical Pharmacist 07/07/24 8:34 AM

## 2024-07-07 NOTE — Progress Notes (Signed)
 [facility]  Brief Progress Note   _____________________________________________________________________________________________________________  Patient Name: Panda Crossin Patient DOB: [birth year] Date: @TODAY @      Data: Reviewed labs, vital signs, and notes.    Action: No action required at this time.    Response:    _____________________________________________________________________________________________________________  [facility] RN Expeditor [place] Satre S [place] Pettengill Please contact us  directly via secure chat (search for [facility]) or by calling us  at [phone] [facility]).

## 2024-07-07 NOTE — ED Notes (Signed)
 Pt cleaned up after an episode of bowel incontinence. Peri care performed and barrier cream applied. New brief and bed pads applied. Bed sheet and blankets changed.

## 2024-07-07 NOTE — Consult Note (Signed)
 [facility] Consult Note:07/07/24     Date of Admission:  07/07/2024           Reason for Consult:  Hyponatremia   Referring Provider: Roann Gouty, MD Primary Care Provider: [name], DO   History of Presenting Illness:  Victoria Medina is a 63 y.o. female with problems of chronic heart failure with reduced ejection fraction, coronary artery disease, history of MI with stents, hypertension, history of stroke with residual weakness, hypothyroidism, morbid obesity, blindness, chronic suprapubic catheter.  Nephrology consulted for hyponatremia.  Patient's sister is in the room with her and providing some of the information.  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  She can use up about 5-6 pitchers of ice daily.  Her usual sodium level is between 131-135.  Sodium was 135 on June 16, 2024.  She presented to the emergency room from [facility] via EMS due to difficulty breathing and chest pain. Evaluation in the ER showed sodium of 114.  BUN less than 5, creatinine 0.84.  She was placed on low-dose of 3% hypertonic saline at 15 cc/h.  She was also given IV furosemide . As per cardiology notes, she has an LVEF of 20 to 25% with severely decreased LV function.  She has global hypokinesis and moderate asymmetric left ventricular hypertrophy with grade 2 diastolic dysfunction.   Review of Systems: ROS Gen: Denies any fevers or chills HEENT: Patient is blind CV: No chest pain or shortness of breath Resp: No cough or sputum production GI: No nausea, vomiting or diarrhea.  No blood in the stool GU : Has a suprapubic catheter no hematuria.  No previous history of kidney problems MS: Ambulatory.  Denies any acute joint pain or swelling Derm:   No complaints Psych: No complaints Heme: No complaints Neuro: Bedridden due to previous stroke Endocrine: No complaints   Past Medical History:  Diagnosis Date   Acute ST elevation myocardial infarction (STEMI) of  inferolateral wall (HCC) 01/25/2020   Blind    Cervical intraepithelial glandular neoplasia 02/07/2011   Cholecystitis 04/22/2021   DVT (deep venous thrombosis) (HCC) 04/03/2012   History of migraine headaches    History of pulmonary embolism 04/02/2012   September 2013 after knee injury- hyperextension- . U/S of lower extremity revealed DVT in L leg. CTA revealed bilateral lower lobe segmental PEs and R upper lobe segmental PE. Patient was started on lovenox  and transferred upstairs. Cardiac enzymes were negative x1. NOAC for 7 months.         History of stroke 10/09/2021   Hyperlipemia    Myocardial infarction [facility])    NSTEMI (non-ST elevated myocardial infarction) (HCC) 10/27/2013   Formatting of this note might be different from the original.  01/08/2014: LHC: 2nd Marginal: 99% (pre) to 0% (post); STENT, PROMUS PREMIER MR 2.25X12MM     12/04/2013: LHC: Distal LAD: 70% (pre) to 0% (post) CATH, MINI TREK RX 2.0X20MM STENT, PROMUS PREMIER MR 2.25X28MM; Mid LAD: 70% (pre) to 0% (post), STENT, PROMUS PREMIER MR 2.50X12MM     10/27/2013. LHC: 3 vessel Disease, Stent x2:  Proximal LAD:   Stroke (HCC)     Social History   Tobacco Use   Smoking status: Never   Smokeless tobacco: Never  Vaping Use   Vaping status: Never Used  Substance Use Topics   Alcohol use: Yes    Comment: occasional   Drug use: No    Family History  Problem Relation Age of Onset   Heart  attack Mother    Hypertension Mother    Hypercholesterolemia Mother    Diabetes Mother    Stroke Father    Heart attack Father    Hypertension Father    Hypercholesterolemia Father    Diabetes Father    Diabetes Maternal Grandmother    Cancer - Other Maternal Grandmother      OBJECTIVE: Blood pressure 115/74, pulse 86, temperature 98.5 F (36.9 C), temperature source Oral, resp. rate 18, height 5' 1.5 (1.562 m), weight 83.3 kg, SpO2 100%.  Physical Exam Physical Exam: General:  No acute distress, laying in the bed   HEENT  anicteric, dry oral mucous membrane  Pulm/lungs  normal breathing effort, [place] O2  CVS/Heart  regular rhythm, no rub or gallop  Abdomen:   Soft, nontender  Extremities:  No peripheral edema  Neurologic:  Alert, oriented, able to follow commands  Skin:  No acute rashes  Foley catheter in place  Lab Results Lab Results  Component Value Date   WBC 9.0 07/07/2024   HGB 12.6 07/07/2024   HCT 37.4 07/07/2024   MCV 80.8 07/07/2024   PLT 308 07/07/2024    Lab Results  Component Value Date   CREATININE 0.84 07/07/2024   BUN <5 (L) 07/07/2024   NA 119 (LL) 07/07/2024   K 3.6 07/07/2024   CL 79 (L) 07/07/2024   CO2 20 (L) 07/07/2024    Lab Results  Component Value Date   ALT 6 07/07/2024   AST 17 07/07/2024   ALKPHOS 104 07/07/2024   BILITOT 0.6 07/07/2024     Microbiology: Recent Results (from the past 240 hours)  Resp panel by RT-PCR (RSV, Flu A&B, Covid) Anterior Nasal Swab     Status: None   Collection Time: 07/07/24  3:40 AM   Specimen: Anterior Nasal Swab  Result Value Ref Range Status   SARS Coronavirus 2 by RT PCR NEGATIVE NEGATIVE Final    Comment: (NOTE) SARS-CoV-2 target nucleic acids are NOT DETECTED.  The SARS-CoV-2 RNA is generally detectable in upper respiratory specimens during the acute phase of infection. The lowest concentration of SARS-CoV-2 viral copies this assay can detect is 138 copies/mL. A negative result does not preclude SARS-Cov-2 infection and should not be used as the sole basis for treatment or other patient management decisions. A negative result may occur with  improper specimen collection/handling, submission of specimen other than nasopharyngeal swab, presence of viral mutation(s) within the areas targeted by this assay, and inadequate number of viral copies(<138 copies/mL). A negative result must be combined with clinical observations, patient history, and epidemiological information. The expected result is Negative.  Fact  Sheet for Patients:  bloggercourse.com  Fact Sheet for Healthcare Providers:  seriousbroker.it  This test is no t yet approved or cleared by the [place]  FDA and  has been authorized for detection and/or diagnosis of SARS-CoV-2 by FDA under an Emergency Use Authorization (EUA). This EUA will remain  in effect (meaning this test can be used) for the duration of the COVID-19 declaration under Section 564(b)(1) of the Act, 21 U.S.C.section 360bbb-3(b)(1), unless the authorization is terminated  or revoked sooner.       Influenza A by PCR NEGATIVE NEGATIVE Final   Influenza B by PCR NEGATIVE NEGATIVE Final    Comment: (NOTE) The Xpert Xpress SARS-CoV-2/FLU/RSV plus assay is intended as an aid in the diagnosis of influenza from Nasopharyngeal swab specimens and should not be used as a sole basis for treatment. Nasal washings and aspirates are  unacceptable for Xpert Xpress SARS-CoV-2/FLU/RSV testing.  Fact Sheet for Patients: bloggercourse.com  Fact Sheet for Healthcare Providers: seriousbroker.it  This test is not yet approved or cleared by the [place]  FDA and has been authorized for detection and/or diagnosis of SARS-CoV-2 by FDA under an Emergency Use Authorization (EUA). This EUA will remain in effect (meaning this test can be used) for the duration of the COVID-19 declaration under Section 564(b)(1) of the Act, 21 U.S.C. section 360bbb-3(b)(1), unless the authorization is terminated or revoked.     Resp Syncytial Virus by PCR NEGATIVE NEGATIVE Final    Comment: (NOTE) Fact Sheet for Patients: bloggercourse.com  Fact Sheet for Healthcare Providers: seriousbroker.it  This test is not yet approved or cleared by the [place]  FDA and has been authorized for detection and/or diagnosis of SARS-CoV-2 by FDA under an  Emergency Use Authorization (EUA). This EUA will remain in effect (meaning this test can be used) for the duration of the COVID-19 declaration under Section 564(b)(1) of the Act, 21 U.S.C. section 360bbb-3(b)(1), unless the authorization is terminated or revoked.  Performed at [facility], [address] [place], [place] [address]     Medications: Scheduled Meds:  [START ON 07/08/2024] aspirin  EC  81 mg Oral Daily   diazepam   10 mg Oral QID   enoxaparin  (LOVENOX ) injection  40 mg Subcutaneous Q24H   furosemide   40 mg Intravenous BID   insulin  aspart  0-5 Units Subcutaneous QHS   insulin  aspart  0-9 Units Subcutaneous TID WC   isosorbide  mononitrate  60 mg Oral Daily   [START ON 07/08/2024] levothyroxine   25 mcg Oral Q0600   losartan   100 mg Oral Daily   pantoprazole   40 mg Oral BID AC   ranolazine   500 mg Oral BID   [START ON 07/08/2024] rosuvastatin   5 mg Oral Once per day on Tuesday Thursday Saturday   senna-docusate  2 tablet Oral QHS   spironolactone   12.5 mg Oral Daily   Continuous Infusions:  sodium chloride  (hypertonic) 15 mL/hr at 07/07/24 0727   PRN Meds:.acetaminophen  **OR** acetaminophen , ondansetron  **OR** ondansetron  (ZOFRAN ) IV, oxyCODONE , polyethylene glycol  Allergies  Allergen Reactions   Levaquin [Levofloxacin In D5w] Anaphylaxis and Swelling   Iodinated Contrast Media Itching    Severe itching   Other     Dust mites and opiates (all of them)   Latex Dermatitis    Urinalysis: No results for input(s): COLORURINE, LABSPEC, PHURINE, GLUCOSEU, HGBUR, BILIRUBINUR, KETONESUR, PROTEINUR, UROBILINOGEN, NITRITE, LEUKOCYTESUR in the last 72 hours.  Invalid input(s): APPERANCEUR    Imaging: DG Chest Port 1 View Result Date: 07/07/2024 EXAM: 1 VIEW(S) XRAY OF THE CHEST 07/07/2024 03:59:00 AM COMPARISON: 06/12/2024 CLINICAL HISTORY: shob cp FINDINGS: LUNGS AND PLEURA: Stable diffuse mild interstitial pulmonary edema. No pleural  effusion. No pneumothorax. HEART AND MEDIASTINUM: Mild cardiomegaly. Aortic atherosclerosis. BONES AND SOFT TISSUES: No acute osseous abnormality. IMPRESSION: 1. Stable diffuse mild interstitial pulmonary edema. 2. Mild cardiomegaly. Electronically signed by: Dorethia Molt MD 07/07/2024 04:08 AM EST RP Workstation: [id number]      Assessment/Plan:  Carneshia Raker is a 63 y.o. female with medical problems of  Coronary artery disease, history of stroke, morbid obesity, congestive heart failure, hypothyroidism, blindness and chronic suprapubic catheter    was admitted on 07/07/2024 for :  Hyponatremia [E87.1]  Hyponatremia likely due to excessive consumption of water in the setting of chronic systolic CHF. Patient has responded well to IV furosemide  She is currently on 3% hypertonic saline at low-dose  15 mL/h.  Recommendations: We discussed with the patient regarding following strict fluid restriction and cutting back on eating ice.  Fluid restriction to about 1200 cc/day.  (Including ice) Low BUN suggest low protein intake.  Suggested to patient to try protein shakes.  She used to drink fruit smoothies at [facility] but for some reason has stopped doing that lately. Continue to monitor sodium closely at least every 4 hours while on 3% sodium chloride  drip. Goal for correction about 8 mEq in 24 hours.  (Sodium 122 by tomorrow morning)  Shaundra Fullam Dennise 07/07/24

## 2024-07-07 NOTE — Consult Note (Signed)
 [facility] CLINIC CARDIOLOGY CONSULT NOTE       Patient ID: Victoria Medina MRN: [id number] DOB/AGE: 1961-04-02 63 y.o.  Admit date: 07/07/2024 Referring Physician Dr. Nena Rebel Primary Physician [name], DO  Primary Cardiologist Dr. Wilburn Reason for Consultation AoCHF  HPI: Victoria Medina is a 63 y.o. female  with a past medical history of chronic HFrEF, coronary artery disease s/p multiple stents, hypertension, history of stroke with flaccid hemiaplasia, hypothyroidism, morbid obesity, blindness, chronic suprapubic catheter  who presented to the ED on 07/07/2024 for SOB. Cardiology was consulted for further evaluation.   Patient presented for evaluation of SOB and cough for the last few days. Workup in the ED notable for creatinine 0.87, potassium 3.6, sodium 114, hemoglobin 12.7, WBC 12.2. Troponins 44 > 56, BNP 4983. EKG in the ED NSR rate 88 bpm. CXR with pulmonary edema. Started on IV lasix  and 3% hypertonic saline in the ED.  At the time of my evaluation, she is laying flat in hospital bed on supplemental O2. States SOB worsening for 2-3 days prior to admission. Does not wear O2 at home. Denies any dizziness, palpitations, syncope. States that she does have some chest tightness which is associated with the SOB and coughing. Endorses good UOP with IV lasix  thus far. Has been tolerating her medications well at home without any issues.    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 U/S of lower extremity revealed DVT in L leg. CTA revealed bilateral lower  lobe segmental PEs and R upper lobe segmental PE. Patient was started on lovenox  and transferred upstairs. Cardiac enzymes were negative x1. NOAC for 7 months.         History of stroke 10/09/2021   Hyperlipemia    Myocardial infarction [facility])    NSTEMI (non-ST elevated myocardial infarction) (HCC) 10/27/2013   Formatting of this note might be different from the original.  01/08/2014: LHC: 2nd Marginal: 99% (pre) to 0% (post); STENT, PROMUS PREMIER MR 2.25X12MM     12/04/2013: LHC: Distal LAD: 70% (pre) to 0% (post) CATH, MINI TREK RX 2.0X20MM STENT, PROMUS PREMIER MR 2.25X28MM; Mid LAD: 70% (pre) to 0% (post), STENT, PROMUS PREMIER MR 2.50X12MM     10/27/2013. LHC: 3 vessel Disease, Stent x2:  Proximal LAD:   Stroke [facility])     Past Surgical History:  Procedure Laterality Date   ABDOMINAL HYSTERECTOMY     BACK SURGERY     CARDIAC CATHETERIZATION     CORONARY/GRAFT ACUTE MI REVASCULARIZATION N/A 01/25/2020   Procedure: Coronary/Graft Acute MI Revascularization;  Surgeon: [name], MD;  Location: [facility] INVASIVE CV LAB;  Service: Cardiovascular;  Laterality: N/A;   FRACTURE SURGERY     IR CATHETER TUBE CHANGE  08/10/2021   LEFT HEART CATH AND CORONARY ANGIOGRAPHY N/A 01/25/2020   Procedure: LEFT HEART CATH AND CORONARY ANGIOGRAPHY;  Surgeon: [name], MD;  Location: [facility] INVASIVE CV LAB;  Service: Cardiovascular;  Laterality: N/A;   SKIN GRAFT Right    TOTAL ABDOMINAL HYSTERECTOMY Bilateral     (Not in a hospital admission)  Social  History   Socioeconomic History   Marital status: Single    Spouse name: Not on file   Number of children: Not on file   Years of education: Not on file   Highest education level: Not on file  Occupational History   Not on file  Tobacco Use   Smoking status: Never   Smokeless tobacco: Never  Vaping Use   Vaping status: Never Used  Substance and Sexual Activity   Alcohol use: Yes    Comment: occasional   Drug use: No   Sexual activity: Not  Currently  Other Topics Concern   Not on file  Social History Narrative   Not on file   Social Drivers of Health   Financial Resource Strain: Not on file  Food Insecurity: No Food Insecurity (06/13/2024)   Hunger Vital Sign    Worried About Running Out of Food in the Last Year: Never true    Ran Out of Food in the Last Year: Never true  Transportation Needs: No Transportation Needs (06/13/2024)   PRAPARE - Administrator, Civil Service (Medical): No    Lack of Transportation (Non-Medical): No  Physical Activity: Not on file  Stress: Not on file  Social Connections: Not on file  Intimate Partner Violence: Not At Risk (06/13/2024)   Humiliation, Afraid, Rape, and Kick questionnaire    Fear of Current or Ex-Partner: No    Emotionally Abused: No    Physically Abused: No    Sexually Abused: No    Family History  Problem Relation Age of Onset   Heart attack Mother    Hypertension Mother    Hypercholesterolemia Mother    Diabetes Mother    Stroke Father    Heart attack Father    Hypertension Father    Hypercholesterolemia Father    Diabetes Father    Diabetes Maternal Grandmother    Cancer - Other Maternal Grandmother      Vitals:   07/07/24 0726 07/07/24 0800 07/07/24 0830 07/07/24 0900  BP: 109/78 108/76 115/80 122/86  Pulse: 75 72 81 82  Resp: 20 18 18    Temp: (!) 97.5 F (36.4 C)     TempSrc: Axillary     SpO2: 100% 100% 100% 100%  Weight:      Height:        PHYSICAL EXAM General: Chronically ill appearing female, well nourished, in no acute distress. HEENT: Normocephalic and atraumatic. Neck: No JVD.  Lungs: Normal respiratory effort on 4L [place]. Bibasilar crackles Heart: HRRR. Normal S1 and S2 without gallops or murmurs.  Abdomen: Non-distended appearing.  Msk: Normal strength and tone for age. Extremities: Warm and well perfused. No clubbing, cyanosis. No edema.  Neuro: Alert and oriented X 3. Psych: Answers questions appropriately.    Labs: Basic Metabolic Panel: Recent Labs    07/07/24 0340 07/07/24 0609 07/07/24 0721  NA 114* 118* 119*  K 3.6  --   --   CL 79*  --   --   CO2 20*  --   --   GLUCOSE 205*  --   --   BUN <5*  --   --   CREATININE 0.87  --   --   CALCIUM  8.7*  --   --    Liver Function Tests: Recent Labs    07/07/24 0340  AST 17  ALT 6  ALKPHOS 104  BILITOT 0.6  PROT 6.9  ALBUMIN 4.0   Recent Labs    07/07/24 0340  LIPASE 24   CBC: Recent Labs    07/07/24 0340  WBC 12.2*  NEUTROABS 10.2*  HGB 12.7  HCT 38.5  MCV 82.4  PLT 268   Cardiac Enzymes: No results for input(s): CKTOTAL, CKMB, CKMBINDEX, TROPONINIHS in the last 72 hours. BNP: No results for input(s): BNP in the last 72 hours. D-Dimer: No results for input(s): DDIMER in the last 72 hours. Hemoglobin A1C: No results for input(s): HGBA1C in the last 72 hours. Fasting Lipid Panel: No results for input(s): CHOL, HDL, LDLCALC, TRIG, CHOLHDL, LDLDIRECT in the last 72 hours. Thyroid Function Tests: No results for input(s): TSH, T4TOTAL, T3FREE, THYROIDAB in the last 72 hours.  Invalid input(s): FREET3 Anemia Panel: No results for input(s): VITAMINB12, FOLATE, FERRITIN, TIBC, IRON, RETICCTPCT in the last 72 hours.   Radiology: [facility] Chest Port 1 View Result Date: 07/07/2024 EXAM: 1 VIEW(S) XRAY OF THE CHEST 07/07/2024 03:59:00 AM COMPARISON: 06/12/2024 CLINICAL HISTORY: shob cp FINDINGS: LUNGS AND PLEURA: Stable diffuse mild interstitial pulmonary edema. No pleural effusion. No pneumothorax. HEART AND MEDIASTINUM: Mild cardiomegaly. Aortic atherosclerosis. BONES AND SOFT TISSUES: No acute osseous abnormality. IMPRESSION: 1. Stable diffuse mild interstitial pulmonary edema. 2. Mild cardiomegaly. Electronically signed by: Dorethia Molt MD 07/07/2024 04:08 AM EST RP Workstation: [id number]   DG Chest Port 1 View Result Date: 06/12/2024 CLINICAL DATA:  Cough EXAM: PORTABLE  CHEST 1 VIEW COMPARISON:  06/05/2024 FINDINGS: Cardiac shadow is stable. Aortic calcifications are noted. Diffuse interstitial changes are seen consistent with edema. No effusion is seen. No focal infiltrate is noted. Postsurgical changes in the cervical spine are seen. IMPRESSION: Interstitial edema. Electronically Signed   By: Oneil Devonshire M.D.   On: 06/12/2024 02:06    ECHO 05/2024: 1. Left ventricular ejection fraction, by estimation, is 20 to 25%. The left ventricle has severely decreased function. The left ventricle demonstrates global hypokinesis. The left ventricular internal cavity size was moderately to severely dilated. There is moderate asymmetric left ventricular hypertrophy of the inferior and basal segments. Left ventricular diastolic parameters are consistent with Grade III diastolic dysfunction (restrictive).   2. Right ventricular systolic function is mildly reduced. The right ventricular size is mildly enlarged.   3. The mitral valve is grossly normal. Mild mitral valve regurgitation.   4. The aortic valve is normal in structure. Aortic valve regurgitation is not visualized.   TELEMETRY (personally reviewed): sinus rhythm rate 70s  EKG (personally reviewed): NSR rate 88 bpm  Data reviewed by me 07/07/2024: last 24h vitals tele labs imaging I/O ED provider note, admission H&P  Principal Problem:   Hyponatremia Active Problems:   Essential hypertension   Acquired hypothyroidism   GERD (gastroesophageal reflux disease)   Mixed hyperlipidemia   Suprapubic catheter (HCC)   Chronic systolic CHF (congestive heart failure) (HCC) - LVEF 20-25%   Major depressive disorder, recurrent, moderate (HCC)   DNR (do not resuscitate)   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)    ASSESSMENT AND PLAN:  Victoria Medina is a 63 y.o. female  with a past medical history of chronic HFrEF, coronary artery disease s/p multiple stents, hypertension,  history of stroke with flaccid hemiaplasia, hypothyroidism, morbid obesity, blindness, chronic suprapubic catheter  who presented to the ED on 07/07/2024 for SOB. Cardiology was consulted for further evaluation.   # Acute on chronic HFrEF # Coronary artery disease # Demand ischemia # Hypertension # Hyponatremia # Hx stroke, hemiplasia  Patient with worsening SOB, BNP elevated at 4900.  Started on IV lasix  in the ED as well as 3% hypertonic saline. Echo last admission with EF 20-25%, global hypokinesis, moderate to severe LV dilation, grade III diastolic dysfunction.  -Continue IV lasix  40 mg BID. Continue home losartan  100 mg daily, spironolactone  12.5 mg daily. Consider increased dose of spironolactone  tomorrow.  -Continue imdur  60 mg daily and ranexa  500 mg BID.  -Continue crestor  5 mg daily and aspirin  81 mg daily.  -Minimal and flat troponin most consistent with demand/supply mismatch and not ACS in the setting of acute heart failure exacerbation. No plan for further cardiac diagnostics.  -Nephrology following, appreciate recommendations.    This patient's plan of care was discussed and created with Dr. Ammon and he is in agreement.  Signed: Danita Bloch, PA-C  07/07/2024, 10:36 AM [facility] Cardiology

## 2024-07-07 NOTE — ED Notes (Signed)
 Pt placed on 4L [place] and consulted with RT

## 2024-07-07 NOTE — ED Notes (Signed)
 Please call sister, Marta Bihari upon discharge from admission floor.

## 2024-07-07 NOTE — Progress Notes (Signed)
 Heart Failure Stewardship Pharmacy Note  PCP: [name], DO PCP-Cardiologist: Deatrice Cage, MD  HPI: Victoria Medina is a 63 y.o. female with CHF, CVA with left-sided hemiplegia-wheelchair-bound, provoked DVT/PE no longer on anticoagulation, OSA, CAD, blindness, self-reported COPD  who presented with acute worsening of chronic shortness of breath. On admission, proBNP was 4983, HS-troponin was 44, and sodium was 114. Chest x-ray noted stable diffuse mild pulmonary edema.   Pertinent cardiac history: NSTEMI in 09/2013 with PCI of LAD/RCA. At that time, LVEF was 35%. In 11/2013 she underwent PCI of mid to distal LAD. STEMI in 12/2013 with PCI OM1. In 2021, presented with NSTEMI, LHC showing three-vessel disease and underwent PCI of mid left circumflex into OM. LVEF 30 to 35% on cath. TTE 05/2024 with severe LV dilatation and reduced LVEF of 20 to 25% with global hypokinesis, mild MR, G3DD.  Pertinent Lab Values: Creatinine, Ser  Date Value Ref Range Status  07/07/2024 0.87 0.44 - 1.00 mg/dL Final   BUN  Date Value Ref Range Status  07/07/2024 <5 (L) 8 - 23 mg/dL Final  91/95/7974 6 4 - 21 Final   Potassium  Date Value Ref Range Status  07/07/2024 3.6 3.5 - 5.1 mmol/L Final   Sodium  Date Value Ref Range Status  07/07/2024 119 (LL) 135 - 145 mmol/L Final    Comment:    Critical Value, Read Back and verified with BURNARD POUCH RN [phone] 07/07/24 HNM Performed at [facility], [address] [place], [place] [address]   03/03/2024 136 (A) 137 - 147 Final   B Natriuretic Peptide  Date Value Ref Range Status  06/05/2024 473.9 (H) 0.0 - 100.0 pg/mL Final    Comment:    Performed at [facility], [address], [place], [place] [address]   Magnesium   Date Value Ref Range Status  06/05/2024 1.8 1.7 - 2.4 mg/dL Final    Comment:    Performed at [facility], [address] [place], [place] [address]   Hemoglobin A1C  Date Value Ref Range Status   11/29/2023 6.4  Final   Hgb A1c MFr Bld  Date Value Ref Range Status  06/12/2024 6.1 (H) 4.8 - 5.6 % Final    Comment:    (NOTE) Diagnosis of Diabetes The following HbA1c ranges recommended by the American Diabetes Association (ADA) may be used as an aid in the diagnosis of diabetes mellitus.  Hemoglobin             Suggested A1C NGSP%              Diagnosis  <5.7                   Non Diabetic  5.7-6.4                Pre-Diabetic  >6.4                   Diabetic  <7.0                   Glycemic control for                       adults with diabetes.     TSH  Date Value Ref Range Status  06/04/2023 2.44 0.41 - 5.90 Final  09/13/2022 1.988 0.350 - 4.500 uIU/mL Final    Comment:    Performed by a 3rd Generation assay with a functional sensitivity of <=0.01  uIU/mL. Performed at [facility], [address] [place], [place] [address]     Vital Signs:  Temp:  [97.5 F (36.4 C)-98.2 F (36.8 C)] 97.5 F (36.4 C) (12/08 0726) Pulse Rate:  [72-87] 72 (12/08 0800) Resp:  [18-20] 18 (12/08 0800) BP: (108-119)/(75-78) 108/76 (12/08 0800) SpO2:  [97 %-100 %] 100 % (12/08 0800) FiO2 (%):  [60 %] 60 % (12/08 0341) Weight:  [83.3 kg (183 lb 9.6 oz)] 83.3 kg (183 lb 9.6 oz) (12/08 0330)  Intake/Output Summary (Last 24 hours) at 07/07/2024 0913 Last data filed at 07/07/2024 0848 Gross per 24 hour  Intake --  Output 750 ml  Net -750 ml    Current Heart Failure Medications:  Loop diuretic: furosemide  40 mg IV BID Beta-Blocker: none ACEI/ARB/ARNI: losartan  100 mg daily MRA: spironolactone  12.5 mg daily SGLT2i: none Other: none  Prior to admission Heart Failure Medications:  Loop diuretic: furosemide  40 mg daily Beta-Blocker: none ACEI/ARB/ARNI: losartan  100 mg daily MRA: spironolactone  25 mg daily SGLT2i: none Other: none  Assessment: 1. 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 NYHA class III  symptoms.  -Symptoms: Reports shortness of breath is mildly improved. Denies LEE. Reports appetite is fair. Denies orthopnea. -Volume: Hypervolemic. Currently on furosemide  40 mg IV BID. I/Os may not be accurate. Continue current dose for now. -Hemodynamics: BP WNL. HR 70-80s. Will observe BP response to home meds prior to recommending changes. -BB: Can consider adding BB prior to discharge when close to euvolemic. -ACEI/ARB/ARNI: Continue current losartan  100 mg daily -MRA: Spironolactone  can be associated with hyponatremia. It was been resumed at lower dose. It may require discontinuation if blood sodium is resistant to 3% saline. -SGLT2i: Not a good candidate due to chronic catheter and hyponatremia during this admission.  Plan: 1) Medication changes recommended at this time: - None  2) Patient assistance: -Pending  3) Education: -- Patient has been educated on current HF medications and potential additions to HF medication regimen - Patient verbalizes understanding that over the next few months, these medication doses may change and more medications may be added to optimize HF regimen - Patient has been educated on basic disease state pathophysiology and goals of therapy  Please do not hesitate to reach out with questions or concerns,  Kaiesha Tonner, PharmD, CPP, BCPS, [facility] Heart Failure Pharmacist  Phone - [phone] 07/07/2024 12:07 PM

## 2024-07-07 NOTE — H&P (Addendum)
 History and Physical    Victoria Medina FMW:[id number] DOB: 16-Jul-1961 DOA: 07/07/2024  DOS: the patient was seen and examined on 07/07/2024  PCP: [name], DO   Patient coming from: SNF  I have personally briefly reviewed patient's old medical records in [facility]  Chief Complaint: Shortness of breath and chest pain  HPI: Victoria Medina is a pleasant 63 y.o. female with medical history significant for HFrEF, CAD, stroke, bilateral blindness, anxiety/depression, hypothyroidism, morbid obesity, history of DVT and PE no longer on anticoagulation, chronic suprapubic catheter who was recently admitted and discharged from [facility] between 06/12/2024 and 06/16/2024 after being treated for acute hypoxemic respiratory failure due to acute exacerbation of reduced ejection fraction congestive heart failure.  Prior to that patient was admitted for aspiration pneumonia and CHF exacerbation.  Patient came to the hospital from [facility] facility via EMS for difficulty breathing and chest pain.  EMS reports that patient was diaphoretic upon arrival.  EMS gave DuoNebs in the ambulance.  Audible wheezing present upon arrival. Patient was on BiPAP.  Patient is stated that she has been having shortness of breath worsening over the last 2 days.  She she stated that she has not been feeling well since she was discharged from the hospital. ED Course: Upon arrival to the ED, patient is found to be in severe hyponatremia at 114, proBNP of 4983, WBC 12.2, COVID flu RSV negative, chest x-ray showed interstitial edema. EKG showed sinus rhythm at 88 bpm.  Patient was hypoxemic and requiring BiPAP.  Hospitalist service was consulted for evaluation for admission for acute hypoxemic respiratory failure likely due to CHF exacerbation and severe hyponatremia at 114. Review of Systems:  ROS  All other systems negative except as noted in the HPI.  Past Medical History:  Diagnosis Date   Acute ST elevation myocardial  infarction (STEMI) of inferolateral wall (HCC) 01/25/2020   Blind    Cervical intraepithelial glandular neoplasia 02/07/2011   Cholecystitis 04/22/2021   DVT (deep venous thrombosis) (HCC) 04/03/2012   History of migraine headaches    History of pulmonary embolism 04/02/2012   September 2013 after knee injury- hyperextension- . U/S of lower extremity revealed DVT in L leg. CTA revealed bilateral lower lobe segmental PEs and R upper lobe segmental PE. Patient was started on lovenox  and transferred upstairs. Cardiac enzymes were negative x1. NOAC for 7 months.         History of stroke 10/09/2021   Hyperlipemia    Myocardial infarction [facility])    NSTEMI (non-ST elevated myocardial infarction) (HCC) 10/27/2013   Formatting of this note might be different from the original.  01/08/2014: LHC: 2nd Marginal: 99% (pre) to 0% (post); STENT, PROMUS PREMIER MR 2.25X12MM     12/04/2013: LHC: Distal LAD: 70% (pre) to 0% (post) CATH, MINI TREK RX 2.0X20MM STENT, PROMUS PREMIER MR 2.25X28MM; Mid LAD: 70% (pre) to 0% (post), STENT, PROMUS PREMIER MR 2.50X12MM     10/27/2013. LHC: 3 vessel Disease, Stent x2:  Proximal LAD:   Stroke [facility])     Past Surgical History:  Procedure Laterality Date   ABDOMINAL HYSTERECTOMY     BACK SURGERY     CARDIAC CATHETERIZATION     CORONARY/GRAFT ACUTE MI REVASCULARIZATION N/A 01/25/2020   Procedure: Coronary/Graft Acute MI Revascularization;  Surgeon: [name], MD;  Location: [facility] INVASIVE CV LAB;  Service: Cardiovascular;  Laterality: N/A;   FRACTURE SURGERY     IR CATHETER TUBE CHANGE  08/10/2021   LEFT HEART CATH  AND CORONARY ANGIOGRAPHY N/A 01/25/2020   Procedure: LEFT HEART CATH AND CORONARY ANGIOGRAPHY;  Surgeon: [name], MD;  Location: [facility] INVASIVE CV LAB;  Service: Cardiovascular;  Laterality: N/A;   SKIN GRAFT Right    TOTAL ABDOMINAL HYSTERECTOMY Bilateral      reports that she has never smoked. She has never used smokeless tobacco. She reports  current alcohol use. She reports that she does not use drugs.  Allergies  Allergen Reactions   Levaquin [Levofloxacin In D5w] Anaphylaxis and Swelling   Iodinated Contrast Media Itching    Severe itching   Other     Dust mites and opiates (all of them)   Latex Dermatitis    Family History  Problem Relation Age of Onset   Heart attack Mother    Hypertension Mother    Hypercholesterolemia Mother    Diabetes Mother    Stroke Father    Heart attack Father    Hypertension Father    Hypercholesterolemia Father    Diabetes Father    Diabetes Maternal Grandmother    Cancer - Other Maternal Grandmother     Prior to Admission medications   Medication Sig Start Date End Date Taking? Authorizing Provider  aspirin  EC 81 MG tablet Take 81 mg by mouth daily.    [provider]  Cholecalciferol (VITAMIN D3) 1.25 MG (50000 UT) CAPS Take 1 capsule by mouth once a week.  every Mon for Supplement    [provider]  Dextromethorphan-Benzocaine (COUGH/SORE THROAT LOZENGES MT) Use as directed 1 drop in the mouth or throat every 4 (four) hours as needed.    [provider]  diazepam  (VALIUM ) 10 MG tablet Take 1 tablet (10 mg total) by mouth 4 (four) times daily. 06/17/24 07/17/24  [name], DO  fluticasone  (FLONASE ) 50 MCG/ACT nasal spray Place 1 spray into both nostrils 2 (two) times daily.    [provider]  furosemide  (LASIX ) 40 MG tablet Take 1 tablet (40 mg total) by mouth daily. 06/17/24 07/17/24  Trudy Anthony HERO, MD  ibuprofen  (ADVIL ) 800 MG tablet Take 800 mg by mouth 2 (two) times daily as needed for mild pain (pain score 1-3) or moderate pain (pain score 4-6).    [provider]  isosorbide  mononitrate (IMDUR ) 60 MG 24 hr tablet Take 60 mg by mouth daily.    [provider]  lansoprazole (PREVACID) 15 MG capsule Take 15 mg by mouth 2 (two) times daily before a meal.    [provider]  levothyroxine  (SYNTHROID ) 25 MCG  tablet Take 25 mcg by mouth daily before breakfast.    [provider]  losartan  (COZAAR ) 100 MG tablet Take 100 mg by mouth daily.    [provider]  magnesium  hydroxide (MILK OF MAGNESIA) 400 MG/5ML suspension Take 5 mLs by mouth every 6 (six) hours as needed for mild constipation.    [provider]  nitroGLYCERIN  (NITROSTAT ) 0.4 MG SL tablet Place 0.4 mg under the tongue every 5 (five) minutes x 3 doses as needed for chest pain.     [provider]  OLANZapine  (ZYPREXA ) 5 MG tablet Take 5 mg by mouth daily.    [provider]  ondansetron  (ZOFRAN -ODT) 4 MG disintegrating tablet Take 4 mg by mouth every 4 (four) hours as needed for nausea or vomiting.    [provider]  polyethylene glycol (MIRALAX  / GLYCOLAX ) 17 g packet Take 17 g by mouth daily.    [provider]  ranolazine  (  RANEXA ) 500 MG 12 hr tablet Take 500 mg by mouth 2 (two) times daily.    [provider]  rosuvastatin  (CRESTOR ) 5 MG tablet Take 5 mg by mouth daily. Every Tuesday,Thursday,Sat    [provider]  senna-docusate (SENOKOT-S) 8.6-50 MG tablet Take 2 tablets by mouth at bedtime.    [provider]  spironolactone  (ALDACTONE ) 25 MG tablet Take 0.5 tablets (12.5 mg total) by mouth daily. 06/16/24 07/16/24  Trudy Anthony HERO, MD  Vibegron  (GEMTESA ) 75 MG TABS Take 75 mg by mouth daily. 03/08/22   Maurine Lukes, PA-C    Physical Exam: Vitals:   07/07/24 0726 07/07/24 0800 07/07/24 0830 07/07/24 0900  BP: 109/78 108/76 115/80 122/86  Pulse: 75 72 81 82  Resp: 20 18 18    Temp: (!) 97.5 F (36.4 C)     TempSrc: Axillary     SpO2: 100% 100% 100% 100%  Weight:      Height:        Physical Exam   Constitutional: Alert, awake, moderate respiratory distress due to shortness of breath HEENT: Neck supple Respiratory: Clear to auscultation B/L, no wheezing, no rales.  Cardiovascular: Regular rate and rhythm, no murmurs /  rubs / gallops. No extremity edema. 2+ pedal pulses. No carotid bruits.  Abdomen: Soft, no tenderness, Bowel sounds positive.  Musculoskeletal: Left-sided paralysis Neurologic: CN 2-12 grossly intact.  Left hemiplegia Psychiatric: Alert and oriented x 3. Normal mood.    Labs on Admission: I have personally reviewed following labs and imaging studies  CBC: Recent Labs  Lab 07/07/24 0340  WBC 12.2*  NEUTROABS 10.2*  HGB 12.7  HCT 38.5  MCV 82.4  PLT 268   Basic Metabolic Panel: Recent Labs  Lab 07/07/24 0340 07/07/24 0609 07/07/24 0721  NA 114* 118* 119*  K 3.6  --   --   CL 79*  --   --   CO2 20*  --   --   GLUCOSE 205*  --   --   BUN <5*  --   --   CREATININE 0.87  --   --   CALCIUM  8.7*  --   --    GFR: Estimated Creatinine Clearance: 66.4 mL/min (by C-G formula based on SCr of 0.87 mg/dL). Liver Function Tests: Recent Labs  Lab 07/07/24 0340  AST 17  ALT 6  ALKPHOS 104  BILITOT 0.6  PROT 6.9  ALBUMIN 4.0   Recent Labs  Lab 07/07/24 0340  LIPASE 24   No results for input(s): AMMONIA in the last 168 hours. Coagulation Profile: No results for input(s): INR, PROTIME in the last 168 hours. Cardiac Enzymes: No results for input(s): CKTOTAL, CKMB, CKMBINDEX, TROPONINI, TROPONINIHS in the last 168 hours. BNP (last 3 results) Recent Labs    06/05/24 1034  BNP 473.9*   HbA1C: No results for input(s): HGBA1C in the last 72 hours. CBG: No results for input(s): GLUCAP in the last 168 hours. Lipid Profile: No results for input(s): CHOL, HDL, LDLCALC, TRIG, CHOLHDL, LDLDIRECT in the last 72 hours. Thyroid Function Tests: No results for input(s): TSH, T4TOTAL, FREET4, T3FREE, THYROIDAB in the last 72 hours. Anemia Panel: No results for input(s): VITAMINB12, FOLATE, FERRITIN, TIBC, IRON, RETICCTPCT in the last 72 hours. Urine analysis:    Component Value Date/Time   COLORURINE COLORLESS (A) 11/11/2023  1721   APPEARANCEUR CLEAR (A) 11/11/2023 1721   APPEARANCEUR Hazy (A) 09/05/2022 1519   LABSPEC 1.001 (L) 11/11/2023 1721   LABSPEC 1.026 08/05/2014 1906  PHURINE 8.0 11/11/2023 1721   GLUCOSEU NEGATIVE 11/11/2023 1721   GLUCOSEU NEGATIVE 08/05/2014 1906   HGBUR SMALL (A) 11/11/2023 1721   BILIRUBINUR NEGATIVE 11/11/2023 1721   BILIRUBINUR Negative 09/05/2022 1519   BILIRUBINUR NEGATIVE 08/05/2014 1906   KETONESUR NEGATIVE 11/11/2023 1721   PROTEINUR NEGATIVE 11/11/2023 1721   NITRITE NEGATIVE 11/11/2023 1721   LEUKOCYTESUR MODERATE (A) 11/11/2023 1721   LEUKOCYTESUR 2+ 08/05/2014 1906    Radiological Exams on Admission: I have personally reviewed images DG Chest Port 1 View Result Date: 07/07/2024 EXAM: 1 VIEW(S) XRAY OF THE CHEST 07/07/2024 03:59:00 AM COMPARISON: 06/12/2024 CLINICAL HISTORY: shob cp FINDINGS: LUNGS AND PLEURA: Stable diffuse mild interstitial pulmonary edema. No pleural effusion. No pneumothorax. HEART AND MEDIASTINUM: Mild cardiomegaly. Aortic atherosclerosis. BONES AND SOFT TISSUES: No acute osseous abnormality. IMPRESSION: 1. Stable diffuse mild interstitial pulmonary edema. 2. Mild cardiomegaly. Electronically signed by: Dorethia Molt MD 07/07/2024 04:08 AM EST RP Workstation: [id number]    EKG: My personal interpretation of EKG shows: Sinus rhythm, no ST elevation    Assessment/Plan Principal Problem:   Hyponatremia Active Problems:   Essential hypertension   Acquired hypothyroidism   GERD (gastroesophageal reflux disease)   DNR (do not resuscitate)   Mixed hyperlipidemia   Suprapubic catheter (HCC)   Chronic systolic CHF (congestive heart failure) (HCC) - LVEF 20-25%   Major depressive disorder, recurrent, moderate (HCC)   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)    Assessment and Plan: 63 year old female with history of chronic HFrEF, history of aspiration pneumonia, CAD s/p multiple stents, HTN,  history of stroke with left hemiaplasia, bilateral blindness, hypothyroidism, morbid obesity, chronic suprapubic catheter who presented to ED complaining of respiratory distress and found to have fluid overload and hyponatremia.  1.  Acute on chronic severe hyponatremia - It appears that she has a recurrent hyponatremia - She was started on 3%  saline in the emergency room - Initial sodium was 114.  Now repeat sodium is 119. - Will continue to monitor sodium level every 4 hours.  Will correct sodium not more than 10 mEq in 24 hours. - She does not have any seizure - Monitor for any stepdown unit.  2.  Acute hypoxemic respiratory failure - Likely due to fluid overload. - Continue diuretics, BiPAP, oxygen to maintain saturation more than 90%  3.  Acute exacerbation of congestive heart failure - Continue diuretics Lasix  40 mg IV twice daily, spironolactone  p.o. - Continue BiPAP - She will be placed on congestive heart failure pathway - Continue oxygen to maintain saturation more than 90% - Cardiology consult to manage congestive heart failure  4.  Mild leukocytosis 12 - Does not have other signs of infection. - May be reactive due to shortness of breath and CHF exacerbation - Will hold off antibiotics at this point  5. type 2 diabetes - Poorly controlled - Continue insulin  sliding scale  6.  HLD/hypothyroidism/GERD - Resume home medications  7.  Chronic urinary retention with suprapubic catheter - Stable - Continue  8.  History of stroke with left-sided hemiplegia, blindness - Continue statin and aspirin   9.  CAD s/p stent - Continue Imdur , Ranexa , losartan , statin  10.  HTN - Stable - Continue Imdur , losartan  - Continue to monitor blood pressure  11.  Obesity, BMI 35 - May work with her for weight reduction       DVT prophylaxis: Lovenox  Code Status: DNR/DNI(Do NOT Intubate) Family Communication: None  Disposition Plan: Back  to SNF  Consults called:  Cardiology  Admission status: Inpatient, Step Down Unit   Nena Rebel, MD [facility] 07/07/2024, 10:23 AM

## 2024-07-07 NOTE — ED Triage Notes (Signed)
 Pt arrived from [facility] facility via [facility] d/t difficulty breathing and chest pain. EMS reports pt was diaphoretic upon arrival. EMS gave duoneb in ambulance. Audible wheezing present upon arrival.

## 2024-07-07 NOTE — Progress Notes (Signed)
 MEDICATION RELATED CONSULT NOTE - INITIAL   Pharmacy Consult for 3% NaCl  Indication:  acute hyponatremia (asymptomatic)   Allergies  Allergen Reactions   Levaquin [Levofloxacin In D5w] Anaphylaxis and Swelling   Iodinated Contrast Media Itching    Severe itching   Other     Dust mites and opiates (all of them)   Latex Dermatitis    Patient Measurements: Height: 5' 1.5 (156.2 cm) Weight: 83.3 kg (183 lb 9.6 oz) IBW/kg (Calculated) : 48.95 Adjusted Body Weight:   Vital Signs: Temp: 98.2 F (36.8 C) (12/08 0328) BP: 119/75 (12/08 0328) Pulse Rate: 83 (12/08 0341) Intake/Output from previous day: No intake/output data recorded. Intake/Output from this shift: No intake/output data recorded.  Labs: Recent Labs    07/07/24 0340  WBC 12.2*  HGB 12.7  HCT 38.5  PLT 268  CREATININE 0.87  ALBUMIN 4.0  PROT 6.9  AST 17  ALT 6  ALKPHOS 104  BILITOT 0.6   Estimated Creatinine Clearance: 66.4 mL/min (by C-G formula based on SCr of 0.87 mg/dL).   Microbiology: Recent Results (from the past 720 hours)  Resp panel by RT-PCR (RSV, Flu A&B, Covid) Anterior Nasal Swab     Status: None   Collection Time: 06/12/24  1:36 AM   Specimen: Anterior Nasal Swab  Result Value Ref Range Status   SARS Coronavirus 2 by RT PCR NEGATIVE NEGATIVE Final    Comment: (NOTE) SARS-CoV-2 target nucleic acids are NOT DETECTED.  The SARS-CoV-2 RNA is generally detectable in upper respiratory specimens during the acute phase of infection. The lowest concentration of SARS-CoV-2 viral copies this assay can detect is 138 copies/mL. A negative result does not preclude SARS-Cov-2 infection and should not be used as the sole basis for treatment or other patient management decisions. A negative result may occur with  improper specimen collection/handling, submission of specimen other than nasopharyngeal swab, presence of viral mutation(s) within the areas targeted by this assay, and inadequate  number of viral copies(<138 copies/mL). A negative result must be combined with clinical observations, patient history, and epidemiological information. The expected result is Negative.  Fact Sheet for Patients:  bloggercourse.com  Fact Sheet for Healthcare Providers:  seriousbroker.it  This test is no t yet approved or cleared by the [place]  FDA and  has been authorized for detection and/or diagnosis of SARS-CoV-2 by FDA under an Emergency Use Authorization (EUA). This EUA will remain  in effect (meaning this test can be used) for the duration of the COVID-19 declaration under Section 564(b)(1) of the Act, 21 U.S.C.section 360bbb-3(b)(1), unless the authorization is terminated  or revoked sooner.       Influenza A by PCR NEGATIVE NEGATIVE Final   Influenza B by PCR NEGATIVE NEGATIVE Final    Comment: (NOTE) The Xpert Xpress SARS-CoV-2/FLU/RSV plus assay is intended as an aid in the diagnosis of influenza from Nasopharyngeal swab specimens and should not be used as a sole basis for treatment. Nasal washings and aspirates are unacceptable for Xpert Xpress SARS-CoV-2/FLU/RSV testing.  Fact Sheet for Patients: bloggercourse.com  Fact Sheet for Healthcare Providers: seriousbroker.it  This test is not yet approved or cleared by the [place]  FDA and has been authorized for detection and/or diagnosis of SARS-CoV-2 by FDA under an Emergency Use Authorization (EUA). This EUA will remain in effect (meaning this test can be used) for the duration of the COVID-19 declaration under Section 564(b)(1) of the Act, 21 U.S.C. section 360bbb-3(b)(1), unless the authorization is terminated or revoked.  Resp Syncytial Virus by PCR NEGATIVE NEGATIVE Final    Comment: (NOTE) Fact Sheet for Patients: bloggercourse.com  Fact Sheet for Healthcare  Providers: seriousbroker.it  This test is not yet approved or cleared by the [place]  FDA and has been authorized for detection and/or diagnosis of SARS-CoV-2 by FDA under an Emergency Use Authorization (EUA). This EUA will remain in effect (meaning this test can be used) for the duration of the COVID-19 declaration under Section 564(b)(1) of the Act, 21 U.S.C. section 360bbb-3(b)(1), unless the authorization is terminated or revoked.  Performed at [facility], [address] [place] [place], [place] [address]     Medical History: Past Medical History:  Diagnosis Date   Acute ST elevation myocardial infarction (STEMI) of inferolateral wall (HCC) 01/25/2020   Blind    Cervical intraepithelial glandular neoplasia 02/07/2011   Cholecystitis 04/22/2021   DVT (deep venous thrombosis) (HCC) 04/03/2012   History of migraine headaches    History of pulmonary embolism 04/02/2012   September 2013 after knee injury- hyperextension- . U/S of lower extremity revealed DVT in L leg. CTA revealed bilateral lower lobe segmental PEs and R upper lobe segmental PE. Patient was started on lovenox  and transferred upstairs. Cardiac enzymes were negative x1. NOAC for 7 months.         History of stroke 10/09/2021   Hyperlipemia    Myocardial infarction [facility])    NSTEMI (non-ST elevated myocardial infarction) (HCC) 10/27/2013   Formatting of this note might be different from the original.  01/08/2014: LHC: 2nd Marginal: 99% (pre) to 0% (post); STENT, PROMUS PREMIER MR 2.25X12MM     12/04/2013: LHC: Distal LAD: 70% (pre) to 0% (post) CATH, MINI TREK RX 2.0X20MM STENT, PROMUS PREMIER MR 2.25X28MM; Mid LAD: 70% (pre) to 0% (post), STENT, PROMUS PREMIER MR 2.50X12MM     10/27/2013. LHC: 3 vessel Disease, Stent x2:  Proximal LAD:   Stroke [facility])     Medications:    Assessment: 12/8:  Na @ 0340 = 114 - 3% NaCl to start at 15 ml/hr @ ~ 0600   Goal of Therapy:  - No rise in Na >  4 mEq in first 2 hrs - No rise in Na > 6 mEq in 4 hours - No rise in Na > 12 mEq in 24 hours  - Na WNL   Plan:  - Will check Na Q2H X 2 then Q4H  - notify provider if Na increase exceeds above guidelines   Abimbola Aki D 07/07/2024,5:55 AM

## 2024-07-07 NOTE — ED Notes (Signed)
 IV in L hand infiltrated while flushing saline after lasix  administration. Pharmacist notified and stated no antidote for lasix  and to aspirate and apply a warm compress.

## 2024-07-07 NOTE — ED Notes (Addendum)
 This RN spoke with pharmacist about scheduled valium  at 2200. Pharmacist stated okay to give med late around 0100 on 07/08/24 to keep med close to schedule after last dose was given late.

## 2024-07-07 NOTE — Progress Notes (Signed)
 Heart Failure Navigator Progress Note Assessed for Heart & Vascular TOC clinic readiness.  Does not meet criteria due to current [facility] patient.  Navigator will sign off at this time.  Charmaine Pines, RN, BSN [facility] Heart Failure Navigator Secure Chat Only

## 2024-07-07 NOTE — Hospital Course (Signed)
 bilateral blindness, CAD, stroke, anxiety/depression, hypothyroidism, morbid obesity, heart failure with reduced EF, chronic suprapubic catheter r with repeated hospitalizations for acute hypoxic respiratory failure mostly due to CHF exacerbation and most recently 11/13 to 11/17 for CHF exacerbation with aspiration pneumonia due to aspiration pneumonia who presents from her facility and mild CHF exacerbation who is now being admitted to the hospital with recurrent acute hypoxic respiratory failure due to acute on chronic heart failure with reduced EF

## 2024-07-07 NOTE — ED Provider Notes (Addendum)
 [facility] Provider Note    Event Date/Time   First MD Initiated Contact with Patient 07/07/24 0330     (approximate)   History   Shortness of Breath   HPI  Victoria Medina is a 63 y.o. female   Past medical history of CHF, provoked DVT/PE no longer on anticoagulation, CAD, blindness, self-reported COPD, here with shortness of breath.  Worse in the last couple days associated with productive cough.  No fever.  +Chest tightness.  EMS gave DuoNeb with not much improvement.   Independent Historian contributed to assessment above: EMS gives report as above  External Medical Documents Reviewed: Prior hospitalization notes for aspiration pneumonia and CHF exacerbation      Physical Exam   Triage Vital Signs: ED Triage Vitals  Encounter Vitals Group     BP 07/07/24 0328 119/75     Girls Systolic BP Percentile --      Girls Diastolic BP Percentile --      Boys Systolic BP Percentile --      Boys Diastolic BP Percentile --      Pulse Rate 07/07/24 0328 87     Resp 07/07/24 0328 19     Temp 07/07/24 0328 98.2 F (36.8 C)     Temp src --      SpO2 07/07/24 0322 100 %     Weight 07/07/24 0330 183 lb 9.6 oz (83.3 kg)     Height 07/07/24 0330 5' 1.5 (1.562 m)     Head Circumference --      Peak Flow --      Pain Score 07/07/24 0330 0     Pain Loc --      Pain Education --      Exclude from Growth Chart --     Most recent vital signs: Vitals:   07/07/24 0328 07/07/24 0341  BP: 119/75   Pulse: 87 83  Resp: 19   Temp: 98.2 F (36.8 C)   SpO2: 97% 100%    General: Awake, no distress.  Mentation sharp answering questions appropriately. CV:  Good peripheral perfusion.  Resp:  Normal effort.  Increased work of breathing.  Distant lung sounds, rales at bases. Abd:  No distention.  Other:  Bedside ultrasound shows global hypokinesis, plethoric IVC with minimal respiratory variation and diffuse B-lines in all lung fields.   ED Results /  Procedures / Treatments   Labs (all labs ordered are listed, but only abnormal results are displayed) Labs Reviewed  COMPREHENSIVE METABOLIC PANEL WITH GFR - Abnormal; Notable for the following components:      Result Value   Sodium 114 (*)    Chloride 79 (*)    CO2 20 (*)    Glucose, Bld 205 (*)    BUN <5 (*)    Calcium  8.7 (*)    All other components within normal limits  PRO BRAIN NATRIURETIC PEPTIDE - Abnormal; Notable for the following components:   Pro Brain Natriuretic Peptide 4,983.0 (*)    All other components within normal limits  CBC WITH DIFFERENTIAL/PLATELET - Abnormal; Notable for the following components:   WBC 12.2 (*)    Neutro Abs 10.2 (*)    Abs Immature Granulocytes 0.11 (*)    All other components within normal limits  BLOOD GAS, VENOUS - Abnormal; Notable for the following components:   pO2, Ven <31 (*)    Acid-Base Excess 2.2 (*)    All other components within normal limits  TROPONIN T, HIGH  SENSITIVITY - Abnormal; Notable for the following components:   Troponin T High Sensitivity 44 (*)    All other components within normal limits  RESP PANEL BY RT-PCR (RSV, FLU A&B, COVID)  [id number]  LIPASE, BLOOD  PROCALCITONIN  OSMOLALITY  TROPONIN T, HIGH SENSITIVITY     I ordered and reviewed the above labs they are notable for leukocytosis 12.2 and VBG shows no hypercapnia, normal pH on BiPAP.  Corrected sodium 116.  BNP near 5000.  Troponin 44.  EKG  ED ECG REPORT I, Ginnie Shams, the attending physician, personally viewed and interpreted this ECG.   Date: 07/07/2024  EKG Time: 0326  Rate: 88  Rhythm: sinus  Axis: nl  Intervals:nl  ST&T Change: no stemi    RADIOLOGY I independently reviewed and interpreted CXR and see interstitial edema I also reviewed radiologist's formal read.   PROCEDURES:  Critical Care performed: Yes, see critical care procedure note(s)  .Critical Care  Performed by: Shams Ginnie, MD Authorized by: Shams Ginnie, MD    Critical care provider statement:    Critical care time (minutes):  40   Critical care was time spent personally by me on the following activities:  Development of treatment plan with patient or surrogate, discussions with consultants, evaluation of patient's response to treatment, examination of patient, ordering and review of laboratory studies, ordering and review of radiographic studies, ordering and performing treatments and interventions, pulse oximetry, re-evaluation of patient's condition and review of old charts    MEDICATIONS ORDERED IN ED: Medications  aspirin  chewable tablet 324 mg (has no administration in time range)  ipratropium-albuterol  (DUONEB) 0.5-2.5 (3) MG/3ML nebulizer solution 9 mL (9 mLs Nebulization Given 07/07/24 0347)  methylPREDNISolone  sodium succinate  (SOLU-MEDROL ) 125 mg/2 mL injection 125 mg (125 mg Intravenous Given 07/07/24 0413)  furosemide  (LASIX ) injection 80 mg (80 mg Intravenous Given 07/07/24 0414)    External physician / consultants:  I spoke with hospital medicine for admission and regarding care plan for this patient.   IMPRESSION / MDM / ASSESSMENT AND PLAN / ED COURSE  I reviewed the triage vital signs and the nursing notes.                                Patient's presentation is most consistent with acute presentation with potential threat to life or bodily function.  Differential diagnosis includes, but is not limited to, CHF exacerbation, pulmonary edema, respiratory infection, acute hypoxemic respiratory failure     The patient is on the cardiac monitor to evaluate for evidence of arrhythmia and/or significant heart rate changes.  MDM:    Patient with history of recent aspiration pneumonia and CHF exacerbation pulmonary edema leading to hospitalizations.  I think similar presentation today with her productive cough and evidence of fluid overload on clinical exam and bedside ultrasound.  Given her increased work of breathing placed on  BiPAP with improvement.    No discernible focality noted on chest x-ray, defer antibiotics at this time and focus on her CHF exacerbation with Lasix , BiPAP.  She has a reported productive cough, small leukocytosis, will add on a procalcitonin for now.  She self-reported a COPD history but I see no documented history of the same.  There was some scant wheezing on auscultation and given her respiratory distress on presentation I gave DuoNebs, Solu-Medrol .  Her sodium was quite low at 116.  Mentation is good however answering questions appropriately.  No seizure.  Hypervolemic, I will defer 3%, defer additional fluids, and instead proceed with diuresis as I think that this may help the sodium levels.  Check serum osm  Admission.         FINAL CLINICAL IMPRESSION(S) / ED DIAGNOSES   Final diagnoses:  SOB (shortness of breath)  Acute on chronic congestive heart failure, unspecified heart failure type (HCC)  Hyponatremia     Rx / DC Orders   ED Discharge Orders     None        Note:  This document was prepared using Dragon voice recognition software and may include unintentional dictation errors.    Cyrena Mylar, MD 07/07/24 [phone]    Cyrena Mylar, MD 07/07/24 9544    Cyrena Mylar, MD 07/07/24 [phone]

## 2024-07-08 LAB — COMPREHENSIVE METABOLIC PANEL WITH GFR
ALT: 7 U/L (ref 0–44)
AST: 13 U/L — ABNORMAL LOW (ref 15–41)
Albumin: 4.2 g/dL (ref 3.5–5.0)
Alkaline Phosphatase: 104 U/L (ref 38–126)
Anion gap: 14 (ref 5–15)
BUN: 7 mg/dL — ABNORMAL LOW (ref 8–23)
CO2: 28 mmol/L (ref 22–32)
Calcium: 9.2 mg/dL (ref 8.9–10.3)
Chloride: 87 mmol/L — ABNORMAL LOW (ref 98–111)
Creatinine, Ser: 0.85 mg/dL (ref 0.44–1.00)
GFR, Estimated: >60 mL/min (ref >60)
Glucose, Bld: 141 mg/dL — ABNORMAL HIGH (ref 70–99)
Potassium: 4.2 mmol/L (ref 3.5–5.1)
Sodium: 128 mmol/L — ABNORMAL LOW (ref 135–145)
Total Bilirubin: 0.5 mg/dL (ref 0.0–1.2)
Total Protein: 7 g/dL (ref 6.5–8.1)

## 2024-07-08 LAB — BLOOD GAS, VENOUS
Bicarbonate: 27.8 mmol/L — AB (ref 20.0–28.0)
Delivery systems: POSITIVE
FIO2: 60 %
O2 Saturation: 36.6 % — AB (ref 0.0–2.0)
Patient temperature: 37
Patient temperature: 37 %
pCO2, Ven: 46 mmHg (ref 44–60)
pH, Ven: 7.39 (ref 7.25–7.43)
pO2, Ven: <31 mmHg — AB (ref 32–45)

## 2024-07-08 LAB — CBC
HCT: 37.4 % (ref 36.0–46.0)
Hemoglobin: 12.6 g/dL (ref 12.0–15.0)
MCH: 27.6 pg (ref 26.0–34.0)
MCHC: 33.7 g/dL (ref 30.0–36.0)
MCV: 82 fL (ref 80.0–100.0)
Platelets: 392 K/uL (ref 150–400)
RBC: 4.56 MIL/uL (ref 3.87–5.11)
RDW: 14.4 % (ref 11.5–15.5)
WBC: 14.4 K/uL — ABNORMAL HIGH (ref 4.0–10.5)
nRBC: 0 % (ref 0.0–0.2)

## 2024-07-08 LAB — PROTIME-INR
INR: 1.1 (ref 0.8–1.2)
Prothrombin Time: 15 s (ref 11.4–15.2)

## 2024-07-08 LAB — CBG MONITORING, ED: Glucose-Capillary: 142 mg/dL — ABNORMAL HIGH (ref 70–99)

## 2024-07-08 LAB — TSH: TSH: 0.976 u[IU]/mL (ref 0.350–4.500)

## 2024-07-08 LAB — SODIUM: Sodium: 126 mmol/L — ABNORMAL LOW (ref 135–145)

## 2024-07-08 MED ORDER — OLANZAPINE 5 MG PO TABS
5.0000 mg | ORAL_TABLET | Freq: Every day | ORAL | Status: AC
  Administered 2024-07-08 – 2024-07-12 (×5): 5 mg via ORAL
  Filled 2024-07-08 (×5): qty 1

## 2024-07-08 NOTE — ED Notes (Signed)
 Report given to [place], [place]

## 2024-07-08 NOTE — Progress Notes (Signed)
   Brief Progress Note   _____________________________________________________________________________________________________________  Patient Name: Victoria Medina Patient DOB: 05/20/1961 Date: @TODAY @      Data: Reviewed labs, vital signs, and notes.    Action: No action required at this time.    Response:    _____________________________________________________________________________________________________________  [facility] RN Expeditor Aylee Littrell S Uldine Fuster Please contact us  directly via secure chat (search for [facility]) or by calling us  at [phone] [facility]).

## 2024-07-08 NOTE — ED Notes (Signed)
 Pt stating she thinks she had a BM, pt checked, brief slightly wet but no BM. Pt cleaned, brief changed. Pt repositioned in bed, covered with blankets. Pt sat up to eat lunch, pt after seeing tray stated did not eat meat. Pt asking for different tray. Dietary to be called.

## 2024-07-08 NOTE — ED Notes (Signed)
 Pt resting at this time.

## 2024-07-08 NOTE — ED Notes (Signed)
 MD at bedside.

## 2024-07-08 NOTE — ED Notes (Signed)
 This RN assisted pt with brushing teeth per pt's request.

## 2024-07-08 NOTE — Progress Notes (Signed)
 PROGRESS NOTE    Victoria Medina  FMW:[id number] DOB: [birth year] DOA: 07/07/2024 PCP: Victoria Locus, DO  Outpatient Specialists: cardiology    Brief Narrative:   From admission h and p  Victoria Medina is a pleasant 63 y.o. female with medical history significant for HFrEF, CAD, stroke, bilateral blindness, anxiety/depression, hypothyroidism, morbid obesity, history of DVT and PE no longer on anticoagulation, chronic suprapubic catheter who was recently admitted and discharged from [facility] between 06/12/2024 and 06/16/2024 after being treated for acute hypoxemic respiratory failure due to acute exacerbation of reduced ejection fraction congestive heart failure.  Prior to that patient was admitted for aspiration pneumonia and CHF exacerbation.  Patient came to the hospital from [facility] facility via EMS for difficulty breathing and chest pain.  EMS reports that patient was diaphoretic upon arrival.  EMS gave DuoNebs in the ambulance.  Audible wheezing present upon arrival. Patient was on BiPAP.  Patient is stated that she has been having shortness of breath worsening over the last 2 days.  She she stated that she has not been feeling well since she was discharged from the hospital.  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 Here pro-bnp to 5 k with dyspnea  and pulm edema. Has responded well to diuresis.  - cardiology following, appreciate recs - continue IV lasix  for now - home imdur , spiro - defer home metop to cardiology  # Hyponatremia Severe, 114 on arrival, likely 2/2 chf exacerbation. Has responded to hypertonic saline (discontinued) and iv diuresis. Improved to 128 today - continue lasix  - f/u tsh given hx hypothyroidism - trend  # Acute hypoxic respiratory failure 2/2 chf exacerbation, treated with bipap initially, today weaned to room air - monitor   # Suprapubic catheter status - routine care  # T2DM Appears diet control - monitor daily fastings  # CAD  S/p multiple stents. No chest pain and only mild trop elevation likely demand - cont home asa, ranexa , statin  # History CVA # Cortical Blindness # Hemiplegia Stable - home asa, valium , statin  # Hypothyroid - f/u tsh - cont home synthroid   # Hypertension BPs soft - hold home losartan   # MDD - home olanzapine   # OSA Not on cpap, refuses it  # Obesity noted   DVT prophylaxis: lovenox  Code Status: dnr/dni Family Communication: none at bedside  Level of care: Progressive Status is: Inpatient Remains inpatient appropriate because: severity of illness    Consultants:  Nephrology, cardiology  Procedures: none  Antimicrobials:  none    Subjective: Reports dyspnea resolved, no chest pain  Objective: Vitals:   07/08/24 0515 07/08/24 0700 07/08/24 0800 07/08/24 0830  BP:  103/64 96/67 105/60  Pulse:  73 73 80  Resp:  17 11 15   Temp: 98 F (36.7 C)     TempSrc: Oral     SpO2:  100% 100%  95%  Weight:      Height:        Intake/Output Summary (Last 24 hours) at 07/08/2024 0845 Last data filed at 07/08/2024 0440 Gross per 24 hour  Intake --  Output 2200 ml  Net -2200 ml   Filed Weights   07/07/24 0330  Weight: 83.3 kg    Examination:  General exam: Appears calm and comfortable  Respiratory system: Clear to auscultation.  Respiratory effort normal. Cardiovascular system: S1 & S2 heard, RRR. Distant heart sounds Gastrointestinal system: Abdomen is obese, soft and nontender.   Central nervous system: Alert and oriented. hemiplegic Extremities: decreased muscle tone. Contractures. Trace LE edema Skin: No visible rashes, lesions or ulcers Psychiatry: Judgement and insight appear normal. Mood & affect appropriate.     Data Reviewed: I have personally reviewed following labs and imaging studies  CBC: Recent Labs  Lab 07/07/24 0340 07/07/24 1020 07/08/24 0447  WBC 12.2* 9.0 14.4*  NEUTROABS 10.2*  --   --   HGB 12.7 12.6 12.6  HCT 38.5 37.4 37.4  MCV 82.4 80.8 82.0  PLT 268 308 392   Basic Metabolic Panel: Recent Labs  Lab 07/07/24 0340 07/07/24 0609 07/07/24 0721 07/07/24 1020 07/07/24 1630 07/07/24 2101 07/08/24 0447  NA 114*   < > 119* 119* 124* 125* 128*  K 3.6  --   --   --   --   --  4.2  CL 79*  --   --   --   --   --  87*  CO2 20*  --   --   --   --   --  28  GLUCOSE 205*  --   --   --   --   --  141*  BUN <5*  --   --   --   --   --  7*  CREATININE 0.87  --   --  0.84  --   --  0.85  CALCIUM  8.7*  --   --   --   --   --  9.2   < > = values in this interval not displayed.   GFR: Estimated Creatinine Clearance: 67.9 mL/min (by C-G formula based on SCr of 0.85 mg/dL). Liver Function Tests: Recent Labs  Lab 07/07/24 0340 07/08/24 0447  AST 17 13*  ALT 6 7  ALKPHOS 104 104  BILITOT 0.6 0.5  PROT 6.9 7.0  ALBUMIN 4.0 4.2   Recent Labs  Lab 07/07/24 0340  LIPASE 24   No results for input(s): AMMONIA in the last 168 hours. Coagulation Profile: Recent Labs  Lab 07/08/24 0447  INR 1.1   Cardiac Enzymes: No results for input(s): CKTOTAL, CKMB, CKMBINDEX, TROPONINI in the last 168 hours. BNP (last 3 results) Recent Labs    06/12/24 0300 07/07/24 0340  PROBNP 1,956.0* 4,983.0*   HbA1C: No results for input(s): HGBA1C in the last 72 hours. CBG: Recent  Labs  Lab 07/07/24 1416 07/07/24 1823 07/07/24 2219 07/08/24 0815  GLUCAP 280* 220* 157* 142*   Lipid Profile: No results for input(s): CHOL, HDL, LDLCALC, TRIG, CHOLHDL, LDLDIRECT in the last 72 hours. Thyroid Function Tests: No results for input(s): TSH, T4TOTAL, FREET4, T3FREE, THYROIDAB in the last 72 hours. Anemia Panel: No results for input(s): VITAMINB12, FOLATE, FERRITIN, TIBC, IRON, RETICCTPCT in the last 72 hours. Urine analysis:    Component Value Date/Time   COLORURINE COLORLESS (A) 11/11/2023 1721   APPEARANCEUR CLEAR (A) 11/11/2023 1721   APPEARANCEUR Hazy (  A) 09/05/2022 1519   LABSPEC 1.001 (L) 11/11/2023 1721   LABSPEC 1.026 08/05/2014 1906   PHURINE 8.0 11/11/2023 1721   GLUCOSEU NEGATIVE 11/11/2023 1721   GLUCOSEU NEGATIVE 08/05/2014 1906   HGBUR SMALL (A) 11/11/2023 1721   BILIRUBINUR NEGATIVE 11/11/2023 1721   BILIRUBINUR Negative 09/05/2022 1519   BILIRUBINUR NEGATIVE 08/05/2014 1906   KETONESUR NEGATIVE 11/11/2023 1721   PROTEINUR NEGATIVE 11/11/2023 1721   NITRITE NEGATIVE 11/11/2023 1721   LEUKOCYTESUR MODERATE (A) 11/11/2023 1721   LEUKOCYTESUR 2+ 08/05/2014 1906   Sepsis Labs: @LABRCNTIP (procalcitonin:4,lacticidven:4)  ) Recent Results (from the past 240 hours)  Resp panel by RT-PCR (RSV, Flu A&B, Covid) Anterior Nasal Swab     Status: None   Collection Time: 07/07/24  3:40 AM   Specimen: Anterior Nasal Swab  Result Value Ref Range Status   SARS Coronavirus 2 by RT PCR NEGATIVE NEGATIVE Final    Comment: (NOTE) SARS-CoV-2 target nucleic acids are NOT DETECTED.  The SARS-CoV-2 RNA is generally detectable in upper respiratory specimens during the acute phase of infection. The lowest concentration of SARS-CoV-2 viral copies this assay can detect is 138 copies/mL. A negative result does not preclude SARS-Cov-2 infection and should not be used as the sole basis for treatment or other patient management  decisions. A negative result may occur with  improper specimen collection/handling, submission of specimen other than nasopharyngeal swab, presence of viral mutation(s) within the areas targeted by this assay, and inadequate number of viral copies(<138 copies/mL). A negative result must be combined with clinical observations, patient history, and epidemiological information. The expected result is Negative.  Fact Sheet for Patients:  bloggercourse.com  Fact Sheet for Healthcare Providers:  seriousbroker.it  This test is no t yet approved or cleared by the [place]  FDA and  has been authorized for detection and/or diagnosis of SARS-CoV-2 by FDA under an Emergency Use Authorization (EUA). This EUA will remain  in effect (meaning this test can be used) for the duration of the COVID-19 declaration under Section 564(b)(1) of the Act, 21 U.S.C.section 360bbb-3(b)(1), unless the authorization is terminated  or revoked sooner.       Influenza A by PCR NEGATIVE NEGATIVE Final   Influenza B by PCR NEGATIVE NEGATIVE Final    Comment: (NOTE) The Xpert Xpress SARS-CoV-2/FLU/RSV plus assay is intended as an aid in the diagnosis of influenza from Nasopharyngeal swab specimens and should not be used as a sole basis for treatment. Nasal washings and aspirates are unacceptable for Xpert Xpress SARS-CoV-2/FLU/RSV testing.  Fact Sheet for Patients: bloggercourse.com  Fact Sheet for Healthcare Providers: seriousbroker.it  This test is not yet approved or cleared by the [place]  FDA and has been authorized for detection and/or diagnosis of SARS-CoV-2 by FDA under an Emergency Use Authorization (EUA). This EUA will remain in effect (meaning this test can be used) for the duration of the COVID-19 declaration under Section 564(b)(1) of the Act, 21 U.S.C. section 360bbb-3(b)(1), unless the  authorization is terminated or revoked.     Resp Syncytial Virus by PCR NEGATIVE NEGATIVE Final    Comment: (NOTE) Fact Sheet for Patients: bloggercourse.com  Fact Sheet for Healthcare Providers: seriousbroker.it  This test is not yet approved or cleared by the [place]  FDA and has been authorized for detection and/or diagnosis of SARS-CoV-2 by FDA under an Emergency Use Authorization (EUA). This EUA will remain in effect (meaning this test can be used) for the duration of the COVID-19 declaration under Section 564(b)(1) of the Act, 21  U.S.C. section 360bbb-3(b)(1), unless the authorization is terminated or revoked.  Performed at [facility], [address], [place], [place] [address]          Radiology Studies: DG Chest [facility] 1 View Result Date: 07/07/2024 EXAM: 1 VIEW(S) XRAY OF THE CHEST 07/07/2024 03:59:00 AM COMPARISON: 06/12/2024 CLINICAL HISTORY: shob cp FINDINGS: LUNGS AND PLEURA: Stable diffuse mild interstitial pulmonary edema. No pleural effusion. No pneumothorax. HEART AND MEDIASTINUM: Mild cardiomegaly. Aortic atherosclerosis. BONES AND SOFT TISSUES: No acute osseous abnormality. IMPRESSION: 1. Stable diffuse mild interstitial pulmonary edema. 2. Mild cardiomegaly. Electronically signed by: Dorethia Molt MD 07/07/2024 04:08 AM EST RP Workstation: [id number]        Scheduled Meds:  aspirin  EC  81 mg Oral Daily   diazepam   10 mg Oral QID   enoxaparin  (LOVENOX ) injection  40 mg Subcutaneous Q24H   feeding supplement  237 mL Oral BID BM   fluticasone   1 spray Each Nare BID   furosemide   40 mg Intravenous BID   isosorbide  mononitrate  60 mg Oral Daily   levothyroxine   25 mcg Oral Q0600   OLANZapine   5 mg Oral Daily   pantoprazole   40 mg Oral BID AC   ranolazine   500 mg Oral BID   rosuvastatin   5 mg Oral Once per day on Tuesday Thursday Saturday   senna-docusate  2 tablet Oral QHS    spironolactone   12.5 mg Oral Daily   Continuous Infusions:   LOS: 1 day     Devaughn KATHEE Ban, MD [facility]   If 7PM-7AM, please contact night-coverage [email or web address] Password [email or web address] 07/08/2024, 8:45 AM

## 2024-07-08 NOTE — TOC Initial Note (Signed)
 Transition of Care [facility]) - Initial/Assessment Note    Patient Details  Name: Victoria Medina MRN: [id number] Date of Birth: [birth year]  Transition of Care [facility]) CM/SW Contact:    Nathanael CHRISTELLA Ring, RN Phone Number: 07/08/2024, 11:05 AM  Clinical Narrative:                 Patient admitted to the hospital with HF.  She is long term care at [facility].  [facility] admissions coordinator at [facility] aware of admission.  Patient will return to [facility] at discharge.   Expected Discharge Plan: Skilled Nursing Facility Barriers to Discharge: Continued Medical Work up   Patient Goals and CMS Choice            Expected Discharge Plan and Services   Discharge Planning Services: CM Consult   Living arrangements for the past 2 months: Skilled Nursing Facility                 DME Arranged: N/A         HH Arranged: NA          Prior Living Arrangements/Services Living arrangements for the past 2 months: Skilled Nursing Facility Lives with:: Facility Resident Patient language and need for interpreter reviewed:: Yes Do you feel safe going back to the place where you live?: Yes      Need for Family Participation in Patient Care: Yes (Comment) Care giver support system in place?: Yes (comment)   Criminal Activity/Legal Involvement Pertinent to Current Situation/Hospitalization: No - Comment as needed  Activities of Daily Living      Permission Sought/Granted Permission sought to share information with : Facility Medical Sales Representative    Share Information with NAME: Saranda Legrande  Permission granted to share info w AGENCY: [place]  Permission granted to share info w Relationship: sister  Permission granted to share info w Contact Information: [phone]  Emotional Assessment Appearance:: Appears stated age       Alcohol / Substance Use: Not Applicable Psych Involvement: No (comment)  Admission diagnosis:  Hyponatremia [E87.1] Patient Active Problem List    Diagnosis Date Noted   Hyponatremia 07/07/2024   Delusional disorder (HCC) 06/17/2024   Encounter for medication titration 06/17/2024   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) 09/10/2023   DNR (do not resuscitate) 09/13/2022   Chronic systolic CHF (congestive heart failure) (HCC) - LVEF 20-25% 06/09/2022   Major depressive disorder, recurrent, moderate (HCC) 06/09/2022   Suprapubic catheter (HCC) 10/09/2021   Coronary artery disease involving native coronary artery of native heart with unstable angina pectoris (HCC) 10/09/2021   Microcytic anemia 10/09/2021   T2DM (type 2 diabetes mellitus) (HCC)    GERD (gastroesophageal reflux disease) 12/18/2014   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   Left knee pain 08/22/2013   Essential hypertension 07/11/2013   Acquired hypothyroidism 06/10/2013   Impingement syndrome of right shoulder 07/07/2012   Right shoulder pain 07/07/2012   Obesity, Class II, BMI 35-39.9 06/04/2012   GAD (generalized anxiety disorder) 02/07/2011   Insomnia 02/07/2011   Intractable chronic migraine without aura 02/07/2011   Mixed hyperlipidemia 02/07/2011   Obstructive sleep apnea (adult) (pediatric) 02/07/2011   PCP:  [name], DO Pharmacy:   [facility] - [place], [place] - [address] [place] [address] Phone: [phone] Fax: [phone]     Social Drivers of Health (SDOH) Social History: SDOH Screenings   Food Insecurity: No Food Insecurity (06/13/2024)  Housing: Low Risk  (06/13/2024)  Transportation Needs: No Transportation Needs (06/13/2024)  Utilities: Not At Risk (06/13/2024)  Depression (PHQ2-9): Low Risk  (06/10/2024)  Tobacco Use: Low Risk  (07/07/2024)   SDOH Interventions:     Readmission Risk Interventions     No data to display

## 2024-07-08 NOTE — Progress Notes (Signed)
 Heart Failure Stewardship Pharmacy Note  PCP: [name], DO PCP-Cardiologist: Deatrice Cage, MD  HPI: Victoria Medina is a 63 y.o. female with CHF, CVA with left-sided hemiplegia-wheelchair-bound, provoked DVT/PE no longer on anticoagulation, OSA, CAD, blindness, self-reported COPD  who presented with acute worsening of chronic shortness of breath. On admission, proBNP was 4983, HS-troponin was 44, and sodium was 114. Chest x-ray noted stable diffuse mild pulmonary edema.   Pertinent cardiac history: NSTEMI in 09/2013 with PCI of LAD/RCA. At that time, LVEF was 35%. In 11/2013 she underwent PCI of mid to distal LAD. STEMI in 12/2013 with PCI OM1. In 2021, presented with NSTEMI, LHC showing three-vessel disease and underwent PCI of mid left circumflex into OM. LVEF 30 to 35% on cath. TTE 05/2024 with severe LV dilatation and reduced LVEF of 20 to 25% with global hypokinesis, mild MR, G3DD.  Pertinent Lab Values: Creatinine, Ser  Date Value Ref Range Status  07/08/2024 0.85 0.44 - 1.00 mg/dL Final   BUN  Date Value Ref Range Status  07/08/2024 7 (L) 8 - 23 mg/dL Final  91/95/7974 6 4 - 21 Final   Potassium  Date Value Ref Range Status  07/08/2024 4.2 3.5 - 5.1 mmol/L Final   Sodium  Date Value Ref Range Status  07/08/2024 128 (L) 135 - 145 mmol/L Final  03/03/2024 136 (A) 137 - 147 Final   B Natriuretic Peptide  Date Value Ref Range Status  06/05/2024 473.9 (H) 0.0 - 100.0 pg/mL Final    Comment:    Performed at [facility], [address] [place], [place] [address]   Magnesium   Date Value Ref Range Status  06/05/2024 1.8 1.7 - 2.4 mg/dL Final    Comment:    Performed at [facility], [address] [place], [place] [address]   Hemoglobin A1C  Date Value Ref Range Status  11/29/2023 6.4  Final   Hgb A1c MFr Bld  Date Value Ref Range Status  06/12/2024 6.1 (H) 4.8 - 5.6 % Final    Comment:    (NOTE) Diagnosis of Diabetes The following HbA1c ranges  recommended by the American Diabetes Association (ADA) may be used as an aid in the diagnosis of diabetes mellitus.  Hemoglobin             Suggested A1C NGSP%              Diagnosis  <5.7                   Non Diabetic  5.7-6.4                Pre-Diabetic  >6.4                   Diabetic  <7.0                   Glycemic control for                       adults with diabetes.     TSH  Date Value Ref Range Status  06/04/2023 2.44 0.41 - 5.90 Final  09/13/2022 1.988 0.350 - 4.500 uIU/mL Final    Comment:    Performed by a 3rd Generation assay with a functional sensitivity of <=0.01 uIU/mL. Performed at [facility], [address] [place], [place] [address]     Vital Signs:  Temp:  [98 F (36.7 C)-98.6 F (37 C)] 98 F (36.7 C) (  12/09 0515) Pulse Rate:  [73-86] 73 (12/09 0800) Cardiac Rhythm: Normal sinus rhythm (12/09 0557) Resp:  [11-25] 11 (12/09 0800) BP: (96-125)/(57-87) 96/67 (12/09 0800) SpO2:  [100 %] 100 % (12/09 0800)  Intake/Output Summary (Last 24 hours) at 07/08/2024 0817 Last data filed at 07/08/2024 0440 Gross per 24 hour  Intake --  Output 2200 ml  Net -2200 ml    Current Heart Failure Medications:  Loop diuretic: furosemide  40 mg IV BID Beta-Blocker: none ACEI/ARB/ARNI: losartan  100 mg daily MRA: spironolactone  12.5 mg daily SGLT2i: none Other: none  Prior to admission Heart Failure Medications:  Loop diuretic: furosemide  40 mg daily Beta-Blocker: none ACEI/ARB/ARNI: losartan  100 mg daily MRA: spironolactone  25 mg daily SGLT2i: none Other: none  Assessment: 1. 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 NYHA class III symptoms.  -Symptoms: Reports shortness of breath is unchanged. Denies LEE. Reports appetite is fair. Denies orthopnea. -Volume: Likely still hypervolemic. Currently on furosemide  40 mg IV BID. Urine output if accurate is modest.. Continue current dose for  now. -Hemodynamics: BP WNL. HR 70-80s. Will observe BP response to home meds prior to recommending changes. -BB: Can consider adding BB prior to discharge when close to euvolemic. -ACEI/ARB/ARNI: Continue current losartan  100 mg daily. Not a great candidate for Entresto at this time due to hyponatremia. -MRA: Spironolactone  can be associated with hyponatremia. It was been resumed at lower dose. Can consider increasing when Na is back WNL. -SGLT2i: Not a good candidate due to chronic catheter and hyponatremia during this admission.  Plan: 1) Medication changes recommended at this time: - None  2) Patient assistance: -Pending  3) Education: -- Patient has been educated on current HF medications and potential additions to HF medication regimen - Patient verbalizes understanding that over the next few months, these medication doses may change and more medications may be added to optimize HF regimen - Patient has been educated on basic disease state pathophysiology and goals of therapy  Please do not hesitate to reach out with questions or concerns,  Josuel Koeppen, PharmD, CPP, BCPS, [facility] 2 Heart Failure Pharmacist  Phone - [phone] 07/08/2024 8:17 AM

## 2024-07-08 NOTE — Progress Notes (Signed)
 [facility] CLINIC CARDIOLOGY PROGRESS NOTE       Patient ID: Victoria Medina MRN: [id number] DOB/AGE: 01/21/1961 63 y.o.  Admit date: 07/07/2024 Referring Physician Dr. Nena Rebel Primary Physician [name], DO  Primary Cardiologist Dr. Wilburn Reason for Consultation AoCHF  HPI: Victoria Medina is a 63 y.o. female  with a past medical history of chronic HFrEF, coronary artery disease s/p multiple stents, hypertension, history of stroke with flaccid hemiaplasia, hypothyroidism, morbid obesity, blindness, chronic suprapubic catheter  who presented to the ED on 07/07/2024 for SOB. Cardiology was consulted for further evaluation.   Interval history: -Patient seen and examined this AM, resting in hospital bed.  -Reports headache this AM and persistent cough.  -WOB improved. Diuresed well with stable renal function.   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 U/S of lower extremity revealed DVT in L leg. CTA revealed bilateral lower lobe segmental PEs and R upper lobe segmental PE. Patient was started on lovenox  and transferred upstairs. Cardiac enzymes were negative x1. NOAC for 7 months.         History of stroke 10/09/2021   Hyperlipemia    Myocardial infarction [facility])    NSTEMI (non-ST elevated myocardial infarction) (HCC) 10/27/2013   Formatting of this note might be different from the original.  01/08/2014: LHC: 2nd Marginal: 99% (pre) to 0% (post); STENT, PROMUS PREMIER MR 2.25X12MM     12/04/2013: LHC: Distal LAD: 70% (pre) to 0% (post) CATH, MINI TREK RX  2.0X20MM STENT, PROMUS PREMIER MR 2.25X28MM; Mid LAD: 70% (pre) to 0% (post), STENT, PROMUS PREMIER MR 2.50X12MM     10/27/2013. LHC: 3 vessel Disease, Stent x2:  Proximal LAD:   Stroke [facility])     Past Surgical History:  Procedure Laterality Date   ABDOMINAL HYSTERECTOMY     BACK SURGERY     CARDIAC CATHETERIZATION     CORONARY/GRAFT ACUTE MI REVASCULARIZATION N/A 01/25/2020   Procedure: Coronary/Graft Acute MI Revascularization;  Surgeon: [name], MD;  Location: [facility] INVASIVE CV LAB;  Service: Cardiovascular;  Laterality: N/A;   FRACTURE SURGERY     IR CATHETER TUBE CHANGE  08/10/2021   LEFT HEART CATH AND CORONARY ANGIOGRAPHY N/A 01/25/2020   Procedure: LEFT HEART CATH AND CORONARY ANGIOGRAPHY;  Surgeon: [name], MD;  Location: [facility] INVASIVE CV LAB;  Service: Cardiovascular;  Laterality: N/A;   SKIN GRAFT Right    TOTAL ABDOMINAL HYSTERECTOMY Bilateral     (Not in a hospital admission)  Social History   Socioeconomic History   Marital status: Single    Spouse name: Not on file   Number of children: Not on file   Years of education: Not on file   Highest education level: Not on file  Occupational History   Not on file  Tobacco Use   Smoking status: Never   Smokeless tobacco: Never  Vaping Use   Vaping status: Never Used  Substance and Sexual Activity   Alcohol use: Yes    Comment: occasional   Drug use: No   Sexual activity: Not Currently  Other Topics Concern  Not on file  Social History Narrative   Not on file   Social Drivers of Health   Financial Resource Strain: Not on file  Food Insecurity: No Food Insecurity (06/13/2024)   Hunger Vital Sign    Worried About Running Out of Food in the Last Year: Never true    Ran Out of Food in the Last Year: Never true  Transportation Needs: No Transportation Needs (06/13/2024)   PRAPARE - Administrator, Civil Service (Medical): No    Lack of Transportation (Non-Medical): No  Physical  Activity: Not on file  Stress: Not on file  Social Connections: Not on file  Intimate Partner Violence: Not At Risk (06/13/2024)   Humiliation, Afraid, Rape, and Kick questionnaire    Fear of Current or Ex-Partner: No    Emotionally Abused: No    Physically Abused: No    Sexually Abused: No    Family History  Problem Relation Age of Onset   Heart attack Mother    Hypertension Mother    Hypercholesterolemia Mother    Diabetes Mother    Stroke Father    Heart attack Father    Hypertension Father    Hypercholesterolemia Father    Diabetes Father    Diabetes Maternal Grandmother    Cancer - Other Maternal Grandmother      Vitals:   07/08/24 0515 07/08/24 0700 07/08/24 0800 07/08/24 0830  BP:  103/64 96/67 105/60  Pulse:  73 73 80  Resp:  17 11 15   Temp: 98 F (36.7 C)     TempSrc: Oral     SpO2:  100% 100% 95%  Weight:      Height:        PHYSICAL EXAM General: Chronically ill appearing female, well nourished, in no acute distress. HEENT: Normocephalic and atraumatic. Neck: No JVD.  Lungs: Normal respiratory effort on 2L [place]. Bibasilar crackles Heart: HRRR. Normal S1 and S2 without gallops or murmurs.  Abdomen: Non-distended appearing.  Msk: Normal strength and tone for age. Extremities: Warm and well perfused. No clubbing, cyanosis. No edema.  Neuro: Alert and oriented X 3. Psych: Answers questions appropriately.   Labs: Basic Metabolic Panel: Recent Labs    07/07/24 0340 07/07/24 0609 07/07/24 1020 07/07/24 1630 07/07/24 2101 07/08/24 0447  NA 114*   < > 119*   < > 125* 128*  K 3.6  --   --   --   --  4.2  CL 79*  --   --   --   --  87*  CO2 20*  --   --   --   --  28  GLUCOSE 205*  --   --   --   --  141*  BUN <5*  --   --   --   --  7*  CREATININE 0.87  --  0.84  --   --  0.85  CALCIUM  8.7*  --   --   --   --  9.2   < > = values in this interval not displayed.   Liver Function Tests: Recent Labs    07/07/24 0340 07/08/24 0447  AST 17 13*  ALT  6 7  ALKPHOS 104 104  BILITOT 0.6 0.5  PROT 6.9 7.0  ALBUMIN 4.0 4.2   Recent Labs    07/07/24 0340  LIPASE 24   CBC: Recent Labs    07/07/24 0340 07/07/24 1020 07/08/24 0447  WBC 12.2* 9.0 14.4*  NEUTROABS 10.2*  --   --  HGB 12.7 12.6 12.6  HCT 38.5 37.4 37.4  MCV 82.4 80.8 82.0  PLT 268 308 392   Cardiac Enzymes: No results for input(s): CKTOTAL, CKMB, CKMBINDEX, TROPONINIHS in the last 72 hours. BNP: No results for input(s): BNP in the last 72 hours. D-Dimer: No results for input(s): DDIMER in the last 72 hours. Hemoglobin A1C: No results for input(s): HGBA1C in the last 72 hours. Fasting Lipid Panel: No results for input(s): CHOL, HDL, LDLCALC, TRIG, CHOLHDL, LDLDIRECT in the last 72 hours. Thyroid Function Tests: No results for input(s): TSH, T4TOTAL, T3FREE, THYROIDAB in the last 72 hours.  Invalid input(s): FREET3 Anemia Panel: No results for input(s): VITAMINB12, FOLATE, FERRITIN, TIBC, IRON, RETICCTPCT in the last 72 hours.   Radiology: [facility] Chest Port 1 View Result Date: 07/07/2024 EXAM: 1 VIEW(S) XRAY OF THE CHEST 07/07/2024 03:59:00 AM COMPARISON: 06/12/2024 CLINICAL HISTORY: shob cp FINDINGS: LUNGS AND PLEURA: Stable diffuse mild interstitial pulmonary edema. No pleural effusion. No pneumothorax. HEART AND MEDIASTINUM: Mild cardiomegaly. Aortic atherosclerosis. BONES AND SOFT TISSUES: No acute osseous abnormality. IMPRESSION: 1. Stable diffuse mild interstitial pulmonary edema. 2. Mild cardiomegaly. Electronically signed by: Dorethia Molt MD 07/07/2024 04:08 AM EST RP Workstation: [id number]   DG Chest Port 1 View Result Date: 06/12/2024 CLINICAL DATA:  Cough EXAM: PORTABLE CHEST 1 VIEW COMPARISON:  06/05/2024 FINDINGS: Cardiac shadow is stable. Aortic calcifications are noted. Diffuse interstitial changes are seen consistent with edema. No effusion is seen. No focal infiltrate is noted. Postsurgical changes in  the cervical spine are seen. IMPRESSION: Interstitial edema. Electronically Signed   By: Oneil Devonshire M.D.   On: 06/12/2024 02:06    ECHO 05/2024: 1. Left ventricular ejection fraction, by estimation, is 20 to 25%. The left ventricle has severely decreased function. The left ventricle demonstrates global hypokinesis. The left ventricular internal cavity size was moderately to severely dilated. There is moderate asymmetric left ventricular hypertrophy of the inferior and basal segments. Left ventricular diastolic parameters are consistent with Grade III diastolic dysfunction (restrictive).   2. Right ventricular systolic function is mildly reduced. The right ventricular size is mildly enlarged.   3. The mitral valve is grossly normal. Mild mitral valve regurgitation.   4. The aortic valve is normal in structure. Aortic valve regurgitation is not visualized.   TELEMETRY (personally reviewed): sinus rhythm rate 70s  EKG (personally reviewed): NSR rate 88 bpm  Data reviewed by me 07/08/2024: last 24h vitals tele labs imaging I/O ED provider note, admission H&P  Principal Problem:   Hyponatremia Active Problems:   T2DM (type 2 diabetes mellitus) (HCC)   Essential hypertension   Cortical blindness   Acquired hypothyroidism   GERD (gastroesophageal reflux disease)   Mixed hyperlipidemia   Obesity, Class II, BMI 35-39.9   Obstructive sleep apnea (adult) (pediatric)   Suprapubic catheter (HCC)   Chronic systolic CHF (congestive heart failure) (HCC) - LVEF 20-25%   Major depressive disorder, recurrent, moderate (HCC)   DNR (do not resuscitate)   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)    ASSESSMENT AND PLAN:  Victoria Medina is a 63 y.o. female  with a past medical history of chronic HFrEF, coronary artery disease s/p multiple stents, hypertension, history of stroke with flaccid hemiaplasia, hypothyroidism, morbid obesity, blindness, chronic  suprapubic catheter  who presented to the ED on 07/07/2024 for SOB. Cardiology was consulted for further evaluation.   # Acute on chronic HFrEF # Coronary artery disease # Demand ischemia # Hypertension #  Hyponatremia # Hx stroke, hemiplasia  Patient with worsening SOB, BNP elevated at 4900. Started on IV lasix  in the ED as well as 3% hypertonic saline. Echo last admission with EF 20-25%, global hypokinesis, moderate to severe LV dilation, grade III diastolic dysfunction.  -Continue IV lasix  40 mg BID. Continue home losartan  100 mg daily, spironolactone  12.5 mg daily. Will defer increasing spironolactone  today given borderline BP, reconsider in future.  -Continue imdur  60 mg daily and ranexa  500 mg BID.  -Continue crestor  5 mg daily and aspirin  81 mg daily.  -Minimal and flat troponin most consistent with demand/supply mismatch and not ACS in the setting of acute heart failure exacerbation. No plan for further cardiac diagnostics.  -Nephrology following, appreciate recommendations.    This patient's plan of care was discussed and created with Dr. Ammon and he is in agreement.  Signed: Danita Bloch, PA-C  07/08/2024, 9:46 AM [facility] Cardiology

## 2024-07-08 NOTE — Progress Notes (Addendum)
 [facility]  ROUNDING NOTE   Subjective:   Patient seen laying in bed States she ate 2 bananas for breakfast Feels she needs to cough, but can't get it up Weaned to room air  Sodium 128 Urine output 2.2L  Objective:  Vital signs in last 24 hours:  Temp:  [97.6 F (36.4 C)-98.6 F (37 C)] 97.6 F (36.4 C) (12/09 1116) Pulse Rate:  [73-89] 89 (12/09 1116) Resp:  [11-25] 15 (12/09 1116) BP: (91-125)/(57-87) 104/86 (12/09 1116) SpO2:  [95 %-100 %] 100 % (12/09 1116)  Weight change:  Filed Weights   07/07/24 0330  Weight: 83.3 kg    Intake/Output: I/O last 3 completed shifts: In: -  Out: 2200 [Urine:2200]   Intake/Output this shift:  No intake/output data recorded.  Physical Exam: General: NAD  Head: Normocephalic, atraumatic. Moist oral mucosal membranes  Eyes: Anicteric  Lungs:  Clear to auscultation, normal effort  Heart: Regular rate and rhythm  Abdomen:  Soft, nontender  Extremities: No  peripheral edema.  Neurologic: Awake, alert, conversant  Skin: Warm,dry, no rash  GU Foley catheter    Basic Metabolic Panel: Recent Labs  Lab 07/07/24 0340 07/07/24 0609 07/07/24 0721 07/07/24 1020 07/07/24 1630 07/07/24 2101 07/08/24 0447  NA 114*   < > 119* 119* 124* 125* 128*  K 3.6  --   --   --   --   --  4.2  CL 79*  --   --   --   --   --  87*  CO2 20*  --   --   --   --   --  28  GLUCOSE 205*  --   --   --   --   --  141*  BUN <5*  --   --   --   --   --  7*  CREATININE 0.87  --   --  0.84  --   --  0.85  CALCIUM  8.7*  --   --   --   --   --  9.2   < > = values in this interval not displayed.    Liver Function Tests: Recent Labs  Lab 07/07/24 0340 07/08/24 0447  AST 17 13*  ALT 6 7  ALKPHOS 104 104  BILITOT 0.6 0.5  PROT 6.9 7.0  ALBUMIN 4.0 4.2   Recent Labs  Lab 07/07/24 0340  LIPASE 24   No results for input(s): AMMONIA in the last 168 hours.  CBC: Recent Labs  Lab 07/07/24 0340 07/07/24 1020 07/08/24 0447   WBC 12.2* 9.0 14.4*  NEUTROABS 10.2*  --   --   HGB 12.7 12.6 12.6  HCT 38.5 37.4 37.4  MCV 82.4 80.8 82.0  PLT 268 308 392    Cardiac Enzymes: No results for input(s): CKTOTAL, CKMB, CKMBINDEX, TROPONINI in the last 168 hours.  BNP: Invalid input(s): POCBNP  CBG: Recent Labs  Lab 07/07/24 1416 07/07/24 1823 07/07/24 2219 07/08/24 0815  GLUCAP 280* 220* 157* 142*    Microbiology: Results for orders placed or performed during the hospital encounter of 07/07/24  Resp panel by RT-PCR (RSV, Flu A&B, Covid) Anterior Nasal Swab     Status: None   Collection Time: 07/07/24  3:40 AM   Specimen: Anterior Nasal Swab  Result Value Ref Range Status   SARS Coronavirus 2 by RT PCR NEGATIVE NEGATIVE Final    Comment: (NOTE) SARS-CoV-2 target nucleic acids are NOT DETECTED.  The SARS-CoV-2 RNA is  generally detectable in upper respiratory specimens during the acute phase of infection. The lowest concentration of SARS-CoV-2 viral copies this assay can detect is 138 copies/mL. A negative result does not preclude SARS-Cov-2 infection and should not be used as the sole basis for treatment or other patient management decisions. A negative result may occur with  improper specimen collection/handling, submission of specimen other than nasopharyngeal swab, presence of viral mutation(s) within the areas targeted by this assay, and inadequate number of viral copies(<138 copies/mL). A negative result must be combined with clinical observations, patient history, and epidemiological information. The expected result is Negative.  Fact Sheet for Patients:  bloggercourse.com  Fact Sheet for Healthcare Providers:  seriousbroker.it  This test is no t yet approved or cleared by the [place]  FDA and  has been authorized for detection and/or diagnosis of SARS-CoV-2 by FDA under an Emergency Use Authorization (EUA). This EUA will  remain  in effect (meaning this test can be used) for the duration of the COVID-19 declaration under Section 564(b)(1) of the Act, 21 U.S.C.section 360bbb-3(b)(1), unless the authorization is terminated  or revoked sooner.       Influenza A by PCR NEGATIVE NEGATIVE Final   Influenza B by PCR NEGATIVE NEGATIVE Final    Comment: (NOTE) The Xpert Xpress SARS-CoV-2/FLU/RSV plus assay is intended as an aid in the diagnosis of influenza from Nasopharyngeal swab specimens and should not be used as a sole basis for treatment. Nasal washings and aspirates are unacceptable for Xpert Xpress SARS-CoV-2/FLU/RSV testing.  Fact Sheet for Patients: bloggercourse.com  Fact Sheet for Healthcare Providers: seriousbroker.it  This test is not yet approved or cleared by the [place]  FDA and has been authorized for detection and/or diagnosis of SARS-CoV-2 by FDA under an Emergency Use Authorization (EUA). This EUA will remain in effect (meaning this test can be used) for the duration of the COVID-19 declaration under Section 564(b)(1) of the Act, 21 U.S.C. section 360bbb-3(b)(1), unless the authorization is terminated or revoked.     Resp Syncytial Virus by PCR NEGATIVE NEGATIVE Final    Comment: (NOTE) Fact Sheet for Patients: bloggercourse.com  Fact Sheet for Healthcare Providers: seriousbroker.it  This test is not yet approved or cleared by the [place]  FDA and has been authorized for detection and/or diagnosis of SARS-CoV-2 by FDA under an Emergency Use Authorization (EUA). This EUA will remain in effect (meaning this test can be used) for the duration of the COVID-19 declaration under Section 564(b)(1) of the Act, 21 U.S.C. section 360bbb-3(b)(1), unless the authorization is terminated or revoked.  Performed at [facility], [address] [place], [place] [address]      Coagulation Studies: Recent Labs    07/08/24 0447  LABPROT 15.0  INR 1.1    Urinalysis: No results for input(s): COLORURINE, LABSPEC, PHURINE, GLUCOSEU, HGBUR, BILIRUBINUR, KETONESUR, PROTEINUR, UROBILINOGEN, NITRITE, LEUKOCYTESUR in the last 72 hours.  Invalid input(s): APPERANCEUR    Imaging: DG Chest Port 1 View Result Date: 07/07/2024 EXAM: 1 VIEW(S) XRAY OF THE CHEST 07/07/2024 03:59:00 AM COMPARISON: 06/12/2024 CLINICAL HISTORY: shob cp FINDINGS: LUNGS AND PLEURA: Stable diffuse mild interstitial pulmonary edema. No pleural effusion. No pneumothorax. HEART AND MEDIASTINUM: Mild cardiomegaly. Aortic atherosclerosis. BONES AND SOFT TISSUES: No acute osseous abnormality. IMPRESSION: 1. Stable diffuse mild interstitial pulmonary edema. 2. Mild cardiomegaly. Electronically signed by: Dorethia Molt MD 07/07/2024 04:08 AM EST RP Workstation: [id number]     Medications:     aspirin  EC  81 mg Oral Daily  diazepam   10 mg Oral QID   enoxaparin  (LOVENOX ) injection  40 mg Subcutaneous Q24H   feeding supplement  237 mL Oral BID BM   fluticasone   1 spray Each Nare BID   furosemide   40 mg Intravenous BID   isosorbide  mononitrate  60 mg Oral Daily   levothyroxine   25 mcg Oral Q0600   OLANZapine   5 mg Oral Daily   pantoprazole   40 mg Oral BID AC   ranolazine   500 mg Oral BID   rosuvastatin   5 mg Oral Once per day on Tuesday Thursday Saturday   senna-docusate  2 tablet Oral QHS   spironolactone   12.5 mg Oral Daily   acetaminophen  **OR** acetaminophen , ondansetron  **OR** ondansetron  (ZOFRAN ) IV, oxyCODONE , polyethylene glycol  Assessment/ Plan:  Ms. Victoria Medina is a 63 y.o.  female with medical problems of  Coronary artery disease, history of stroke, morbid obesity, congestive heart failure, hypothyroidism, blindness and chronic suprapubic catheter    was admitted on 07/07/2024 for Hyponatremia [E87.1]   Hyponatremia likely due to excessive consumption of  water in the setting of chronic systolic CHF. Patient has responded well to IV furosemide    Recommendations:  We discussed with the patient regarding following strict fluid restriction and cutting back on eating ice.  Sodium has improved to 128 3% hypertonic saline stopped yesterday.  Continue fluid restriction to about 1200 cc/day.  (Including ice) Low BUN suggest low protein intake.  Continue protein supplementation with Ensure.     LOS: 1 Victoria Medina 12/9/202512:34 PM  Patient was seen and examined with Faith Harris, NP.  Plan of care was formulated for the problems addressed and discussed with NP.  I agree with the note as documented except as noted below.

## 2024-07-09 ENCOUNTER — Encounter: Payer: Self-pay | Admitting: Internal Medicine

## 2024-07-09 DIAGNOSIS — I5041 Acute combined systolic (congestive) and diastolic (congestive) heart failure: Secondary | ICD-10-CM

## 2024-07-09 LAB — BASIC METABOLIC PANEL WITH GFR
Anion gap: 9 (ref 5–15)
BUN: 14 mg/dL (ref 8–23)
CO2: 32 mmol/L (ref 22–32)
Calcium: 8.9 mg/dL (ref 8.9–10.3)
Chloride: 88 mmol/L — ABNORMAL LOW (ref 98–111)
Creatinine, Ser: 0.99 mg/dL (ref 0.44–1.00)
GFR, Estimated: >60 mL/min (ref >60)
Glucose, Bld: 114 mg/dL — ABNORMAL HIGH (ref 70–99)
Potassium: 3.9 mmol/L (ref 3.5–5.1)
Sodium: 130 mmol/L — ABNORMAL LOW (ref 135–145)

## 2024-07-09 MED ORDER — NITROGLYCERIN 0.4 MG SL SUBL: 0.4000 mg | SUBLINGUAL_TABLET | SUBLINGUAL | Status: DC | PRN

## 2024-07-09 MED ORDER — SALINE SPRAY 0.65 % NA SOLN: 1.0000 | NASAL | Status: DC | PRN

## 2024-07-09 NOTE — Plan of Care (Signed)
  Problem: Education: Goal: Ability to describe self-care measures that may prevent or decrease complications (Diabetes Survival Skills Education) will improve Outcome: Progressing   Problem: Coping: Goal: Ability to adjust to condition or change in health will improve Outcome: Progressing   Problem: Fluid Volume: Goal: Ability to maintain a balanced intake and output will improve Outcome: Progressing   Problem: Health Behavior/Discharge Planning: Goal: Ability to identify and utilize available resources and services will improve Outcome: Progressing Goal: Ability to manage health-related needs will improve Outcome: Progressing   Problem: Metabolic: Goal: Ability to maintain appropriate glucose levels will improve Outcome: Progressing   Problem: Nutritional: Goal: Maintenance of adequate nutrition will improve Outcome: Progressing Goal: Progress toward achieving an optimal weight will improve Outcome: Progressing   Problem: Skin Integrity: Goal: Risk for impaired skin integrity will decrease Outcome: Progressing   Problem: Tissue Perfusion: Goal: Adequacy of tissue perfusion will improve Outcome: Progressing   Problem: Education: Goal: Knowledge of General Education information will improve Description: Including pain rating scale, medication(s)/side effects and non-pharmacologic comfort measures Outcome: Progressing   Problem: Health Behavior/Discharge Planning: Goal: Ability to manage health-related needs will improve Outcome: Progressing   Problem: Clinical Measurements: Goal: Ability to maintain clinical measurements within normal limits will improve Outcome: Progressing Goal: Will remain free from infection Outcome: Progressing Goal: Diagnostic test results will improve Outcome: Progressing Goal: Respiratory complications will improve Outcome: Progressing Goal: Cardiovascular complication will be avoided Outcome: Progressing   Problem: Activity: Goal:  Risk for activity intolerance will decrease Outcome: Progressing   Problem: Nutrition: Goal: Adequate nutrition will be maintained Outcome: Progressing   Problem: Coping: Goal: Level of anxiety will decrease Outcome: Progressing   Problem: Elimination: Goal: Will not experience complications related to bowel motility Outcome: Progressing Goal: Will not experience complications related to urinary retention Outcome: Progressing   Problem: Pain Managment: Goal: General experience of comfort will improve and/or be controlled Outcome: Progressing   Problem: Safety: Goal: Ability to remain free from injury will improve Outcome: Progressing   Problem: Skin Integrity: Goal: Risk for impaired skin integrity will decrease Outcome: Progressing   Problem: Education: Goal: Ability to demonstrate management of disease process will improve Outcome: Progressing Goal: Ability to verbalize understanding of medication therapies will improve Outcome: Progressing   Problem: Cardiac: Goal: Ability to achieve and maintain adequate cardiopulmonary perfusion will improve Outcome: Progressing

## 2024-07-09 NOTE — Assessment & Plan Note (Signed)
 At baseline

## 2024-07-09 NOTE — Progress Notes (Signed)
 [facility] CLINIC CARDIOLOGY PROGRESS NOTE       Patient ID: Victoria Medina MRN: [id number] DOB/AGE: 63-Aug-1962 63 y.o.  Admit date: 07/07/2024 Referring Physician Dr. Nena Rebel Primary Physician [name], DO  Primary Cardiologist Dr. Wilburn Reason for Consultation AoCHF  HPI: Victoria Medina is a 63 y.o. female  with a past medical history of chronic HFrEF, coronary artery disease s/p multiple stents, hypertension, history of stroke with flaccid hemiaplasia, hypothyroidism, morbid obesity, blindness, chronic suprapubic catheter  who presented to the ED on 07/07/2024 for SOB. Cardiology was consulted for further evaluation.   Interval history: -Patient seen and examined this AM, resting in hospital bed.  -Feels SOB is improved but still with some phlegm in her throat. -Renal function stable with continued good UOP.  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 U/S of lower extremity revealed DVT in L leg. CTA revealed bilateral lower lobe segmental PEs and R upper lobe segmental PE. Patient was started on lovenox  and transferred upstairs. Cardiac enzymes were negative x1. NOAC for 7 months.         History of stroke 10/09/2021   Hyperlipemia    Myocardial infarction [facility])    NSTEMI (non-ST elevated myocardial infarction) (HCC) 10/27/2013   Formatting of this note might be different from the original.  01/08/2014: LHC: 2nd Marginal: 99% (pre) to 0% (post); STENT, PROMUS PREMIER MR 2.25X12MM     12/04/2013: LHC: Distal LAD: 70% (pre) to 0% (post) CATH, MINI TREK  RX 2.0X20MM STENT, PROMUS PREMIER MR 2.25X28MM; Mid LAD: 70% (pre) to 0% (post), STENT, PROMUS PREMIER MR 2.50X12MM     10/27/2013. LHC: 3 vessel Disease, Stent x2:  Proximal LAD:   Stroke [facility])     Past Surgical History:  Procedure Laterality Date   ABDOMINAL HYSTERECTOMY     BACK SURGERY     CARDIAC CATHETERIZATION     CORONARY/GRAFT ACUTE MI REVASCULARIZATION N/A 01/25/2020   Procedure: Coronary/Graft Acute MI Revascularization;  Surgeon: [name], MD;  Location: [facility] INVASIVE CV LAB;  Service: Cardiovascular;  Laterality: N/A;   FRACTURE SURGERY     IR CATHETER TUBE CHANGE  08/10/2021   LEFT HEART CATH AND CORONARY ANGIOGRAPHY N/A 01/25/2020   Procedure: LEFT HEART CATH AND CORONARY ANGIOGRAPHY;  Surgeon: [name], MD;  Location: [facility] INVASIVE CV LAB;  Service: Cardiovascular;  Laterality: N/A;   SKIN GRAFT Right    TOTAL ABDOMINAL HYSTERECTOMY Bilateral     (Not in a hospital admission)  Social History   Socioeconomic History   Marital status: Single    Spouse name: Not on file   Number of children: Not on file   Years of education: Not on file   Highest education level: Not on file  Occupational History   Not on file  Tobacco Use   Smoking status: Never   Smokeless tobacco: Never  Vaping Use   Vaping status: Never Used  Substance and Sexual Activity   Alcohol use: Yes    Comment: occasional   Drug use: No   Sexual activity: Not Currently  Other  Topics Concern   Not on file  Social History Narrative   Not on file   Social Drivers of Health   Financial Resource Strain: Not on file  Food Insecurity: No Food Insecurity (06/13/2024)   Hunger Vital Sign    Worried About Running Out of Food in the Last Year: Never true    Ran Out of Food in the Last Year: Never true  Transportation Needs: No Transportation Needs (06/13/2024)   PRAPARE - Administrator, Civil Service (Medical): No    Lack of Transportation (Non-Medical): No  Physical  Activity: Not on file  Stress: Not on file  Social Connections: Not on file  Intimate Partner Violence: Not At Risk (06/13/2024)   Humiliation, Afraid, Rape, and Kick questionnaire    Fear of Current or Ex-Partner: No    Emotionally Abused: No    Physically Abused: No    Sexually Abused: No    Family History  Problem Relation Age of Onset   Heart attack Mother    Hypertension Mother    Hypercholesterolemia Mother    Diabetes Mother    Stroke Father    Heart attack Father    Hypertension Father    Hypercholesterolemia Father    Diabetes Father    Diabetes Maternal Grandmother    Cancer - Other Maternal Grandmother      Vitals:   07/09/24 0535 07/09/24 0710 07/09/24 0743 07/09/24 0745  BP: 93/61  107/73 107/73  Pulse: 81  80 79  Resp: 14  15 20   Temp:   98.5 F (36.9 C)   TempSrc:   Oral   SpO2: 96% 96% 96% 95%  Weight:      Height:        PHYSICAL EXAM General: Chronically ill appearing female, well nourished, in no acute distress. HEENT: Normocephalic and atraumatic. Neck: No JVD.  Lungs: Normal respiratory effort on 2L [place]. Bibasilar crackles Heart: HRRR. Normal S1 and S2 without gallops or murmurs.  Abdomen: Non-distended appearing.  Msk: Normal strength and tone for age. Extremities: Warm and well perfused. No clubbing, cyanosis. No edema.  Neuro: Alert and oriented X 3. Psych: Answers questions appropriately.   Labs: Basic Metabolic Panel: Recent Labs    07/08/24 0447 07/08/24 1548 07/09/24 0611  NA 128* 126* 130*  K 4.2  --  3.9  CL 87*  --  88*  CO2 28  --  32  GLUCOSE 141*  --  114*  BUN 7*  --  14  CREATININE 0.85  --  0.99  CALCIUM  9.2  --  8.9   Liver Function Tests: Recent Labs    07/07/24 0340 07/08/24 0447  AST 17 13*  ALT 6 7  ALKPHOS 104 104  BILITOT 0.6 0.5  PROT 6.9 7.0  ALBUMIN 4.0 4.2   Recent Labs    07/07/24 0340  LIPASE 24   CBC: Recent Labs    07/07/24 0340 07/07/24 1020 07/08/24 0447  WBC 12.2* 9.0 14.4*   NEUTROABS 10.2*  --   --   HGB 12.7 12.6 12.6  HCT 38.5 37.4 37.4  MCV 82.4 80.8 82.0  PLT 268 308 392   Cardiac Enzymes: No results for input(s): CKTOTAL, CKMB, CKMBINDEX, TROPONINIHS in the last 72 hours. BNP: No results for input(s): BNP in the last 72 hours. D-Dimer: No results for input(s): DDIMER in the last 72 hours. Hemoglobin A1C: No results for input(s): HGBA1C in the last 72 hours. Fasting Lipid Panel: No results for  input(s): CHOL, HDL, LDLCALC, TRIG, CHOLHDL, LDLDIRECT in the last 72 hours. Thyroid Function Tests: Recent Labs    07/08/24 0447  TSH 0.976   Anemia Panel: No results for input(s): VITAMINB12, FOLATE, FERRITIN, TIBC, IRON, RETICCTPCT in the last 72 hours.   Radiology: [facility] Chest Port 1 View Result Date: 07/07/2024 EXAM: 1 VIEW(S) XRAY OF THE CHEST 07/07/2024 03:59:00 AM COMPARISON: 06/12/2024 CLINICAL HISTORY: shob cp FINDINGS: LUNGS AND PLEURA: Stable diffuse mild interstitial pulmonary edema. No pleural effusion. No pneumothorax. HEART AND MEDIASTINUM: Mild cardiomegaly. Aortic atherosclerosis. BONES AND SOFT TISSUES: No acute osseous abnormality. IMPRESSION: 1. Stable diffuse mild interstitial pulmonary edema. 2. Mild cardiomegaly. Electronically signed by: Dorethia Molt MD 07/07/2024 04:08 AM EST RP Workstation: [id number]   DG Chest Port 1 View Result Date: 06/12/2024 CLINICAL DATA:  Cough EXAM: PORTABLE CHEST 1 VIEW COMPARISON:  06/05/2024 FINDINGS: Cardiac shadow is stable. Aortic calcifications are noted. Diffuse interstitial changes are seen consistent with edema. No effusion is seen. No focal infiltrate is noted. Postsurgical changes in the cervical spine are seen. IMPRESSION: Interstitial edema. Electronically Signed   By: Oneil Devonshire M.D.   On: 06/12/2024 02:06    ECHO 05/2024: 1. Left ventricular ejection fraction, by estimation, is 20 to 25%. The left ventricle has severely decreased function. The left  ventricle demonstrates global hypokinesis. The left ventricular internal cavity size was moderately to severely dilated. There is moderate asymmetric left ventricular hypertrophy of the inferior and basal segments. Left ventricular diastolic parameters are consistent with Grade III diastolic dysfunction (restrictive).   2. Right ventricular systolic function is mildly reduced. The right ventricular size is mildly enlarged.   3. The mitral valve is grossly normal. Mild mitral valve regurgitation.   4. The aortic valve is normal in structure. Aortic valve regurgitation is not visualized.   TELEMETRY (personally reviewed): sinus rhythm rate 70s  EKG (personally reviewed): NSR rate 88 bpm  Data reviewed by me 07/09/2024: last 24h vitals tele labs imaging I/O ED provider note, admission H&P  Principal Problem:   Hyponatremia Active Problems:   T2DM (type 2 diabetes mellitus) (HCC)   Essential hypertension   Cortical blindness   Acquired hypothyroidism   GERD (gastroesophageal reflux disease)   Mixed hyperlipidemia   Obesity, Class II, BMI 35-39.9   Obstructive sleep apnea (adult) (pediatric)   Suprapubic catheter (HCC)   Chronic systolic CHF (congestive heart failure) (HCC) - LVEF 20-25%   Major depressive disorder, recurrent, moderate (HCC)   DNR (do not resuscitate)   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)    ASSESSMENT AND PLAN:  Victoria Medina is a 63 y.o. female  with a past medical history of chronic HFrEF, coronary artery disease s/p multiple stents, hypertension, history of stroke with flaccid hemiaplasia, hypothyroidism, morbid obesity, blindness, chronic suprapubic catheter  who presented to the ED on 07/07/2024 for SOB. Cardiology was consulted for further evaluation.   # Acute on chronic HFrEF # Coronary artery disease # Demand ischemia # Hypertension # Hyponatremia # Hx stroke, hemiplasia  Patient with worsening SOB, BNP  elevated at 4900. Started on IV lasix  in the ED as well as 3% hypertonic saline. Echo last admission with EF 20-25%, global hypokinesis, moderate to severe LV dilation, grade III diastolic dysfunction.  -Continue IV lasix  40 mg BID. Continue home losartan  100 mg daily, spironolactone  12.5 mg daily. Will defer increasing spironolactone  today given borderline BP, reconsider in future.  -Continue imdur  60 mg daily and ranexa   500 mg BID.  -Continue crestor  5 mg daily and aspirin  81 mg daily.  -Minimal and flat troponin most consistent with demand/supply mismatch and not ACS in the setting of acute heart failure exacerbation. No plan for further cardiac diagnostics.  -Nephrology following, appreciate recommendations.    This patient's plan of care was discussed and created with Dr. Ammon and he is in agreement.  Signed: Danita Bloch, PA-C  07/09/2024, 9:01 AM [facility]

## 2024-07-09 NOTE — Progress Notes (Signed)
 [facility]  ROUNDING NOTE   Subjective:   Patient sitting up in bed Alert and oriented Room air  Sodium 130 Urine output 2.2L  Objective:  Vital signs in last 24 hours:  Temp:  [97.6 F (36.4 C)-98.5 F (36.9 C)] 98.4 F (36.9 C) (12/10 1112) Pulse Rate:  [79-93] 93 (12/10 1112) Resp:  [10-20] 12 (12/10 1112) BP: (91-107)/(58-82) 101/69 (12/10 1112) SpO2:  [89 %-100 %] 98 % (12/10 1112)  Weight change:  Filed Weights   07/07/24 0330  Weight: 83.3 kg    Intake/Output: I/O last 3 completed shifts: In: -  Out: 2650 [Urine:2650]   Intake/Output this shift:  Total I/O In: -  Out: 700 [Urine:700]  Physical Exam: General: NAD  Head: Normocephalic, atraumatic. Moist oral mucosal membranes  Eyes: Anicteric  Lungs:  Clear to auscultation, normal effort  Heart: Regular rate and rhythm  Abdomen:  Soft, nontender  Extremities: No  peripheral edema.  Neurologic: Awake, alert, conversant  Skin: Warm,dry, no rash       Basic Metabolic Panel: Recent Labs  Lab 07/07/24 0340 07/07/24 0609 07/07/24 1020 07/07/24 1630 07/07/24 2101 07/08/24 0447 07/08/24 1548 07/09/24 0611  NA 114*   < > 119* 124* 125* 128* 126* 130*  K 3.6  --   --   --   --  4.2  --  3.9  CL 79*  --   --   --   --  87*  --  88*  CO2 20*  --   --   --   --  28  --  32  GLUCOSE 205*  --   --   --   --  141*  --  114*  BUN <5*  --   --   --   --  7*  --  14  CREATININE 0.87  --  0.84  --   --  0.85  --  0.99  CALCIUM  8.7*  --   --   --   --  9.2  --  8.9   < > = values in this interval not displayed.    Liver Function Tests: Recent Labs  Lab 07/07/24 0340 07/08/24 0447  AST 17 13*  ALT 6 7  ALKPHOS 104 104  BILITOT 0.6 0.5  PROT 6.9 7.0  ALBUMIN 4.0 4.2   Recent Labs  Lab 07/07/24 0340  LIPASE 24   No results for input(s): AMMONIA in the last 168 hours.  CBC: Recent Labs  Lab 07/07/24 0340 07/07/24 1020 07/08/24 0447  WBC 12.2* 9.0 14.4*  NEUTROABS 10.2*   --   --   HGB 12.7 12.6 12.6  HCT 38.5 37.4 37.4  MCV 82.4 80.8 82.0  PLT 268 308 392    Cardiac Enzymes: No results for input(s): CKTOTAL, CKMB, CKMBINDEX, TROPONINI in the last 168 hours.  BNP: Invalid input(s): POCBNP  CBG: Recent Labs  Lab 07/07/24 1416 07/07/24 1823 07/07/24 2219 07/08/24 0815  GLUCAP 280* 220* 157* 142*    Microbiology: Results for orders placed or performed during the hospital encounter of 07/07/24  Resp panel by RT-PCR (RSV, Flu A&B, Covid) Anterior Nasal Swab     Status: None   Collection Time: 07/07/24  3:40 AM   Specimen: Anterior Nasal Swab  Result Value Ref Range Status   SARS Coronavirus 2 by RT PCR NEGATIVE NEGATIVE Final    Comment: (NOTE) SARS-CoV-2 target nucleic acids are NOT DETECTED.  The SARS-CoV-2 RNA is generally detectable in  upper respiratory specimens during the acute phase of infection. The lowest concentration of SARS-CoV-2 viral copies this assay can detect is 138 copies/mL. A negative result does not preclude SARS-Cov-2 infection and should not be used as the sole basis for treatment or other patient management decisions. A negative result may occur with  improper specimen collection/handling, submission of specimen other than nasopharyngeal swab, presence of viral mutation(s) within the areas targeted by this assay, and inadequate number of viral copies(<138 copies/mL). A negative result must be combined with clinical observations, patient history, and epidemiological information. The expected result is Negative.  Fact Sheet for Patients:  bloggercourse.com  Fact Sheet for Healthcare Providers:  seriousbroker.it  This test is no t yet approved or cleared by the [place]  FDA and  has been authorized for detection and/or diagnosis of SARS-CoV-2 by FDA under an Emergency Use Authorization (EUA). This EUA will remain  in effect (meaning this test can be  used) for the duration of the COVID-19 declaration under Section 564(b)(1) of the Act, 21 U.S.C.section 360bbb-3(b)(1), unless the authorization is terminated  or revoked sooner.       Influenza A by PCR NEGATIVE NEGATIVE Final   Influenza B by PCR NEGATIVE NEGATIVE Final    Comment: (NOTE) The Xpert Xpress SARS-CoV-2/FLU/RSV plus assay is intended as an aid in the diagnosis of influenza from Nasopharyngeal swab specimens and should not be used as a sole basis for treatment. Nasal washings and aspirates are unacceptable for Xpert Xpress SARS-CoV-2/FLU/RSV testing.  Fact Sheet for Patients: bloggercourse.com  Fact Sheet for Healthcare Providers: seriousbroker.it  This test is not yet approved or cleared by the [place]  FDA and has been authorized for detection and/or diagnosis of SARS-CoV-2 by FDA under an Emergency Use Authorization (EUA). This EUA will remain in effect (meaning this test can be used) for the duration of the COVID-19 declaration under Section 564(b)(1) of the Act, 21 U.S.C. section 360bbb-3(b)(1), unless the authorization is terminated or revoked.     Resp Syncytial Virus by PCR NEGATIVE NEGATIVE Final    Comment: (NOTE) Fact Sheet for Patients: bloggercourse.com  Fact Sheet for Healthcare Providers: seriousbroker.it  This test is not yet approved or cleared by the [place]  FDA and has been authorized for detection and/or diagnosis of SARS-CoV-2 by FDA under an Emergency Use Authorization (EUA). This EUA will remain in effect (meaning this test can be used) for the duration of the COVID-19 declaration under Section 564(b)(1) of the Act, 21 U.S.C. section 360bbb-3(b)(1), unless the authorization is terminated or revoked.  Performed at [facility], [address] [place], [place] [address]     Coagulation Studies: Recent Labs     07/08/24 0447  LABPROT 15.0  INR 1.1    Urinalysis: No results for input(s): COLORURINE, LABSPEC, PHURINE, GLUCOSEU, HGBUR, BILIRUBINUR, KETONESUR, PROTEINUR, UROBILINOGEN, NITRITE, LEUKOCYTESUR in the last 72 hours.  Invalid input(s): APPERANCEUR    Imaging: No results found.    Medications:     aspirin  EC  81 mg Oral Daily   diazepam   10 mg Oral QID   enoxaparin  (LOVENOX ) injection  40 mg Subcutaneous Q24H   feeding supplement  237 mL Oral BID BM   fluticasone   1 spray Each Nare BID   furosemide   40 mg Intravenous BID   isosorbide  mononitrate  60 mg Oral Daily   levothyroxine   25 mcg Oral Q0600   OLANZapine   5 mg Oral Daily   pantoprazole   40 mg Oral BID AC  ranolazine   500 mg Oral BID   rosuvastatin   5 mg Oral Once per day on Tuesday Thursday Saturday   senna-docusate  2 tablet Oral QHS   spironolactone   12.5 mg Oral Daily   acetaminophen  **OR** acetaminophen , ondansetron  **OR** ondansetron  (ZOFRAN ) IV, oxyCODONE , polyethylene glycol  Assessment/ Plan:  Ms. Krisi Azua is a 63 y.o.  female with medical problems of  Coronary artery disease, history of stroke, morbid obesity, congestive heart failure, hypothyroidism, blindness and chronic suprapubic catheter    was admitted on 07/07/2024 for Hyponatremia [E87.1]   Hyponatremia likely due to excessive consumption of water in the setting of chronic systolic CHF. Patient has responded well to IV furosemide    Recommendations:  We discussed with the patient regarding following strict fluid restriction and cutting back on eating ice.  Sodium 130 today Did receive hypertonic saline earlier in admission Will continue fluid restriction to about 1200 cc/day.  (Including ice) Low BUN suggest low protein intake.  Continue protein supplementation with Ensure.     LOS: 2 Julliana Whitmyer 12/10/202511:58 AM

## 2024-07-09 NOTE — Assessment & Plan Note (Signed)
 Rosuvastatin  5 mg once per day on Tuesday, Thursday, Saturday

## 2024-07-09 NOTE — Assessment & Plan Note (Signed)
 Home spironolactone  12.5 mg daily resumed Furosemide  40 mg IV twice daily

## 2024-07-09 NOTE — Hospital Course (Addendum)
 Ms. Victoria Medina is a 63 year old female with history of heart failure reduced ejection fraction, CAD, stroke, bilateral blindness, anxiety, depression, hypothyroid, morbid obesity, history of DVT, PE no longer on anticoagulation, left upper extremity contractures, frequent hospitalization, chronic suprapubic catheter.  12/8: Presents to the ED from [facility] for chief concerns of difficulty breathing and chest pain.  12/8: [facility] admitted [facility] for chief concerns of acute on chronic severe hyponatremia.  Per H&P, 3% saline was given in the ED.  Initial sodium is 114 and on repeat at that time was 119.  Patient was admitted to stepdown.  Patient was also treated for combined acute exacerbation of heart failure reduced and preserved ejection fraction. Patient was given furosemide  40 mg IV twice daily and spironolactone  p.o. was continued.  BiPAP therapy was initiated.  Oxygen supplementation to maintain SpO2 greater than 90%.  Cardiology was consulted to help manage combined heart failure reduced and preserved ejection fraction exacerbation. 12/9: Lasix  IV was continued.  Serum sodium improved to 128.  Patient was weaned off nasal cannula.  12/10: Nephrology, cardiology is following for hyponatremia.  Cardiology recommends continue furosemide  IV at this time.  Nephrology signed off as serum sodium is 130.  Continue strict I's and Os  12/11: Patient is doing much better. 12/12: Patient appears to be at baseline and cardiologist changed diuretics to p.o. 12/13: Patient is optimized on room air, Lasix  40 mg daily, spironolactone , discharge back to skilled nursing facility

## 2024-07-09 NOTE — Assessment & Plan Note (Addendum)
MOST form at bedside

## 2024-07-09 NOTE — Assessment & Plan Note (Signed)
 Suspect demargination in setting of Solu-Medrol  125 mg IV one-time dose on 07/07/2024 Do not have clinical suspicion for infectious etiology at this time Recheck CBC in a.m.

## 2024-07-09 NOTE — Assessment & Plan Note (Signed)
 Cardiology is consulted for management Furosemide  40 mg IV twice daily Continue strict I's and O's In the a.m. Home metoprolol  has not been initiated or resumed on admission, deferring to cardiology Complete echo on 06/06/2024: Estimated ejection fraction is 20 to 25%, grade 3 diastolic dysfunction

## 2024-07-09 NOTE — Assessment & Plan Note (Signed)
 Pantoprazole  40 mg p.o. twice daily before meals

## 2024-07-09 NOTE — Assessment & Plan Note (Signed)
 Serum sodium today is 130 Patient's sodium range is 129-133 in November Nephrology has signed off Recheck BMP in a.m.

## 2024-07-09 NOTE — Progress Notes (Signed)
° °  Brief Progress Note   _____________________________________________________________________________________________________________  Patient Name: Victoria Medina Patient DOB: Dec 29, 1960 Date: @TODAY @      Data: Reviewed labs, VS, notes.      Action: No action needed at this time.      Response:    _____________________________________________________________________________________________________________  [facility] RN Expeditor Sharolyn JONETTA Batman Please contact us  directly via secure chat (search for [facility]) or by calling us  at [phone] [facility]).

## 2024-07-09 NOTE — Assessment & Plan Note (Signed)
-  Levothyroxine 25 mcg daily resumed

## 2024-07-09 NOTE — Assessment & Plan Note (Signed)
 -  This complicates overall care and prognosis.

## 2024-07-09 NOTE — Progress Notes (Addendum)
 PROGRESS NOTE  Victoria Medina  FMW:[id number] DOB: Jun 23, 1961 DOA: 07/07/2024 PCP: [name], DO   Victoria Medina is a 63 year old female with history of heart failure reduced ejection fraction, CAD, stroke, bilateral blindness, anxiety, depression, hypothyroid, morbid obesity, history of DVT, PE no longer on anticoagulation, left upper extremity contractures, frequent hospitalization, chronic suprapubic catheter.  12/8: Presents to the ED from [facility] for chief concerns of difficulty breathing and chest pain.  12/8: Patient admitted to [facility] for chief concerns of acute on chronic severe hyponatremia.  Per H&P, 3% saline was given in the ED.  Initial sodium is 114 and on repeat at that time was 119.  Patient was admitted to stepdown.  Patient was also treated for combined acute exacerbation of heart failure reduced and preserved ejection fraction. Patient was given furosemide  40 mg IV twice daily and spironolactone  p.o. was continued.  BiPAP therapy was initiated.  Oxygen supplementation to maintain SpO2 greater than 90%.  Cardiology was consulted to help manage combined heart failure reduced and preserved ejection fraction exacerbation. 12/9: Lasix  IV was continued.  Serum sodium improved to 128.  Patient was weaned off nasal cannula.  12/10: I assumed care of the patient.  Nephrology, cardiology is following for hyponatremia.  Cardiology recommends continue furosemide  IV at this time.  Nephrology signed off as serum sodium is 130.  Continue strict I's and Os  Assessment & Plan:   Principal Problem:   Hyponatremia Active Problems:   Acute combined systolic and diastolic congestive heart failure (HCC)   Essential hypertension   Acquired hypothyroidism   GERD (gastroesophageal reflux disease)   Obesity, Class II, BMI 35-39.9   Obstructive sleep apnea (adult) (pediatric)   DNR (do not resuscitate)   Spastic hemiplegia of left nondominant side as late effect of  cerebral infarction (HCC)   T2DM (type 2 diabetes mellitus) (HCC)   Cortical blindness   Mixed hyperlipidemia   Leukocytosis   Suprapubic catheter (HCC)   Major depressive disorder, recurrent, moderate (HCC)   Assessment and Plan:  * Hyponatremia Serum sodium today is 130 Patient's sodium range is 129-133 in November Nephrology has signed off Recheck BMP in a.m.  Acute combined systolic and diastolic congestive heart failure [facility]) Cardiology is consulted for management Furosemide  40 mg IV twice daily Continue strict I's and O's In the a.m. Home metoprolol  has not been initiated or resumed on admission, deferring to cardiology Complete echo on 06/06/2024: Estimated ejection fraction is 20 to 25%, grade 3 diastolic dysfunction  Essential hypertension Home spironolactone  12.5 mg daily resumed Furosemide  40 mg IV twice daily  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) At baseline  DNR (do not resuscitate) MOST form at bedside  Obstructive sleep apnea (adult) (pediatric) CPAP nightly ordered  Obesity, Class II, BMI 35-39.9 This complicates overall care and prognosis.   GERD (gastroesophageal reflux disease) Pantoprazole  40 mg p.o. twice daily before meals  Acquired hypothyroidism Levothyroxine  25 mcg daily resumed  Suprapubic catheter (HCC) Present on physical exam  Leukocytosis Suspect demargination in setting of Solu-Medrol  125 mg IV one-time dose on 07/07/2024 Do not have clinical suspicion for infectious etiology at this time Recheck CBC in a.m.  Mixed hyperlipidemia Rosuvastatin  5 mg once per day on Tuesday, Thursday, Saturday  Cortical blindness At baseline  DVT prophylaxis: Enoxaparin  Code Status: DNR/DNI Family Communication: No Disposition Plan: Pending clinical course, once patient is off of IV furosemide  Level of care: Progressive  Consultants:  Nephrology, cardiology  Procedures:   No  Antimicrobials: None indicated at this time  Subjective:  At bedside, patient able to tell me her first last name, her age, location, current calendar year.  She reports she is ready to go home.  I reminded her that she is still on IV Lasix  per cardiology recommendation.  Patient states that okay she will stay tomorrow.  Objective: Vitals:   07/09/24 0745 07/09/24 1112 07/09/24 1422 07/09/24 1453  BP: 107/73 101/69 109/74 114/70  Pulse: 79 93 100 96  Resp: 20 12 15 17   Temp:  98.4 F (36.9 C)  97.7 F (36.5 C)  TempSrc:  Oral  Oral  SpO2: 95% 98% 100% 100%  Weight:      Height:        Intake/Output Summary (Last 24 hours) at 07/09/2024 1801 Last data filed at 07/09/2024 1757 Gross per 24 hour  Intake 60 ml  Output 4150 ml  Net -4090 ml   Filed Weights   07/07/24 0330  Weight: 83.3 kg   Examination:  General exam: Appears calm and comfortable  Respiratory system: Clear to auscultation. Respiratory effort normal. Cardiovascular system: S1 & S2 heard, RRR. No JVD, murmurs, rubs, gallops or clicks. No pedal edema. Gastrointestinal system: Morbid abdominal obesity, abdomen is nondistended, soft and nontender. No organomegaly or masses felt. Normal bowel sounds heard.  Suprapubic catheter in place Central nervous system: Alert and oriented. No focal neurological deficits. Extremities:  Contractures of the left upper extremity.  Diffuse weakness otherwise.  Appears to be patient's baseline. Skin: No rashes, lesions or ulcers Psychiatry: Judgement and insight appear normal. Mood & affect appropriate.   Data Reviewed: I have personally reviewed following labs and imaging studies  CBC: Recent Labs  Lab 07/07/24 0340 07/07/24 1020 07/08/24 0447  WBC 12.2* 9.0 14.4*  NEUTROABS 10.2*  --   --   HGB 12.7 12.6 12.6  HCT 38.5 37.4 37.4  MCV 82.4 80.8 82.0  PLT 268 308 392   Basic Metabolic Panel: Recent Labs  Lab 07/07/24 0340 07/07/24 0609 07/07/24 1020  07/07/24 1630 07/07/24 2101 07/08/24 0447 07/08/24 1548 07/09/24 0611  NA 114*   < > 119* 124* 125* 128* 126* 130*  K 3.6  --   --   --   --  4.2  --  3.9  CL 79*  --   --   --   --  87*  --  88*  CO2 20*  --   --   --   --  28  --  32  GLUCOSE 205*  --   --   --   --  141*  --  114*  BUN <5*  --   --   --   --  7*  --  14  CREATININE 0.87  --  0.84  --   --  0.85  --  0.99  CALCIUM  8.7*  --   --   --   --  9.2  --  8.9   < > = values in this interval not displayed.   GFR: Estimated Creatinine Clearance: 57.6 mL/min (by C-G formula based on SCr of 0.99 mg/dL).  Liver Function Tests: Recent Labs  Lab 07/07/24 0340 07/08/24 0447  AST 17 13*  ALT 6 7  ALKPHOS 104 104  BILITOT 0.6 0.5  PROT 6.9 7.0  ALBUMIN 4.0 4.2   Recent Labs  Lab 07/07/24 0340  LIPASE 24   Coagulation Profile: Recent Labs  Lab 07/08/24 0447  INR 1.1   BNP (last 3 results) Recent Labs    06/12/24 0300 07/07/24 0340  PROBNP 1,956.0* 4,983.0*   CBG: Recent Labs  Lab 07/07/24 1416 07/07/24 1823 07/07/24 2219 07/08/24 0815  GLUCAP 280* 220* 157* 142*   Thyroid Function Tests: Recent Labs    07/08/24 0447  TSH 0.976   Sepsis Labs: Recent Labs  Lab 07/07/24 0609  PROCALCITON <0.10   Recent Results (from the past 240 hours)  Resp panel by RT-PCR (RSV, Flu A&B, Covid) Anterior Nasal Swab     Status: None   Collection Time: 07/07/24  3:40 AM   Specimen: Anterior Nasal Swab  Result Value Ref Range Status   SARS Coronavirus 2 by RT PCR NEGATIVE NEGATIVE Final    Comment: (NOTE) SARS-CoV-2 target nucleic acids are NOT DETECTED.  The SARS-CoV-2 RNA is generally detectable in upper respiratory specimens during the acute phase of infection. The lowest concentration of SARS-CoV-2 viral copies this assay can detect is 138 copies/mL. A negative result does not preclude SARS-Cov-2 infection and should not be used as the sole basis for treatment or other patient management decisions. A  negative result may occur with  improper specimen collection/handling, submission of specimen other than nasopharyngeal swab, presence of viral mutation(s) within the areas targeted by this assay, and inadequate number of viral copies(<138 copies/mL). A negative result must be combined with clinical observations, patient history, and epidemiological information. The expected result is Negative.  Fact Sheet for Patients:  bloggercourse.com  Fact Sheet for Healthcare Providers:  seriousbroker.it  This test is no t yet approved or cleared by the [place]  FDA and  has been authorized for detection and/or diagnosis of SARS-CoV-2 by FDA under an Emergency Use Authorization (EUA). This EUA will remain  in effect (meaning this test can be used) for the duration of the COVID-19 declaration under Section 564(b)(1) of the Act, 21 U.S.C.section 360bbb-3(b)(1), unless the authorization is terminated  or revoked sooner.       Influenza A by PCR NEGATIVE NEGATIVE Final   Influenza B by PCR NEGATIVE NEGATIVE Final    Comment: (NOTE) The Xpert Xpress SARS-CoV-2/FLU/RSV plus assay is intended as an aid in the diagnosis of influenza from Nasopharyngeal swab specimens and should not be used as a sole basis for treatment. Nasal washings and aspirates are unacceptable for Xpert Xpress SARS-CoV-2/FLU/RSV testing.  Fact Sheet for Patients: bloggercourse.com  Fact Sheet for Healthcare Providers: seriousbroker.it  This test is not yet approved or cleared by the [place]  FDA and has been authorized for detection and/or diagnosis of SARS-CoV-2 by FDA under an Emergency Use Authorization (EUA). This EUA will remain in effect (meaning this test can be used) for the duration of the COVID-19 declaration under Section 564(b)(1) of the Act, 21 U.S.C. section 360bbb-3(b)(1), unless the authorization  is terminated or revoked.     Resp Syncytial Virus by PCR NEGATIVE NEGATIVE Final    Comment: (NOTE) Fact Sheet for Patients: bloggercourse.com  Fact Sheet for Healthcare Providers: seriousbroker.it  This test is not yet approved or cleared by the [place]  FDA and has been authorized for detection and/or diagnosis of SARS-CoV-2 by FDA under an Emergency Use Authorization (EUA). This EUA will remain in effect (meaning this test can be used) for the duration of the COVID-19 declaration under Section 564(b)(1) of the Act, 21 U.S.C. section 360bbb-3(b)(1), unless the authorization is terminated or revoked.  Performed at [facility], [address] [place],  [place] [address]     Scheduled Meds:  aspirin  EC  81 mg Oral Daily   diazepam   10 mg Oral QID   enoxaparin  (LOVENOX ) injection  40 mg Subcutaneous Q24H   feeding supplement  237 mL Oral BID BM   fluticasone   1 spray Each Nare BID   furosemide   40 mg Intravenous BID   isosorbide  mononitrate  60 mg Oral Daily   levothyroxine   25 mcg Oral Q0600   OLANZapine   5 mg Oral Daily   pantoprazole   40 mg Oral BID AC   ranolazine   500 mg Oral BID   rosuvastatin   5 mg Oral Once per day on Tuesday Thursday Saturday   senna-docusate  2 tablet Oral QHS   spironolactone   12.5 mg Oral Daily    LOS: 2 days   Time spent: 50 minutes  Dr. Sherre [facility] If 7PM-7AM, please contact night-coverage 07/09/2024, 6:01 PM

## 2024-07-09 NOTE — Assessment & Plan Note (Signed)
 CPAP nightly ordered

## 2024-07-09 NOTE — Assessment & Plan Note (Signed)
 Present on physical exam

## 2024-07-10 LAB — BASIC METABOLIC PANEL WITH GFR
Anion gap: 9 (ref 5–15)
BUN: 14 mg/dL (ref 8–23)
CO2: 37 mmol/L — ABNORMAL HIGH (ref 22–32)
Calcium: 8.8 mg/dL — ABNORMAL LOW (ref 8.9–10.3)
Chloride: 86 mmol/L — ABNORMAL LOW (ref 98–111)
Creatinine, Ser: 0.93 mg/dL (ref 0.44–1.00)
GFR, Estimated: >60 mL/min (ref >60)
Glucose, Bld: 134 mg/dL — ABNORMAL HIGH (ref 70–99)
Potassium: 4 mmol/L (ref 3.5–5.1)
Sodium: 132 mmol/L — ABNORMAL LOW (ref 135–145)

## 2024-07-10 LAB — CBC
HCT: 37.6 % (ref 36.0–46.0)
Hemoglobin: 12.2 g/dL (ref 12.0–15.0)
MCH: 27.3 pg (ref 26.0–34.0)
MCHC: 32.4 g/dL (ref 30.0–36.0)
MCV: 84.1 fL (ref 80.0–100.0)
Platelets: 411 K/uL — ABNORMAL HIGH (ref 150–400)
RBC: 4.47 MIL/uL (ref 3.87–5.11)
RDW: 15.1 % (ref 11.5–15.5)
WBC: 11.2 K/uL — ABNORMAL HIGH (ref 4.0–10.5)
nRBC: 0 % (ref 0.0–0.2)

## 2024-07-10 MED ORDER — SPIRONOLACTONE 25 MG PO TABS
25.0000 mg | ORAL_TABLET | Freq: Every day | ORAL | Status: DC
  Administered 2024-07-10 – 2024-07-12 (×3): 25 mg via ORAL
  Filled 2024-07-10 (×3): qty 1

## 2024-07-10 NOTE — Progress Notes (Signed)
 MD okay with d/c tele.

## 2024-07-10 NOTE — Progress Notes (Signed)
 [name] CLINIC CARDIOLOGY PROGRESS NOTE       Patient ID: Victoria Medina MRN: [id number] DOB/AGE: Jan 16, 1961 63 y.o.  Admit date: 07/07/2024 Referring Physician Dr. Nena Rebel Primary Physician [name], DO  Primary Cardiologist Dr. Wilburn Reason for Consultation AoCHF  HPI: Victoria Medina is a 62 y.o. female  with a past medical history of chronic HFrEF, coronary artery disease s/p multiple stents, hypertension, history of stroke with flaccid hemiaplasia, hypothyroidism, morbid obesity, blindness, chronic suprapubic catheter  who presented to the ED on 07/07/2024 for SOB. Cardiology was consulted for further evaluation.   Interval history: -Patient seen and examined this AM, resting in hospital bed laying flat.  -Endorses improvement in SOB.  -Renal function stable with great UOP yesterday.  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 U/S of lower extremity revealed DVT in L leg. CTA revealed bilateral lower lobe segmental PEs and R upper lobe segmental PE. Patient was started on lovenox  and transferred upstairs. Cardiac enzymes were negative x1. NOAC for 7 months.         History of stroke 10/09/2021   Hyperlipemia    Myocardial infarction [facility])    NSTEMI (non-ST elevated myocardial infarction) (HCC) 10/27/2013   Formatting of this note might be different from the original.  01/08/2014: LHC: 2nd Marginal: 99% (pre) to 0% (post); STENT, PROMUS PREMIER MR 2.25X12MM     12/04/2013: LHC: Distal LAD: 70% (pre) to 0% (post) CATH, MINI TREK RX 2.0X20MM STENT,  PROMUS PREMIER MR 2.25X28MM; Mid LAD: 70% (pre) to 0% (post), STENT, PROMUS PREMIER MR 2.50X12MM     10/27/2013. LHC: 3 vessel Disease, Stent x2:  Proximal LAD:   Stroke [facility])     Past Surgical History:  Procedure Laterality Date   ABDOMINAL HYSTERECTOMY     BACK SURGERY     CARDIAC CATHETERIZATION     CORONARY/GRAFT ACUTE MI REVASCULARIZATION N/A 01/25/2020   Procedure: Coronary/Graft Acute MI Revascularization;  Surgeon: [name], MD;  Location: [facility] INVASIVE CV LAB;  Service: Cardiovascular;  Laterality: N/A;   FRACTURE SURGERY     IR CATHETER TUBE CHANGE  08/10/2021   LEFT HEART CATH AND CORONARY ANGIOGRAPHY N/A 01/25/2020   Procedure: LEFT HEART CATH AND CORONARY ANGIOGRAPHY;  Surgeon: [name], MD;  Location: [facility] INVASIVE CV LAB;  Service: Cardiovascular;  Laterality: N/A;   SKIN GRAFT Right    TOTAL ABDOMINAL HYSTERECTOMY Bilateral     Medications Prior to Admission  Medication Sig Dispense Refill Last Dose/Taking   aspirin  EC 81 MG tablet Take 81 mg by mouth daily.   07/06/2024 at  9:00 AM   Cholecalciferol (VITAMIN D3) 1.25 MG (50000 UT) CAPS Take 1 capsule by mouth once a week.  every Mon for Supplement   06/30/2024   Dextromethorphan-Benzocaine (COUGH/SORE THROAT LOZENGES MT) Use as directed 1 drop in the mouth or throat every 4 (four) hours as needed.   Unknown   diazepam  (VALIUM ) 10 MG tablet Take 1 tablet (10 mg total) by mouth 4 (four) times daily. 120 tablet 0 07/06/2024   fluticasone  (FLONASE ) 50 MCG/ACT  nasal spray Place 1 spray into both nostrils 2 (two) times daily.   07/06/2024 at  8:00 PM   furosemide  (LASIX ) 40 MG tablet Take 1 tablet (40 mg total) by mouth daily. 30 tablet 0 07/06/2024 at  8:00 AM   ibuprofen  (ADVIL ) 800 MG tablet Take 800 mg by mouth 2 (two) times daily as needed for mild pain (pain score 1-3) or moderate pain (pain score 4-6).   07/01/2024   isosorbide  mononitrate (IMDUR ) 60 MG 24 hr tablet Take 60 mg by mouth daily.   07/06/2024 at   8:00 AM   lansoprazole (PREVACID) 15 MG capsule Take 15 mg by mouth 2 (two) times daily before a meal.   07/06/2024 at  4:00 PM   levothyroxine  (SYNTHROID ) 25 MCG tablet Take 25 mcg by mouth daily before breakfast.   07/06/2024 at  6:00 AM   losartan  (COZAAR ) 100 MG tablet Take 100 mg by mouth daily.   07/06/2024 at  8:00 AM   magnesium  hydroxide (MILK OF MAGNESIA) 400 MG/5ML suspension Take 5 mLs by mouth every 6 (six) hours as needed for mild constipation.   Unknown   Menthol (COUGH DROPS) 2.7 MG LOZG 1 tablet by Transmucosal route every 4 (four) hours as needed.   07/06/2024 at  9:37 AM   metoprolol  succinate (TOPROL -XL) 25 MG 24 hr tablet Take 25 mg by mouth daily.   07/06/2024 at  8:00 AM   nitroGLYCERIN  (NITROSTAT ) 0.4 MG SL tablet Place 0.4 mg under the tongue every 5 (five) minutes x 3 doses as needed for chest pain.    Unknown   OLANZapine  (ZYPREXA ) 5 MG tablet Take 5 mg by mouth daily.   07/06/2024 at  8:00 AM   ondansetron  (ZOFRAN -ODT) 4 MG disintegrating tablet Take 4 mg by mouth every 4 (four) hours as needed for nausea or vomiting.   07/06/2024 at  2:01 AM   polyethylene glycol (MIRALAX  / GLYCOLAX ) 17 g packet Take 17 g by mouth daily.   07/06/2024 at  8:00 AM   promethazine  (PHENERGAN ) 12.5 MG suppository Place 12.5 mg rectally every 6 (six) hours as needed.   07/05/2024   ranolazine  (RANEXA ) 500 MG 12 hr tablet Take 500 mg by mouth 2 (two) times daily.   07/06/2024 at  8:00 PM   rosuvastatin  (CRESTOR ) 5 MG tablet Take 5 mg by mouth daily. Every Tuesday,Thursday,Sat   06/28/2024   senna-docusate (SENOKOT-S) 8.6-50 MG tablet Take 2 tablets by mouth at bedtime.   07/06/2024 at  8:00 PM   spironolactone  (ALDACTONE ) 25 MG tablet Take 0.5 tablets (12.5 mg total) by mouth daily. 15 tablet 0 07/06/2024 at  8:00 AM   Vibegron  (GEMTESA ) 75 MG TABS Take 75 mg by mouth daily. 30 tablet 11 07/06/2024 at  8:00 AM   Social History   Socioeconomic History   Marital status: Single    Spouse name: Not on file    Number of children: Not on file   Years of education: Not on file   Highest education level: Not on file  Occupational History   Not on file  Tobacco Use   Smoking status: Never   Smokeless tobacco: Never  Vaping Use   Vaping status: Never Used  Substance and Sexual Activity   Alcohol use: Yes    Comment: occasional   Drug use: No   Sexual activity: Not Currently  Other Topics Concern   Not on file  Social History Narrative   Not on file   Social Drivers  of Health   Tobacco Use: Low Risk (07/09/2024)   Patient History    Smoking Tobacco Use: Never    Smokeless Tobacco Use: Never    Passive Exposure: Not on file  Financial Resource Strain: Not on file  Food Insecurity: No Food Insecurity (07/09/2024)   Epic    Worried About Programme Researcher, Broadcasting/film/video in the Last Year: Never true    Ran Out of Food in the Last Year: Never true  Transportation Needs: No Transportation Needs (07/09/2024)   Epic    Lack of Transportation (Medical): No    Lack of Transportation (Non-Medical): No  Physical Activity: Not on file  Stress: Not on file  Social Connections: Socially Isolated (07/09/2024)   Social Connection and Isolation Panel    Frequency of Communication with Friends and Family: Twice a week    Frequency of Social Gatherings with Friends and Family: Never    Attends Religious Services: Never    Database Administrator or Organizations: No    Attends Banker Meetings: Never    Marital Status: Never married  Intimate Partner Violence: Not At Risk (07/09/2024)   Epic    Fear of Current or Ex-Partner: No    Emotionally Abused: No    Physically Abused: No    Sexually Abused: No  Depression (PHQ2-9): Low Risk (06/10/2024)   Depression (PHQ2-9)    PHQ-2 Score: 0  Alcohol Screen: Not on file  Housing: Low Risk (07/09/2024)   Epic    Unable to Pay for Housing in the Last Year: No    Number of Times Moved in the Last Year: 0    Homeless in the Last Year: No   Utilities: Not At Risk (07/09/2024)   Epic    Threatened with loss of utilities: No  Health Literacy: Not on file    Family History  Problem Relation Age of Onset   Heart attack Mother    Hypertension Mother    Hypercholesterolemia Mother    Diabetes Mother    Stroke Father    Heart attack Father    Hypertension Father    Hypercholesterolemia Father    Diabetes Father    Diabetes Maternal Grandmother    Cancer - Other Maternal Grandmother      Vitals:   07/09/24 2325 07/10/24 0407 07/10/24 0533 07/10/24 0750  BP: 109/75 125/82  111/66  Pulse: 91 75  78  Resp: 18 18  16   Temp: 98.2 F (36.8 C) 98.2 F (36.8 C)  98.3 F (36.8 C)  TempSrc:    Oral  SpO2: 96% 98%  100%  Weight:   78.2 kg   Height:        PHYSICAL EXAM General: Chronically ill appearing female, well nourished, in no acute distress. HEENT: Normocephalic and atraumatic. Neck: No JVD.  Lungs: Normal respiratory effort on 2L [place]. Bibasilar crackles Heart: HRRR. Normal S1 and S2 without gallops or murmurs.  Abdomen: Non-distended appearing.  Msk: Normal strength and tone for age. Extremities: Warm and well perfused. No clubbing, cyanosis. No edema.  Neuro: Alert and oriented X 3. Psych: Answers questions appropriately.   Labs: Basic Metabolic Panel: Recent Labs    07/09/24 0611 07/10/24 0413  NA 130* 132*  K 3.9 4.0  CL 88* 86*  CO2 32 37*  GLUCOSE 114* 134*  BUN 14 14  CREATININE 0.99 0.93  CALCIUM  8.9 8.8*   Liver Function Tests: Recent Labs    07/08/24 0447  AST 13*  ALT  7  ALKPHOS 104  BILITOT 0.5  PROT 7.0  ALBUMIN 4.2   No results for input(s): LIPASE, AMYLASE in the last 72 hours.  CBC: Recent Labs    07/08/24 0447 07/10/24 0413  WBC 14.4* 11.2*  HGB 12.6 12.2  HCT 37.4 37.6  MCV 82.0 84.1  PLT 392 411*   Cardiac Enzymes: No results for input(s): CKTOTAL, CKMB, CKMBINDEX, TROPONINIHS in the last 72 hours. BNP: No results for input(s): BNP in the last  72 hours. D-Dimer: No results for input(s): DDIMER in the last 72 hours. Hemoglobin A1C: No results for input(s): HGBA1C in the last 72 hours. Fasting Lipid Panel: No results for input(s): CHOL, HDL, LDLCALC, TRIG, CHOLHDL, LDLDIRECT in the last 72 hours. Thyroid Function Tests: Recent Labs    07/08/24 0447  TSH 0.976   Anemia Panel: No results for input(s): VITAMINB12, FOLATE, FERRITIN, TIBC, IRON, RETICCTPCT in the last 72 hours.   Radiology: [facility] Chest Port 1 View Result Date: 07/07/2024 EXAM: 1 VIEW(S) XRAY OF THE CHEST 07/07/2024 03:59:00 AM COMPARISON: 06/12/2024 CLINICAL HISTORY: shob cp FINDINGS: LUNGS AND PLEURA: Stable diffuse mild interstitial pulmonary edema. No pleural effusion. No pneumothorax. HEART AND MEDIASTINUM: Mild cardiomegaly. Aortic atherosclerosis. BONES AND SOFT TISSUES: No acute osseous abnormality. IMPRESSION: 1. Stable diffuse mild interstitial pulmonary edema. 2. Mild cardiomegaly. Electronically signed by: Dorethia Molt MD 07/07/2024 04:08 AM EST RP Workstation: [id number]   DG Chest Port 1 View Result Date: 06/12/2024 CLINICAL DATA:  Cough EXAM: PORTABLE CHEST 1 VIEW COMPARISON:  06/05/2024 FINDINGS: Cardiac shadow is stable. Aortic calcifications are noted. Diffuse interstitial changes are seen consistent with edema. No effusion is seen. No focal infiltrate is noted. Postsurgical changes in the cervical spine are seen. IMPRESSION: Interstitial edema. Electronically Signed   By: Oneil Devonshire M.D.   On: 06/12/2024 02:06    ECHO 05/2024: 1. Left ventricular ejection fraction, by estimation, is 20 to 25%. The left ventricle has severely decreased function. The left ventricle demonstrates global hypokinesis. The left ventricular internal cavity size was moderately to severely dilated. There is moderate asymmetric left ventricular hypertrophy of the inferior and basal segments. Left ventricular diastolic parameters are consistent with  Grade III diastolic dysfunction (restrictive).   2. Right ventricular systolic function is mildly reduced. The right ventricular size is mildly enlarged.   3. The mitral valve is grossly normal. Mild mitral valve regurgitation.   4. The aortic valve is normal in structure. Aortic valve regurgitation is not visualized.   TELEMETRY (personally reviewed): sinus rhythm rate 70s  EKG (personally reviewed): NSR rate 88 bpm  Data reviewed by me 07/10/2024: last 24h vitals tele labs imaging I/O ED provider note, admission H&P, hospitalist progress note  Principal Problem:   Hyponatremia Active Problems:   T2DM (type 2 diabetes mellitus) (HCC)   Essential hypertension   Cortical blindness   Acquired hypothyroidism   GERD (gastroesophageal reflux disease)   Mixed hyperlipidemia   Obesity, Class II, BMI 35-39.9   Obstructive sleep apnea (adult) (pediatric)   Leukocytosis   Suprapubic catheter (HCC)   Major depressive disorder, recurrent, moderate (HCC)   DNR (do not resuscitate)   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)   Acute combined systolic and diastolic congestive heart failure (HCC)    ASSESSMENT AND PLAN:  Victoria Medina is a 63 y.o. female  with a past medical history of chronic HFrEF, coronary artery disease s/p multiple stents, hypertension, history of stroke with flaccid hemiaplasia, hypothyroidism,  morbid obesity, blindness, chronic suprapubic catheter  who presented to the ED on 07/07/2024 for SOB. Cardiology was consulted for further evaluation.   # Acute on chronic HFrEF # Coronary artery disease # Demand ischemia # Hypertension # Hyponatremia # Hx stroke, hemiplasia  Patient with worsening SOB, BNP elevated at 4900. Started on IV lasix  in the ED as well as 3% hypertonic saline. Echo last admission with EF 20-25%, global hypokinesis, moderate to severe LV dilation, grade III diastolic dysfunction.  -Continue IV lasix  40 mg BID.  Increase spironolactone  to 25 mg daily. Home losartan  was stopped. -Continue imdur  60 mg daily and ranexa  500 mg BID.  -Continue crestor  5 mg daily and aspirin  81 mg daily.  -Minimal and flat troponin most consistent with demand/supply mismatch and not ACS in the setting of acute heart failure exacerbation. No plan for further cardiac diagnostics.  -Nephrology following, appreciate recommendations.    This patient's plan of care was discussed and created with Dr. Ammon and he is in agreement.  Signed: Danita Bloch, PA-C  07/10/2024, 9:24 AM [facility] Cardiology

## 2024-07-10 NOTE — Plan of Care (Signed)
°  Problem: Education: Goal: Ability to describe self-care measures that may prevent or decrease complications (Diabetes Survival Skills Education) will improve 07/10/2024 0655 by Georgina Briana SQUIBB, RN Outcome: Progressing 07/10/2024 0655 by Georgina Briana SQUIBB, RN Outcome: Progressing Goal: Individualized Educational Video(s) 07/10/2024 0655 by Georgina Briana SQUIBB, RN Outcome: Progressing 07/10/2024 0655 by Georgina Briana SQUIBB, RN Outcome: Progressing   Problem: Coping: Goal: Ability to adjust to condition or change in health will improve 07/10/2024 0655 by Georgina Briana SQUIBB, RN Outcome: Progressing 07/10/2024 0655 by Georgina Briana SQUIBB, RN Outcome: Progressing   Problem: Fluid Volume: Goal: Ability to maintain a balanced intake and output will improve 07/10/2024 0655 by Georgina Briana SQUIBB, RN Outcome: Progressing 07/10/2024 0655 by Georgina Briana SQUIBB, RN Outcome: Progressing   Problem: Health Behavior/Discharge Planning: Goal: Ability to identify and utilize available resources and services will improve 07/10/2024 0655 by Georgina Briana SQUIBB, RN Outcome: Progressing 07/10/2024 0655 by Georgina Briana SQUIBB, RN Outcome: Progressing Goal: Ability to manage health-related needs will improve 07/10/2024 0655 by Georgina Briana SQUIBB, RN Outcome: Progressing 07/10/2024 0655 by Georgina Briana SQUIBB, RN Outcome: Progressing   Problem: Metabolic: Goal: Ability to maintain appropriate glucose levels will improve 07/10/2024 0655 by Georgina Briana SQUIBB, RN Outcome: Progressing 07/10/2024 0655 by Georgina Briana SQUIBB, RN Outcome: Progressing   Problem: Nutritional: Goal: Maintenance of adequate nutrition will improve 07/10/2024 0655 by Georgina Briana SQUIBB, RN Outcome: Progressing 07/10/2024 0655 by Georgina Briana SQUIBB, RN Outcome: Progressing Goal: Progress toward achieving an optimal weight will improve 07/10/2024 0655 by Georgina Briana SQUIBB, RN Outcome: Progressing 07/10/2024 0655 by Georgina Briana SQUIBB, RN Outcome:  Progressing   Problem: Skin Integrity: Goal: Risk for impaired skin integrity will decrease Outcome: Progressing   Problem: Tissue Perfusion: Goal: Adequacy of tissue perfusion will improve Outcome: Progressing   Problem: Education: Goal: Knowledge of General Education information will improve Description: Including pain rating scale, medication(s)/side effects and non-pharmacologic comfort measures Outcome: Progressing   Problem: Health Behavior/Discharge Planning: Goal: Ability to manage health-related needs will improve Outcome: Progressing   Problem: Clinical Measurements: Goal: Ability to maintain clinical measurements within normal limits will improve Outcome: Progressing Goal: Will remain free from infection Outcome: Progressing Goal: Diagnostic test results will improve Outcome: Progressing Goal: Respiratory complications will improve Outcome: Progressing Goal: Cardiovascular complication will be avoided Outcome: Progressing   Problem: Activity: Goal: Risk for activity intolerance will decrease Outcome: Progressing   Problem: Nutrition: Goal: Adequate nutrition will be maintained Outcome: Progressing   Problem: Coping: Goal: Level of anxiety will decrease Outcome: Progressing   Problem: Elimination: Goal: Will not experience complications related to bowel motility Outcome: Progressing Goal: Will not experience complications related to urinary retention Outcome: Progressing   Problem: Pain Managment: Goal: General experience of comfort will improve and/or be controlled Outcome: Progressing   Problem: Safety: Goal: Ability to remain free from injury will improve Outcome: Progressing   Problem: Skin Integrity: Goal: Risk for impaired skin integrity will decrease Outcome: Progressing   Problem: Education: Goal: Ability to demonstrate management of disease process will improve Outcome: Progressing Goal: Ability to verbalize understanding of  medication therapies will improve Outcome: Progressing Goal: Individualized Educational Video(s) Outcome: Progressing   Problem: Activity: Goal: Capacity to carry out activities will improve Outcome: Progressing   Problem: Cardiac: Goal: Ability to achieve and maintain adequate cardiopulmonary perfusion will improve Outcome: Progressing

## 2024-07-10 NOTE — Progress Notes (Signed)
 Requested SCD pump from materials. Will place once I receive pump.

## 2024-07-10 NOTE — Progress Notes (Signed)
 [facility]  ROUNDING NOTE   Subjective:   Resting quietly in bed Alert and oriented Denies pain Denies shortness of breath  Sodium 132   Objective:  Vital signs in last 24 hours:  Temp:  [97.7 F (36.5 C)-98.9 F (37.2 C)] 98 F (36.7 C) (12/11 1146) Pulse Rate:  [75-100] 84 (12/11 1146) Resp:  [15-20] 17 (12/11 1146) BP: (95-125)/(58-82) 95/58 (12/11 1146) SpO2:  [96 %-100 %] 99 % (12/11 1146) Weight:  [78.2 kg] 78.2 kg (12/11 0533)  Weight change:  Filed Weights   07/07/24 0330 07/10/24 0533  Weight: 83.3 kg 78.2 kg    Intake/Output: I/O last 3 completed shifts: In: 780 [P.O.:780] Out: 6150 [Urine:6150]   Intake/Output this shift:  Total I/O In: -  Out: 930 [Urine:930]  Physical Exam: General: NAD  Head: Normocephalic, atraumatic. Moist oral mucosal membranes  Eyes: Anicteric  Lungs:  Clear to auscultation, normal effort  Heart: Regular rate and rhythm  Abdomen:  Soft, nontender  Extremities: No peripheral edema.  Neurologic: Awake, alert, conversant  Skin: Warm,dry, no rash       Basic Metabolic Panel: Recent Labs  Lab 07/07/24 0340 07/07/24 0609 07/07/24 1020 07/07/24 1630 07/07/24 2101 07/08/24 0447 07/08/24 1548 07/09/24 0611 07/10/24 0413  NA 114*   < > 119*   < > 125* 128* 126* 130* 132*  K 3.6  --   --   --   --  4.2  --  3.9 4.0  CL 79*  --   --   --   --  87*  --  88* 86*  CO2 20*  --   --   --   --  28  --  32 37*  GLUCOSE 205*  --   --   --   --  141*  --  114* 134*  BUN <5*  --   --   --   --  7*  --  14 14  CREATININE 0.87  --  0.84  --   --  0.85  --  0.99 0.93  CALCIUM  8.7*  --   --   --   --  9.2  --  8.9 8.8*   < > = values in this interval not displayed.    Liver Function Tests: Recent Labs  Lab 07/07/24 0340 07/08/24 0447  AST 17 13*  ALT 6 7  ALKPHOS 104 104  BILITOT 0.6 0.5  PROT 6.9 7.0  ALBUMIN 4.0 4.2   Recent Labs  Lab 07/07/24 0340  LIPASE 24   No results for input(s): AMMONIA in  the last 168 hours.  CBC: Recent Labs  Lab 07/07/24 0340 07/07/24 1020 07/08/24 0447 07/10/24 0413  WBC 12.2* 9.0 14.4* 11.2*  NEUTROABS 10.2*  --   --   --   HGB 12.7 12.6 12.6 12.2  HCT 38.5 37.4 37.4 37.6  MCV 82.4 80.8 82.0 84.1  PLT 268 308 392 411*    Cardiac Enzymes: No results for input(s): CKTOTAL, CKMB, CKMBINDEX, TROPONINI in the last 168 hours.  BNP: Invalid input(s): POCBNP  CBG: Recent Labs  Lab 07/07/24 1416 07/07/24 1823 07/07/24 2219 07/08/24 0815  GLUCAP 280* 220* 157* 142*    Microbiology: Results for orders placed or performed during the hospital encounter of 07/07/24  Resp panel by RT-PCR (RSV, Flu A&B, Covid) Anterior Nasal Swab     Status: None   Collection Time: 07/07/24  3:40 AM   Specimen: Anterior Nasal Swab  Result  Value Ref Range Status   SARS Coronavirus 2 by RT PCR NEGATIVE NEGATIVE Final    Comment: (NOTE) SARS-CoV-2 target nucleic acids are NOT DETECTED.  The SARS-CoV-2 RNA is generally detectable in upper respiratory specimens during the acute phase of infection. The lowest concentration of SARS-CoV-2 viral copies this assay can detect is 138 copies/mL. A negative result does not preclude SARS-Cov-2 infection and should not be used as the sole basis for treatment or other patient management decisions. A negative result may occur with  improper specimen collection/handling, submission of specimen other than nasopharyngeal swab, presence of viral mutation(s) within the areas targeted by this assay, and inadequate number of viral copies(<138 copies/mL). A negative result must be combined with clinical observations, patient history, and epidemiological information. The expected result is Negative.  Fact Sheet for Patients:  bloggercourse.com  Fact Sheet for Healthcare Providers:  seriousbroker.it  This test is no t yet approved or cleared by the [place]  FDA and   has been authorized for detection and/or diagnosis of SARS-CoV-2 by FDA under an Emergency Use Authorization (EUA). This EUA will remain  in effect (meaning this test can be used) for the duration of the COVID-19 declaration under Section 564(b)(1) of the Act, 21 U.S.C.section 360bbb-3(b)(1), unless the authorization is terminated  or revoked sooner.       Influenza A by PCR NEGATIVE NEGATIVE Final   Influenza B by PCR NEGATIVE NEGATIVE Final    Comment: (NOTE) The Xpert Xpress SARS-CoV-2/FLU/RSV plus assay is intended as an aid in the diagnosis of influenza from Nasopharyngeal swab specimens and should not be used as a sole basis for treatment. Nasal washings and aspirates are unacceptable for Xpert Xpress SARS-CoV-2/FLU/RSV testing.  Fact Sheet for Patients: bloggercourse.com  Fact Sheet for Healthcare Providers: seriousbroker.it  This test is not yet approved or cleared by the [place]  FDA and has been authorized for detection and/or diagnosis of SARS-CoV-2 by FDA under an Emergency Use Authorization (EUA). This EUA will remain in effect (meaning this test can be used) for the duration of the COVID-19 declaration under Section 564(b)(1) of the Act, 21 U.S.C. section 360bbb-3(b)(1), unless the authorization is terminated or revoked.     Resp Syncytial Virus by PCR NEGATIVE NEGATIVE Final    Comment: (NOTE) Fact Sheet for Patients: bloggercourse.com  Fact Sheet for Healthcare Providers: seriousbroker.it  This test is not yet approved or cleared by the [place]  FDA and has been authorized for detection and/or diagnosis of SARS-CoV-2 by FDA under an Emergency Use Authorization (EUA). This EUA will remain in effect (meaning this test can be used) for the duration of the COVID-19 declaration under Section 564(b)(1) of the Act, 21 U.S.C. section 360bbb-3(b)(1),  unless the authorization is terminated or revoked.  Performed at [facility], [address] [place], [place] [address]     Coagulation Studies: Recent Labs    07/08/24 0447  LABPROT 15.0  INR 1.1    Urinalysis: No results for input(s): COLORURINE, LABSPEC, PHURINE, GLUCOSEU, HGBUR, BILIRUBINUR, KETONESUR, PROTEINUR, UROBILINOGEN, NITRITE, LEUKOCYTESUR in the last 72 hours.  Invalid input(s): APPERANCEUR    Imaging: No results found.    Medications:     aspirin  EC  81 mg Oral Daily   diazepam   10 mg Oral QID   enoxaparin  (LOVENOX ) injection  40 mg Subcutaneous Q24H   feeding supplement  237 mL Oral BID BM   fluticasone   1 spray Each Nare BID   furosemide   40 mg Intravenous BID  isosorbide  mononitrate  60 mg Oral Daily   levothyroxine   25 mcg Oral Q0600   OLANZapine   5 mg Oral Daily   pantoprazole   40 mg Oral BID AC   ranolazine   500 mg Oral BID   rosuvastatin   5 mg Oral Once per day on Tuesday Thursday Saturday   senna-docusate  2 tablet Oral QHS   spironolactone   25 mg Oral Daily   acetaminophen  **OR** acetaminophen , nitroGLYCERIN , ondansetron  **OR** ondansetron  (ZOFRAN ) IV, oxyCODONE , polyethylene glycol, sodium chloride   Assessment/ Plan:  Victoria Medina is a 63 y.o.  female with medical problems of  Coronary artery disease, history of stroke, morbid obesity, congestive heart failure, hypothyroidism, blindness and chronic suprapubic catheter    was admitted on 07/07/2024 for Hyponatremia [E87.1] SOB (shortness of breath) [R06.02] Acute on chronic congestive heart failure, unspecified heart failure type (HCC) [I50.9]   Hyponatremia likely due to excessive consumption of water in the setting of chronic systolic CHF. Patient has responded well to IV furosemide  Did receive hypertonic saline earlier in admission   Recommendations:  We discussed with the patient regarding following: strict fluid restriction and cutting back  on eating ice.  Sodium 132 Will continue fluid restriction to about 1200 cc/day.  (Including ice) BUN has improved, continue to encourage oral intake and Ensures  Due to sodium correction, we will sign off at this time.     LOS: 3 Victoria Medina 12/11/202512:08 PM

## 2024-07-10 NOTE — Care Management Important Message (Signed)
 Important Message  Patient Details  Name: Victoria Medina MRN: [id number] Date of Birth: 1961-06-30   Important Message Given:  Yes - Medicare IM     Victoria Medina 07/10/2024, 1:03 PM

## 2024-07-10 NOTE — Progress Notes (Signed)
 Progress Note   Patient: Victoria Medina FMW:[id number] DOB: 24-May-1961 DOA: 07/07/2024     3 DOS: the patient was seen and examined on 07/10/2024   Brief hospital course: Victoria Medina is a 63 year old female with history of heart failure reduced ejection fraction, CAD, stroke, bilateral blindness, anxiety, depression, hypothyroid, morbid obesity, history of DVT, PE no longer on anticoagulation, left upper extremity contractures, frequent hospitalization, chronic suprapubic catheter.  12/8: Presents to the ED from [facility] for chief concerns of difficulty breathing and chest pain.  12/8: Patient admitted to [facility] hospitalist service for chief concerns of acute on chronic severe hyponatremia.  Per H&P, 3% saline was given in the ED.  Initial sodium is 114 and on repeat at that time was 119.  Patient was admitted to stepdown.  Patient was also treated for combined acute exacerbation of heart failure reduced and preserved ejection fraction. Patient was given furosemide  40 mg IV twice daily and spironolactone  p.o. was continued.  BiPAP therapy was initiated.  Oxygen supplementation to maintain SpO2 greater than 90%.  Cardiology was consulted to help manage combined heart failure reduced and preserved ejection fraction exacerbation. 12/9: Lasix  IV was continued.  Serum sodium improved to 128.  Patient was weaned off nasal cannula.  12/10: Nephrology, cardiology is following for hyponatremia.  Cardiology recommends continue furosemide  IV at this time.  Nephrology signed off as serum sodium is 130.  Continue strict I's and Os  12/11: Patient is doing much better.  May go back to nursing home in 1 to 2 days  Assessment and Plan:  Hyponatremia Serum sodium today is 132 Patient's sodium range is 129-133 in November Nephrology has signed off Recheck BMP in a.m.  Acute combined systolic and diastolic congestive heart failure [facility]) Cardiology is consulted for management Furosemide  40 mg IV twice  daily Continue strict I's and O's In the a.m. Home metoprolol  has not been initiated or resumed on admission, deferring to cardiology Complete echo on 06/06/2024: Estimated ejection fraction is 20 to 25%, grade 3 diastolic dysfunction  Essential hypertension Home spironolactone  12.5 mg daily resumed Imdur  has been added 60 mg oral daily 12/11 Furosemide  40 mg IV twice daily  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) At baseline  DNR (do not resuscitate) MOST form at bedside  Obstructive sleep apnea (adult) (pediatric) CPAP nightly ordered  Obesity, Class II, BMI 35-39.9 This complicates overall care and prognosis.   GERD (gastroesophageal reflux disease) Pantoprazole  40 mg p.o. twice daily before meals  Acquired hypothyroidism Levothyroxine  25 mcg daily resumed  Suprapubic catheter (HCC) Present on physical exam  Leukocytosis Suspect demargination in setting of Solu-Medrol  125 mg IV one-time dose on 07/07/2024 Do not have clinical suspicion for infectious etiology at this time Recheck CBC in a.m.  Mixed hyperlipidemia Rosuvastatin  5 mg once per day on Tuesday, Thursday, Saturday  Cortical blindness At baseline       Subjective: Denies any nausea vomiting chest pain or shortness of breath  Physical Exam: Vitals:   07/10/24 0533 07/10/24 0750 07/10/24 1146 07/10/24 1519  BP:  111/66 (!) 95/58 103/67  Pulse:  78 84 82  Resp:  16 17 18   Temp:  98.3 F (36.8 C) 98 F (36.7 C) 98.3 F (36.8 C)  TempSrc:  Oral Oral Oral  SpO2:  100% 99% 99%  Weight: 78.2 kg     Height:       Seen and examined at bedside Alert awake and oriented  HEENT: Neck supple Lungs: Bilateral decreased air entry at the bases with bilateral fine rales Heart regular S1-S2 Abdomen: Soft bowel sounds are present Extremities: Contracted left side Neurological: Alert awake and oriented, left-sided hemiparesis  Data Reviewed:  WBC  11.2  Family Communication: None available  Disposition: Status is: Inpatient Remains inpatient appropriate because: Ongoing recovery from hyponatremia and congestive heart failure  Planned Discharge Destination: Skilled nursing facility    Time spent: 35 minutes  Author: Nena Rebel, MD 07/10/2024 3:29 PM  For on call review [email or web address].

## 2024-07-11 DIAGNOSIS — E871 Hypo-osmolality and hyponatremia: Secondary | ICD-10-CM | POA: Diagnosis not present

## 2024-07-11 LAB — BASIC METABOLIC PANEL WITH GFR
Anion gap: 8 (ref 5–15)
BUN: 15 mg/dL (ref 8–23)
CO2: 37 mmol/L — ABNORMAL HIGH (ref 22–32)
Calcium: 9.7 mg/dL (ref 8.9–10.3)
Chloride: 85 mmol/L — ABNORMAL LOW (ref 98–111)
Creatinine, Ser: 0.89 mg/dL (ref 0.44–1.00)
GFR, Estimated: >60 mL/min (ref >60)
Glucose, Bld: 148 mg/dL — ABNORMAL HIGH (ref 70–99)
Potassium: 3.7 mmol/L (ref 3.5–5.1)
Sodium: 130 mmol/L — ABNORMAL LOW (ref 135–145)

## 2024-07-11 LAB — CBC
HCT: 36.5 % (ref 36.0–46.0)
Hemoglobin: 12 g/dL (ref 12.0–15.0)
MCH: 27.5 pg (ref 26.0–34.0)
MCHC: 32.9 g/dL (ref 30.0–36.0)
MCV: 83.7 fL (ref 80.0–100.0)
Platelets: 375 K/uL (ref 150–400)
RBC: 4.36 MIL/uL (ref 3.87–5.11)
RDW: 14.6 % (ref 11.5–15.5)
WBC: 11.8 K/uL — ABNORMAL HIGH (ref 4.0–10.5)
nRBC: 0 % (ref 0.0–0.2)

## 2024-07-11 MED ORDER — BISACODYL 10 MG RE SUPP
10.0000 mg | Freq: Every day | RECTAL | Status: DC | PRN
  Administered 2024-07-11: 10 mg via RECTAL
  Filled 2024-07-11: qty 1

## 2024-07-11 MED ADMIN — Furosemide Tab 40 MG: 40 mg | ORAL | NDC 00054829925

## 2024-07-11 MED FILL — Furosemide Tab 40 MG: 40.0000 mg | ORAL | Qty: 1 | Status: AC

## 2024-07-11 NOTE — Progress Notes (Signed)
 [facility] Cardiology  SUBJECTIVE: Patient laying in bed, denies chest pain or shortness of breath   Vitals:   07/11/24 0016 07/11/24 0322 07/11/24 0514 07/11/24 0745  BP: 111/74 107/70  110/73  Pulse: 93 85  86  Resp: 18 18  18   Temp: 98.2 F (36.8 C) 98.3 F (36.8 C)  98 F (36.7 C)  TempSrc:    Oral  SpO2: 95% 91%  90%  Weight:   80 kg   Height:         Intake/Output Summary (Last 24 hours) at 07/11/2024 9175 Last data filed at 07/10/2024 1935 Gross per 24 hour  Intake 0 ml  Output 2730 ml  Net -2730 ml      PHYSICAL EXAM  General: Well developed, well nourished, in no acute distress HEENT:  Normocephalic and atramatic Neck:  No JVD.  Lungs: Clear bilaterally to auscultation and percussion. Heart: HRRR . Normal S1 and S2 without gallops or murmurs.  Abdomen: Bowel sounds are positive, abdomen soft and non-tender  Msk:  Back normal, normal gait. Normal strength and tone for age. Extremities: No clubbing, cyanosis or edema.   Neuro: Alert and oriented X 3. Psych:  Good affect, responds appropriately   LABS: Basic Metabolic Panel: Recent Labs    07/10/24 0413 07/11/24 0358  NA 132* 130*  K 4.0 3.7  CL 86* 85*  CO2 37* 37*  GLUCOSE 134* 148*  BUN 14 15  CREATININE 0.93 0.89  CALCIUM  8.8* 9.7   Liver Function Tests: No results for input(s): AST, ALT, ALKPHOS, BILITOT, PROT, ALBUMIN in the last 72 hours. No results for input(s): LIPASE, AMYLASE in the last 72 hours. CBC: Recent Labs    07/10/24 0413 07/11/24 0358  WBC 11.2* 11.8*  HGB 12.2 12.0  HCT 37.6 36.5  MCV 84.1 83.7  PLT 411* 375   Cardiac Enzymes: No results for input(s): CKTOTAL, CKMB, CKMBINDEX, TROPONINI in the last 72 hours. BNP: Invalid input(s): POCBNP D-Dimer: No results for input(s): DDIMER in the last 72 hours. Hemoglobin A1C: No results for input(s): HGBA1C in the last 72 hours. Fasting Lipid Panel: No results for input(s): CHOL, HDL,  LDLCALC, TRIG, CHOLHDL, LDLDIRECT in the last 72 hours. Thyroid Function Tests: No results for input(s): TSH, T4TOTAL, T3FREE, THYROIDAB in the last 72 hours.  Invalid input(s): FREET3 Anemia Panel: No results for input(s): VITAMINB12, FOLATE, FERRITIN, TIBC, IRON, RETICCTPCT in the last 72 hours.  No results found.   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  Acute on chronic HFrEF, Niccoli improved after diuresis, on furosemide  40 mg IV twice daily, and good medical management (isosorbide  mononitrate, furosemide , spironolactone ) 2.  CAD, DES mid left circumflex 01/25/2020, on isosorbide  mononitrate and Ranexa  3.  Ischemic cardiomyopathy 4.  History of CVA 5.  Blindness  Recommendations  1.  Agree with current therapy 2.  Continue diuresis 3.  Carefully monitor renal status 4.  Continue isosorbide  mononitrate and Ranexa  for chronic stable angina   Marsa Dooms, MD, PhD, FACC 07/11/2024 8:24 AM

## 2024-07-11 NOTE — Progress Notes (Signed)
 Progress Note   Patient: Victoria Medina FMW:[id number] DOB: Apr 26, 1961 DOA: 07/07/2024     4 DOS: the patient was seen and examined on 07/11/2024   Brief hospital course: [name] is a 63 year old female with history of heart failure reduced ejection fraction, CAD, stroke, bilateral blindness, anxiety, depression, hypothyroid, morbid obesity, history of DVT, PE no longer on anticoagulation, left upper extremity contractures, frequent hospitalization, chronic suprapubic catheter.  12/8: Presents to the ED from [facility] for chief concerns of difficulty breathing and chest pain.  12/8: Patient admitted to [facility] hospitalist service for chief concerns of acute on chronic severe hyponatremia.  Per H&P, 3% saline was given in the ED.  Initial sodium is 114 and on repeat at that time was 119.  Patient was admitted to stepdown.  Patient was also treated for combined acute exacerbation of heart failure reduced and preserved ejection fraction. Patient was given furosemide  40 mg IV twice daily and spironolactone  p.o. was continued.  BiPAP therapy was initiated.  Oxygen supplementation to maintain SpO2 greater than 90%.  Cardiology was consulted to help manage combined heart failure reduced and preserved ejection fraction exacerbation. 12/9: Lasix  IV was continued.  Serum sodium improved to 128.  Patient was weaned off nasal cannula.  12/10: Nephrology, cardiology is following for hyponatremia.  Cardiology recommends continue furosemide  IV at this time.  Nephrology signed off as serum sodium is 130.  Continue strict I's and Os  12/11: Patient is doing much better.  May go back to nursing home in 1 to 2 days  Assessment and Plan:  Hyponatremia Serum sodium today is 133-->130 Patient's sodium range is 129-133 in November Nephrology has signed off Recheck BMP in a.m.   Acute combined systolic and diastolic congestive heart failure [facility]) Cardiology is consulted for management Furosemide  40 mg  changed to PO daily due to metabolic alkalosis Continue strict I's and O's In the a.m. Home metoprolol  has not been initiated or resumed on admission, deferring to cardiology Complete echo on 06/06/2024: Estimated ejection fraction is 20 to 25%, grade 3 diastolic dysfunction   Essential hypertension Home spironolactone  12.5 mg daily resumed Imdur  has been added 60 mg oral daily 12/11 Furosemide  40 mg IV twice daily   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) At baseline   DNR (do not resuscitate) MOST form at bedside   Obstructive sleep apnea (adult) (pediatric) CPAP nightly ordered   Obesity, Class II, BMI 35-39.9 This complicates overall care and prognosis.    GERD (gastroesophageal reflux disease) Pantoprazole  40 mg p.o. twice daily before meals   Acquired hypothyroidism Levothyroxine  25 mcg daily resumed   Suprapubic catheter (HCC) Present on physical exam   Leukocytosis Suspect demargination in setting of Solu-Medrol  125 mg IV one-time dose on 07/07/2024 Do not have clinical suspicion for infectious etiology at this time Recheck CBC in a.m.   Mixed hyperlipidemia Rosuvastatin  5 mg once per day on Tuesday, Thursday, Saturday   Cortical blindness At baseline       Subjective: Feels better  Physical Exam: Vitals:   07/11/24 0514 07/11/24 0745 07/11/24 1144 07/11/24 1514  BP:  110/73 131/82 136/85  Pulse:  86 61 89  Resp:  18 18 18   Temp:  98 F (36.7 C) (!) 97.5 F (36.4 C) 98.3 F (36.8 C)  TempSrc:  Oral Oral Oral  SpO2:  90% 96% 100%  Weight: 80 kg     Height:  Constitutional: Alert, awake, calm, comfortable HEENT: Neck supple Respiratory: Clear to auscultation B/L, no wheezing, no rales.  Cardiovascular: Regular rate and rhythm, no murmurs / rubs / gallops. No extremity edema. 2+ pedal pulses. No carotid bruits.  Abdomen: Soft, no tenderness, Bowel sounds positive.  Musculoskeletal: no clubbing  / cyanosis. Good ROM, no contractures. Normal muscle tone.  Skin: no rashes, lesions, ulcers. Neurologic: CN 2-12 grossly intact. Sensation intact, No focal deficit identified Psychiatric: Alert and oriented x 3. Normal mood.    Data Reviewed:  Sodium 130  Family Communication: None available  Disposition: Status is: Inpatient Remains inpatient appropriate because: Recovery from hyponatremia and congestive heart failure  Planned Discharge Destination: Skilled nursing facility    Time spent: 35 minutes  Author: Nena Rebel, MD 07/11/2024 4:27 PM  For on call review [email or web address].

## 2024-07-11 NOTE — Plan of Care (Signed)

## 2024-07-12 LAB — BASIC METABOLIC PANEL WITH GFR
Anion gap: 11 (ref 5–15)
BUN: 15 mg/dL (ref 8–23)
CO2: 35 mmol/L — ABNORMAL HIGH (ref 22–32)
Calcium: 8.9 mg/dL (ref 8.9–10.3)
Chloride: 85 mmol/L — ABNORMAL LOW (ref 98–111)
Creatinine, Ser: 0.88 mg/dL (ref 0.44–1.00)
GFR, Estimated: >60 mL/min (ref >60)
Glucose, Bld: 145 mg/dL — ABNORMAL HIGH (ref 70–99)
Potassium: 3.5 mmol/L (ref 3.5–5.1)
Sodium: 132 mmol/L — ABNORMAL LOW (ref 135–145)

## 2024-07-12 MED ORDER — PANTOPRAZOLE SODIUM 40 MG PO TBEC: 40.0000 mg | DELAYED_RELEASE_TABLET | Freq: Two times a day (BID) | ORAL | 0 refills | Status: DC

## 2024-07-12 MED ADMIN — Furosemide Tab 40 MG: 40 mg | ORAL | NDC 00054829925

## 2024-07-12 NOTE — Progress Notes (Signed)
 [facility] Cardiology  SUBJECTIVE: Patient laying in bed, denies chest pain or shortness of breath   Vitals:   07/11/24 1514 07/11/24 2012 07/11/24 2326 07/12/24 0457  BP: 136/85 113/72 107/75   Pulse: 89 (!) 104 90   Resp: 18 20 18    Temp: 98.3 F (36.8 C) 97.8 F (36.6 C) 98.5 F (36.9 C)   TempSrc: Oral     SpO2: 100% 96% 93%   Weight:    77.2 kg  Height:         Intake/Output Summary (Last 24 hours) at 07/12/2024 0915 Last data filed at 07/12/2024 9188 Gross per 24 hour  Intake 0 ml  Output 81549 ml  Net -18450 ml      PHYSICAL EXAM  General: Well developed, well nourished, in no acute distress HEENT:  Normocephalic and atramatic Neck:  No JVD.  Lungs: Clear bilaterally to auscultation and percussion. Heart: HRRR . Normal S1 and S2 without gallops or murmurs.  Abdomen: Bowel sounds are positive, abdomen soft and non-tender  Msk:  Back normal, normal gait. Normal strength and tone for age. Extremities: No clubbing, cyanosis or edema.   Neuro: Alert and oriented X 3. Psych:  Good affect, responds appropriately   LABS: Basic Metabolic Panel: Recent Labs    07/11/24 0358 07/12/24 0404  NA 130* 132*  K 3.7 3.5  CL 85* 85*  CO2 37* 35*  GLUCOSE 148* 145*  BUN 15 15  CREATININE 0.89 0.88  CALCIUM  9.7 8.9   Liver Function Tests: No results for input(s): AST, ALT, ALKPHOS, BILITOT, PROT, ALBUMIN in the last 72 hours. No results for input(s): LIPASE, AMYLASE in the last 72 hours. CBC: Recent Labs    07/10/24 0413 07/11/24 0358  WBC 11.2* 11.8*  HGB 12.2 12.0  HCT 37.6 36.5  MCV 84.1 83.7  PLT 411* 375   Cardiac Enzymes: No results for input(s): CKTOTAL, CKMB, CKMBINDEX, TROPONINI in the last 72 hours. BNP: Invalid input(s): POCBNP D-Dimer: No results for input(s): DDIMER in the last 72 hours. Hemoglobin A1C: No results for input(s): HGBA1C in the last 72 hours. Fasting Lipid Panel: No results for input(s): CHOL,  HDL, LDLCALC, TRIG, CHOLHDL, LDLDIRECT in the last 72 hours. Thyroid Function Tests: No results for input(s): TSH, T4TOTAL, T3FREE, THYROIDAB in the last 72 hours.  Invalid input(s): FREET3 Anemia Panel: No results for input(s): VITAMINB12, FOLATE, FERRITIN, TIBC, IRON, RETICCTPCT in the last 72 hours.  No results found.   Echo LVEF 20 to 25%  TELEMETRY: :  ASSESSMENT AND PLAN:  Principal Problem:   Hyponatremia Active Problems:   T2DM (type 2 diabetes mellitus) (HCC)   Essential hypertension   Cortical blindness   Acquired hypothyroidism   GERD (gastroesophageal reflux disease)   Mixed hyperlipidemia   Obesity, Class II, BMI 35-39.9   Obstructive sleep apnea (adult) (pediatric)   Leukocytosis   Suprapubic catheter (HCC)   Major depressive disorder, recurrent, moderate (HCC)   DNR (do not resuscitate)   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)   Acute combined systolic and diastolic congestive heart failure (HCC)    1. Acute on chronic HFrEF, clinically improved after diuresis, on furosemide  40 mg IV twice daily, now on furosemide  40 mg daily, on good medical management (isosorbide  mononitrate, furosemide , spironolactone ) 2.  CAD, DES mid left circumflex 01/25/2020, on isosorbide  mononitrate and Ranexa  3.  Ischemic cardiomyopathy 4.  History of CVA 5.  Blindness   Recommendations   1.  Agree with current therapy 2.  Continue diuresis 3.  Carefully monitor renal status 4.  Continue isosorbide  mononitrate and Ranexa  for chronic stable angina 5.  May discharge home on furosemide  40 mg QD 6.  Follow-up Dr.Alluri   Marsa Dooms, MD, PhD, FACC 07/12/2024 9:15 AM

## 2024-07-12 NOTE — Progress Notes (Signed)
 Report given to Vlana, receiving nurse at [facility].

## 2024-07-12 NOTE — Discharge Summary (Signed)
 Physician Discharge Summary   Patient: Victoria Medina MRN: [id number] DOB: 03-09-1961  Admit date:     07/07/2024  Discharge date: 07/12/2024  Discharge Physician: Nena Rebel   PCP: [name], DO   Recommendations at discharge:   Continued taking medication that has been prescribed including diuretics. Fluid restriction to 1.2 L per 24 hours Continue Lasix  40 mg p.o. daily and spironolactone  12.5 mg daily Follow-up with Cardiologist within 2 to 4 weeks  Discharge Diagnoses: Principal Problem:   Hyponatremia Active Problems:   Acute combined systolic and diastolic congestive heart failure (HCC)   Essential hypertension   Acquired hypothyroidism   GERD (gastroesophageal reflux disease)   Obesity, Class II, BMI 35-39.9   Obstructive sleep apnea (adult) (pediatric)   DNR (do not resuscitate)   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)   T2DM (type 2 diabetes mellitus) (HCC)   Cortical blindness   Mixed hyperlipidemia   Leukocytosis   Suprapubic catheter (HCC)   Major depressive disorder, recurrent, moderate (HCC)  Resolved Problems:   * No resolved hospital problems. *  Hospital Course: Victoria Medina is a 63 year old female with history of heart failure reduced ejection fraction, CAD, stroke, bilateral blindness, anxiety, depression, hypothyroid, morbid obesity, history of DVT, PE no longer on anticoagulation, left upper extremity contractures, frequent hospitalization, chronic suprapubic catheter.  12/8: Presents to the ED from [facility] for chief concerns of difficulty breathing and chest pain.  12/8: [facility] admitted to [facility] for chief concerns of acute on chronic severe hyponatremia.  Per H&P, 3% saline was given in the ED.  Initial sodium is 114 and on repeat at that time was 119.  Patient was admitted to stepdown.  Patient was also treated for combined acute exacerbation of heart failure reduced and  preserved ejection fraction. Patient was given furosemide  40 mg IV twice daily and spironolactone  p.o. was continued.  BiPAP therapy was initiated.  Oxygen supplementation to maintain SpO2 greater than 90%.  Cardiology was consulted to help manage combined heart failure reduced and preserved ejection fraction exacerbation. 12/9: Lasix  IV was continued.  Serum sodium improved to 128.  Patient was weaned off nasal cannula.  12/10: Nephrology, cardiology is following for hyponatremia.  Cardiology recommends continue furosemide  IV at this time.  Nephrology signed off as serum sodium is 130.  Continue strict I's and Os  12/11: Patient is doing much better. 12/12: Patient appears to be at baseline and cardiologist changed diuretics to p.o. 12/13: Patient is optimized on room air, Lasix  40 mg daily, spironolactone , discharge back to skilled nursing facility  Assessment and Plan: Hyponatremia Serum sodium today is 133-->130-->132 12/13 Patient's sodium range is 129-133 in November Nephrology has signed off Recheck BMP weekly at the nursing home Fluid restriction to 1.2 L per 24 hours   Acute combined systolic and diastolic congestive heart failure [facility]) Cardiology is consulted for management Continue furosemide  40 mg changed to PO daily Follow-up with cardiology in 2 to 4 weeks  Continue home medications for CHF including metoprolol  Ejection fraction is 20 to 25%, grade 3 diastolic dysfunction   Essential hypertension Blood pressure has been stable continue home spironolactone  12.5 mg, Imdur , Lasix  40 mg p.o. daily.   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    GERD (gastroesophageal reflux disease) Pantoprazole  40 mg p.o. twice daily  before meals   Acquired hypothyroidism Levothyroxine  25 mcg daily resumed   Suprapubic catheter (HCC) Present on physical exam   Leukocytosis Leukocytosis at 11.8, history of Solu-Medrol  use No fever, no signs of further infection at this point. Continue to monitor as outpatient   Mixed hyperlipidemia Rosuvastatin  5 mg once per day on Tuesday, Thursday, Saturday   Cortical blindness At baseline        Pain control - [place]  Controlled Substance Reporting System database was reviewed. and patient was instructed, not to drive, operate heavy machinery, perform activities at heights, swimming or participation in water activities or provide baby-sitting services while on Pain, Sleep and Anxiety Medications; until their outpatient Physician has advised to do so again. Also recommended to not to take more than prescribed Pain, Sleep and Anxiety Medications.  Consultants: Cardiology Procedures performed: None Disposition: Skilled nursing facility Diet recommendation:  Cardiac diet DISCHARGE MEDICATION: Allergies as of 07/12/2024       Reactions   Levaquin [levofloxacin In D5w] Anaphylaxis, Swelling   Iodinated Contrast Media Itching   Severe itching   Other    Dust mites and opiates (all of them)   Latex Dermatitis        Medication List     TAKE these medications    aspirin  EC 81 MG tablet Take 81 mg by mouth daily.   Cough Drops 2.7 MG Lozg Generic drug: Menthol 1 tablet by Transmucosal route every 4 (four) hours as needed.   COUGH/SORE THROAT LOZENGES MT Use as directed 1 drop in the mouth or throat every 4 (four) hours as needed.   diazepam  10 MG tablet Commonly known as: VALIUM  Take 1 tablet (10 mg total) by mouth 4 (four) times daily.   fluticasone  50 MCG/ACT nasal spray Commonly known as: FLONASE  Place 1 spray into both nostrils 2 (two) times daily.   furosemide  40 MG tablet Commonly known as: LASIX  Take 1 tablet (40 mg total) by mouth  daily.   Gemtesa  75 MG Tabs Generic drug: Vibegron  Take 75 mg by mouth daily.   ibuprofen  800 MG tablet Commonly known as: ADVIL  Take 800 mg by mouth 2 (two) times daily as needed for mild pain (pain score 1-3) or moderate pain (pain score 4-6).   isosorbide  mononitrate 60 MG 24 hr tablet Commonly known as: IMDUR  Take 60 mg by mouth daily.   lansoprazole 15 MG capsule Commonly known as: PREVACID Take 15 mg by mouth 2 (two) times daily before a meal.   levothyroxine  25 MCG tablet Commonly known as: SYNTHROID  Take 25 mcg by mouth daily before breakfast.   losartan  100 MG tablet Commonly known as: COZAAR  Take 100 mg by mouth daily.   magnesium  hydroxide 400 MG/5ML suspension Commonly known as: MILK OF MAGNESIA Take 5 mLs by mouth every 6 (six) hours as needed for mild constipation.   metoprolol  succinate 25 MG 24 hr tablet Commonly known as: TOPROL -XL Take 25 mg by mouth daily.   nitroGLYCERIN  0.4 MG SL tablet Commonly known as: NITROSTAT  Place 0.4 mg under the tongue every 5 (five) minutes x 3 doses as needed for chest pain.   OLANZapine  5 MG tablet Commonly known as: ZYPREXA  Take 5 mg by mouth daily.   ondansetron  4 MG disintegrating tablet Commonly known as: ZOFRAN -ODT Take 4 mg by mouth every 4 (four) hours as needed for nausea or vomiting.  polyethylene glycol 17 g packet Commonly known as: MIRALAX  / GLYCOLAX  Take 17 g by mouth daily.   promethazine  12.5 MG suppository Commonly known as: PHENERGAN  Place 12.5 mg rectally every 6 (six) hours as needed.   ranolazine  500 MG 12 hr tablet Commonly known as: RANEXA  Take 500 mg by mouth 2 (two) times daily.   rosuvastatin  5 MG tablet Commonly known as: CRESTOR  Take 5 mg by mouth daily. Every Tuesday,Thursday,Sat   senna-docusate 8.6-50 MG tablet Commonly known as: Senokot-S Take 2 tablets by mouth at bedtime.   spironolactone  25 MG tablet Commonly known as: ALDACTONE  Take 0.5 tablets (12.5 mg total) by  mouth daily.   Vitamin D3 1.25 MG (50000 UT) Caps Take 1 capsule by mouth once a week.  every Mon for Supplement        Follow-up Information     Alluri, Keller BROCKS, MD. Go in 1 week(s).   Specialty: Cardiology Why: Appointment scheduled for 07/17/24 at 10:15 AM Contact information: [address] [place] [address] [phone]                Discharge Exam: Fredricka Weights   07/10/24 0533 07/11/24 0514 07/12/24 0457  Weight: 78.2 kg 80 kg 77.2 kg   Seen and examined at bedside Constitutional: Alert, awake, calm, comfortable HEENT: Neck supple Respiratory: Clear to auscultation B/L, no wheezing, no rales.  Cardiovascular: Regular rate and rhythm, no murmurs / rubs / gallops. No extremity edema. 2+ pedal pulses. No carotid bruits.  Abdomen: Soft, no tenderness, Bowel sounds positive.  Musculoskeletal: Left upper extremity contracture.  Skin: no rashes, lesions, ulcers. Neurologic: She is alert awake and oriented, left upper extremity contracture, no new focal neurological deficit identified Psychiatric: Alert and oriented x 3. Normal mood.     Condition at discharge: good  The results of significant diagnostics from this hospitalization (including imaging, microbiology, ancillary and laboratory) are listed below for reference.   Imaging Studies: DG Chest Port 1 View Result Date: 07/07/2024 EXAM: 1 VIEW(S) XRAY OF THE CHEST 07/07/2024 03:59:00 AM COMPARISON: 06/12/2024 CLINICAL HISTORY: shob cp FINDINGS: LUNGS AND PLEURA: Stable diffuse mild interstitial pulmonary edema. No pleural effusion. No pneumothorax. HEART AND MEDIASTINUM: Mild cardiomegaly. Aortic atherosclerosis. BONES AND SOFT TISSUES: No acute osseous abnormality. IMPRESSION: 1. Stable diffuse mild interstitial pulmonary edema. 2. Mild cardiomegaly. Electronically signed by: Dorethia Molt MD 07/07/2024 04:08 AM EST RP Workstation: [id number]    Microbiology: Results for orders placed or performed  during the hospital encounter of 07/07/24  Resp panel by RT-PCR (RSV, Flu A&B, Covid) Anterior Nasal Swab     Status: None   Collection Time: 07/07/24  3:40 AM   Specimen: Anterior Nasal Swab  Result Value Ref Range Status   SARS Coronavirus 2 by RT PCR NEGATIVE NEGATIVE Final    Comment: (NOTE) SARS-CoV-2 target nucleic acids are NOT DETECTED.  The SARS-CoV-2 RNA is generally detectable in upper respiratory specimens during the acute phase of infection. The lowest concentration of SARS-CoV-2 viral copies this assay can detect is 138 copies/mL. A negative result does not preclude SARS-Cov-2 infection and should not be used as the sole basis for treatment or other patient management decisions. A negative result may occur with  improper specimen collection/handling, submission of specimen other than nasopharyngeal swab, presence of viral mutation(s) within the areas targeted by this assay, and inadequate number of viral copies(<138 copies/mL). A negative result must be combined with clinical observations, patient history, and epidemiological information. The expected result is Negative.  Fact Sheet for Patients:  bloggercourse.com  Fact Sheet for Healthcare Providers:  seriousbroker.it  This test is no t yet approved or cleared by the [place]  FDA and  has been authorized for detection and/or diagnosis of SARS-CoV-2 by FDA under an Emergency Use Authorization (EUA). This EUA will remain  in effect (meaning this test can be used) for the duration of the COVID-19 declaration under Section 564(b)(1) of the Act, 21 U.S.C.section 360bbb-3(b)(1), unless the authorization is terminated  or revoked sooner.       Influenza A by PCR NEGATIVE NEGATIVE Final   Influenza B by PCR NEGATIVE NEGATIVE Final    Comment: (NOTE) The Xpert Xpress SARS-CoV-2/FLU/RSV plus assay is intended as an aid in the diagnosis of influenza from  Nasopharyngeal swab specimens and should not be used as a sole basis for treatment. Nasal washings and aspirates are unacceptable for Xpert Xpress SARS-CoV-2/FLU/RSV testing.  Fact Sheet for Patients: bloggercourse.com  Fact Sheet for Healthcare Providers: seriousbroker.it  This test is not yet approved or cleared by the [place]  FDA and has been authorized for detection and/or diagnosis of SARS-CoV-2 by FDA under an Emergency Use Authorization (EUA). This EUA will remain in effect (meaning this test can be used) for the duration of the COVID-19 declaration under Section 564(b)(1) of the Act, 21 U.S.C. section 360bbb-3(b)(1), unless the authorization is terminated or revoked.     Resp Syncytial Virus by PCR NEGATIVE NEGATIVE Final    Comment: (NOTE) Fact Sheet for Patients: bloggercourse.com  Fact Sheet for Healthcare Providers: seriousbroker.it  This test is not yet approved or cleared by the [place]  FDA and has been authorized for detection and/or diagnosis of SARS-CoV-2 by FDA under an Emergency Use Authorization (EUA). This EUA will remain in effect (meaning this test can be used) for the duration of the COVID-19 declaration under Section 564(b)(1) of the Act, 21 U.S.C. section 360bbb-3(b)(1), unless the authorization is terminated or revoked.  Performed at [facility], [address] [place], [place] [address]     Labs: CBC: Recent Labs  Lab 07/07/24 0340 07/07/24 1020 07/08/24 0447 07/10/24 0413 07/11/24 0358  WBC 12.2* 9.0 14.4* 11.2* 11.8*  NEUTROABS 10.2*  --   --   --   --   HGB 12.7 12.6 12.6 12.2 12.0  HCT 38.5 37.4 37.4 37.6 36.5  MCV 82.4 80.8 82.0 84.1 83.7  PLT 268 308 392 411* 375   Basic Metabolic Panel: Recent Labs  Lab 07/08/24 0447 07/08/24 1548 07/09/24 0611 07/10/24 0413 07/11/24 0358 07/12/24 0404  NA 128*  126* 130* 132* 130* 132*  K 4.2  --  3.9 4.0 3.7 3.5  CL 87*  --  88* 86* 85* 85*  CO2 28  --  32 37* 37* 35*  GLUCOSE 141*  --  114* 134* 148* 145*  BUN 7*  --  14 14 15 15   CREATININE 0.85  --  0.99 0.93 0.89 0.88  CALCIUM  9.2  --  8.9 8.8* 9.7 8.9   Liver Function Tests: Recent Labs  Lab 07/07/24 0340 07/08/24 0447  AST 17 13*  ALT 6 7  ALKPHOS 104 104  BILITOT 0.6 0.5  PROT 6.9 7.0  ALBUMIN 4.0 4.2   CBG: Recent Labs  Lab 07/07/24 1416 07/07/24 1823 07/07/24 2219 07/08/24 0815  GLUCAP 280* 220* 157* 142*    Discharge time spent: greater than 30 minutes.  Signed: Nena Rebel, MD [facility] 07/12/2024

## 2024-07-12 NOTE — TOC Initial Note (Signed)
 Transition of Care [facility]) - Initial/Assessment Note    Patient Details  Name: Victoria Medina MRN: [id number] Date of Birth: [birth year]  [facility] CM/SW Contact:    Victory Jackquline RAMAN, RN Phone Number: 07/12/2024, 1:14 PM  Clinical Narrative:  Patient discharging to [facility], RM# 405. Nurse to call report to: [phone]. Transportation set up with [facility] and she is next in line to be picked up. RNCM notified patient's sister Marta @ [phone]. No further concerns, RNCM signing off.              Expected Discharge Plan: Skilled Nursing Facility Barriers to Discharge: Barriers Resolved   Patient Goals and CMS Choice            Expected Discharge Plan and Services   Discharge Planning Services: CM Consult   Living arrangements for the past 2 months: Skilled Nursing Facility Expected Discharge Date: 07/12/24               DME Arranged: N/A         HH Arranged: NA          Prior Living Arrangements/Services Living arrangements for the past 2 months: Skilled Nursing Facility Lives with:: Facility Resident Patient language and need for interpreter reviewed:: Yes Do you feel safe going back to the place where you live?: Yes      Need for Family Participation in Patient Care: Yes (Comment) Care giver support system in place?: Yes (comment)   Criminal Activity/Legal Involvement Pertinent to Current Situation/Hospitalization: No - Comment as needed  Activities of Daily Living   ADL Screening (condition at time of admission) Independently performs ADLs?: No Does the patient have a NEW difficulty with bathing/dressing/toileting/self-feeding that is expected to last >3 days?: No Does the patient have a NEW difficulty with getting in/out of bed, walking, or climbing stairs that is expected to last >3 days?: No Does the patient have a NEW difficulty with communication that is expected to last >3 days?: No Is the patient deaf or have  difficulty hearing?: No Does the patient have difficulty seeing, even when wearing glasses/contacts?: No Does the patient have difficulty concentrating, remembering, or making decisions?: No  Permission Sought/Granted Permission sought to share information with : Facility Medical Sales Representative    Share Information with NAME: Quadasia Newsham  Permission granted to share info w AGENCY: [facility]  Permission granted to share info w Relationship: sister  Permission granted to share info w Contact Information: [phone]  Emotional Assessment Appearance:: Appears stated age       Alcohol / Substance Use: Not Applicable Psych Involvement: No (comment)  Admission diagnosis:  Hyponatremia [E87.1] SOB (shortness of breath) [R06.02] Acute on chronic congestive heart failure, unspecified heart failure type [facility]) [I50.9] Patient Active Problem List   Diagnosis Date Noted   Acute combined systolic and diastolic congestive heart failure (HCC) 07/09/2024   Hyponatremia 07/07/2024   Delusional disorder (HCC) 06/17/2024   Encounter for medication titration 06/17/2024   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) 09/10/2023   DNR (do not resuscitate) 09/13/2022   Chronic systolic CHF (congestive heart failure) (HCC) - LVEF 20-25% 06/09/2022   Major depressive disorder, recurrent, moderate (HCC) 06/09/2022   Suprapubic catheter (HCC) 10/09/2021   Coronary artery disease involving native coronary artery of native heart with unstable angina pectoris (HCC) 10/09/2021   Microcytic anemia 10/09/2021   Leukocytosis 04/22/2021   T2DM (type 2 diabetes mellitus) (HCC)  GERD (gastroesophageal reflux disease) 12/18/2014   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   Left knee pain 08/22/2013   Essential hypertension 07/11/2013   Acquired hypothyroidism 06/10/2013   Impingement syndrome of right shoulder 07/07/2012   Right shoulder pain 07/07/2012   Obesity, Class II,  BMI 35-39.9 06/04/2012   GAD (generalized anxiety disorder) 02/07/2011   Insomnia 02/07/2011   Intractable chronic migraine without aura 02/07/2011   Mixed hyperlipidemia 02/07/2011   Obstructive sleep apnea (adult) (pediatric) 02/07/2011   PCP:  [name], DO Pharmacy:   [facility] - [place], [place] - [address] [place] [address] Phone: [phone] Fax: [phone]     Social Drivers of Health (SDOH) Social History: SDOH Screenings   Food Insecurity: No Food Insecurity (07/09/2024)  Housing: Low Risk (07/09/2024)  Transportation Needs: No Transportation Needs (07/09/2024)  Utilities: Not At Risk (07/09/2024)  Depression (PHQ2-9): Low Risk (06/10/2024)  Social Connections: Socially Isolated (07/09/2024)  Tobacco Use: Low Risk (07/09/2024)   SDOH Interventions:     Readmission Risk Interventions     No data to display

## 2024-07-12 NOTE — Plan of Care (Signed)

## 2024-07-14 ENCOUNTER — Encounter: Payer: Self-pay | Admitting: Orthopedic Surgery

## 2024-07-14 ENCOUNTER — Non-Acute Institutional Stay (SKILLED_NURSING_FACILITY): Payer: Self-pay | Admitting: Orthopedic Surgery

## 2024-07-14 DIAGNOSIS — E039 Hypothyroidism, unspecified: Secondary | ICD-10-CM | POA: Diagnosis not present

## 2024-07-14 DIAGNOSIS — K219 Gastro-esophageal reflux disease without esophagitis: Secondary | ICD-10-CM | POA: Diagnosis not present

## 2024-07-14 DIAGNOSIS — I69354 Hemiplegia and hemiparesis following cerebral infarction affecting left non-dominant side: Secondary | ICD-10-CM

## 2024-07-14 DIAGNOSIS — I10 Essential (primary) hypertension: Secondary | ICD-10-CM

## 2024-07-14 DIAGNOSIS — F419 Anxiety disorder, unspecified: Secondary | ICD-10-CM | POA: Diagnosis not present

## 2024-07-14 DIAGNOSIS — I5041 Acute combined systolic (congestive) and diastolic (congestive) heart failure: Secondary | ICD-10-CM

## 2024-07-14 DIAGNOSIS — Z9359 Other cystostomy status: Secondary | ICD-10-CM

## 2024-07-14 DIAGNOSIS — G4733 Obstructive sleep apnea (adult) (pediatric): Secondary | ICD-10-CM

## 2024-07-14 DIAGNOSIS — E871 Hypo-osmolality and hyponatremia: Secondary | ICD-10-CM | POA: Diagnosis not present

## 2024-07-14 DIAGNOSIS — E782 Mixed hyperlipidemia: Secondary | ICD-10-CM

## 2024-07-14 NOTE — Progress Notes (Unsigned)
 Location:  Other [place].  Nursing Home Room Number: [facility] [id number] Place of Service:  SNF [phone]) Provider:  Greig Cluster, NP  PCP: [name], DO  Patient Care Team: [name], DO as PCP - General (Internal Medicine) [name], MD as PCP - Cardiology (Cardiology)  Extended Emergency Contact Information Primary Emergency Contact: Przybylski (POA),[name]  [place] Home Phone: [phone] Mobile Phone: [phone] Relation: Sister Secondary Emergency Contact: [name] Address: [id number]          [place], [place] [address] [place] Home Phone: [phone] Relation: Mother  Code Status:  DNR Goals of care: Advanced Directive information    07/07/2024    3:35 AM  Advanced Directives  Does Patient Have a Medical Advance Directive? Yes  Type of Advance Directive Out of facility DNR (pink MOST or yellow form)  Does patient want to make changes to medical advance directive? No - Patient declined  Pre-existing out of facility DNR order (yellow form or pink MOST form) Pink Most/Yellow Form available - Physician notified to receive inpatient order     Chief Complaint  Patient presents with   Hospitalization Follow-up    Hospital Follow up    HPI:  Pt is a 63 y.o. female seen today for f/u s/p hospitalization 12/08-12/13 due to shortness of breath.   She currently resides on the skilled nursing unit at [facility]. PMH: NSTEMI, CAD, DVT, PE, HTN, HFrEF, migraines, OSA, GERD, hypothyroidism, stroke with hemiplegia, myalgias, cervical intraepithelial glandular neoplasia, obesity, suprapubic catheter, anxiety and psychosis.   12/08 she was sent to the ED due to sob, chest pain and increased confusion. Na+ was 114, repeat 119. BNP was 4983. She was admitted to stepdown due to acute CHF and hyponatremia. Condition improved with IV furosemide  and po spirolactone. She did require BIPAP for a brief period of time. Sodium level  improved with diuretics. Echo revealed EF 20-25%, grade III DD. Cardiology recommended po furosemide  and f/u in 1 month. Na+ improved to 133 at discharge. Fluid restriction 1.2L daily recommended.   Today she denies chest pain or shortness of breath. Sats > 93% on room air. She is happy to be back at [facility]. Appetite fair per nursing. Afebrile. Vitals stable.    Past Medical History:  Diagnosis Date   Acute ST elevation myocardial infarction (STEMI) of inferolateral wall (HCC) 01/25/2020   Blind    Cervical intraepithelial glandular neoplasia 02/07/2011   Cholecystitis 04/22/2021   DVT (deep venous thrombosis) (HCC) 04/03/2012   History of migraine headaches    History of pulmonary embolism 04/02/2012   September 2013 after knee injury- hyperextension- . U/S of lower extremity revealed DVT in L leg. CTA revealed bilateral lower lobe segmental PEs and R upper lobe segmental PE. Patient was started on lovenox  and transferred upstairs. Cardiac enzymes were negative x1. NOAC for 7 months.         History of stroke 10/09/2021   Hyperlipemia    Myocardial infarction [facility])    NSTEMI (non-ST elevated myocardial infarction) (HCC) 10/27/2013   Formatting of this note might be different from the original.  01/08/2014: LHC: 2nd Marginal: 99% (pre) to 0% (post); STENT, PROMUS PREMIER MR 2.25X12MM     12/04/2013: LHC: Distal LAD: 70% (pre) to 0% (post) CATH, MINI TREK RX 2.0X20MM STENT, PROMUS PREMIER MR 2.25X28MM; Mid LAD: 70% (pre) to 0% (post), STENT, PROMUS PREMIER MR 2.50X12MM     10/27/2013. LHC: 3 vessel Disease,  Stent x2:  Proximal LAD:   Stroke [facility])    Past Surgical History:  Procedure Laterality Date   ABDOMINAL HYSTERECTOMY     BACK SURGERY     CARDIAC CATHETERIZATION     CORONARY/GRAFT ACUTE MI REVASCULARIZATION N/A 01/25/2020   Procedure: Coronary/Graft Acute MI Revascularization;  Surgeon: [name], MD;  Location: [facility] INVASIVE CV LAB;  Service: Cardiovascular;  Laterality: N/A;    FRACTURE SURGERY     IR CATHETER TUBE CHANGE  08/10/2021   LEFT HEART CATH AND CORONARY ANGIOGRAPHY N/A 01/25/2020   Procedure: LEFT HEART CATH AND CORONARY ANGIOGRAPHY;  Surgeon: [name], MD;  Location: [facility] INVASIVE CV LAB;  Service: Cardiovascular;  Laterality: N/A;   SKIN GRAFT Right    TOTAL ABDOMINAL HYSTERECTOMY Bilateral     Allergies[1]  Outpatient Encounter Medications as of 07/14/2024  Medication Sig   aspirin  EC 81 MG tablet Take 81 mg by mouth daily.   Cholecalciferol (VITAMIN D3) 1.25 MG (50000 UT) CAPS Take 1 capsule by mouth once a week.  every Mon for Supplement   Dextromethorphan-Benzocaine (COUGH/SORE THROAT LOZENGES MT) Use as directed 1 drop in the mouth or throat every 4 (four) hours as needed.   diazepam  (VALIUM ) 10 MG tablet Take 1 tablet (10 mg total) by mouth 4 (four) times daily.   fluticasone  (FLONASE ) 50 MCG/ACT nasal spray Place 1 spray into both nostrils 2 (two) times daily.   furosemide  (LASIX ) 40 MG tablet Take 1 tablet (40 mg total) by mouth daily.   ibuprofen  (ADVIL ) 800 MG tablet Take 800 mg by mouth 2 (two) times daily as needed for mild pain (pain score 1-3) or moderate pain (pain score 4-6).   isosorbide  mononitrate (IMDUR ) 60 MG 24 hr tablet Take 60 mg by mouth daily.   lansoprazole (PREVACID) 15 MG capsule Take 15 mg by mouth 2 (two) times daily before a meal.   levothyroxine  (SYNTHROID ) 25 MCG tablet Take 25 mcg by mouth daily before breakfast.   losartan  (COZAAR ) 100 MG tablet Take 100 mg by mouth daily.   magnesium  hydroxide (MILK OF MAGNESIA) 400 MG/5ML suspension Take 5 mLs by mouth every 6 (six) hours as needed for mild constipation.   Menthol (COUGH DROPS) 2.7 MG LOZG 1 tablet by Transmucosal route every 4 (four) hours as needed.   metoprolol  succinate (TOPROL -XL) 25 MG 24 hr tablet Take 25 mg by mouth daily.   nitroGLYCERIN  (NITROSTAT ) 0.4 MG SL tablet Place 0.4 mg under the tongue every 5 (five) minutes x 3 doses as needed for  chest pain.    OLANZapine  (ZYPREXA ) 5 MG tablet Take 5 mg by mouth daily.   ondansetron  (ZOFRAN -ODT) 4 MG disintegrating tablet Take 4 mg by mouth every 4 (four) hours as needed for nausea or vomiting.   polyethylene glycol (MIRALAX  / GLYCOLAX ) 17 g packet Take 17 g by mouth daily.   promethazine  (PHENERGAN ) 12.5 MG suppository Place 12.5 mg rectally every 6 (six) hours as needed.   ranolazine  (RANEXA ) 500 MG 12 hr tablet Take 500 mg by mouth 2 (two) times daily.   rosuvastatin  (CRESTOR ) 5 MG tablet Take 5 mg by mouth daily. Every Tuesday,Thursday,Sat   senna-docusate (SENOKOT-S) 8.6-50 MG tablet Take 2 tablets by mouth at bedtime.   spironolactone  (ALDACTONE ) 25 MG tablet Take 0.5 tablets (12.5 mg total) by mouth daily.   Vibegron  (GEMTESA ) 75 MG TABS Take 75 mg by mouth daily.   No facility-administered encounter medications on file as of 07/14/2024.  Review of Systems  Constitutional:  Negative for fatigue and fever.  HENT:  Negative for sore throat and trouble swallowing.   Eyes:  Positive for visual disturbance.  Respiratory:  Negative for cough and shortness of breath.   Cardiovascular:  Positive for leg swelling. Negative for chest pain.  Gastrointestinal:  Negative for abdominal distention and abdominal pain.  Genitourinary:  Negative for dysuria and frequency.  Musculoskeletal:  Positive for gait problem.  Skin:  Negative for wound.  Neurological:  Positive for weakness. Negative for dizziness and headaches.  Psychiatric/Behavioral:  Negative for dysphoric mood and sleep disturbance. The patient is not nervous/anxious.     Immunization History  Administered Date(s) Administered   Influenza-Unspecified 04/28/2011, 06/14/2012   Moderna Sars-Covid-2 Vaccination 09/08/2019, 10/06/2019, 06/11/2020   Tdap 09/03/2013, 11/15/2016   Zoster Recombinant(Shingrix) 12/08/2022, 03/10/2023   Pertinent  Health Maintenance Due  Topic Date Due   Colonoscopy  10/10/2024 (Originally  07/09/2006)   OPHTHALMOLOGY EXAM  10/22/2024 (Originally 07/10/1971)   Mammogram  11/29/2024   HEMOGLOBIN A1C  12/10/2024   FOOT EXAM  05/21/2025   Influenza Vaccine  Discontinued      05/28/2022    2:13 AM 12/07/2022    2:19 PM 12/27/2023   11:28 AM 05/25/2024    9:46 AM 06/10/2024   12:47 PM  Fall Risk  Falls in the past year?  Exclusion - non ambulatory 0 0 0  Was there an injury with Fall?    0  0   Fall Risk Category Calculator    0 0  (RETIRED) Patient Fall Risk Level Moderate fall risk       Patient at Risk for Falls Due to    History of fall(s) History of fall(s);Impaired balance/gait;Impaired mobility;Impaired vision  Fall risk Follow up    Falls evaluation completed Falls evaluation completed     Data saved with a previous flowsheet row definition   Functional Status Survey:    Vitals:   07/14/24 1026  BP: 128/70  Pulse: 67  Resp: (!) 22  Temp: (!) 97.5 F (36.4 C)  SpO2: 92%  Weight: 178 lb 12.8 oz (81.1 kg)  Height: 5' 1.5 (1.562 m)   Body mass index is 33.24 kg/m. Physical Exam Vitals reviewed.  Constitutional:      General: She is not in acute distress. HENT:     Head: Normocephalic.  Eyes:     General:        Right eye: No discharge.        Left eye: No discharge.  Cardiovascular:     Rate and Rhythm: Normal rate and regular rhythm.     Pulses: Normal pulses.     Heart sounds: Normal heart sounds.  Pulmonary:     Effort: Pulmonary effort is normal. No respiratory distress.     Breath sounds: Normal breath sounds. No wheezing or rales.  Abdominal:     General: Bowel sounds are normal. There is no distension.     Palpations: Abdomen is soft.     Tenderness: There is no abdominal tenderness.  Genitourinary:    Comments: Suprapubic cath with adequate UOP, urine yellow with scant sediment Musculoskeletal:     Cervical back: Neck supple.     Right lower leg: Edema present.     Left lower leg: Edema present.     Comments: Non pitting  Skin:     General: Skin is warm.     Capillary Refill: Capillary refill takes less than 2 seconds.  Neurological:  General: No focal deficit present.     Mental Status: She is alert. Mental status is at baseline.     Motor: Weakness present.     Gait: Gait abnormal.  Psychiatric:        Mood and Affect: Mood normal.     Comments: Pleasant, alert to self/familiar face/place, follows commands     Labs reviewed: Recent Labs    06/05/24 1433 06/06/24 1240 07/10/24 0413 07/11/24 0358 07/12/24 0404  NA  --    < > 132* 130* 132*  K  --    < > 4.0 3.7 3.5  CL  --    < > 86* 85* 85*  CO2  --    < > 37* 37* 35*  GLUCOSE  --    < > 134* 148* 145*  BUN  --    < > 14 15 15   CREATININE  --    < > 0.93 0.89 0.88  CALCIUM   --    < > 8.8* 9.7 8.9  MG 1.8  --   --   --   --    < > = values in this interval not displayed.   Recent Labs    06/12/24 0300 07/07/24 0340 07/08/24 0447  AST 13* 17 13*  ALT 5 6 7   ALKPHOS 75 104 104  BILITOT 0.3 0.6 0.5  PROT 5.6* 6.9 7.0  ALBUMIN 3.4* 4.0 4.2   Recent Labs    06/09/24 0000 06/12/24 0150 06/13/24 0522 07/07/24 0340 07/07/24 1020 07/08/24 0447 07/10/24 0413 07/11/24 0358  WBC 12.9 15.6*   < > 12.2*   < > 14.4* 11.2* 11.8*  NEUTROABS 9,817.00 13.1*  --  10.2*  --   --   --   --   HGB 12.0 11.6*   < > 12.7   < > 12.6 12.2 12.0  HCT 37 34.7*   < > 38.5   < > 37.4 37.6 36.5  MCV  --  83.2   < > 82.4   < > 82.0 84.1 83.7  PLT 307 323   < > 268   < > 392 411* 375   < > = values in this interval not displayed.   Lab Results  Component Value Date   TSH 0.976 07/08/2024   Lab Results  Component Value Date   HGBA1C 6.1 (H) 06/12/2024   Lab Results  Component Value Date   CHOL 196 06/05/2024   HDL 69 06/05/2024   LDLCALC 113 (H) 06/05/2024   TRIG 68 06/05/2024   CHOLHDL 2.8 06/05/2024    Significant Diagnostic Results in last 30 days:  DG Chest Port 1 View Result Date: 07/07/2024 EXAM: 1 VIEW(S) XRAY OF THE CHEST 07/07/2024  03:59:00 AM COMPARISON: 06/12/2024 CLINICAL HISTORY: shob cp FINDINGS: LUNGS AND PLEURA: Stable diffuse mild interstitial pulmonary edema. No pleural effusion. No pneumothorax. HEART AND MEDIASTINUM: Mild cardiomegaly. Aortic atherosclerosis. BONES AND SOFT TISSUES: No acute osseous abnormality. IMPRESSION: 1. Stable diffuse mild interstitial pulmonary edema. 2. Mild cardiomegaly. Electronically signed by: Dorethia Molt MD 07/07/2024 04:08 AM EST RP Workstation: [id number]    Assessment/Plan 1. Hyponatremia (Primary) - Na 114 on admission> rechecked 119 - improved with diuresis  - discharge Na 133 - nephrology signed off - cont fluid restriction   2. Acute combined systolic and diastolic congestive heart failure (HCC) - BNP 4983 - LVEF 20-25%, grade III DD - improved with IV furosemide  and spirolactone  - lung sounds improved, non pitting edema  -  cont furosemide  and spirolactone  - f/u cardiology in 4 weeks  3. 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) - cont skilled nursing care  4. Essential hypertension - controlled with losartan , furosemide , metoprolol  and spirolactone   5. Obstructive sleep apnea (adult) (pediatric) - cont CPAP at bedtime   6. Acquired hypothyroidism - TSH 0.976 12/0 - cont levothyroxine   7. Gastroesophageal reflux disease without esophagitis - hgb stable  - cont lansoprazole  8. Suprapubic catheter (HCC) - cont monthly exchanges  9. Mixed hyperlipidemia - cont rosuvastatin   10. Anxiety - stable mood - cont diazepam     Family/ staff Communication: plan discussed with patient and nurse  Labs/tests ordered: repeat bmp         [1]  Allergies Allergen Reactions   Levaquin [Levofloxacin In D5w] Anaphylaxis and Swelling   Iodinated Contrast Media Itching    Severe itching   Other     Dust mites and opiates (all of them)   Latex Dermatitis

## 2024-07-16 ENCOUNTER — Non-Acute Institutional Stay (SKILLED_NURSING_FACILITY): Payer: Self-pay | Admitting: Internal Medicine

## 2024-07-16 ENCOUNTER — Encounter: Payer: Self-pay | Admitting: Internal Medicine

## 2024-07-16 DIAGNOSIS — Z5181 Encounter for therapeutic drug level monitoring: Secondary | ICD-10-CM | POA: Diagnosis not present

## 2024-07-16 DIAGNOSIS — I69354 Hemiplegia and hemiparesis following cerebral infarction affecting left non-dominant side: Secondary | ICD-10-CM | POA: Diagnosis not present

## 2024-07-16 DIAGNOSIS — Z66 Do not resuscitate: Secondary | ICD-10-CM

## 2024-07-16 DIAGNOSIS — F411 Generalized anxiety disorder: Secondary | ICD-10-CM | POA: Diagnosis not present

## 2024-07-16 DIAGNOSIS — H47619 Cortical blindness, unspecified side of brain: Secondary | ICD-10-CM | POA: Diagnosis not present

## 2024-07-16 DIAGNOSIS — F22 Delusional disorders: Secondary | ICD-10-CM | POA: Diagnosis not present

## 2024-07-16 DIAGNOSIS — E039 Hypothyroidism, unspecified: Secondary | ICD-10-CM | POA: Diagnosis not present

## 2024-07-16 DIAGNOSIS — E66811 Obesity, class 1: Secondary | ICD-10-CM

## 2024-07-16 DIAGNOSIS — Z9359 Other cystostomy status: Secondary | ICD-10-CM | POA: Diagnosis not present

## 2024-07-16 DIAGNOSIS — I5022 Chronic systolic (congestive) heart failure: Secondary | ICD-10-CM | POA: Diagnosis not present

## 2024-07-16 DIAGNOSIS — E782 Mixed hyperlipidemia: Secondary | ICD-10-CM

## 2024-07-16 DIAGNOSIS — K219 Gastro-esophageal reflux disease without esophagitis: Secondary | ICD-10-CM | POA: Diagnosis not present

## 2024-07-16 DIAGNOSIS — I10 Essential (primary) hypertension: Secondary | ICD-10-CM

## 2024-07-16 NOTE — Assessment & Plan Note (Signed)
 07/16/2024 continue with Prevacid 15 mg twice daily

## 2024-07-16 NOTE — Assessment & Plan Note (Signed)
 07/16/2024 chronic suprapubic catheter. Her having a chronic suprapubic catheter and a history of complicated UTIs is a contraindication to starting SGLT-2 inhibitors for her CHF.

## 2024-07-16 NOTE — Assessment & Plan Note (Signed)
 07/16/2024 chronic

## 2024-07-16 NOTE — Assessment & Plan Note (Signed)
 07/16/2024 patient was just released from the hospital on 07/12/2024. She remains on Zyprexa  5 mg daily. She is also on chronically on Valium  10 mg 4 times a day. Given her recent 3 hospitalizations in the last 2 months, we will readdress any further dose reductions or antipsychotics in January 2026.     started on Zyprexa  by Dr. Abdul on 2-12-204 for hallucinations at 2.5 mg twice daily. On January 31, 2023, her dose was increased to 5 mg twice daily due to the patient's increased hallucinations causing a fall out of bed. Dr. Abdul documents on her January 31, 2023 note that the patient had failed dose reductions in her Zyprexa . It appears around April 2025, her Zyprexa  dose was decreased to 5 mg once a day due to hyponatremia.

## 2024-07-16 NOTE — Assessment & Plan Note (Signed)
 07/16/2024 she is currently on guideline directed medical therapy for her CHF which will also treat her hypertension.  Meds include Aldactone  12.5 mg daily, Imdur  60 mg daily, Lasix  40 mg daily, losartan  100 mg daily, Toprol -XL 25 mg daily

## 2024-07-16 NOTE — Assessment & Plan Note (Signed)
Body mass index is 33.24 kg/m.

## 2024-07-16 NOTE — Assessment & Plan Note (Signed)
 07-16-2024. December 2025 hospital admission for acute on chronic systolic heart failure and severe hyponatremia felt to be due to polydipsia from the patient eating ice and drinking water.  For CHF/guideline directed medical therapy for chronic systolic heart failure remains Aldactone  12.5 mg daily, Lasix  40 mg daily, Imdur  60 mg daily, losartan  100 mg daily, Toprol -XL 25 mg daily.  She is not a candidate for Doreen due to history of UTIs and a chronic suprapubic tube.  She now is on a 1200 cc/day fluid restriction.  Most recent chemistry from July 12, 2024 showed a serum sodium of 132, potassium 3.5, BUN of 15, creatinine 0.88, serum bicarbonate 35

## 2024-07-16 NOTE — Assessment & Plan Note (Signed)
 07/16/2024 Continue with Valium  10 mg 4 times a day. She has been on this long-term. Patient has a long history of anxiety. Attempts at weaning have been unsuccessful in the past. Attempts at weaning benzodiazepines causes the patient extreme anxiety and should not be reattempted

## 2024-07-16 NOTE — Progress Notes (Signed)
 [facility] SNF Admission H&P  Provider: [name] Door, DO Location:  Other [place].  Nursing Home Room Number: [facility] [id number] Place of Service:  SNF (31)   PCP: Door [name], DO Patient Care Team: Door [name], DO as PCP - General (Internal Medicine) [name], MD as PCP - Cardiology (Cardiology)   Extended Emergency Contact Information Primary Emergency Contact: Rath [facility]  [place] Home Phone: [phone] Mobile Phone: [phone] Relation: Sister Secondary Emergency Contact: [name] Address: [address]          [place], [place] [address] [place] Home Phone: [phone] Relation: Mother   Goals of Care: Advanced Directive information    07/07/2024    3:35 AM  Advanced Directives  Does Patient Have a Medical Advance Directive? Yes  Type of Advance Directive Out of facility DNR (pink MOST or yellow form)  Does patient want to make changes to medical advance directive? No - Patient declined  Pre-existing out of facility DNR order (yellow form or pink MOST form) Pink Most/Yellow Form available - Physician notified to receive inpatient order    CODE STATUS: Do Not Resuscitate (DNR)    Chief Complaint  Patient presents with   Admission    Admission.      HPI: Patient is a 63 y.o. female seen today for admission to [facility].  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  Hospital course is as follows.:  12/8: Presents to the ED from [facility]  for chief concerns of difficulty breathing and chest pain.   12/8: Patient admitted to [facility] hospitalist service for chief concerns of acute on chronic severe hyponatremia.  Per H&P, 3% saline was given in the ED.  Initial sodium is 114 and on repeat at that time was 119.  Patient was admitted to stepdown.  Patient was also treated for combined acute exacerbation of heart failure reduced and preserved ejection fraction. Patient was given furosemide  40 mg IV twice daily and spironolactone  p.o. was continued.  BiPAP therapy was initiated.  Oxygen supplementation to maintain SpO2 greater than 90%.  Cardiology was consulted to help manage combined heart failure reduced and preserved ejection fraction exacerbation. 12/9: Lasix  IV was continued.  Serum sodium improved to 128.  Patient was weaned off nasal cannula.   12/10: Nephrology, cardiology is following for hyponatremia.  Cardiology recommends continue furosemide  IV at this time.  Nephrology signed off as serum sodium is 130.  Continue strict I's and Os   12/11: Patient is doing much better. 12/12: Patient appears to be at baseline and cardiologist changed diuretics to p.o. 12/13: Patient is optimized on room air, Lasix  40 mg daily, spironolactone , discharge back to skilled nursing facility  She was discharged back to [facility] on a fluid restriction of 1200 cc/day.  Lasix  40 mg daily and Aldactone  12.5 mg daily.  She is seen today for readmission back to long-term care at [facility] where she has been a resident here since August 20, 2017.  Since discharge from the facility, patient states that she is feeling okay.  No longer having any shortness of breath.  No longer having any peripheral edema.  She did not like breakfast today and did not eat.  No other complaints.  This is a comprehensive admission note to this SNF performed on this date less than 30 days from date of admission. Included are preadmission medical/surgical history; reconciled  medication list; family history; social history and comprehensive review of systems.  Corrections and additions to the records were documented. Comprehensive physical exam was also performed. Additionally a clinical summary was entered for each active diagnosis pertinent to this admission in the Problem List to enhance continuity of care.   Past Medical History:  Diagnosis Date   Acute ST elevation myocardial infarction (STEMI) of inferolateral wall (HCC) 01/25/2020   Blind    Cervical intraepithelial glandular neoplasia 02/07/2011   Cholecystitis 04/22/2021   DVT (deep venous thrombosis) (HCC) 04/03/2012   History of migraine headaches    History of pulmonary embolism 04/02/2012   September 2013 after knee injury- hyperextension- . U/S of lower extremity revealed DVT in L leg. CTA revealed bilateral lower lobe segmental PEs and R upper lobe segmental PE. Patient was started on lovenox  and transferred upstairs. Cardiac enzymes were negative x1. NOAC for 7 months.         History of stroke 10/09/2021   Hyperlipemia    Myocardial infarction [facility])    NSTEMI (non-ST elevated myocardial infarction) (HCC) 10/27/2013   Formatting of this note might be different from the original.  01/08/2014: LHC: 2nd Marginal: 99% (pre) to 0% (post); STENT, PROMUS PREMIER MR 2.25X12MM     12/04/2013: LHC: Distal LAD: 70% (pre) to 0% (post) CATH, MINI TREK RX 2.0X20MM STENT, PROMUS PREMIER MR 2.25X28MM; Mid LAD: 70% (pre) to 0% (post), STENT, PROMUS PREMIER MR 2.50X12MM     10/27/2013. LHC: 3 vessel Disease, Stent x2:  Proximal LAD:   Stroke [facility])    Past Surgical History:  Procedure Laterality Date   ABDOMINAL HYSTERECTOMY     BACK SURGERY     CARDIAC CATHETERIZATION     CORONARY/GRAFT ACUTE MI REVASCULARIZATION N/A 01/25/2020   Procedure: Coronary/Graft Acute MI Revascularization;  Surgeon: [name], MD;  Location: [facility] INVASIVE CV LAB;  Service: Cardiovascular;  Laterality: N/A;   FRACTURE SURGERY     IR  CATHETER TUBE CHANGE  08/10/2021   LEFT HEART CATH AND CORONARY ANGIOGRAPHY N/A 01/25/2020   Procedure: LEFT HEART CATH AND CORONARY ANGIOGRAPHY;  Surgeon: [name], MD;  Location: [facility] INVASIVE CV LAB;  Service: Cardiovascular;  Laterality: N/A;   SKIN GRAFT Right    TOTAL ABDOMINAL HYSTERECTOMY Bilateral     reports that she has never smoked. She has never used smokeless tobacco. She reports current alcohol use. She reports that she does not use drugs. Social History   Socioeconomic History   Marital status: Single    Spouse name: Not on file   Number of children: Not on file   Years of education: Not on file   Highest education level: Not on file  Occupational History   Not on file  Tobacco Use   Smoking status: Never   Smokeless tobacco: Never  Vaping Use   Vaping status: Never Used  Substance and Sexual Activity   Alcohol use: Yes    Comment: occasional   Drug use: No   Sexual activity: Not Currently  Other Topics Concern   Not on file  Social History Narrative   Not on file   Social Drivers of  Health   Tobacco Use: Low Risk (07/16/2024)   Patient History    Smoking Tobacco Use: Never    Smokeless Tobacco Use: Never    Passive Exposure: Not on file  Financial Resource Strain: Not on file  Food Insecurity: No Food Insecurity (07/09/2024)   Epic    Worried About Programme Researcher, Broadcasting/film/video in the Last Year: Never true    Ran Out of Food in the Last Year: Never true  Transportation Needs: No Transportation Needs (07/09/2024)   Epic    Lack of Transportation (Medical): No    Lack of Transportation (Non-Medical): No  Physical Activity: Not on file  Stress: Not on file  Social Connections: Socially Isolated (07/09/2024)   Social Connection and Isolation Panel    Frequency of Communication with Friends and Family: Twice a week    Frequency of Social Gatherings with Friends and Family: Never    Attends Religious Services: Never    Database Administrator or  Organizations: No    Attends Banker Meetings: Never    Marital Status: Never married  Intimate Partner Violence: Not At Risk (07/09/2024)   Epic    Fear of Current or Ex-Partner: No    Emotionally Abused: No    Physically Abused: No    Sexually Abused: No  Depression (PHQ2-9): Low Risk (06/10/2024)   Depression (PHQ2-9)    PHQ-2 Score: 0  Alcohol Screen: Not on file  Housing: Low Risk (07/09/2024)   Epic    Unable to Pay for Housing in the Last Year: No    Number of Times Moved in the Last Year: 0    Homeless in the Last Year: No  Utilities: Not At Risk (07/09/2024)   Epic    Threatened with loss of utilities: No  Health Literacy: Not on file     Functional Status Survey:     Family History  Problem Relation Age of Onset   Heart attack Mother    Hypertension Mother    Hypercholesterolemia Mother    Diabetes Mother    Stroke Father    Heart attack Father    Hypertension Father    Hypercholesterolemia Father    Diabetes Father    Diabetes Maternal Grandmother    Cancer - Other Maternal Grandmother      Health Maintenance  Topic Date Due   Pneumococcal Vaccine: 50+ Years (1 of 2 - PCV) Never done   Diabetic kidney evaluation - Urine ACR  04/16/2024   Colonoscopy  10/10/2024 (Originally 07/09/2006)   OPHTHALMOLOGY EXAM  10/22/2024 (Originally 07/10/1971)   Mammogram  11/29/2024   HEMOGLOBIN A1C  12/10/2024   Medicare Annual Wellness (AWV)  12/26/2024   FOOT EXAM  05/21/2025   Diabetic kidney evaluation - eGFR measurement  07/12/2025   DTaP/Tdap/Td (3 - Td or Tdap) 11/16/2026   Hepatitis C Screening  Completed   HIV Screening  Completed   Zoster Vaccines- Shingrix  Completed   Hepatitis B Vaccines 19-59 Average Risk  Aged Out   HPV VACCINES  Aged Out   Meningococcal B Vaccine  Aged Out   Influenza Vaccine  Discontinued   COVID-19 Vaccine  Discontinued     Allergies[1]   Outpatient Encounter Medications as of 07/16/2024  Medication Sig    aspirin  EC 81 MG tablet Take 81 mg by mouth daily.   Cholecalciferol (VITAMIN D3) 1.25 MG (50000 UT) CAPS Take 1 capsule by mouth once a week.  every Mon for Supplement   Dextromethorphan-Benzocaine (  COUGH/SORE THROAT LOZENGES MT) Use as directed 1 drop in the mouth or throat every 4 (four) hours as needed.   diazepam  (VALIUM ) 10 MG tablet Take 1 tablet (10 mg total) by mouth 4 (four) times daily.   fluticasone  (FLONASE ) 50 MCG/ACT nasal spray Place 1 spray into both nostrils 2 (two) times daily.   furosemide  (LASIX ) 40 MG tablet Take 1 tablet (40 mg total) by mouth daily.   ibuprofen  (ADVIL ) 800 MG tablet Take 800 mg by mouth 2 (two) times daily as needed for mild pain (pain score 1-3) or moderate pain (pain score 4-6).   isosorbide  mononitrate (IMDUR ) 60 MG 24 hr tablet Take 60 mg by mouth daily.   lansoprazole (PREVACID) 15 MG capsule Take 15 mg by mouth 2 (two) times daily before a meal.   levothyroxine  (SYNTHROID ) 25 MCG tablet Take 25 mcg by mouth daily before breakfast.   losartan  (COZAAR ) 100 MG tablet Take 100 mg by mouth daily.   magnesium  hydroxide (MILK OF MAGNESIA) 400 MG/5ML suspension Take 5 mLs by mouth every 6 (six) hours as needed for mild constipation.   Menthol (COUGH DROPS) 2.7 MG LOZG 1 tablet by Transmucosal route every 4 (four) hours as needed.   metoprolol  succinate (TOPROL -XL) 25 MG 24 hr tablet Take 25 mg by mouth daily.   nitroGLYCERIN  (NITROSTAT ) 0.4 MG SL tablet Place 0.4 mg under the tongue every 5 (five) minutes x 3 doses as needed for chest pain.    OLANZapine  (ZYPREXA ) 5 MG tablet Take 5 mg by mouth daily.   ondansetron  (ZOFRAN -ODT) 4 MG disintegrating tablet Take 4 mg by mouth every 4 (four) hours as needed for nausea or vomiting.   polyethylene glycol (MIRALAX  / GLYCOLAX ) 17 g packet Take 17 g by mouth daily.   promethazine  (PHENERGAN ) 12.5 MG suppository Place 12.5 mg rectally every 6 (six) hours as needed.   ranolazine  (RANEXA ) 500 MG 12 hr tablet Take 500 mg  by mouth 2 (two) times daily.   rosuvastatin  (CRESTOR ) 5 MG tablet Take 5 mg by mouth daily. Every Tuesday,Thursday,Sat   senna-docusate (SENOKOT-S) 8.6-50 MG tablet Take 2 tablets by mouth at bedtime.   spironolactone  (ALDACTONE ) 25 MG tablet Take 0.5 tablets (12.5 mg total) by mouth daily.   Vibegron  (GEMTESA ) 75 MG TABS Take 75 mg by mouth daily.   No facility-administered encounter medications on file as of 07/16/2024.     Review of Systems  Constitutional: Negative.   HENT: Negative.    Eyes: Negative.   Respiratory: Negative.    Cardiovascular: Negative.   Gastrointestinal: Negative.   Endocrine: Negative.   Genitourinary: Negative.   Musculoskeletal: Negative.   Skin: Negative.   Allergic/Immunologic: Negative.   Neurological: Negative.   Hematological: Negative.   Psychiatric/Behavioral: Negative.    All other systems reviewed and are negative.    Vitals:   07/16/24 0814  BP: (!) 92/56  Pulse: 78  Resp: 16  Temp: (!) 96.3 F (35.7 C)  SpO2: 91%  Weight: 178 lb 12.8 oz (81.1 kg)  Height: 5' 1.5 (1.562 m)   Body mass index is 33.24 kg/m. Physical Exam Vitals and nursing note reviewed.  Constitutional:      General: She is not in acute distress.    Appearance: She is obese. She is not toxic-appearing.     Comments: Chronically ill appearing female. No distress  HENT:     Head: Normocephalic and atraumatic.  Eyes:     Comments: No light perception in eyes  Cardiovascular:  Rate and Rhythm: Normal rate and regular rhythm.  Pulmonary:     Effort: Pulmonary effort is normal.     Breath sounds: Normal breath sounds.  Abdominal:     General: Abdomen is protuberant. Bowel sounds are normal. There is no distension.     Palpations: Abdomen is soft.     Tenderness: There is no abdominal tenderness.  Genitourinary:    Comments: +suprapubic tube Musculoskeletal:     Comments: Chronic left wrist flexion contracture. Pt is able to voluntarily move and  flex/extend her left elbow, flex/extend/abduct/adduct and shrug left shoulder.  Skin:    General: Skin is warm and dry.     Capillary Refill: Capillary refill takes less than 2 seconds.  Neurological:     Mental Status: She is oriented to person, place, and time.      Labs reviewed: Basic Metabolic Panel: Recent Labs    06/05/24 1433 06/06/24 1240 07/10/24 0413 07/11/24 0358 07/12/24 0404  NA  --    < > 132* 130* 132*  K  --    < > 4.0 3.7 3.5  CL  --    < > 86* 85* 85*  CO2  --    < > 37* 37* 35*  GLUCOSE  --    < > 134* 148* 145*  BUN  --    < > 14 15 15   CREATININE  --    < > 0.93 0.89 0.88  CALCIUM   --    < > 8.8* 9.7 8.9  MG 1.8  --   --   --   --    < > = values in this interval not displayed.   Liver Function Tests: Recent Labs    06/12/24 0300 07/07/24 0340 07/08/24 0447  AST 13* 17 13*  ALT 5 6 7   ALKPHOS 75 104 104  BILITOT 0.3 0.6 0.5  PROT 5.6* 6.9 7.0  ALBUMIN 3.4* 4.0 4.2   Recent Labs    11/11/23 1336 07/07/24 0340  LIPASE 36 24    CBC: Recent Labs    06/09/24 0000 06/12/24 0150 06/13/24 0522 07/07/24 0340 07/07/24 1020 07/08/24 0447 07/10/24 0413 07/11/24 0358  WBC 12.9 15.6*   < > 12.2*   < > 14.4* 11.2* 11.8*  NEUTROABS 9,817.00 13.1*  --  10.2*  --   --   --   --   HGB 12.0 11.6*   < > 12.7   < > 12.6 12.2 12.0  HCT 37 34.7*   < > 38.5   < > 37.4 37.6 36.5  MCV  --  83.2   < > 82.4   < > 82.0 84.1 83.7  PLT 307 323   < > 268   < > 392 411* 375   < > = values in this interval not displayed.    Lab Results  Component Value Date   HGBA1C 6.1 (H) 06/12/2024   Lab Results  Component Value Date   TSH 0.976 07/08/2024    Lab Results  Component Value Date   IRON 25 11/08/2023   TIBC 398 11/08/2023   FERRITIN 12 11/08/2023     Imaging and Procedures obtained prior to SNF admission: DG Chest Port 1 View Result Date: 07/07/2024 EXAM: 1 VIEW(S) XRAY OF THE CHEST 07/07/2024 03:59:00 AM COMPARISON: 06/12/2024 CLINICAL  HISTORY: shob cp FINDINGS: LUNGS AND PLEURA: Stable diffuse mild interstitial pulmonary edema. No pleural effusion. No pneumothorax. HEART AND MEDIASTINUM: Mild cardiomegaly. Aortic atherosclerosis. BONES AND SOFT  TISSUES: No acute osseous abnormality. IMPRESSION: 1. Stable diffuse mild interstitial pulmonary edema. 2. Mild cardiomegaly. Electronically signed by: Dorethia Molt MD 07/07/2024 04:08 AM EST RP Workstation: [id number]     Assessment/Plan Chronic systolic CHF (congestive heart failure) (HCC) - LVEF 20-25% 07-16-2024. December 2025 hospital admission for acute on chronic systolic heart failure and severe hyponatremia felt to be due to polydipsia from the patient eating ice and drinking water.  For CHF/guideline directed medical therapy for chronic systolic heart failure remains Aldactone  12.5 mg daily, Lasix  40 mg daily, Imdur  60 mg daily, losartan  100 mg daily, Toprol -XL 25 mg daily.  She is not a candidate for Doreen due to history of UTIs and a chronic suprapubic tube.  She now is on a 1200 cc/day fluid restriction.  Most recent chemistry from July 12, 2024 showed a serum sodium of 132, potassium 3.5, BUN of 15, creatinine 0.88, serum bicarbonate 35   Encounter for medication titration 07/16/2024 patient was just released from the hospital on 07/12/2024.  She remains on Zyprexa  5 mg daily.  She is also on chronically on Valium  10 mg 4 times a day.  Given her recent 3 hospitalizations in the last 2 months, we will readdress any further dose reductions or antipsychotics in January 2026.    GAD (generalized anxiety disorder) 07/16/2024 Continue with Valium  10 mg 4 times a day. She has been on this long-term. Patient has a long history of anxiety. Attempts at weaning have been unsuccessful in the past. Attempts at weaning benzodiazepines causes the patient extreme anxiety and should not be reattempted   Delusional disorder (HCC) 07/16/2024 patient was just released from the hospital on  07/12/2024. She remains on Zyprexa  5 mg daily. She is also on chronically on Valium  10 mg 4 times a day. Given her recent 3 hospitalizations in the last 2 months, we will readdress any further dose reductions or antipsychotics in January 2026.     started on Zyprexa  by Dr. Abdul on 2-12-204 for hallucinations at 2.5 mg twice daily. On January 31, 2023, her dose was increased to 5 mg twice daily due to the patient's increased hallucinations causing a fall out of bed. Dr. Abdul documents on her January 31, 2023 note that the patient had failed dose reductions in her Zyprexa . It appears around April 2025, her Zyprexa  dose was decreased to 5 mg once a day due to hyponatremia.   Essential hypertension 07/16/2024 she is currently on guideline directed medical therapy for her CHF which will also treat her hypertension.  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   Suprapubic catheter (HCC) 07/16/2024 chronic suprapubic catheter. Her having a chronic suprapubic catheter and a history of complicated UTIs is a contraindication to starting SGLT-2 inhibitors for her CHF.   DNR (do not resuscitate) DNR/DNI. Confirmed. DNR form on file.  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) 07/16/2024 chronic left wrist flexion contracture. However she is able to flex/extend at left elbow. She is able toe flex/extend/adduct/abduct at left shoulder.   Mixed hyperlipidemia 07/16/2024 continue with crestor  5 mg daily.    Family/ staff Communication: no family at bedside  Labs/tests ordered: weekly BMP x 4 weeks.  [name] Door, DO  [facility] [phone]      [1]  Allergies Allergen Reactions   Levaquin [Levofloxacin In D5w] Anaphylaxis and Swelling   Iodinated Contrast Media Itching    Severe itching   Other     Dust mites and opiates (all of them)   Latex Dermatitis

## 2024-07-16 NOTE — Assessment & Plan Note (Signed)
 07/16/2024 patient was just released from the hospital on 07/12/2024.  She remains on Zyprexa  5 mg daily.  She is also on chronically on Valium  10 mg 4 times a day.  Given her recent 3 hospitalizations in the last 2 months, we will readdress any further dose reductions or antipsychotics in January 2026.

## 2024-07-16 NOTE — Assessment & Plan Note (Signed)
 DNR/DNI. Confirmed. DNR form on file.

## 2024-07-16 NOTE — Assessment & Plan Note (Signed)
 07/16/2024 chronic left wrist flexion contracture. However she is able to flex/extend at left elbow. She is able toe flex/extend/adduct/abduct at left shoulder.

## 2024-07-16 NOTE — Assessment & Plan Note (Signed)
 07/16/2024 continue with Synthroid  25 mcg daily

## 2024-07-16 NOTE — Assessment & Plan Note (Signed)
 07/16/2024 continue with crestor  5 mg daily.

## 2024-07-17 ENCOUNTER — Ambulatory Visit: Admitting: Physician Assistant

## 2024-07-17 DIAGNOSIS — Z435 Encounter for attention to cystostomy: Secondary | ICD-10-CM | POA: Diagnosis not present

## 2024-07-17 NOTE — Progress Notes (Signed)
 Suprapubic Cath Change  Patient is present today for a suprapubic catheter change due to urinary retention.  8ml of water was drained from the balloon, a 18FR foley cath was removed from the tract with out difficulty.  Site was cleaned and prepped in a sterile fashion with betadine.  A 18FR Silastic foley cath was replaced into the tract no complications were noted. Urine return was noted, 10 ml of sterile water was inflated into the balloon and a night bag was attached for drainage.  Patient tolerated well. A   Performed by: Lucie Hones, PA-C   Follow up: Return in about 4 weeks (around 08/14/2024) for SPT exchange.

## 2024-07-22 ENCOUNTER — Emergency Department

## 2024-07-22 ENCOUNTER — Inpatient Hospital Stay

## 2024-07-22 ENCOUNTER — Other Ambulatory Visit: Payer: Self-pay

## 2024-07-22 ENCOUNTER — Inpatient Hospital Stay
Admission: EM | Admit: 2024-07-22 | Discharge: 2024-07-26 | Disposition: A | Discharge status: Home / Self Care | Attending: Osteopathic Medicine | Admitting: Osteopathic Medicine

## 2024-07-22 DIAGNOSIS — R9431 Abnormal electrocardiogram [ECG] [EKG]: Secondary | ICD-10-CM

## 2024-07-22 DIAGNOSIS — E119 Type 2 diabetes mellitus without complications: Secondary | ICD-10-CM | POA: Diagnosis present

## 2024-07-22 DIAGNOSIS — E782 Mixed hyperlipidemia: Secondary | ICD-10-CM | POA: Diagnosis present

## 2024-07-22 DIAGNOSIS — Z79899 Other long term (current) drug therapy: Secondary | ICD-10-CM | POA: Diagnosis not present

## 2024-07-22 DIAGNOSIS — E66811 Obesity, class 1: Secondary | ICD-10-CM | POA: Diagnosis present

## 2024-07-22 DIAGNOSIS — Z888 Allergy status to other drugs, medicaments and biological substances status: Secondary | ICD-10-CM

## 2024-07-22 DIAGNOSIS — Z86711 Personal history of pulmonary embolism: Secondary | ICD-10-CM

## 2024-07-22 DIAGNOSIS — Z9359 Other cystostomy status: Secondary | ICD-10-CM

## 2024-07-22 DIAGNOSIS — H47611 Cortical blindness, right side of brain: Secondary | ICD-10-CM | POA: Diagnosis present

## 2024-07-22 DIAGNOSIS — E872 Acidosis, unspecified: Secondary | ICD-10-CM | POA: Diagnosis present

## 2024-07-22 DIAGNOSIS — J189 Pneumonia, unspecified organism: Secondary | ICD-10-CM | POA: Diagnosis present

## 2024-07-22 DIAGNOSIS — I10 Essential (primary) hypertension: Secondary | ICD-10-CM | POA: Diagnosis present

## 2024-07-22 DIAGNOSIS — Z91040 Latex allergy status: Secondary | ICD-10-CM

## 2024-07-22 DIAGNOSIS — J90 Pleural effusion, not elsewhere classified: Secondary | ICD-10-CM | POA: Diagnosis present

## 2024-07-22 DIAGNOSIS — F411 Generalized anxiety disorder: Secondary | ICD-10-CM | POA: Diagnosis present

## 2024-07-22 DIAGNOSIS — I252 Old myocardial infarction: Secondary | ICD-10-CM

## 2024-07-22 DIAGNOSIS — I5042 Chronic combined systolic (congestive) and diastolic (congestive) heart failure: Secondary | ICD-10-CM | POA: Diagnosis present

## 2024-07-22 DIAGNOSIS — Y95 Nosocomial condition: Secondary | ICD-10-CM | POA: Diagnosis present

## 2024-07-22 DIAGNOSIS — Z83438 Family history of other disorder of lipoprotein metabolism and other lipidemia: Secondary | ICD-10-CM

## 2024-07-22 DIAGNOSIS — I110 Hypertensive heart disease with heart failure: Secondary | ICD-10-CM | POA: Diagnosis present

## 2024-07-22 DIAGNOSIS — H47612 Cortical blindness, left side of brain: Secondary | ICD-10-CM | POA: Diagnosis present

## 2024-07-22 DIAGNOSIS — J9611 Chronic respiratory failure with hypoxia: Secondary | ICD-10-CM | POA: Diagnosis present

## 2024-07-22 DIAGNOSIS — F331 Major depressive disorder, recurrent, moderate: Secondary | ICD-10-CM | POA: Diagnosis present

## 2024-07-22 DIAGNOSIS — K219 Gastro-esophageal reflux disease without esophagitis: Secondary | ICD-10-CM | POA: Diagnosis present

## 2024-07-22 DIAGNOSIS — Z1152 Encounter for screening for COVID-19: Secondary | ICD-10-CM | POA: Diagnosis not present

## 2024-07-22 DIAGNOSIS — Z8249 Family history of ischemic heart disease and other diseases of the circulatory system: Secondary | ICD-10-CM

## 2024-07-22 DIAGNOSIS — Z79890 Hormone replacement therapy (postmenopausal): Secondary | ICD-10-CM | POA: Diagnosis not present

## 2024-07-22 DIAGNOSIS — E876 Hypokalemia: Secondary | ICD-10-CM | POA: Diagnosis present

## 2024-07-22 DIAGNOSIS — Z7982 Long term (current) use of aspirin: Secondary | ICD-10-CM | POA: Diagnosis not present

## 2024-07-22 DIAGNOSIS — Z91041 Radiographic dye allergy status: Secondary | ICD-10-CM

## 2024-07-22 DIAGNOSIS — Z6833 Body mass index (BMI) 33.0-33.9, adult: Secondary | ICD-10-CM

## 2024-07-22 DIAGNOSIS — Z66 Do not resuscitate: Secondary | ICD-10-CM | POA: Diagnosis present

## 2024-07-22 DIAGNOSIS — G4733 Obstructive sleep apnea (adult) (pediatric): Secondary | ICD-10-CM | POA: Diagnosis present

## 2024-07-22 DIAGNOSIS — E039 Hypothyroidism, unspecified: Secondary | ICD-10-CM | POA: Diagnosis present

## 2024-07-22 DIAGNOSIS — I69354 Hemiplegia and hemiparesis following cerebral infarction affecting left non-dominant side: Secondary | ICD-10-CM

## 2024-07-22 DIAGNOSIS — A419 Sepsis, unspecified organism: Secondary | ICD-10-CM | POA: Diagnosis present

## 2024-07-22 DIAGNOSIS — I2510 Atherosclerotic heart disease of native coronary artery without angina pectoris: Secondary | ICD-10-CM | POA: Diagnosis present

## 2024-07-22 DIAGNOSIS — Z833 Family history of diabetes mellitus: Secondary | ICD-10-CM

## 2024-07-22 DIAGNOSIS — R7989 Other specified abnormal findings of blood chemistry: Secondary | ICD-10-CM

## 2024-07-22 DIAGNOSIS — I5022 Chronic systolic (congestive) heart failure: Secondary | ICD-10-CM | POA: Diagnosis present

## 2024-07-22 DIAGNOSIS — Z86718 Personal history of other venous thrombosis and embolism: Secondary | ICD-10-CM

## 2024-07-22 DIAGNOSIS — Z823 Family history of stroke: Secondary | ICD-10-CM

## 2024-07-22 DIAGNOSIS — R0603 Acute respiratory distress: Principal | ICD-10-CM

## 2024-07-22 DIAGNOSIS — J9601 Acute respiratory failure with hypoxia: Secondary | ICD-10-CM | POA: Diagnosis present

## 2024-07-22 DIAGNOSIS — I509 Heart failure, unspecified: Secondary | ICD-10-CM

## 2024-07-22 DIAGNOSIS — I25110 Atherosclerotic heart disease of native coronary artery with unstable angina pectoris: Secondary | ICD-10-CM | POA: Diagnosis present

## 2024-07-22 DIAGNOSIS — M2459 Contracture, other specified joint: Secondary | ICD-10-CM | POA: Diagnosis present

## 2024-07-22 DIAGNOSIS — H47619 Cortical blindness, unspecified side of brain: Secondary | ICD-10-CM | POA: Diagnosis present

## 2024-07-22 LAB — COMPREHENSIVE METABOLIC PANEL WITH GFR
ALT: 9 U/L (ref 0–44)
AST: 18 U/L (ref 15–41)
Albumin: 4.3 g/dL (ref 3.5–5.0)
Alkaline Phosphatase: 101 U/L (ref 38–126)
Anion gap: 18 — ABNORMAL HIGH (ref 5–15)
BUN: 9 mg/dL (ref 8–23)
CO2: 24 mmol/L (ref 22–32)
Calcium: 9.4 mg/dL (ref 8.9–10.3)
Chloride: 92 mmol/L — ABNORMAL LOW (ref 98–111)
Creatinine, Ser: 1.13 mg/dL — ABNORMAL HIGH (ref 0.44–1.00)
GFR, Estimated: 54 mL/min — ABNORMAL LOW
Glucose, Bld: 292 mg/dL — ABNORMAL HIGH (ref 70–99)
Potassium: 3.4 mmol/L — ABNORMAL LOW (ref 3.5–5.1)
Sodium: 134 mmol/L — ABNORMAL LOW (ref 135–145)
Total Bilirubin: 0.6 mg/dL (ref 0.0–1.2)
Total Protein: 7.7 g/dL (ref 6.5–8.1)

## 2024-07-22 LAB — URINALYSIS, W/ REFLEX TO CULTURE (INFECTION SUSPECTED)
Bilirubin Urine: NEGATIVE
Glucose, UA: NEGATIVE mg/dL
Ketones, ur: NEGATIVE mg/dL
Nitrite: POSITIVE — AB
Protein, ur: NEGATIVE mg/dL
Specific Gravity, Urine: 1.024 (ref 1.005–1.030)
Squamous Epithelial / HPF: 0 /HPF (ref 0–5)
pH: 7 (ref 5.0–8.0)

## 2024-07-22 LAB — CBC WITH DIFFERENTIAL/PLATELET
Abs Immature Granulocytes: 0.21 K/uL — ABNORMAL HIGH (ref 0.00–0.07)
Basophils Absolute: 0.1 K/uL (ref 0.0–0.1)
Basophils Relative: 1 %
Eosinophils Absolute: 0.2 K/uL (ref 0.0–0.5)
Eosinophils Relative: 1 %
HCT: 45.3 % (ref 36.0–46.0)
Hemoglobin: 14.2 g/dL (ref 12.0–15.0)
Immature Granulocytes: 1 %
Lymphocytes Relative: 7 %
Lymphs Abs: 1.2 K/uL (ref 0.7–4.0)
MCH: 26.8 pg (ref 26.0–34.0)
MCHC: 31.3 g/dL (ref 30.0–36.0)
MCV: 85.5 fL (ref 80.0–100.0)
Monocytes Absolute: 0.9 K/uL (ref 0.1–1.0)
Monocytes Relative: 5 %
Neutro Abs: 14.3 K/uL — ABNORMAL HIGH (ref 1.7–7.7)
Neutrophils Relative %: 85 %
Platelets: 551 K/uL — ABNORMAL HIGH (ref 150–400)
RBC: 5.3 MIL/uL — ABNORMAL HIGH (ref 3.87–5.11)
RDW: 15.1 % (ref 11.5–15.5)
WBC: 16.8 K/uL — ABNORMAL HIGH (ref 4.0–10.5)
nRBC: 0 % (ref 0.0–0.2)

## 2024-07-22 LAB — LIPASE, BLOOD: Lipase: 33 U/L (ref 11–51)

## 2024-07-22 LAB — BLOOD GAS, VENOUS
Acid-base deficit: 0.4 mmol/L (ref 0.0–2.0)
Bicarbonate: 26.7 mmol/L (ref 20.0–28.0)
O2 Saturation: 86.9 %
Patient temperature: 37
pCO2, Ven: 53 mmHg (ref 44–60)
pH, Ven: 7.31 (ref 7.25–7.43)
pO2, Ven: 58 mmHg — ABNORMAL HIGH (ref 32–45)

## 2024-07-22 LAB — RESP PANEL BY RT-PCR (RSV, FLU A&B, COVID)  RVPGX2
Influenza A by PCR: NEGATIVE
Influenza B by PCR: NEGATIVE
Resp Syncytial Virus by PCR: NEGATIVE
SARS Coronavirus 2 by RT PCR: NEGATIVE

## 2024-07-22 LAB — PROTIME-INR
INR: 1 (ref 0.8–1.2)
Prothrombin Time: 14 s (ref 11.4–15.2)

## 2024-07-22 LAB — CBC
HCT: 36.3 % (ref 36.0–46.0)
Hemoglobin: 11.6 g/dL — ABNORMAL LOW (ref 12.0–15.0)
MCH: 27.1 pg (ref 26.0–34.0)
MCHC: 32 g/dL (ref 30.0–36.0)
MCV: 84.8 fL (ref 80.0–100.0)
Platelets: 369 K/uL (ref 150–400)
RBC: 4.28 MIL/uL (ref 3.87–5.11)
RDW: 15.1 % (ref 11.5–15.5)
WBC: 10.7 K/uL — ABNORMAL HIGH (ref 4.0–10.5)
nRBC: 0 % (ref 0.0–0.2)

## 2024-07-22 LAB — D-DIMER, QUANTITATIVE: D-Dimer, Quant: 1.99 ug{FEU}/mL — ABNORMAL HIGH (ref 0.00–0.50)

## 2024-07-22 LAB — PROCALCITONIN: Procalcitonin: 0.61 ng/mL

## 2024-07-22 LAB — MRSA NEXT GEN BY PCR, NASAL: MRSA by PCR Next Gen: DETECTED — AB

## 2024-07-22 LAB — MAGNESIUM: Magnesium: 2 mg/dL (ref 1.7–2.4)

## 2024-07-22 LAB — TROPONIN T, HIGH SENSITIVITY
Troponin T High Sensitivity: 57 ng/L — ABNORMAL HIGH (ref 0–19)
Troponin T High Sensitivity: 65 ng/L — ABNORMAL HIGH (ref 0–19)

## 2024-07-22 LAB — LACTIC ACID, PLASMA
Lactic Acid, Venous: 2.8 mmol/L (ref 0.5–1.9)
Lactic Acid, Venous: 1.5 mmol/L (ref 0.5–1.9)

## 2024-07-22 LAB — PRO BRAIN NATRIURETIC PEPTIDE: Pro Brain Natriuretic Peptide: 9313 pg/mL — ABNORMAL HIGH

## 2024-07-22 LAB — CREATININE, SERUM
Creatinine, Ser: 0.91 mg/dL (ref 0.44–1.00)
GFR, Estimated: >60 mL/min

## 2024-07-22 MED ORDER — SODIUM CHLORIDE 0.9 % IV SOLN
2.0000 g | Freq: Once | INTRAVENOUS | Status: AC
  Administered 2024-07-22: 2 g via INTRAVENOUS
  Filled 2024-07-22: qty 12.5

## 2024-07-22 MED ORDER — ISOSORBIDE MONONITRATE ER 30 MG PO TB24
60.0000 mg | ORAL_TABLET | Freq: Every day | ORAL | Status: DC
  Administered 2024-07-23 – 2024-07-26 (×4): 60 mg via ORAL
  Filled 2024-07-22 (×2): qty 2
  Filled 2024-07-22: qty 1
  Filled 2024-07-22: qty 2

## 2024-07-22 MED ORDER — OLANZAPINE 5 MG PO TABS
5.0000 mg | ORAL_TABLET | Freq: Every day | ORAL | Status: DC
  Administered 2024-07-22 – 2024-07-26 (×5): 5 mg via ORAL
  Filled 2024-07-22 (×5): qty 1

## 2024-07-22 MED ORDER — IOHEXOL 350 MG/ML SOLN
75.0000 mL | Freq: Once | INTRAVENOUS | Status: AC | PRN
  Administered 2024-07-22: 75 mL via INTRAVENOUS

## 2024-07-22 MED ORDER — VANCOMYCIN HCL IN DEXTROSE 1-5 GM/200ML-% IV SOLN
1000.0000 mg | Freq: Once | INTRAVENOUS | Status: AC
  Administered 2024-07-22: 1000 mg via INTRAVENOUS
  Filled 2024-07-22: qty 200

## 2024-07-22 MED ORDER — DIPHENHYDRAMINE HCL 50 MG/ML IJ SOLN
50.0000 mg | Freq: Once | INTRAMUSCULAR | Status: AC
  Administered 2024-07-22: 50 mg via INTRAVENOUS
  Filled 2024-07-22: qty 1

## 2024-07-22 MED ORDER — POTASSIUM CHLORIDE 10 MEQ/100ML IV SOLN
10.0000 meq | INTRAVENOUS | Status: DC
  Filled 2024-07-22: qty 100

## 2024-07-22 MED ORDER — DIAZEPAM 5 MG PO TABS
10.0000 mg | ORAL_TABLET | Freq: Four times a day (QID) | ORAL | Status: DC | PRN
  Administered 2024-07-24: 10 mg via ORAL
  Filled 2024-07-22: qty 2

## 2024-07-22 MED ORDER — SENNOSIDES-DOCUSATE SODIUM 8.6-50 MG PO TABS
2.0000 | ORAL_TABLET | Freq: Every day | ORAL | Status: DC
  Administered 2024-07-22 – 2024-07-25 (×4): 2 via ORAL
  Filled 2024-07-22 (×4): qty 2

## 2024-07-22 MED ORDER — AMLODIPINE BESYLATE 5 MG PO TABS
2.5000 mg | ORAL_TABLET | Freq: Every day | ORAL | Status: DC
  Administered 2024-07-22: 2.5 mg via ORAL
  Filled 2024-07-22: qty 1

## 2024-07-22 MED ORDER — DIPHENHYDRAMINE HCL 25 MG PO CAPS: 50.0000 mg | ORAL_CAPSULE | Freq: Once | ORAL | Status: AC

## 2024-07-22 MED ORDER — POLYETHYLENE GLYCOL 3350 17 G PO PACK
17.0000 g | PACK | Freq: Every day | ORAL | Status: DC
  Administered 2024-07-22 – 2024-07-23 (×2): 17 g via ORAL
  Filled 2024-07-22 (×3): qty 1

## 2024-07-22 MED ORDER — LEVOTHYROXINE SODIUM 25 MCG PO TABS
25.0000 ug | ORAL_TABLET | Freq: Every day | ORAL | Status: DC
  Administered 2024-07-23 – 2024-07-26 (×4): 25 ug via ORAL
  Filled 2024-07-22 (×4): qty 1

## 2024-07-22 MED ORDER — FUROSEMIDE 40 MG PO TABS
40.0000 mg | ORAL_TABLET | Freq: Two times a day (BID) | ORAL | Status: DC
  Administered 2024-07-23 – 2024-07-26 (×7): 40 mg via ORAL
  Filled 2024-07-22 (×8): qty 1

## 2024-07-22 MED ORDER — METOPROLOL SUCCINATE ER 50 MG PO TB24
25.0000 mg | ORAL_TABLET | Freq: Every day | ORAL | Status: DC
  Administered 2024-07-22: 25 mg via ORAL
  Filled 2024-07-22: qty 1

## 2024-07-22 MED ORDER — PANTOPRAZOLE SODIUM 40 MG PO TBEC
40.0000 mg | DELAYED_RELEASE_TABLET | Freq: Two times a day (BID) | ORAL | Status: DC
  Administered 2024-07-22 – 2024-07-26 (×8): 40 mg via ORAL
  Filled 2024-07-22 (×8): qty 1

## 2024-07-22 MED ORDER — LOSARTAN POTASSIUM 50 MG PO TABS
100.0000 mg | ORAL_TABLET | Freq: Every day | ORAL | Status: DC
  Administered 2024-07-22: 100 mg via ORAL
  Filled 2024-07-22: qty 2

## 2024-07-22 MED ORDER — ROSUVASTATIN CALCIUM 10 MG PO TABS
5.0000 mg | ORAL_TABLET | Freq: Every day | ORAL | Status: DC
  Administered 2024-07-23 – 2024-07-25 (×3): 5 mg via ORAL
  Filled 2024-07-22 (×4): qty 1

## 2024-07-22 MED ORDER — GUAIFENESIN 100 MG/5ML PO LIQD
5.0000 mL | ORAL | Status: DC | PRN
  Administered 2024-07-24: 5 mL via ORAL
  Filled 2024-07-22: qty 10

## 2024-07-22 MED ORDER — ALBUTEROL SULFATE (2.5 MG/3ML) 0.083% IN NEBU
INHALATION_SOLUTION | RESPIRATORY_TRACT | Status: AC
  Administered 2024-07-22: 2.5 mg
  Filled 2024-07-22: qty 3

## 2024-07-22 MED ORDER — POTASSIUM CHLORIDE 10 MEQ/100ML IV SOLN
10.0000 meq | INTRAVENOUS | Status: AC
  Administered 2024-07-22 (×2): 10 meq via INTRAVENOUS
  Filled 2024-07-22: qty 100

## 2024-07-22 MED ORDER — METHYLPREDNISOLONE SODIUM SUCC 40 MG IJ SOLR
40.0000 mg | Freq: Once | INTRAMUSCULAR | Status: AC
  Administered 2024-07-22: 40 mg via INTRAVENOUS
  Filled 2024-07-22: qty 1

## 2024-07-22 MED ORDER — SODIUM CHLORIDE 0.9 % IV SOLN
2.0000 g | Freq: Two times a day (BID) | INTRAVENOUS | Status: DC
  Administered 2024-07-22 – 2024-07-23 (×3): 2 g via INTRAVENOUS
  Filled 2024-07-22 (×3): qty 12.5

## 2024-07-22 MED ORDER — SPIRONOLACTONE 12.5 MG HALF TABLET
12.5000 mg | ORAL_TABLET | Freq: Every day | ORAL | Status: DC
  Administered 2024-07-23 – 2024-07-26 (×4): 12.5 mg via ORAL
  Filled 2024-07-22 (×4): qty 1

## 2024-07-22 MED ORDER — FUROSEMIDE 10 MG/ML IJ SOLN
40.0000 mg | Freq: Once | INTRAMUSCULAR | Status: AC
  Administered 2024-07-22: 40 mg via INTRAVENOUS
  Filled 2024-07-22: qty 4

## 2024-07-22 MED ORDER — VANCOMYCIN HCL 750 MG/150ML IV SOLN
750.0000 mg | Freq: Once | INTRAVENOUS | Status: AC
  Administered 2024-07-22: 750 mg via INTRAVENOUS
  Filled 2024-07-22: qty 150

## 2024-07-22 MED ORDER — ASPIRIN 81 MG PO TBEC
81.0000 mg | DELAYED_RELEASE_TABLET | Freq: Every day | ORAL | Status: DC
  Administered 2024-07-22 – 2024-07-26 (×5): 81 mg via ORAL
  Filled 2024-07-22 (×5): qty 1

## 2024-07-22 MED ORDER — LACTATED RINGERS IV SOLN: INTRAVENOUS | Status: DC

## 2024-07-22 MED ORDER — RANOLAZINE ER 500 MG PO TB12
500.0000 mg | ORAL_TABLET | Freq: Two times a day (BID) | ORAL | Status: DC
  Administered 2024-07-22 – 2024-07-26 (×8): 500 mg via ORAL
  Filled 2024-07-22 (×10): qty 1

## 2024-07-22 MED ORDER — IPRATROPIUM-ALBUTEROL 0.5-2.5 (3) MG/3ML IN SOLN
3.0000 mL | Freq: Once | RESPIRATORY_TRACT | Status: AC
  Administered 2024-07-22: 3 mL via RESPIRATORY_TRACT
  Filled 2024-07-22: qty 3

## 2024-07-22 MED ORDER — VANCOMYCIN HCL IN DEXTROSE 1-5 GM/200ML-% IV SOLN: 1000.0000 mg | INTRAVENOUS | Status: DC

## 2024-07-22 MED ORDER — SODIUM CHLORIDE 0.9 % IV SOLN
100.0000 mg | Freq: Two times a day (BID) | INTRAVENOUS | Status: DC
  Administered 2024-07-22 – 2024-07-23 (×2): 100 mg via INTRAVENOUS
  Filled 2024-07-22 (×4): qty 100

## 2024-07-22 MED ORDER — SODIUM CHLORIDE 0.9 % IV SOLN: 2.0000 g | INTRAVENOUS | Status: DC

## 2024-07-22 MED ORDER — ENOXAPARIN SODIUM 40 MG/0.4ML IJ SOSY
0.5000 mg/kg | PREFILLED_SYRINGE | INTRAMUSCULAR | Status: DC
  Administered 2024-07-22 – 2024-07-23 (×2): 40 mg via SUBCUTANEOUS
  Filled 2024-07-22 (×2): qty 0.4

## 2024-07-22 NOTE — ED Notes (Signed)
 Called CCMD to initiate cardiac monitoring.

## 2024-07-22 NOTE — Progress Notes (Signed)
 Pharmacy Antibiotic Note  Victoria Medina is a 63 y.o. female admitted on 07/22/2024 with pneumonia. PMH significant for HFrEF, CAD, stroke, bilateral blindness, anxiety, depression, hypothyroid, morbid obesity, history of DVT, PE no longer on anticoagulation, left upper extremity contractures, frequent hospitalization, chronic suprapubic catheter. Patient was recently admitted from 12/8-12/13 for hyponatremia and CHF. Now presenting with SOB and concern for PNA. Pharmacy has been consulted for Vancomycin  dosing.  12/23: Patient's Scr is 1.13 and slightly elevated from BL ~ 0.7-1. WBC also elevated at 16.8. Patient remains afebrile. Will continue to monitor.   Plan: - Patient received Vancomycin  1000 mg IV loading dose x 1, ordered by ED provider - Will order additional Vancomycin  750 mg IV dose x 1 to complete a total loading dose of 1750 mg  - Will start Vancomycin  maintenance dose at 1000 mg q24h (eAUC 471.6, Cmin 12.8, Scr 1.13, IBW used, Vd 0.72 L/kg) - Goal AUC 400-550 - Will continue to monitor renal function and signs of clinical improvement   Height: 5' 1 (154.9 cm) Weight: 81.1 kg (178 lb 12.7 oz) IBW/kg (Calculated) : 47.8  Temp (24hrs), Avg:98.3 F (36.8 C), Min:98.3 F (36.8 C), Max:98.3 F (36.8 C)  Recent Labs  Lab 07/22/24 0358 07/22/24 0715  WBC 16.8*  --   CREATININE 1.13*  --   LATICACIDVEN 2.8* 1.5    Estimated Creatinine Clearance: 49.2 mL/min (A) (by C-G formula based on SCr of 1.13 mg/dL (H)).    Allergies[1]  Antimicrobials this admission: Cefepime  12/23 >> Vancomycin  12/23 >>   Dose adjustments this admission:   Microbiology results: 12/23 BCx: NGTD 12/23 MRSA PCR: ordered  Thank you for allowing pharmacy to be a part of this patients care.   Ransom Blanch PGY-1 Pharmacy Resident  [facility]  07/22/2024 11:49 AM      [1]  Allergies Allergen Reactions   Levaquin [Levofloxacin In D5w] Anaphylaxis and Swelling   Iodinated  Contrast Media Itching    Severe itching   Other     Dust mites and opiates (all of them)   Latex Dermatitis

## 2024-07-22 NOTE — ED Notes (Signed)
 At this time, this EDT and Delon RN helped this pt get changed after soiling brief. Pt was provided with peri care, full linen change and new chux. Pt is now resting in bed, no other needs at this time.

## 2024-07-22 NOTE — H&P (Addendum)
 "  History and Physical    Victoria Medina FMW:[id number] DOB: Feb 07, 1961 DOA: 07/22/2024  DOS: the patient was seen and examined on 07/22/2024  PCP: Victoria Locus, DO   Patient coming from: SNF  I have personally briefly reviewed patient's old medical records in [facility]  Chief Complaint: Cough and shortness of breath for a week  HPI: Bre Victoria Medina is a pleasant 63 y.o. 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  Our service is known to this patient due to frequent admission.  She stated that she has been coughing for the last 1 week and was prescribed cough medications.  She did not get better rather she got worse with her shortness of breath to the point that she was not able to breathe this morning. Per EMS patient was hypoxic to 60%, oxygen level did not improve and placed her on CPAP. Patient was discharged to nursing home after being admitted to hospital between 12/8 to 12/13 for severe hyponatremia and congestive heart failure. Patient denies any fever, chills, nausea, vomiting.  She stated that she has been taking her Lasix  and there is no leg swelling.  She denies any chest pain, palpitations.  ED Course: Upon arrival to the ED, patient is found to be tachycardic at 107, tachypneic at 39 hypoxic at 80% on room air placed on BiPAP.  EKG showed sinus rhythm at 93 bpm, no ST elevation, chest x-ray showed pleural effusion.  Patient had a leukocytosis at 16.8 lactate was 2.8, sodium was 134, potassium 3.4, creatinine was 1.13, ABG showed pH of 7.31, pCO2 of 53, pO2 was 58.  Patient was given cefepime  and vancomycin , 40 mg of Lasix  IV,  Solu-Medrol  40 mg IV, replace potassium 10 mEq.  Hospitalist service was consulted for evaluation for admission for possible congestive heart failure or pneumonia.  Review of Systems:  ROS  All other systems negative except as noted in the HPI.  Past Medical History:  Diagnosis Date   Acute ST elevation myocardial infarction (STEMI) of inferolateral wall (HCC) 01/25/2020   Blind    Cervical intraepithelial glandular neoplasia 02/07/2011   Cholecystitis 04/22/2021   DVT (deep venous thrombosis) (HCC) 04/03/2012   History of migraine headaches    History of pulmonary embolism 04/02/2012   September 2013 after knee injury- hyperextension- . U/S of lower extremity revealed DVT in L leg. CTA revealed bilateral lower lobe segmental PEs and R upper lobe segmental PE. Patient was started on lovenox  and transferred upstairs. Cardiac enzymes were negative x1. NOAC for 7 months.         History of stroke 10/09/2021   Hyperlipemia    Myocardial infarction [facility])    NSTEMI (non-ST elevated myocardial infarction) (HCC) 10/27/2013   Formatting of this note might be different from the original.  01/08/2014: LHC: 2nd Marginal: 99% (pre) to 0% (post); STENT, PROMUS PREMIER MR 2.25X12MM     12/04/2013: LHC: Distal LAD: 70% (pre) to 0% (post) CATH, MINI TREK RX 2.0X20MM STENT, PROMUS PREMIER MR 2.25X28MM; Mid LAD: 70% (pre) to 0% (post), STENT, PROMUS PREMIER MR 2.50X12MM     10/27/2013. LHC: 3 vessel Disease, Stent x2:  Proximal LAD:   Stroke [facility])     Past Surgical History:  Procedure  Laterality Date   ABDOMINAL HYSTERECTOMY     BACK SURGERY     CARDIAC CATHETERIZATION     CORONARY/GRAFT ACUTE MI REVASCULARIZATION N/A 01/25/2020   Procedure: Coronary/Graft Acute MI Revascularization;  Surgeon: [name], MD;  Location: [facility] INVASIVE CV LAB;  Service: Cardiovascular;  Laterality: N/A;   FRACTURE SURGERY     IR CATHETER TUBE CHANGE  08/10/2021   LEFT HEART CATH AND CORONARY ANGIOGRAPHY N/A 01/25/2020    Procedure: LEFT HEART CATH AND CORONARY ANGIOGRAPHY;  Surgeon: [name], MD;  Location: [facility] INVASIVE CV LAB;  Service: Cardiovascular;  Laterality: N/A;   SKIN GRAFT Right    TOTAL ABDOMINAL HYSTERECTOMY Bilateral      reports that she has never smoked. She has never used smokeless tobacco. She reports current alcohol use. She reports that she does not use drugs.  Allergies[1]  Family History  Problem Relation Age of Onset   Heart attack Mother    Hypertension Mother    Hypercholesterolemia Mother    Diabetes Mother    Stroke Father    Heart attack Father    Hypertension Father    Hypercholesterolemia Father    Diabetes Father    Diabetes Maternal Grandmother    Cancer - Other Maternal Grandmother     Prior to Admission medications  Medication Sig Start Date End Date Taking? Authorizing Provider  amLODipine  (NORVASC ) 2.5 MG tablet Take 2.5 mg by mouth daily.   Yes [provider]  aspirin  EC 81 MG tablet Take 81 mg by mouth daily.   Yes [provider]  Cholecalciferol (VITAMIN D3) 1.25 MG (50000 UT) CAPS Take 1 capsule by mouth once a week.  every Mon for Supplement   Yes [provider]  Dextromethorphan-Benzocaine (COUGH/SORE THROAT LOZENGES MT) Use as directed 1 drop in the mouth or throat every 4 (four) hours as needed.   Yes [provider]  diazepam  (VALIUM ) 10 MG tablet Take 10 mg by mouth every 6 (six) hours as needed for anxiety.   Yes [provider]  fluticasone  (FLONASE ) 50 MCG/ACT nasal spray Place 1 spray into both nostrils 2 (two) times daily.   Yes [provider]  furosemide  (LASIX ) 40 MG tablet Take 1 tablet (40 mg total) by mouth daily. 06/17/24 07/22/24 Yes Trudy Anthony HERO, MD  ibuprofen  (ADVIL ) 800 MG tablet Take 800 mg by mouth 2 (two) times daily as needed for mild pain (pain score 1-3) or moderate pain (pain score 4-6).   Yes [provider]  isosorbide  mononitrate (IMDUR ) 60 MG  24 hr tablet Take 60 mg by mouth daily.   Yes [provider]  lansoprazole (PREVACID) 15 MG capsule Take 15 mg by mouth 2 (two) times daily before a meal.   Yes [provider]  levothyroxine  (SYNTHROID ) 25 MCG tablet Take 25 mcg by mouth daily before breakfast.   Yes [provider]  losartan  (COZAAR ) 100 MG tablet Take 100 mg by mouth daily.   Yes [provider]  magnesium  hydroxide (MILK OF MAGNESIA) 400 MG/5ML suspension Take 5 mLs by mouth every 6 (six) hours as needed for mild constipation.   Yes [provider]  Menthol (COUGH DROPS) 2.7 MG LOZG 1 tablet by Transmucosal route every 4 (four) hours as needed.   Yes [provider]  metoprolol  succinate (TOPROL -XL) 25 MG 24 hr tablet Take 25 mg by mouth daily. 07/01/24 07/01/25 Yes [provider]  nitroGLYCERIN  (NITROSTAT ) 0.4 MG SL tablet Place 0.4  mg under the tongue every 5 (five) minutes x 3 doses as needed for chest pain.    Yes [provider]  OLANZapine  (ZYPREXA ) 5 MG tablet Take 5 mg by mouth daily.   Yes [provider]  ondansetron  (ZOFRAN -ODT) 4 MG disintegrating tablet Take 4 mg by mouth every 4 (four) hours as needed for nausea or vomiting.   Yes [provider]  polyethylene glycol (MIRALAX  / GLYCOLAX ) 17 g packet Take 17 g by mouth daily.   Yes [provider]  promethazine  (PHENERGAN ) 12.5 MG suppository Place 12.5 mg rectally every 6 (six) hours as needed.   Yes [provider]  ranolazine  (RANEXA ) 500 MG 12 hr tablet Take 500 mg by mouth 2 (two) times daily.   Yes [provider]  rosuvastatin  (CRESTOR ) 5 MG tablet Take 5 mg by mouth daily. Every Tuesday,Thursday,Sat   Yes [provider]  senna-docusate (SENOKOT-S) 8.6-50 MG tablet Take 2 tablets by mouth at bedtime.   Yes [provider]  Vibegron  (GEMTESA ) 75 MG TABS Take 75 mg by mouth daily. 03/08/22  Yes Vaillancourt, Samantha, PA-C   spironolactone  (ALDACTONE ) 25 MG tablet Take 0.5 tablets (12.5 mg total) by mouth daily. 06/16/24 07/17/24  Trudy Anthony HERO, MD    Physical Exam: Vitals:   07/22/24 0530 07/22/24 0630 07/22/24 0643 07/22/24 0900  BP: 129/85 91/64  115/88  Pulse: (!) 106 95  94  Resp:  11  19  Temp:   98.3 F (36.8 C)   TempSrc:   Axillary   SpO2: (!) 87% 100%  97%  Weight:      Height:        Physical Exam   Constitutional: Alert, awake, calm, comfortable HEENT: Neck supple Respiratory: Bilateral decreased air entry at the bases, no wheezes no rales no rhonchi.  Cardiovascular: Regular rate and rhythm, no murmurs / rubs / gallops. No extremity edema. 2+ pedal pulses. No carotid bruits.  Abdomen: Soft, no tenderness, Bowel sounds positive.  Musculoskeletal: no clubbing / cyanosis. Good ROM, no contractures. Normal muscle tone.  Skin: no rashes, lesions, ulcers. Neurologic: CN 2-12 grossly intact.  Left upper extremity contracture Psychiatric: Alert and oriented x 3. Normal mood.    Labs on Admission: I have personally reviewed following labs and imaging studies  CBC: Recent Labs  Lab 07/22/24 0358  WBC 16.8*  NEUTROABS 14.3*  HGB 14.2  HCT 45.3  MCV 85.5  PLT 551*   Basic Metabolic Panel: Recent Labs  Lab 07/22/24 0358 07/22/24 0530  NA 134*  --   K 3.4*  --   CL 92*  --   CO2 24  --   GLUCOSE 292*  --   BUN 9  --   CREATININE 1.13*  --   CALCIUM  9.4  --   MG  --  2.0   GFR: Estimated Creatinine Clearance: 49.2 mL/min (A) (by C-G formula based on SCr of 1.13 mg/dL (H)). Liver Function Tests: Recent Labs  Lab 07/22/24 0358  AST 18  ALT 9  ALKPHOS 101  BILITOT 0.6  PROT 7.7  ALBUMIN 4.3   Recent Labs  Lab 07/22/24 0358  LIPASE 33   No results for input(s): AMMONIA in the last 168 hours. Coagulation Profile: Recent Labs  Lab 07/22/24 0358  INR 1.0   Cardiac Enzymes: No results for input(s): CKTOTAL, CKMB, CKMBINDEX, TROPONINI,  TROPONINIHS in the last 168 hours. BNP (last 3 results) Recent Labs    06/05/24 1034  BNP 473.9*  HbA1C: No results for input(s): HGBA1C in the last 72 hours. CBG: No results for input(s): GLUCAP in the last 168 hours. Lipid Profile: No results for input(s): CHOL, HDL, LDLCALC, TRIG, CHOLHDL, LDLDIRECT in the last 72 hours. Thyroid Function Tests: No results for input(s): TSH, T4TOTAL, FREET4, T3FREE, THYROIDAB in the last 72 hours. Anemia Panel: No results for input(s): VITAMINB12, FOLATE, FERRITIN, TIBC, IRON, RETICCTPCT in the last 72 hours. Urine analysis:    Component Value Date/Time   COLORURINE COLORLESS (A) 11/11/2023 1721   APPEARANCEUR CLEAR (A) 11/11/2023 1721   APPEARANCEUR Hazy (A) 09/05/2022 1519   LABSPEC 1.001 (L) 11/11/2023 1721   LABSPEC 1.026 08/05/2014 1906   PHURINE 8.0 11/11/2023 1721   GLUCOSEU NEGATIVE 11/11/2023 1721   GLUCOSEU NEGATIVE 08/05/2014 1906   HGBUR SMALL (A) 11/11/2023 1721   BILIRUBINUR NEGATIVE 11/11/2023 1721   BILIRUBINUR Negative 09/05/2022 1519   BILIRUBINUR NEGATIVE 08/05/2014 1906   KETONESUR NEGATIVE 11/11/2023 1721   PROTEINUR NEGATIVE 11/11/2023 1721   NITRITE NEGATIVE 11/11/2023 1721   LEUKOCYTESUR MODERATE (A) 11/11/2023 1721   LEUKOCYTESUR 2+ 08/05/2014 1906    Radiological Exams on Admission: I have personally reviewed images DG Chest Port 1 View Result Date: 07/22/2024 EXAM: 1 VIEW(S) XRAY OF THE CHEST 07/22/2024 04:21:00 AM COMPARISON: Portable chest, 07/07/2024. CLINICAL HISTORY: Questionable sepsis - evaluate for abnormality. FINDINGS: LINES, TUBES AND DEVICES: Monitor wires are present. LUNGS AND PLEURA: Increased central vascular congestion and moderate diffuse interstitial edema with a basal gradient. Increased perihilar haziness which is probably ground glass edema. Small pleural effusions are forming. No pneumothorax. No other focal airspace process is seen. HEART AND  MEDIASTINUM: Mild cardiomegaly. Calcification in the transverse aorta. Calcifications and stents in the LAD coronary artery. Unremarkable mediastinum. BONES AND SOFT TISSUES: Artifact from overlying clothing. No acute osseous abnormality. Partially visible lower cervical spine fusion plating. IMPRESSION: 1. Increased central vascular congestion and moderate diffuse interstitial edema with basal gradient, with probable ground-glass edema centrally. 2. Small pleural effusions. 3. Mild cardiomegaly. Electronically signed by: Francis Quam MD 07/22/2024 04:55 AM EST RP Workstation: [id number]    EKG: My personal interpretation of EKG shows: Sinus rhythm at 93 bpm, no ST elevation    Assessment/Plan Active Problems:   Essential hypertension   Acquired hypothyroidism   GAD (generalized anxiety disorder)   GERD (gastroesophageal reflux disease)   Obstructive sleep apnea (adult) (pediatric)   DNR (do not resuscitate)   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)   T2DM (type 2 diabetes mellitus) (HCC)   Cortical blindness   Mixed hyperlipidemia   Suprapubic catheter (HCC)   Coronary artery disease involving native coronary artery of native heart with unstable angina pectoris (HCC)   Chronic systolic CHF (congestive heart failure) (HCC) - LVEF 20-25%   Major depressive disorder, recurrent, moderate (HCC)   Acute hypoxic respiratory failure (HCC)    Assessment and Plan: 63 year old female who was recently discharged from the hospital after being treated for severe hyponatremia and CHF exacerbation.  She came back to the hospital cough and shortness of breath for a week duration.  1.  Likely right-sided pneumonia - CT angio of the chest official reading is pending. - It shows some infiltrate on the CT scan on my reading. - She has a leukocytosis and initial elevated lactate. - She was tachycardic, tachypneic and hypoxic.  She meets criteria for sepsis  due to healthcare associated pneumonia. - She will be admitted to hospital as inpatient  in the stepdown unit due to severe respiratory failure. - She has been placed in pneumonia protocol - She will be placed on oxygen, BiPAP as needed and CPAP during the night - She was started on cefepime  and vancomycin  in the emergency room. - She will be continued on doxycycline , cefepime  and vancomycin .  Vancomycin  to be dosed by the pharmacy. - Follow-up cultures.  2.  Chronic systolic and diastolic congestive heart failure. - Patient was recently admitted and discharged after being treated for CHF exacerbation. - Patient to continue Lasix  40 mg twice daily. - I see from the nursing home note that she was getting only once a day Lasix . - Patient received 1 dose of IV Lasix  in the emergency room. - She will be continued on oral 40 mg of Lasix  twice daily. - She will resume her home dose of spironolactone .  3.  Hypokalemia - Potassium was replaced in the ED - Will continue to monitor electrolytes and replace as needed.  4.  HTN - Continue amlodipine  2.5 mg, Lasix , losartan  100 mg, spironolactone , metoprolol  succinate. - Continue to monitor blood pressure and adjust medication if needed  5.  Hypothyroidism - Resume levothyroxine   6.  CAD - Continue statin, ranolazine , aspirin   7.  Anxiety/depression - Patient takes diazepam  10 mg every 6 hours as needed - Continue olanzapine  home dose  8.  GERD - Continue on Protonix       DVT prophylaxis: Lovenox  Code Status: DNR/DNI(Do NOT Intubate) Family Communication: None available Disposition Plan: Back to skilled nursing facility Consults called: None Admission status: Inpatient, Step Down Unit   Nena Rebel, MD [facility] 07/22/2024, 11:47 AM        [1]  Allergies Allergen Reactions   Levaquin [Levofloxacin In D5w] Anaphylaxis and Swelling   Iodinated Contrast Media Itching    Severe itching   Other     Dust mites  and opiates (all of them)   Latex Dermatitis   "

## 2024-07-22 NOTE — ED Notes (Signed)
 Pt bipap alarming at this time. Upon entering rm bipap reading leakage to high. Pt has been observed pulling and moving mask. Mask moved into position at this time. Bipap returned to normal functioning.

## 2024-07-22 NOTE — ED Triage Notes (Signed)
 Pt arrives via [facility] from [facility] with c/o shortness of breath.  Recently admitted for CHF. Staff went to check on pt and pt was 80% on room air.

## 2024-07-22 NOTE — ED Provider Notes (Signed)
 "  [facility] Provider Note    Event Date/Time   First MD Initiated Contact with Patient 07/22/24 [phone]     (approximate)   History   Shortness of Breath   HPI  Victoria Medina is a 63 y.o. female  with pmh history of heart failure reduced ejection fraction, CAD, stroke, bilateral blindness, anxiety, depression, hypothyroid, morbid obesity, history of DVT, PE no longer on anticoagulation, left upper extremity contractures, frequent hospitalization, chronic suprapubic catheter who presents from her skilled nursing facility for shortness of breath hypoxia and respiratory distress.  History is limited given the acuity of the situation.  Patient was checked on staff approximately 2 hours prior to EMS call and she was doing well when they returned, patient endorsed shortness of breath and was hypoxic to 60 % (in contrast to the triage note says which states 80%).  Her oxygen level did not improve with a nonrebreather fall so EMS brought her on a CPAP.  Patient denies chest pain or abdominal pain  Patient was last admitted at all facility from 12/8 to 12/13 for hyponatremia and CHF      Physical Exam   Triage Vital Signs: ED Triage Vitals  Encounter Vitals Group     BP 07/22/24 0428 (!) 135/90     Girls Systolic BP Percentile --      Girls Diastolic BP Percentile --      Boys Systolic BP Percentile --      Boys Diastolic BP Percentile --      Pulse Rate 07/22/24 0428 (!) 107     Resp 07/22/24 0428 (!) 39     Temp --      Temp src --      SpO2 07/22/24 0354 98 %     Weight 07/22/24 0355 178 lb 12.7 oz (81.1 kg)     Height 07/22/24 0355 5' 1 (1.549 m)     Head Circumference --      Peak Flow --      Pain Score --      Pain Loc --      Pain Education --      Exclude from Growth Chart --     Most recent vital signs: Vitals:   07/22/24 0630 07/22/24 0643  BP: 91/64   Pulse: 95   Resp: 11   Temp:  98.3 F (36.8 C)  SpO2: 100%     Nursing Triage Note  reviewed. Vital signs reviewed and patients oxygen saturation is hypoxic General: Patient is well nourished, well developed, awake and alert, appears unwell Head: Normocephalic and atraumatic Eyes: Normal inspection, extraocular muscles intact, no conjunctival pallor Ear, nose, throat: Normal external exam Neck: Normal range of motion Respiratory: Patient is in respiratory distress, lungs rales and wheezes Cardiovascular: Patient is tachycardic, RR GI: Abd SNT with no guarding or rebound  Suprabupic catheter in palce Extremities: pulses intact with good cap refills, no LE pitting edema or calf tenderness Neuro: The patient is alert and oriented to person, place, and time, appropriately conversive, has left-sided paralysis however and arms held and contractures Skin: Warm, dry, and intact Psych: normal mood and affect, no SI or HI  ED Results / Procedures / Treatments   Labs (all labs ordered are listed, but only abnormal results are displayed) Labs Reviewed  LACTIC ACID, PLASMA - Abnormal; Notable for the following components:      Result Value   Lactic Acid, Venous 2.8 (*)    All other components  within normal limits  COMPREHENSIVE METABOLIC PANEL WITH GFR - Abnormal; Notable for the following components:   Sodium 134 (*)    Potassium 3.4 (*)    Chloride 92 (*)    Glucose, Bld 292 (*)    Creatinine, Ser 1.13 (*)    GFR, Estimated 54 (*)    Anion gap 18 (*)    All other components within normal limits  CBC WITH DIFFERENTIAL/PLATELET - Abnormal; Notable for the following components:   WBC 16.8 (*)    RBC 5.30 (*)    Platelets 551 (*)    Neutro Abs 14.3 (*)    Abs Immature Granulocytes 0.21 (*)    All other components within normal limits  BLOOD GAS, VENOUS - Abnormal; Notable for the following components:   pO2, Ven 58 (*)    All other components within normal limits  PRO BRAIN NATRIURETIC PEPTIDE - Abnormal; Notable for the following components:   Pro Brain Natriuretic  Peptide 9,313.0 (*)    All other components within normal limits  D-DIMER, QUANTITATIVE - Abnormal; Notable for the following components:   D-Dimer, Quant 1.99 (*)    All other components within normal limits  TROPONIN T, HIGH SENSITIVITY - Abnormal; Notable for the following components:   Troponin T High Sensitivity 57 (*)    All other components within normal limits  RESP PANEL BY RT-PCR (RSV, FLU A&B, COVID)  RVPGX2  CULTURE, BLOOD (ROUTINE X 2)  CULTURE, BLOOD (ROUTINE X 2)  PROTIME-INR  LIPASE, BLOOD  LACTIC ACID, PLASMA  URINALYSIS, W/ REFLEX TO CULTURE (INFECTION SUSPECTED)  MAGNESIUM   TROPONIN T, HIGH SENSITIVITY     EKG EKG and rhythm strip are interpreted by myself:   EKG: tachycardic sinus rhythm] at heart rate of 93, normal QRS duration, QTc 534, nonspecific ST segments and T waves no ectopy EKG not consistent with Acute STEMI Rhythm strip: tachycardic sinus  in lead II   RADIOLOGY CXR: Consistent with pleural edema on my independent review interpretation radiologist agrees    PROCEDURES:  Critical Care performed: Yes, see critical care procedure note(s)  .Critical Care  Performed by: Nicholaus Rolland BRAVO, MD Authorized by: Nicholaus Rolland BRAVO, MD   Critical care provider statement:    Critical care time (minutes):  40   Critical care was necessary to treat or prevent imminent or life-threatening deterioration of the following conditions:  Respiratory failure and sepsis   Critical care was time spent personally by me on the following activities:  Development of treatment plan with patient or surrogate, discussions with consultants, evaluation of patient's response to treatment, examination of patient, ordering and review of laboratory studies, ordering and review of radiographic studies, ordering and performing treatments and interventions, pulse oximetry, re-evaluation of patient's condition and review of old charts   Care discussed with: admitting provider    Comments:     Sepsis and need for BiPAP    MEDICATIONS ORDERED IN ED: Medications  vancomycin  (VANCOCIN ) IVPB 1000 mg/200 mL premix (has no administration in time range)  methylPREDNISolone  sodium succinate  (SOLU-MEDROL ) 40 mg/mL injection 40 mg (has no administration in time range)  diphenhydrAMINE  (BENADRYL ) capsule 50 mg (has no administration in time range)    Or  diphenhydrAMINE  (BENADRYL ) injection 50 mg (has no administration in time range)  furosemide  (LASIX ) injection 40 mg (has no administration in time range)  potassium chloride  10 mEq in 100 mL IVPB (has no administration in time range)  ceFEPIme  (MAXIPIME ) 2 g in sodium chloride  0.9 %  100 mL IVPB (0 g Intravenous Stopped 07/22/24 0640)  ipratropium-albuterol  (DUONEB) 0.5-2.5 (3) MG/3ML nebulizer solution 3 mL (3 mLs Nebulization Given 07/22/24 0417)  albuterol  (PROVENTIL ) (2.5 MG/3ML) 0.083% nebulizer solution (2.5 mg  Given 07/22/24 0400)     IMPRESSION / MDM / ASSESSMENT AND PLAN / ED COURSE                                Differential diagnosis includes, but is not limited to, CHF exacerbation, PE, pneumonia, sepsis, arrhythmia, electrolyte derangement, anemia   ED course: Patient arrives acutely on CPAP in respiratory distress and was quickly moved over to BiPAP with increased comfort.  Pulmonary exam consistent with rales and mild wheezes.  Chest x-ray consistent with pleural edema.  Patient's BNP very elevated and she was given a dose of Lasix  IV with potassium repletion.  Patient does have an elevated lactic acid and elevated white blood cell count with tachycardia and increased respiratory rate and does meet sepsis criteria.  I have treated her for HCAP.  D-dimer is elevated but patient does have a contrast allergy.  She Victoria require premedication.  Victoria discuss admission with the hospitalist   Clinical Course as of 07/22/24 0646  Tue Jul 22, 2024  0434 BP(!): 135/90 [HD]  0457 DG Chest Port 1 View [HD]  0510  WBC(!): 16.8 Elevated [HD]  0534 Blood gas, venous(!) No acidosis [HD]  0600 Lactic Acid, Venous(!!): 2.8 Elevated [HD]  0600 D-Dimer, Quant(!): 1.99 Was sent for CT PE [HD]  0602 Pro Brain Natriuretic Peptide(!): 9,313.0 Elevated [HD]  0641 Case discussed with hospitalist for admission [HD]    Clinical Course User Index [HD] Nicholaus Rolland BRAVO, MD   -- Risk: 5 This patient has a high risk of morbidity due to further diagnostic testing or treatment. Rationale: This patients evaluation and management involve a high risk of morbidity due to the potential severity of presenting symptoms, need for diagnostic testing, and/or initiation of treatment that may require close monitoring. The differential includes conditions with potential for significant deterioration or requiring escalation of care. Treatment decisions in the ED, including medication administration, procedural interventions, or disposition planning, reflect this level of risk. COPA: 5 The patient has the following acute or chronic illness/injury that poses a possible threat to life or bodily function: [X] : The patient has a potentially serious acute condition or an acute exacerbation of a chronic illness requiring urgent evaluation and management in the Emergency Department. The clinical presentation necessitates immediate consideration of life-threatening or function-threatening diagnoses, even if they are ultimately ruled out.   FINAL CLINICAL IMPRESSION(S) / ED DIAGNOSES   Final diagnoses:  Respiratory distress  Sepsis, due to unspecified organism, unspecified whether acute organ dysfunction present (HCC)  Elevated d-dimer  Acute on chronic congestive heart failure, unspecified heart failure type (HCC)  Prolonged Q-T interval on ECG     Rx / DC Orders   ED Discharge Orders     None        Note:  This document was prepared using Dragon voice recognition software and may include unintentional dictation errors.    Nicholaus Rolland BRAVO, MD 07/22/24 [phone]  "

## 2024-07-23 DIAGNOSIS — J189 Pneumonia, unspecified organism: Secondary | ICD-10-CM | POA: Diagnosis not present

## 2024-07-23 LAB — CREATININE, SERUM
Creatinine, Ser: 0.85 mg/dL (ref 0.44–1.00)
GFR, Estimated: >60 mL/min

## 2024-07-23 LAB — LEGIONELLA PNEUMOPHILA SEROGP 1 UR AG: L. pneumophila Serogp 1 Ur Ag: NEGATIVE

## 2024-07-23 LAB — STREP PNEUMONIAE URINARY ANTIGEN: Strep Pneumo Urinary Antigen: NEGATIVE

## 2024-07-23 LAB — HIV ANTIBODY (ROUTINE TESTING W REFLEX): HIV Screen 4th Generation wRfx: NONREACTIVE

## 2024-07-23 MED ORDER — DOXYCYCLINE HYCLATE 100 MG PO TABS
100.0000 mg | ORAL_TABLET | Freq: Two times a day (BID) | ORAL | Status: DC
  Administered 2024-07-23 – 2024-07-25 (×5): 100 mg via ORAL
  Filled 2024-07-23 (×5): qty 1

## 2024-07-23 MED ORDER — VANCOMYCIN HCL 1250 MG/250ML IV SOLN
1250.0000 mg | INTRAVENOUS | Status: DC
  Administered 2024-07-23 – 2024-07-24 (×2): 1250 mg via INTRAVENOUS
  Filled 2024-07-23 (×4): qty 250

## 2024-07-23 MED ORDER — ENSURE PLUS HIGH PROTEIN PO LIQD
237.0000 mL | Freq: Three times a day (TID) | ORAL | Status: DC
  Administered 2024-07-23 – 2024-07-26 (×9): 237 mL via ORAL

## 2024-07-23 MED ORDER — METOPROLOL SUCCINATE ER 25 MG PO TB24
12.5000 mg | ORAL_TABLET | Freq: Every day | ORAL | Status: DC
  Administered 2024-07-23 – 2024-07-26 (×4): 12.5 mg via ORAL
  Filled 2024-07-23 (×4): qty 1

## 2024-07-23 MED ORDER — SODIUM CHLORIDE 0.9 % IV SOLN
2.0000 g | Freq: Three times a day (TID) | INTRAVENOUS | Status: DC
  Administered 2024-07-23 – 2024-07-25 (×5): 2 g via INTRAVENOUS
  Filled 2024-07-23 (×7): qty 12.5

## 2024-07-23 NOTE — ED Notes (Signed)
 Pt ate half of her spaghetti, finished all of her pineapple and had two cups of water for lunch.

## 2024-07-23 NOTE — Progress Notes (Signed)
 " PROGRESS NOTE    Victoria Medina   FMW:[id number] DOB: September 09, 1960  DOA: 07/22/2024 Date of Service: 07/23/2024 which is hospital day 1  PCP: [name], [facility] course / significant events:   HPI:  Victoria Medina is a pleasant 63 y.o. 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  Our service is known to this patient due to frequent admission.  She stated that she has been coughing for the last 1 week and was prescribed cough medications.  She did not get better rather she got worse with her shortness of breath to the point that she was not able to breathe this morning. Patient was discharged to nursing home after being admitted to hospital between 12/8 to 12/13 for severe hyponatremia and congestive heart failure. Per EMS patient was hypoxic to 60%, oxygen level did not improve and placed her on CPAP.   12/23: to ED. achycardic at 107, tachypneic at 39 hypoxic at 80% on room air placed on BiPAP. EKG showed sinus rhythm at 93 bpm, no ST elevation, chest x-ray showed pleural effusion. Patient had a leukocytosis at 16.8 lactate was 2.8, sodium was 134, potassium 3.4, creatinine was 1.13, ABG showed pH of 7.31, pCO2 of 53, pO2 was 58. Patient was given cefepime  and vancomycin , 40 mg of Lasix  IV, Solu-Medrol  40 mg IV, replace potassium 10 mEq. Admitted to hospitalist w/ PNA, hypoxic resp fail. No PE. 12/24: O2 requirement improved through today, still needing 2L       Consultants:  none  Procedures/Surgeries: none      ASSESSMENT & PLAN:   Sepsis present on admission, severe, w/ hypoxic respiratory failure d/t  healthcare-associated pneumonia Multifocal bilateral lung infiltrates on CT chest - clinically significant  (+)MRSA screen  Supplemental O2 to wean as able  received cefepime  and vancomycin  in the emergency room --> continued on doxycycline , cefepime  and vancomycin . Follow-up cultures.   Chronic systolic and diastolic congestive heart failure.  Essential HTN CAD, Hx STEMI Patient received 1 dose of IV Lasix  in the emergency room. continue Lasix  40 mg twice daily - of note, per SNF records she was getting only once a day Lasix . HOLD amlodipine  2.5 mg daily, losartan  100 mg daily - given low BP here Continue home spironolactone  12.5 mg daily  Reduce home metoprolol  succinate to 12.5 mg daily  Continue statin, ranolazine , isosorbide , aspirin   Hx CVA w/ residual L side deficits  Statin, ASA  Hx PE Not on [facility] outpatient, will not start now, high fall risk  Hypokalemia Replace as needed Monitor BMP   Hypothyroidism levothyroxine    Anxiety/depression Patient takes diazepam  10 mg every 6 hours as needed Continue olanzapine  home dose   GERD PPI    Class 1 obesity based on BMI: Body mass index is 33.78 kg/m.SABRA Significantly low or high BMI is associated with higher medical risk.  Underweight - under 18  overweight - 25 to 29 obese - 30 or more Class 1 obesity: BMI of 30.0 to 34 Class 2 obesity: BMI of 35.0 to 39 Class 3 obesity: BMI of 40.0 to 49 Super Morbid Obesity: BMI 50-59 Super-super Morbid Obesity: BMI 60+ Healthy nutrition and physical activity advised as adjunct to other disease management and risk reduction  treatments    DVT prophylaxis: lovenox  IV fluids: no continuous IV fluids  Nutrition: cardiac diet  Central lines / other devices: none  Code Status: DNR confirmed w/ patient at bedside today ACP documentation reviewed: DNR form is on file in [facility] and was confirmed with patient as above  TOC needs: TBD Medical barriers to dispo: IV abx, O2 requirement.  Expected medical readiness for discharge few dys.         Subjective / Brief ROS:  Patient reports feeling very tired but is happy to be breathing a lot better today  Denies CP/SOB.  Pain controlled.  Denies new weakness.  Tolerating diet.  Reports no concerns w/ urination/defecation.   Family Communication: call to sister Marta 07/23/2024 5:16 PM     Objective Findings:  Vitals:   07/23/24 1215 07/23/24 1231 07/23/24 1315 07/23/24 1614  BP:    102/68  Pulse: 88  86 88  Resp: (!) 23  15 20   Temp:  97.7 F (36.5 C)  97.8 F (36.6 C)  TempSrc:  Oral    SpO2: 100%  98% 97%  Weight:      Height:        Intake/Output Summary (Last 24 hours) at 07/23/2024 1713 Last data filed at 07/23/2024 1034 Gross per 24 hour  Intake 347.99 ml  Output 950 ml  Net -602.01 ml   Filed Weights   07/22/24 0355  Weight: 81.1 kg    Examination:  Physical Exam Constitutional:      General: She is not in acute distress. Cardiovascular:     Rate and Rhythm: Normal rate and regular rhythm.  Pulmonary:     Breath sounds: Wheezing present. No rales.  Musculoskeletal:     Right lower leg: No edema.     Left lower leg: No edema.  Skin:    General: Skin is warm and dry.  Neurological:     Mental Status: She is alert and oriented to person, place, and time.  Psychiatric:        Mood and Affect: Mood normal.        Behavior: Behavior normal.          Scheduled Medications:   aspirin  EC  81 mg Oral Daily   doxycycline   100 mg Oral Q12H   enoxaparin  (LOVENOX ) injection  0.5 mg/kg Subcutaneous Q24H   feeding supplement  237 mL Oral TID   furosemide   40 mg Oral BID   isosorbide  mononitrate  60 mg Oral Daily   levothyroxine   25 mcg Oral QAC breakfast   metoprolol  succinate  12.5 mg Oral Daily   OLANZapine   5 mg Oral Daily   pantoprazole   40 mg Oral BID AC   polyethylene glycol  17 g Oral Daily   ranolazine   500 mg Oral BID   rosuvastatin   5 mg Oral Daily    senna-docusate  2 tablet Oral QHS   spironolactone   12.5 mg Oral Daily    Continuous Infusions:  ceFEPime  (MAXIPIME ) IV     vancomycin  1,250 mg (07/23/24 1625)    PRN Medications:  diazepam , guaiFENesin   Antimicrobials from admission:  Anti-infectives (From admission, onward)    Start     Dose/Rate Route Frequency Ordered Stop   07/23/24 1800  ceFEPIme  (MAXIPIME ) 2 g in sodium chloride  0.9 % 100 mL IVPB        2 g 200 mL/hr over 30 Minutes Intravenous Every 8 hours 07/23/24 1027 07/27/24 0159   07/23/24 1300  vancomycin  (VANCOCIN )  IVPB 1000 mg/200 mL premix  Status:  Discontinued        1,000 mg 200 mL/hr over 60 Minutes Intravenous Every 24 hours 07/22/24 1152 07/23/24 1029   07/23/24 1200  vancomycin  (VANCOREADY) IVPB 1250 mg/250 mL        1,250 mg 166.7 mL/hr over 90 Minutes Intravenous Every 24 hours 07/23/24 1029     07/23/24 1100  doxycycline  (VIBRA -TABS) tablet 100 mg        100 mg Oral Every 12 hours 07/23/24 1035 07/27/24 0959   07/22/24 1400  doxycycline  (VIBRAMYCIN ) 100 mg in sodium chloride  0.9 % 250 mL IVPB  Status:  Discontinued        100 mg 125 mL/hr over 120 Minutes Intravenous Every 12 hours 07/22/24 1247 07/23/24 1035   07/22/24 1300  vancomycin  (VANCOREADY) IVPB 750 mg/150 mL        750 mg 150 mL/hr over 60 Minutes Intravenous  Once 07/22/24 1152 07/22/24 1410   07/22/24 1145  ceFEPIme  (MAXIPIME ) 2 g in sodium chloride  0.9 % 100 mL IVPB  Status:  Discontinued        2 g 200 mL/hr over 30 Minutes Intravenous Every 12 hours 07/22/24 1135 07/23/24 1027   07/22/24 1145  cefTRIAXone  (ROCEPHIN ) 2 g in sodium chloride  0.9 % 100 mL IVPB  Status:  Discontinued        2 g 200 mL/hr over 30 Minutes Intravenous Every 24 hours 07/22/24 1137 07/22/24 1139   07/22/24 0400  vancomycin  (VANCOCIN ) IVPB 1000 mg/200 mL premix        1,000 mg 200 mL/hr over 60 Minutes Intravenous  Once 07/22/24 0358 07/22/24 0819   07/22/24 0400  ceFEPIme  (MAXIPIME ) 2 g in sodium chloride  0.9  % 100 mL IVPB        2 g 200 mL/hr over 30 Minutes Intravenous  Once 07/22/24 0358 07/22/24 0640           Data Reviewed:  I have personally reviewed the following...  CBC: Recent Labs  Lab 07/22/24 0358 07/22/24 1726  WBC 16.8* 10.7*  NEUTROABS 14.3*  --   HGB 14.2 11.6*  HCT 45.3 36.3  MCV 85.5 84.8  PLT 551* 369   Basic Metabolic Panel: Recent Labs  Lab 07/22/24 0358 07/22/24 0530 07/22/24 1726 07/23/24 0626  NA 134*  --   --   --   K 3.4*  --   --   --   CL 92*  --   --   --   CO2 24  --   --   --   GLUCOSE 292*  --   --   --   BUN 9  --   --   --   CREATININE 1.13*  --  0.91 0.85  CALCIUM  9.4  --   --   --   MG  --  2.0  --   --    GFR: Estimated Creatinine Clearance: 65.3 mL/min (by C-G formula based on SCr of 0.85 mg/dL). Liver Function Tests: Recent Labs  Lab 07/22/24 0358  AST 18  ALT 9  ALKPHOS 101  BILITOT 0.6  PROT 7.7  ALBUMIN 4.3   Recent Labs  Lab 07/22/24 0358  LIPASE 33   No results for input(s): AMMONIA in the last 168 hours. Coagulation Profile: Recent Labs  Lab 07/22/24 0358  INR 1.0   Cardiac Enzymes: No results for input(s): CKTOTAL, CKMB, CKMBINDEX, TROPONINI in the last 168 hours. BNP (last 3 results) Recent  Labs    06/12/24 0300 07/07/24 0340 07/22/24 0358  PROBNP 1,956.0* 4,983.0* 9,313.0*   HbA1C: No results for input(s): HGBA1C in the last 72 hours. CBG: No results for input(s): GLUCAP in the last 168 hours. Lipid Profile: No results for input(s): CHOL, HDL, LDLCALC, TRIG, CHOLHDL, LDLDIRECT in the last 72 hours. Thyroid Function Tests: No results for input(s): TSH, T4TOTAL, FREET4, T3FREE, THYROIDAB in the last 72 hours. Anemia Panel: No results for input(s): VITAMINB12, FOLATE, FERRITIN, TIBC, IRON, RETICCTPCT in the last 72 hours. Most Recent Urinalysis On File:     Component Value Date/Time   COLORURINE YELLOW (A) 07/22/2024 1400   APPEARANCEUR  CLEAR (A) 07/22/2024 1400   APPEARANCEUR Hazy (A) 09/05/2022 1519   LABSPEC 1.024 07/22/2024 1400   LABSPEC 1.026 08/05/2014 1906   PHURINE 7.0 07/22/2024 1400   GLUCOSEU NEGATIVE 07/22/2024 1400   GLUCOSEU NEGATIVE 08/05/2014 1906   HGBUR MODERATE (A) 07/22/2024 1400   BILIRUBINUR NEGATIVE 07/22/2024 1400   BILIRUBINUR Negative 09/05/2022 1519   BILIRUBINUR NEGATIVE 08/05/2014 1906   KETONESUR NEGATIVE 07/22/2024 1400   PROTEINUR NEGATIVE 07/22/2024 1400   NITRITE POSITIVE (A) 07/22/2024 1400   LEUKOCYTESUR LARGE (A) 07/22/2024 1400   LEUKOCYTESUR 2+ 08/05/2014 1906   Sepsis Labs: @LABRCNTIP (procalcitonin:4,lacticidven:4) Microbiology: Recent Results (from the past 240 hours)  Blood Culture (routine x 2)     Status: None (Preliminary result)   Collection Time: 07/22/24  3:58 AM   Specimen: BLOOD RIGHT ARM  Result Value Ref Range Status   Specimen Description   Final    BLOOD RIGHT ARM Performed at [facility], [address], [place], [place] [address]    Special Requests   Final    BOTTLES DRAWN AEROBIC AND ANAEROBIC Blood Culture adequate volume   Culture  Setup Time PENDING  Incomplete   Culture   Final    NO GROWTH 1 DAY Performed at [facility], [address] [place], [place] [address]    Report Status PENDING  Incomplete  Resp panel by RT-PCR (RSV, Flu A&B, Covid) Anterior Nasal Swab     Status: None   Collection Time: 07/22/24  5:00 AM   Specimen: Anterior Nasal Swab  Result Value Ref Range Status   SARS Coronavirus 2 by RT PCR NEGATIVE NEGATIVE Final    Comment: (NOTE) SARS-CoV-2 target nucleic acids are NOT DETECTED.  The SARS-CoV-2 RNA is generally detectable in upper respiratory specimens during the acute phase of infection. The lowest concentration of SARS-CoV-2 viral copies this assay can detect is 138 copies/mL. A negative result does not preclude SARS-Cov-2 infection and should not be used as the sole basis for treatment or other  patient management decisions. A negative result may occur with  improper specimen collection/handling, submission of specimen other than nasopharyngeal swab, presence of viral mutation(s) within the areas targeted by this assay, and inadequate number of viral copies(<138 copies/mL). A negative result must be combined with clinical observations, patient history, and epidemiological information. The expected result is Negative.  Fact Sheet for Patients:  bloggercourse.com  Fact Sheet for Healthcare Providers:  seriousbroker.it  This test is no t yet approved or cleared by the [place]  FDA and  has been authorized for detection and/or diagnosis of SARS-CoV-2 by FDA under an Emergency Use Authorization (EUA). This EUA will remain  in effect (meaning this test can be used) for the duration of the COVID-19 declaration under Section 564(b)(1) of the Act, 21 U.S.C.section 360bbb-3(b)(1), unless the authorization is terminated  or revoked sooner.       Influenza A by PCR NEGATIVE NEGATIVE Final   Influenza B by PCR NEGATIVE NEGATIVE Final    Comment: (NOTE) The Xpert Xpress SARS-CoV-2/FLU/RSV plus assay is intended as an aid in the diagnosis of influenza from Nasopharyngeal swab specimens and should not be used as a sole basis for treatment. Nasal washings and aspirates are unacceptable for Xpert Xpress SARS-CoV-2/FLU/RSV testing.  Fact Sheet for Patients: bloggercourse.com  Fact Sheet for Healthcare Providers: seriousbroker.it  This test is not yet approved or cleared by the [place]  FDA and has been authorized for detection and/or diagnosis of SARS-CoV-2 by FDA under an Emergency Use Authorization (EUA). This EUA will remain in effect (meaning this test can be used) for the duration of the COVID-19 declaration under Section 564(b)(1) of the Act, 21 U.S.C. section  360bbb-3(b)(1), unless the authorization is terminated or revoked.     Resp Syncytial Virus by PCR NEGATIVE NEGATIVE Final    Comment: (NOTE) Fact Sheet for Patients: bloggercourse.com  Fact Sheet for Healthcare Providers: seriousbroker.it  This test is not yet approved or cleared by the [place]  FDA and has been authorized for detection and/or diagnosis of SARS-CoV-2 by FDA under an Emergency Use Authorization (EUA). This EUA will remain in effect (meaning this test can be used) for the duration of the COVID-19 declaration under Section 564(b)(1) of the Act, 21 U.S.C. section 360bbb-3(b)(1), unless the authorization is terminated or revoked.  Performed at [facility], [address] [place], [place] [address]   MRSA Next Gen by PCR, Nasal     Status: Abnormal   Collection Time: 07/22/24  5:26 PM   Specimen: Nasal Mucosa; Nasal Swab  Result Value Ref Range Status   MRSA by PCR Next Gen DETECTED (A) NOT DETECTED Final    Comment: RESULT CALLED TO, READ BACK BY AND VERIFIED WITH: ZOIE MANGAROO RN @2239  07/22/24 ASW (NOTE) The GeneXpert MRSA Assay (FDA approved for NASAL specimens only), is one component of a comprehensive MRSA colonization surveillance program. It is not intended to diagnose MRSA infection nor to guide or monitor treatment for MRSA infections. Test performance is not FDA approved in patients less than 85 years old. Performed at [facility], [address] [place], [place] [address]   Blood Culture (routine x 2)     Status: None (Preliminary result)   Collection Time: 07/22/24  9:00 PM   Specimen: Right Antecubital; Blood  Result Value Ref Range Status   Specimen Description RIGHT ANTECUBITAL  Final   Special Requests   Final    BOTTLES DRAWN AEROBIC AND ANAEROBIC Blood Culture adequate volume   Culture   Final    NO GROWTH < 24 HOURS Performed at [facility], [address] [place], [place] [address]    Report Status PENDING  Incomplete      Radiology Studies last 3 days: CT Angio Chest PE W and/or Wo Contrast Result Date: 07/22/2024 CLINICAL DATA:  Shortness of breath. EXAM: CT ANGIOGRAPHY CHEST WITH CONTRAST TECHNIQUE: Multidetector CT imaging of the chest was performed using the standard protocol during bolus administration of intravenous contrast. Multiplanar CT image reconstructions and MIPs were obtained to evaluate the vascular anatomy. RADIATION DOSE REDUCTION: This exam was performed according to the departmental dose-optimization program which includes automated exposure control, adjustment of the mA and/or kV according to patient size and/or use of iterative reconstruction technique. CONTRAST:  75mL OMNIPAQUE  IOHEXOL  350 MG/ML SOLN COMPARISON:  January 24, 2020 FINDINGS: Cardiovascular: Satisfactory opacification of the pulmonary arteries to the segmental level. No evidence of pulmonary embolism. There is mild cardiomegaly with marked severity coronary artery calcification no pericardial effusion. Mediastinum/Nodes: No enlarged mediastinal, hilar, or axillary lymph nodes. Thyroid gland, trachea, and esophagus demonstrate no significant findings. Lungs/Pleura: Moderate to marked severity multifocal infiltrates are seen throughout both lungs. Mild compressive atelectasis is also noted within the posterior aspect of the bilateral lower lobes. There are small bilateral pleural effusions. No pneumothorax is identified. Upper Abdomen: Ill-defined gallstones are seen within the gallbladder lumen. Musculoskeletal: Postoperative changes are seen within the lower cervical spine. No acute osseous abnormalities are identified. Review of the MIP images confirms the above findings. IMPRESSION: 1. No evidence of pulmonary embolism. 2. Moderate to marked severity multifocal bilateral infiltrates. 3. Small bilateral pleural effusions. 4. Cholelithiasis. Electronically  Signed   By: Suzen Dials M.D.   On: 07/22/2024 11:57   DG Chest Port 1 View Result Date: 07/22/2024 EXAM: 1 VIEW(S) XRAY OF THE CHEST 07/22/2024 04:21:00 AM COMPARISON: Portable chest, 07/07/2024. CLINICAL HISTORY: Questionable sepsis - evaluate for abnormality. FINDINGS: LINES, TUBES AND DEVICES: Monitor wires are present. LUNGS AND PLEURA: Increased central vascular congestion and moderate diffuse interstitial edema with a basal gradient. Increased perihilar haziness which is probably ground glass edema. Small pleural effusions are forming. No pneumothorax. No other focal airspace process is seen. HEART AND MEDIASTINUM: Mild cardiomegaly. Calcification in the transverse aorta. Calcifications and stents in the LAD coronary artery. Unremarkable mediastinum. BONES AND SOFT TISSUES: Artifact from overlying clothing. No acute osseous abnormality. Partially visible lower cervical spine fusion plating. IMPRESSION: 1. Increased central vascular congestion and moderate diffuse interstitial edema with basal gradient, with probable ground-glass edema centrally. 2. Small pleural effusions. 3. Mild cardiomegaly. Electronically signed by: Francis Quam MD 07/22/2024 04:55 AM EST RP Workstation: [id number]          Laneta Blunt, DO [facility] 07/23/2024, 5:13 PM    Dictation software may have been used to generate the above note. Typos may occur and escape review in typed/dictated notes. Please contact Dr Blunt directly for clarity if needed.  Staff may message me via secure chat in Epic  but this may not receive an immediate response,  please page me for urgent matters!  If 7PM-7AM, please contact night coverage [email or web address]       "

## 2024-07-23 NOTE — Progress Notes (Signed)
 Pharmacy Antibiotic Note  Victoria Medina is a 63 y.o. female admitted on 07/22/2024 with pneumonia. PMH significant for HFrEF, CAD, stroke, bilateral blindness, anxiety, depression, hypothyroid, morbid obesity, history of DVT, PE no longer on anticoagulation, left upper extremity contractures, frequent hospitalization, chronic suprapubic catheter. Patient was recently admitted from 12/8-12/13 for hyponatremia and CHF. Now presenting with SOB and concern for PNA. Pharmacy has been consulted for Vancomycin  dosing.   Plan: Pt received vancomycin  1750 mg x 1 total loading dose and started on vancomycin  1000 mg q24H. Will adjust vancomycin  dose to 1250 mg q24H due to improvement in Scr. Predicted AUC of 457. Goal AUC of 400-550. Scr 0.85, Vd 0.72. IBW. Plan to order vancomycin  level after 4th or 5th dose.   Adjusted cefepime  dose from q12H to q8H.   Continue doxycyline 100 mg BID, will change to PO. To limit fluid with hx of HF.    Height: 5' 1 (154.9 cm) Weight: 81.1 kg (178 lb 12.7 oz) IBW/kg (Calculated) : 47.8  Temp (24hrs), Avg:97.8 F (36.6 C), Min:97.1 F (36.2 C), Max:98.3 F (36.8 C)  Recent Labs  Lab 07/22/24 0358 07/22/24 0715 07/22/24 1726 07/23/24 0626  WBC 16.8*  --  10.7*  --   CREATININE 1.13*  --  0.91 0.85  LATICACIDVEN 2.8* 1.5  --   --     Estimated Creatinine Clearance: 65.3 mL/min (by C-G formula based on SCr of 0.85 mg/dL).    Allergies[1]  Antimicrobials this admission: Cefepime  12/23 >> Vancomycin  12/23 >>   Dose adjustments this admission:   Microbiology results: 12/23 BCx: NGTD 12/23 MRSA PCR: positive.   Thank you for allowing pharmacy to be a part of this patients care.   Cathaleen Blanch, PharmD, BCPS  07/23/2024 10:30 AM       [1]  Allergies Allergen Reactions   Levaquin [Levofloxacin In D5w] Anaphylaxis and Swelling   Iodinated Contrast Media Itching    Severe itching   Other     Dust mites and opiates (all of them)   Latex  Dermatitis

## 2024-07-23 NOTE — Hospital Course (Addendum)
 Hospital course / significant events:   HPI:  Victoria Medina is a pleasant 63 y.o. 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  Our service is known to this patient due to frequent admission.  She stated that she has been coughing for the last 1 week and was prescribed cough medications.  She did not get better rather she got worse with her shortness of breath to the point that she was not able to breathe this morning. Patient was discharged to nursing home after being admitted to hospital between 12/8 to 12/13 for severe hyponatremia and congestive heart failure. Per EMS patient was hypoxic to 60%, oxygen level did not improve and placed her on CPAP.   12/23: to ED. achycardic at 107, tachypneic at 39 hypoxic at 80% on room air placed on BiPAP. EKG showed sinus rhythm at 93 bpm, no ST elevation, chest x-ray showed pleural effusion. Patient had a leukocytosis at 16.8 lactate was 2.8, sodium was 134, potassium 3.4, creatinine was 1.13, ABG showed pH of 7.31, pCO2 of 53, pO2 was 58. Patient was given cefepime  and vancomycin , 40 mg of Lasix  IV, Solu-Medrol  40 mg IV, replace potassium 10 mEq. Admitted to hospitalist w/ PNA, hypoxic resp fail. No PE. 12/24: O2 requirement improved through today, still needing 2L   12/25: SOB off O2 today, remain on 2L. Blood cultures no growth x2 days  12/26: saturating okay off O2 but pt reports SOB without it and would like it back in place. CXR repeat shows interval improvement.  12/27: pt feeling much better today, still some cough, off O2 and doing well on room air, pt feels comfortable for discharge today        Consultants:  none  Procedures/Surgeries: none      ASSESSMENT & PLAN:   Sepsis present on admission, severe, w/ hypoxic respiratory failure d/t healthcare-associated pneumonia Multifocal bilateral lung infiltrates on CT chest - clinically significant  (+)MRSA screen  Supplemental O2 to wean as able or may need to rx on discharge  received cefepime  and vancomycin  in the emergency room --> continued on doxycycline , cefepime  and vancomycin . Follow-up cultures.   Chronic systolic and diastolic congestive heart failure.  Essential HTN CAD, Hx STEMI Patient received 1 dose of IV Lasix  in the emergency room. continue Lasix  40 mg twice daily - of note, per SNF records she was getting only once a day Lasix . HOLD amlodipine  2.5 mg daily, losartan  100 mg daily - given low BP here Continue home spironolactone  12.5 mg daily  Reduce home metoprolol  succinate to 12.5 mg daily  Continue statin, ranolazine , isosorbide , aspirin   Hypokalemia Replace as needed Monitor BMP  Blindness L arm contracture Please leave items in easy reach of R hand  Hx CVA w/ residual L side deficits  Statin, ASA  Hx PE Not on [facility] outpatient  Hypokalemia Replace as needed Monitor BMP   Hypothyroidism levothyroxine    Anxiety/depression Conintue diazepam  10 mg every 6 hours as needed Continue olanzapine    GERD PPI  Chronic Foley Bacterial colonization Exchange Foley routinely   Class 1 obesity based on BMI: Body mass index is 33.78 kg/m.SABRA Significantly low or high BMI is associated with higher medical risk.  Underweight - under 18  overweight - 25  to 29 obese - 30 or more Class 1 obesity: BMI of 30.0 to 34 Class 2 obesity: BMI of 35.0 to 39 Class 3 obesity: BMI of 40.0 to 49 Super Morbid Obesity: BMI 50-59 Super-super Morbid Obesity: BMI 60+ Healthy nutrition and physical activity advised as adjunct to other disease management and risk reduction treatments    DVT  prophylaxis: lovenox  IV fluids: no continuous IV fluids  Nutrition: cardiac diet  Central lines / other devices: none  Code Status: DNR confirmed w/ patient at bedside today ACP documentation reviewed: DNR form is on file in [facility] and was confirmed with patient as above  TOC needs: TBD Medical barriers to dispo: IV abx, O2 requirement. Expected medical readiness for discharge 1-2 days

## 2024-07-23 NOTE — ED Notes (Addendum)
 IV team consult placed after pt was stating her current IV was hurting during flushing with normal saline and no blood return was noted. This same comment was placed under IV team order for IV team to see. This RN and a paramedic attempted an IV and were unsuccessful before IV team order was placed.

## 2024-07-23 NOTE — Plan of Care (Signed)
  Problem: Education: Goal: Knowledge of General Education information will improve Description: Including pain rating scale, medication(s)/side effects and non-pharmacologic comfort measures Outcome: Progressing   Problem: Nutrition: Goal: Adequate nutrition will be maintained Outcome: Progressing   Problem: Coping: Goal: Level of anxiety will decrease Outcome: Progressing   Problem: Elimination: Goal: Will not experience complications related to bowel motility Outcome: Progressing   Problem: Pain Managment: Goal: General experience of comfort will improve and/or be controlled Outcome: Progressing   Problem: Safety: Goal: Ability to remain free from injury will improve Outcome: Progressing

## 2024-07-23 NOTE — ED Notes (Signed)
 Pt cleaned up after an episode of bowel incontinence and clean brief applied

## 2024-07-23 NOTE — ED Notes (Signed)
 Lab at bedside

## 2024-07-24 DIAGNOSIS — J189 Pneumonia, unspecified organism: Secondary | ICD-10-CM | POA: Diagnosis not present

## 2024-07-24 NOTE — NC FL2 (Signed)
 " [place]  MEDICAID FL2 LEVEL OF CARE FORM     IDENTIFICATION  Patient Name: Victoria Medina Birthdate: [birth year] Sex: female Admission Date (Current Location): 07/22/2024  [facility] and [place] Number:  Chiropodist and Address:  [facility], [address], [place], [place] [address]      Provider Number: [id number]  Attending Physician Name and Address:  Marsa Edelman, DO  Relative Name and Phone Number:  Beckett Hickmon- sister- [phone]    Current Level of Care: Hospital Recommended Level of Care: Skilled Nursing Facility Prior Approval Number:    Date Approved/Denied:   PASRR Number: [id number]  Discharge Plan: SNF    Current Diagnoses: Patient Active Problem List   Diagnosis Date Noted   Acute hypoxic respiratory failure (HCC) 07/22/2024   Pneumonia 07/22/2024   Delusional disorder (HCC) 06/17/2024   Encounter for medication titration 06/17/2024   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) 09/10/2023   DNR (do not resuscitate) 09/13/2022   Chronic systolic CHF (congestive heart failure) (HCC) - LVEF 20-25% 06/09/2022   Major depressive disorder, recurrent, moderate (HCC) 06/09/2022   Suprapubic catheter (HCC) 10/09/2021   Coronary artery disease involving native coronary artery of native heart with unstable angina pectoris (HCC) 10/09/2021   Microcytic anemia 10/09/2021   T2DM (type 2 diabetes mellitus) (HCC)    GERD (gastroesophageal reflux disease) 12/18/2014   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   Essential hypertension 07/11/2013   Acquired hypothyroidism 06/10/2013   Impingement syndrome of right shoulder 07/07/2012   Obesity, Class I, BMI 30-34.9 06/04/2012   GAD (generalized anxiety disorder) 02/07/2011   Mixed hyperlipidemia 02/07/2011   Obstructive sleep apnea (adult) (pediatric) 02/07/2011    Orientation RESPIRATION BLADDER Height & Weight     Self, Situation,  Place  O2  (Suprapubic catheter) Weight: 81.1 kg Height:  5' 1 (154.9 cm)  BEHAVIORAL SYMPTOMS/MOOD NEUROLOGICAL BOWEL NUTRITION STATUS      Continent Diet (Heart Healthy no added salt)  AMBULATORY STATUS COMMUNICATION OF NEEDS Skin   Total Care Verbally Normal                       Personal Care Assistance Level of Assistance  Bathing, Feeding, Dressing Bathing Assistance: Maximum assistance Feeding assistance: Limited assistance Dressing Assistance: Maximum assistance     Functional Limitations Info  Sight, Hearing Sight Info: Impaired (Blind) Hearing Info: Impaired      SPECIAL CARE FACTORS FREQUENCY                       Contractures Contractures Info: Present    Additional Factors Info  Code Status, Allergies Code Status Info: DNR Allergies Info: Levaquin (levofloxacin In D5w) High  Anaphylaxis, Swelling   Iodinated Contrast Media Medium  Itching Severe itching  Other Not Specified   Dust mites and opiates (all of them)  Latex Low  Dermatitis           Current Medications (07/24/2024):  This is the current hospital active medication list Current Facility-Administered Medications  Medication Dose Route Frequency Provider Last Rate Last Admin   aspirin  EC tablet 81 mg  81 mg Oral Daily Paudel, Keshab, MD   81 mg at 07/24/24 0929   ceFEPIme  (MAXIPIME ) 2 g in sodium chloride  0.9 % 100 mL IVPB  2 g Intravenous Q8H Alexander, Natalie, DO 200 mL/hr at 07/24/24 0927 2 g at 07/24/24 0927   diazepam  (VALIUM ) tablet 10 mg  10 mg Oral Q6H PRN Paudel, Keshab, MD       doxycycline  (VIBRA -TABS) tablet 100 mg  100 mg Oral Q12H Alexander, Natalie, DO   100 mg at 07/24/24 0930   feeding supplement (ENSURE PLUS HIGH PROTEIN) liquid 237 mL  237 mL Oral TID Marsa Edelman, DO   237 mL at 07/24/24 0931   furosemide  (LASIX ) tablet 40 mg  40 mg Oral BID Paudel, Keshab, MD   40 mg at 07/24/24 0930   guaiFENesin  (ROBITUSSIN) 100 MG/5ML liquid 5 mL  5 mL Oral Q4H PRN Paudel,  Keshab, MD       isosorbide  mononitrate (IMDUR ) 24 hr tablet 60 mg  60 mg Oral Daily Paudel, Keshab, MD   60 mg at 07/24/24 0930   levothyroxine  (SYNTHROID ) tablet 25 mcg  25 mcg Oral QAC breakfast Paudel, Keshab, MD   25 mcg at 07/24/24 9365   metoprolol  succinate (TOPROL -XL) 24 hr tablet 12.5 mg  12.5 mg Oral Daily Alexander, Natalie, DO   12.5 mg at 07/24/24 0930   OLANZapine  (ZYPREXA ) tablet 5 mg  5 mg Oral Daily Paudel, Nena, MD   5 mg at 07/24/24 0931   pantoprazole  (PROTONIX ) EC tablet 40 mg  40 mg Oral BID AC Paudel, Nena, MD   40 mg at 07/24/24 0929   polyethylene glycol (MIRALAX  / GLYCOLAX ) packet 17 g  17 g Oral Daily Paudel, Nena, MD   17 g at 07/23/24 1008   ranolazine  (RANEXA ) 12 hr tablet 500 mg  500 mg Oral BID Paudel, Keshab, MD   500 mg at 07/24/24 9068   rosuvastatin  (CRESTOR ) tablet 5 mg  5 mg Oral Daily Paudel, Nena, MD   5 mg at 07/24/24 9070   senna-docusate (Senokot-S) tablet 2 tablet  2 tablet Oral QHS Paudel, Keshab, MD   2 tablet at 07/23/24 2206   spironolactone  (ALDACTONE ) tablet 12.5 mg  12.5 mg Oral Daily Paudel, Keshab, MD   12.5 mg at 07/24/24 0930   vancomycin  (VANCOREADY) IVPB 1250 mg/250 mL  1,250 mg Intravenous Q24H Alexander, Natalie, DO 166.7 mL/hr at 07/24/24 0431 Infusion Verify at 07/24/24 0431     Discharge Medications: Please see discharge summary for a list of discharge medications.  Relevant Imaging Results:  Relevant Lab Results:   Additional Information SSN:[id number]  Nathanael CHRISTELLA Ring, RN     "

## 2024-07-24 NOTE — Plan of Care (Signed)
?  Problem: Health Behavior/Discharge Planning: ?Goal: Ability to manage health-related needs will improve ?Outcome: Progressing ?  ?Problem: Clinical Measurements: ?Goal: Will remain free from infection ?Outcome: Progressing ?Goal: Respiratory complications will improve ?Outcome: Progressing ?  ?

## 2024-07-24 NOTE — Progress Notes (Signed)
Per Dr Sheppard Coil, dc tele monitoring

## 2024-07-24 NOTE — TOC Initial Note (Signed)
 Transition of Care [facility]) - Initial/Assessment Note    Patient Details  Name: Victoria Medina MRN: [id number] Date of Birth: February 11, 1961  [facility]) CM/SW Contact:    Victoria CHRISTELLA Ring, RN Phone Number: 07/24/2024, 10:06 AM  Clinical Narrative:                 Patient is from [facility].  Patient will return to [facility] [place].   Expected Discharge Plan: Skilled Nursing Facility Barriers to Discharge: Continued Medical Work up   Patient Goals and CMS Choice            Expected Discharge Plan and Services   Discharge Planning Services: CM Consult   Living arrangements for the past 2 months: Skilled Nursing Facility                 DME Arranged: N/A         HH Arranged: NA          Prior Living Arrangements/Services Living arrangements for the past 2 months: Skilled Nursing Facility Lives with:: Facility Resident Patient language and need for interpreter reviewed:: Yes Do you feel safe going back to the place where you live?: Yes      Need for Family Participation in Patient Care: Yes (Comment) Care giver support system in place?: Yes (comment)   Criminal Activity/Legal Involvement Pertinent to Current Situation/Hospitalization: No - Comment as needed  Activities of Daily Living   ADL Screening (condition at time of admission) Independently performs ADLs?: No Does the patient have a NEW difficulty with bathing/dressing/toileting/self-feeding that is expected to last >3 days?: No Does the patient have a NEW difficulty with getting in/out of bed, walking, or climbing stairs that is expected to last >3 days?: No Does the patient have a NEW difficulty with communication that is expected to last >3 days?: No Is the patient deaf or have difficulty hearing?: No Does the patient have difficulty seeing, even when wearing glasses/contacts?: No Does the patient have difficulty concentrating, remembering, or making decisions?: No  Permission  Sought/Granted Permission sought to share information with : Facility Medical Sales Representative    Share Information with NAME: Loria Lacina  Permission granted to share info w AGENCY: [facility]  Permission granted to share info w Relationship: sister  Permission granted to share info w Contact Information: [phone]  Emotional Assessment Appearance:: Appears stated age       Alcohol / Substance Use: Not Applicable Psych Involvement: No (comment)  Admission diagnosis:  Pneumonia [J18.9] Respiratory distress [R06.03] Prolonged Q-T interval on ECG [R94.31] Elevated d-dimer [R79.89] Acute on chronic congestive heart failure, unspecified heart failure type (HCC) [I50.9] Sepsis, due to unspecified organism, unspecified whether acute organ dysfunction present (HCC) [A41.9] Acute hypoxic respiratory failure (HCC) [J96.01] Patient Active Problem List   Diagnosis Date Noted   Acute hypoxic respiratory failure (HCC) 07/22/2024   Pneumonia 07/22/2024   Delusional disorder (HCC) 06/17/2024   Encounter for medication titration 06/17/2024   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) 09/10/2023   DNR (do not resuscitate) 09/13/2022   Chronic systolic CHF (congestive heart failure) (HCC) - LVEF 20-25% 06/09/2022   Major depressive disorder, recurrent, moderate (HCC) 06/09/2022   Suprapubic catheter (HCC) 10/09/2021   Coronary artery disease involving native coronary artery of native heart with unstable angina pectoris (HCC) 10/09/2021   Microcytic anemia 10/09/2021   T2DM (type 2 diabetes mellitus) (HCC)    GERD (gastroesophageal reflux disease) 12/18/2014  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   Essential hypertension 07/11/2013   Acquired hypothyroidism 06/10/2013   Impingement syndrome of right shoulder 07/07/2012   Obesity, Class I, BMI 30-34.9 06/04/2012   GAD (generalized anxiety disorder) 02/07/2011   Mixed hyperlipidemia 02/07/2011    Obstructive sleep apnea (adult) (pediatric) 02/07/2011   PCP:  [name], DO Pharmacy:   [facility] [place] [place] - [address] [place] [address] Phone: [phone] Fax: [phone]     Social Drivers of Health (SDOH) Social History: SDOH Screenings   Food Insecurity: No Food Insecurity (07/23/2024)  Housing: Low Risk (07/23/2024)  Transportation Needs: No Transportation Needs (07/23/2024)  Utilities: Not At Risk (07/23/2024)  Depression (PHQ2-9): Low Risk (06/10/2024)  Social Connections: Socially Isolated (07/09/2024)  Tobacco Use: Low Risk (07/22/2024)   SDOH Interventions: Food Insecurity Interventions: Intervention Not Indicated Housing Interventions: Intervention Not Indicated Utilities Interventions: Intervention Not Indicated   Readmission Risk Interventions     No data to display

## 2024-07-24 NOTE — Plan of Care (Signed)
  Problem: Education: Goal: Knowledge of General Education information will improve Description: Including pain rating scale, medication(s)/side effects and non-pharmacologic comfort measures Outcome: Progressing   Problem: Nutrition: Goal: Adequate nutrition will be maintained Outcome: Progressing   Problem: Elimination: Goal: Will not experience complications related to bowel motility Outcome: Progressing   Problem: Pain Managment: Goal: General experience of comfort will improve and/or be controlled Outcome: Progressing   Problem: Safety: Goal: Ability to remain free from injury will improve Outcome: Progressing   Problem: Skin Integrity: Goal: Risk for impaired skin integrity will decrease Outcome: Progressing

## 2024-07-24 NOTE — Progress Notes (Signed)
 " PROGRESS NOTE    Victoria Medina   FMW:[id number] DOB: 25-May-1961  DOA: 07/22/2024 Date of Service: 07/24/2024 which is hospital day 2  PCP: [name], [facility] course / significant events:   HPI:  Victoria Medina is a pleasant 63 y.o. 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  Our service is known to this patient due to frequent admission.  She stated that she has been coughing for the last 1 week and was prescribed cough medications.  She did not get better rather she got worse with her shortness of breath to the point that she was not able to breathe this morning. Patient was discharged to nursing home after being admitted to hospital between 12/8 to 12/13 for severe hyponatremia and congestive heart failure. Per EMS patient was hypoxic to 60%, oxygen level did not improve and placed her on CPAP.   12/23: to ED. achycardic at 107, tachypneic at 39 hypoxic at 80% on room air placed on BiPAP. EKG showed sinus rhythm at 93 bpm, no ST elevation, chest x-ray showed pleural effusion. Patient had a leukocytosis at 16.8 lactate was 2.8, sodium was 134, potassium 3.4, creatinine was 1.13, ABG showed pH of 7.31, pCO2 of 53, pO2 was 58. Patient was given cefepime  and vancomycin , 40 mg of Lasix  IV, Solu-Medrol  40 mg IV, replace potassium 10 mEq. Admitted to hospitalist w/ PNA, hypoxic resp fail. No PE. 12/24: O2 requirement improved through today, still needing 2L   12/25: SOB off O2 today, remain on 2L. Blood cultures no growth x2 days      Consultants:  none  Procedures/Surgeries: none      ASSESSMENT & PLAN:   Sepsis present on  admission, severe, w/ hypoxic respiratory failure d/t healthcare-associated pneumonia Multifocal bilateral lung infiltrates on CT chest - clinically significant  (+)MRSA screen  Supplemental O2 to wean as able or may need to rx on discharge  received cefepime  and vancomycin  in the emergency room --> continued on doxycycline , cefepime  and vancomycin . Follow-up cultures.   Chronic systolic and diastolic congestive heart failure.  Essential HTN CAD, Hx STEMI Patient received 1 dose of IV Lasix  in the emergency room. continue Lasix  40 mg twice daily - of note, per SNF records she was getting only once a day Lasix . HOLD amlodipine  2.5 mg daily, losartan  100 mg daily - given low BP here Continue home spironolactone  12.5 mg daily  Reduce home metoprolol  succinate to 12.5 mg daily  Continue statin, ranolazine , isosorbide , aspirin   Blindness L arm contracture Please leave items in easy reach of R hand  Hx CVA w/ residual L side deficits  Statin, ASA  Hx PE Not on [facility] outpatient  Hypokalemia Replace as needed Monitor BMP   Hypothyroidism levothyroxine    Anxiety/depression Conintue diazepam  10 mg every 6 hours as needed Continue olanzapine    GERD PPI    Class 1 obesity based on BMI: Body mass index is 33.78 kg/m.SABRA Significantly low or high BMI is associated with higher medical risk.  Underweight - under 18  overweight - 25 to 29 obese - 30 or more Class 1 obesity: BMI of 30.0 to 34 Class 2 obesity: BMI of 35.0 to 39 Class 3 obesity: BMI of 40.0 to  49 Super Morbid Obesity: BMI 50-59 Super-super Morbid Obesity: BMI 60+ Healthy nutrition and physical activity advised as adjunct to other disease management and risk reduction treatments    DVT prophylaxis: lovenox  IV fluids: no continuous IV fluids  Nutrition: cardiac diet  Central lines / other devices: none  Code Status: DNR confirmed w/ patient at bedside today ACP documentation reviewed: DNR form is on file in [facility]  and was confirmed with patient as above  TOC needs: TBD Medical barriers to dispo: IV abx, O2 requirement. Expected medical readiness for discharge 1-2 days          Subjective / Brief ROS:  Patient reports breathing about the same today Off O2 notes SOB at rest Denies CP/SOB at rest on O2 Pain controlled.  Denies new weakness.  Tolerating diet.  Reports no concerns w/ urination/defecation.   Family Communication: call to sister Marta 07/24/2024 2:35 PM     Objective Findings:  Vitals:   07/23/24 2015 07/23/24 2312 07/24/24 0422 07/24/24 0758  BP: 96/62 103/69 101/65 108/69  Pulse: 83 80 79 80  Resp: 18 14 17 18   Temp: 97.8 F (36.6 C) 98 F (36.7 C) 98.5 F (36.9 C) 98.2 F (36.8 C)  TempSrc: Oral Oral Oral   SpO2: 95% 96% 96% 95%  Weight:      Height:        Intake/Output Summary (Last 24 hours) at 07/24/2024 1435 Last data filed at 07/24/2024 0433 Gross per 24 hour  Intake 450 ml  Output 1800 ml  Net -1350 ml   Filed Weights   07/22/24 0355  Weight: 81.1 kg    Examination:  Physical Exam Constitutional:      General: She is not in acute distress. Cardiovascular:     Rate and Rhythm: Normal rate and regular rhythm.  Pulmonary:     Breath sounds: Wheezing present. No rales.  Musculoskeletal:     Right lower leg: No edema.     Left lower leg: No edema.  Skin:    General: Skin is warm and dry.  Neurological:     Mental Status: She is alert and oriented to person, place, and time.  Psychiatric:        Mood and Affect: Mood normal.        Behavior: Behavior normal.          Scheduled Medications:   aspirin  EC  81 mg Oral Daily   doxycycline   100 mg Oral Q12H   feeding supplement  237 mL Oral TID   furosemide   40 mg Oral BID   isosorbide  mononitrate  60 mg Oral Daily   levothyroxine   25 mcg Oral QAC breakfast   metoprolol  succinate  12.5 mg Oral Daily   OLANZapine   5 mg Oral Daily   pantoprazole   40 mg Oral BID AC   polyethylene  glycol  17 g Oral Daily   ranolazine   500 mg Oral BID   rosuvastatin   5 mg Oral Daily   senna-docusate  2 tablet Oral QHS   spironolactone   12.5 mg Oral Daily    Continuous Infusions:  ceFEPime  (MAXIPIME ) IV 2 g (07/24/24 0927)   vancomycin  166.7 mL/hr at 07/24/24 0431    PRN Medications:  diazepam , guaiFENesin   Antimicrobials from admission:  Anti-infectives (From admission, onward)    Start     Dose/Rate Route Frequency Ordered Stop   07/23/24 1800  ceFEPIme  (MAXIPIME ) 2 g in sodium chloride  0.9 % 100 mL IVPB  2 g 200 mL/hr over 30 Minutes Intravenous Every 8 hours 07/23/24 1027 07/27/24 0159   07/23/24 1300  vancomycin  (VANCOCIN ) IVPB 1000 mg/200 mL premix  Status:  Discontinued        1,000 mg 200 mL/hr over 60 Minutes Intravenous Every 24 hours 07/22/24 1152 07/23/24 1029   07/23/24 1200  vancomycin  (VANCOREADY) IVPB 1250 mg/250 mL        1,250 mg 166.7 mL/hr over 90 Minutes Intravenous Every 24 hours 07/23/24 1029     07/23/24 1100  doxycycline  (VIBRA -TABS) tablet 100 mg        100 mg Oral Every 12 hours 07/23/24 1035 07/27/24 0959   07/22/24 1400  doxycycline  (VIBRAMYCIN ) 100 mg in sodium chloride  0.9 % 250 mL IVPB  Status:  Discontinued        100 mg 125 mL/hr over 120 Minutes Intravenous Every 12 hours 07/22/24 1247 07/23/24 1035   07/22/24 1300  vancomycin  (VANCOREADY) IVPB 750 mg/150 mL        750 mg 150 mL/hr over 60 Minutes Intravenous  Once 07/22/24 1152 07/22/24 1410   07/22/24 1145  ceFEPIme  (MAXIPIME ) 2 g in sodium chloride  0.9 % 100 mL IVPB  Status:  Discontinued        2 g 200 mL/hr over 30 Minutes Intravenous Every 12 hours 07/22/24 1135 07/23/24 1027   07/22/24 1145  cefTRIAXone  (ROCEPHIN ) 2 g in sodium chloride  0.9 % 100 mL IVPB  Status:  Discontinued        2 g 200 mL/hr over 30 Minutes Intravenous Every 24 hours 07/22/24 1137 07/22/24 1139   07/22/24 0400  vancomycin  (VANCOCIN ) IVPB 1000 mg/200 mL premix        1,000 mg 200 mL/hr over 60  Minutes Intravenous  Once 07/22/24 0358 07/22/24 0819   07/22/24 0400  ceFEPIme  (MAXIPIME ) 2 g in sodium chloride  0.9 % 100 mL IVPB        2 g 200 mL/hr over 30 Minutes Intravenous  Once 07/22/24 0358 07/22/24 0640           Data Reviewed:  I have personally reviewed the following...  CBC: Recent Labs  Lab 07/22/24 0358 07/22/24 1726  WBC 16.8* 10.7*  NEUTROABS 14.3*  --   HGB 14.2 11.6*  HCT 45.3 36.3  MCV 85.5 84.8  PLT 551* 369   Basic Metabolic Panel: Recent Labs  Lab 07/22/24 0358 07/22/24 0530 07/22/24 1726 07/23/24 0626  NA 134*  --   --   --   K 3.4*  --   --   --   CL 92*  --   --   --   CO2 24  --   --   --   GLUCOSE 292*  --   --   --   BUN 9  --   --   --   CREATININE 1.13*  --  0.91 0.85  CALCIUM  9.4  --   --   --   MG  --  2.0  --   --    GFR: Estimated Creatinine Clearance: 65.3 mL/min (by C-G formula based on SCr of 0.85 mg/dL). Liver Function Tests: Recent Labs  Lab 07/22/24 0358  AST 18  ALT 9  ALKPHOS 101  BILITOT 0.6  PROT 7.7  ALBUMIN 4.3   Recent Labs  Lab 07/22/24 0358  LIPASE 33   No results for input(s): AMMONIA in the last 168 hours. Coagulation Profile: Recent Labs  Lab 07/22/24 0358  INR 1.0  Cardiac Enzymes: No results for input(s): CKTOTAL, CKMB, CKMBINDEX, TROPONINI in the last 168 hours. BNP (last 3 results) Recent Labs    06/12/24 0300 07/07/24 0340 07/22/24 0358  PROBNP 1,956.0* 4,983.0* 9,313.0*   HbA1C: No results for input(s): HGBA1C in the last 72 hours. CBG: No results for input(s): GLUCAP in the last 168 hours. Lipid Profile: No results for input(s): CHOL, HDL, LDLCALC, TRIG, CHOLHDL, LDLDIRECT in the last 72 hours. Thyroid Function Tests: No results for input(s): TSH, T4TOTAL, FREET4, T3FREE, THYROIDAB in the last 72 hours. Anemia Panel: No results for input(s): VITAMINB12, FOLATE, FERRITIN, TIBC, IRON, RETICCTPCT in the last 72 hours. Most  Recent Urinalysis On File:     Component Value Date/Time   COLORURINE YELLOW (A) 07/22/2024 1400   APPEARANCEUR CLEAR (A) 07/22/2024 1400   APPEARANCEUR Hazy (A) 09/05/2022 1519   LABSPEC 1.024 07/22/2024 1400   LABSPEC 1.026 08/05/2014 1906   PHURINE 7.0 07/22/2024 1400   GLUCOSEU NEGATIVE 07/22/2024 1400   GLUCOSEU NEGATIVE 08/05/2014 1906   HGBUR MODERATE (A) 07/22/2024 1400   BILIRUBINUR NEGATIVE 07/22/2024 1400   BILIRUBINUR Negative 09/05/2022 1519   BILIRUBINUR NEGATIVE 08/05/2014 1906   KETONESUR NEGATIVE 07/22/2024 1400   PROTEINUR NEGATIVE 07/22/2024 1400   NITRITE POSITIVE (A) 07/22/2024 1400   LEUKOCYTESUR LARGE (A) 07/22/2024 1400   LEUKOCYTESUR 2+ 08/05/2014 1906   Sepsis Labs: @LABRCNTIP (procalcitonin:4,lacticidven:4) Microbiology: Recent Results (from the past 240 hours)  Blood Culture (routine x 2)     Status: None (Preliminary result)   Collection Time: 07/22/24  3:58 AM   Specimen: BLOOD RIGHT ARM  Result Value Ref Range Status   Specimen Description   Final    BLOOD RIGHT ARM Performed at [facility], [address], [place], [place] [address]    Special Requests   Final    BOTTLES DRAWN AEROBIC AND ANAEROBIC Blood Culture adequate volume   Culture  Setup Time PENDING  Incomplete   Culture   Final    NO GROWTH 2 DAYS Performed at [facility], [address], [place], [place] [address]    Report Status PENDING  Incomplete  Resp panel by RT-PCR (RSV, Flu A&B, Covid) Anterior Nasal Swab     Status: None   Collection Time: 07/22/24  5:00 AM   Specimen: Anterior Nasal Swab  Result Value Ref Range Status   SARS Coronavirus 2 by RT PCR NEGATIVE NEGATIVE Final    Comment: (NOTE) SARS-CoV-2 target nucleic acids are NOT DETECTED.  The SARS-CoV-2 RNA is generally detectable in upper respiratory specimens during the acute phase of infection. The lowest concentration of SARS-CoV-2 viral copies this assay can detect is 138 copies/mL. A  negative result does not preclude SARS-Cov-2 infection and should not be used as the sole basis for treatment or other patient management decisions. A negative result may occur with  improper specimen collection/handling, submission of specimen other than nasopharyngeal swab, presence of viral mutation(s) within the areas targeted by this assay, and inadequate number of viral copies(<138 copies/mL). A negative result must be combined with clinical observations, patient history, and epidemiological information. The expected result is Negative.  Fact Sheet for Patients:  bloggercourse.com  Fact Sheet for Healthcare Providers:  seriousbroker.it  This test is no t yet approved or cleared by the [place]  FDA and  has been authorized for detection and/or diagnosis of SARS-CoV-2 by FDA under an Emergency Use Authorization (EUA). This EUA will remain  in effect (meaning this test can be used) for  the duration of the COVID-19 declaration under Section 564(b)(1) of the Act, 21 U.S.C.section 360bbb-3(b)(1), unless the authorization is terminated  or revoked sooner.       Influenza A by PCR NEGATIVE NEGATIVE Final   Influenza B by PCR NEGATIVE NEGATIVE Final    Comment: (NOTE) The Xpert Xpress SARS-CoV-2/FLU/RSV plus assay is intended as an aid in the diagnosis of influenza from Nasopharyngeal swab specimens and should not be used as a sole basis for treatment. Nasal washings and aspirates are unacceptable for Xpert Xpress SARS-CoV-2/FLU/RSV testing.  Fact Sheet for Patients: bloggercourse.com  Fact Sheet for Healthcare Providers: seriousbroker.it  This test is not yet approved or cleared by the [place]  FDA and has been authorized for detection and/or diagnosis of SARS-CoV-2 by FDA under an Emergency Use Authorization (EUA). This EUA will remain in effect (meaning this test can  be used) for the duration of the COVID-19 declaration under Section 564(b)(1) of the Act, 21 U.S.C. section 360bbb-3(b)(1), unless the authorization is terminated or revoked.     Resp Syncytial Virus by PCR NEGATIVE NEGATIVE Final    Comment: (NOTE) Fact Sheet for Patients: bloggercourse.com  Fact Sheet for Healthcare Providers: seriousbroker.it  This test is not yet approved or cleared by the [place]  FDA and has been authorized for detection and/or diagnosis of SARS-CoV-2 by FDA under an Emergency Use Authorization (EUA). This EUA will remain in effect (meaning this test can be used) for the duration of the COVID-19 declaration under Section 564(b)(1) of the Act, 21 U.S.C. section 360bbb-3(b)(1), unless the authorization is terminated or revoked.  Performed at [facility], [address] [place], [place] [address]   Urine Culture     Status: Abnormal (Preliminary result)   Collection Time: 07/22/24  2:00 PM   Specimen: Urine, Random  Result Value Ref Range Status   Specimen Description   Final    URINE, RANDOM Performed at [facility], [address] [place], [place] [address]    Special Requests   Final    NONE Reflexed from [id number] Performed at [facility], [address] [place], [place] [address]    Culture (A)  Final    20,000 COLONIES/mL PSEUDOMONAS AERUGINOSA >=100,000 COLONIES/mL ENTEROCOCCUS FAECALIS SUSCEPTIBILITIES TO FOLLOW Performed at [facility], [address], [place], [place] [address]    Report Status PENDING  Incomplete  MRSA Next Gen by PCR, Nasal     Status: Abnormal   Collection Time: 07/22/24  5:26 PM   Specimen: Nasal Mucosa; Nasal Swab  Result Value Ref Range Status   MRSA by PCR Next Gen DETECTED (A) NOT DETECTED Final    Comment: RESULT CALLED TO, READ BACK BY AND VERIFIED WITH: ZOIE MANGAROO RN @2239  07/22/24 ASW (NOTE) The GeneXpert MRSA  Assay (FDA approved for NASAL specimens only), is one component of a comprehensive MRSA colonization surveillance program. It is not intended to diagnose MRSA infection nor to guide or monitor treatment for MRSA infections. Test performance is not FDA approved in patients less than 60 years old. Performed at [facility], [address] [place], [place] [address]   Blood Culture (routine x 2)     Status: None (Preliminary result)   Collection Time: 07/22/24  9:00 PM   Specimen: Right Antecubital; Blood  Result Value Ref Range Status   Specimen Description RIGHT ANTECUBITAL  Final   Special Requests   Final    BOTTLES DRAWN AEROBIC AND ANAEROBIC Blood Culture adequate volume  Culture   Final    NO GROWTH 2 DAYS Performed at [facility], [address] [place], [place] [address]    Report Status PENDING  Incomplete      Radiology Studies last 3 days: CT Angio Chest PE W and/or Wo Contrast Result Date: 07/22/2024 CLINICAL DATA:  Shortness of breath. EXAM: CT ANGIOGRAPHY CHEST WITH CONTRAST TECHNIQUE: Multidetector CT imaging of the chest was performed using the standard protocol during bolus administration of intravenous contrast. Multiplanar CT image reconstructions and MIPs were obtained to evaluate the vascular anatomy. RADIATION DOSE REDUCTION: This exam was performed according to the departmental dose-optimization program which includes automated exposure control, adjustment of the mA and/or kV according to patient size and/or use of iterative reconstruction technique. CONTRAST:  75mL OMNIPAQUE  IOHEXOL  350 MG/ML SOLN COMPARISON:  January 24, 2020 FINDINGS: Cardiovascular: Satisfactory opacification of the pulmonary arteries to the segmental level. No evidence of pulmonary embolism. There is mild cardiomegaly with marked severity coronary artery calcification no pericardial effusion. Mediastinum/Nodes: No enlarged mediastinal, hilar, or axillary lymph nodes. Thyroid  gland, trachea, and esophagus demonstrate no significant findings. Lungs/Pleura: Moderate to marked severity multifocal infiltrates are seen throughout both lungs. Mild compressive atelectasis is also noted within the posterior aspect of the bilateral lower lobes. There are small bilateral pleural effusions. No pneumothorax is identified. Upper Abdomen: Ill-defined gallstones are seen within the gallbladder lumen. Musculoskeletal: Postoperative changes are seen within the lower cervical spine. No acute osseous abnormalities are identified. Review of the MIP images confirms the above findings. IMPRESSION: 1. No evidence of pulmonary embolism. 2. Moderate to marked severity multifocal bilateral infiltrates. 3. Small bilateral pleural effusions. 4. Cholelithiasis. Electronically Signed   By: Suzen Dials M.D.   On: 07/22/2024 11:57   DG Chest Port 1 View Result Date: 07/22/2024 EXAM: 1 VIEW(S) XRAY OF THE CHEST 07/22/2024 04:21:00 AM COMPARISON: Portable chest, 07/07/2024. CLINICAL HISTORY: Questionable sepsis - evaluate for abnormality. FINDINGS: LINES, TUBES AND DEVICES: Monitor wires are present. LUNGS AND PLEURA: Increased central vascular congestion and moderate diffuse interstitial edema with a basal gradient. Increased perihilar haziness which is probably ground glass edema. Small pleural effusions are forming. No pneumothorax. No other focal airspace process is seen. HEART AND MEDIASTINUM: Mild cardiomegaly. Calcification in the transverse aorta. Calcifications and stents in the LAD coronary artery. Unremarkable mediastinum. BONES AND SOFT TISSUES: Artifact from overlying clothing. No acute osseous abnormality. Partially visible lower cervical spine fusion plating. IMPRESSION: 1. Increased central vascular congestion and moderate diffuse interstitial edema with basal gradient, with probable ground-glass edema centrally. 2. Small pleural effusions. 3. Mild cardiomegaly. Electronically signed by: Francis Quam MD 07/22/2024 04:55 AM EST RP Workstation: [id number]          Laneta Blunt, DO [facility] 07/24/2024, 2:35 PM    Dictation software may have been used to generate the above note. Typos may occur and escape review in typed/dictated notes. Please contact Dr Blunt directly for clarity if needed.  Staff may message me via secure chat in Epic  but this may not receive an immediate response,  please page me for urgent matters!  If 7PM-7AM, please contact night coverage [email or web address]       "

## 2024-07-25 ENCOUNTER — Inpatient Hospital Stay

## 2024-07-25 DIAGNOSIS — J189 Pneumonia, unspecified organism: Secondary | ICD-10-CM | POA: Diagnosis not present

## 2024-07-25 LAB — CBC
HCT: 36.8 % (ref 36.0–46.0)
Hemoglobin: 11.8 g/dL — ABNORMAL LOW (ref 12.0–15.0)
MCH: 27 pg (ref 26.0–34.0)
MCHC: 32.1 g/dL (ref 30.0–36.0)
MCV: 84.2 fL (ref 80.0–100.0)
Platelets: 291 K/uL (ref 150–400)
RBC: 4.37 MIL/uL (ref 3.87–5.11)
RDW: 15 % (ref 11.5–15.5)
WBC: 6 K/uL (ref 4.0–10.5)
nRBC: 0 % (ref 0.0–0.2)

## 2024-07-25 LAB — URINE CULTURE: Culture: 20000 — AB

## 2024-07-25 LAB — BASIC METABOLIC PANEL WITH GFR
Anion gap: 13 (ref 5–15)
BUN: 20 mg/dL (ref 8–23)
CO2: 25 mmol/L (ref 22–32)
Calcium: 9 mg/dL (ref 8.9–10.3)
Chloride: 96 mmol/L — ABNORMAL LOW (ref 98–111)
Creatinine, Ser: 0.93 mg/dL (ref 0.44–1.00)
GFR, Estimated: >60 mL/min
Glucose, Bld: 181 mg/dL — ABNORMAL HIGH (ref 70–99)
Potassium: 3.2 mmol/L — ABNORMAL LOW (ref 3.5–5.1)
Sodium: 134 mmol/L — ABNORMAL LOW (ref 135–145)

## 2024-07-25 LAB — CREATININE, SERUM
Creatinine, Ser: 0.92 mg/dL (ref 0.44–1.00)
GFR, Estimated: >60 mL/min

## 2024-07-25 MED ORDER — AMOXICILLIN-POT CLAVULANATE 875-125 MG PO TABS
1.0000 | ORAL_TABLET | Freq: Two times a day (BID) | ORAL | Status: DC
  Administered 2024-07-25 – 2024-07-26 (×2): 1 via ORAL
  Filled 2024-07-25 (×2): qty 1

## 2024-07-25 MED ORDER — HYDROCOD POLI-CHLORPHE POLI ER 10-8 MG/5ML PO SUER
5.0000 mL | Freq: Two times a day (BID) | ORAL | Status: DC | PRN
  Administered 2024-07-25: 5 mL via ORAL
  Filled 2024-07-25: qty 5

## 2024-07-25 MED ORDER — IPRATROPIUM-ALBUTEROL 0.5-2.5 (3) MG/3ML IN SOLN
3.0000 mL | RESPIRATORY_TRACT | Status: DC | PRN
  Administered 2024-07-25: 3 mL via RESPIRATORY_TRACT
  Filled 2024-07-25: qty 3

## 2024-07-25 MED ORDER — ROSUVASTATIN CALCIUM 20 MG PO TABS
20.0000 mg | ORAL_TABLET | Freq: Every day | ORAL | Status: DC
  Administered 2024-07-26: 20 mg via ORAL
  Filled 2024-07-25: qty 1

## 2024-07-25 MED ORDER — GUAIFENESIN ER 600 MG PO TB12
600.0000 mg | ORAL_TABLET | Freq: Two times a day (BID) | ORAL | Status: DC
  Administered 2024-07-25 – 2024-07-26 (×4): 600 mg via ORAL
  Filled 2024-07-25 (×4): qty 1

## 2024-07-25 MED ORDER — MUPIROCIN 2 % EX OINT
1.0000 | TOPICAL_OINTMENT | Freq: Two times a day (BID) | CUTANEOUS | Status: DC
  Administered 2024-07-25 – 2024-07-26 (×2): 1 via NASAL
  Filled 2024-07-25: qty 22

## 2024-07-25 MED ORDER — DOXYCYCLINE HYCLATE 100 MG PO TABS
100.0000 mg | ORAL_TABLET | Freq: Two times a day (BID) | ORAL | Status: DC
  Administered 2024-07-25 – 2024-07-26 (×2): 100 mg via ORAL
  Filled 2024-07-25 (×2): qty 1

## 2024-07-25 NOTE — Care Management Important Message (Signed)
 Important Message  Patient Details  Name: Victoria Medina MRN: [id number] Date of Birth: 18-Dec-1960   Important Message Given:  Yes - Medicare IM     Satoya Feeley W, CMA 07/25/2024, 2:50 PM

## 2024-07-25 NOTE — Plan of Care (Signed)

## 2024-07-25 NOTE — Progress Notes (Addendum)
 " PROGRESS NOTE    Victoria Medina   FMW:[id number] DOB: 1961-01-30  DOA: 07/22/2024 Date of Service: 07/25/2024 which is hospital day 3  PCP: [name], [facility] course / significant events:   HPI:  Victoria Medina is a pleasant 63 y.o. 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  Our service is known to this patient due to frequent admission.  She stated that she has been coughing for the last 1 week and was prescribed cough medications.  She did not get better rather she got worse with her shortness of breath to the point that she was not able to breathe this morning. Patient was discharged to nursing home after being admitted to hospital between 12/8 to 12/13 for severe hyponatremia and congestive heart failure. Per EMS patient was hypoxic to 60%, oxygen level did not improve and placed her on CPAP.   12/23: to ED. achycardic at 107, tachypneic at 39 hypoxic at 80% on room air placed on BiPAP. EKG showed sinus rhythm at 93 bpm, no ST elevation, chest x-ray showed pleural effusion. Patient had a leukocytosis at 16.8 lactate was 2.8, sodium was 134, potassium 3.4, creatinine was 1.13, ABG showed pH of 7.31, pCO2 of 53, pO2 was 58. Patient was given cefepime  and vancomycin , 40 mg of Lasix  IV, Solu-Medrol  40 mg IV, replace potassium 10 mEq. Admitted to hospitalist w/ PNA, hypoxic resp fail. No PE. 12/24: O2 requirement improved through today, still needing 2L   12/25: SOB off O2 today, remain on 2L. Blood cultures no growth x2 days  12/26: saturating okay off O2 but pt reports SOB without it and would like it back in place. CXR repeat shows  interval improvement.      Consultants:  none  Procedures/Surgeries: none      ASSESSMENT & PLAN:   Sepsis present on admission, severe, w/ hypoxic respiratory failure d/t healthcare-associated pneumonia Multifocal bilateral lung infiltrates on CT chest - clinically significant  (+)MRSA screen  Supplemental O2 to wean as able or may need to rx on discharge  received cefepime  and vancomycin  in the emergency room --> continued on doxycycline , cefepime  and vancomycin . Follow-up cultures.   Chronic systolic and diastolic congestive heart failure.  Essential HTN CAD, Hx STEMI Patient received 1 dose of IV Lasix  in the emergency room. continue Lasix  40 mg twice daily - of note, per SNF records she was getting only once a day Lasix . HOLD amlodipine  2.5 mg daily, losartan  100 mg daily - given low BP here Continue home spironolactone  12.5 mg daily  Reduce home metoprolol  succinate to 12.5 mg daily  Continue statin, ranolazine , isosorbide , aspirin   Blindness L arm contracture Please leave items in easy reach of R hand  Hx CVA w/ residual L side deficits  Statin, ASA  Hx PE Not on [facility] outpatient  Hypokalemia Replace as needed Monitor BMP   Hypothyroidism levothyroxine    Anxiety/depression Conintue diazepam  10 mg every 6 hours as needed Continue olanzapine    GERD PPI  Chronic Foley Bacterial colonization Exchange Foley routinely   Class 1 obesity based on BMI: Body mass index is 33.78 kg/m.SABRA Significantly low or high BMI is associated with higher medical risk.  Underweight - under 18  overweight -  25 to 29 obese - 30 or more Class 1 obesity: BMI of 30.0 to 34 Class 2 obesity: BMI of 35.0 to 39 Class 3 obesity: BMI of 40.0 to 49 Super Morbid Obesity: BMI 50-59 Super-super Morbid Obesity: BMI 60+ Healthy nutrition and physical activity advised as adjunct to other disease management and risk reduction treatments    DVT prophylaxis: lovenox  IV fluids: no  continuous IV fluids  Nutrition: cardiac diet  Central lines / other devices: none  Code Status: DNR confirmed w/ patient at bedside today ACP documentation reviewed: DNR form [facility] on [facility] in [facility] and was confirmed with patient as above  TOC needs: TBD Medical barriers to dispo: IV abx, O2 requirement. Expected medical readiness for discharge 1-2 days          Subjective / Brief ROS:  Off O2 notes SOB at rest about same as yesterday  Denies CP/SOB at rest on O2 Pain controlled.  Denies new weakness.  Tolerating diet.  Reports no concerns w/ urination/defecation.   Family Communication: call to sister [name] yesterday    Objective Findings:  Vitals:   07/24/24 1541 07/24/24 1947 07/25/24 0501 07/25/24 0749  BP: 100/65 97/64 (!) 93/50 90/70  Pulse: 79 82 91 88  Resp: 18 20 20 16   Temp: 98.2 F (36.8 C) 98 F (36.7 C) (!) 97.5 F (36.4 C) 98.1 F (36.7 C)  TempSrc:  Oral Oral   SpO2: 97% 97% 97% 91%  Weight:      Height:        Intake/Output Summary (Last 24 hours) at 07/25/2024 1607 Last data filed at 07/25/2024 1353 Gross per 24 hour  Intake 1147 ml  Output 2925 ml  Net -1778 ml   Filed Weights   07/22/24 0355  Weight: 81.1 kg    Examination:  Physical Exam Constitutional:      General: She is not in acute distress. Cardiovascular:     Rate and Rhythm: Normal rate and regular rhythm.  Pulmonary:     Breath sounds: No wheezing or rales.  Musculoskeletal:     Right lower leg: No edema.     Left lower leg: No edema.  Skin:    General: Skin is warm and dry.  Neurological:     Mental Status: She is alert and oriented to person, place, and time.  Psychiatric:        Mood and Affect: Mood normal.        Behavior: Behavior normal.          Scheduled Medications:   amoxicillin -clavulanate  1 tablet Oral BID WC   aspirin  EC  81 mg Oral Daily   doxycycline   100 mg Oral BID WC   feeding supplement  237 mL Oral TID   furosemide   40 mg Oral  BID   guaiFENesin   600 mg Oral BID   isosorbide  mononitrate  60 mg Oral Daily   levothyroxine   25 mcg Oral QAC breakfast   metoprolol  succinate  12.5 mg Oral Daily   mupirocin  ointment  1 Application Nasal BID   OLANZapine   5 mg Oral Daily   pantoprazole   40 mg Oral BID AC   polyethylene glycol  17 g Oral Daily   ranolazine   500 mg Oral BID   [START ON 07/26/2024] rosuvastatin   20 mg Oral Daily   senna-docusate  2 tablet Oral QHS   spironolactone   12.5 mg Oral Daily    Continuous Infusions:    PRN Medications:  chlorpheniramine-HYDROcodone, diazepam , ipratropium-albuterol   Antimicrobials from admission:  Anti-infectives (From admission, onward)    Start     Dose/Rate Route Frequency Ordered Stop   07/25/24 1700  amoxicillin -clavulanate (AUGMENTIN ) 875-125 MG per tablet 1 tablet        1 tablet Oral 2 times daily with meals 07/25/24 1344 07/27/24 1659   07/25/24 1700  doxycycline  (VIBRA -TABS) tablet 100 mg        100 mg Oral 2 times daily with meals 07/25/24 1344 07/27/24 0759   07/23/24 1800  ceFEPIme  (MAXIPIME ) 2 g in sodium chloride  0.9 % 100 mL IVPB  Status:  Discontinued        2 g 200 mL/hr over 30 Minutes Intravenous Every 8 hours 07/23/24 1027 07/25/24 1345   07/23/24 1300  vancomycin  (VANCOCIN ) IVPB 1000 mg/200 mL premix  Status:  Discontinued        1,000 mg 200 mL/hr over 60 Minutes Intravenous Every 24 hours 07/22/24 1152 07/23/24 1029   07/23/24 1200  vancomycin  (VANCOREADY) IVPB 1250 mg/250 mL  Status:  Discontinued        1,250 mg 166.7 mL/hr over 90 Minutes Intravenous Every 24 hours 07/23/24 1029 07/25/24 1339   07/23/24 1100  doxycycline  (VIBRA -TABS) tablet 100 mg  Status:  Discontinued        100 mg Oral Every 12 hours 07/23/24 1035 07/25/24 1347   07/22/24 1400  doxycycline  (VIBRAMYCIN ) 100 mg in sodium chloride  0.9 % 250 mL IVPB  Status:  Discontinued        100 mg 125 mL/hr over 120 Minutes Intravenous Every 12 hours 07/22/24 1247 07/23/24 1035    07/22/24 1300  vancomycin  (VANCOREADY) IVPB 750 mg/150 mL        750 mg 150 mL/hr over 60 Minutes Intravenous  Once 07/22/24 1152 07/22/24 1410   07/22/24 1145  ceFEPIme  (MAXIPIME ) 2 g in sodium chloride  0.9 % 100 mL IVPB  Status:  Discontinued        2 g 200 mL/hr over 30 Minutes Intravenous Every 12 hours 07/22/24 1135 07/23/24 1027   07/22/24 1145  cefTRIAXone  (ROCEPHIN ) 2 g in sodium chloride  0.9 % 100 mL IVPB  Status:  Discontinued        2 g 200 mL/hr over 30 Minutes Intravenous Every 24 hours 07/22/24 1137 07/22/24 1139   07/22/24 0400  vancomycin  (VANCOCIN ) IVPB 1000 mg/200 mL premix        1,000 mg 200 mL/hr over 60 Minutes Intravenous  Once 07/22/24 0358 07/22/24 0819   07/22/24 0400  ceFEPIme  (MAXIPIME ) 2 g in sodium chloride  0.9 % 100 mL IVPB        2 g 200 mL/hr over 30 Minutes Intravenous  Once 07/22/24 0358 07/22/24 0640           Data Reviewed:  I have personally reviewed the following...  CBC: Recent Labs  Lab 07/22/24 0358 07/22/24 1726 07/25/24 1013  WBC 16.8* 10.7* 6.0  NEUTROABS 14.3*  --   --   HGB 14.2 11.6* 11.8*  HCT 45.3 36.3 36.8  MCV 85.5 84.8 84.2  PLT 551* 369 291   Basic Metabolic Panel: Recent Labs  Lab 07/22/24 0358 07/22/24 0530 07/22/24 1726 07/23/24 0626 07/25/24 0817 07/25/24 1013  NA 134*  --   --   --   --  134*  K 3.4*  --   --   --   --  3.2*  CL 92*  --   --   --   --  96*  CO2  24  --   --   --   --  25  GLUCOSE 292*  --   --   --   --  181*  BUN 9  --   --   --   --  20  CREATININE 1.13*  --  0.91 0.85 0.92 0.93  CALCIUM  9.4  --   --   --   --  9.0  MG  --  2.0  --   --   --   --    GFR: Estimated Creatinine Clearance: 59.7 mL/min (by C-G formula based on SCr of 0.93 mg/dL). Liver Function Tests: Recent Labs  Lab 07/22/24 0358  AST 18  ALT 9  ALKPHOS 101  BILITOT 0.6  PROT 7.7  ALBUMIN 4.3   Recent Labs  Lab 07/22/24 0358  LIPASE 33   No results for input(s): AMMONIA in the last 168  hours. Coagulation Profile: Recent Labs  Lab 07/22/24 0358  INR 1.0   Cardiac Enzymes: No results for input(s): CKTOTAL, CKMB, CKMBINDEX, TROPONINI in the last 168 hours. BNP (last 3 results) Recent Labs    06/12/24 0300 07/07/24 0340 07/22/24 0358  PROBNP 1,956.0* 4,983.0* 9,313.0*   HbA1C: No results for input(s): HGBA1C in the last 72 hours. CBG: No results for input(s): GLUCAP in the last 168 hours. Lipid Profile: No results for input(s): CHOL, HDL, LDLCALC, TRIG, CHOLHDL, LDLDIRECT in the last 72 hours. Thyroid Function Tests: No results for input(s): TSH, T4TOTAL, FREET4, T3FREE, THYROIDAB in the last 72 hours. Anemia Panel: No results for input(s): VITAMINB12, FOLATE, FERRITIN, TIBC, IRON, RETICCTPCT in the last 72 hours. Most Recent Urinalysis On File:     Component Value Date/Time   COLORURINE YELLOW (A) 07/22/2024 1400   APPEARANCEUR CLEAR (A) 07/22/2024 1400   APPEARANCEUR Hazy (A) 09/05/2022 1519   LABSPEC 1.024 07/22/2024 1400   LABSPEC 1.026 08/05/2014 1906   PHURINE 7.0 07/22/2024 1400   GLUCOSEU NEGATIVE 07/22/2024 1400   GLUCOSEU NEGATIVE 08/05/2014 1906   HGBUR MODERATE (A) 07/22/2024 1400   BILIRUBINUR NEGATIVE 07/22/2024 1400   BILIRUBINUR Negative 09/05/2022 1519   BILIRUBINUR NEGATIVE 08/05/2014 1906   KETONESUR NEGATIVE 07/22/2024 1400   PROTEINUR NEGATIVE 07/22/2024 1400   NITRITE POSITIVE (A) 07/22/2024 1400   LEUKOCYTESUR LARGE (A) 07/22/2024 1400   LEUKOCYTESUR 2+ 08/05/2014 1906   Sepsis Labs: @LABRCNTIP (procalcitonin:4,lacticidven:4) Microbiology: Recent Results (from the past 240 hours)  Blood Culture (routine x 2)     Status: None (Preliminary result)   Collection Time: 07/22/24  3:58 AM   Specimen: BLOOD RIGHT ARM  Result Value Ref Range Status   Specimen Description   Final    BLOOD RIGHT ARM Performed at [facility], [address], [place], [place] [address]    Special  Requests   Final    BOTTLES DRAWN AEROBIC AND ANAEROBIC Blood Culture adequate volume   Culture  Setup Time PENDING  Incomplete   Culture   Final    NO GROWTH 3 DAYS Performed at [facility], [address] [place], [place] [address]    Report Status PENDING  Incomplete  Resp panel by RT-PCR (RSV, Flu A&B, Covid) Anterior Nasal Swab     Status: None   Collection Time: 07/22/24  5:00 AM   Specimen: Anterior Nasal Swab  Result Value Ref Range Status   SARS Coronavirus 2 by RT PCR NEGATIVE NEGATIVE Final    Comment: (NOTE) SARS-CoV-2 target nucleic acids are NOT DETECTED.  The  SARS-CoV-2 RNA is generally detectable in upper respiratory specimens during the acute phase of infection. The lowest concentration of SARS-CoV-2 viral copies this assay can detect is 138 copies/mL. A negative result does not preclude SARS-Cov-2 infection and should not be used as the sole basis for treatment or other patient management decisions. A negative result may occur with  improper specimen collection/handling, submission of specimen other than nasopharyngeal swab, presence of viral mutation(s) within the areas targeted by this assay, and inadequate number of viral copies(<138 copies/mL). A negative result must be combined with clinical observations, patient history, and epidemiological information. The expected result is Negative.  Fact Sheet for Patients:  bloggercourse.com  Fact Sheet for Healthcare Providers:  seriousbroker.it  This test is no t yet approved or cleared by the [place]  FDA and  has been authorized for detection and/or diagnosis of SARS-CoV-2 by FDA under an Emergency Use Authorization (EUA). This EUA will remain  in effect (meaning this test can be used) for the duration of the COVID-19 declaration under Section 564(b)(1) of the Act, 21 U.S.C.section 360bbb-3(b)(1), unless the authorization is terminated  or  revoked sooner.       Influenza A by PCR NEGATIVE NEGATIVE Final   Influenza B by PCR NEGATIVE NEGATIVE Final    Comment: (NOTE) The Xpert Xpress SARS-CoV-2/FLU/RSV plus assay is intended as an aid in the diagnosis of influenza from Nasopharyngeal swab specimens and should not be used as a sole basis for treatment. Nasal washings and aspirates are unacceptable for Xpert Xpress SARS-CoV-2/FLU/RSV testing.  Fact Sheet for Patients: bloggercourse.com  Fact Sheet for Healthcare Providers: seriousbroker.it  This test is not yet approved or cleared by the [place]  FDA and has been authorized for detection and/or diagnosis of SARS-CoV-2 by FDA under an Emergency Use Authorization (EUA). This EUA will remain in effect (meaning this test can be used) for the duration of the COVID-19 declaration under Section 564(b)(1) of the Act, 21 U.S.C. section 360bbb-3(b)(1), unless the authorization is terminated or revoked.     Resp Syncytial Virus by PCR NEGATIVE NEGATIVE Final    Comment: (NOTE) Fact Sheet for Patients: bloggercourse.com  Fact Sheet for Healthcare Providers: seriousbroker.it  This test is not yet approved or cleared by the [place]  FDA and has been authorized for detection and/or diagnosis of SARS-CoV-2 by FDA under an Emergency Use Authorization (EUA). This EUA will remain in effect (meaning this test can be used) for the duration of the COVID-19 declaration under Section 564(b)(1) of the Act, 21 U.S.C. section 360bbb-3(b)(1), unless the authorization is terminated or revoked.  Performed at [facility], [address] [place], [place] [address]   Urine Culture     Status: Abnormal   Collection Time: 07/22/24  2:00 PM   Specimen: Urine, Random  Result Value Ref Range Status   Specimen Description   Final    URINE, RANDOM Performed at [facility], [address] [place], [place] [address]    Special Requests   Final    NONE Reflexed from [phone] Performed at [facility], [address] [place], [place] [address]    Culture (A)  Final    20,000 COLONIES/mL PSEUDOMONAS AERUGINOSA >=100,000 COLONIES/mL ENTEROCOCCUS FAECALIS    Report Status 07/25/2024 FINAL  Final   Organism ID, Bacteria PSEUDOMONAS AERUGINOSA (A)  Final   Organism ID, Bacteria ENTEROCOCCUS FAECALIS (A)  Final      Susceptibility   Enterococcus faecalis - MIC*    AMPICILLIN <=2  SENSITIVE Sensitive     NITROFURANTOIN  <=16 SENSITIVE Sensitive     VANCOMYCIN  1 SENSITIVE Sensitive     * >=100,000 COLONIES/mL ENTEROCOCCUS FAECALIS   Pseudomonas aeruginosa - MIC*    MEROPENEM 1 SENSITIVE Sensitive     CIPROFLOXACIN 0.12 SENSITIVE Sensitive     IMIPENEM 2 SENSITIVE Sensitive     PIP/TAZO Value in next row Sensitive      <=4 SENSITIVEThis is a modified FDA-approved test that has been validated and its performance characteristics determined by the reporting laboratory.  This laboratory is certified under the Clinical Laboratory Improvement Amendments CLIA as qualified to perform high complexity clinical laboratory testing.    CEFEPIME  Value in next row Sensitive      <=4 SENSITIVEThis is a modified FDA-approved test that has been validated and its performance characteristics determined by the reporting laboratory.  This laboratory is certified under the Clinical Laboratory Improvement Amendments CLIA as qualified to perform high complexity clinical laboratory testing.    CEFTAZIDIME/AVIBACTAM Value in next row Sensitive      <=4 SENSITIVEThis is a modified FDA-approved test that has been validated and its performance characteristics determined by the reporting laboratory.  This laboratory is certified under the Clinical Laboratory Improvement Amendments CLIA as qualified to perform high complexity clinical laboratory testing.     CEFTOLOZANE/TAZOBACTAM Value in next row Sensitive      <=4 SENSITIVEThis is a modified FDA-approved test that has been validated and its performance characteristics determined by the reporting laboratory.  This laboratory is certified under the Clinical Laboratory Improvement Amendments CLIA as qualified to perform high complexity clinical laboratory testing.    TOBRAMYCIN Value in next row Sensitive      <=4 SENSITIVEThis is a modified FDA-approved test that has been validated and its performance characteristics determined by the reporting laboratory.  This laboratory is certified under the Clinical Laboratory Improvement Amendments CLIA as qualified to perform high complexity clinical laboratory testing.    CEFTAZIDIME Value in next row Sensitive      <=4 SENSITIVEThis is a modified FDA-approved test that has been validated and its performance characteristics determined by the reporting laboratory.  This laboratory is certified under the Clinical Laboratory Improvement Amendments CLIA as qualified to perform high complexity clinical laboratory testing.    * 20,000 COLONIES/mL PSEUDOMONAS AERUGINOSA  MRSA Next Gen by PCR, Nasal     Status: Abnormal   Collection Time: 07/22/24  5:26 PM   Specimen: Nasal Mucosa; Nasal Swab  Result Value Ref Range Status   MRSA by PCR Next Gen DETECTED (A) NOT DETECTED Final    Comment: RESULT CALLED TO, READ BACK BY AND VERIFIED WITH: ZOIE MANGAROO RN @2239  07/22/24 ASW (NOTE) The GeneXpert MRSA Assay (FDA approved for NASAL specimens only), is one component of a comprehensive MRSA colonization surveillance program. It is not intended to diagnose MRSA infection nor to guide or monitor treatment for MRSA infections. Test performance is not FDA approved in patients less than 28 years old. Performed at [facility], [address] [place], [place] [address]   Blood Culture (routine x 2)     Status: None (Preliminary result)   Collection Time: 07/22/24   9:00 PM   Specimen: Right Antecubital; Blood  Result Value Ref Range Status   Specimen Description RIGHT ANTECUBITAL  Final   Special Requests   Final    BOTTLES DRAWN AEROBIC AND ANAEROBIC Blood Culture adequate volume   Culture   Final    NO GROWTH 3 DAYS  Performed at [facility], [address] [place], [place] [address]    Report Status PENDING  Incomplete      Radiology Studies last 3 days: DG Chest Port 1 View Result Date: 07/25/2024 CLINICAL DATA:  No Mon yeah. EXAM: PORTABLE CHEST 1 VIEW COMPARISON:  Radiograph and CT 07/22/2024 FINDINGS: The heart is upper normal in size. Stable mediastinal contours. Aortic atherosclerosis. There are coronary stents. Improvement in heterogeneous bilateral lung opacities. Mild patchy residual persists at the bases. Elevated right hemidiaphragm. No large pleural effusion. No visible pneumothorax. IMPRESSION: Improvement in heterogeneous bilateral lung opacities from prior exam. Mild patchy residual persists at the bases. Electronically Signed   By: Andrea Gasman M.D.   On: 07/25/2024 14:14   CT Angio Chest PE W and/or Wo Contrast Result Date: 07/22/2024 CLINICAL DATA:  Shortness of breath. EXAM: CT ANGIOGRAPHY CHEST WITH CONTRAST TECHNIQUE: Multidetector CT imaging of the chest was performed using the standard protocol during bolus administration of intravenous contrast. Multiplanar CT image reconstructions and MIPs were obtained to evaluate the vascular anatomy. RADIATION DOSE REDUCTION: This exam was performed according to the departmental dose-optimization program which includes automated exposure control, adjustment of the mA and/or kV according to patient size and/or use of iterative reconstruction technique. CONTRAST:  75mL OMNIPAQUE  IOHEXOL  350 MG/ML SOLN COMPARISON:  January 24, 2020 FINDINGS: Cardiovascular: Satisfactory opacification of the pulmonary arteries to the segmental level. No evidence of pulmonary embolism. There is mild  cardiomegaly with marked severity coronary artery calcification no pericardial effusion. Mediastinum/Nodes: No enlarged mediastinal, hilar, or axillary lymph nodes. Thyroid gland, trachea, and esophagus demonstrate no significant findings. Lungs/Pleura: Moderate to marked severity multifocal infiltrates are seen throughout both lungs. Mild compressive atelectasis is also noted within the posterior aspect of the bilateral lower lobes. There are small bilateral pleural effusions. No pneumothorax is identified. Upper Abdomen: Ill-defined gallstones are seen within the gallbladder lumen. Musculoskeletal: Postoperative changes are seen within the lower cervical spine. No acute osseous abnormalities are identified. Review of the MIP images confirms the above findings. IMPRESSION: 1. No evidence of pulmonary embolism. 2. Moderate to marked severity multifocal bilateral infiltrates. 3. Small bilateral pleural effusions. 4. Cholelithiasis. Electronically Signed   By: Suzen Dials M.D.   On: 07/22/2024 11:57   DG Chest Port 1 View Result Date: 07/22/2024 EXAM: 1 VIEW(S) XRAY OF THE CHEST 07/22/2024 04:21:00 AM COMPARISON: Portable chest, 07/07/2024. CLINICAL HISTORY: Questionable sepsis - evaluate for abnormality. FINDINGS: LINES, TUBES AND DEVICES: Monitor wires are present. LUNGS AND PLEURA: Increased central vascular congestion and moderate diffuse interstitial edema with a basal gradient. Increased perihilar haziness which is probably ground glass edema. Small pleural effusions are forming. No pneumothorax. No other focal airspace process is seen. HEART AND MEDIASTINUM: Mild cardiomegaly. Calcification in the transverse aorta. Calcifications and stents in the LAD coronary artery. Unremarkable mediastinum. BONES AND SOFT TISSUES: Artifact from overlying clothing. No acute osseous abnormality. Partially visible lower cervical spine fusion plating. IMPRESSION: 1. Increased central vascular congestion and moderate  diffuse interstitial edema with basal gradient, with probable ground-glass edema centrally. 2. Small pleural effusions. 3. Mild cardiomegaly. Electronically signed by: Francis Quam MD 07/22/2024 04:55 AM EST RP Workstation: [id number]          Laneta Blunt, DO [facility] 07/25/2024, 4:07 PM    Dictation software may have been used to generate the above note. Typos may occur and escape review in typed/dictated notes. Please contact Dr Blunt directly for clarity if needed.  Staff may message me  via secure chat in Epic  but this may not receive an immediate response,  please page me for urgent matters!  If 7PM-7AM, please contact night coverage [email or web address]       "

## 2024-07-26 MED ORDER — DOXYCYCLINE HYCLATE 100 MG PO TABS: 100.0000 mg | ORAL_TABLET | Freq: Two times a day (BID) | ORAL | Status: AC

## 2024-07-26 MED ORDER — FUROSEMIDE 40 MG PO TABS: 40.0000 mg | ORAL_TABLET | Freq: Two times a day (BID) | ORAL | Status: AC

## 2024-07-26 MED ORDER — SPIRONOLACTONE 25 MG PO TABS: 12.5000 mg | ORAL_TABLET | Freq: Every day | ORAL | Status: AC

## 2024-07-26 MED ORDER — ROSUVASTATIN CALCIUM 20 MG PO TABS: 20.0000 mg | ORAL_TABLET | Freq: Every day | ORAL | Status: AC

## 2024-07-26 MED ORDER — MUPIROCIN 2 % EX OINT: 1.0000 | TOPICAL_OINTMENT | Freq: Two times a day (BID) | CUTANEOUS | Status: DC

## 2024-07-26 MED ORDER — PANTOPRAZOLE SODIUM 40 MG PO TBEC: 40.0000 mg | DELAYED_RELEASE_TABLET | Freq: Every day | ORAL | Status: DC

## 2024-07-26 MED ORDER — FLUTICASONE PROPIONATE 50 MCG/ACT NA SUSP: 1.0000 | Freq: Every day | NASAL | Status: AC | PRN

## 2024-07-26 MED ORDER — PROMETHAZINE HCL 12.5 MG RE SUPP: 12.5000 mg | Freq: Four times a day (QID) | RECTAL | Status: AC | PRN

## 2024-07-26 MED ORDER — IPRATROPIUM-ALBUTEROL 0.5-2.5 (3) MG/3ML IN SOLN: 3.0000 mL | RESPIRATORY_TRACT | Status: AC | PRN

## 2024-07-26 MED ORDER — POTASSIUM CHLORIDE 20 MEQ PO PACK: 40.0000 meq | PACK | Freq: Every day | ORAL | Status: AC

## 2024-07-26 MED ORDER — AMOXICILLIN-POT CLAVULANATE 875-125 MG PO TABS: 1.0000 | ORAL_TABLET | Freq: Two times a day (BID) | ORAL | Status: AC

## 2024-07-26 MED ORDER — POTASSIUM CHLORIDE 20 MEQ PO PACK
40.0000 meq | PACK | Freq: Two times a day (BID) | ORAL | Status: DC
  Administered 2024-07-26: 40 meq via ORAL
  Filled 2024-07-26: qty 2

## 2024-07-26 MED ORDER — METOPROLOL SUCCINATE ER 25 MG PO TB24: 12.5000 mg | ORAL_TABLET | Freq: Every day | ORAL | Status: AC

## 2024-07-26 MED ORDER — ENSURE PLUS HIGH PROTEIN PO LIQD: 237.0000 mL | Freq: Three times a day (TID) | ORAL | Status: AC

## 2024-07-26 NOTE — Discharge Summary (Addendum)
 "    Physician Discharge Summary   Patient: Victoria Medina MRN: [id number]  DOB: [birth year]   Admit:     Date of Admission: 07/22/2024 Admitted from: hassisted living   Discharge: Date of discharge: 07/26/2024 Disposition: Assisted living Condition at discharge: good  CODE STATUS: FULL CODE     Discharge Physician: Laneta Blunt, DO [facility]     PCP: [name], DO  Recommendations for Outpatient Follow-up:  Follow up with PCP [name], DO in 1-2 weeks  Discharge Instructions     Increase activity slowly   Complete by: As directed          Discharge Diagnoses: Principal Problem:   Pneumonia Active Problems:   Essential hypertension   Acquired hypothyroidism   GAD (generalized anxiety disorder)   GERD (gastroesophageal reflux disease)   Obstructive sleep apnea (adult) (pediatric)   DNR (do not resuscitate)   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)   T2DM (type 2 diabetes mellitus) (HCC)   Cortical blindness   Mixed hyperlipidemia   Suprapubic catheter (HCC)   Coronary artery disease involving native coronary artery of native heart with unstable angina pectoris (HCC)   Chronic systolic CHF (congestive heart failure) (HCC) - LVEF 20-25%   Major depressive disorder, recurrent, moderate (HCC)   Acute hypoxic respiratory failure [facility])       Hospital course / significant events:   HPI:  Victoria Medina is a pleasant 63 y.o. 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  Our service is known to this patient due to frequent admission.  She stated  that she has been coughing for the last 1 week and was prescribed cough medications.  She did not get better rather she got worse with her shortness of breath to the point that she was not able to breathe this morning. Patient was discharged to nursing home after being admitted to hospital between 12/8 to 12/13 for severe hyponatremia and congestive heart failure. Per EMS patient was hypoxic to 60%, oxygen level did not improve and placed her on CPAP.   12/23: to ED. achycardic at 107, tachypneic at 39 hypoxic at 80% on room air placed on BiPAP. EKG showed sinus rhythm at 93 bpm, no ST elevation, chest x-ray showed pleural effusion. Patient had a leukocytosis at 16.8 lactate was 2.8, sodium was 134, potassium 3.4, creatinine was 1.13, ABG showed pH of 7.31, pCO2 of 53, pO2 was 58. Patient was given cefepime  and vancomycin , 40 mg of Lasix  IV, Solu-Medrol  40 mg IV, replace potassium 10 mEq. Admitted to hospitalist w/ PNA, hypoxic resp fail. No PE. 12/24: O2 requirement improved through today, still needing 2L   12/25: SOB off O2 today, remain on 2L. Blood cultures no growth x2 days  12/26: saturating okay off O2 but pt reports SOB without it and would like it back in place. CXR repeat shows interval improvement.  12/27 doing well today on room air      Consultants:  none  Procedures/Surgeries: none      ASSESSMENT & PLAN:   Sepsis present on admission, severe, w/ hypoxic respiratory failure d/t healthcare-associated pneumonia Multifocal bilateral lung infiltrates on CT chest - clinically significant  (+)MRSA screen  Dinish course doxy + augmentin   Cough meds prn    Chronic systolic and diastolic congestive heart failure.  Essential HTN CAD, Hx STEMI Patient received 1 dose of IV Lasix  in the emergency room. continue Lasix  40 mg twice daily - of note, per SNF records she was getting only once a day Lasix . HOLD amlodipine  2.5 mg daily, losartan  100 mg daily - given low BP here reduced  home spironolactone  12.5 mg daily  Reduced home metoprolol  succinate to 12.5 mg daily  Continue statin, ranolazine , isosorbide , aspirin   Hypokalemia Replace as needed Monitor BMP  Blindness L arm contracture Please leave items in easy reach of R hand  Hx CVA w/ residual L side deficits  Statin, ASA  Hx PE Not on [facility] outpatient  Hypokalemia Replace as needed - sent home on supplementation now that on higher dose lasix   Monitor BMP   Hypothyroidism levothyroxine    Anxiety/depression Conintue diazepam  10 mg every 6 hours as needed Continue olanzapine    GERD PPI  Chronic Foley Bacterial colonization Exchange Foley routinely   Class 1 obesity based on BMI: Body mass index is 33.78 kg/m.SABRA Significantly low or high BMI is associated with higher medical risk.  Underweight - under 18  overweight - 25 to 29 obese - 30 or more Class 1 obesity: BMI of 30.0 to 34 Class 2 obesity: BMI of 35.0 to 39 Class 3 obesity: BMI of 40.0 to 49 Super Morbid Obesity: BMI 50-59 Super-super Morbid Obesity: BMI 60+ Healthy nutrition and physical activity advised as adjunct to other disease management and risk reduction treatments            Discharge Instructions  Allergies as of 07/26/2024       Reactions   Levaquin [levofloxacin In D5w] Anaphylaxis, Swelling   Iodinated Contrast Media Itching   Severe itching   Other    Dust mites and opiates (all of them)   Latex Dermatitis        Medication List     STOP taking these medications    amLODipine  2.5 MG tablet Commonly known as: NORVASC    losartan  100 MG tablet Commonly known as: COZAAR        TAKE these medications    amoxicillin -clavulanate 875-125 MG tablet Commonly known as: AUGMENTIN  Take 1 tablet by mouth 2 (two) times daily with a meal for 3 doses.   aspirin  EC 81 MG tablet Take 81 mg by mouth daily.   Cough Drops 2.7 MG Lozg Generic drug: Menthol 1 tablet by Transmucosal route every 4 (four)  hours as needed.   COUGH/SORE THROAT LOZENGES MT Use as directed 1 drop in the mouth or throat every 4 (four) hours as needed.   diazepam  10 MG tablet Commonly known as: VALIUM  Take 10 mg by mouth every 6 (six) hours as needed for anxiety.   doxycycline  100 MG tablet Commonly known as: VIBRA -TABS Take 1 tablet (100 mg total) by mouth 2 (two) times daily with a meal for 3 days.   feeding supplement Liqd Take 237 mLs by mouth 3 (three) times daily.   fluticasone  50 MCG/ACT nasal spray Commonly known as: FLONASE  Place 1 spray into both nostrils daily as needed for allergies or rhinitis. What changed:  when to take this reasons to take this   furosemide  40 MG tablet Commonly known as: LASIX  Take 1 tablet (40 mg total) by mouth 2 (two) times daily. What changed: when to take this   Gemtesa  75 MG Tabs Generic drug: Vibegron  Take 75  mg by mouth daily.   ibuprofen  800 MG tablet Commonly known as: ADVIL  Take 800 mg by mouth 2 (two) times daily as needed for mild pain (pain score 1-3) or moderate pain (pain score 4-6).   ipratropium-albuterol  0.5-2.5 (3) MG/3ML Soln Commonly known as: DUONEB Take 3 mLs by nebulization every 4 (four) hours as needed.   isosorbide  mononitrate 60 MG 24 hr tablet Commonly known as: IMDUR  Take 60 mg by mouth daily.   lansoprazole 15 MG capsule Commonly known as: PREVACID Take 15 mg by mouth 2 (two) times daily before a meal.   levothyroxine  25 MCG tablet Commonly known as: SYNTHROID  Take 25 mcg by mouth daily before breakfast.   magnesium  hydroxide 400 MG/5ML suspension Commonly known as: MILK OF MAGNESIA Take 5 mLs by mouth every 6 (six) hours as needed for mild constipation.   metoprolol  succinate 25 MG 24 hr tablet Commonly known as: TOPROL -XL Take 0.5 tablets (12.5 mg total) by mouth daily. Start taking on: July 27, 2024 What changed: how much to take   mupirocin  ointment 2 % Commonly known as: BACTROBAN  Place 1 Application  into the nose 2 (two) times daily.   nitroGLYCERIN  0.4 MG SL tablet Commonly known as: NITROSTAT  Place 0.4 mg under the tongue every 5 (five) minutes x 3 doses as needed for chest pain.   OLANZapine  5 MG tablet Commonly known as: ZYPREXA  Take 5 mg by mouth daily.   ondansetron  4 MG disintegrating tablet Commonly known as: ZOFRAN -ODT Take 4 mg by mouth every 4 (four) hours as needed for nausea or vomiting.   polyethylene glycol 17 g packet Commonly known as: MIRALAX  / GLYCOLAX  Take 17 g by mouth daily.   potassium chloride  20 MEQ packet Commonly known as: KLOR-CON  Take 40 mEq by mouth daily.   promethazine  12.5 MG suppository Commonly known as: PHENERGAN  Place 1 suppository (12.5 mg total) rectally every 6 (six) hours as needed for refractory nausea / vomiting. What changed: reasons to take this   ranolazine  500 MG 12 hr tablet Commonly known as: RANEXA  Take 500 mg by mouth 2 (two) times daily.   rosuvastatin  20 MG tablet Commonly known as: CRESTOR  Take 1 tablet (20 mg total) by mouth daily. Start taking on: July 27, 2024 What changed:  medication strength how much to take additional instructions   senna-docusate 8.6-50 MG tablet Commonly known as: Senokot-S Take 2 tablets by mouth at bedtime.   spironolactone  25 MG tablet Commonly known as: ALDACTONE  Take 0.5 tablets (12.5 mg total) by mouth daily. Start taking on: July 27, 2024   Vitamin D3 1.25 MG (50000 UT) Caps Take 1 capsule by mouth once a week.  every Mon for Supplement         Follow-up Information     [name], DO Follow up.   Specialty: Internal Medicine Why: Hospital follow up Contact information: [address] [place] [address] [phone]                 Allergies[1]   Subjective: pt feelin gbetter today, Some cough. No SOB at rest or on exertion   Discharge Exam: BP 116/81 (BP Location: Right Arm)   Pulse 85   Temp 98.7 F (37.1 C) (Oral)    Resp 17   Ht 5' 1 (1.549 m)   Wt 81.1 kg   SpO2 96%   BMI 33.78 kg/m  General: Pt is alert, awake, not in acute distress Cardiovascular: RRR, S1/S2 +, no rubs, no  gallops Respiratory: CTA bilaterally, no wheezing, no rhonchi Abdominal: Soft, NT, ND, bowel sounds + Extremities: no edema, no cyanosis     The results of significant diagnostics from this hospitalization (including imaging, microbiology, ancillary and laboratory) are listed below for reference.     Microbiology: Recent Results (from the past 240 hours)  Blood Culture (routine x 2)     Status: None (Preliminary result)   Collection Time: 07/22/24  3:58 AM   Specimen: BLOOD RIGHT ARM  Result Value Ref Range Status   Specimen Description   Final    BLOOD RIGHT ARM Performed at [facility], [address], [place], [place] [address]    Special Requests   Final    BOTTLES DRAWN AEROBIC AND ANAEROBIC Blood Culture adequate volume   Culture  Setup Time PENDING  Incomplete   Culture   Final    NO GROWTH 4 DAYS Performed at [facility], [address], [place], [place] [address]    Report Status PENDING  Incomplete  Resp panel by RT-PCR (RSV, Flu A&B, Covid) Anterior Nasal Swab     Status: None   Collection Time: 07/22/24  5:00 AM   Specimen: Anterior Nasal Swab  Result Value Ref Range Status   SARS Coronavirus 2 by RT PCR NEGATIVE NEGATIVE Final    Comment: (NOTE) SARS-CoV-2 target nucleic acids are NOT DETECTED.  The SARS-CoV-2 RNA is generally detectable in upper respiratory specimens during the acute phase of infection. The lowest concentration of SARS-CoV-2 viral copies this assay can detect is 138 copies/mL. A negative result does not preclude SARS-Cov-2 infection and should not be used as the sole basis for treatment or other patient management decisions. A negative result may occur with  improper specimen collection/handling, submission of specimen other than nasopharyngeal swab,  presence of viral mutation(s) within the areas targeted by this assay, and inadequate number of viral copies(<138 copies/mL). A negative result must be combined with clinical observations, patient history, and epidemiological information. The expected result is Negative.  Fact Sheet for Patients:  bloggercourse.com  Fact Sheet for Healthcare Providers:  seriousbroker.it  This test is no t yet approved or cleared by the [place]  FDA and  has been authorized for detection and/or diagnosis of SARS-CoV-2 by FDA under an Emergency Use Authorization (EUA). This EUA will remain  in effect (meaning this test can be used) for the duration of the COVID-19 declaration under Section 564(b)(1) of the Act, 21 U.S.C.section 360bbb-3(b)(1), unless the authorization is terminated  or revoked sooner.       Influenza A by PCR NEGATIVE NEGATIVE Final   Influenza B by PCR NEGATIVE NEGATIVE Final    Comment: (NOTE) The Xpert Xpress SARS-CoV-2/FLU/RSV plus assay is intended as an aid in the diagnosis of influenza from Nasopharyngeal swab specimens and should not be used as a sole basis for treatment. Nasal washings and aspirates are unacceptable for Xpert Xpress SARS-CoV-2/FLU/RSV testing.  Fact Sheet for Patients: bloggercourse.com  Fact Sheet for Healthcare Providers: seriousbroker.it  This test is not yet approved or cleared by the [place]  FDA and has been authorized for detection and/or diagnosis of SARS-CoV-2 by FDA under an Emergency Use Authorization (EUA). This EUA will remain in effect (meaning this test can be used) for the duration of the COVID-19 declaration under Section 564(b)(1) of the Act, 21 U.S.C. section 360bbb-3(b)(1), unless the authorization is terminated or revoked.     Resp Syncytial Virus by PCR NEGATIVE NEGATIVE Final  Comment: (NOTE) Fact Sheet for  Patients: bloggercourse.com  Fact Sheet for Healthcare Providers: seriousbroker.it  This test is not yet approved or cleared by the [place]  FDA and has been authorized for detection and/or diagnosis of SARS-CoV-2 by FDA under an Emergency Use Authorization (EUA). This EUA will remain in effect (meaning this test can be used) for the duration of the COVID-19 declaration under Section 564(b)(1) of the Act, 21 U.S.C. section 360bbb-3(b)(1), unless the authorization is terminated or revoked.  Performed at [facility], [address] [place], [place] [address]   Urine Culture     Status: Abnormal   Collection Time: 07/22/24  2:00 PM   Specimen: Urine, Random  Result Value Ref Range Status   Specimen Description   Final    URINE, RANDOM Performed at [facility], [address] [place], [place] [address]    Special Requests   Final    NONE Reflexed from [id number] Performed at [facility], [address] [place], [place] [address]    Culture (A)  Final    20,000 COLONIES/mL PSEUDOMONAS AERUGINOSA >=100,000 COLONIES/mL ENTEROCOCCUS FAECALIS    Report Status 07/25/2024 FINAL  Final   Organism ID, Bacteria PSEUDOMONAS AERUGINOSA (A)  Final   Organism ID, Bacteria ENTEROCOCCUS FAECALIS (A)  Final      Susceptibility   Enterococcus faecalis - MIC*    AMPICILLIN <=2 SENSITIVE Sensitive     NITROFURANTOIN  <=16 SENSITIVE Sensitive     VANCOMYCIN  1 SENSITIVE Sensitive     * >=100,000 COLONIES/mL ENTEROCOCCUS FAECALIS   Pseudomonas aeruginosa - MIC*    MEROPENEM 1 SENSITIVE Sensitive     CIPROFLOXACIN 0.12 SENSITIVE Sensitive     IMIPENEM 2 SENSITIVE Sensitive     PIP/TAZO Value in next row Sensitive      <=4 SENSITIVEThis is a modified FDA-approved test that has been validated and its performance characteristics determined by the reporting laboratory.  This laboratory is certified under the Clinical  Laboratory Improvement Amendments CLIA as qualified to perform high complexity clinical laboratory testing.    CEFEPIME  Value in next row Sensitive      <=4 SENSITIVEThis is a modified FDA-approved test that has been validated and its performance characteristics determined by the reporting laboratory.  This laboratory is certified under the Clinical Laboratory Improvement Amendments CLIA as qualified to perform high complexity clinical laboratory testing.    CEFTAZIDIME/AVIBACTAM Value in next row Sensitive      <=4 SENSITIVEThis is a modified FDA-approved test that has been validated and its performance characteristics determined by the reporting laboratory.  This laboratory is certified under the Clinical Laboratory Improvement Amendments CLIA as qualified to perform high complexity clinical laboratory testing.    CEFTOLOZANE/TAZOBACTAM Value in next row Sensitive      <=4 SENSITIVEThis is a modified FDA-approved test that has been validated and its performance characteristics determined by the reporting laboratory.  This laboratory is certified under the Clinical Laboratory Improvement Amendments CLIA as qualified to perform high complexity clinical laboratory testing.    TOBRAMYCIN Value in next row Sensitive      <=4 SENSITIVEThis is a modified FDA-approved test that has been validated and its performance characteristics determined by the reporting laboratory.  This laboratory is certified under the Clinical Laboratory Improvement Amendments CLIA as qualified to perform high complexity clinical laboratory testing.    CEFTAZIDIME Value in next row Sensitive      <=4 SENSITIVEThis is a modified FDA-approved test that has been validated and its  performance characteristics determined by the reporting laboratory.  This laboratory is certified under the Clinical Laboratory Improvement Amendments CLIA as qualified to perform high complexity clinical laboratory testing.    * 20,000 COLONIES/mL PSEUDOMONAS  AERUGINOSA  MRSA Next Gen by PCR, Nasal     Status: Abnormal   Collection Time: 07/22/24  5:26 PM   Specimen: Nasal Mucosa; Nasal Swab  Result Value Ref Range Status   MRSA by PCR Next Gen DETECTED (A) NOT DETECTED Final    Comment: RESULT CALLED TO, READ BACK BY AND VERIFIED WITH: ZOIE MANGAROO RN @2239  07/22/24 ASW (NOTE) The GeneXpert MRSA Assay (FDA approved for NASAL specimens only), is one component of a comprehensive MRSA colonization surveillance program. It is not intended to diagnose MRSA infection nor to guide or monitor treatment for MRSA infections. Test performance is not FDA approved in patients less than 17 years old. Performed at [facility], [address] [place], [place] [address]   Blood Culture (routine x 2)     Status: None (Preliminary result)   Collection Time: 07/22/24  9:00 PM   Specimen: Right Antecubital; Blood  Result Value Ref Range Status   Specimen Description RIGHT ANTECUBITAL  Final   Special Requests   Final    BOTTLES DRAWN AEROBIC AND ANAEROBIC Blood Culture adequate volume   Culture   Final    NO GROWTH 4 DAYS Performed at [facility], [address] [place], [place] [address]    Report Status PENDING  Incomplete     Labs: BNP (last 3 results) Recent Labs    06/05/24 1034  BNP 473.9*   Basic Metabolic Panel: Recent Labs  Lab 07/22/24 0358 07/22/24 0530 07/22/24 1726 07/23/24 0626 07/25/24 0817 07/25/24 1013  NA 134*  --   --   --   --  134*  K 3.4*  --   --   --   --  3.2*  CL 92*  --   --   --   --  96*  CO2 24  --   --   --   --  25  GLUCOSE 292*  --   --   --   --  181*  BUN 9  --   --   --   --  20  CREATININE 1.13*  --  0.91 0.85 0.92 0.93  CALCIUM  9.4  --   --   --   --  9.0  MG  --  2.0  --   --   --   --    Liver Function Tests: Recent Labs  Lab 07/22/24 0358  AST 18  ALT 9  ALKPHOS 101  BILITOT 0.6  PROT 7.7  ALBUMIN 4.3   Recent Labs  Lab 07/22/24 0358  LIPASE 33   No  results for input(s): AMMONIA in the last 168 hours. CBC: Recent Labs  Lab 07/22/24 0358 07/22/24 1726 07/25/24 1013  WBC 16.8* 10.7* 6.0  NEUTROABS 14.3*  --   --   HGB 14.2 11.6* 11.8*  HCT 45.3 36.3 36.8  MCV 85.5 84.8 84.2  PLT 551* 369 291   Cardiac Enzymes: No results for input(s): CKTOTAL, CKMB, CKMBINDEX, TROPONINI in the last 168 hours. BNP: Invalid input(s): POCBNP CBG: No results for input(s): GLUCAP in the last 168 hours. D-Dimer No results for input(s): DDIMER in the last 72 hours. Hgb A1c No results for input(s): HGBA1C in the last 72 hours. Lipid Profile No results for input(s): CHOL,  HDL, LDLCALC, TRIG, CHOLHDL, LDLDIRECT in the last 72 hours. Thyroid function studies No results for input(s): TSH, T4TOTAL, T3FREE, THYROIDAB in the last 72 hours.  Invalid input(s): FREET3 Anemia work up No results for input(s): VITAMINB12, FOLATE, FERRITIN, TIBC, IRON, RETICCTPCT in the last 72 hours. Urinalysis    Component Value Date/Time   COLORURINE YELLOW (A) 07/22/2024 1400   APPEARANCEUR CLEAR (A) 07/22/2024 1400   APPEARANCEUR Hazy (A) 09/05/2022 1519   LABSPEC 1.024 07/22/2024 1400   LABSPEC 1.026 08/05/2014 1906   PHURINE 7.0 07/22/2024 1400   GLUCOSEU NEGATIVE 07/22/2024 1400   GLUCOSEU NEGATIVE 08/05/2014 1906   HGBUR MODERATE (A) 07/22/2024 1400   BILIRUBINUR NEGATIVE 07/22/2024 1400   BILIRUBINUR Negative 09/05/2022 1519   BILIRUBINUR NEGATIVE 08/05/2014 1906   KETONESUR NEGATIVE 07/22/2024 1400   PROTEINUR NEGATIVE 07/22/2024 1400   NITRITE POSITIVE (A) 07/22/2024 1400   LEUKOCYTESUR LARGE (A) 07/22/2024 1400   LEUKOCYTESUR 2+ 08/05/2014 1906   Sepsis Labs Recent Labs  Lab 07/22/24 0358 07/22/24 1726 07/25/24 1013  WBC 16.8* 10.7* 6.0   Microbiology Recent Results (from the past 240 hours)  Blood Culture (routine x 2)     Status: None (Preliminary result)   Collection Time: 07/22/24  3:58  AM   Specimen: BLOOD RIGHT ARM  Result Value Ref Range Status   Specimen Description   Final    BLOOD RIGHT ARM Performed at [facility], [address], [place], [place] [address]    Special Requests   Final    BOTTLES DRAWN AEROBIC AND ANAEROBIC Blood Culture adequate volume   Culture  Setup Time PENDING  Incomplete   Culture   Final    NO GROWTH 4 DAYS Performed at [facility], [address] [place], [place] [address]    Report Status PENDING  Incomplete  Resp panel by RT-PCR (RSV, Flu A&B, Covid) Anterior Nasal Swab     Status: None   Collection Time: 07/22/24  5:00 AM   Specimen: Anterior Nasal Swab  Result Value Ref Range Status   SARS Coronavirus 2 by RT PCR NEGATIVE NEGATIVE Final    Comment: (NOTE) SARS-CoV-2 target nucleic acids are NOT DETECTED.  The SARS-CoV-2 RNA is generally detectable in upper respiratory specimens during the acute phase of infection. The lowest concentration of SARS-CoV-2 viral copies this assay can detect is 138 copies/mL. A negative result does not preclude SARS-Cov-2 infection and should not be used as the sole basis for treatment or other patient management decisions. A negative result may occur with  improper specimen collection/handling, submission of specimen other than nasopharyngeal swab, presence of viral mutation(s) within the areas targeted by this assay, and inadequate number of viral copies(<138 copies/mL). A negative result must be combined with clinical observations, patient history, and epidemiological information. The expected result is Negative.  Fact Sheet for Patients:  bloggercourse.com  Fact Sheet for Healthcare Providers:  seriousbroker.it  This test is no t yet approved or cleared by the [place]  FDA and  has been authorized for detection and/or diagnosis of SARS-CoV-2 by FDA under an Emergency Use Authorization (EUA). This EUA will remain   in effect (meaning this test can be used) for the duration of the COVID-19 declaration under Section 564(b)(1) of the Act, 21 U.S.C.section 360bbb-3(b)(1), unless the authorization is terminated  or revoked sooner.       Influenza A by PCR NEGATIVE NEGATIVE Final   Influenza B by PCR NEGATIVE NEGATIVE Final    Comment: (NOTE)  The Xpert Xpress SARS-CoV-2/FLU/RSV plus assay is intended as an aid in the diagnosis of influenza from Nasopharyngeal swab specimens and should not be used as a sole basis for treatment. Nasal washings and aspirates are unacceptable for Xpert Xpress SARS-CoV-2/FLU/RSV testing.  Fact Sheet for Patients: bloggercourse.com  Fact Sheet for Healthcare Providers: seriousbroker.it  This test is not yet approved or cleared by the [place]  FDA and has been authorized for detection and/or diagnosis of SARS-CoV-2 by FDA under an Emergency Use Authorization (EUA). This EUA will remain in effect (meaning this test can be used) for the duration of the COVID-19 declaration under Section 564(b)(1) of the Act, 21 U.S.C. section 360bbb-3(b)(1), unless the authorization is terminated or revoked.     Resp Syncytial Virus by PCR NEGATIVE NEGATIVE Final    Comment: (NOTE) Fact Sheet for Patients: [employer]  Fact Sheet for Healthcare Providers: seriousbroker.it  This test is not yet approved or cleared by the [place]  FDA and has been authorized for detection and/or diagnosis of SARS-CoV-2 by FDA under an Emergency Use Authorization (EUA). This EUA will remain in effect (meaning this test can be used) for the duration of the COVID-19 declaration under Section 564(b)(1) of the Act, 21 U.S.C. section 360bbb-3(b)(1), unless the authorization is terminated or revoked.  Performed at [facility], [address] [place], [place] [address]   Urine  Culture     Status: Abnormal   Collection Time: 07/22/24  2:00 PM   Specimen: Urine, Random  Result Value Ref Range Status   Specimen Description   Final    URINE, RANDOM Performed at [facility], [address] [place], [place] [address]    Special Requests   Final    NONE Reflexed from [id number] Performed at [facility], [address] [place], [place] [address]    Culture (A)  Final    20,000 COLONIES/mL PSEUDOMONAS AERUGINOSA >=100,000 COLONIES/mL ENTEROCOCCUS FAECALIS    Report Status 07/25/2024 FINAL  Final   Organism ID, Bacteria PSEUDOMONAS AERUGINOSA (A)  Final   Organism ID, Bacteria ENTEROCOCCUS FAECALIS (A)  Final      Susceptibility   Enterococcus faecalis - MIC*    AMPICILLIN <=2 SENSITIVE Sensitive     NITROFURANTOIN  <=16 SENSITIVE Sensitive     VANCOMYCIN  1 SENSITIVE Sensitive     * >=100,000 COLONIES/mL ENTEROCOCCUS FAECALIS   Pseudomonas aeruginosa - MIC*    MEROPENEM 1 SENSITIVE Sensitive     CIPROFLOXACIN 0.12 SENSITIVE Sensitive     IMIPENEM 2 SENSITIVE Sensitive     PIP/TAZO Value in next row Sensitive      <=4 SENSITIVEThis is a modified FDA-approved test that has been validated and its performance characteristics determined by the reporting laboratory.  This laboratory is certified under the Clinical Laboratory Improvement Amendments CLIA as qualified to perform high complexity clinical laboratory testing.    CEFEPIME  Value in next row Sensitive      <=4 SENSITIVEThis is a modified FDA-approved test that has been validated and its performance characteristics determined by the reporting laboratory.  This laboratory is certified under the Clinical Laboratory Improvement Amendments CLIA as qualified to perform high complexity clinical laboratory testing.    CEFTAZIDIME/AVIBACTAM Value in next row Sensitive      <=4 SENSITIVEThis is a modified FDA-approved test that has been validated and its performance characteristics determined by the  reporting laboratory.  This laboratory is certified under the Clinical Laboratory Improvement Amendments CLIA as qualified to perform high complexity clinical laboratory  testing.    CEFTOLOZANE/TAZOBACTAM Value in next row Sensitive      <=4 SENSITIVEThis is a modified FDA-approved test that has been validated and its performance characteristics determined by the reporting laboratory.  This laboratory is certified under the Clinical Laboratory Improvement Amendments CLIA as qualified to perform high complexity clinical laboratory testing.    TOBRAMYCIN Value in next row Sensitive      <=4 SENSITIVEThis is a modified FDA-approved test that has been validated and its performance characteristics determined by the reporting laboratory.  This laboratory is certified under the Clinical Laboratory Improvement Amendments CLIA as qualified to perform high complexity clinical laboratory testing.    CEFTAZIDIME Value in next row Sensitive      <=4 SENSITIVEThis is a modified FDA-approved test that has been validated and its performance characteristics determined by the reporting laboratory.  This laboratory is certified under the Clinical Laboratory Improvement Amendments CLIA as qualified to perform high complexity clinical laboratory testing.    * 20,000 COLONIES/mL PSEUDOMONAS AERUGINOSA  MRSA Next Gen by PCR, Nasal     Status: Abnormal   Collection Time: 07/22/24  5:26 PM   Specimen: Nasal Mucosa; Nasal Swab  Result Value Ref Range Status   MRSA by PCR Next Gen DETECTED (A) NOT DETECTED Final    Comment: RESULT CALLED TO, READ BACK BY AND VERIFIED WITH: ZOIE MANGAROO RN @2239  07/22/24 ASW (NOTE) The GeneXpert MRSA Assay (FDA approved for NASAL specimens only), is one component of a comprehensive MRSA colonization surveillance program. It is not intended to diagnose MRSA infection nor to guide or monitor treatment for MRSA infections. Test performance is not FDA approved in patients less than 36  years old. Performed at [facility], [address] [place], [place] [address]   Blood Culture (routine x 2)     Status: None (Preliminary result)   Collection Time: 07/22/24  9:00 PM   Specimen: Right Antecubital; Blood  Result Value Ref Range Status   Specimen Description RIGHT ANTECUBITAL  Final   Special Requests   Final    BOTTLES DRAWN AEROBIC AND ANAEROBIC Blood Culture adequate volume   Culture   Final    NO GROWTH 4 DAYS Performed at [facility], [address], [place], [place] [address]    Report Status PENDING  Incomplete   Imaging CT Angio Chest PE W and/or Wo Contrast Result Date: 07/22/2024 CLINICAL DATA:  Shortness of breath. EXAM: CT ANGIOGRAPHY CHEST WITH CONTRAST TECHNIQUE: Multidetector CT imaging of the chest was performed using the standard protocol during bolus administration of intravenous contrast. Multiplanar CT image reconstructions and MIPs were obtained to evaluate the vascular anatomy. RADIATION DOSE REDUCTION: This exam was performed according to the departmental dose-optimization program which includes automated exposure control, adjustment of the mA and/or kV according to patient size and/or use of iterative reconstruction technique. CONTRAST:  75mL OMNIPAQUE  IOHEXOL  350 MG/ML SOLN COMPARISON:  January 24, 2020 FINDINGS: Cardiovascular: Satisfactory opacification of the pulmonary arteries to the segmental level. No evidence of pulmonary embolism. There is mild cardiomegaly with marked severity coronary artery calcification no pericardial effusion. Mediastinum/Nodes: No enlarged mediastinal, hilar, or axillary lymph nodes. Thyroid gland, trachea, and esophagus demonstrate no significant findings. Lungs/Pleura: Moderate to marked severity multifocal infiltrates are seen throughout both lungs. Mild compressive atelectasis is also noted within the posterior aspect of the bilateral lower lobes. There are small bilateral pleural effusions. No  pneumothorax is identified. Upper Abdomen: Ill-defined gallstones are seen within the gallbladder lumen. Musculoskeletal:  Postoperative changes are seen within the lower cervical spine. No acute osseous abnormalities are identified. Review of the MIP images confirms the above findings. IMPRESSION: 1. No evidence of pulmonary embolism. 2. Moderate to marked severity multifocal bilateral infiltrates. 3. Small bilateral pleural effusions. 4. Cholelithiasis. Electronically Signed   By: Suzen Dials M.D.   On: 07/22/2024 11:57   DG Chest Port 1 View Result Date: 07/22/2024 EXAM: 1 VIEW(S) XRAY OF THE CHEST 07/22/2024 04:21:00 AM COMPARISON: Portable chest, 07/07/2024. CLINICAL HISTORY: Questionable sepsis - evaluate for abnormality. FINDINGS: LINES, TUBES AND DEVICES: Monitor wires are present. LUNGS AND PLEURA: Increased central vascular congestion and moderate diffuse interstitial edema with a basal gradient. Increased perihilar haziness which is probably ground glass edema. Small pleural effusions are forming. No pneumothorax. No other focal airspace process is seen. HEART AND MEDIASTINUM: Mild cardiomegaly. Calcification in the transverse aorta. Calcifications and stents in the LAD coronary artery. Unremarkable mediastinum. BONES AND SOFT TISSUES: Artifact from overlying clothing. No acute osseous abnormality. Partially visible lower cervical spine fusion plating. IMPRESSION: 1. Increased central vascular congestion and moderate diffuse interstitial edema with basal gradient, with probable ground-glass edema centrally. 2. Small pleural effusions. 3. Mild cardiomegaly. Electronically signed by: Francis Quam MD 07/22/2024 04:55 AM EST RP Workstation: [id number]      Time coordinating discharge: over 30 minutes  SIGNED:  Laneta Blunt DO [facility]       [1]  Allergies Allergen Reactions   Levaquin [Levofloxacin In D5w] Anaphylaxis and Swelling   Iodinated Contrast Media Itching     Severe itching   Other     Dust mites and opiates (all of them)   Latex Dermatitis   "

## 2024-07-26 NOTE — Progress Notes (Signed)
 Report call to [facility] at [facility], patient going to room 405. RN reported to [employer] at [phone].

## 2024-07-26 NOTE — TOC Transition Note (Signed)
 Transition of Care [facility]) - Discharge Note   Patient Details  Name: Victoria Medina MRN: [id number] Date of Birth: [birth year]  [facility] CM/SW Contact:  Victory Jackquline RAMAN, RN Phone Number: 07/26/2024, 11:03 AM   Clinical Narrative:  Patient discharging back to [facility] at [facility] RM# 405. Report to be called to [phone]. Transportation set up with [facility] and there are 4 people ahead of her. MD and bedside nurse made aware. RNCM called and informed the patient's sister Marta @ [phone] of patient discharging to [facility]. No further concerns. RNCM signing off.    Final next level of care: Skilled Nursing Facility Barriers to Discharge: Barriers Resolved   Patient Goals and CMS Choice            Discharge Placement                Patient to be transferred to facility by: [facility] Name of family member notified: [name]/Sister Patient and family notified of of transfer: 07/26/24  Discharge Plan and Services Additional resources added to the After Visit Summary for     Discharge Planning Services: CM Consult            DME Arranged: N/A         HH Arranged: NA          Social Drivers of Health (SDOH) Interventions SDOH Screenings   Food Insecurity: No Food Insecurity (07/23/2024)  Housing: Low Risk (07/23/2024)  Transportation Needs: No Transportation Needs (07/23/2024)  Utilities: Not At Risk (07/23/2024)  Depression (PHQ2-9): Low Risk (06/10/2024)  Social Connections: Socially Isolated (07/09/2024)  Tobacco Use: Low Risk (07/22/2024)     Readmission Risk Interventions     No data to display

## 2024-07-26 NOTE — Plan of Care (Signed)

## 2024-07-26 NOTE — Plan of Care (Signed)

## 2024-07-27 LAB — BLOOD CULTURE ID PANEL (REFLEXED) - BCID2

## 2024-07-27 LAB — CULTURE, BLOOD (ROUTINE X 2)
Culture: NO GROWTH
Special Requests: ADEQUATE

## 2024-07-28 ENCOUNTER — Non-Acute Institutional Stay (SKILLED_NURSING_FACILITY): Admitting: Orthopedic Surgery

## 2024-07-28 ENCOUNTER — Encounter: Payer: Self-pay | Admitting: Orthopedic Surgery

## 2024-07-28 DIAGNOSIS — F331 Major depressive disorder, recurrent, moderate: Secondary | ICD-10-CM

## 2024-07-28 DIAGNOSIS — Z978 Presence of other specified devices: Secondary | ICD-10-CM

## 2024-07-28 DIAGNOSIS — E039 Hypothyroidism, unspecified: Secondary | ICD-10-CM | POA: Diagnosis not present

## 2024-07-28 DIAGNOSIS — E876 Hypokalemia: Secondary | ICD-10-CM

## 2024-07-28 DIAGNOSIS — J188 Other pneumonia, unspecified organism: Secondary | ICD-10-CM

## 2024-07-28 DIAGNOSIS — H47619 Cortical blindness, unspecified side of brain: Secondary | ICD-10-CM

## 2024-07-28 DIAGNOSIS — Z8673 Personal history of transient ischemic attack (TIA), and cerebral infarction without residual deficits: Secondary | ICD-10-CM | POA: Diagnosis not present

## 2024-07-28 DIAGNOSIS — E66811 Obesity, class 1: Secondary | ICD-10-CM | POA: Diagnosis not present

## 2024-07-28 DIAGNOSIS — I5042 Chronic combined systolic (congestive) and diastolic (congestive) heart failure: Secondary | ICD-10-CM | POA: Diagnosis not present

## 2024-07-28 DIAGNOSIS — J9601 Acute respiratory failure with hypoxia: Secondary | ICD-10-CM

## 2024-07-28 LAB — BASIC METABOLIC PANEL WITH GFR
BUN: 24 — AB (ref 4–21)
CO2: 27 — AB (ref 13–22)
Chloride: 96 — AB (ref 99–108)
Creatinine: 1 (ref 0.5–1.1)
Glucose: 146
Potassium: 4 meq/L (ref 3.5–5.1)
Sodium: 136 — AB (ref 137–147)

## 2024-07-28 LAB — COMPREHENSIVE METABOLIC PANEL WITH GFR
Calcium: 9 (ref 8.7–10.7)
eGFR: 66

## 2024-07-28 NOTE — Progress Notes (Signed)
 " Location:  Other [place].  Nursing Home Room Number: [facility] [id number] Place of Service:  SNF [phone]) Provider:  Greig Cluster, NP  PCP: [name], DO  Patient Care Team: [name], DO as PCP - General (Internal Medicine) [name], MD as PCP - Cardiology (Cardiology)  Extended Emergency Contact Information Primary Emergency Contact: Enslow (POA),[name]  [place] Home Phone: [phone] Mobile Phone: [phone] Relation: Sister Secondary Emergency Contact: [name] Address: [id number]          [place], [place] [address] [place] Home Phone: [phone] Relation: Mother  Code Status:  DNR Goals of care: Advanced Directive information    07/22/2024    3:58 AM  Advanced Directives  Does Patient Have a Medical Advance Directive? Yes  Type of Advance Directive Out of facility DNR (pink MOST or yellow form)  Does patient want to make changes to medical advance directive? No - Patient declined     Chief Complaint  Patient presents with   Hospitalization Follow-up    Hospital Follow up    HPI:  Pt is a 63 y.o. female seen today for f/u s/p hospitalization 12/23-12/27 due to respiratory distress.   She currently resides on the skilled nursing unit at [facility]. PMH: NSTEMI, CAD, DVT, PE, HTN, HFrEF, migraines, OSA, GERD, hypothyroidism, stroke with hemiplegia, myalgias, cervical intraepithelial glandular neoplasia, obesity, suprapubic catheter, anxiety and psychosis.   12/08 she was sent to the ED due to sob, chest pain and increased confusion. Na+ was 114, repeat 119. BNP was 4983. She was admitted to stepdown due to acute CHF and hyponatremia. Condition improved with IV furosemide  and po spirolactone. She did require BIPAP for a brief period of time. Sodium level improved with diuretics. Echo revealed EF 20-25%, grade III DD. Cardiology recommended po furosemide  and f/u in 1 month. Na+ improved to 133 at discharge.  Fluid restriction 1.2L daily recommended.   12/23 she was tachypneic and tachycardic. She was sent to ED for evaluation. CXR showed pleural effusion.CT chest noted multifocal bilateral lung infiltrates. EKG NSR with no ST elevation. WBC 16.8, lactic acid 2.8, Na+ 134, K 3.4, creat 1.13. ABG showed pH of 7.31, pCO2 of 53, pO2 was 58. She was admitted for acute respiratory failure and PNA. Condition improved with cefepime , vancomycin , furosemide  and solumedrol. Blood cultures showed no growth. Blood pressures improved by holding amlodipine  and losartan . Spirolactone and metoprolol  were reduced. Potassium was also replenished. She was discharged back to skilled nursing with doxycycline  and Augmentin .   Today, she denies chest pain or sob. O2 sats > 95 % on RA. She is eating and drinking well. Remains on fluid restriction daily. Afebrile, vitals stable.    Past Medical History:  Diagnosis Date   Acute ST elevation myocardial infarction (STEMI) of inferolateral wall (HCC) 01/25/2020   Blind    Cervical intraepithelial glandular neoplasia 02/07/2011   Cholecystitis 04/22/2021   DVT (deep venous thrombosis) (HCC) 04/03/2012   History of migraine headaches    History of pulmonary embolism 04/02/2012   September 2013 after knee injury- hyperextension- . U/S of lower extremity revealed DVT in L leg. CTA revealed bilateral lower lobe segmental PEs and R upper lobe segmental PE. Patient was started on lovenox  and transferred upstairs. Cardiac enzymes were negative x1. NOAC for 7 months.         History of stroke 10/09/2021   Hyperlipemia    Myocardial infarction [facility])    NSTEMI (non-ST  elevated myocardial infarction) (HCC) 10/27/2013   Formatting of this note might be different from the original.  01/08/2014: LHC: 2nd Marginal: 99% (pre) to 0% (post); STENT, PROMUS PREMIER MR 2.25X12MM     12/04/2013: LHC: Distal LAD: 70% (pre) to 0% (post) CATH, MINI TREK RX 2.0X20MM STENT, PROMUS PREMIER MR 2.25X28MM; Mid  LAD: 70% (pre) to 0% (post), STENT, PROMUS PREMIER MR 2.50X12MM     10/27/2013. LHC: 3 vessel Disease, Stent x2:  Proximal LAD:   Stroke [facility])    Past Surgical History:  Procedure Laterality Date   ABDOMINAL HYSTERECTOMY     BACK SURGERY     CARDIAC CATHETERIZATION     CORONARY/GRAFT ACUTE MI REVASCULARIZATION N/A 01/25/2020   Procedure: Coronary/Graft Acute MI Revascularization;  Surgeon: [name], MD;  Location: [facility] INVASIVE CV LAB;  Service: Cardiovascular;  Laterality: N/A;   FRACTURE SURGERY     IR CATHETER TUBE CHANGE  08/10/2021   LEFT HEART CATH AND CORONARY ANGIOGRAPHY N/A 01/25/2020   Procedure: LEFT HEART CATH AND CORONARY ANGIOGRAPHY;  Surgeon: [name], MD;  Location: [facility] INVASIVE CV LAB;  Service: Cardiovascular;  Laterality: N/A;   SKIN GRAFT Right    TOTAL ABDOMINAL HYSTERECTOMY Bilateral     Allergies[1]  Outpatient Encounter Medications as of 07/28/2024  Medication Sig   amoxicillin -clavulanate (AUGMENTIN ) 875-125 MG tablet Take 1 tablet by mouth 2 (two) times daily with a meal for 3 doses.   aspirin  EC 81 MG tablet Take 81 mg by mouth daily.   Cholecalciferol (VITAMIN D3) 1.25 MG (50000 UT) CAPS Take 1 capsule by mouth once a week.  every Mon for Supplement   Dextromethorphan-Benzocaine (COUGH/SORE THROAT LOZENGES MT) Use as directed 1 drop in the mouth or throat every 4 (four) hours as needed.   diazepam  (VALIUM ) 10 MG tablet Take 10 mg by mouth every 6 (six) hours as needed for anxiety.   doxycycline  (VIBRA -TABS) 100 MG tablet Take 1 tablet (100 mg total) by mouth 2 (two) times daily with a meal for 3 days.   feeding supplement (ENSURE PLUS HIGH PROTEIN) LIQD Take 237 mLs by mouth 3 (three) times daily.   fluticasone  (FLONASE ) 50 MCG/ACT nasal spray Place 1 spray into both nostrils daily as needed for allergies or rhinitis.   furosemide  (LASIX ) 40 MG tablet Take 1 tablet (40 mg total) by mouth 2 (two) times daily.   ibuprofen  (ADVIL ) 800 MG  tablet Take 800 mg by mouth 2 (two) times daily as needed for mild pain (pain score 1-3) or moderate pain (pain score 4-6).   ipratropium-albuterol  (DUONEB) 0.5-2.5 (3) MG/3ML SOLN Take 3 mLs by nebulization every 4 (four) hours as needed.   isosorbide  mononitrate (IMDUR ) 60 MG 24 hr tablet Take 60 mg by mouth daily.   lansoprazole (PREVACID) 15 MG capsule Take 15 mg by mouth 2 (two) times daily before a meal.   levothyroxine  (SYNTHROID ) 25 MCG tablet Take 25 mcg by mouth daily before breakfast.   magnesium  hydroxide (MILK OF MAGNESIA) 400 MG/5ML suspension Take 5 mLs by mouth every 6 (six) hours as needed for mild constipation.   Menthol (COUGH DROPS) 2.7 MG LOZG 1 tablet by Transmucosal route every 4 (four) hours as needed.   metoprolol  succinate (TOPROL -XL) 25 MG 24 hr tablet Take 0.5 tablets (12.5 mg total) by mouth daily.   mupirocin  ointment (BACTROBAN ) 2 % Place 1 Application into the nose 2 (two) times daily.   nitroGLYCERIN  (NITROSTAT ) 0.4 MG SL tablet Place 0.4 mg under  the tongue every 5 (five) minutes x 3 doses as needed for chest pain.    OLANZapine  (ZYPREXA ) 5 MG tablet Take 5 mg by mouth daily.   ondansetron  (ZOFRAN -ODT) 4 MG disintegrating tablet Take 4 mg by mouth every 4 (four) hours as needed for nausea or vomiting.   OXYGEN Inhale into the lungs. 2lpm   polyethylene glycol (MIRALAX  / GLYCOLAX ) 17 g packet Take 17 g by mouth daily.   potassium chloride  (KLOR-CON ) 20 MEQ packet Take 40 mEq by mouth daily.   promethazine  (PHENERGAN ) 12.5 MG suppository Place 1 suppository (12.5 mg total) rectally every 6 (six) hours as needed for refractory nausea / vomiting.   ranolazine  (RANEXA ) 500 MG 12 hr tablet Take 500 mg by mouth 2 (two) times daily.   rosuvastatin  (CRESTOR ) 20 MG tablet Take 1 tablet (20 mg total) by mouth daily.   senna-docusate (SENOKOT-S) 8.6-50 MG tablet Take 2 tablets by mouth at bedtime.   spironolactone  (ALDACTONE ) 25 MG tablet Take 0.5 tablets (12.5 mg total) by  mouth daily.   Vibegron  (GEMTESA ) 75 MG TABS Take 75 mg by mouth daily.   No facility-administered encounter medications on file as of 07/28/2024.    Review of Systems  Constitutional:  Negative for fatigue and fever.  HENT:  Negative for sore throat and trouble swallowing.   Eyes:  Positive for visual disturbance.  Respiratory:  Positive for cough. Negative for shortness of breath and wheezing.   Cardiovascular:  Negative for chest pain and leg swelling.  Gastrointestinal:  Negative for abdominal distention and abdominal pain.  Genitourinary:  Negative for hematuria.  Musculoskeletal:  Positive for gait problem.  Skin:  Negative for wound.  Neurological:  Positive for weakness. Negative for dizziness and headaches.  Psychiatric/Behavioral:  Positive for dysphoric mood. Negative for confusion and sleep disturbance. The patient is nervous/anxious.     Immunization History  Administered Date(s) Administered   Influenza-Unspecified 04/28/2011, 06/14/2012   Moderna Sars-Covid-2 Vaccination 09/08/2019, 10/06/2019, 06/11/2020   Tdap 09/03/2013, 11/15/2016   Zoster Recombinant(Shingrix) 12/08/2022, 03/10/2023   Pertinent  Health Maintenance Due  Topic Date Due   Colonoscopy  10/10/2024 (Originally 07/09/2006)   OPHTHALMOLOGY EXAM  10/22/2024 (Originally 07/10/1971)   Mammogram  11/29/2024   HEMOGLOBIN A1C  12/10/2024   FOOT EXAM  05/21/2025   Influenza Vaccine  Discontinued      05/28/2022    2:13 AM 12/07/2022    2:19 PM 12/27/2023   11:28 AM 05/25/2024    9:46 AM 06/10/2024   12:47 PM  Fall Risk  Falls in the past year?  Exclusion - non ambulatory 0 0 0  Was there an injury with Fall?    0  0   Fall Risk Category Calculator    0 0  (RETIRED) Patient Fall Risk Level Moderate fall risk       Patient at Risk for Falls Due to    History of fall(s) History of fall(s);Impaired balance/gait;Impaired mobility;Impaired vision  Fall risk Follow up    Falls evaluation completed Falls  evaluation completed     Data saved with a previous flowsheet row definition   Functional Status Survey:    Vitals:   07/28/24 0932  BP: 126/80  Pulse: 80  Resp: (!) 22  Temp: (!) 97.5 F (36.4 C)  SpO2: 98%  Weight: 164 lb 6.4 oz (74.6 kg)  Height: 5' 1 (1.549 m)   Body mass index is 31.06 kg/m. Physical Exam Vitals reviewed.  Constitutional:  General: She is not in acute distress.    Appearance: She is obese.  HENT:     Head: Normocephalic.  Eyes:     General:        Right eye: No discharge.        Left eye: No discharge.  Cardiovascular:     Rate and Rhythm: Normal rate and regular rhythm.     Pulses: Normal pulses.     Heart sounds: Normal heart sounds.  Pulmonary:     Effort: Pulmonary effort is normal. No respiratory distress.     Breath sounds: Rhonchi present. No wheezing or rales.  Abdominal:     General: Bowel sounds are normal. There is no distension.     Palpations: Abdomen is soft.     Tenderness: There is no abdominal tenderness.  Genitourinary:    Comments: Foley, UOP adequate Musculoskeletal:     Cervical back: Neck supple.     Right lower leg: No edema.     Left lower leg: No edema.  Skin:    General: Skin is warm.     Capillary Refill: Capillary refill takes less than 2 seconds.  Neurological:     General: No focal deficit present.     Mental Status: She is alert. Mental status is at baseline.     Gait: Gait abnormal.  Psychiatric:        Mood and Affect: Mood normal.     Labs reviewed: Recent Labs    06/05/24 1433 06/06/24 1240 07/12/24 0404 07/22/24 0358 07/22/24 0530 07/22/24 1726 07/23/24 0626 07/25/24 0817 07/25/24 1013  NA  --    < > 132* 134*  --   --   --   --  134*  K  --    < > 3.5 3.4*  --   --   --   --  3.2*  CL  --    < > 85* 92*  --   --   --   --  96*  CO2  --    < > 35* 24  --   --   --   --  25  GLUCOSE  --    < > 145* 292*  --   --   --   --  181*  BUN  --    < > 15 9  --   --   --   --  20   CREATININE  --    < > 0.88 1.13*  --    < > 0.85 0.92 0.93  CALCIUM   --    < > 8.9 9.4  --   --   --   --  9.0  MG 1.8  --   --   --  2.0  --   --   --   --    < > = values in this interval not displayed.   Recent Labs    07/07/24 0340 07/08/24 0447 07/22/24 0358  AST 17 13* 18  ALT 6 7 9   ALKPHOS 104 104 101  BILITOT 0.6 0.5 0.6  PROT 6.9 7.0 7.7  ALBUMIN 4.0 4.2 4.3   Recent Labs    06/12/24 0150 06/13/24 0522 07/07/24 0340 07/07/24 1020 07/22/24 0358 07/22/24 1726 07/25/24 1013  WBC 15.6*   < > 12.2*   < > 16.8* 10.7* 6.0  NEUTROABS 13.1*  --  10.2*  --  14.3*  --   --   HGB 11.6*   < >  12.7   < > 14.2 11.6* 11.8*  HCT 34.7*   < > 38.5   < > 45.3 36.3 36.8  MCV 83.2   < > 82.4   < > 85.5 84.8 84.2  PLT 323   < > 268   < > 551* 369 291   < > = values in this interval not displayed.   Lab Results  Component Value Date   TSH 0.976 07/08/2024   Lab Results  Component Value Date   HGBA1C 6.1 (H) 06/12/2024   Lab Results  Component Value Date   CHOL 196 06/05/2024   HDL 69 06/05/2024   LDLCALC 113 (H) 06/05/2024   TRIG 68 06/05/2024   CHOLHDL 2.8 06/05/2024    Significant Diagnostic Results in last 30 days:  DG Chest Port 1 View Result Date: 07/25/2024 CLINICAL DATA:  No Mon yeah. EXAM: PORTABLE CHEST 1 VIEW COMPARISON:  Radiograph and CT 07/22/2024 FINDINGS: The heart is upper normal in size. Stable mediastinal contours. Aortic atherosclerosis. There are coronary stents. Improvement in heterogeneous bilateral lung opacities. Mild patchy residual persists at the bases. Elevated right hemidiaphragm. No large pleural effusion. No visible pneumothorax. IMPRESSION: Improvement in heterogeneous bilateral lung opacities from prior exam. Mild patchy residual persists at the bases. Electronically Signed   By: Andrea Gasman M.D.   On: 07/25/2024 14:14   CT Angio Chest PE W and/or Wo Contrast Result Date: 07/22/2024 CLINICAL DATA:  Shortness of breath. EXAM: CT  ANGIOGRAPHY CHEST WITH CONTRAST TECHNIQUE: Multidetector CT imaging of the chest was performed using the standard protocol during bolus administration of intravenous contrast. Multiplanar CT image reconstructions and MIPs were obtained to evaluate the vascular anatomy. RADIATION DOSE REDUCTION: This exam was performed according to the departmental dose-optimization program which includes automated exposure control, adjustment of the mA and/or kV according to patient size and/or use of iterative reconstruction technique. CONTRAST:  75mL OMNIPAQUE  IOHEXOL  350 MG/ML SOLN COMPARISON:  January 24, 2020 FINDINGS: Cardiovascular: Satisfactory opacification of the pulmonary arteries to the segmental level. No evidence of pulmonary embolism. There is mild cardiomegaly with marked severity coronary artery calcification no pericardial effusion. Mediastinum/Nodes: No enlarged mediastinal, hilar, or axillary lymph nodes. Thyroid gland, trachea, and esophagus demonstrate no significant findings. Lungs/Pleura: Moderate to marked severity multifocal infiltrates are seen throughout both lungs. Mild compressive atelectasis is also noted within the posterior aspect of the bilateral lower lobes. There are small bilateral pleural effusions. No pneumothorax is identified. Upper Abdomen: Ill-defined gallstones are seen within the gallbladder lumen. Musculoskeletal: Postoperative changes are seen within the lower cervical spine. No acute osseous abnormalities are identified. Review of the MIP images confirms the above findings. IMPRESSION: 1. No evidence of pulmonary embolism. 2. Moderate to marked severity multifocal bilateral infiltrates. 3. Small bilateral pleural effusions. 4. Cholelithiasis. Electronically Signed   By: Suzen Dials M.D.   On: 07/22/2024 11:57   DG Chest Port 1 View Result Date: 07/22/2024 EXAM: 1 VIEW(S) XRAY OF THE CHEST 07/22/2024 04:21:00 AM COMPARISON: Portable chest, 07/07/2024. CLINICAL HISTORY:  Questionable sepsis - evaluate for abnormality. FINDINGS: LINES, TUBES AND DEVICES: Monitor wires are present. LUNGS AND PLEURA: Increased central vascular congestion and moderate diffuse interstitial edema with a basal gradient. Increased perihilar haziness which is probably ground glass edema. Small pleural effusions are forming. No pneumothorax. No other focal airspace process is seen. HEART AND MEDIASTINUM: Mild cardiomegaly. Calcification in the transverse aorta. Calcifications and stents in the LAD coronary artery. Unremarkable mediastinum. BONES AND SOFT TISSUES: Artifact from  overlying clothing. No acute osseous abnormality. Partially visible lower cervical spine fusion plating. IMPRESSION: 1. Increased central vascular congestion and moderate diffuse interstitial edema with basal gradient, with probable ground-glass edema centrally. 2. Small pleural effusions. 3. Mild cardiomegaly. Electronically signed by: Francis Quam MD 07/22/2024 04:55 AM EST RP Workstation: [id number]   DG Chest Port 1 View Result Date: 07/07/2024 EXAM: 1 VIEW(S) XRAY OF THE CHEST 07/07/2024 03:59:00 AM COMPARISON: 06/12/2024 CLINICAL HISTORY: shob cp FINDINGS: LUNGS AND PLEURA: Stable diffuse mild interstitial pulmonary edema. No pleural effusion. No pneumothorax. HEART AND MEDIASTINUM: Mild cardiomegaly. Aortic atherosclerosis. BONES AND SOFT TISSUES: No acute osseous abnormality. IMPRESSION: 1. Stable diffuse mild interstitial pulmonary edema. 2. Mild cardiomegaly. Electronically signed by: Dorethia Molt MD 07/07/2024 04:08 AM EST RP Workstation: [id number]    Assessment/Plan 1. Acute hypoxic respiratory failure (HCC) (Primary) - hospitalized 12/23-12/27 - secondary to PNA - off oxygen, sats > 95% RA - cont oxygen prn   2. Multifocal pneumonia - see above - improved with cefepime  and vanc - mild rhonchi on exam  - cont doxycycline  and Augmentin    3. Chronic combined systolic and diastolic heart failure (HCC) -  spirolactone reduced due to hypotension  - cont furosemide  40 mg po BID - cont daily weights  4. Hypokalemia - K+ 3.2 12/26 - cont KCL  5. History of stroke - cont asa and rosuvastatin   6. 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 Acquired hypothyroidism - TSH stable - cont levothyroxine   8. Major depressive disorder, recurrent, moderate (HCC) - mood stable - cont olanzapine   9. Indwelling Foley catheter present  10. Obesity, Class I, BMI 30-34.9 - BMI 31.06     Family/ staff Communication: plan discussed with patient and nurse  Labs/tests ordered:  bmp 12/31        [1]  Allergies Allergen Reactions   Levaquin [Levofloxacin In D5w] Anaphylaxis and Swelling   Iodinated Contrast Media Itching    Severe itching   Other     Dust mites and opiates (all of them)   Latex Dermatitis   "

## 2024-07-29 ENCOUNTER — Non-Acute Institutional Stay (SKILLED_NURSING_FACILITY): Payer: Self-pay | Admitting: Internal Medicine

## 2024-07-29 ENCOUNTER — Encounter: Payer: Self-pay | Admitting: Internal Medicine

## 2024-07-29 DIAGNOSIS — E66811 Obesity, class 1: Secondary | ICD-10-CM | POA: Diagnosis not present

## 2024-07-29 DIAGNOSIS — H47619 Cortical blindness, unspecified side of brain: Secondary | ICD-10-CM | POA: Diagnosis not present

## 2024-07-29 DIAGNOSIS — Z66 Do not resuscitate: Secondary | ICD-10-CM

## 2024-07-29 DIAGNOSIS — K219 Gastro-esophageal reflux disease without esophagitis: Secondary | ICD-10-CM

## 2024-07-29 DIAGNOSIS — F22 Delusional disorders: Secondary | ICD-10-CM | POA: Diagnosis not present

## 2024-07-29 DIAGNOSIS — Z9359 Other cystostomy status: Secondary | ICD-10-CM

## 2024-07-29 DIAGNOSIS — I69354 Hemiplegia and hemiparesis following cerebral infarction affecting left non-dominant side: Secondary | ICD-10-CM | POA: Diagnosis not present

## 2024-07-29 DIAGNOSIS — E039 Hypothyroidism, unspecified: Secondary | ICD-10-CM

## 2024-07-29 DIAGNOSIS — Z5181 Encounter for therapeutic drug level monitoring: Secondary | ICD-10-CM

## 2024-07-29 DIAGNOSIS — F411 Generalized anxiety disorder: Secondary | ICD-10-CM | POA: Diagnosis not present

## 2024-07-29 DIAGNOSIS — J9611 Chronic respiratory failure with hypoxia: Secondary | ICD-10-CM

## 2024-07-29 DIAGNOSIS — I10 Essential (primary) hypertension: Secondary | ICD-10-CM | POA: Diagnosis not present

## 2024-07-29 DIAGNOSIS — I5022 Chronic systolic (congestive) heart failure: Secondary | ICD-10-CM

## 2024-07-29 DIAGNOSIS — E119 Type 2 diabetes mellitus without complications: Secondary | ICD-10-CM

## 2024-07-29 MED ORDER — DIAZEPAM 10 MG PO TABS: 10.0000 mg | ORAL_TABLET | Freq: Four times a day (QID) | ORAL | 0 refills | Status: DC

## 2024-07-29 NOTE — Assessment & Plan Note (Signed)
 Chronic cortical blindness

## 2024-07-29 NOTE — Assessment & Plan Note (Addendum)
Patient is a DNR/DNI

## 2024-07-29 NOTE — Assessment & Plan Note (Signed)
 Remains on 2 L/min home oxygen now.

## 2024-07-29 NOTE — Assessment & Plan Note (Signed)
 07/29/2024 hospitalist documentation, patient's blood pressure is running low.  At the time of discharge, her losartan  and her Norvasc  were discontinued.  Her Toprol -XL dose was reduced to 12.5 mg daily.  Her Lasix  dose was increased to 40 mg twice daily.  Her spironolactone  dose was kept the same at 12.5 mg daily.  She remains on Imdur  60 mg daily.

## 2024-07-29 NOTE — Assessment & Plan Note (Signed)
 07/29/2024 continue with zyprexa  5 mg daily.    07/16/2024 patient was just released from the hospital on 07/12/2024. She remains on Zyprexa  5 mg daily. She is also on chronically on Valium  10 mg 4 times a day. Given her recent 3 hospitalizations in the last 2 months, we will readdress any further dose reductions or antipsychotics in January 2026.      started on Zyprexa  by Dr. Abdul on 2-12-204 for hallucinations at 2.5 mg twice daily. On January 31, 2023, her dose was increased to 5 mg twice daily due to the patient's increased hallucinations causing a fall out of bed. Dr. Abdul documents on her January 31, 2023 note that the patient had failed dose reductions in her Zyprexa . It appears around April 2025, her Zyprexa  dose was decreased to 5 mg once a day due to hyponatremia.   started on Zyprexa  by Dr. Abdul on 09-11-2022 for hallucinations at 2.5 mg twice daily.

## 2024-07-29 NOTE — Assessment & Plan Note (Signed)
 Patient has a chronic suprapubic catheter.  She is not a candidate for SGLT-2 inhibitors for CHF due to history of complicated UTIs

## 2024-07-29 NOTE — Assessment & Plan Note (Signed)
 Remains on Prevacid 50 mg twice daily for reflux without esophagitis

## 2024-07-29 NOTE — Progress Notes (Signed)
 [facility] Admission H&P  Provider: [name] Jasen Hartstein,DO Location:  Other [facility].  Nursing Home Room Number: [facility] [id number] Place of Service:  SNF (31)   PCP: Laurence [name], DO Patient Care Team: Laurence [name], DO as PCP - General (Internal Medicine) [name], MD as PCP - Cardiology (Cardiology)   Extended Emergency Contact Information Primary Emergency Contact: [facility]  [place] Home Phone: [phone] Mobile Phone: [phone] Relation: Sister Secondary Emergency Contact: [name] Address: [id number]          [place], [place] [address] [place] Home Phone: [phone] Relation: Mother   Goals of Care: Advanced Directive information    07/22/2024    3:58 AM  Advanced Directives  Does Patient Have a Medical Advance Directive? Yes  Type of Advance Directive Out of facility DNR (pink MOST or yellow form)  Does patient want to make changes to medical advance directive? No - Patient declined    CODE STATUS: Do Not Resuscitate (DNR)    Chief Complaint  Patient presents with   Admission    Admission.      HPI: Patient is a 63 y.o. female seen today for admission to [facility].  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  At the time of admission, she was diagnosed with right sided pneumonia, acute on chronic systolic heart failure with an EF of 20 to 25%.  She was treated with IV cefepime , vancomycin , IV Lasix , IV Solu-Medrol . While she was in the hospital, her Lasix  dose was increased to 40 mg twice daily.  She had only been getting 40  mg of Lasix  daily as this was the discharge dose of Lasix  when she was discharged from the hospitalist service on 07/12/2024.  Her Norvasc  and losartan  were discontinued due to low blood pressures.  Her Aldactone  was reduced to 12.5 mg daily.  Her spironolactone  was also decreased to 12.5 mg daily.  Patient remains on 2 L of oxygen.  She states that she is breathing much better now.  She has no edema in her legs.  She remains on a 1200 cc fluid restriction.  This is a comprehensive admission note to this SNF performed on this date less than 30 days from date of admission. Included are preadmission medical/surgical history; reconciled medication list; family history; social history and comprehensive review of systems.  Corrections and additions to the records were documented. Comprehensive physical exam was also performed. Additionally a clinical summary was entered for each active diagnosis pertinent to this admission in the Problem List to enhance continuity of care.   Past Medical History:  Diagnosis Date   Acute ST elevation myocardial infarction (STEMI) of inferolateral wall (HCC) 01/25/2020   Blind    Cervical intraepithelial glandular neoplasia 02/07/2011   Cholecystitis 04/22/2021   DVT (deep venous thrombosis) (HCC) 04/03/2012   History of migraine headaches    History of pulmonary embolism 04/02/2012   September 2013 after knee injury- hyperextension- . U/S of lower extremity revealed DVT in L leg. CTA revealed bilateral lower lobe segmental PEs and R upper lobe segmental PE. Patient was started on lovenox  and transferred upstairs. Cardiac enzymes were negative x1. NOAC for  7 months.         History of stroke 10/09/2021   Hyperlipemia    Myocardial infarction [facility])    NSTEMI (non-ST elevated myocardial infarction) (HCC) 10/27/2013   Formatting of this note might be different from the original.  01/08/2014: LHC: 2nd Marginal: 99% (pre) to 0% (post); STENT, PROMUS PREMIER MR 2.25X12MM      12/04/2013: LHC: Distal LAD: 70% (pre) to 0% (post) CATH, MINI TREK RX 2.0X20MM STENT, PROMUS PREMIER MR 2.25X28MM; Mid LAD: 70% (pre) to 0% (post), STENT, PROMUS PREMIER MR 2.50X12MM     10/27/2013. LHC: 3 vessel Disease, Stent x2:  Proximal LAD:   Stroke [facility])    Past Surgical History:  Procedure Laterality Date   ABDOMINAL HYSTERECTOMY     BACK SURGERY     CARDIAC CATHETERIZATION     CORONARY/GRAFT ACUTE MI REVASCULARIZATION N/A 01/25/2020   Procedure: Coronary/Graft Acute MI Revascularization;  Surgeon: [name], MD;  Location: [facility] INVASIVE CV LAB;  Service: Cardiovascular;  Laterality: N/A;   FRACTURE SURGERY     IR CATHETER TUBE CHANGE  08/10/2021   LEFT HEART CATH AND CORONARY ANGIOGRAPHY N/A 01/25/2020   Procedure: LEFT HEART CATH AND CORONARY ANGIOGRAPHY;  Surgeon: [name], MD;  Location: [facility] INVASIVE CV LAB;  Service: Cardiovascular;  Laterality: N/A;   SKIN GRAFT Right    TOTAL ABDOMINAL HYSTERECTOMY Bilateral     reports that she has never smoked. She has never used smokeless tobacco. She reports current alcohol use. She reports that she does not use drugs. Social History   Socioeconomic History   Marital status: Single    Spouse name: Not on file   Number of children: Not on file   Years of education: Not on file   Highest education level: Not on file  Occupational History   Not on file  Tobacco Use   Smoking status: Never   Smokeless tobacco: Never  Vaping Use   Vaping status: Never Used  Substance and Sexual Activity   Alcohol use: Yes    Comment: occasional   Drug use: No   Sexual activity: Not Currently  Other Topics Concern   Not on file  Social History Narrative   Not on file   Social Drivers of Health   Tobacco Use: Low Risk (07/29/2024)   Patient History    Smoking Tobacco Use: Never    Smokeless Tobacco Use: Never    Passive Exposure: Not on file  Financial Resource Strain: Not on file  Food Insecurity: No Food Insecurity  (07/23/2024)   Epic    Worried About Programme Researcher, Broadcasting/film/video in the Last Year: Never true    Ran Out of Food in the Last Year: Never true  Transportation Needs: No Transportation Needs (07/23/2024)   Epic    Lack of Transportation (Medical): No    Lack of Transportation (Non-Medical): No  Physical Activity: Not on file  Stress: Not on file  Social Connections: Socially Isolated (07/09/2024)   Social Connection and Isolation Panel    Frequency of Communication with Friends and Family: Twice a week    Frequency of Social Gatherings with Friends and Family: Never    Attends Religious Services: Never    Database Administrator or Organizations: No    Attends Banker Meetings: Never    Marital Status: Never married  Intimate Partner Violence: Not At Risk (07/23/2024)   Epic    Fear of Current or Ex-Partner: No  Emotionally Abused: No    Physically Abused: No    Sexually Abused: No  Depression (PHQ2-9): Low Risk (07/28/2024)   Depression (PHQ2-9)    PHQ-2 Score: 0  Alcohol Screen: Not on file  Housing: Low Risk (07/23/2024)   Epic    Unable to Pay for Housing in the Last Year: No    Number of Times Moved in the Last Year: 0    Homeless in the Last Year: No  Utilities: Not At Risk (07/23/2024)   Epic    Threatened with loss of utilities: No  Health Literacy: Not on file     Functional Status Survey:     Family History  Problem Relation Age of Onset   Heart attack Mother    Hypertension Mother    Hypercholesterolemia Mother    Diabetes Mother    Stroke Father    Heart attack Father    Hypertension Father    Hypercholesterolemia Father    Diabetes Father    Diabetes Maternal Grandmother    Cancer - Other Maternal Grandmother      Health Maintenance  Topic Date Due   Pneumococcal Vaccine: 50+ Years (1 of 2 - PCV) Never done   Diabetic kidney evaluation - Urine ACR  04/16/2024   Colonoscopy  10/10/2024 (Originally 07/09/2006)   OPHTHALMOLOGY EXAM   10/22/2024 (Originally 07/10/1971)   Mammogram  11/29/2024   HEMOGLOBIN A1C  12/10/2024   Medicare Annual Wellness (AWV)  12/26/2024   FOOT EXAM  05/21/2025   Diabetic kidney evaluation - eGFR measurement  07/28/2025   DTaP/Tdap/Td (3 - Td or Tdap) 11/16/2026   Hepatitis C Screening  Completed   HIV Screening  Completed   Zoster Vaccines- Shingrix  Completed   Hepatitis B Vaccines 19-59 Average Risk  Aged Out   HPV VACCINES  Aged Out   Meningococcal B Vaccine  Aged Out   Influenza Vaccine  Discontinued   COVID-19 Vaccine  Discontinued     Allergies[1]   Outpatient Encounter Medications as of 07/29/2024  Medication Sig   amoxicillin -clavulanate (AUGMENTIN ) 875-125 MG tablet Take 1 tablet by mouth 2 (two) times daily.   aspirin  EC 81 MG tablet Take 81 mg by mouth daily.   Cholecalciferol (VITAMIN D3) 1.25 MG (50000 UT) CAPS Take 1 capsule by mouth once a week.  every Mon for Supplement   Dextromethorphan-Benzocaine (COUGH/SORE THROAT LOZENGES MT) Use as directed 1 drop in the mouth or throat every 4 (four) hours as needed.   diazepam  (VALIUM ) 10 MG tablet Take 10 mg by mouth every 6 (six) hours as needed for anxiety.   diazepam  (VALIUM ) 10 MG tablet Take 1 tablet (10 mg total) by mouth in the morning, at noon, in the evening, and at bedtime.   doxycycline  (VIBRA -TABS) 100 MG tablet Take 1 tablet (100 mg total) by mouth 2 (two) times daily with a meal for 3 days.   feeding supplement (ENSURE PLUS HIGH PROTEIN) LIQD Take 237 mLs by mouth 3 (three) times daily.   fluticasone  (FLONASE ) 50 MCG/ACT nasal spray Place 1 spray into both nostrils daily as needed for allergies or rhinitis.   furosemide  (LASIX ) 40 MG tablet Take 1 tablet (40 mg total) by mouth 2 (two) times daily.   ibuprofen  (ADVIL ) 800 MG tablet Take 800 mg by mouth 2 (two) times daily as needed for mild pain (pain score 1-3) or moderate pain (pain score 4-6).   ipratropium-albuterol  (DUONEB) 0.5-2.5 (3) MG/3ML SOLN Take 3 mLs by  nebulization every 4 (  four) hours as needed.   isosorbide  mononitrate (IMDUR ) 60 MG 24 hr tablet Take 60 mg by mouth daily.   lansoprazole (PREVACID) 15 MG capsule Take 15 mg by mouth 2 (two) times daily before a meal.   levothyroxine  (SYNTHROID ) 25 MCG tablet Take 25 mcg by mouth daily before breakfast.   magnesium  hydroxide (MILK OF MAGNESIA) 400 MG/5ML suspension Take 5 mLs by mouth every 6 (six) hours as needed for mild constipation.   Menthol (COUGH DROPS) 2.7 MG LOZG 1 tablet by Transmucosal route every 4 (four) hours as needed.   metoprolol  succinate (TOPROL -XL) 25 MG 24 hr tablet Take 0.5 tablets (12.5 mg total) by mouth daily.   mupirocin  ointment (BACTROBAN ) 2 % Place 1 Application into the nose 2 (two) times daily.   nitroGLYCERIN  (NITROSTAT ) 0.4 MG SL tablet Place 0.4 mg under the tongue every 5 (five) minutes x 3 doses as needed for chest pain.    OLANZapine  (ZYPREXA ) 5 MG tablet Take 5 mg by mouth daily.   ondansetron  (ZOFRAN -ODT) 4 MG disintegrating tablet Take 4 mg by mouth every 4 (four) hours as needed for nausea or vomiting.   OXYGEN Inhale into the lungs. 2lpm   polyethylene glycol (MIRALAX  / GLYCOLAX ) 17 g packet Take 17 g by mouth daily.   potassium chloride  (KLOR-CON ) 20 MEQ packet Take 40 mEq by mouth daily.   promethazine  (PHENERGAN ) 12.5 MG suppository Place 1 suppository (12.5 mg total) rectally every 6 (six) hours as needed for refractory nausea / vomiting.   ranolazine  (RANEXA ) 500 MG 12 hr tablet Take 500 mg by mouth 2 (two) times daily.   rosuvastatin  (CRESTOR ) 20 MG tablet Take 1 tablet (20 mg total) by mouth daily.   senna-docusate (SENOKOT-S) 8.6-50 MG tablet Take 2 tablets by mouth at bedtime.   spironolactone  (ALDACTONE ) 25 MG tablet Take 0.5 tablets (12.5 mg total) by mouth daily.   Vibegron  (GEMTESA ) 75 MG TABS Take 75 mg by mouth daily.   No facility-administered encounter medications on file as of 07/29/2024.     Review of Systems  Constitutional:  Negative.   HENT: Negative.    Eyes: Negative.   Respiratory: Negative.    Cardiovascular: Negative.   Gastrointestinal: Negative.   Endocrine: Negative.   Genitourinary: Negative.   Musculoskeletal: Negative.   Allergic/Immunologic: Negative.   Neurological: Negative.   Hematological: Negative.   Psychiatric/Behavioral: Negative.    All other systems reviewed and are negative.    Vitals:   07/29/24 0828  BP: 112/79  Pulse: 95  Resp: 20  Temp: (!) 96.8 F (36 C)  SpO2: 93%  Weight: 164 lb 6.4 oz (74.6 kg)  Height: 5' 1 (1.549 m)   Body mass index is 31.06 kg/m. Physical Exam Vitals and nursing note reviewed.  Constitutional:      General: She is not in acute distress.    Appearance: She is not toxic-appearing.  HENT:     Head: Normocephalic and atraumatic.     Nose: Nose normal.  Eyes:     Comments: Blind to light.  Cardiovascular:     Rate and Rhythm: Normal rate.  Pulmonary:     Effort: Pulmonary effort is normal.     Breath sounds: Normal breath sounds.  Abdominal:     General: Bowel sounds are normal.     Palpations: Abdomen is soft.  Genitourinary:    Comments: Chronic suprapubic tube Musculoskeletal:     Right lower leg: No edema.     Left lower leg: No  edema.     Comments: Flexion contracture at the left wrist.  Patient able to flex her elbow, able to flex/extend at her shoulder.  Skin:    General: Skin is warm and dry.     Capillary Refill: Capillary refill takes less than 2 seconds.  Neurological:     Mental Status: She is alert.      Labs reviewed: Basic Metabolic Panel: Recent Labs    06/05/24 1433 06/06/24 1240 07/12/24 0404 07/22/24 0358 07/22/24 0530 07/22/24 1726 07/25/24 0817 07/25/24 1013 07/28/24 0000  NA  --    < > 132* 134*  --   --   --  134* 136*  K  --    < > 3.5 3.4*  --   --   --  3.2* 4.0  CL  --    < > 85* 92*  --   --   --  96* 96*  CO2  --    < > 35* 24  --   --   --  25 27*  GLUCOSE  --    < > 145* 292*  --    --   --  181*  --   BUN  --    < > 15 9  --   --   --  20 24*  CREATININE  --    < > 0.88 1.13*  --    < > 0.92 0.93 1.0  CALCIUM   --    < > 8.9 9.4  --   --   --  9.0 9.0  MG 1.8  --   --   --  2.0  --   --   --   --    < > = values in this interval not displayed.   Liver Function Tests: Recent Labs    07/07/24 0340 07/08/24 0447 07/22/24 0358  AST 17 13* 18  ALT 6 7 9   ALKPHOS 104 104 101  BILITOT 0.6 0.5 0.6  PROT 6.9 7.0 7.7  ALBUMIN 4.0 4.2 4.3   Recent Labs    11/11/23 1336 07/07/24 0340 07/22/24 0358  LIPASE 36 24 33    CBC: Recent Labs    06/12/24 0150 06/13/24 0522 07/07/24 0340 07/07/24 1020 07/22/24 0358 07/22/24 1726 07/25/24 1013  WBC 15.6*   < > 12.2*   < > 16.8* 10.7* 6.0  NEUTROABS 13.1*  --  10.2*  --  14.3*  --   --   HGB 11.6*   < > 12.7   < > 14.2 11.6* 11.8*  HCT 34.7*   < > 38.5   < > 45.3 36.3 36.8  MCV 83.2   < > 82.4   < > 85.5 84.8 84.2  PLT 323   < > 268   < > 551* 369 291   < > = values in this interval not displayed.      Lab Results  Component Value Date   HGBA1C 6.1 (H) 06/12/2024   Lab Results  Component Value Date   TSH 0.976 07/08/2024   Lab Results  Component Value Date   IRON 25 11/08/2023   TIBC 398 11/08/2023   FERRITIN 12 11/08/2023     Imaging and Procedures obtained prior to SNF admission: CT Angio Chest PE W and/or Wo Contrast Result Date: 07/22/2024 CLINICAL DATA:  Shortness of breath. EXAM: CT ANGIOGRAPHY CHEST WITH CONTRAST TECHNIQUE: Multidetector CT imaging of the chest was performed using the standard protocol during bolus  administration of intravenous contrast. Multiplanar CT image reconstructions and MIPs were obtained to evaluate the vascular anatomy. RADIATION DOSE REDUCTION: This exam was performed according to the departmental dose-optimization program which includes automated exposure control, adjustment of the mA and/or kV according to patient size and/or use of iterative reconstruction  technique. CONTRAST:  75mL OMNIPAQUE  IOHEXOL  350 MG/ML SOLN COMPARISON:  January 24, 2020 FINDINGS: Cardiovascular: Satisfactory opacification of the pulmonary arteries to the segmental level. No evidence of pulmonary embolism. There is mild cardiomegaly with marked severity coronary artery calcification no pericardial effusion. Mediastinum/Nodes: No enlarged mediastinal, hilar, or axillary lymph nodes. Thyroid gland, trachea, and esophagus demonstrate no significant findings. Lungs/Pleura: Moderate to marked severity multifocal infiltrates are seen throughout both lungs. Mild compressive atelectasis is also noted within the posterior aspect of the bilateral lower lobes. There are small bilateral pleural effusions. No pneumothorax is identified. Upper Abdomen: Ill-defined gallstones are seen within the gallbladder lumen. Musculoskeletal: Postoperative changes are seen within the lower cervical spine. No acute osseous abnormalities are identified. Review of the MIP images confirms the above findings. IMPRESSION: 1. No evidence of pulmonary embolism. 2. Moderate to marked severity multifocal bilateral infiltrates. 3. Small bilateral pleural effusions. 4. Cholelithiasis. Electronically Signed   By: Suzen Dials M.D.   On: 07/22/2024 11:57   DG Chest Port 1 View Result Date: 07/22/2024 EXAM: 1 VIEW(S) XRAY OF THE CHEST 07/22/2024 04:21:00 AM COMPARISON: Portable chest, 07/07/2024. CLINICAL HISTORY: Questionable sepsis - evaluate for abnormality. FINDINGS: LINES, TUBES AND DEVICES: Monitor wires are present. LUNGS AND PLEURA: Increased central vascular congestion and moderate diffuse interstitial edema with a basal gradient. Increased perihilar haziness which is probably ground glass edema. Small pleural effusions are forming. No pneumothorax. No other focal airspace process is seen. HEART AND MEDIASTINUM: Mild cardiomegaly. Calcification in the transverse aorta. Calcifications and stents in the LAD coronary  artery. Unremarkable mediastinum. BONES AND SOFT TISSUES: Artifact from overlying clothing. No acute osseous abnormality. Partially visible lower cervical spine fusion plating. IMPRESSION: 1. Increased central vascular congestion and moderate diffuse interstitial edema with basal gradient, with probable ground-glass edema centrally. 2. Small pleural effusions. 3. Mild cardiomegaly. Electronically signed by: Francis Quam MD 07/22/2024 04:55 AM EST RP Workstation: [id number]     Assessment/Plan Chronic systolic CHF (congestive heart failure) (HCC) - LVEF 20-25% 07/29/2024 she was diuresed with additional Lasix  during this hospital admission.  She was discharged on 40 mg of Lasix  twice daily.  Her spironolactone  was continued at 12.5 mg daily.  Her losartan  was discontinued along with her Norvasc  due to low blood pressures.  She remains on Imdur  60 mg daily.  Her Toprol -XL dose was decreased to 12.5 mg daily.   07-16-2024. December 2025 hospital admission for acute on chronic systolic heart failure and severe hyponatremia felt to be due to polydipsia from the patient eating ice and drinking water. For CHF/guideline directed medical therapy for chronic systolic heart failure remains Aldactone  12.5 mg daily, Lasix  40 mg daily, Imdur  60 mg daily, losartan  100 mg daily, Toprol -XL 25 mg daily. She is not a candidate for Doreen due to history of UTIs and a chronic suprapubic tube. She now is on a 1200 cc/day fluid restriction. Most recent chemistry from July 12, 2024 showed a serum sodium of 132, potassium 3.5, BUN of 15, creatinine 0.88, serum bicarbonate 35   06/17/24 during her November 2025 hospital admission, cardiology stated that patient is intolerant to beta-blockade. Continue with ARB, Lasix , Aldactone . Patient has a chronic Foley. Do not think that  Farxiga or other SGLT-2 inhibitors are appropriate given her history of complicated UTIs. Continue with Lasix  40 mg daily, Aldactone  12.5 mg daily,  losartan  100 mg daily.    Encounter for medication titration 07/29/2024 she had yet another hospitalization on 07/22/2024.  She remains on Zyprexa  5 mg once a day.  She is chronically on Valium  10 mg.  Changed this back to scheduled 4 times a day instead of as needed.  Due to multiple recent hospitalizations, she will need to settle back down into her normal routine before any consideration given for gradual dose reduction(GDR)  07/16/2024 patient was just released from the hospital on 07/12/2024. She remains on Zyprexa  5 mg daily. She is also on chronically on Valium  10 mg 4 times a day. Given her recent 3 hospitalizations in the last 2 months, we will readdress any further dose reductions or antipsychotics in January 2026.   06/17/24 started on Zyprexa  by Dr. Abdul on 2-12-204 for hallucinations at 2.5 mg twice daily.  On January 31, 2023, her dose was increased to 5 mg twice daily due to the patient's increased hallucinations causing a fall out of bed.  Dr. Abdul documents on her January 31, 2023 note that the patient had failed dose reductions in her Zyprexa .  It appears around April 2025, her Zyprexa  dose was decreased to 5 mg once a day due to hyponatremia.  Given her recent 2 hospitalizations in the month November, we will give her a few weeks to settle back into her routine and consider dose reduction again in December 2025.   GAD (generalized anxiety disorder) 07/29/2024 change valium  back to 10 mg 4 times a day(scheduled). She has been on this long-term. Patient has a long history of anxiety. Attempts at weaning have been unsuccessful in the past. Attempts at weaning benzodiazepines causes the patient extreme anxiety and should not be reattempted.   Delusional disorder (HCC) 07/29/2024 continue with zyprexa  5 mg daily.    07/16/2024 patient was just released from the hospital on 07/12/2024. She remains on Zyprexa  5 mg daily. She is also on chronically on Valium  10 mg 4 times a day. Given her  recent 3 hospitalizations in the last 2 months, we will readdress any further dose reductions or antipsychotics in January 2026.      started on Zyprexa  by Dr. Abdul on 2-12-204 for hallucinations at 2.5 mg twice daily. On January 31, 2023, her dose was increased to 5 mg twice daily due to the patient's increased hallucinations causing a fall out of bed. Dr. Abdul documents on her January 31, 2023 note that the patient had failed dose reductions in her Zyprexa . It appears around April 2025, her Zyprexa  dose was decreased to 5 mg once a day due to hyponatremia.   started on Zyprexa  by Dr. Abdul on 09-11-2022 for hallucinations at 2.5 mg twice daily.     Essential hypertension 07/29/2024 hospitalist documentation, patient's blood pressure is running low.  At the time of discharge, her losartan  and her Norvasc  were discontinued.  Her Toprol -XL dose was reduced to 12.5 mg daily.  Her Lasix  dose was increased to 40 mg twice daily.  Her spironolactone  dose was kept the same at 12.5 mg daily.  She remains on Imdur  60 mg daily.  Cortical blindness Chronic cortical blindness  Acquired hypothyroidism Remains on Synthroid  25 mcg daily for acquired hypothyroidism  GERD without esophagitis Remains on Prevacid 50 mg twice daily for reflux without esophagitis  Obesity, Class I, BMI 30-34.9 Body mass index  is 31.06 kg/m.   Suprapubic catheter [facility]) Patient has a chronic suprapubic catheter.  She is not a candidate for SGLT-2 inhibitors for CHF due to history of complicated UTIs  DNR (do not resuscitate) Patient is a DNR/DNI    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) Chronic.  She has chronic left wrist contractures.  She is however able to flex and extend at the left elbow, flex and extend at the left shoulder  Controlled type 2 diabetes mellitus (HCC) Stable.  Hemoglobin A1c was 6.1% in November 2025 she is diet controlled.  Not on any insulin .  Not  on any hypoglycemic agents.  Chronic hypoxic respiratory failure, on home oxygen therapy (HCC) - 2 L/min Remains on 2 L/min home oxygen now.    Family/ staff Communication: discussed with nursing   Labs/tests ordered: BMP ordered 07/30/2024 Meds ordered this encounter  Medications   diazepam  (VALIUM ) 10 MG tablet    Sig: Take 1 tablet (10 mg total) by mouth in the morning, at noon, in the evening, and at bedtime.    Dispense:  120 tablet    Refill:  0   There are no discontinued medications. Orders Placed This Encounter  Procedures   Basic metabolic panel with GFR    This external order was created through the Results Console.   Comprehensive metabolic panel with GFR    This external order was created through the Results Console.    [name] Door, DO [facility] [phone]      [1]  Allergies Allergen Reactions   Levaquin [Levofloxacin In D5w] Anaphylaxis and Swelling   Iodinated Contrast Media Itching    Severe itching   Other     Dust mites and opiates (all of them)   Latex Dermatitis

## 2024-07-29 NOTE — Assessment & Plan Note (Addendum)
 07/29/2024 she was diuresed with additional Lasix  during this hospital admission.  She was discharged on 40 mg of Lasix  twice daily.  Her spironolactone  was continued at 12.5 mg daily.  Her losartan  was discontinued along with her Norvasc  due to low blood pressures.  She remains on Imdur  60 mg daily.  Her Toprol -XL dose was decreased to 12.5 mg daily.   07-16-2024. December 2025 hospital admission for acute on chronic systolic heart failure and severe hyponatremia felt to be due to polydipsia from the patient eating ice and drinking water. For CHF/guideline directed medical therapy for chronic systolic heart failure remains Aldactone  12.5 mg daily, Lasix  40 mg daily, Imdur  60 mg daily, losartan  100 mg daily, Toprol -XL 25 mg daily. She is not a candidate for Doreen due to history of UTIs and a chronic suprapubic tube. She now is on a 1200 cc/day fluid restriction. Most recent chemistry from July 12, 2024 showed a serum sodium of 132, potassium 3.5, BUN of 15, creatinine 0.88, serum bicarbonate 35   06/17/24 during her November 2025 hospital admission, cardiology stated that patient is intolerant to beta-blockade. Continue with ARB, Lasix , Aldactone . Patient has a chronic Foley. Do not think that Farxiga or other SGLT-2 inhibitors are appropriate given her history of complicated UTIs. Continue with Lasix  40 mg daily, Aldactone  12.5 mg daily, losartan  100 mg daily.

## 2024-07-29 NOTE — Assessment & Plan Note (Signed)
 Stable.  Hemoglobin A1c was 6.1% in November 2025 she is diet controlled.  Not on any insulin .  Not on any hypoglycemic agents.

## 2024-07-29 NOTE — Assessment & Plan Note (Signed)
 07/29/2024 change valium  back to 10 mg 4 times a day(scheduled). She has been on this long-term. Patient has a long history of anxiety. Attempts at weaning have been unsuccessful in the past. Attempts at weaning benzodiazepines causes the patient extreme anxiety and should not be reattempted.

## 2024-07-29 NOTE — Assessment & Plan Note (Addendum)
 07/29/2024 she had yet another hospitalization on 07/22/2024.  She remains on Zyprexa  5 mg once a day.  She is chronically on Valium  10 mg.  Changed this back to scheduled 4 times a day instead of as needed.  Due to multiple recent hospitalizations, she will need to settle back down into her normal routine before any consideration given for gradual dose reduction(GDR)  07/16/2024 patient was just released from the hospital on 07/12/2024. She remains on Zyprexa  5 mg daily. She is also on chronically on Valium  10 mg 4 times a day. Given her recent 3 hospitalizations in the last 2 months, we will readdress any further dose reductions or antipsychotics in January 2026.   06/17/24 started on Zyprexa  by Dr. Abdul on 2-12-204 for hallucinations at 2.5 mg twice daily.  On January 31, 2023, her dose was increased to 5 mg twice daily due to the patient's increased hallucinations causing a fall out of bed.  Dr. Abdul documents on her January 31, 2023 note that the patient had failed dose reductions in her Zyprexa .  It appears around April 2025, her Zyprexa  dose was decreased to 5 mg once a day due to hyponatremia.  Given her recent 2 hospitalizations in the month November, we will give her a few weeks to settle back into her routine and consider dose reduction again in December 2025.

## 2024-07-29 NOTE — Assessment & Plan Note (Signed)
Body mass index is 31.06 kg/m.

## 2024-07-29 NOTE — Assessment & Plan Note (Signed)
 Chronic.  She has chronic left wrist contractures.  She is however able to flex and extend at the left elbow, flex and extend at the left shoulder

## 2024-07-29 NOTE — Assessment & Plan Note (Signed)
 Remains on Synthroid  25 mcg daily for acquired hypothyroidism

## 2024-08-01 LAB — CULTURE, BLOOD (ROUTINE X 2): Special Requests: ADEQUATE

## 2024-08-01 LAB — BASIC METABOLIC PANEL WITH GFR
BUN: 14 (ref 4–21)
CO2: 33 — AB (ref 13–22)
Chloride: 97 — AB (ref 99–108)
Creatinine: 1.1 (ref 0.5–1.1)
Glucose: 158
Potassium: 4.2 meq/L (ref 3.5–5.1)
Sodium: 138 (ref 137–147)

## 2024-08-01 LAB — COMPREHENSIVE METABOLIC PANEL WITH GFR
Calcium: 9.3 (ref 8.7–10.7)
eGFR: 59

## 2024-08-07 LAB — BASIC METABOLIC PANEL WITH GFR
BUN: 10 (ref 4–21)
CO2: 31 — AB (ref 13–22)
Chloride: 98 — AB (ref 99–108)
Creatinine: 1.1 (ref 0.5–1.1)
Glucose: 134
Potassium: 4 meq/L (ref 3.5–5.1)
Sodium: 140 (ref 137–147)

## 2024-08-07 LAB — COMPREHENSIVE METABOLIC PANEL WITH GFR
Calcium: 8.9 (ref 8.7–10.7)
eGFR: 60

## 2024-08-14 ENCOUNTER — Ambulatory Visit: Admitting: Physician Assistant

## 2024-08-14 DIAGNOSIS — Z435 Encounter for attention to cystostomy: Secondary | ICD-10-CM | POA: Diagnosis not present

## 2024-08-14 LAB — BASIC METABOLIC PANEL WITH GFR
BUN: 12 (ref 4–21)
CO2: 28 — AB (ref 13–22)
Chloride: 101 (ref 99–108)
Creatinine: 1.1 (ref 0.5–1.1)
Glucose: 137
Potassium: 4.1 meq/L (ref 3.5–5.1)
Sodium: 140 (ref 137–147)

## 2024-08-14 LAB — COMPREHENSIVE METABOLIC PANEL WITH GFR
Calcium: 8.7 (ref 8.7–10.7)
eGFR: 57

## 2024-08-14 NOTE — Progress Notes (Signed)
 Suprapubic Cath Change  Patient is present today for a suprapubic catheter change due to urinary retention.  8ml of water was drained from the balloon, a 18FR Silastic foley cath was removed from the tract without difficulty.  Site was cleaned and prepped in a sterile fashion with betadine.  A 18FR Silastic foley cath was replaced into the tract no complications were noted. Urine return was not noted, 10 ml of sterile water was inflated into the balloon and a night bag was attached for drainage.  Patient tolerated well.     Performed by: Merin Borjon, PA-C and Gabrielle Aiden, PA-C  Follow up: Return in about 4 weeks (around 09/11/2024) for SPT exchange.

## 2024-08-18 ENCOUNTER — Encounter: Payer: Self-pay | Admitting: Orthopedic Surgery

## 2024-08-18 ENCOUNTER — Telehealth: Payer: Self-pay

## 2024-08-18 ENCOUNTER — Non-Acute Institutional Stay (SKILLED_NURSING_FACILITY): Payer: Self-pay | Admitting: Orthopedic Surgery

## 2024-08-18 DIAGNOSIS — F411 Generalized anxiety disorder: Secondary | ICD-10-CM | POA: Diagnosis not present

## 2024-08-18 DIAGNOSIS — I5022 Chronic systolic (congestive) heart failure: Secondary | ICD-10-CM

## 2024-08-18 DIAGNOSIS — Z978 Presence of other specified devices: Secondary | ICD-10-CM | POA: Diagnosis not present

## 2024-08-18 DIAGNOSIS — F22 Delusional disorders: Secondary | ICD-10-CM | POA: Diagnosis not present

## 2024-08-18 NOTE — Progress Notes (Signed)
 " Location:  Other [facility].  Nursing Home Room Number: [facility] [id number] Place of Service:  SNF [phone]) Provider:  Greig Cluster, NP  PCP: [name], DO  Patient Care Team: [name], DO as PCP - General (Internal Medicine) [name], MD as PCP - Cardiology (Cardiology)  Extended Emergency Contact Information Primary Emergency Contact: Lemus (POA),[name]  [place] Home Phone: [phone] Mobile Phone: [phone] Relation: Sister Secondary Emergency Contact: [name] Address: [id number]          [place], [place] [address] [place] Home Phone: [phone] Relation: Mother  Code Status:  DNR Goals of care: Advanced Directive information    08/18/2024   10:09 AM  Advanced Directives  Does Patient Have a Medical Advance Directive? Yes  Type of Advance Directive Out of facility DNR (pink MOST or yellow form)  Does patient want to make changes to medical advance directive? No - Patient declined     Chief Complaint  Patient presents with   Bladder Spasms.     Bladder Spasms.     HPI:  Pt is a 64 y.o. female seen today for acute visit due to bladder spasms.   She currently resides on the skilled nursing unit at [facility]. PMH: NSTEMI, CAD, DVT, PE, HTN, HFrEF, migraines, OSA, GERD, hypothyroidism, stroke with hemiplegia, myalgias, cervical intraepithelial glandular neoplasia, obesity, suprapubic catheter, anxiety and psychosis.   H/o suprapubic foley due to retention. Nursing reports increased leakage around foley insertion site x 2 days. She was recently seen by urology last week for monthly foley exchange. Low UOP per chart review. She c/o bladder pain and spasms throughout weekend. Bladder tenderness on exam. Foley balloon was deflated and advanced. Urine was noted to be foley tubing after this was done. She reported instant relief to bladder tenderness. Urine was clear and yellow. Afebrile. Vitals stable.     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 U/S of lower extremity revealed DVT in L leg. CTA revealed bilateral lower lobe segmental PEs and R upper lobe segmental PE. Patient was started on lovenox  and transferred upstairs. Cardiac enzymes were negative x1. NOAC for 7 months.         History of stroke 10/09/2021   Hyperlipemia    Myocardial infarction [facility])    NSTEMI (non-ST elevated myocardial infarction) (HCC) 10/27/2013   Formatting of this note might be different from the original.  01/08/2014: LHC: 2nd Marginal: 99% (pre) to 0% (post); STENT, PROMUS PREMIER MR 2.25X12MM     12/04/2013: LHC: Distal LAD: 70% (pre) to 0% (post) CATH, MINI TREK RX 2.0X20MM STENT, PROMUS PREMIER MR 2.25X28MM; Mid LAD: 70% (pre) to 0% (post), STENT, PROMUS PREMIER MR 2.50X12MM     10/27/2013. LHC: 3 vessel Disease, Stent x2:  Proximal LAD:   Stroke [facility])    Past Surgical History:  Procedure Laterality Date   ABDOMINAL HYSTERECTOMY     BACK SURGERY     CARDIAC CATHETERIZATION     CORONARY/GRAFT ACUTE MI REVASCULARIZATION N/A 01/25/2020   Procedure: Coronary/Graft Acute MI Revascularization;  Surgeon: [name], MD;  Location: [facility] INVASIVE CV LAB;  Service: Cardiovascular;  Laterality: N/A;   FRACTURE SURGERY     IR CATHETER TUBE CHANGE  08/10/2021   LEFT HEART CATH AND CORONARY ANGIOGRAPHY N/A 01/25/2020   Procedure: LEFT HEART CATH AND CORONARY ANGIOGRAPHY;  Surgeon: [name], MD;  Location: [facility] INVASIVE CV LAB;  Service: Cardiovascular;  Laterality: N/A;   SKIN GRAFT Right    TOTAL ABDOMINAL HYSTERECTOMY Bilateral      Allergies[1]  Outpatient Encounter Medications as of 08/18/2024  Medication Sig   aspirin  EC 81 MG tablet Take 81 mg by mouth daily.   Cholecalciferol (VITAMIN D3) 1.25 MG (50000 UT) CAPS Take 1 capsule by mouth once a week.  every Mon for Supplement   Dextromethorphan-Benzocaine (COUGH/SORE THROAT LOZENGES MT) Use as directed 1 drop in the mouth or throat every 4 (four) hours as needed.   diazepam  (VALIUM ) 10 MG tablet Take 10 mg by mouth 4 (four) times daily.   feeding supplement (ENSURE PLUS HIGH PROTEIN) LIQD Take 237 mLs by mouth 3 (three) times daily.   fluticasone  (FLONASE ) 50 MCG/ACT nasal spray Place 1 spray into both nostrils daily as needed for allergies or rhinitis.   furosemide  (LASIX ) 40 MG tablet Take 1 tablet (40 mg total) by mouth 2 (two) times daily.   ibuprofen  (ADVIL ) 800 MG tablet Take 800 mg by mouth 2 (two) times daily as needed for mild pain (pain score 1-3) or moderate pain (pain score 4-6).   ipratropium-albuterol  (DUONEB) 0.5-2.5 (3) MG/3ML SOLN Take 3 mLs by nebulization every 4 (four) hours as needed.   isosorbide  mononitrate (IMDUR ) 60 MG 24 hr tablet Take 60 mg by mouth daily.   lansoprazole (PREVACID) 15 MG capsule Take 15 mg by mouth 2 (two) times daily before a meal.   levothyroxine  (SYNTHROID ) 25 MCG tablet Take 25 mcg by mouth daily before breakfast.   magnesium  hydroxide (MILK OF MAGNESIA) 400 MG/5ML suspension Take 5 mLs by mouth every 6 (six) hours as needed for mild constipation.   Menthol (COUGH DROPS) 2.7 MG LOZG 1 tablet by Transmucosal route every 4 (four) hours as needed.   metoprolol  succinate (TOPROL -XL) 25 MG 24 hr tablet Take 0.5 tablets (12.5 mg total) by mouth daily.   nitroGLYCERIN  (NITROSTAT ) 0.4 MG SL tablet Place 0.4 mg under the tongue every 5 (five) minutes x 3 doses as needed for chest pain.    OLANZapine  (ZYPREXA ) 5 MG tablet Take 5 mg by mouth daily.   ondansetron  (ZOFRAN -ODT) 4 MG disintegrating tablet Take 4 mg by mouth every 4  (four) hours as needed for nausea or vomiting.   OXYGEN Inhale into the lungs. 2lpm   polyethylene glycol (MIRALAX  / GLYCOLAX ) 17 g packet Take 17 g by mouth daily.   potassium chloride  (KLOR-CON ) 20 MEQ packet Take 40 mEq by mouth daily.   promethazine  (PHENERGAN ) 12.5 MG suppository Place 1 suppository (12.5 mg total) rectally every 6 (six) hours as needed for refractory nausea / vomiting.   ranolazine  (RANEXA ) 500 MG 12 hr tablet Take 500 mg by mouth 2 (two) times daily.   rosuvastatin  (CRESTOR ) 20 MG tablet Take 1 tablet (20 mg total) by mouth daily.   senna-docusate (SENOKOT-S) 8.6-50 MG tablet Take 2 tablets by mouth at bedtime.   spironolactone  (ALDACTONE ) 25 MG tablet Take 0.5 tablets (12.5 mg total) by mouth daily.   Vibegron  (GEMTESA ) 75 MG TABS Take 75 mg by mouth daily.   Zinc Oxide (TRIPLE PASTE) 12.8 % ointment Apply 1 Application topically as needed for irritation.  amoxicillin -clavulanate (AUGMENTIN ) 875-125 MG tablet Take 1 tablet by mouth 2 (two) times daily. (Patient not taking: Reported on 08/18/2024)   diazepam  (VALIUM ) 10 MG tablet Take 1 tablet (10 mg total) by mouth in the morning, at noon, in the evening, and at bedtime. (Patient not taking: Reported on 08/18/2024)   mupirocin  ointment (BACTROBAN ) 2 % Place 1 Application into the nose 2 (two) times daily. (Patient not taking: Reported on 08/18/2024)   No facility-administered encounter medications on file as of 08/18/2024.    Review of Systems  Constitutional:  Negative for fatigue and fever.  HENT:  Negative for sore throat and trouble swallowing.   Eyes:  Negative for visual disturbance.  Respiratory:  Negative for cough and shortness of breath.   Cardiovascular:  Negative for chest pain and leg swelling.  Gastrointestinal:  Negative for abdominal distention and abdominal pain.  Genitourinary:  Negative for hematuria.  Musculoskeletal:  Positive for gait problem.  Skin:  Negative for wound.  Neurological:  Positive  for weakness. Negative for dizziness and headaches.  Psychiatric/Behavioral:  Positive for confusion and dysphoric mood. Negative for sleep disturbance. The patient is nervous/anxious.     Immunization History  Administered Date(s) Administered   Influenza-Unspecified 04/28/2011, 06/14/2012   Moderna Sars-Covid-2 Vaccination 09/08/2019, 10/06/2019, 06/11/2020   Tdap 09/03/2013, 11/15/2016   Zoster Recombinant(Shingrix) 12/08/2022, 03/10/2023   Pertinent  Health Maintenance Due  Topic Date Due   Colonoscopy  10/10/2024 (Originally 07/09/2006)   OPHTHALMOLOGY EXAM  10/22/2024 (Originally 07/10/1971)   Mammogram  11/29/2024   HEMOGLOBIN A1C  12/10/2024   FOOT EXAM  05/21/2025   Influenza Vaccine  Discontinued      12/07/2022    2:19 PM 12/27/2023   11:28 AM 05/25/2024    9:46 AM 06/10/2024   12:47 PM 07/28/2024   12:13 PM  Fall Risk  Falls in the past year? Exclusion - non ambulatory 0 0 0 Exclusion - non ambulatory  Was there an injury with Fall?   0  0    Fall Risk Category Calculator   0 0   Patient at Risk for Falls Due to   History of fall(s) History of fall(s);Impaired balance/gait;Impaired mobility;Impaired vision   Fall risk Follow up   Falls evaluation completed Falls evaluation completed      Data saved with a previous flowsheet row definition   Functional Status Survey:    Vitals:   08/18/24 0943  BP: 103/71  Pulse: 74  Resp: 13  Temp: 98 F (36.7 C)  SpO2: 99%  Weight: 165 lb (74.8 kg)  Height: 5' 1 (1.549 m)   Body mass index is 31.18 kg/m. Physical Exam Vitals reviewed.  Constitutional:      General: She is not in acute distress.    Appearance: She is obese.  HENT:     Head: Normocephalic.  Eyes:     General:        Right eye: No discharge.        Left eye: No discharge.  Cardiovascular:     Rate and Rhythm: Normal rate and regular rhythm.     Pulses: Normal pulses.     Heart sounds: Normal heart sounds.  Pulmonary:     Effort: Pulmonary  effort is normal. No respiratory distress.     Breath sounds: Normal breath sounds. No wheezing or rales.  Abdominal:     General: Bowel sounds are normal. There is no distension.     Palpations: Abdomen is soft.  Tenderness: There is no abdominal tenderness.  Genitourinary:    Comments: Suprapubic foley insertion site with mild leakage> improved after foley advanced, urine in foley yellow/clear  Musculoskeletal:     Cervical back: Neck supple.     Right lower leg: Edema present.     Left lower leg: Edema present.     Comments: Non pitting  Skin:    General: Skin is warm.     Capillary Refill: Capillary refill takes less than 2 seconds.  Neurological:     General: No focal deficit present.     Mental Status: She is alert. Mental status is at baseline.     Motor: Weakness present.     Gait: Gait abnormal.  Psychiatric:        Mood and Affect: Mood normal.     Comments: Anxious at beginning of encounter     Labs reviewed: Recent Labs    06/05/24 1433 06/06/24 1240 07/12/24 0404 07/22/24 0358 07/22/24 0530 07/22/24 1726 07/25/24 1013 07/28/24 0000 08/01/24 0000 08/07/24 0000 08/14/24 0000  NA  --    < > 132* 134*  --   --  134*   < > 138 140 140  K  --    < > 3.5 3.4*  --   --  3.2*   < > 4.2 4.0 4.1  CL  --    < > 85* 92*  --   --  96*   < > 97* 98* 101  CO2  --    < > 35* 24  --   --  25   < > 33* 31* 28*  GLUCOSE  --    < > 145* 292*  --   --  181*  --   --   --   --   BUN  --    < > 15 9  --   --  20   < > 14 10 12   CREATININE  --    < > 0.88 1.13*  --    < > 0.93   < > 1.1 1.1 1.1  CALCIUM   --    < > 8.9 9.4  --   --  9.0   < > 9.3 8.9 8.7  MG 1.8  --   --   --  2.0  --   --   --   --   --   --    < > = values in this interval not displayed.   Recent Labs    07/07/24 0340 07/08/24 0447 07/22/24 0358  AST 17 13* 18  ALT 6 7 9   ALKPHOS 104 104 101  BILITOT 0.6 0.5 0.6  PROT 6.9 7.0 7.7  ALBUMIN 4.0 4.2 4.3   Recent Labs    06/12/24 0150  06/13/24 0522 07/07/24 0340 07/07/24 1020 07/22/24 0358 07/22/24 1726 07/25/24 1013  WBC 15.6*   < > 12.2*   < > 16.8* 10.7* 6.0  NEUTROABS 13.1*  --  10.2*  --  14.3*  --   --   HGB 11.6*   < > 12.7   < > 14.2 11.6* 11.8*  HCT 34.7*   < > 38.5   < > 45.3 36.3 36.8  MCV 83.2   < > 82.4   < > 85.5 84.8 84.2  PLT 323   < > 268   < > 551* 369 291   < > = values in this interval not displayed.   Lab Results  Component Value Date   TSH 0.976 07/08/2024   Lab Results  Component Value Date   HGBA1C 6.1 (H) 06/12/2024   Lab Results  Component Value Date   CHOL 196 06/05/2024   HDL 69 06/05/2024   LDLCALC 113 (H) 06/05/2024   TRIG 68 06/05/2024   CHOLHDL 2.8 06/05/2024    Significant Diagnostic Results in last 30 days:  DG Chest Port 1 View Result Date: 07/25/2024 CLINICAL DATA:  No Mon yeah. EXAM: PORTABLE CHEST 1 VIEW COMPARISON:  Radiograph and CT 07/22/2024 FINDINGS: The heart is upper normal in size. Stable mediastinal contours. Aortic atherosclerosis. There are coronary stents. Improvement in heterogeneous bilateral lung opacities. Mild patchy residual persists at the bases. Elevated right hemidiaphragm. No large pleural effusion. No visible pneumothorax. IMPRESSION: Improvement in heterogeneous bilateral lung opacities from prior exam. Mild patchy residual persists at the bases. Electronically Signed   By: Andrea Gasman M.D.   On: 07/25/2024 14:14   CT Angio Chest PE W and/or Wo Contrast Result Date: 07/22/2024 CLINICAL DATA:  Shortness of breath. EXAM: CT ANGIOGRAPHY CHEST WITH CONTRAST TECHNIQUE: Multidetector CT imaging of the chest was performed using the standard protocol during bolus administration of intravenous contrast. Multiplanar CT image reconstructions and MIPs were obtained to evaluate the vascular anatomy. RADIATION DOSE REDUCTION: This exam was performed according to the departmental dose-optimization program which includes automated exposure control,  adjustment of the mA and/or kV according to patient size and/or use of iterative reconstruction technique. CONTRAST:  75mL OMNIPAQUE  IOHEXOL  350 MG/ML SOLN COMPARISON:  January 24, 2020 FINDINGS: Cardiovascular: Satisfactory opacification of the pulmonary arteries to the segmental level. No evidence of pulmonary embolism. There is mild cardiomegaly with marked severity coronary artery calcification no pericardial effusion. Mediastinum/Nodes: No enlarged mediastinal, hilar, or axillary lymph nodes. Thyroid gland, trachea, and esophagus demonstrate no significant findings. Lungs/Pleura: Moderate to marked severity multifocal infiltrates are seen throughout both lungs. Mild compressive atelectasis is also noted within the posterior aspect of the bilateral lower lobes. There are small bilateral pleural effusions. No pneumothorax is identified. Upper Abdomen: Ill-defined gallstones are seen within the gallbladder lumen. Musculoskeletal: Postoperative changes are seen within the lower cervical spine. No acute osseous abnormalities are identified. Review of the MIP images confirms the above findings. IMPRESSION: 1. No evidence of pulmonary embolism. 2. Moderate to marked severity multifocal bilateral infiltrates. 3. Small bilateral pleural effusions. 4. Cholelithiasis. Electronically Signed   By: Suzen Dials M.D.   On: 07/22/2024 11:57   DG Chest Port 1 View Result Date: 07/22/2024 EXAM: 1 VIEW(S) XRAY OF THE CHEST 07/22/2024 04:21:00 AM COMPARISON: Portable chest, 07/07/2024. CLINICAL HISTORY: Questionable sepsis - evaluate for abnormality. FINDINGS: LINES, TUBES AND DEVICES: Monitor wires are present. LUNGS AND PLEURA: Increased central vascular congestion and moderate diffuse interstitial edema with a basal gradient. Increased perihilar haziness which is probably ground glass edema. Small pleural effusions are forming. No pneumothorax. No other focal airspace process is seen. HEART AND MEDIASTINUM: Mild  cardiomegaly. Calcification in the transverse aorta. Calcifications and stents in the LAD coronary artery. Unremarkable mediastinum. BONES AND SOFT TISSUES: Artifact from overlying clothing. No acute osseous abnormality. Partially visible lower cervical spine fusion plating. IMPRESSION: 1. Increased central vascular congestion and moderate diffuse interstitial edema with basal gradient, with probable ground-glass edema centrally. 2. Small pleural effusions. 3. Mild cardiomegaly. Electronically signed by: Francis Quam MD 07/22/2024 04:55 AM EST RP Workstation: [id number]    Assessment/Plan 1. Indwelling Foley catheter present (Primary) - followed by urology> recently exchanged  -  leaking x 2 days - improved after foley advanced  - if no improvement, contact Urology   2. GAD (generalized anxiety disorder) - anxious at beginning of encounter - on valium  and Zyprexa  - consider GDR in next few weeks once baseline has returned  3. Delusional disorder (HCC) - see above - cont Zyprexa   4. Chronic systolic CHF (congestive heart failure) (HCC) - weight stable> current 165 lbs - lung sounds clear - cont weekly weights - cont spirolactone and furosemide      Family/ staff Communication: plan discussed with patient and nurse  Labs/tests ordered:  none        [1]  Allergies Allergen Reactions   Levaquin [Levofloxacin In D5w] Anaphylaxis and Swelling   Iodinated Contrast Media Itching    Severe itching   Other     Dust mites and opiates (all of them)   Latex Dermatitis   "

## 2024-08-18 NOTE — Telephone Encounter (Signed)
 Pts LPN Damien called in with c/o leaking around SP Tube. Pt had it changed 1/15 with PA. She stated the NP had tried to flush and position it so it would drain better. She states about 20 ml in bag today but she is soaking her briefs mainly from it leaking and she has c/o bladder spasms. We stated that she could see about coming back in for a catheter change and this might not make it better. LPN stated she will watch it for a few days and see if the bladder spasms decrease then hopefully the leaking gets better. She will call us  back if they have any other questions or they feel it needs to be changed sooner.

## 2024-08-20 ENCOUNTER — Telehealth: Payer: Self-pay | Admitting: Physician Assistant

## 2024-08-20 NOTE — Telephone Encounter (Signed)
 Patient had tube changed last week.  [facility] is supposed to record patient's output and tube isn't draining properly, it's draining right into diaper.  [phone]  Damien

## 2024-08-20 NOTE — Telephone Encounter (Signed)
 Made appt to exchange catheter tomorrow with PA

## 2024-08-21 ENCOUNTER — Ambulatory Visit: Admitting: Urology

## 2024-08-21 VITALS — BP 111/84 | HR 86

## 2024-08-21 DIAGNOSIS — T83010A Breakdown (mechanical) of cystostomy catheter, initial encounter: Secondary | ICD-10-CM

## 2024-08-21 DIAGNOSIS — N3289 Other specified disorders of bladder: Secondary | ICD-10-CM

## 2024-08-21 DIAGNOSIS — T83030A Leakage of cystostomy catheter, initial encounter: Secondary | ICD-10-CM

## 2024-08-21 DIAGNOSIS — R339 Retention of urine, unspecified: Secondary | ICD-10-CM | POA: Diagnosis not present

## 2024-08-21 DIAGNOSIS — N390 Urinary tract infection, site not specified: Secondary | ICD-10-CM

## 2024-08-21 MED ORDER — NITROFURANTOIN MONOHYD MACRO 100 MG PO CAPS: 100.0000 mg | ORAL_CAPSULE | Freq: Two times a day (BID) | ORAL | 0 refills | Status: AC

## 2024-08-21 NOTE — Progress Notes (Signed)
 Suprapubic Cath Change  Patient is present today for a suprapubic catheter change due to urinary retention.  8ml of water was drained from the balloon, a 18 FR Silastic foley cath was removed from the tract with out difficulty.  Site was cleaned and prepped in a sterile fashion with betadine.  A 18 FR Silastic foley cath was replaced into the tract no complications were noted. Urine return was noted, 10 ml of sterile water was inflated into the balloon and a night bag was attached for drainage.  Patient tolerated well.   Performed by: CLOTILDA CORNWALL, PA-C and Andrea DELENA Kirks, LPN and Gabby Aiden, PA-C   Follow up: Return for keep follow up with Sam .   She states she is having significant leakage from the SPT site.  Her staff have denied any sediment or hematuria that would cause clogging of the catheter.  She has had an increase in her bladder spasms.  She is taking Gemtesa  75 mg daily.  She is on fluid restrictions, so she can only have 1200 mL of fluid daily.  She was unable to stay, so that we could collect a urinalysis to send for culture.  I will prescribe Macrobid  100 mg twice daily for 7 days to see if a urinary tract infection is responsible for the increase of bladder spasms.  Also schedule renal ultrasound to rule out any stone material or other anatomic abnormalities that may be contributing to her increase in bladder spasms and leakage.

## 2024-08-22 ENCOUNTER — Encounter: Payer: Self-pay | Admitting: Orthopedic Surgery

## 2024-08-22 ENCOUNTER — Non-Acute Institutional Stay (SKILLED_NURSING_FACILITY): Payer: Self-pay | Admitting: Orthopedic Surgery

## 2024-08-22 DIAGNOSIS — K219 Gastro-esophageal reflux disease without esophagitis: Secondary | ICD-10-CM | POA: Diagnosis not present

## 2024-08-22 DIAGNOSIS — E6609 Other obesity due to excess calories: Secondary | ICD-10-CM | POA: Diagnosis not present

## 2024-08-22 DIAGNOSIS — I5022 Chronic systolic (congestive) heart failure: Secondary | ICD-10-CM

## 2024-08-22 DIAGNOSIS — I10 Essential (primary) hypertension: Secondary | ICD-10-CM | POA: Diagnosis not present

## 2024-08-22 DIAGNOSIS — E039 Hypothyroidism, unspecified: Secondary | ICD-10-CM | POA: Diagnosis not present

## 2024-08-22 DIAGNOSIS — H47619 Cortical blindness, unspecified side of brain: Secondary | ICD-10-CM

## 2024-08-22 DIAGNOSIS — F22 Delusional disorders: Secondary | ICD-10-CM | POA: Diagnosis not present

## 2024-08-22 DIAGNOSIS — N1831 Chronic kidney disease, stage 3a: Secondary | ICD-10-CM

## 2024-08-22 DIAGNOSIS — E66811 Obesity, class 1: Secondary | ICD-10-CM | POA: Diagnosis not present

## 2024-08-22 DIAGNOSIS — I25110 Atherosclerotic heart disease of native coronary artery with unstable angina pectoris: Secondary | ICD-10-CM | POA: Diagnosis not present

## 2024-08-22 DIAGNOSIS — E782 Mixed hyperlipidemia: Secondary | ICD-10-CM | POA: Diagnosis not present

## 2024-08-22 DIAGNOSIS — I69354 Hemiplegia and hemiparesis following cerebral infarction affecting left non-dominant side: Secondary | ICD-10-CM

## 2024-08-22 NOTE — Progress Notes (Signed)
 " Location:  Other [place].  Nursing Home Room Number: [facility] [id number] Place of Service:  SNF [phone]) Provider:  Greig Cluster, NP  PCP: [name], DO  Patient Care Team: [name], DO as PCP - General (Internal Medicine) [name], MD as PCP - Cardiology (Cardiology)  Extended Emergency Contact Information Primary Emergency Contact: Demarco [facility]  [place] Home Phone: [phone] Mobile Phone: [phone] Relation: Sister Secondary Emergency Contact: [name] Address: [id number]          [place], [place] [address] [place] Home Phone: [phone] Relation: Mother  Code Status:  DNR Goals of care: Advanced Directive information    08/18/2024   10:09 AM  Advanced Directives  Does Patient Have a Medical Advance Directive? Yes  Type of Advance Directive Out of facility DNR (pink MOST or yellow form)  Does patient want to make changes to medical advance directive? No - Patient declined     Chief Complaint  Patient presents with   Medical Management of Chronic Issues    Medical Management of Chronic Issues.     HPI:  Pt is a 64 y.o. female seen today for medical management of chronic diseases.    She currently resides on the skilled nursing unit at [facility]. PMH: NSTEMI, CAD, DVT, PE, HTN, HFrEF, migraines, OSA, GERD, hypothyroidism, stroke with hemiplegia, myalgias, cervical intraepithelial glandular neoplasia, obesity, suprapubic catheter, anxiety and psychosis.   She currently resides on the skilled nursing unit at [facility]. PMH: NSTEMI, CAD, DVT, PE, HTN, HFrEF, migraines, OSA, GERD, hypothyroidism, stroke with hemiplegia, myalgias, cervical intraepithelial glandular neoplasia, obesity, suprapubic catheter, anxiety and psychosis.    CHF- hospitalized 12/08-12/13 & 12/23-12/27, last BNP was 9313, EF 20-25%, remains on furosemide /KCL and spirolactone  HTN- BUN/creat 12/1.1 08/14/2024 , see trends below,  remains on metoprolol  HLD- LDL 113 06/05/2024, remains on rosuvastatin  CAD- remains on asa isosorbide  and ranexa , did not tolerate beta blocker in past CKD- GFR 57 ( 01/15)> was 60 (01/08) H/o CVA with spastic Hemiplegia- CVA 2015, blind, left sided paralysis, muscle spasms, CT head noted old bilateral PCA distribution infarcts 08/2022, remains on asa and statin and valium  Hypothyroidism- TSH 0.976 07/08/2024, remains on levothyroxine  Suprapubic catheter- 01/22 urology evaluation due to low UOP> foley exchanged> improved UOP> started on Macrobid  x 7 days, remains in Gemtesa  Delusional disorder- no changes in mood, remains on Zyprexa  GERD- hgb 11.8 07/25/2024, remains on lansoprazole Obesity- BMI 31.18 Blindness Hyponatremia- Na+ 140 06/14/2024, remains on fluid restriction  No concerns today per patient and nurse. She reports improved lower abdomen/bladder pain since suprapubic foley was exchanged. Recent BIMS 11/15 08/20/2024.  Recent weights:  01/21- 165 lbs  01/14- 165 lbs  01/07- 161.6 lbs  12/13- 178.8 lbs     Past Medical History:  Diagnosis Date   Acute ST elevation myocardial infarction (STEMI) of inferolateral wall (HCC) 01/25/2020   Blind    Cervical intraepithelial glandular neoplasia 02/07/2011   Cholecystitis 04/22/2021   DVT (deep venous thrombosis) (HCC) 04/03/2012   History of migraine headaches    History of pulmonary embolism 04/02/2012   September 2013 after knee injury- hyperextension- . U/S of lower extremity revealed DVT in L leg. CTA revealed bilateral lower lobe segmental PEs and R upper lobe segmental PE. Patient was started on lovenox  and transferred upstairs. Cardiac enzymes were negative x1. NOAC for 7 months.         History of stroke 10/09/2021   Hyperlipemia  Myocardial infarction [facility])    NSTEMI (non-ST elevated myocardial infarction) (HCC) 10/27/2013   Formatting of this note might be different from the original.  01/08/2014: LHC: 2nd Marginal:  99% (pre) to 0% (post); STENT, PROMUS PREMIER MR 2.25X12MM     12/04/2013: LHC: Distal LAD: 70% (pre) to 0% (post) CATH, MINI TREK RX 2.0X20MM STENT, PROMUS PREMIER MR 2.25X28MM; Mid LAD: 70% (pre) to 0% (post), STENT, PROMUS PREMIER MR 2.50X12MM     10/27/2013. LHC: 3 vessel Disease, Stent x2:  Proximal LAD:   Stroke [facility])    Past Surgical History:  Procedure Laterality Date   ABDOMINAL HYSTERECTOMY     BACK SURGERY     CARDIAC CATHETERIZATION     CORONARY/GRAFT ACUTE MI REVASCULARIZATION N/A 01/25/2020   Procedure: Coronary/Graft Acute MI Revascularization;  Surgeon: [name], MD;  Location: [facility] INVASIVE CV LAB;  Service: Cardiovascular;  Laterality: N/A;   FRACTURE SURGERY     IR CATHETER TUBE CHANGE  08/10/2021   LEFT HEART CATH AND CORONARY ANGIOGRAPHY N/A 01/25/2020   Procedure: LEFT HEART CATH AND CORONARY ANGIOGRAPHY;  Surgeon: [name], MD;  Location: [facility] INVASIVE CV LAB;  Service: Cardiovascular;  Laterality: N/A;   SKIN GRAFT Right    TOTAL ABDOMINAL HYSTERECTOMY Bilateral     Allergies[1]  Outpatient Encounter Medications as of 08/22/2024  Medication Sig   aspirin  EC 81 MG tablet Take 81 mg by mouth daily.   Cholecalciferol (VITAMIN D3) 1.25 MG (50000 UT) CAPS Take 1 capsule by mouth once a week.  every Mon for Supplement   Dextromethorphan-Benzocaine (COUGH/SORE THROAT LOZENGES MT) Use as directed 1 drop in the mouth or throat every 4 (four) hours as needed.   diazepam  (VALIUM ) 10 MG tablet Take 10 mg by mouth 4 (four) times daily.   feeding supplement (ENSURE PLUS HIGH PROTEIN) LIQD Take 237 mLs by mouth 3 (three) times daily.   fluticasone  (FLONASE ) 50 MCG/ACT nasal spray Place 1 spray into both nostrils daily as needed for allergies or rhinitis.   furosemide  (LASIX ) 40 MG tablet Take 1 tablet (40 mg total) by mouth 2 (two) times daily.   ibuprofen  (ADVIL ) 800 MG tablet Take 800 mg by mouth 2 (two) times daily as needed for mild pain (pain score 1-3) or  moderate pain (pain score 4-6).   ipratropium-albuterol  (DUONEB) 0.5-2.5 (3) MG/3ML SOLN Take 3 mLs by nebulization every 4 (four) hours as needed.   isosorbide  mononitrate (IMDUR ) 60 MG 24 hr tablet Take 60 mg by mouth daily.   lansoprazole (PREVACID) 15 MG capsule Take 15 mg by mouth 2 (two) times daily before a meal.   levothyroxine  (SYNTHROID ) 25 MCG tablet Take 25 mcg by mouth daily before breakfast.   magnesium  hydroxide (MILK OF MAGNESIA) 400 MG/5ML suspension Take 5 mLs by mouth every 6 (six) hours as needed for mild constipation.   Menthol (COUGH DROPS) 2.7 MG LOZG 1 tablet by Transmucosal route every 4 (four) hours as needed.   metoprolol  succinate (TOPROL -XL) 25 MG 24 hr tablet Take 0.5 tablets (12.5 mg total) by mouth daily.   nitrofurantoin , macrocrystal-monohydrate, (MACROBID ) 100 MG capsule Take 1 capsule (100 mg total) by mouth every 12 (twelve) hours.   nitroGLYCERIN  (NITROSTAT ) 0.4 MG SL tablet Place 0.4 mg under the tongue every 5 (five) minutes x 3 doses as needed for chest pain.    OLANZapine  (ZYPREXA ) 5 MG tablet Take 5 mg by mouth daily.   ondansetron  (ZOFRAN -ODT) 4 MG disintegrating tablet Take 4 mg by  mouth every 4 (four) hours as needed for nausea or vomiting.   OXYGEN Inhale into the lungs. 2lpm   polyethylene glycol (MIRALAX  / GLYCOLAX ) 17 g packet Take 17 g by mouth daily.   potassium chloride  (KLOR-CON ) 20 MEQ packet Take 40 mEq by mouth daily.   promethazine  (PHENERGAN ) 12.5 MG suppository Place 1 suppository (12.5 mg total) rectally every 6 (six) hours as needed for refractory nausea / vomiting.   ranolazine  (RANEXA ) 500 MG 12 hr tablet Take 500 mg by mouth 2 (two) times daily.   rosuvastatin  (CRESTOR ) 20 MG tablet Take 1 tablet (20 mg total) by mouth daily.   senna-docusate (SENOKOT-S) 8.6-50 MG tablet Take 2 tablets by mouth at bedtime.   spironolactone  (ALDACTONE ) 25 MG tablet Take 0.5 tablets (12.5 mg total) by mouth daily.   Vibegron  (GEMTESA ) 75 MG TABS Take  75 mg by mouth daily.   Zinc Oxide (TRIPLE PASTE) 12.8 % ointment Apply 1 Application topically as needed for irritation.   No facility-administered encounter medications on file as of 08/22/2024.    Review of Systems  Constitutional:  Negative for fatigue and fever.  HENT:  Negative for sore throat and trouble swallowing.     Immunization History  Administered Date(s) Administered   Influenza-Unspecified 04/28/2011, 06/14/2012   Moderna Sars-Covid-2 Vaccination 09/08/2019, 10/06/2019, 06/11/2020   Tdap 09/03/2013, 11/15/2016   Zoster Recombinant(Shingrix) 12/08/2022, 03/10/2023   Pertinent  Health Maintenance Due  Topic Date Due   Colonoscopy  10/10/2024 (Originally 07/09/2006)   OPHTHALMOLOGY EXAM  10/22/2024 (Originally 07/10/1971)   Mammogram  11/29/2024   HEMOGLOBIN A1C  12/10/2024   FOOT EXAM  05/21/2025   Influenza Vaccine  Discontinued      12/27/2023   11:28 AM 05/25/2024    9:46 AM 06/10/2024   12:47 PM 07/28/2024   12:13 PM 08/18/2024    1:40 PM  Fall Risk  Falls in the past year? 0 0 0 Exclusion - non ambulatory Exclusion - non ambulatory  Was there an injury with Fall?  0  0     Fall Risk Category Calculator  0 0    Patient at Risk for Falls Due to  History of fall(s) History of fall(s);Impaired balance/gait;Impaired mobility;Impaired vision    Fall risk Follow up  Falls evaluation completed Falls evaluation completed       Data saved with a previous flowsheet row definition   Functional Status Survey:    Vitals:   08/22/24 0921  BP: (!) 160/97  Pulse: 83  Resp: 17  Temp: (!) 97.4 F (36.3 C)  SpO2: 96%  Weight: 165 lb (74.8 kg)  Height: 5' 1 (1.549 m)   Body mass index is 31.18 kg/m. Physical Exam Vitals reviewed.  Constitutional:      General: She is not in acute distress.    Appearance: She is obese.  HENT:     Head: Normocephalic.  Eyes:     General:        Right eye: No discharge.        Left eye: No discharge.  Cardiovascular:      Rate and Rhythm: Normal rate and regular rhythm.     Pulses: Normal pulses.     Heart sounds: Normal heart sounds.  Pulmonary:     Effort: Pulmonary effort is normal. No respiratory distress.     Breath sounds: Normal breath sounds. No wheezing or rales.     Comments: 2 liters oxygen Abdominal:     General: Bowel sounds are normal.  There is no distension.     Palpations: Abdomen is soft.     Tenderness: There is no abdominal tenderness.  Genitourinary:    Comments: Suprapubic foley, UOP adequate, urine yellow/clear Musculoskeletal:     Cervical back: Neck supple.     Right lower leg: No edema.     Left lower leg: No edema.  Skin:    General: Skin is warm.     Capillary Refill: Capillary refill takes less than 2 seconds.  Neurological:     General: No focal deficit present.     Mental Status: She is alert. Mental status is at baseline.     Motor: Weakness present.     Gait: Gait abnormal.  Psychiatric:        Mood and Affect: Mood normal.     Labs reviewed: Recent Labs    06/05/24 1433 06/06/24 1240 07/12/24 0404 07/22/24 0358 07/22/24 0530 07/22/24 1726 07/25/24 1013 07/28/24 0000 08/01/24 0000 08/07/24 0000 08/14/24 0000  NA  --    < > 132* 134*  --   --  134*   < > 138 140 140  K  --    < > 3.5 3.4*  --   --  3.2*   < > 4.2 4.0 4.1  CL  --    < > 85* 92*  --   --  96*   < > 97* 98* 101  CO2  --    < > 35* 24  --   --  25   < > 33* 31* 28*  GLUCOSE  --    < > 145* 292*  --   --  181*  --   --   --   --   BUN  --    < > 15 9  --   --  20   < > 14 10 12   CREATININE  --    < > 0.88 1.13*  --    < > 0.93   < > 1.1 1.1 1.1  CALCIUM   --    < > 8.9 9.4  --   --  9.0   < > 9.3 8.9 8.7  MG 1.8  --   --   --  2.0  --   --   --   --   --   --    < > = values in this interval not displayed.   Recent Labs    07/07/24 0340 07/08/24 0447 07/22/24 0358  AST 17 13* 18  ALT 6 7 9   ALKPHOS 104 104 101  BILITOT 0.6 0.5 0.6  PROT 6.9 7.0 7.7  ALBUMIN 4.0 4.2 4.3    Recent Labs    06/12/24 0150 06/13/24 0522 07/07/24 0340 07/07/24 1020 07/22/24 0358 07/22/24 1726 07/25/24 1013  WBC 15.6*   < > 12.2*   < > 16.8* 10.7* 6.0  NEUTROABS 13.1*  --  10.2*  --  14.3*  --   --   HGB 11.6*   < > 12.7   < > 14.2 11.6* 11.8*  HCT 34.7*   < > 38.5   < > 45.3 36.3 36.8  MCV 83.2   < > 82.4   < > 85.5 84.8 84.2  PLT 323   < > 268   < > 551* 369 291   < > = values in this interval not displayed.   Lab Results  Component Value Date   TSH 0.976 07/08/2024  Lab Results  Component Value Date   HGBA1C 6.1 (H) 06/12/2024   Lab Results  Component Value Date   CHOL 196 06/05/2024   HDL 69 06/05/2024   LDLCALC 113 (H) 06/05/2024   TRIG 68 06/05/2024   CHOLHDL 2.8 06/05/2024    Significant Diagnostic Results in last 30 days:  DG Chest Port 1 View Result Date: 07/25/2024 CLINICAL DATA:  No Mon yeah. EXAM: PORTABLE CHEST 1 VIEW COMPARISON:  Radiograph and CT 07/22/2024 FINDINGS: The heart is upper normal in size. Stable mediastinal contours. Aortic atherosclerosis. There are coronary stents. Improvement in heterogeneous bilateral lung opacities. Mild patchy residual persists at the bases. Elevated right hemidiaphragm. No large pleural effusion. No visible pneumothorax. IMPRESSION: Improvement in heterogeneous bilateral lung opacities from prior exam. Mild patchy residual persists at the bases. Electronically Signed   By: Andrea Gasman M.D.   On: 07/25/2024 14:14    Assessment/Plan 1. Chronic systolic CHF (congestive heart failure) (HCC) (Primary) - ongoing - past BNP > 9000 - 2 hospitalizations 06/2024 - weight stable - lung sounds clear - cont furosemide /KCL, spirolactone  2. Essential hypertension - controlled with metoprolol   3. Mixed hyperlipidemia - LDL stable - cont rosuvastatin   4. Coronary artery disease involving native coronary artery of native heart with unstable angina pectoris (HCC) - cont asa and rosuvastatin   5. Stage 3a  chronic kidney disease (HCC) - encourage hydration with water - avoid NSAIDS  6. 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) - cont asa, rosuvastatin  and valium  - cont skilled nursing   7. Acquired hypothyroidism - TSH stable with levothyroxine   8. Delusional disorder (HCC) - do no recommend GDR of Zyprexa  due to recent hospitalizations  9. GERD without esophagitis - hgb stable with lansoprazole  10. Class 1 obesity due to excess calories with serious comorbidity and body mass index (BMI) of 31.0 to 31.9 in adult - sedentary lifestyle - unable to exercise due to hemiplegia  11. 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

## 2024-08-23 ENCOUNTER — Encounter: Payer: Self-pay | Admitting: Urology

## 2024-08-28 ENCOUNTER — Ambulatory Visit
Admission: RE | Admit: 2024-08-28 | Discharge: 2024-08-28 | Disposition: A | Discharge status: Home / Self Care | Source: Ambulatory Visit | Attending: Urology | Admitting: Urology

## 2024-08-28 DIAGNOSIS — N390 Urinary tract infection, site not specified: Secondary | ICD-10-CM | POA: Insufficient documentation

## 2024-09-01 ENCOUNTER — Ambulatory Visit: Payer: Self-pay | Admitting: Urology

## 2024-09-04 NOTE — Telephone Encounter (Signed)
 Spoke with patient POA, Marta Melena in regards to US  results. Advised that ultrasound was normal and did not show any swelling of the kidneys or anything that would cause repeated urinary tract infection.  Andrea Kirks LPN

## 2024-09-04 NOTE — Telephone Encounter (Signed)
-----   Message from [facility] sent at 09/01/2024 12:53 PM EST ----- Please let Victoria Medina know that her ultrasound was normal. There was no swelling of the kidneys and no blockage seen. The scan did not show anything that would cause her repeated urinary tract  infections, and it also did not show anything that would lead to the blockage of her suprapubic catheter.

## 2024-09-11 ENCOUNTER — Ambulatory Visit: Admitting: Physician Assistant

## 2024-09-15 ENCOUNTER — Ambulatory Visit: Admitting: Physician Assistant
